# Patient Record
Sex: Male | Born: 1950 | Race: Black or African American | Hispanic: No | Marital: Married | Smoking: Never smoker
Health system: Southern US, Community
[De-identification: ages and names within clinical notes are randomized; demographics above are authoritative.]

## PROBLEM LIST (undated history)

## (undated) ENCOUNTER — Encounter

## (undated) ENCOUNTER — Ambulatory Visit

## (undated) ENCOUNTER — Telehealth

## (undated) ENCOUNTER — Ambulatory Visit: Payer: Medicare (Managed Care)

## (undated) ENCOUNTER — Ambulatory Visit
Payer: Medicare (Managed Care) | Attending: Rehabilitative and Restorative Service Providers" | Primary: Rehabilitative and Restorative Service Providers"

## (undated) ENCOUNTER — Inpatient Hospital Stay

## (undated) ENCOUNTER — Ambulatory Visit: Payer: MEDICARE | Attending: Family | Primary: Family

## (undated) ENCOUNTER — Encounter: Attending: Ambulatory Care | Primary: Ambulatory Care

## (undated) ENCOUNTER — Ambulatory Visit: Payer: MEDICARE

## (undated) ENCOUNTER — Encounter: Attending: Nephrology | Primary: Nephrology

## (undated) ENCOUNTER — Encounter: Payer: MEDICARE | Attending: Surgery | Primary: Surgery

## (undated) ENCOUNTER — Ambulatory Visit: Payer: MEDICARE | Attending: Surgery | Primary: Surgery

## (undated) ENCOUNTER — Telehealth: Attending: Family Medicine | Primary: Family Medicine

## (undated) ENCOUNTER — Ambulatory Visit: Payer: MEDICARE | Attending: Nephrology | Primary: Nephrology

## (undated) ENCOUNTER — Ambulatory Visit: Payer: Medicare (Managed Care) | Attending: Nephrology | Primary: Nephrology

## (undated) ENCOUNTER — Ambulatory Visit: Attending: Family | Primary: Family

## (undated) ENCOUNTER — Other Ambulatory Visit

## (undated) ENCOUNTER — Encounter: Payer: MEDICARE | Attending: Nephrology | Primary: Nephrology

## (undated) ENCOUNTER — Telehealth: Attending: Nephrology | Primary: Nephrology

## (undated) ENCOUNTER — Ambulatory Visit: Attending: Surgery | Primary: Surgery

## (undated) DIAGNOSIS — E119 Type 2 diabetes mellitus without complications: Secondary | ICD-10-CM

## (undated) DIAGNOSIS — N289 Disorder of kidney and ureter, unspecified: Secondary | ICD-10-CM

## (undated) HISTORY — PX: AV FISTULA PLACEMENT: SHX1204

## (undated) HISTORY — PX: KIDNEY SURGERY: SHX687

---
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
## 2004-07-12 ENCOUNTER — Ambulatory Visit: Payer: Self-pay | Admitting: Internal Medicine

## 2005-12-08 ENCOUNTER — Ambulatory Visit: Payer: Self-pay | Admitting: Internal Medicine

## 2014-09-01 ENCOUNTER — Emergency Department: Payer: Self-pay | Admitting: Emergency Medicine

## 2014-09-01 LAB — BASIC METABOLIC PANEL
ANION GAP: 16 (ref 7–16)
BUN: 98 mg/dL — ABNORMAL HIGH (ref 7–18)
CALCIUM: 7.8 mg/dL — AB (ref 8.5–10.1)
CO2: 23 mmol/L (ref 21–32)
Chloride: 105 mmol/L (ref 98–107)
Creatinine: 17.64 mg/dL — ABNORMAL HIGH (ref 0.60–1.30)
GFR CALC AF AMER: 4 — AB
GFR CALC NON AF AMER: 3 — AB
GLUCOSE: 106 mg/dL — AB (ref 65–99)
OSMOLALITY: 318 (ref 275–301)
Potassium: 5.2 mmol/L — ABNORMAL HIGH (ref 3.5–5.1)
Sodium: 144 mmol/L (ref 136–145)

## 2014-09-01 LAB — CBC
HCT: 29.1 % — AB (ref 40.0–52.0)
HGB: 9.4 g/dL — ABNORMAL LOW (ref 13.0–18.0)
MCH: 31 pg (ref 26.0–34.0)
MCHC: 32.3 g/dL (ref 32.0–36.0)
MCV: 96 fL (ref 80–100)
Platelet: 183 10*3/uL (ref 150–440)
RBC: 3.03 10*6/uL — ABNORMAL LOW (ref 4.40–5.90)
RDW: 13.6 % (ref 11.5–14.5)
WBC: 11.2 10*3/uL — AB (ref 3.8–10.6)

## 2014-09-01 LAB — TROPONIN I: Troponin-I: 0.03 ng/mL

## 2015-08-08 HISTORY — PX: NEPHRECTOMY: SHX65

## 2016-01-11 NOTE — Unmapped (Signed)
Pt called to reschedule his RUS that he missed.  Pt wants to come in 01/20/16.  Pt rescheduled at the [facility] due to availability.  Letter sent to patient and dialysis center.  Lattie Haw [phone]

## 2016-12-05 HISTORY — PX: NEPHRECTOMY: SHX65

## 2016-12-29 ENCOUNTER — Emergency Department: Payer: Medicare Other

## 2016-12-29 ENCOUNTER — Encounter: Payer: Self-pay | Admitting: Emergency Medicine

## 2016-12-29 ENCOUNTER — Emergency Department
Admission: EM | Admit: 2016-12-29 | Discharge: 2016-12-29 | Disposition: A | Discharge status: Home / Self Care | Payer: Medicare Other | Attending: Emergency Medicine | Admitting: Emergency Medicine

## 2016-12-29 DIAGNOSIS — Z7902 Long term (current) use of antithrombotics/antiplatelets: Secondary | ICD-10-CM | POA: Insufficient documentation

## 2016-12-29 DIAGNOSIS — R1084 Generalized abdominal pain: Secondary | ICD-10-CM | POA: Diagnosis not present

## 2016-12-29 DIAGNOSIS — Z905 Acquired absence of kidney: Secondary | ICD-10-CM | POA: Diagnosis not present

## 2016-12-29 DIAGNOSIS — E119 Type 2 diabetes mellitus without complications: Secondary | ICD-10-CM | POA: Diagnosis not present

## 2016-12-29 DIAGNOSIS — R079 Chest pain, unspecified: Secondary | ICD-10-CM | POA: Diagnosis not present

## 2016-12-29 DIAGNOSIS — R0789 Other chest pain: Secondary | ICD-10-CM

## 2016-12-29 HISTORY — DX: Disorder of kidney and ureter, unspecified: N28.9

## 2016-12-29 HISTORY — DX: Type 2 diabetes mellitus without complications: E11.9

## 2016-12-29 LAB — BASIC METABOLIC PANEL
Anion gap: 11 (ref 5–15)
BUN: 35 mg/dL — AB (ref 6–20)
CO2: 25 mmol/L (ref 22–32)
CREATININE: 8.2 mg/dL — AB (ref 0.61–1.24)
Calcium: 8.3 mg/dL — ABNORMAL LOW (ref 8.9–10.3)
Chloride: 98 mmol/L — ABNORMAL LOW (ref 101–111)
GFR calc Af Amer: 7 mL/min — ABNORMAL LOW (ref >60)
GFR, EST NON AFRICAN AMERICAN: 6 mL/min — AB (ref >60)
GLUCOSE: 157 mg/dL — AB (ref 65–99)
Potassium: 5.4 mmol/L — ABNORMAL HIGH (ref 3.5–5.1)
SODIUM: 134 mmol/L — AB (ref 135–145)

## 2016-12-29 LAB — CBC
HCT: 40.4 % (ref 40.0–52.0)
Hemoglobin: 13.4 g/dL (ref 13.0–18.0)
MCH: 33.6 pg (ref 26.0–34.0)
MCHC: 33.2 g/dL (ref 32.0–36.0)
MCV: 101.2 fL — AB (ref 80.0–100.0)
PLATELETS: 225 10*3/uL (ref 150–440)
RBC: 3.99 MIL/uL — ABNORMAL LOW (ref 4.40–5.90)
RDW: 14.5 % (ref 11.5–14.5)
WBC: 17.4 10*3/uL — ABNORMAL HIGH (ref 3.8–10.6)

## 2016-12-29 LAB — TROPONIN I
TROPONIN I: 0.03 ng/mL — AB (ref <0.03)
Troponin I: 0.03 ng/mL (ref <0.03)

## 2016-12-29 MED ORDER — ACETAMINOPHEN 500 MG PO TABS
ORAL_TABLET | ORAL | Status: AC
  Administered 2016-12-29: 1000 mg
  Filled 2016-12-29: qty 2

## 2016-12-29 MED ORDER — SODIUM CHLORIDE 0.9 % IV BOLUS (SEPSIS)
250.0000 mL | Freq: Once | INTRAVENOUS | Status: AC
  Administered 2016-12-29: 250 mL via INTRAVENOUS

## 2016-12-29 NOTE — ED Triage Notes (Signed)
Pt arrives via [facility] from dialysis. Pt was laid flat at one point during the procedure and felt some chest pressure. Pt denies any pain at this time. Pt also reports that he had his right [facility] and his left one was removed prior. Pt is alert and oriented x4 at this time with no cardiac symptoms.

## 2016-12-29 NOTE — ED Notes (Signed)
Green tube recollected

## 2016-12-29 NOTE — ED Notes (Signed)
Pt discharged home after verbalizing understanding of discharge instructions; nad noted. 

## 2016-12-29 NOTE — ED Provider Notes (Signed)
[facility] Emergency Department Provider Note  ____________________________________________   First MD Initiated Contact with Patient 12/29/16 0710     (approximate)  I have reviewed the triage vital signs and the nursing notes.   HISTORY  Chief Complaint Chest Pain   HPI Travis Saunders is a 66 y.o. male who comes for evaluation as he had a feeling of discomfort over his left chest that lasts about 20-30 minutes during his dialysis today.  Reports it is somewhat hard to describe feeling. He also reports he thought maybe it was because he had "a lot of fluid removed" last few days. He had surgery on Wednesday to have his right kidney removed due to concerns of cancer. He's been on dialysis for over 3 years, and this week he reports he's had for dialysis sessions due to his surgery. He does not make any urine.  He denies any fevers or chills. No shortness of breath. Denies any cough nausea vomiting. He does have discomfort over his right abdomen, but reports he expected to have pain in this area and he describes as being "sore". He has pain medicine at home, but reports he has not had to take any and does not wish for any medicine for pain or discomfort at this time.  Denies any ongoing symptoms of pain. No ongoing chest pain. He can't really describe it well but describes as a mild discomfort that went away. Denies any heavy chest pressure. No sharp chest pain. No shortness of breath   Past Medical History:  Diagnosis Date  . Diabetes mellitus without complication (HCC)   . Renal disorder     There are no active problems to display for this patient.   Past Surgical History:  Procedure Laterality Date  . KIDNEY SURGERY Bilateral     Prior to Admission medications   Medication Sig Start Date End Date Taking? Authorizing Provider  allopurinol (ZYLOPRIM) 100 MG tablet Take 100 mg by mouth daily. 10/31/16  Yes [provider]  midodrine  (PROAMATINE) 10 MG tablet Take 5 mg by mouth daily. 11/24/16  Yes [provider]  omeprazole (PRILOSEC) 40 MG capsule Take 40 mg by mouth daily. 12/26/16  Yes [provider]  oxyCODONE (OXY IR/ROXICODONE) 5 MG immediate release tablet Take 5 mg by mouth every 4 (four) hours as needed for pain. 12/28/16  Yes [provider]  pravastatin (PRAVACHOL) 40 MG tablet Take 40 mg by mouth daily. 10/31/16  Yes [provider]    Allergies Patient has no known allergies.  No family history on file.  Social History Social History  Substance Use Topics  . Smoking status: Not on file  . Smokeless tobacco: Not on file  . Alcohol use Not on file    Review of Systems Constitutional: No fever/chills Eyes: No visual changes. ENT: No sore throat. Cardiovascular: See history of present illness Respiratory: Denies shortness of breath. Gastrointestinal: See history of present illness  No nausea, no vomiting.  No diarrhea.  No constipation. Genitourinary: Does not make any urine Musculoskeletal: Negative for back pain. Skin: Negative for rash. Neurological: Negative for headaches, focal weakness or numbness.  10-point ROS otherwise negative.  ____________________________________________   PHYSICAL EXAM:  VITAL SIGNS: ED Triage Vitals  Enc Vitals Group     BP 12/29/16 0632 (!) 70/40     Pulse Rate 12/29/16 0632 (!) 106     Resp 12/29/16 0632 17     Temp 12/29/16 0632 98.5 F (36.9  C)     Temp Source 12/29/16 0632 Oral     SpO2 12/29/16 0632 95 %     Weight 12/29/16 0628 170 lb (77.1 kg)     Height 12/29/16 0628 5\' 6"  (1.676 m)     Head Circumference --      Peak Flow --      Pain Score 12/29/16 0628 0     Pain Loc --      Pain Edu? --      Excl. in GC? --     Constitutional: Alert and oriented. Well appearing and in no acute distress.He and his family members are all very pleasant. Eyes: Conjunctivae are normal. PERRL. EOMI. Head:  Atraumatic. Nose: No congestion/rhinnorhea. Mouth/Throat: Mucous membranes are moist.  Oropharynx non-erythematous. Neck: No stridor.   Cardiovascular: Normal rate, regular rhythm. Grossly normal heart sounds.  Good peripheral circulation. Respiratory: Normal respiratory effort.  No retractions. Lungs CTAB. Gastrointestinal: Soft and nontender except for tenderness along the right mid flank where he has a small about 1-1/2 inch incision that appears clean dry and intact. He has minimal discomfort to palpation of the left side, but reports moderate discomfort to palpation in the right flank. No focal right lower quadrant tenderness. No distention. His old midline surgical incision is well-healed. The new incision over the right mid abdomen appears clean dry intact without any erythema. No purulence. Musculoskeletal: No lower extremity tenderness nor edema.   Neurologic:  Normal speech and language. No gross focal neurologic deficits are appreciated. Skin:  Skin is warm, dry and intact. No rash noted. Psychiatric: Mood and affect are normal. Speech and behavior are normal.  ____________________________________________   LABS (all labs ordered are listed, but only abnormal results are displayed)  Labs Reviewed  CBC - Abnormal; Notable for the following:       Result Value   WBC 17.4 (*)    RBC 3.99 (*)    MCV 101.2 (*)    All other components within normal limits  BASIC METABOLIC PANEL - Abnormal; Notable for the following:    Sodium 134 (*)    Potassium 5.4 (*)    Chloride 98 (*)    Glucose, Bld 157 (*)    BUN 35 (*)    Creatinine, Ser 8.20 (*)    Calcium 8.3 (*)    GFR calc non Af Amer 6 (*)    GFR calc Af Amer 7 (*)    All other components within normal limits  TROPONIN I - Abnormal; Notable for the following:    Troponin I 0.03 (*)    All other components within normal limits  TROPONIN I - Abnormal; Notable for the following:    Troponin I 0.03 (*)    All other components  within normal limits   ____________________________________________  EKG    EKG reviewed and interpreted me at 7 AM Heart rate 110 QRS 95 QTC 460 Sinus tachycardia, some artifact with a slight nonspecific T-wave abnormality noted throughout, Relatively low voltage  No old EKG for comparison. There is no obvious evidence of ischemia noted on this 12-lead, but it is not noted as normal either has a unusual baseline/mild T-wave abnormalities throughout ____________________________________________  RADIOLOGY  Ct Abdomen Pelvis Wo Contrast  Result Date: 12/29/2016 CLINICAL DATA:  Right-sided abdominal pain. EXAM: CT ABDOMEN AND PELVIS WITHOUT CONTRAST TECHNIQUE: Multidetector CT imaging of the abdomen and pelvis was performed following the standard protocol without IV contrast. COMPARISON:  None. FINDINGS: Lower chest: Bilateral posterior basilar  subsegmental atelectasis is noted. Hepatobiliary: No focal liver abnormality is seen. No gallstones, gallbladder wall thickening, or biliary dilatation. Pancreas: Unremarkable. No pancreatic ductal dilatation or surrounding inflammatory changes. Spleen: Spleen appears to be small with surgical staples next to it. Adrenals/Urinary Tract: Adrenal glands appear normal. Status post bilateral nephrectomy. This reportedly occurred 2 days previously on the right. Urinary bladder is decompressed. There is noted pneumoperitoneum in the epigastric region and around the liver as well as in the posterior portion of the right renal fossa. Soft tissue gas is also noted in the subcutaneous tissues of the right lateral abdominal wall, right flank and bilateral inguinal regions. Stomach/Bowel: Stomach is within normal limits. Appendix appears normal. No evidence of bowel wall thickening, distention, or inflammatory changes. Vascular/Lymphatic: 22 x 13 mm lymph node is noted in porta hepatis region. Aortic atherosclerosis is noted without aneurysm formation. Reproductive:  Prostate is unremarkable. Other: No abnormal fluid collection is noted. No definite hernia is noted. Musculoskeletal: 2.3 cm sclerotic density is noted in the L3 vertebral body. Metastatic disease cannot be excluded. IMPRESSION: 2.3 cm sclerotic density seen in L3 vertebral body. MRI of the lumbar spine or bone scan is recommended evaluate for possible metastatic disease. 13 mm lymph node is noted in porta hepatis region which may be inflammatory in etiology. Aortic atherosclerosis. Status post bilateral nephrectomy, with the right-sided procedure being performed only 2 days ago. There is noted pneumoperitoneum in the epigastric region as well as around the liver anteriorly, as well as gas in right renal fossa and subcutaneous tissues of the right lateral abdominal wall right posterior flank and bilateral inguinal regions which most likely is postoperative in etiology. Electronically Signed   By: Lupita Raider, M.D.   On: 12/29/2016 09:06   Dg Chest 2 View  Result Date: 12/29/2016 CLINICAL DATA:  Chest pain. Status post left nephrectomy 2 days ago. EXAM: CHEST  2 VIEW COMPARISON:  Radiograph of September 01, 2014. FINDINGS: Stable cardiomegaly. No pneumothorax is noted. Stable bibasilar scarring or subsegmental atelectasis. No significant pleural effusion is noted. There is interval development of pneumoperitoneum underneath the right hemidiaphragm. This may be due to recent surgery. Bony thorax is unremarkable. IMPRESSION: Stable bibasilar scarring or subsegmental atelectasis. Interval development of pneumoperitoneum underneath right hemidiaphragm which may be due to recent surgery, but rupture of hollow viscus cannot be excluded. Critical Value/emergent results were called by telephone at the time of interpretation on 12/29/2016 at 7:34 am to Dr. Sharyn Creamer , who verbally acknowledged these results. Electronically Signed   By: Lupita Raider, M.D.   On: 12/29/2016 07:34     ____________________________________________   PROCEDURES  Procedure(s) performed: None  Procedures  Critical Care performed: No  ____________________________________________   INITIAL IMPRESSION / ASSESSMENT AND PLAN / ED COURSE  Pertinent labs & imaging results that were available during my care of the patient were reviewed by me and considered in my medical decision making (see chart for details).    Clinical Course as of Dec 29 1200  Fri Dec 29, 2016  [id number] Called and reviewed case with [facility] (Dr. Nunzio Cory).   [MQ]  [id number] Dr. Nunzio Cory noted that patient's blood pressure in the 70s to 80s systolic is not uncommon, and he runs a very low baseline blood pressure. This is normal for this patient.  [MQ]    Clinical Course User Index [MQ] Sharyn Creamer, MD   Discussed case with Dr. Nunzio Cory and reviewed the patient's CT scan as well as laboratory  analysis and workup today. After discussing presentation, Dr. Nunzio Cory advises that it sounds as though things are stable postoperatively. He would not recommend any further workup at this time, and will call the patient himself tomorrow to check on him. His advises it would be okay to discharge and they will follow up closely. We also discussed the L3 lesion noted, and Dr. Nunzio Cory asked to have images sent to [facility] which I have asked our radiology staff today. The patient is agreeable with plan for close follow-up and return precautions. I will continue to check a second troponin is his first was minimally elevated, though this is not uncommonly seen in our dialysis patients. He has no further ongoing chest discomfort  ----------------------------------------- 9:33 AM on 12/29/2016 -----------------------------------------   OBSERVATION CARE: This patient is being placed under observation care for the following reasons: Chest pain with repeat testing to rule out ischemia  Patient being observed for chest discomfort which is now gone away. First  troponin was minimally elevated, but given dialysis status we will observe him and check a second troponin as it is not a common have a slightly elevated troponin the setting of hemodialysis.  ----------------------------------------- 1159 AM on 12/29/2016 -----------------------------------------   END OF OBSERVATION STATUS: After an appropriate period of observation, this patient is being discharged due to the following reason(s):  Clinically stable, stable troponin. No ongoing chest discomfort or concerns while in the emergency room.     ____________________________________________   FINAL CLINICAL IMPRESSION(S) / ED DIAGNOSES  Final diagnoses:  Chest discomfort      NEW MEDICATIONS STARTED DURING THIS VISIT:  New Prescriptions   No medications on file     Note:  This document was prepared using Dragon voice recognition software and may include unintentional dictation errors.     Sharyn Creamer, MD 12/29/16 [phone]

## 2016-12-29 NOTE — ED Notes (Signed)
Pt has extensive Hx of hypotension. Pt states that his BP is normal at this time. Pt does not appear pale or have any hypotension symptoms at this time. MD Roxan Hockeyobinson made aware.

## 2016-12-29 NOTE — ED Notes (Signed)
Patient transported to CT 

## 2016-12-29 NOTE — Discharge Instructions (Signed)
Return to the Emergency Department (ED) if you experience any further chest pain/pressure/tightness, difficulty breathing, or sudden sweating, or other symptoms that concern you. ° °

## 2017-02-08 ENCOUNTER — Ambulatory Visit
Admission: RE | Admit: 2017-02-08 | Discharge: 2017-02-08 | Disposition: A | Discharge status: Home / Self Care | Payer: MEDICARE | Attending: Urology | Admitting: Urology

## 2017-02-08 DIAGNOSIS — Z905 Acquired absence of kidney: Principal | ICD-10-CM

## 2017-02-21 MED ORDER — MIDODRINE 10 MG TABLET
ORAL_TABLET | 11 refills | 0 days | Status: CP
Start: 2017-02-21 — End: ?

## 2017-02-23 NOTE — Discharge Instructions (Signed)

## 2017-02-27 ENCOUNTER — Ambulatory Visit: Payer: Medicare Other | Admitting: Anesthesiology

## 2017-02-27 ENCOUNTER — Ambulatory Visit
Admission: RE | Admit: 2017-02-27 | Discharge: 2017-02-27 | Disposition: A | Discharge status: Home / Self Care | Payer: Medicare Other | Source: Ambulatory Visit | Attending: Ophthalmology | Admitting: Ophthalmology

## 2017-02-27 ENCOUNTER — Encounter
Admission: RE | Disposition: A | Discharge status: Home / Self Care | Payer: Self-pay | Source: Ambulatory Visit | Attending: Ophthalmology

## 2017-02-27 DIAGNOSIS — Z85528 Personal history of other malignant neoplasm of kidney: Secondary | ICD-10-CM | POA: Diagnosis not present

## 2017-02-27 DIAGNOSIS — E1136 Type 2 diabetes mellitus with diabetic cataract: Secondary | ICD-10-CM | POA: Insufficient documentation

## 2017-02-27 DIAGNOSIS — H2181 Floppy iris syndrome: Secondary | ICD-10-CM | POA: Insufficient documentation

## 2017-02-27 DIAGNOSIS — H2511 Age-related nuclear cataract, right eye: Secondary | ICD-10-CM | POA: Insufficient documentation

## 2017-02-27 DIAGNOSIS — Z79899 Other long term (current) drug therapy: Secondary | ICD-10-CM | POA: Diagnosis not present

## 2017-02-27 DIAGNOSIS — Z905 Acquired absence of kidney: Secondary | ICD-10-CM | POA: Diagnosis not present

## 2017-02-27 DIAGNOSIS — E1122 Type 2 diabetes mellitus with diabetic chronic kidney disease: Secondary | ICD-10-CM | POA: Diagnosis not present

## 2017-02-27 DIAGNOSIS — Z992 Dependence on renal dialysis: Secondary | ICD-10-CM | POA: Diagnosis not present

## 2017-02-27 DIAGNOSIS — N186 End stage renal disease: Secondary | ICD-10-CM | POA: Diagnosis not present

## 2017-02-27 HISTORY — PX: CATARACT EXTRACTION W/PHACO: SHX586

## 2017-02-27 SURGERY — PHACOEMULSIFICATION, CATARACT, WITH IOL INSERTION
Anesthesia: Monitor Anesthesia Care | Laterality: Right | Wound class: Clean

## 2017-02-27 MED ORDER — MOXIFLOXACIN HCL 0.5 % OP SOLN
OPHTHALMIC | Status: DC | PRN
  Administered 2017-02-27: 0.2 mL via OPHTHALMIC

## 2017-02-27 MED ORDER — FENTANYL CITRATE (PF) 100 MCG/2ML IJ SOLN
INTRAMUSCULAR | Status: DC | PRN
  Administered 2017-02-27 (×2): 50 ug via INTRAVENOUS

## 2017-02-27 MED ORDER — SODIUM HYALURONATE 10 MG/ML IO SOLN
INTRAOCULAR | Status: DC | PRN
  Administered 2017-02-27: 0.55 mL via INTRAOCULAR

## 2017-02-27 MED ORDER — EPINEPHRINE PF 1 MG/ML IJ SOLN
INTRAOCULAR | Status: DC | PRN
  Administered 2017-02-27: 80 mL via OPHTHALMIC

## 2017-02-27 MED ORDER — ARMC OPHTHALMIC DILATING DROPS
1.0000 "application " | OPHTHALMIC | Status: DC | PRN
  Administered 2017-02-27 (×3): 1 via OPHTHALMIC

## 2017-02-27 MED ORDER — LACTATED RINGERS IV SOLN: INTRAVENOUS | Status: DC

## 2017-02-27 MED ORDER — EPHEDRINE SULFATE 50 MG/ML IJ SOLN
INTRAMUSCULAR | Status: DC | PRN
  Administered 2017-02-27: 10 mg via INTRAVENOUS
  Administered 2017-02-27: 5 mg via INTRAVENOUS
  Administered 2017-02-27: 10 mg via INTRAVENOUS
  Administered 2017-02-27: 5 mg via INTRAVENOUS
  Administered 2017-02-27: 10 mg via INTRAVENOUS

## 2017-02-27 MED ORDER — SODIUM HYALURONATE 23 MG/ML IO SOLN
INTRAOCULAR | Status: DC | PRN
  Administered 2017-02-27: 0.6 mL via INTRAOCULAR

## 2017-02-27 MED ORDER — OXYCODONE HCL 5 MG/5ML PO SOLN: 5.0000 mg | Freq: Once | ORAL | Status: DC | PRN

## 2017-02-27 MED ORDER — MIDAZOLAM HCL 2 MG/2ML IJ SOLN
INTRAMUSCULAR | Status: DC | PRN
  Administered 2017-02-27 (×2): 1 mg via INTRAVENOUS

## 2017-02-27 MED ORDER — LIDOCAINE HCL (PF) 2 % IJ SOLN
INTRAOCULAR | Status: DC | PRN
  Administered 2017-02-27: 1 mL via INTRAOCULAR

## 2017-02-27 MED ORDER — OXYCODONE HCL 5 MG PO TABS: 5.0000 mg | ORAL_TABLET | Freq: Once | ORAL | Status: DC | PRN

## 2017-02-27 SURGICAL SUPPLY — 17 items
CANNULA ANT/CHMB 27GA (MISCELLANEOUS) ×3 IMPLANT
DISSECTOR HYDRO NUCLEUS 50X22 (MISCELLANEOUS) ×3 IMPLANT
GLOVE BIO SURGEON STRL SZ8 (GLOVE) ×3 IMPLANT
GLOVE SURG LX 7.5 STRW (GLOVE) ×2
GLOVE SURG LX STRL 7.5 STRW (GLOVE) ×1 IMPLANT
GOWN STRL REUS W/ TWL LRG LVL3 (GOWN DISPOSABLE) ×2 IMPLANT
GOWN STRL REUS W/TWL LRG LVL3 (GOWN DISPOSABLE) ×4
LENS IOL TECNIS ITEC 21.0 (Intraocular Lens) ×3 IMPLANT
MARKER SKIN DUAL TIP RULER LAB (MISCELLANEOUS) ×3 IMPLANT
PACK CATARACT (MISCELLANEOUS) ×3 IMPLANT
PACK DR. KING ARMS (PACKS) ×3 IMPLANT
PACK EYE AFTER SURG (MISCELLANEOUS) ×3 IMPLANT
RING MALYGIN 7.0 (MISCELLANEOUS) ×3 IMPLANT
SYR 3ML LL SCALE MARK (SYRINGE) ×3 IMPLANT
SYR TB 1ML LUER SLIP (SYRINGE) ×3 IMPLANT
WATER STERILE IRR 500ML POUR (IV SOLUTION) ×3 IMPLANT
WIPE NON LINTING 3.25X3.25 (MISCELLANEOUS) ×3 IMPLANT

## 2017-02-27 NOTE — Op Note (Signed)
OPERATIVE NOTE  Delia ChimesCastadarl P Lindamood [id number] 02/27/2017   PREOPERATIVE DIAGNOSIS:  Nuclear sclerotic cataract right eye.  H25.11   POSTOPERATIVE DIAGNOSIS:    1.   Nuclear sclerotic cataract right eye.   2.   Intraoperative floppy iris syndrome.   PROCEDURE:   1.  Complex Phacoemusification with posterior chamber intraocular lens placement of the right eye, requiring use of a malyugin ring for pupillary dilation.  CPT [id number].    LENS:   Implant Name Type Inv. Item Serial No. Manufacturer Lot No. LRB No. Used  LENS IOL DIOP 21.0 - [id number] Intraocular Lens LENS IOL DIOP 21.0 [id number] AMO   Right 1       PCB00 +21.0   ULTRASOUND TIME: 0 minutes 12.9 seconds.  CDE 1.25   SURGEON:  Willey BladeBradley King, MD, MPH  ANESTHESIOLOGIST: Anesthesiologist: Jolayne Pantherunkle, Richard, MD CRNA: Maryan RuedWilson, Jennifer M, CRNA   ANESTHESIA:  Topical with tetracaine drops augmented with 1% preservative-free intracameral lidocaine.  ESTIMATED BLOOD LOSS: less than 1 mL.   COMPLICATIONS:  None.   DESCRIPTION OF PROCEDURE:  The patient was identified in the holding room and transported to the operating room and placed in the supine position under the operating microscope.  The right eye was identified as the operative eye and it was prepped and draped in the usual sterile ophthalmic fashion.   A 1.0 millimeter clear-corneal paracentesis was made at the 10:30 position. 0.5 ml of preservative-free 1% lidocaine with epinephrine was injected into the anterior chamber.  The anterior chamber was filled with Healon 5 viscoelastic.    A 7.0 mm malyugin ring was placed with a kuglen hook.  A 2.4 millimeter keratome was used to make a near-clear corneal incision at the 8:00 position.  A curvilinear capsulorrhexis was made with a cystotome and capsulorrhexis forceps.  Balanced salt solution was used to hydrodissect and hydrodelineate the nucleus.   Phacoemulsification was then used in stop and chop fashion to remove the lens  nucleus and epinucleus.  The remaining cortex was then removed using the irrigation and aspiration handpiece. Healon was then placed into the capsular bag to distend it for lens placement.  A lens was then injected into the capsular bag.  The malyugin ring was disinserted with the kuglen hook and removed with the ring inserter.  The remaining viscoelastic was aspirated.   Wounds were hydrated with balanced salt solution.  The anterior chamber was inflated to a physiologic pressure with balanced salt solution.   Intracameral vigamox 0.1 mL undiluted was injected into the eye and a drop placed onto the ocular surface.  No wound leaks were noted.  The patient was taken to the recovery room in stable condition without complications of anesthesia or surgery  Willey BladeBradley King 02/27/2017, 8:13 AM

## 2017-02-27 NOTE — Anesthesia Preprocedure Evaluation (Addendum)
Anesthesia Evaluation  Patient identified by MRN, date of birth, ID band Patient awake    Reviewed: Allergy & Precautions, H&P , NPO status , Patient's Chart, lab work & pertinent test results  Airway Mallampati: II  TM Distance: >3 FB Neck ROM: full    Dental no notable dental hx.    Pulmonary neg pulmonary ROS,    Pulmonary exam normal        Cardiovascular negative cardio ROS Normal cardiovascular exam     Neuro/Psych negative neurological ROS     GI/Hepatic negative GI ROS, Neg liver ROS,   Endo/Other  diabetes, Well Controlled, Type 2  Renal/GU ESRF and Dialysis  negative genitourinary   Musculoskeletal   Abdominal   Peds  Hematology negative hematology ROS (+)   Anesthesia Other Findings   Reproductive/Obstetrics negative OB ROS                            Anesthesia Physical Anesthesia Plan  ASA: II  Anesthesia Plan: MAC   Post-op Pain Management:    Induction:   PONV Risk Score and Plan:   Airway Management Planned:   Additional Equipment:   Intra-op Plan:   Post-operative Plan:   Informed Consent: I have reviewed the patients History and Physical, chart, labs and discussed the procedure including the risks, benefits and alternatives for the proposed anesthesia with the patient or authorized representative who has indicated his/her understanding and acceptance.     Plan Discussed with:   Anesthesia Plan Comments:         Anesthesia Quick Evaluation

## 2017-02-27 NOTE — Anesthesia Postprocedure Evaluation (Signed)
Anesthesia Post Note  Patient: Travis Saunders  Procedure(s) Performed: Procedure(s) (LRB): CATARACT EXTRACTION PHACO AND INTRAOCULAR LENS PLACEMENT (IOC) Right Diabetic (Right)  Patient location during evaluation: PACU Anesthesia Type: MAC Level of consciousness: awake and alert Pain management: pain level controlled Vital Signs Assessment: post-procedure vital signs reviewed and stable Respiratory status: spontaneous breathing Cardiovascular status: blood pressure returned to baseline Postop Assessment: no headache Anesthetic complications: no    Jaci Standard, III,  Assata Juncaj D

## 2017-02-27 NOTE — H&P (Signed)
The History and Physical notes are on paper, have been signed, and are to be scanned.   I have examined the patient and there are no changes to the H&P.   Willey BladeBradley King 02/27/2017 7:15 AM

## 2017-02-27 NOTE — Anesthesia Procedure Notes (Signed)
Procedure Name: MAC Date/Time: 02/27/2017 7:32 AM Performed by: Janna Arch Pre-anesthesia Checklist: Patient identified, Emergency Drugs available, Suction available and Patient being monitored Patient Re-evaluated:Patient Re-evaluated prior to induction Oxygen Delivery Method: Nasal cannula

## 2017-02-27 NOTE — Transfer of Care (Signed)
Immediate Anesthesia Transfer of Care Note  Patient: Travis Saunders  Procedure(s) Performed: Procedure(s) with comments: CATARACT EXTRACTION PHACO AND INTRAOCULAR LENS PLACEMENT (IOC) Right Diabetic (Right) - diabetic-diet controlled  Patient Location: PACU  Anesthesia Type: MAC  Level of Consciousness: awake, alert  and patient cooperative  Airway and Oxygen Therapy: Patient Spontanous Breathing and Patient connected to supplemental oxygen  Post-op Assessment: Post-op Vital signs reviewed, Patient's Cardiovascular Status Stable, Respiratory Function Stable, Patent Airway and No signs of Nausea or vomiting  Post-op Vital Signs: Reviewed and stable  Complications: No apparent anesthesia complications

## 2017-02-28 ENCOUNTER — Encounter: Payer: Self-pay | Admitting: Ophthalmology

## 2017-03-06 ENCOUNTER — Ambulatory Visit: Admission: RE | Admit: 2017-03-06 | Discharge: 2017-03-06 | Payer: MEDICARE

## 2017-03-27 ENCOUNTER — Ambulatory Visit
Admission: RE | Admit: 2017-03-27 | Discharge: 2017-03-27 | Disposition: A | Discharge status: Home / Self Care | Payer: MEDICARE

## 2017-03-27 DIAGNOSIS — N186 End stage renal disease: Secondary | ICD-10-CM

## 2017-03-27 DIAGNOSIS — Z01810 Encounter for preprocedural cardiovascular examination: Secondary | ICD-10-CM

## 2017-03-27 DIAGNOSIS — Z01818 Encounter for other preprocedural examination: Principal | ICD-10-CM

## 2017-03-27 DIAGNOSIS — C649 Malignant neoplasm of unspecified kidney, except renal pelvis: Secondary | ICD-10-CM

## 2017-03-27 DIAGNOSIS — C642 Malignant neoplasm of left kidney, except renal pelvis: Secondary | ICD-10-CM

## 2017-03-27 DIAGNOSIS — E1121 Type 2 diabetes mellitus with diabetic nephropathy: Secondary | ICD-10-CM

## 2017-04-05 ENCOUNTER — Ambulatory Visit
Admission: RE | Admit: 2017-04-05 | Discharge: 2017-04-05 | Disposition: A | Discharge status: Home / Self Care | Payer: MEDICARE

## 2017-04-05 ENCOUNTER — Ambulatory Visit
Admission: RE | Admit: 2017-04-05 | Discharge: 2017-04-18 | Disposition: A | Discharge status: Home / Self Care | Payer: MEDICARE

## 2017-04-05 ENCOUNTER — Ambulatory Visit
Admission: RE | Admit: 2017-04-05 | Discharge: 2017-04-05 | Disposition: A | Discharge status: Home / Self Care | Admitting: Surgery

## 2017-04-05 DIAGNOSIS — Z01818 Encounter for other preprocedural examination: Principal | ICD-10-CM

## 2017-04-05 DIAGNOSIS — Z125 Encounter for screening for malignant neoplasm of prostate: Secondary | ICD-10-CM

## 2017-04-05 DIAGNOSIS — Z114 Encounter for screening for human immunodeficiency virus [HIV]: Secondary | ICD-10-CM

## 2017-04-05 DIAGNOSIS — Z01810 Encounter for preprocedural cardiovascular examination: Principal | ICD-10-CM

## 2017-04-05 DIAGNOSIS — N186 End stage renal disease: Secondary | ICD-10-CM

## 2017-04-05 DIAGNOSIS — N2889 Other specified disorders of kidney and ureter: Principal | ICD-10-CM

## 2017-04-05 DIAGNOSIS — Z7289 Other problems related to lifestyle: Secondary | ICD-10-CM

## 2017-04-05 DIAGNOSIS — Z85528 Personal history of other malignant neoplasm of kidney: Secondary | ICD-10-CM

## 2017-04-06 ENCOUNTER — Ambulatory Visit: Admission: RE | Admit: 2017-04-06 | Discharge: 2017-04-06 | Payer: MEDICARE

## 2017-04-26 ENCOUNTER — Ambulatory Visit: Admission: RE | Admit: 2017-04-26 | Discharge: 2017-04-26 | Disposition: A | Discharge status: Home / Self Care

## 2017-04-26 DIAGNOSIS — Z01818 Encounter for other preprocedural examination: Principal | ICD-10-CM

## 2017-04-26 DIAGNOSIS — Z85528 Personal history of other malignant neoplasm of kidney: Secondary | ICD-10-CM

## 2017-05-06 ENCOUNTER — Ambulatory Visit: Admission: RE | Admit: 2017-05-06 | Discharge: 2017-05-06 | Payer: MEDICARE

## 2017-06-06 ENCOUNTER — Ambulatory Visit: Admission: RE | Admit: 2017-06-06 | Discharge: 2017-06-06 | Payer: MEDICARE

## 2017-06-13 MED ORDER — FERRIC CITRATE 210 MG IRON TABLET
ORAL_TABLET | Freq: Three times a day (TID) | ORAL | 11 refills | 0.00000 days | Status: CP
Start: 2017-06-13 — End: ?

## 2017-06-25 ENCOUNTER — Ambulatory Visit: Admission: RE | Admit: 2017-06-25 | Discharge: 2017-06-25 | Disposition: A | Discharge status: Home / Self Care

## 2017-07-03 DIAGNOSIS — N186 End stage renal disease: Secondary | ICD-10-CM

## 2017-07-03 DIAGNOSIS — Z7682 Awaiting organ transplant status: Principal | ICD-10-CM

## 2017-07-03 DIAGNOSIS — Z01818 Encounter for other preprocedural examination: Secondary | ICD-10-CM

## 2017-07-06 ENCOUNTER — Ambulatory Visit: Admission: RE | Admit: 2017-07-06 | Discharge: 2017-07-06 | Payer: MEDICARE

## 2017-07-24 ENCOUNTER — Ambulatory Visit
Admission: RE | Admit: 2017-07-24 | Discharge: 2017-07-24 | Disposition: A | Discharge status: Home / Self Care | Payer: MEDICARE | Attending: Infectious Disease | Admitting: Infectious Disease

## 2017-07-24 DIAGNOSIS — Z01818 Encounter for other preprocedural examination: Principal | ICD-10-CM

## 2017-07-24 DIAGNOSIS — Z9081 Acquired absence of spleen: Secondary | ICD-10-CM

## 2017-07-24 DIAGNOSIS — R768 Other specified abnormal immunological findings in serum: Secondary | ICD-10-CM

## 2017-07-24 MED ORDER — MENINGOCOCCAL B VAC,4-CMP 50 MCG-50 MCG-50 MCG-25 MCG/0.5ML IM SYRINGE
Freq: Once | INTRAMUSCULAR | 0 refills | 0.00000 days | Status: CP
Start: 2017-07-24 — End: 2017-07-24

## 2017-08-06 ENCOUNTER — Ambulatory Visit: Admission: RE | Admit: 2017-08-06 | Discharge: 2017-08-06 | Payer: MEDICARE

## 2017-08-14 ENCOUNTER — Institutional Professional Consult (permissible substitution): Admit: 2017-08-14 | Discharge: 2017-08-15 | Payer: MEDICARE

## 2017-08-14 DIAGNOSIS — K279 Peptic ulcer, site unspecified, unspecified as acute or chronic, without hemorrhage or perforation: Principal | ICD-10-CM

## 2017-08-24 ENCOUNTER — Encounter: Admit: 2017-08-24 | Discharge: 2017-08-24 | Payer: MEDICARE

## 2017-08-31 DIAGNOSIS — N186 End stage renal disease: Secondary | ICD-10-CM

## 2017-08-31 DIAGNOSIS — Z01818 Encounter for other preprocedural examination: Secondary | ICD-10-CM

## 2017-08-31 DIAGNOSIS — Z7682 Awaiting organ transplant status: Principal | ICD-10-CM

## 2017-09-06 ENCOUNTER — Ambulatory Visit: Admit: 2017-09-06 | Discharge: 2017-09-07 | Payer: MEDICARE

## 2017-09-26 ENCOUNTER — Encounter: Admit: 2017-09-26 | Discharge: 2017-09-26 | Payer: MEDICARE

## 2017-10-02 DIAGNOSIS — N186 End stage renal disease: Secondary | ICD-10-CM

## 2017-10-02 DIAGNOSIS — Z01818 Encounter for other preprocedural examination: Secondary | ICD-10-CM

## 2017-10-02 DIAGNOSIS — Z7682 Awaiting organ transplant status: Principal | ICD-10-CM

## 2017-10-04 ENCOUNTER — Encounter: Admit: 2017-10-04 | Discharge: 2017-10-05 | Payer: MEDICARE

## 2017-10-24 ENCOUNTER — Encounter: Admit: 2017-10-24 | Discharge: 2017-10-24 | Payer: MEDICARE | Attending: Surgery | Primary: Surgery

## 2017-10-30 DIAGNOSIS — N186 End stage renal disease: Secondary | ICD-10-CM

## 2017-10-30 DIAGNOSIS — Z7682 Awaiting organ transplant status: Principal | ICD-10-CM

## 2017-10-30 DIAGNOSIS — Z01818 Encounter for other preprocedural examination: Secondary | ICD-10-CM

## 2017-11-04 ENCOUNTER — Encounter: Admit: 2017-11-04 | Discharge: 2017-11-05 | Payer: MEDICARE

## 2017-11-21 ENCOUNTER — Encounter: Admit: 2017-11-21 | Discharge: 2017-11-21 | Payer: MEDICARE

## 2017-11-28 DIAGNOSIS — Z01818 Encounter for other preprocedural examination: Secondary | ICD-10-CM

## 2017-11-28 DIAGNOSIS — N186 End stage renal disease: Secondary | ICD-10-CM

## 2017-11-28 DIAGNOSIS — Z7682 Awaiting organ transplant status: Principal | ICD-10-CM

## 2017-12-04 ENCOUNTER — Encounter: Admit: 2017-12-04 | Discharge: 2017-12-05 | Payer: MEDICARE

## 2017-12-26 DIAGNOSIS — Z01818 Encounter for other preprocedural examination: Secondary | ICD-10-CM

## 2017-12-26 DIAGNOSIS — Z7682 Awaiting organ transplant status: Principal | ICD-10-CM

## 2017-12-26 DIAGNOSIS — N186 End stage renal disease: Secondary | ICD-10-CM

## 2018-01-04 ENCOUNTER — Encounter: Admit: 2018-01-04 | Discharge: 2018-01-05 | Payer: MEDICARE

## 2018-01-23 ENCOUNTER — Encounter: Admit: 2018-01-23 | Discharge: 2018-01-23 | Payer: MEDICARE

## 2018-01-25 DIAGNOSIS — N186 End stage renal disease: Secondary | ICD-10-CM

## 2018-01-25 DIAGNOSIS — Z01818 Encounter for other preprocedural examination: Secondary | ICD-10-CM

## 2018-01-25 DIAGNOSIS — Z7682 Awaiting organ transplant status: Principal | ICD-10-CM

## 2018-01-30 ENCOUNTER — Encounter: Admit: 2018-01-30 | Discharge: 2018-01-30 | Payer: MEDICARE

## 2018-01-30 ENCOUNTER — Encounter: Admit: 2018-01-30 | Discharge: 2018-01-30 | Payer: MEDICARE | Attending: Nephrology | Primary: Nephrology

## 2018-01-30 DIAGNOSIS — A048 Other specified bacterial intestinal infections: Principal | ICD-10-CM

## 2018-01-30 DIAGNOSIS — Z01818 Encounter for other preprocedural examination: Principal | ICD-10-CM

## 2018-02-03 ENCOUNTER — Encounter: Admit: 2018-02-03 | Discharge: 2018-02-04 | Payer: MEDICARE

## 2018-02-04 ENCOUNTER — Other Ambulatory Visit: Payer: Self-pay

## 2018-02-04 DIAGNOSIS — Z1211 Encounter for screening for malignant neoplasm of colon: Secondary | ICD-10-CM

## 2018-02-11 ENCOUNTER — Telehealth: Payer: Self-pay | Admitting: Gastroenterology

## 2018-02-11 NOTE — Telephone Encounter (Signed)
Pt is calling to cancel procedure he is having it done in [place]

## 2018-02-12 NOTE — Telephone Encounter (Signed)
Canceled procedure for 02/21/18 per pt's request due to having this procedure done at [facility].

## 2018-02-21 ENCOUNTER — Ambulatory Visit: Admit: 2018-02-21 | Payer: Medicare Other | Admitting: Gastroenterology

## 2018-02-21 SURGERY — COLONOSCOPY WITH PROPOFOL
Anesthesia: General

## 2018-02-27 DIAGNOSIS — N186 End stage renal disease: Secondary | ICD-10-CM

## 2018-02-27 DIAGNOSIS — Z7682 Awaiting organ transplant status: Principal | ICD-10-CM

## 2018-02-27 DIAGNOSIS — Z01818 Encounter for other preprocedural examination: Secondary | ICD-10-CM

## 2018-03-05 ENCOUNTER — Encounter: Admit: 2018-03-05 | Discharge: 2018-03-05 | Payer: MEDICARE | Attending: Anesthesiology | Primary: Anesthesiology

## 2018-03-05 ENCOUNTER — Encounter: Admit: 2018-03-05 | Discharge: 2018-03-05 | Payer: MEDICARE

## 2018-03-06 ENCOUNTER — Encounter: Admit: 2018-03-06 | Discharge: 2018-03-07 | Payer: MEDICARE

## 2018-03-06 ENCOUNTER — Encounter: Admit: 2018-03-06 | Discharge: 2018-03-06 | Payer: MEDICARE

## 2018-03-13 DIAGNOSIS — Z7682 Awaiting organ transplant status: Principal | ICD-10-CM

## 2018-03-13 DIAGNOSIS — Z01818 Encounter for other preprocedural examination: Secondary | ICD-10-CM

## 2018-03-13 DIAGNOSIS — N186 End stage renal disease: Secondary | ICD-10-CM

## 2018-03-27 ENCOUNTER — Encounter: Admit: 2018-03-27 | Discharge: 2018-03-27 | Payer: MEDICARE

## 2018-04-02 DIAGNOSIS — Z7682 Awaiting organ transplant status: Principal | ICD-10-CM

## 2018-04-02 DIAGNOSIS — Z01818 Encounter for other preprocedural examination: Secondary | ICD-10-CM

## 2018-04-02 DIAGNOSIS — N186 End stage renal disease: Secondary | ICD-10-CM

## 2018-04-06 ENCOUNTER — Encounter: Admit: 2018-04-06 | Discharge: 2018-04-07 | Payer: MEDICARE

## 2018-04-11 ENCOUNTER — Encounter: Admit: 2018-04-11 | Discharge: 2018-04-11 | Payer: MEDICARE | Attending: Nephrology | Primary: Nephrology

## 2018-04-11 DIAGNOSIS — Z01818 Encounter for other preprocedural examination: Principal | ICD-10-CM

## 2018-04-24 ENCOUNTER — Encounter: Admit: 2018-04-24 | Discharge: 2018-04-24 | Payer: MEDICARE | Attending: Surgery | Primary: Surgery

## 2018-04-30 ENCOUNTER — Encounter: Admit: 2018-04-30 | Discharge: 2018-04-30 | Payer: MEDICARE

## 2018-04-30 ENCOUNTER — Encounter: Admit: 2018-04-30 | Discharge: 2018-04-30 | Payer: MEDICARE | Attending: Anesthesiology | Primary: Anesthesiology

## 2018-04-30 DIAGNOSIS — Z1211 Encounter for screening for malignant neoplasm of colon: Principal | ICD-10-CM

## 2018-05-03 DIAGNOSIS — N186 End stage renal disease: Secondary | ICD-10-CM

## 2018-05-03 DIAGNOSIS — Z01818 Encounter for other preprocedural examination: Secondary | ICD-10-CM

## 2018-05-03 DIAGNOSIS — Z7682 Awaiting organ transplant status: Principal | ICD-10-CM

## 2018-05-06 ENCOUNTER — Encounter: Admit: 2018-05-06 | Discharge: 2018-05-07 | Payer: MEDICARE

## 2018-05-29 ENCOUNTER — Encounter: Admit: 2018-05-29 | Discharge: 2018-05-29 | Payer: MEDICARE

## 2018-06-04 DIAGNOSIS — Z7682 Awaiting organ transplant status: Principal | ICD-10-CM

## 2018-06-04 DIAGNOSIS — Z01818 Encounter for other preprocedural examination: Secondary | ICD-10-CM

## 2018-06-04 DIAGNOSIS — N186 End stage renal disease: Secondary | ICD-10-CM

## 2018-06-26 ENCOUNTER — Encounter: Admit: 2018-06-26 | Discharge: 2018-06-26 | Payer: MEDICARE

## 2018-06-28 DIAGNOSIS — Z7682 Awaiting organ transplant status: Principal | ICD-10-CM

## 2018-06-28 DIAGNOSIS — N186 End stage renal disease: Secondary | ICD-10-CM

## 2018-06-28 DIAGNOSIS — Z01818 Encounter for other preprocedural examination: Secondary | ICD-10-CM

## 2018-07-24 ENCOUNTER — Encounter: Admit: 2018-07-24 | Discharge: 2018-07-24 | Payer: MEDICARE

## 2018-07-29 DIAGNOSIS — N186 End stage renal disease: Secondary | ICD-10-CM

## 2018-07-29 DIAGNOSIS — Z7682 Awaiting organ transplant status: Principal | ICD-10-CM

## 2018-07-29 DIAGNOSIS — Z01818 Encounter for other preprocedural examination: Secondary | ICD-10-CM

## 2018-08-06 ENCOUNTER — Encounter: Admit: 2018-08-06 | Discharge: 2018-08-07 | Payer: MEDICARE

## 2018-08-20 ENCOUNTER — Encounter: Admit: 2018-08-20 | Discharge: 2018-08-20 | Payer: MEDICARE

## 2018-08-20 ENCOUNTER — Ambulatory Visit: Admit: 2018-08-20 | Discharge: 2018-08-20 | Payer: MEDICARE

## 2018-08-20 ENCOUNTER — Institutional Professional Consult (permissible substitution): Admit: 2018-08-20 | Discharge: 2018-08-21 | Payer: MEDICARE

## 2018-08-20 DIAGNOSIS — Z01810 Encounter for preprocedural cardiovascular examination: Secondary | ICD-10-CM

## 2018-08-20 DIAGNOSIS — Z01818 Encounter for other preprocedural examination: Principal | ICD-10-CM

## 2018-08-20 DIAGNOSIS — Z992 Dependence on renal dialysis: Secondary | ICD-10-CM

## 2018-08-20 DIAGNOSIS — E1122 Type 2 diabetes mellitus with diabetic chronic kidney disease: Secondary | ICD-10-CM

## 2018-08-20 DIAGNOSIS — I120 Hypertensive chronic kidney disease with stage 5 chronic kidney disease or end stage renal disease: Secondary | ICD-10-CM

## 2018-08-20 DIAGNOSIS — Z125 Encounter for screening for malignant neoplasm of prostate: Secondary | ICD-10-CM

## 2018-08-20 DIAGNOSIS — N186 End stage renal disease: Secondary | ICD-10-CM

## 2018-08-20 DIAGNOSIS — Z7289 Other problems related to lifestyle: Secondary | ICD-10-CM

## 2018-08-20 DIAGNOSIS — Z7682 Awaiting organ transplant status: Secondary | ICD-10-CM

## 2018-08-20 DIAGNOSIS — R944 Abnormal results of kidney function studies: Secondary | ICD-10-CM

## 2018-08-21 ENCOUNTER — Encounter: Admit: 2018-08-21 | Discharge: 2018-08-21 | Payer: MEDICARE

## 2018-08-28 DIAGNOSIS — Z01818 Encounter for other preprocedural examination: Secondary | ICD-10-CM

## 2018-08-28 DIAGNOSIS — Z7682 Awaiting organ transplant status: Principal | ICD-10-CM

## 2018-08-28 DIAGNOSIS — N186 End stage renal disease: Secondary | ICD-10-CM

## 2018-08-29 ENCOUNTER — Encounter: Admit: 2018-08-29 | Discharge: 2018-08-30 | Payer: MEDICARE

## 2018-08-29 DIAGNOSIS — R944 Abnormal results of kidney function studies: Principal | ICD-10-CM

## 2018-08-29 DIAGNOSIS — Z01818 Encounter for other preprocedural examination: Secondary | ICD-10-CM

## 2018-08-29 DIAGNOSIS — E1122 Type 2 diabetes mellitus with diabetic chronic kidney disease: Secondary | ICD-10-CM

## 2018-08-29 DIAGNOSIS — N186 End stage renal disease: Secondary | ICD-10-CM

## 2018-08-29 DIAGNOSIS — Z992 Dependence on renal dialysis: Secondary | ICD-10-CM

## 2018-08-29 DIAGNOSIS — Z01810 Encounter for preprocedural cardiovascular examination: Secondary | ICD-10-CM

## 2018-08-29 DIAGNOSIS — Z7682 Awaiting organ transplant status: Secondary | ICD-10-CM

## 2018-09-06 ENCOUNTER — Encounter: Admit: 2018-09-06 | Discharge: 2018-09-07 | Payer: MEDICARE

## 2018-09-24 ENCOUNTER — Ambulatory Visit: Admit: 2018-09-24 | Discharge: 2018-10-07 | Payer: MEDICARE

## 2018-09-24 ENCOUNTER — Encounter: Admit: 2018-09-24 | Discharge: 2018-10-07 | Payer: MEDICARE

## 2018-09-24 DIAGNOSIS — Z7682 Awaiting organ transplant status: Secondary | ICD-10-CM

## 2018-09-24 DIAGNOSIS — E1122 Type 2 diabetes mellitus with diabetic chronic kidney disease: Secondary | ICD-10-CM

## 2018-09-24 DIAGNOSIS — N186 End stage renal disease: Secondary | ICD-10-CM

## 2018-09-24 DIAGNOSIS — Z992 Dependence on renal dialysis: Secondary | ICD-10-CM

## 2018-09-24 DIAGNOSIS — Z01818 Encounter for other preprocedural examination: Principal | ICD-10-CM

## 2018-09-24 DIAGNOSIS — Z01810 Encounter for preprocedural cardiovascular examination: Secondary | ICD-10-CM

## 2018-09-25 ENCOUNTER — Encounter: Admit: 2018-09-25 | Discharge: 2018-09-25 | Payer: MEDICARE

## 2018-09-30 DIAGNOSIS — N186 End stage renal disease: Secondary | ICD-10-CM

## 2018-09-30 DIAGNOSIS — Z7682 Awaiting organ transplant status: Principal | ICD-10-CM

## 2018-09-30 DIAGNOSIS — Z01818 Encounter for other preprocedural examination: Secondary | ICD-10-CM

## 2018-10-05 ENCOUNTER — Encounter: Admit: 2018-10-05 | Discharge: 2018-10-06 | Payer: MEDICARE

## 2018-10-23 ENCOUNTER — Encounter: Admit: 2018-10-23 | Discharge: 2018-10-23 | Payer: MEDICARE

## 2018-11-05 ENCOUNTER — Encounter: Admit: 2018-11-05 | Discharge: 2018-11-06 | Payer: MEDICARE

## 2018-11-05 DIAGNOSIS — Z01818 Encounter for other preprocedural examination: Principal | ICD-10-CM

## 2018-11-05 DIAGNOSIS — N186 End stage renal disease: Principal | ICD-10-CM

## 2018-11-05 DIAGNOSIS — Z7682 Awaiting organ transplant status: Principal | ICD-10-CM

## 2018-11-08 MED ORDER — AMLODIPINE 5 MG TABLET
ORAL_TABLET | Freq: Every day | ORAL | 3 refills | 0 days | Status: CP
Start: 2018-11-08 — End: 2019-11-08

## 2018-11-20 ENCOUNTER — Encounter: Admit: 2018-11-20 | Discharge: 2018-11-20 | Payer: MEDICARE

## 2018-11-27 DIAGNOSIS — Z01818 Encounter for other preprocedural examination: Secondary | ICD-10-CM

## 2018-11-27 DIAGNOSIS — N186 End stage renal disease: Secondary | ICD-10-CM

## 2018-11-27 DIAGNOSIS — Z7682 Awaiting organ transplant status: Principal | ICD-10-CM

## 2018-12-05 ENCOUNTER — Ambulatory Visit: Admit: 2018-12-05 | Discharge: 2018-12-06 | Payer: MEDICARE

## 2018-12-27 ENCOUNTER — Encounter: Admit: 2018-12-27 | Discharge: 2018-12-27 | Payer: MEDICARE

## 2018-12-30 ENCOUNTER — Encounter
Admit: 2018-12-30 | Discharge: 2018-12-30 | Payer: MEDICARE | Attending: Student in an Organized Health Care Education/Training Program | Primary: Student in an Organized Health Care Education/Training Program

## 2018-12-30 DIAGNOSIS — N186 End stage renal disease: Principal | ICD-10-CM

## 2018-12-30 DIAGNOSIS — Z01818 Encounter for other preprocedural examination: Secondary | ICD-10-CM

## 2019-01-02 DIAGNOSIS — Z7682 Awaiting organ transplant status: Principal | ICD-10-CM

## 2019-01-02 DIAGNOSIS — Z01818 Encounter for other preprocedural examination: Secondary | ICD-10-CM

## 2019-01-02 DIAGNOSIS — N186 End stage renal disease: Secondary | ICD-10-CM

## 2019-01-05 ENCOUNTER — Encounter: Admit: 2019-01-05 | Discharge: 2019-01-06 | Payer: MEDICARE

## 2019-01-22 ENCOUNTER — Encounter: Admit: 2019-01-22 | Discharge: 2019-01-22 | Payer: MEDICARE

## 2019-01-29 DIAGNOSIS — Z7682 Awaiting organ transplant status: Principal | ICD-10-CM

## 2019-01-29 DIAGNOSIS — N186 End stage renal disease: Secondary | ICD-10-CM

## 2019-01-29 DIAGNOSIS — Z01818 Encounter for other preprocedural examination: Secondary | ICD-10-CM

## 2019-02-04 ENCOUNTER — Encounter: Admit: 2019-02-04 | Discharge: 2019-02-05 | Payer: MEDICARE

## 2019-03-04 DIAGNOSIS — N186 End stage renal disease: Secondary | ICD-10-CM

## 2019-03-04 DIAGNOSIS — Z01818 Encounter for other preprocedural examination: Secondary | ICD-10-CM

## 2019-03-04 DIAGNOSIS — Z7682 Awaiting organ transplant status: Principal | ICD-10-CM

## 2019-03-07 ENCOUNTER — Encounter: Admit: 2019-03-07 | Discharge: 2019-03-08 | Payer: MEDICARE

## 2019-03-14 ENCOUNTER — Encounter: Admit: 2019-03-14 | Discharge: 2019-03-14 | Payer: MEDICARE

## 2019-03-17 DIAGNOSIS — N186 End stage renal disease: Secondary | ICD-10-CM

## 2019-03-17 DIAGNOSIS — Z7682 Awaiting organ transplant status: Principal | ICD-10-CM

## 2019-03-17 DIAGNOSIS — Z01818 Encounter for other preprocedural examination: Secondary | ICD-10-CM

## 2019-04-07 ENCOUNTER — Ambulatory Visit: Admit: 2019-04-07 | Discharge: 2019-04-08 | Payer: MEDICARE

## 2019-05-01 DIAGNOSIS — Z7682 Awaiting organ transplant status: Secondary | ICD-10-CM

## 2019-05-02 ENCOUNTER — Encounter
Admit: 2019-05-02 | Discharge: 2019-05-02 | Payer: MEDICARE | Attending: Student in an Organized Health Care Education/Training Program | Primary: Student in an Organized Health Care Education/Training Program

## 2019-05-02 DIAGNOSIS — Z7682 Awaiting organ transplant status: Secondary | ICD-10-CM

## 2019-05-07 ENCOUNTER — Encounter: Admit: 2019-05-07 | Discharge: 2019-05-07 | Payer: MEDICARE

## 2019-05-07 ENCOUNTER — Ambulatory Visit: Admit: 2019-05-07 | Discharge: 2019-05-08 | Payer: MEDICARE

## 2019-05-07 DIAGNOSIS — Z7682 Awaiting organ transplant status: Secondary | ICD-10-CM

## 2019-05-07 DIAGNOSIS — Z01818 Encounter for other preprocedural examination: Secondary | ICD-10-CM

## 2019-05-07 DIAGNOSIS — N186 End stage renal disease: Secondary | ICD-10-CM

## 2019-05-15 IMAGING — CT CT ABD-PELV W/O CM
2 of 7 series · 14 of 46 positions shown, 18 images · non-contrast
Comparison: None.

CLINICAL DATA: Right-sided abdominal pain.

EXAM:
CT ABDOMEN AND PELVIS WITHOUT CONTRAST
TECHNIQUE: Multidetector CT imaging of the abdomen and pelvis was performed
following the standard protocol without IV contrast.

[Series 2: axial st · axial · 0.71mm/px · z∈[-732,-332]mm · 11 of 94 slices shown, 15 images]
[im 9/94  soft-tissue]
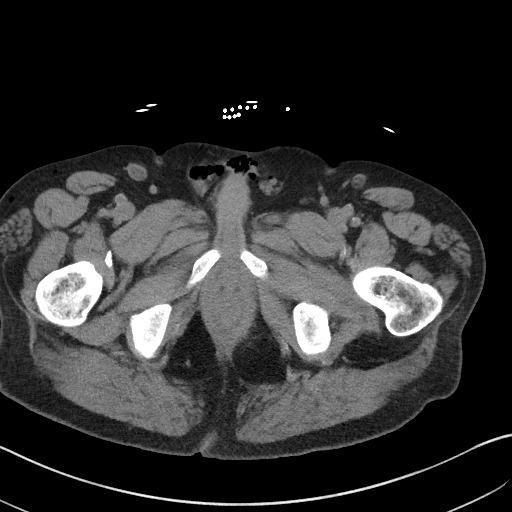
[im 9/94  bone]
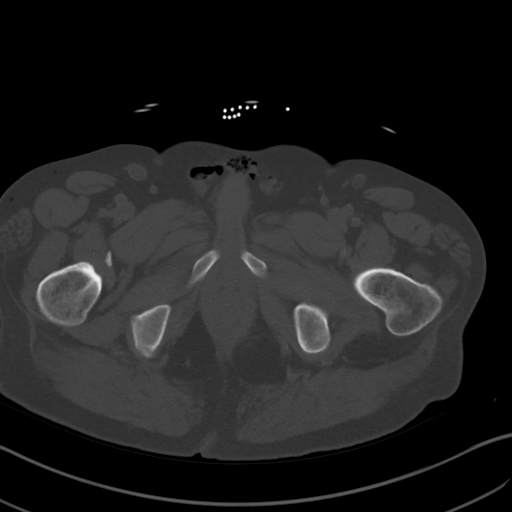
[im 17/94  soft-tissue]
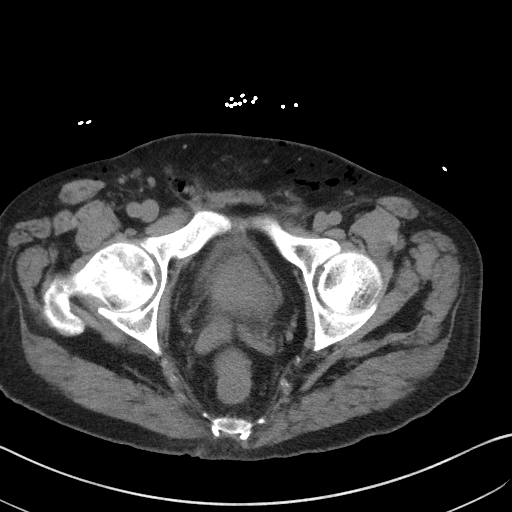
[im 26/94  soft-tissue]
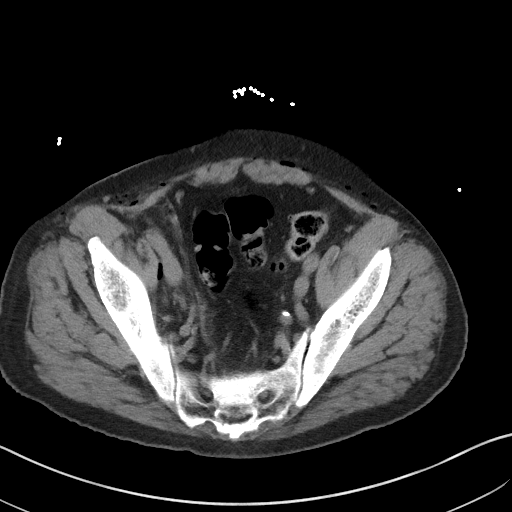
[im 39/94  soft-tissue]
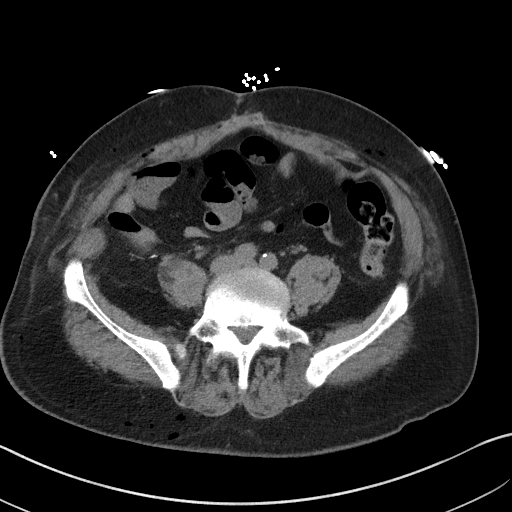
[im 47/94  soft-tissue]
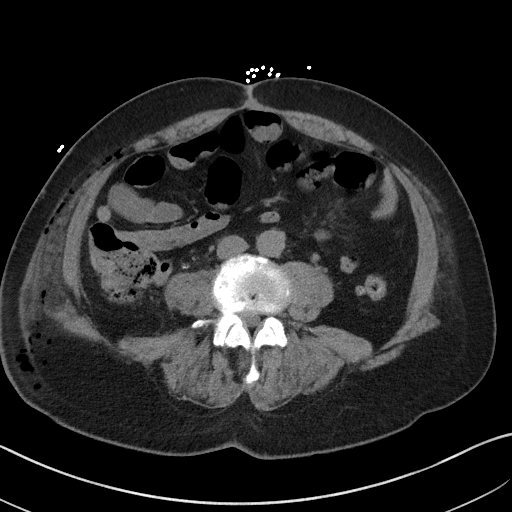
[im 55/94  soft-tissue]
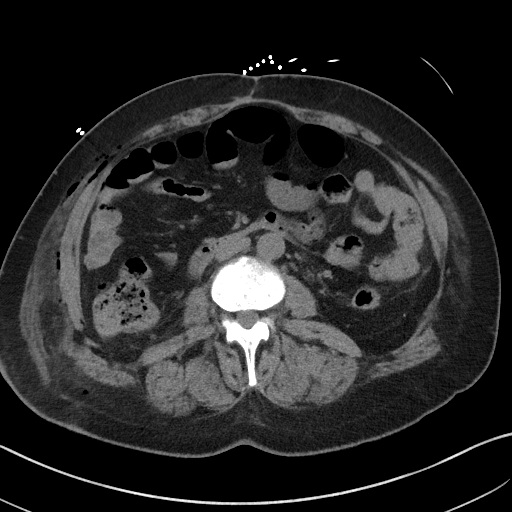
[im 68/94  soft-tissue]
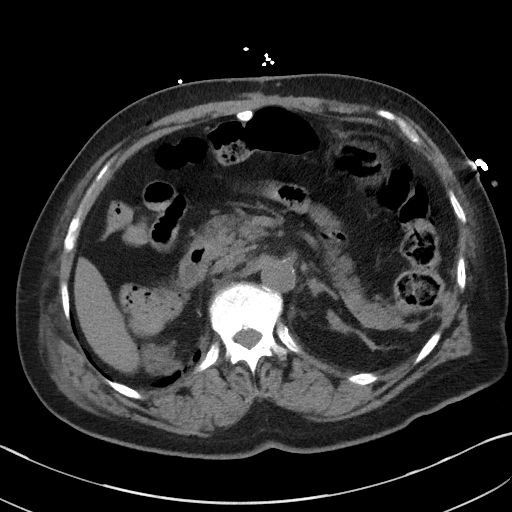
[im 77/94  soft-tissue]
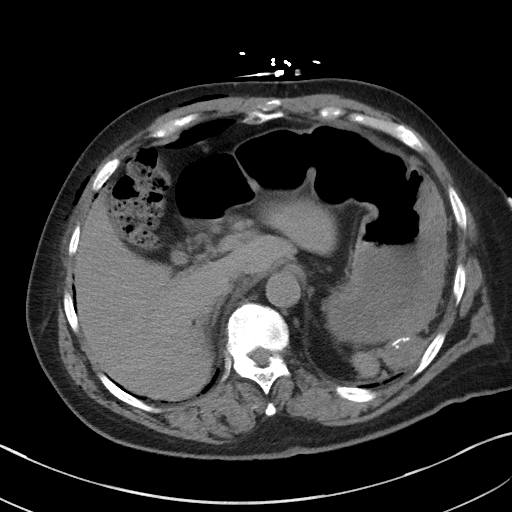
[im 77/94  lung]
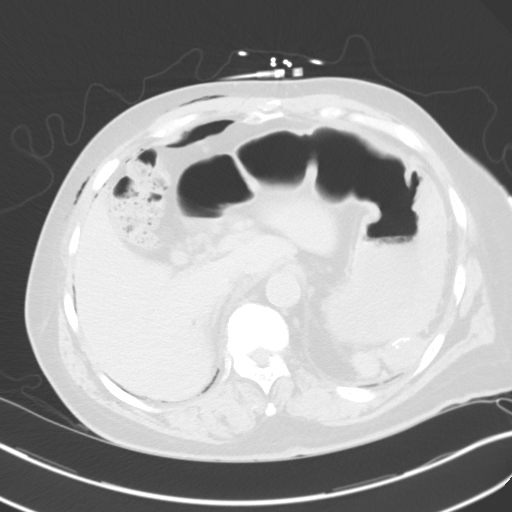
[im 81/94  lung]
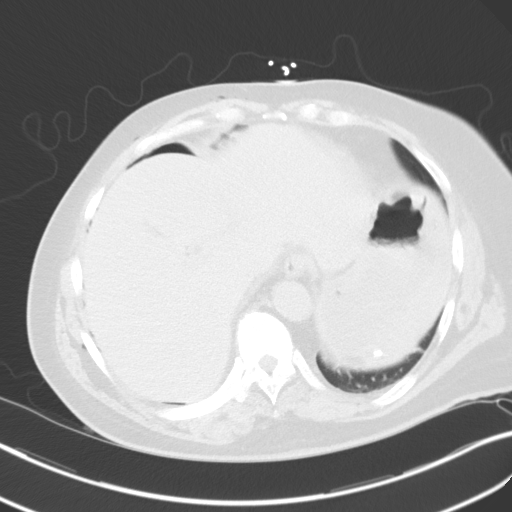
[im 85/94  soft-tissue]
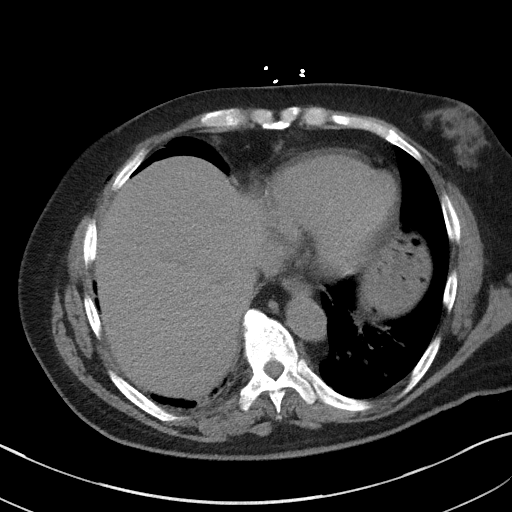
[im 85/94  lung]
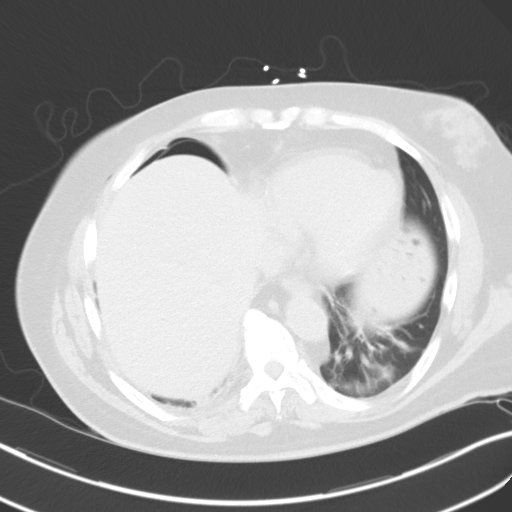
[im 85/94  bone]
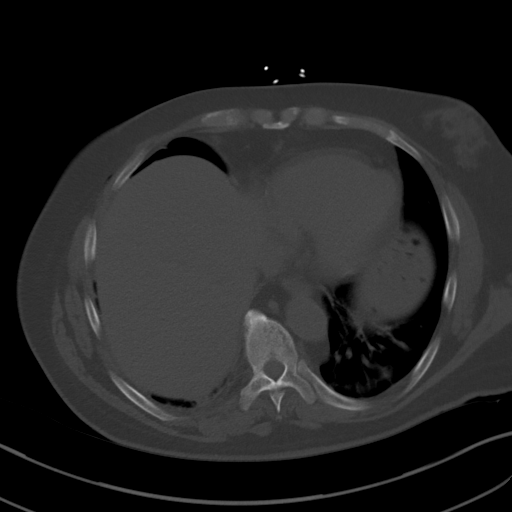
[im 89/94  lung]
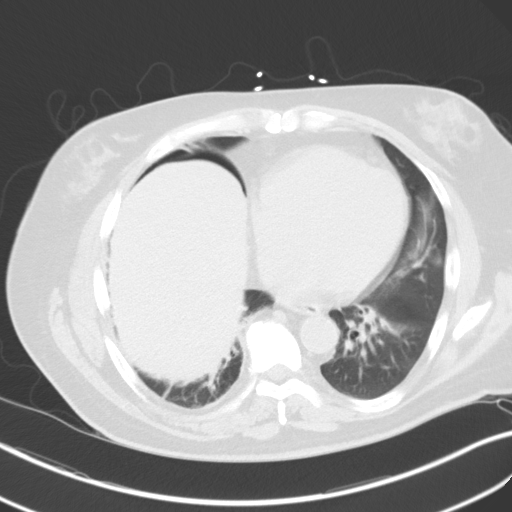

[Series 8: coronal st · coronal · 0.75mm/px · 3 of 93 slices shown]
[im 24/93  soft-tissue]
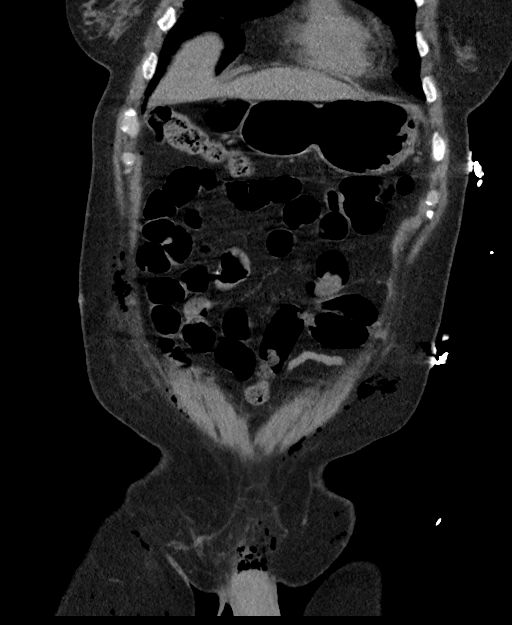
[im 47/93  soft-tissue]
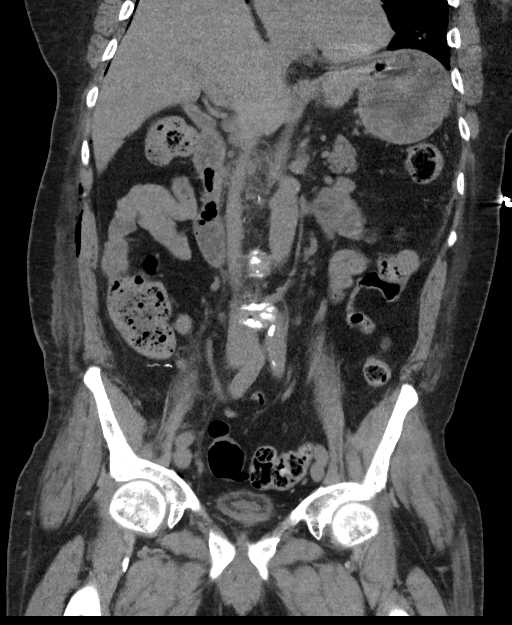
[im 70/93  soft-tissue]
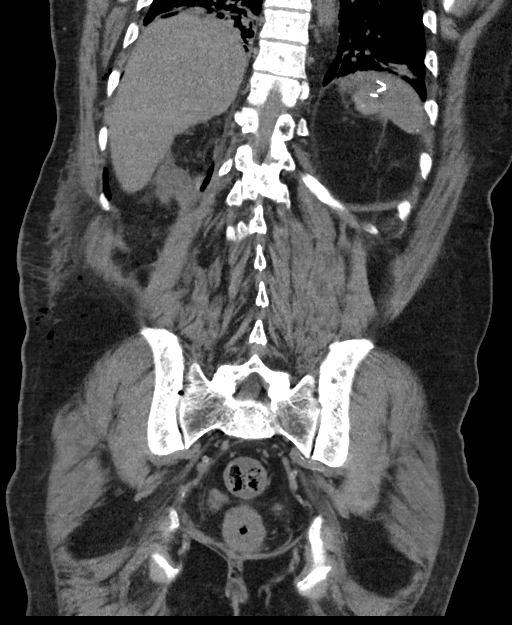

[14 of 46 positions shown; findings below may reference images not displayed]

FINDINGS: Lower chest: Bilateral posterior basilar subsegmental atelectasis is
noted.

Hepatobiliary: No focal liver abnormality is seen. No gallstones,
gallbladder wall thickening, or biliary dilatation.

Pancreas: Unremarkable. No pancreatic ductal dilatation or
surrounding inflammatory changes.

Spleen: Spleen appears to be small with surgical staples next to it.

Adrenals/Urinary Tract: Adrenal glands appear normal. Status post
bilateral nephrectomy. This reportedly occurred 2 days previously on
the right. Urinary bladder is decompressed. There is noted
pneumoperitoneum in the epigastric region and around the liver as
well as in the posterior portion of the right renal fossa. Soft
tissue gas is also noted in the subcutaneous tissues of the right
lateral abdominal wall, right flank and bilateral inguinal regions.

Stomach/Bowel: Stomach is within normal limits. Appendix appears
normal. No evidence of bowel wall thickening, distention, or
inflammatory changes.

Vascular/Lymphatic: 22 x 13 mm lymph node is noted in porta hepatis
region. Aortic atherosclerosis is noted without aneurysm formation.

Reproductive: Prostate is unremarkable.

Other: No abnormal fluid collection is noted. No definite hernia is
noted.

Musculoskeletal: 2.3 cm sclerotic density is noted in the L3
vertebral body. Metastatic disease cannot be excluded.
IMPRESSION: 2.3 cm sclerotic density seen in L3 vertebral body. MRI of the
lumbar spine or bone scan is recommended evaluate for possible
metastatic disease.

13 mm lymph node is noted in porta hepatis region which may be
inflammatory in etiology.

Aortic atherosclerosis.

Status post bilateral nephrectomy, with the right-sided procedure
being performed only 2 days ago. There is noted pneumoperitoneum in
the epigastric region as well as around the liver anteriorly, as
well as gas in right renal fossa and subcutaneous tissues of the
right lateral abdominal wall right posterior flank and bilateral
inguinal regions which most likely is postoperative in etiology.

## 2019-05-28 ENCOUNTER — Encounter: Admit: 2019-05-28 | Discharge: 2019-05-28 | Payer: MEDICARE

## 2019-05-28 DIAGNOSIS — Z7682 Awaiting organ transplant status: Principal | ICD-10-CM

## 2019-05-28 DIAGNOSIS — Z01818 Encounter for other preprocedural examination: Principal | ICD-10-CM

## 2019-05-28 DIAGNOSIS — N186 End stage renal disease: Principal | ICD-10-CM

## 2019-06-07 ENCOUNTER — Encounter: Admit: 2019-06-07 | Discharge: 2019-06-08 | Payer: MEDICARE

## 2019-06-21 ENCOUNTER — Ambulatory Visit
Admit: 2019-06-21 | Discharge: 2019-06-26 | Disposition: A | Discharge status: Home / Self Care | Payer: MEDICARE | Admitting: Surgery

## 2019-06-21 ENCOUNTER — Encounter
Admit: 2019-06-21 | Discharge: 2019-06-26 | Disposition: A | Discharge status: Home / Self Care | Payer: MEDICARE | Attending: Student in an Organized Health Care Education/Training Program | Admitting: Surgery

## 2019-06-21 ENCOUNTER — Encounter: Admit: 2019-06-21 | Discharge: 2019-06-21 | Payer: MEDICARE | Attending: Surgery | Primary: Surgery

## 2019-06-21 NOTE — Unmapped (Signed)
Transplant Surgery Admission    Chief Complaint: Possible renal transplantation    Assessment/Plan: Frank Leach is a 68 y.o. male with history of HTN, DM, RCC s/p bilateral nephrectomy who is on iHD MWF via L forearm AVF who presents for possible renal transplantation.with Dr. Norma Fredrickson.   - Admit to [facility] for [facility]  - NPO at 9pm, insert PIV, KVO  - Will send routine pre-operative transplant labs and obtain EKG and CXR  - Planned OR time: 5 am 11/15  - Consulted nephrology: no HD required tonight based on volume status  - Planned induction therapy: Campath/methylpred. Orders placed.  - Informed consent obtained.    History of Present Illness:    Frank Leach is a 68 y.o. male with history of ESRD secondary to HTN who presents today for evaluation for possible renal transplant. They were initially diagnosed with CKD in 2013 and has been on Hemodialysis since 01/2012. He is now s/p bilateral nephrectomy for RCC left 04/2016 and right robotic (RLQ transverse incisions) 12/2016. 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 The patient last received iHD on 06/20/2019 (MWF at [facility]) through a left AV- forearm fistula. He is anuric.  He has past medical history of DM II, prior H pylori with GERD, HLD and gout. Patient endorses no recent illnesses or hospitalizations in the last few months.    Stress test done in 09/24/18 and echo in 08/29/2018.    On review of systems, patient denies fevers, chills, lightheadedness, dizziness, vision/hearing changes, sore through, cough, SOB, CP, n/v/diarrhea/constipation, blood in his stool, or melena.     Past Medical History:   Past Medical History:   Diagnosis Date   ??? Anemia    ??? Cancer (CMS-HCC)     clear cell adenocarcinoma of left kidney   ??? Diabetes mellitus (CMS-HCC)    ??? Diabetic nephropathy (CMS-HCC) ??? ESRD (end stage renal disease) on dialysis (CMS-HCC)    ??? ESRD on dialysis (CMS-HCC)    ??? Gout    ??? Hyperlipidemia    ??? Hypertension        Past Surgical History:  Past Surgical History:   Procedure Laterality Date   ??? AV FISTULA PLACEMENT     ??? PR COLONOSCOPY FLX DX W/COLLJ SPEC WHEN PFRMD N/A 04/30/2018    Procedure: COLONOSCOPY, FLEXIBLE, PROXIMAL TO SPLENIC FLEXURE; DIAGNOSTIC, W/WO COLLECTION SPECIMEN BY BRUSH OR WASH;  Surgeon: Luanne Bras, MD;  Location: [facility];  Service: Gastroenterology   ??? PR COLONOSCOPY W/BIOPSY SINGLE/MULTIPLE N/A 04/30/2018    Procedure: COLONOSCOPY, FLEXIBLE, PROXIMAL TO SPLENIC FLEXURE; WITH BIOPSY, SINGLE OR MULTIPLE;  Surgeon: Luanne Bras, MD;  Location: [facility] PROC[facility];  Service: Gastroenterology   ??? PR COLSC FLX W/RMVL OF TUMOR POLYP LESION SNARE TQ N/A 04/30/2018    Procedure: COLONOSCOPY FLEX; W/REMOV TUMOR/LES BY SNARE;  Surgeon: Luanne Bras, MD;  Location: [facility] PROCEDURES [facility];  Service: Gastroenterology   ??? PR EXPLORATORY OF ABDOMEN Midline 04/14/2016    Procedure: EXPLORATORY LAPAROTOMY, EXPLORATORY CELIOTOMY WITH OR WITHOUT BIOPSY(S);  Surgeon: Leona Carry, MD;  Location: MAIN OR [facility];  Service: Transplant   ??? PR EXPLORATORY OF ABDOMEN N/A 04/16/2016    Procedure: EXPLORATORY LAPAROTOMY, EXPLORATORY CELIOTOMY WITH OR WITHOUT BIOPSY(S);  Surgeon: Leona Carry, MD;  Location: MAIN OR [facility];  Service: Transplant   ??? PR LAP, RADICAL NEPHRECTOMY Right 12/27/2016  Procedure: Robotic Xi Laparoscopy; Radical Nephrectomy (Incl Remove Gerota`S Fascia, Fatty Tissue, Reg Lymph Node, Adrenalectomy);  Surgeon: Tomie China, MD;  Location: MAIN OR [facility];  Service: Urology   ??? PR NEGATIVE PRESSURE WOUND THERAPY DME >50 SQ CM N/A 04/12/2016 Procedure: NEG PRESS WOUND TX (VAC ASSIST) INCL TOPICALS, PER SESSION, TSA GREATER THAN/= 50 CM SQUARED;  Surgeon: Leona Carry, MD;  Location: MAIN OR [facility];  Service: Transplant   ??? PR NEGATIVE PRESSURE WOUND THERAPY DME >50 SQ CM Bilateral 04/14/2016    Procedure: NEG PRESS WOUND TX (VAC ASSIST) INCL TOPICALS, PER SESSION, TSA GREATER THAN/= 50 CM SQUARED;  Surgeon: Leona Carry, MD;  Location: MAIN OR [facility];  Service: Transplant   ??? PR NEGATIVE PRESSURE WOUND THERAPY DME >50 SQ CM N/A 04/16/2016    Procedure: NEG PRESS WOUND TX (VAC ASSIST) INCL TOPICALS, PER SESSION, TSA GREATER THAN/= 50 CM SQUARED;  Surgeon: Leona Carry, MD;  Location: MAIN OR [facility];  Service: Transplant   ??? PR NEPHRECTOMY, W/PART. URETECTOMY Bilateral 04/11/2016    Procedure: LAPAROSCOPY, SURGICAL, NEPHRECTOMY WITH TOTAL URETERECTOMY;  Surgeon: Leona Carry, MD;  Location: MAIN OR [facility];  Service: Transplant   ??? PR REMV KIDNEY,W/RIB RESECTION Bilateral 04/11/2016    Procedure: NEPHRECTOMY, INCLUDING PARTIAL URETERECTOMY, ANY OPEN APPROACH INCLUDING RIB RESECTION;  Surgeon: Leona Carry, MD;  Location: MAIN OR [facility];  Service: Transplant   ??? SPLENECTOMY         Medications:   No current facility-administered medications on file prior to encounter.      Current Outpatient Medications on File Prior to Encounter   Medication Sig Dispense Refill   ??? allopurinol (ZYLOPRIM) 100 MG tablet Take 100 mg by mouth every evening.      ??? amLODIPine (NORVASC) 5 MG tablet Take 1 tablet (5 mg total) by mouth daily. 90 tablet 3   ??? aspirin 81 MG chewable tablet Chew 1 tablet (81 mg total) daily.     ??? B complex-vitamin C-folic acid (RENA-VITE) 0.8 mg Tab Take 1 tablet by mouth daily.     ??? calcitriol (ROCALTROL) 0.5 MCG capsule Take 1 mcg by mouth Every Monday, Wednesday, and Friday. Administered at dialysis MWF     ??? cinacalcet (SENSIPAR) 60 MG tablet Take 60 mg by mouth nightly. ??? ERGOCALCIFEROL, VITAMIN D2, (VITAMIN D2 ORAL) Take 50,000 Units by mouth every thirty (30) days.      ??? ferric citrate (AURYXIA) 210 mg iron Tab Take 2 tablets (420 mg total) by mouth Three (3) times a day. 180 tablet 11   ??? HEPARIN SODIUM,PORCINE (HEPARIN, PORCINE,) 1,000 unit/mL 1000 unit/mL injection Infuse 7,500 Units into a venous catheter. Bolus immediately before dialysis     ??? midodrine (PROAMATINE) 10 MG tablet Take 2 on dialysis days and 1 on non-dialysis days 90 tablet 11   ??? omeprazole (PRILOSEC) 40 MG capsule Take 40 mg by mouth nightly.      ??? pravastatin (PRAVACHOL) 40 MG tablet Take 40 mg by mouth nightly.      ??? sevelamer (RENVELA) 800 mg tablet Take 4 capsules with food 3 times a day and 1 tablet twice a day with snacks 360 tablet 12     Allergies:   No Known Allergies    Family History:   Family History   Problem Relation Age of Onset   ??? Kidney disease Mother    ??? Cancer Father    ??? Kidney disease Brother      Social  History:   Social History     Socioeconomic History   ??? Marital status: Married     Spouse name: Celene Skeen   ??? Number of children: 1   ??? Years of education: Not on file   ??? Highest education level: High school graduate   Occupational History   ??? Not on file   Social Needs   ??? Financial resource strain: Not on file   ??? Food insecurity     Worry: Not on file     Inability: Not on file   ??? Transportation needs     Medical: Not on file     Non-medical: Not on file   Tobacco Use   ??? Smoking status: Never Smoker   ??? Smokeless tobacco: Never Used   Substance and Sexual Activity   ??? Alcohol use: No   ??? Drug use: No   ??? Sexual activity: Not on file   Lifestyle   ??? Physical activity     Days per week: Not on file     Minutes per session: Not on file   ??? Stress: Not on file   Relationships   ??? Social Wellsite geologist on phone: Not on file     Gets together: Not on file     Attends religious service: Not on file     Active member of club or organization: Not on file Attends meetings of clubs or organizations: Not on file     Relationship status: Not on file   Other Topics Concern   ??? Not on file   Social History Narrative   ??? Not on file       OBJECTIVE  Vital Signs:  BP 132/61  - Pulse 69  - Temp 36.1 ??C (Oral)  - Resp 18  - SpO2 98%     Physical Exam:  General: No acute distress, pleasant, cooperative  HEENT: trachea midline  Cardiovascular:  Regular rate and rhythm no murmur noted.  Pulmonary: Normal work of breathing on room air, clear to ausculation   Abdominal: soft, non tender, non distended.    Extremities: left AV forearm fistula, thrill noted   Groin pulses: palpable femoral and DP pulses bilaterally,  Palpable R radial pulse  Neurological: No focal deficits, AAOx3  Psych:  organized thought, normal affect, interactive    Pertinent Laboratory studies:  New labs pending    Pertinent Imaging:  ECHO performed on 08/20/2018 w/ EF >55%, Stress test on 09/24/2018 probably normal without evidence for significant ischemia or scar    Pending CXR and EKG

## 2019-06-22 DIAGNOSIS — Z7682 Awaiting organ transplant status: Principal | ICD-10-CM

## 2019-06-22 NOTE — Unmapped (Signed)
TRANSPLANT SURGERY PROGRESS NOTE    Admit Date: 06/21/2019, Hospital Day: 2  Hospital Service: Surg Transplant [facility])      Assessment     Frank Leach is a 68 y.o. male with history of HTN, DM, RCC s/p bilateral nephrectomy who is on iHD MWF via L forearm AVF who presents for renal transplant.      Plan     - Proceed to OR today for renal transplant.     Please page SRF [phone] with questions or concerns.      Subjective     No acute events overnight.    Objective     Vitals:   Temp:  [36.1 ??C-36.9 ??C] 36.5 ??C  Heart Rate:  [64-80] 64  Resp:  [16-18] 16  BP: (116-132)/(61-78) 127/78  MAP (mmHg):  [84-88] 84  SpO2:  [95 %-98 %] 95 %    Intake/Output last 24 hours:  No intake/output data recorded.    Physical Exam:    General:  Alert and oriented, in no apparent distress.   Pulmonary: Normal work of breathing. No accessory muscle use. Symmetrical chest rise and fall.   CV: No lower extremity edema bilaterally. Bilateral palpable femoral and DP pulses.   Abdomen: Soft, non-tender, non-distended. No rebound or guarding.   Extremities: Warm, well perfused, normal skin turgor.    Data Review: Labs and imaging personally reviewed.     Evelena Leyden, MD

## 2019-06-23 DIAGNOSIS — Z940 Kidney transplant status: Principal | ICD-10-CM

## 2019-06-23 DIAGNOSIS — Z79899 Other long term (current) drug therapy: Principal | ICD-10-CM

## 2019-06-23 MED ORDER — ENVARSUS XR 1 MG TABLET,EXTENDED RELEASE
ORAL_TABLET | Freq: Every day | ORAL | 11 refills | 30.00000 days | Status: CP
Start: 2019-06-23 — End: 2020-06-22

## 2019-06-23 MED ORDER — VALGANCICLOVIR 450 MG TABLET
ORAL_TABLET | Freq: Every day | ORAL | 2 refills | 30 days | Status: CP
Start: 2019-06-23 — End: 2019-09-21
  Filled 2019-06-26: qty 30, 30d supply, fill #0

## 2019-06-23 MED ORDER — ENVARSUS XR 4 MG TABLET,EXTENDED RELEASE
ORAL_TABLET | Freq: Every day | ORAL | 11 refills | 30.00000 days | Status: CP
Start: 2019-06-23 — End: 2020-06-22

## 2019-06-23 MED ORDER — MYCOPHENOLATE SODIUM 180 MG TABLET,DELAYED RELEASE
ORAL_TABLET | Freq: Two times a day (BID) | ORAL | 11 refills | 30 days | Status: CP
Start: 2019-06-23 — End: 2020-06-22
  Filled 2019-06-26: qty 180, 30d supply, fill #0

## 2019-06-24 NOTE — Unmapped (Signed)
TRANSPLANT SURGERY PROGRESS NOTE    Admit Date: 06/21/2019, Hospital Day: 3  Hospital Service: Surg Transplant [facility])      Assessment     Frank Leach is a 68 y.o. male with history of HTN, DM, RCC s/p bilateral nephrectomy who is on iHD MWF via L forearm AVF who presents for renal transplant.      Plan     - Pain control with tylenol, tramadol, dilaudid, gabapentin  - Amlodipine 5  - CLD  - DC 1:1 replacement. NS @ 50cc/hr  - Replete Mg  - Bowel reg  - Lasix 80 IV x1  - Noon potassium; plan for dialysis if K continues to be >5  - Endocrine consult, ISS lispro  - Induced with campath, solumedrol taper  - Start tac, cellcept  - holding bactrim due to hyperkalemia, valcyte    Please page SRF [phone] with questions or concerns.      Subjective     No acute events overnight. Afebrile, VSS. Feels well this morning. Pain tolerable. Denies flatus or BM.     Objective     Vitals:   Temp:  [35.8 ??C-36.9 ??C] 35.8 ??C  Heart Rate:  [71-83] 74  SpO2 Pulse:  [71-83] 78  Resp:  [15-23] 20  BP: (108-165)/(50-77) 133/63  MAP (mmHg):  [69-84] 69  A BP-2: (111-166)/(42-58) 136/57  MAP:  [64 mmHg-93 mmHg] 84 mmHg  SpO2:  [88 %-100 %] 94 %    Intake/Output last 24 hours:  I/O last 3 completed shifts:  In: 7041.3 [P.O.:370; I.V.:5617.5; IV Piggyback:1053.8]  Out: 1615 [Urine:975; Drains:340; Blood:300]    Physical Exam:    General:  Alert and oriented, in no apparent distress.   Pulmonary: Normal work of breathing. No accessory muscle use. Symmetrical chest rise and fall.   CV: No lower extremity edema bilaterally. Bilateral palpable femoral and DP pulses.   Abdomen: Soft, non-tender, non-distended. No rebound or guarding.   Extremities: Warm, well perfused, normal skin turgor.    Data Review: Labs and imaging personally reviewed.

## 2019-06-25 NOTE — Unmapped (Signed)
TRANSPLANT SURGERY PROGRESS NOTE    Admit Date: 06/21/2019, Hospital Day: 4  Hospital Service: Surg Transplant [facility])      Assessment     Frank Leach is a 67 y.o. male with history of HTN, DM, RCC s/p bilateral nephrectomy who is on iHD MWF via L forearm AVF who presents for renal transplant.      Plan     - Pain control with tylenol, tramadol, dilaudid, gabapentin  - Amlodipine 5  - Regular diet  -  NS @ 50cc/hr  - Replete Mg  - Bowel reg  - Lasix 80 IV x1 11/17  - Potassium; plan for dialysis if K continues to be >5  - Endocrine consult, ISS lispro  - Induced with campath, solumedrol taper  - Start tac, cellcept  - holding bactrim due to hyperkalemia, valcyte  -Low UOP 11/17, renal dopplers pending    Please page SRF [phone] with questions or concerns.      Subjective     No acute events overnight. Afebrile, VSS. Feels well this morning. Pain tolerable. Endorses flatus or BM.     Objective     Vitals:   Temp:  [36.4 ??C-36.9 ??C] 36.7 ??C  Heart Rate:  [66-86] 86  SpO2 Pulse:  [70-79] 70  Resp:  [18-25] 18  BP: (123-154)/(55-72) 150/64  MAP (mmHg):  [78-101] 95  SpO2:  [96 %-98 %] 96 %    Intake/Output last 24 hours:  I/O last 3 completed shifts:  In: 4248.5 [P.O.:2350; I.V.:1787.5; IV Piggyback:111]  Out: 1305 [Urine:990; Drains:315]    Physical Exam:    General:  Alert and oriented, in no apparent distress.   Pulmonary: Normal work of breathing. No accessory muscle use. Symmetrical chest rise and fall.   CV: No lower extremity edema bilaterally. Bilateral palpable femoral and DP pulses.   Abdomen: Soft, non-tender, non-distended. No rebound or guarding.   Extremities: Warm, well perfused, normal skin turgor.    Data Review: Labs and imaging personally reviewed.

## 2019-06-25 NOTE — Unmapped (Addendum)
Frank Leach is a 68 year old male with past history of HTN, DM, RCC s/p bilateral nephrectomy who is on iHD MWF via L forearm AVF. The patient was taken to the OR on 2023-07-11 for deceased donor kidney transplantation. He tolerated the procedure well, was extubated in the OR, and was taken to the PACU where He received routine postoperative care. He was then transferred to the Surgical Stepdown unit for close observation and cardiorespiratory monitoring. The allograft was evaluated with ultrasonography and found to have good perfusion, high-normal resistive indices, and patent vasculature.   His potassium remained elevated following the operation, he was shifted, and given lasix IV, but K remained elevated and he was dyalized on POD1.   He was initially placed on 1:1 UOP/IVF replacement and then transitioned to 0.9% NS. He was clinically stable postoperatively, maintaining adequate urine output, and was transferred to the floor on POD#1 . He did well thereafter and  diet was slowly advanced and at the time of discharge He was tolerating a regular diet. The patient was able to void spontaneously following discontinuation of Foley catheter POD#2, have his pain controlled with PO pain medication, and return to the preoperative ambulatory status. Anti-rejection medication levels were monitored and dosages adjusted to maintain a therapeutic regimen. The patient was seen and assessed by Physical Therapy and deemed suitable for discharge. He will be discharged home on POD#4 in stable condition without home healthcare.

## 2019-06-26 LAB — BASIC METABOLIC PANEL
ANION GAP: 13 mmol/L (ref 7–15)
BLOOD UREA NITROGEN: 82 mg/dL — ABNORMAL HIGH (ref 7–21)
CALCIUM: 8.7 mg/dL (ref 8.5–10.2)
CHLORIDE: 101 mmol/L (ref 98–107)
CO2: 23 mmol/L (ref 22.0–30.0)
CREATININE: 7.49 mg/dL — ABNORMAL HIGH (ref 0.70–1.30)
EGFR CKD-EPI AA MALE: 8 mL/min/{1.73_m2} — ABNORMAL LOW (ref >=60)
EGFR CKD-EPI NON-AA MALE: 7 mL/min/{1.73_m2} — ABNORMAL LOW (ref >=60)
GLUCOSE RANDOM: 120 mg/dL (ref 70–179)
POTASSIUM: 4.4 mmol/L (ref 3.5–5.0)
SODIUM: 137 mmol/L (ref 135–145)

## 2019-06-26 LAB — PROTEIN / CREATININE RATIO, URINE: PROTEIN URINE: 71.4 mg/dL

## 2019-06-26 LAB — PROTEIN UA: Protein:MCnc:Pt:Urine:Qn:Test strip: 100 — AB

## 2019-06-26 LAB — URINALYSIS
BILIRUBIN UA: NEGATIVE
GLUCOSE UA: NEGATIVE
KETONES UA: NEGATIVE
NITRITE UA: NEGATIVE
PH UA: 5 (ref 5.0–9.0)
PROTEIN UA: 100 — AB
RBC UA: >182 /HPF — ABNORMAL HIGH (ref <=3)
SPECIFIC GRAVITY UA: 1.013 (ref 1.003–1.030)
SQUAMOUS EPITHELIAL: 1 /HPF (ref 0–5)
UROBILINOGEN UA: 0.2
WBC UA: 77 /HPF — ABNORMAL HIGH (ref <=2)

## 2019-06-26 LAB — HCV LIVER FIBROSIS PANEL
A2MACROG (SENDOUT): 193 mg/dL
ACTITEST SCORE: 0.11
ALT (SENDOUT): 26 U/L
APO-A1 (SENDOUT): 112 mg/dL — ABNORMAL LOW
BILI, TOTAL (SENDOUT): 0.4 mg/dL
FIBROTEST SCORE: 0.26
GGT (SENDOUT): 16 U/L
HAPTOGLOBIN-LABCORP: 174 mg/dL

## 2019-06-26 LAB — MAGNESIUM: Magnesium:MCnc:Pt:Ser/Plas:Qn:: 1.8

## 2019-06-26 LAB — PROTEIN URINE: Protein:MCnc:Pt:Urine:Qn:: 71.4

## 2019-06-26 LAB — PHOSPHORUS: Phosphate:MCnc:Pt:Ser/Plas:Qn:: 7.2 — ABNORMAL HIGH

## 2019-06-26 LAB — CBC
HEMATOCRIT: 28 % — ABNORMAL LOW (ref 41.0–53.0)
MEAN CORPUSCULAR HEMOGLOBIN CONC: 32.5 g/dL (ref 31.0–37.0)
MEAN CORPUSCULAR HEMOGLOBIN: 33.3 pg (ref 26.0–34.0)
MEAN CORPUSCULAR VOLUME: 102.4 fL — ABNORMAL HIGH (ref 80.0–100.0)
MEAN PLATELET VOLUME: 9.7 fL (ref 7.0–10.0)
PLATELET COUNT: 212 10*9/L (ref 150–440)
RED BLOOD CELL COUNT: 2.73 10*12/L — ABNORMAL LOW (ref 4.50–5.90)
RED CELL DISTRIBUTION WIDTH: 15.2 % — ABNORMAL HIGH (ref 12.0–15.0)

## 2019-06-26 LAB — TACROLIMUS, TROUGH: Lab: 2.1 — ABNORMAL LOW

## 2019-06-26 LAB — PLATELET COUNT: Platelets:NCnc:Pt:Bld:Qn:Automated count: 212

## 2019-06-26 LAB — ACTITEST GRADE

## 2019-06-26 LAB — BUN / CREAT RATIO: Urea nitrogen/Creatinine:MRto:Pt:Ser/Plas:Qn:: 11

## 2019-06-26 MED ORDER — AMLODIPINE 10 MG TABLET
ORAL_TABLET | Freq: Every day | ORAL | 11 refills | 30 days | Status: CP
Start: 2019-06-26 — End: 2020-06-25
  Filled 2019-06-26: qty 30, 30d supply, fill #0

## 2019-06-26 MED ORDER — ASPIRIN 81 MG TABLET,DELAYED RELEASE
ORAL_TABLET | Freq: Every day | ORAL | 11 refills | 30.00000 days | Status: CP
Start: 2019-06-26 — End: 2020-06-25

## 2019-06-26 MED ORDER — DOCUSATE SODIUM 100 MG CAPSULE
Freq: Two times a day (BID) | ORAL | 0 refills | 30 days | Status: CP | PRN
Start: 2019-06-26 — End: ?
  Filled 2019-06-26: qty 60, 30d supply, fill #0

## 2019-06-26 MED ORDER — MG-PLUS-PROTEIN 133 MG TABLET
ORAL_TABLET | Freq: Two times a day (BID) | ORAL | 11 refills | 0 days | Status: CP
Start: 2019-06-26 — End: ?
  Filled 2019-06-26: qty 100, 50d supply, fill #0

## 2019-06-26 MED ORDER — TRAMADOL 50 MG TABLET
Freq: Two times a day (BID) | ORAL | 0 refills | 8.00000 days | Status: CP | PRN
Start: 2019-06-26 — End: ?
  Filled 2019-06-26: qty 28, 7d supply, fill #0

## 2019-06-26 MED ORDER — ACETAMINOPHEN 500 MG TABLET
ORAL_TABLET | Freq: Four times a day (QID) | ORAL | 0 refills | 13 days | Status: CP | PRN
Start: 2019-06-26 — End: ?
  Filled 2019-06-26: qty 30, 30d supply, fill #0
  Filled 2019-06-26: qty 100, 13d supply, fill #0

## 2019-06-26 MED ORDER — GABAPENTIN 100 MG CAPSULE
ORAL_CAPSULE | Freq: Every evening | ORAL | 0 refills | 12.00000 days | Status: CP
Start: 2019-06-26 — End: 2019-07-08
  Filled 2019-06-26: qty 12, 12d supply, fill #0

## 2019-06-26 MED ORDER — POLYETHYLENE GLYCOL 3350 17 GRAM/DOSE ORAL POWDER
Freq: Every day | ORAL | 0 refills | 30 days | Status: CP | PRN
Start: 2019-06-26 — End: 2019-07-26
  Filled 2019-06-26: qty 510, 30d supply, fill #0

## 2019-06-26 MED FILL — MG-PLUS-PROTEIN 133 MG TABLET: 50 days supply | Qty: 100 | Fill #0 | Status: AC

## 2019-06-26 MED FILL — ASPIRIN 81 MG TABLET,DELAYED RELEASE: 30 days supply | Qty: 30 | Fill #0 | Status: AC

## 2019-06-26 MED FILL — TRAMADOL 50 MG TABLET: 7 days supply | Qty: 28 | Fill #0 | Status: AC

## 2019-06-26 MED FILL — GABAPENTIN 100 MG CAPSULE: 12 days supply | Qty: 12 | Fill #0 | Status: AC

## 2019-06-26 MED FILL — POLYETHYLENE GLYCOL 3350 17 GRAM/DOSE ORAL POWDER: 30 days supply | Qty: 510 | Fill #0 | Status: AC

## 2019-06-26 MED FILL — VALGANCICLOVIR 450 MG TABLET: 30 days supply | Qty: 30 | Fill #0 | Status: AC

## 2019-06-26 MED FILL — ENVARSUS XR 1 MG TABLET,EXTENDED RELEASE: 30 days supply | Qty: 90 | Fill #0 | Status: AC

## 2019-06-26 MED FILL — AMLODIPINE 10 MG TABLET: 30 days supply | Qty: 30 | Fill #0 | Status: AC

## 2019-06-26 MED FILL — ACETAMINOPHEN 500 MG TABLET: 13 days supply | Qty: 100 | Fill #0 | Status: AC

## 2019-06-26 MED FILL — SULFAMETHOXAZOLE 400 MG-TRIMETHOPRIM 80 MG TABLET: 28 days supply | Qty: 12 | Fill #0 | Status: AC

## 2019-06-26 MED FILL — ENVARSUS XR 4 MG TABLET,EXTENDED RELEASE: 30 days supply | Qty: 90 | Fill #0 | Status: AC

## 2019-06-26 MED FILL — MYCOPHENOLATE SODIUM 180 MG TABLET,DELAYED RELEASE: 30 days supply | Qty: 180 | Fill #0 | Status: AC

## 2019-06-26 MED FILL — DOK 100 MG CAPSULE: 30 days supply | Qty: 60 | Fill #0 | Status: AC

## 2019-06-26 MED FILL — ENVARSUS XR 1 MG TABLET,EXTENDED RELEASE: ORAL | 30 days supply | Qty: 90 | Fill #0

## 2019-06-26 MED FILL — ENVARSUS XR 4 MG TABLET,EXTENDED RELEASE: ORAL | 30 days supply | Qty: 90 | Fill #0

## 2019-06-26 NOTE — Unmapped (Signed)
SRF CASE REVIEW    An interdisciplinary care conference was held today and included the following team members: Estanislado Emms, MD Transplant Surgeon , Chirag Lanney Gins, MD Transplant Surgeon, Jacqulyn Ducking, MD Transplant Surgeon, Cranston Neighbor, RN, CCTN TNC, Salem Senate, RN, MSN, TNC, Nunzio Cobbs, MD Transplant Nephrology Fellow, Lisbeth Ply, MD Transplant Nephrology, Carole Binning, LCSW/ CM Transplant Social Worker, Geannie Risen, LCSW/ CM Transplant Social Worker, Valentino Nose, RD Transplant Dietitian, Trilby Leaver, MD Transplant Surgery Fellow, Odessa Fleming, PharmD, Newton Pigg, RN, DNP, Transplant Surgery Nurse Coordinator, Doyce Loose, MD Transplant Surgeon, Katherine Roan, MD Transplant Surgeon, Alden Server, MD General Surgery Resident.     Per team, patient did well overnight and team is anticipating patient will be discharged today.       Discharge Plan: Confirmed with team no dialysis needs at discharge. No additional discharge needs identified for patient at this time.       Evelena Asa, LCSW, CCTSW  Transplant Case Manager  [employer] for Transplant Care

## 2019-06-26 NOTE — Unmapped (Signed)
Diabetes Care Team Follow Up Consult Note       Requesting Attending Physician : Lilyan [facility]*  Service Requesting Consult : Surg Transplant [facility])  Primary Care Provider: Carron Brazen, MD    Assessment and Plan:  IMPRESSION:  Frank Leach is a 68 y.o. male admitted for renal transplant. We have been consulted at the request of [facility]* to evaluate [place] for hyperglycemia.     RECOMMENDATIONS:  1. Type 2 diabetes: Pt had mild prandial hyperglycemia at dinner time yesterday, BG otherwise well controlled. Received last dose of Methylprednisolone yesterday. Expect BG to continue to trend down today. Will switch to sensitive correction and we will sign off.    - Change to Lispro sensitive correction 1:50>150 qACHS    Other problems complicating glycemic control:  ESRD on dialysis, Renal transplant and steroid taper  Principal Problem:    Kidney replaced by transplant     We will sign off as BG doing well on above regimen.   DCT will be reconsulted if BG> 180 or <70.  Thank you for this consult.     Please page with questions or concerns: Daivd Council, [place]: [phone] from 6AM - 3PM on weekdays or endocrine fellow on call: [phone] from 3PM - 6AM on weekdays or on weekends and holidays. If APP cannot be reached, please page the endocrine fellow on call.    Subjective:  Initial encounter HPI:  Frank Leach is a 68 y.o. male with pertinent past medical history of HTN, DM, RCC s/p bilateral nephrectomy who is on iHD MWF via L forearm AVF who presents for possible renal transplantation. Now s/p renal transplant on 11/15. On IV steroid taper per protocol, with perioperative hyperglycemia. Patient currently recovering well post-op. No complaints.     Interval History: Pt reports feeling well this morning. No acute complaints. Steroid taper completed yesterday (11/18). BG stable.     Diabetes History:  Patient has a history of Type 2 diabetes diagnosed 10+ years ago. Diabetes is managed by: PCP.  Current home diabetes regimen: diet/lifestyle  Current home blood glucose monitoring: none.  Typical home blood glucose range:  unknown.  Hypoglycemia awareness: yes.  Complications related to diabetes: none known    Current Diabetes Inpatient Regimen:  - Lispro 2:50>150 qACHS    Current Nutrition:  Active Orders   Diet    Nutrition Therapy Regular/House       ROS: As per HPI, otherwise 10 system ROS was negative.    ??? acetaminophen  1,000 mg Oral Once   ??? acetaminophen  1,000 mg Oral Q8H SCH   ??? amLODIPine  10 mg Oral Daily   ??? docusate sodium  100 mg Oral BID   ??? gabapentin  100 mg Oral Nightly   ??? insulin lispro  0-5 Units Subcutaneous ACHS   ??? mycophenolate  750 mg Oral BID   ??? nystatin  10 mL Oral Q8H SCH   ??? pantoprazole  40 mg Oral Daily   ??? polyethylene glycol  17 g Oral BID   ??? pravastatin  40 mg Oral Nightly   ??? sulfamethoxazole-trimethoprim  1 tablet Oral Q MWF   ??? tacrolimus  6 mg Oral daily   ??? valGANciclovir  450 mg Oral Tue,Sat       Past Medical History:   Diagnosis Date   ??? Anemia    ??? Cancer (CMS-HCC)     clear cell adenocarcinoma of left kidney   ??? Diabetes mellitus (CMS-HCC)    ???  Diabetic nephropathy (CMS-HCC)    ??? ESRD (end stage renal disease) on dialysis (CMS-HCC)    ??? ESRD on dialysis (CMS-HCC)    ??? Gout    ??? Hyperlipidemia    ??? Hypertension        Family History   Problem Relation Age of Onset   ??? Kidney disease Mother    ??? Cancer Father    ??? Kidney disease Brother        Social History     Tobacco Use   ??? Smoking status: Never Smoker   ??? Smokeless tobacco: Never Used   Substance Use Topics   ??? Alcohol use: No   ??? Drug use: No       OBJECTIVE: Patient was seen face to face. Full exam not completed due to COVID-19/limiting PPE and exposure.  BP 146/72  - Pulse 75  - Temp 36.6 ??C (97.9 ??F) (Oral)  - Resp 16  - Ht 170.2 cm (5' 7)  - Wt 80.8 kg (178 lb 3.2 oz)  - SpO2 100%  - BMI 27.91 kg/m??   Wt Readings from Last 12 Encounters: 06/21/19 80.8 kg (178 lb 3.2 oz)   08/20/18 81.7 kg (180 lb 1.9 oz)   04/30/18 79.4 kg (175 lb)   01/30/18 80.7 kg (177 lb 14.4 oz)   08/14/17 81.6 kg (180 lb)   07/24/17 82.6 kg (182 lb)   04/05/17 81.6 kg (180 lb)   02/08/17 78.9 kg (174 lb)   12/27/16 79.4 kg (175 lb)   12/12/16 78.9 kg (174 lb)   09/21/16 78.4 kg (172 lb 12.8 oz)   07/06/16 73.8 kg (162 lb 11.2 oz)       General: NAD, sitting up in chair  Lungs: normal WOB  Skin: no visible lesions or rashes, warm and dry  Psych: pleasant, appropriate mood and affect  Neuro: A&Ox3.    Data Review    BG/insulin reviewed per EMR.   Glucose, POC   Date Value   06/25/2019 184 mg/dL (H)   96/11/5407 811 mg/dL (H)   91/47/8295 621 mg/dL   30/86/5784 87 mg/dL   69/62/9528 413 mg/dL (H)   24/40/1027 253 mg/dL   66/44/0347 425 mg/dL   95/63/8756 433 mg/dL   29/51/8841 660 MG/DL (H)   63/08/6008 55 MG/DL (L)   93/23/5573 52 MG/DL (L)   22/09/5425 96 MG/DL   01/28/7627 315 MG/DL   17/61/6073 710 MG/DL   62/69/4854 627 MG/DL (H)   03/50/0938 91 MG/DL        Summary of labs:  Lab Results   Component Value Date    A1C 6.2 (H) 06/21/2019    A1C 5.7 (H) 08/20/2018    A1C 6.8 (H) 12/14/2015     Lab Results   Component Value Date    GFR 5.11 (L) 05/01/2012    CREATININE 7.49 (H) 06/26/2019     Lab Results   Component Value Date    WBC 11.9 (H) 06/26/2019    HGB 9.1 (L) 06/26/2019    HCT 28.0 (L) 06/26/2019    PLT 212 06/26/2019       Lab Results   Component Value Date    NA 137 06/26/2019    K 4.4 06/26/2019    CL 101 06/26/2019    CO2 23.0 06/26/2019    BUN 82 (H) 06/26/2019    CREATININE 7.49 (H) 06/26/2019    GLU 120 06/26/2019    CALCIUM 8.7 06/26/2019    MG 1.8 06/26/2019  PHOS 7.2 (H) 06/26/2019       Lab Results   Component Value Date    BILITOT 0.7 06/21/2019    BILIDIR 0.30 08/20/2018    PROT 8.0 06/21/2019    ALBUMIN 4.5 06/21/2019    ALT 29 06/21/2019    AST 30 06/21/2019    ALKPHOS 127 (H) 06/21/2019    GGT 20 06/21/2019       Lab Results   Component Value Date LABPROT 11.0 05/14/2013    INR 1.03 06/21/2019    APTT 28.2 06/21/2019

## 2019-06-26 NOTE — Unmapped (Signed)
Met with patient and spouse earlier this afternoon and reviewed upcoming appts, labs. Pt and spouse verbalize understanding. Pt reported [facility] RN already taught JP drain care.   Caryl Ada Inpatient Transplant Nurse Coordinator 06/26/2019 3:39 PM    Education Follow up Assessment (Patient may utilize resources: family, booklet, med list.)   Re-educate on all incorrect responses and reassess those at conclusion of session.       1) Who do you call on nights, weekends, and holidays if you are having a transplant issue? On call coordinator     2) Name 2 reasons to call the on-call coordinator. Reviewed with pt page 2    3) When do you take your morning medicines on lab draw days? after     4) Can you name one your anti-rejection medicines? Not assessed    5) Can you name one your anti-infectious medicines? Not assessed    6) What are you responsible for monitoring when you are at home? Blood pressure; temp; weight; intake and outpt     7) How do you prevent rejection? do lab work or equivalent    8) Have you spoken with a dietician about safe food handling?  yes     9) Do you have any questions about safe food handling?  no    10) Have you spoken with a pharmacist?  yes    11) Do you feel comfortable going home?  yes    Caryl Ada Inpatient Abdominal Transplant Nurse Coordinator 06/26/2019 3:39 PM

## 2019-06-26 NOTE — Unmapped (Signed)
Discharge Summary    Admit date: 06/21/2019    Discharge date and time: 06/26/2019 5:00 PM     Discharge to:  Home    Discharge Service: Surg Transplant [facility])    Discharge Attending Physician: Lilyan Punt [facility]    Discharge  Diagnoses: ESRD s/p Renal Transplantation    Secondary Diagnosis: Principal Problem:    Kidney replaced by transplant POA: Not Applicable  Resolved Problems:    * No resolved hospital problems. *      OR Procedures:    RENAL ALLOTRANSPLANTATION, IMPLANTATION OF GRAFT; WITHOUT RECIPIENT NEPHRECTOMY  BACKBENCH STD PREP LIVING DONOR RENAL ALLGRFT (OPEN/LAPROSC) PRIOR TO TRANSPLANT, INC DISSECT/REM AS NECESS  Date  June 30, 2019  -------------------     Ancillary Procedures: no procedures    Discharge Day Services:     Subjective   No acute events overnight. Pain Controlled. No fever or chills.    Objective   Patient Vitals for the past 8 hrs:   BP Temp Temp src Pulse Resp SpO2 Weight   06/26/19 1056 133/61 36.3 ??C Oral 73 16 100 % ???   06/26/19 0809 ??? ??? ??? ??? ??? ??? 87.8 kg (193 lb 9.6 oz)   06/26/19 0749 148/83 36.4 ??C Oral 76 20 100 % ???     I/O this shift:  In: 1200 [P.O.:1200]  Out: 565 [Urine:425; Drains:140]    General Appearance:   No acute distress  Lungs:                Clear to auscultation bilaterally  Heart:                           Regular rate and rhythm  Abdomen:                Soft, non-tender, non-distended. Incisions C/D/I  Extremities:              Warm and well perfused Hospital Course:  Frank Leach is a 68 year old male with past history of HTN, DM, RCC s/p bilateral nephrectomy who is on iHD MWF via L forearm AVF. The patient was taken to the OR on 06/30/23 for deceased donor kidney transplantation. He tolerated the procedure well, was extubated in the OR, and was taken to the PACU where He received routine postoperative care. He was then transferred to the Surgical Stepdown unit for close observation and cardiorespiratory monitoring. The allograft was evaluated with ultrasonography and found to have good perfusion, high-normal resistive indices, and patent vasculature.   His potassium remained elevated following the operation, he was shifted, and given lasix IV, but K remained elevated and he was dyalized on POD1.   He was initially placed on 1:1 UOP/IVF replacement and then transitioned to 0.9% NS. He was clinically stable postoperatively, maintaining adequate urine output, and was transferred to the floor on POD#1 . He did well thereafter and  diet was slowly advanced and at the time of discharge He was tolerating a regular diet. The patient was able to void spontaneously following discontinuation of Foley catheter POD#2, have his pain controlled with PO pain medication, and return to the preoperative ambulatory status. Anti-rejection medication levels were monitored and dosages adjusted to maintain a therapeutic regimen. The patient was seen and assessed by Physical Therapy and deemed suitable for discharge. He will be discharged home on POD#4 in stable condition without home healthcare.        Condition at Discharge: Improved  Discharge  Medications:      Medication List      START taking these medications    ??? acetaminophen 500 MG tablet; Commonly known as: TYLENOL; Take 1-2   tablets (500-1,000 mg total) by mouth every six (6) hours as needed for   pain or fever (> 38C).  ??? aspirin 81 MG tablet; Commonly known as: ECOTRIN; Take 1 tablet (81 mg total) by mouth daily.; Replaces: aspirin 81 MG chewable tablet  ??? DOK 100 MG capsule; Generic drug: docusate sodium; Take 1 capsule (100   mg total) by mouth two (2) times a day as needed for constipation.  ??? * ENVARSUS XR 1 mg Tb24 extended release tablet; Generic drug:   tacrolimus; Take 3 mg by mouth daily. Patient: Please take as   prescribed-follow your medication card.  Z94.0 Total dose: 15mg  (Take 3   tabs of 4mg  tabs in addition to 3 tabs of the 1mg  tabs)  ??? * ENVARSUS XR 4 mg Tb24 extended release tablet; Generic drug:   tacrolimus; Take 3 tablets (12 mg) by mouth daily. (total dose 15mg - take   with 1mg  tablets)  ??? gabapentin 100 MG capsule; Commonly known as: NEURONTIN; Take 1 capsule   (100 mg total) by mouth nightly for 12 days.  ??? magnesium (amino acid chelate) 133 mg Tab; Generic drug: magnesium   oxide-Mg AA chelate; Take 1 tablet by mouth Two (2) times a day. HOLD   until directed to start by your coordinator.  ??? mycophenolate 180 MG EC tablet; Commonly known as: MYFORTIC; Take 3   tablets (540 mg total) by mouth Two (2) times a day.  ??? polyethylene glycol 17 gram/dose powder; Commonly known as: GLYCOLAX;   Mix 17 g in 8 ounces of water or juice and drink by mouth daily as needed.  ??? sulfamethoxazole-trimethoprim 400-80 mg per tablet; Commonly known as:   BACTRIM; Take 1 tablet (80 mg of trimethoprim total) by mouth Every   Monday, Wednesday, and Friday.; Start taking on: June 27, 2019  ??? traMADoL 50 mg tablet; Commonly known as: ULTRAM; Take 1-2 tablets   (50-100 mg total) by mouth two (2) times a day as needed for other (pain).  ??? valGANciclovir 450 mg tablet; Commonly known as: VALCYTE; Take 1 tablet   (450 mg total) by mouth daily.  * This list has 2 medication(s) that are the same as other medications   prescribed for you. Read the directions carefully, and ask your doctor or   other care provider to review them with you.     CHANGE how you take these medications ??? amLODIPine 10 MG tablet; Commonly known as: NORVASC; Take 1 tablet (10   mg total) by mouth daily.; What changed: medication strength, how much to   take     CONTINUE taking these medications    ??? allopurinoL 100 MG tablet; Commonly known as: ZYLOPRIM  ??? cinacalcet 60 MG tablet; Commonly known as: SENSIPAR  ??? omeprazole 40 MG capsule; Commonly known as: PriLOSEC  ??? pravastatin 40 MG tablet; Commonly known as: PRAVACHOL     STOP taking these medications    ??? aspirin 81 MG chewable tablet; Replaced by: aspirin 81 MG tablet  ??? calcitrioL 0.5 MCG capsule; Commonly known as: ROCALTROL  ??? ferric citrate 210 mg iron Tab tablet; Commonly known as: AURYXIA  ??? heparin (porcine) 1,000 unit/mL 1000 unit/mL injection  ??? RENA-VITE 0.8 mg Tab; Generic drug: B complex-vitamin C-folic acid  ??? sevelamer 800 mg tablet; Commonly  known as: RENVELA       Pending Test Results:     Discharge Instructions:  Activity:     Diet:    Other Instructions:  Other Instructions     Discharge instructions      Activity: Do not lift > 10-15 lbs for 1st 6 weeks, then gradually increase to 25 lbs over the following 6 weeks. Resume heavy lifting only after being cleared to do so at follow-up appointment in Transplant Surgery clinic.    Diet: Regular    Other Instructions: Monitor and record JP drain output daily. Empty JP drain 2-4 times per day as needed.    Your Post-Transplant Coordinator is Cecil Cranker. Contact your transplant coordinator or the Transplant Surgery Office [phone]) during business hours or page the transplant coordinator on call [phone]) after business hours for:    - fever >100.5 degrees F by mouth, any fever with shaking chills, or other signs or symptoms of infection   - uncontrolled nausea, vomiting, or diarrhea; inability to have a bowel movement for > 3 days.   - any problem that prevents taking medications as scheduled. - pain uncontrolled with prescribed medication or new pain or tenderness at the surgical site   - sudden weight gain or increase in blood pressure (greater than 140/85)   - shortness of breath, chest pain / discomfort   - new or increasing jaundice   - urinary symptoms including pain / difficulty / burning or tea-colored urine   - any other new or concerning symptoms   - questions regarding your medications or continuing care      Patient may shower, but should not immerse wounds in bath or pool for 2-3 weeks. Wash the surgical site with mild soap and water, but do not scrub vigorously.    You may dress wounds with dry gauze and tape to avoid soilage.    Do not drive or operate heavy machinery prior to MD clearance, or at any time while taking narcotics.    Inspect surgical sites at least twice daily, contact Transplant Coordinator for spreading redness, purulent discharge, or increasing bleeding or drainage, or for separation of wounds.     Maintain a written record of daily vital signs, per Handbook instructions.     Maintain a written record of medications taken and review against the discharge medications sheet (orange paper). Periodically review your Transplant Handbook for important information regarding postoperative care and required precautions.      Labs and Other Follow-ups after Discharge:   Kidney  Labs 3x week: CBC, BMP, Mg, Phos and Tacrolimus level  Labs every 3 months: Hepatic function panel    Transplant Coordinator:  Cecil Cranker- phone: [phone] fax: [phone]             Labs and Other Follow-ups after Discharge:  Follow Up instructions and Outpatient Referrals     Discharge instructions            Future Appointments:  Appointments which have been scheduled for you    Jul 02, 2019 10:00 AM  (Arrive by 9:30 AM)  LAB ONLY with SURTRA LAB ONLY  [facility] TRANSPLANT SURGERY [place] [facility] REGION) [address]  [place] [address]  [phone] Jul 02, 2019 10:30 AM  (Arrive by 10:00 AM)  RETURN 15 with Loney Hering, MD  [facility] TRANSPLANT SURGERY  [facility] REGION) [address]  [place] [address]  [phone]      Jul 29, 2019 10:15 AM  (Arrive by 9:45 AM)  LAB ONLY with SURTRA LAB ONLY  [facility] TRANSPLANT SURGERY [place] [facility] REGION) [address]  [place] [address]  [phone]      Jul 29, 2019 10:30 AM  (Arrive by 10:00 AM)  RETURN PHARMD with Wallace Cullens Mincemoyer, CPP  [facility] KIDNEY TRANSPLANT [place] REGION) [address]  [place] [address]  [phone]      Jul 29, 2019 11:00 AM  (Arrive by 10:30 AM)  RETURN  GENERAL with Cordelia Poche, AGNP  [facility] KIDNEY TRANSPLANT [place] [facility] REGION) [address]  [place] [address]  [phone]      Jul 29, 2019  1:15 PM  (Arrive by 12:45 PM)  CYSTO LOCAL with Marilynne Drivers, MD  [facility] UROLOGY PROCEDURES [place] [facility] REGION) [address]  [place] [address]  [phone]

## 2019-06-26 NOTE — Unmapped (Signed)
Patient will fill refills at [facility]. Enrolling patient in specialty calls

## 2019-06-26 NOTE — Unmapped (Signed)
Pharmacist Discharge Note for Kidney Transplant Recipient  Date of admission to [facility]: 06/21/2019  Reason for writing this note: patient requires medication-related outpatient intervention and/or monitoring    Reason for Admission: s/p deceased kidney transplant on 07-06-19, 2/2 HTN  KDPI 42%, cPRA 0%; Induction: Campath    Discharge Date: 06/26/19    Past Medical History:   Diagnosis Date   ??? Anemia    ??? Cancer (CMS-HCC)     clear cell adenocarcinoma of left kidney   ??? Diabetes mellitus (CMS-HCC)    ??? Diabetic nephropathy (CMS-HCC)    ??? ESRD (end stage renal disease) on dialysis (CMS-HCC)    ??? ESRD on dialysis (CMS-HCC)    ??? Gout    ??? Hyperlipidemia    ??? Hypertension        Immunosuppression regimen:  Envarsus XR 8 mg daily (dose increased day of discharge); goal 8-10  Tacrolimus level 11/19: 2.1 ng/mL  Myfortic 540 mg BID    Antimicrobials during admission:   CMV: D+/R+ -> Valcyte x 3 months  PJP: Bactrim SS MWF x 6 months  Nystatin while inpatient    Medication changes to be instituted upon discharge:  New medications-  - Immunosuppression: as above  - Antimicrobials as above  - Bowel regimen: Docusate 100 mg BID PRN and Miralax 17 gm daily PRN  - Pain regimen: APAP 1 gm TID then PRN, tramadol PRN, gabapentin x 14 days  - Ppx: Omeprazole 40 mg daily  - CV/heme: aspirin 81 mg daily    Continued home medications:  -Pravastatin 40 mg daily  -Allopurinol 100 mg daily    Changes in home medications:  -Amlodipine 10 mg daily    Home medications stopped:   -Calcitriol  -Auryxia  -RenaVite  -Renvela    Potential adverse effects during admission  Since last visit, does patient have YES NO Tac Dose Modification NOTES      Increase Decrease No Change    1 Neurotoxicity (tremor, paresthesias, tingling, seizures, or headache) []  [x]  []  []  [x]     2 Nephrotoxicity related to tacrolimus []  [x]  []  []  [x]     3 Diarrhea, constipation []  [x]  []  []  [x]     4 Peripheral edema []  [x]  []  []  [x]     5 Hypertension []  [x]  []  []  [x]  6 Hyperglycemia []  [x]  []  []  [x]     7 Other adverse events []  [x]  []  []  [x]         Suggested monitoring for outpatient follow-up:   [ ]  Tacrolimus levels and adjustments as needed  [ ]  Blood pressure monitoring    Crista Curb,  PharmD

## 2019-06-26 NOTE — Unmapped (Signed)
Transferred from ISCU this shift. VSS. Afebrile. Stable on RA. Pt denies pain at this time. Foley. JP in place. Left transverse incision is SOTA and C/D/I. IVF running at 50 mL/hr. Tolerating regular diet w/ no complaints of nausea. Standby assist. Will continue to monitor. Wife at bedside. Protective precautions maintained.       Problem: Adult Inpatient Plan of Care  Goal: Plan of Care Review  Outcome: Progressing  Goal: Patient-Specific Goal (Individualization)  Outcome: Progressing  Goal: Absence of Hospital-Acquired Illness or Injury  Outcome: Progressing  Goal: Optimal Comfort and Wellbeing  Outcome: Progressing  Goal: Readiness for Transition of Care  Outcome: Progressing  Goal: Rounds/Family Conference  Outcome: Progressing     Problem: Self-Care Deficit  Goal: Improved Ability to Complete Activities of Daily Living  Outcome: Progressing     Problem: Venous Thromboembolism  Goal: VTE (Venous Thromboembolism) Symptom Resolution  Outcome: Progressing     Problem: Pain Acute  Goal: Optimal Pain Control  Outcome: Progressing     Problem: Fall Injury Risk  Goal: Absence of Fall and Fall-Related Injury  Outcome: Progressing     Problem: Skin Injury Risk Increased  Goal: Skin Health and Integrity  Outcome: Progressing     Problem: Adjustment to Transplant (Kidney Transplant)  Goal: Optimal Coping with Organ Transplant  Outcome: Progressing     Problem: Donor Organ Function (Kidney Transplant)  Goal: Effective Renal Function  Outcome: Progressing     Problem: Infection (Kidney Transplant)  Goal: Absence of Infection Signs/Symptoms  Outcome: Progressing     Problem: Device-Related Complication Risk (Hemodialysis)  Goal: Safe, Effective Therapy Delivery  Outcome: Progressing     Problem: Hemodynamic Instability (Hemodialysis)  Goal: Vital Signs Remain in Desired Range  Outcome: Progressing     Problem: Infection (Hemodialysis)  Goal: Absence of Infection Signs/Symptoms  Outcome: Progressing

## 2019-06-26 NOTE — Unmapped (Signed)
TRANSPLANT SURGERY PROGRESS NOTE    Admit Date: 06/21/2019, Hospital Day: 5  Hospital Service: Surg Transplant [facility])      Assessment     Frank Leach is a 68 y.o. male with history of HTN, DM, RCC s/p bilateral nephrectomy who is on iHD MWF via L forearm AVF who presents for renal transplant.      Plan     - Pain control with tylenol, tramadol, dilaudid, gabapentin  - Amlodipine 5  - Regular diet  -  NS @ 50cc/hr  - Replete Mg  - Bowel reg  - Lasix 80 IV x1 11/18  - Potassium; plan for dialysis if K continues to be >5  - Endocrine consult, ISS lispro  - Induced with campath, solumedrol taper  - Start tac, cellcept  - holding bactrim due to hyperkalemia, valcyte  -Low UOP 11/17, renal dopplers stable from previous, improved UOP 11/18 with good response to IV lasix    Please page SRF [phone] with questions or concerns.      Subjective     No acute events overnight. Afebrile, VSS. Feels well this morning. Pain tolerable. Endorses flatus and BM. Interval low urine output overnight     Objective     Vitals:   Temp:  [36.4 ??C-37 ??C] 36.4 ??C  Heart Rate:  [70-79] 79  Resp:  [18-25] 23  BP: (143-164)/(58-105) 148/105  MAP (mmHg):  [87-117] 117  SpO2:  [96 %-98 %] 98 %    Intake/Output last 24 hours:  I/O last 3 completed shifts:  In: 3898.3 [P.O.:2180; I.V.:1718.3]  Out: 2100 [Urine:1635; Drains:465]    Physical Exam:    General:  Alert and oriented, in no apparent distress.   Pulmonary: Normal work of breathing. No accessory muscle use. Symmetrical chest rise and fall.   CV: No lower extremity edema bilaterally. Bilateral palpable femoral and DP pulses.   Abdomen: Soft, non-tender, non-distended. No rebound or guarding.   Extremities: Warm, well perfused, normal skin turgor.    Data Review: Labs and imaging personally reviewed.

## 2019-06-27 ENCOUNTER — Encounter: Admit: 2019-06-27 | Discharge: 2019-06-28 | Payer: MEDICARE

## 2019-06-27 LAB — RENAL FUNCTION PANEL
ALBUMIN: 3.5 g/dL (ref 3.5–5.0)
BUN / CREAT RATIO: 13
CALCIUM: 9.2 mg/dL (ref 8.5–10.2)
CHLORIDE: 106 mmol/L (ref 98–107)
CO2: 24 mmol/L (ref 22.0–30.0)
CREATININE: 5.81 mg/dL — ABNORMAL HIGH (ref 0.70–1.30)
EGFR CKD-EPI AA MALE: 11 mL/min/{1.73_m2} — ABNORMAL LOW (ref >=60)
EGFR CKD-EPI NON-AA MALE: 9 mL/min/{1.73_m2} — ABNORMAL LOW (ref >=60)
GLUCOSE RANDOM: 97 mg/dL (ref 70–179)
PHOSPHORUS: 5.7 mg/dL — ABNORMAL HIGH (ref 2.9–4.7)
POTASSIUM: 4.2 mmol/L (ref 3.5–5.0)
SODIUM: 139 mmol/L (ref 135–145)

## 2019-06-27 LAB — CBC W/ AUTO DIFF
BASOPHILS ABSOLUTE COUNT: 0 10*9/L (ref 0.0–0.1)
BASOPHILS RELATIVE PERCENT: 0.3 %
EOSINOPHILS RELATIVE PERCENT: 0.3 %
HEMATOCRIT: 27.5 % — ABNORMAL LOW (ref 41.0–53.0)
HEMOGLOBIN: 9.4 g/dL — ABNORMAL LOW (ref 13.5–17.5)
LARGE UNSTAINED CELLS: 1 % (ref 0–4)
LYMPHOCYTES ABSOLUTE COUNT: 0.1 10*9/L — ABNORMAL LOW (ref 1.5–5.0)
LYMPHOCYTES RELATIVE PERCENT: 0.8 %
MEAN CORPUSCULAR HEMOGLOBIN CONC: 34.1 g/dL (ref 31.0–37.0)
MEAN CORPUSCULAR HEMOGLOBIN: 33.4 pg (ref 26.0–34.0)
MEAN CORPUSCULAR VOLUME: 98.1 fL (ref 80.0–100.0)
MEAN PLATELET VOLUME: 8.1 fL (ref 7.0–10.0)
MONOCYTES ABSOLUTE COUNT: 0.3 10*9/L (ref 0.2–0.8)
MONOCYTES RELATIVE PERCENT: 3.9 %
NEUTROPHILS RELATIVE PERCENT: 94.1 %
PLATELET COUNT: 248 10*9/L (ref 150–440)
RED BLOOD CELL COUNT: 2.81 10*12/L — ABNORMAL LOW (ref 4.50–5.90)
RED CELL DISTRIBUTION WIDTH: 15.4 % — ABNORMAL HIGH (ref 12.0–15.0)
WBC ADJUSTED: 7.1 10*9/L (ref 4.5–11.0)

## 2019-06-27 LAB — MAGNESIUM: Magnesium:MCnc:Pt:Ser/Plas:Qn:: 1.8

## 2019-06-27 LAB — URINALYSIS
BACTERIA: NONE SEEN /HPF
BILIRUBIN UA: NEGATIVE
GLUCOSE UA: NEGATIVE
KETONES UA: NEGATIVE
NITRITE UA: NEGATIVE
PH UA: 6 (ref 5.0–9.0)
PROTEIN UA: 30 — AB
RBC UA: >100 /HPF — ABNORMAL HIGH (ref <3)
SPECIFIC GRAVITY UA: 1.015 (ref 1.005–1.040)
SQUAMOUS EPITHELIAL: 2 /HPF (ref 0–5)
UROBILINOGEN UA: 0.2
WBC UA: 25 /HPF — ABNORMAL HIGH (ref <2)

## 2019-06-27 LAB — PROTEIN URINE: Protein:MCnc:Pt:Urine:Qn:: 51.2

## 2019-06-27 LAB — WBC UA: Leukocytes:Naric:Pt:Urine sed:Qn:Microscopy.light.HPF: 25 — ABNORMAL HIGH

## 2019-06-27 LAB — MACROCYTES

## 2019-06-27 LAB — PROTEIN / CREATININE RATIO, URINE: CREATININE, URINE: 106.5 mg/dL

## 2019-06-27 LAB — CO2: Carbon dioxide:SCnc:Pt:Ser/Plas:Qn:: 24

## 2019-06-27 MED ORDER — SULFAMETHOXAZOLE 400 MG-TRIMETHOPRIM 80 MG TABLET
ORAL_TABLET | ORAL | 5 refills | 28.00000 days | Status: CP
Start: 2019-06-27 — End: 2019-12-24
  Filled 2019-06-26 – 2019-07-24 (×2): qty 12, 28d supply, fill #0

## 2019-06-28 LAB — TACROLIMUS BLOOD: Lab: 2.6

## 2019-06-30 ENCOUNTER — Encounter: Admit: 2019-06-30 | Discharge: 2019-07-01 | Payer: MEDICARE

## 2019-06-30 LAB — CALCIUM: Calcium:MCnc:Pt:Ser/Plas:Qn:: 9.6

## 2019-06-30 LAB — CBC W/ AUTO DIFF
BASOPHILS ABSOLUTE COUNT: 0 10*9/L (ref 0.0–0.1)
BASOPHILS RELATIVE PERCENT: 0.4 %
EOSINOPHILS ABSOLUTE COUNT: 0.2 10*9/L (ref 0.0–0.4)
EOSINOPHILS RELATIVE PERCENT: 2.6 %
HEMOGLOBIN: 8.8 g/dL — ABNORMAL LOW (ref 13.5–17.5)
LARGE UNSTAINED CELLS: 1 % (ref 0–4)
LYMPHOCYTES ABSOLUTE COUNT: 0.1 10*9/L — ABNORMAL LOW (ref 1.5–5.0)
MEAN CORPUSCULAR HEMOGLOBIN CONC: 33.5 g/dL (ref 31.0–37.0)
MEAN CORPUSCULAR VOLUME: 99.6 fL (ref 80.0–100.0)
MEAN PLATELET VOLUME: 8.1 fL (ref 7.0–10.0)
MONOCYTES ABSOLUTE COUNT: 0.1 10*9/L — ABNORMAL LOW (ref 0.2–0.8)
MONOCYTES RELATIVE PERCENT: 1.8 %
NEUTROPHILS ABSOLUTE COUNT: 6.3 10*9/L (ref 2.0–7.5)
NEUTROPHILS RELATIVE PERCENT: 92.5 %
PLATELET COUNT: 362 10*9/L (ref 150–440)
RED BLOOD CELL COUNT: 2.65 10*12/L — ABNORMAL LOW (ref 4.50–5.90)
RED CELL DISTRIBUTION WIDTH: 15.8 % — ABNORMAL HIGH (ref 12.0–15.0)
WBC ADJUSTED: 6.8 10*9/L (ref 4.5–11.0)

## 2019-06-30 LAB — RBC UA: Erythrocytes:Naric:Pt:Urine sed:Qn:Microscopy.light.HPF: >100 — ABNORMAL HIGH

## 2019-06-30 LAB — TACROLIMUS BLOOD: Lab: 2.4

## 2019-06-30 LAB — URINALYSIS
BILIRUBIN UA: NEGATIVE
GLUCOSE UA: NEGATIVE
KETONES UA: NEGATIVE
NITRITE UA: NEGATIVE
PH UA: 6 (ref 5.0–9.0)
PROTEIN UA: 30 — AB
RBC UA: >100 /HPF — ABNORMAL HIGH (ref <3)
SPECIFIC GRAVITY UA: 1.02 (ref 1.005–1.040)
SQUAMOUS EPITHELIAL: 8 /HPF — ABNORMAL HIGH (ref 0–5)
UROBILINOGEN UA: 0.2
WBC UA: >100 /HPF — ABNORMAL HIGH (ref <2)

## 2019-06-30 LAB — PROTEIN / CREATININE RATIO, URINE: PROTEIN URINE: 46.5 mg/dL

## 2019-06-30 LAB — MAGNESIUM: Magnesium:MCnc:Pt:Ser/Plas:Qn:: 1.7

## 2019-06-30 LAB — RENAL FUNCTION PANEL
ANION GAP: 9 mmol/L (ref 7–15)
BLOOD UREA NITROGEN: 52 mg/dL — ABNORMAL HIGH (ref 7–21)
BUN / CREAT RATIO: 19
CALCIUM: 9.6 mg/dL (ref 8.5–10.2)
CO2: 24 mmol/L (ref 22.0–30.0)
EGFR CKD-EPI AA MALE: 27 mL/min/{1.73_m2} — ABNORMAL LOW (ref >=60)
EGFR CKD-EPI NON-AA MALE: 24 mL/min/{1.73_m2} — ABNORMAL LOW (ref >=60)
GLUCOSE RANDOM: 107 mg/dL (ref 70–179)
PHOSPHORUS: 3.9 mg/dL (ref 2.9–4.7)
POTASSIUM: 4.6 mmol/L (ref 3.5–5.0)
SODIUM: 143 mmol/L (ref 135–145)

## 2019-06-30 LAB — PROTEIN URINE: Protein:MCnc:Pt:Urine:Qn:: 46.5

## 2019-06-30 LAB — MONOCYTES ABSOLUTE COUNT: Monocytes:NCnc:Pt:Bld:Qn:Automated count: 0.1 — ABNORMAL LOW

## 2019-07-01 DIAGNOSIS — Z940 Kidney transplant status: Principal | ICD-10-CM

## 2019-07-01 MED ORDER — ENVARSUS XR 4 MG TABLET,EXTENDED RELEASE
ORAL_TABLET | 11 refills | 0 days | Status: CP
Start: 2019-07-01 — End: ?

## 2019-07-01 MED ORDER — ENVARSUS XR 1 MG TABLET,EXTENDED RELEASE
ORAL_TABLET | 11 refills | 0 days | Status: CP
Start: 2019-07-01 — End: ?

## 2019-07-01 NOTE — Unmapped (Addendum)
The following medications were onboarded today (07/01/2019):  1. Envarsus 1mg  and 4mg   2. Mycophenolate 180mg   3. Valganciclovir 450mg     Requesting new rxs with updated directions based on what patient's wife communicated to me new doses are for:  1. Envarsus 4mg  ( she states he is taking only 8mg  daily now, no 1mg  tablets)  2. Valganciclovir 450mg  (she states he is only taking on Tues and Sat now)    [facility]   Patient Onboarding/Medication Counseling    Frank Leach is a 68 y.o. male with kidney transplant who I am counseling today on continuation of therapy.  I am speaking to patient's wife.    Verified patient's date of birth / HIPAA.    Specialty medication(s) to be sent: none to be sent at this time      Non-specialty medications/supplies to be sent: none to be sent at this time      Medications not needed at this time: wife states he needs no meds at this time - wants a call back in 1.5 weeks         Envarsus (tacrolimus XR)    Medication & Administration     Dosage: rxs we have say pt is to take 15mg  daily (3x4mg  tablets and 3x1mg  tablets) - wife states he is taking 8mg  daily only (2x4mg  tablets) -reaching out to clarify with coordinator and request new rxs     Administration:   ? Take in the morning on empty stomach ??? 1 hour before or 2 hours after breakfast  ? Take with drink of water  ? Do not crush, break, or chew tablets    Adherence/Missed dose instructions:  ? Take a missed dose as soon as you think about it.  ? If it has been more than 15 hours since missed dose, skip the missed dose and go back to your regular time  ? Report all missed doses to transplant coordinator    Goals of Therapy     ? To prevent organ rejection    Side Effects & Monitoring Parameters     ? Common side effects  ? Fatigue  ? Headache  ? Stuffy nose or sore throat  ? Nausea, vomiting, stomach pain, diarrhea, constipation  ? Heartburn  ? Back or joint pain  ? Increased risk of infection ? The following side effects should be reported to the provider:  ? Allergic reaction  ? Kidney issues (change in quantity or urine passed, blood in urine, or weight gain)  ? High blood pressure (dizziness, change in eyesight, headache)  ? Electrolyte issues (change in mood, confusion, muscle pain, or weakness)  ? Abnormal breathing  ? Shakiness  ? Unexplained bleeding or bruising (gums bleeding, blood in urine, nosebleeds, any abnormal bleeding)  ? Signs of infection    ? Monitoring Parameters  ? Renal function  ? Liver function  ? Glucose levels  ? Blood pressure  ? Tacrolimus trough levels      Contraindications, Warnings, & Precautions     ? Black Box Warning: Infections - immunosuppressant agents increase the risk of infection that may lead to hospitalization or death  ? Black Box Warning: Malignancy - immunosuppressant agents may be associated with the development of malignancies that may lead to hospitalization or death.  Limit or avoid sun and ultraviolet light exposure, use appropriate sun protection  ? Myocardial hypertrophy -avoid use in patients with congenital long QT syndrome  ? Diabetes mellitus - the risk for  new-onset diabetes and insulin-dependent post-transplant diabetes mellitus is increased with tacrolimus use after transplantation  ? GI perforation  ? Hyperkalemia  ? Hypertension  ? Nephrotoxicity  ? Neurotoxicity    Drug/Food Interactions     ? Medication list reviewed in Epic. no interactions noted that clinic is not already monitoring.   ? Due to amount and severity of drug interactions, report ALL medications starts, discontinuations, and changes to transplant coordinator prior to making the change  ? Avoid alcohol  ? Avoid grapefruit or grapefruit juice  ? Avoid live vaccines    Storage, Handling Precautions, & Disposal     ? Store at room temperature  ? Keep away from children and pets    Myfortic (mycophenolic acid)    Medication & Administration     Dosage: ? Take 3 tablets (540mg  total) by mouth twice daily - wife verified this is dose he is taking    Administration:   ? Take with or without food, although taking with food helps minimize GI side effects.  ? Swallow the pills whole, do not chew or crush    Adherence/Missed dose instructions:  ? Take a missed dose as soon as you think about it.  ? If it is less than 2 hours until your next dose, skip the missed dose and go back to your normal time.  ? Do not take 2 doses at the same time or extra doses.    Goals of Therapy     ? To prevent organ rejection    Side Effects & Monitoring Parameters     ? Common side effects  ? Back or joint pain  ? Constipation  ? Headache/dizziness  ? Not hungry  ? Stomach pain, diarrhea, constipation, gas, upset stomach, vomiting, nausea  ? Feeling tired or weak  ? Shakiness  ? Trouble sleeping  ? Increased risk of infection    ? The following side effects should be reported to the provider:  ? Allergic reaction  ? High blood sugar (confusion, feeling sleepy, more thirst, more hungry, passing urine more often, flushing, fast breathing, or breath that smells like fruit)  ? Electrolyte issues (mood changes, confusion, muscle pain or weakness, a heartbeat that does not feel normal, seizures, not hungry, or very bad upset stomach or throwing up)  ? High or low blood pressure (bad headache or dizziness, passing out, or change in eyesight)  ? Kidney issues (unable to pass urine, change in how much urine is passed, blood in the urine, or a big weight gain)  ? Skin (oozing, heat, swelling, redness, or pain), UTI and other infections   ? Chest pain or pressure  ? Abnormal heartbeat  ? Unexplained bleeding or bruising  ? Abnormal burning, numbness, or tingling  ? Muscle cramps,  ? Yellowing of skin or eyes    ? Monitoring parameters  ? Pregnancy test initially prior to treatment and 8-10 days later then as needed)  ? CBC weekly for first month then twice monthly for next 2 months, then monthly) ? Monitor Renal and liver functions  ? Signs of organ rejection    Contraindications, Warnings, & Precautions     ? *This is a REMS drug and an FDA-approved patient medication guide will be printed with each dispensation  ? Black Box Warning: Infections   ? Black Box Warning: Lymphoproliferative disorders - risk of development of lymphoma and skin malignancy is increased  ? Black Box Warning: Use during pregnancy is associated with increased risks  of first trimester pregnancy loss and congenital malformations.   ? Black Box Warning: Females of reproductive potential should use contraception during treatment and for 6 weeks after therapy is discontinued  ? CNS depression  ? New or reactivated viral infections  ? Neutropenia  ? Male patients and/or their male partners should use effective contraception during treatment of the male patient and for at least 3 months after last dose.  ? Breastfeeding is not recommended during therapy and for 6 weeks after last dose    Drug/Food Interactions     ? Medication list reviewed in Epic. The patient was instructed to inform the care team before taking any new medications or supplements. no interactions noted that clinic is not already monitoring.   ? Do not take Echinacea while on this medication  ? Check with your doctor before getting any vaccinations (live or inactivated)    Storage, Handling Precautions, & Disposal     ? Store at room temperature  ? Keep away from children and pets  ? This drug is considered hazardous and should be handled as little as possible.  Wash hands before and after touching pills. If someone else helps with medication administration, they should wear gloves.      Valcyte (valganciclovir)    Medication & Administration     Dosage:   ? rx we have says to take 1 tablet (450mg ) by mouth daily - wife states this was changed to take 1 tablet every Tuesday and Saturday - reaching out to clarify with clinic and request new rxs today    Administration: ? Take with food  ? Swallow the pills whole, do not break, crush, or chew    Adherence/Missed dose instructions:  ? Take a missed dose as soon as you think about it with food  ? If it is close to your next dose, skip the missed dose and go back to your normal time.  ? Do not take 2 doses at the same time or extra doses.  ? Report any missed doses to coordinator    Goals of Therapy     ? To prevent or treat CMV infection in setting of solid organ transplant    Side Effects & Monitoring Parameters     ? Common side effects  ? Headache  ? Diarrhea or constipation  ? Appetite or sleep disturbances  ? Back, muscle, joint, or belly pain  ? Weight loss  ? Dizziness  ? Muscle spasm  ? Upset stomach or vomiting    ? The following side effects should be reported to the provider:  ? Allergic reaction  (rash, hives, swelling, blistered or peeling skin, shortness of breath)  ? Infection (fever, chills, sore throat, ear/sinus pain, cough, sputum change, urinary pain, mouth sores, non-healing wounds)  ? Bleeding (cough ground vomit, blood in urine, black/red/tarry stools, unexplained bruising or bleeding)  ? Electrolyte problems (mood changes, confusion, weakness, abnormal heartbeat, seizures)  ? Kidney problems (urine changes, weight gain)  ? Yellowing skin or eyes  ? Swelling in arms, legs, stomach  ? Severe dizziness or passing out  ? Eye issues (eyesight changes, pain, or irritation)  ? Night sweats    ? Monitoring parameters  ? Have eye exam as directed by doctor  ? CMV counts  ? CBC  ? Renal function  ? Pregnancy test prior to initiation    Contraindications, Warnings, & Precautions     ? BBW: severe leukopenia, neutropenia, anemia, thrombocytopenia, pancytopenia, and bone marrow  failure, including aplastic anemia have been reported  ? BBW: may cause temporary or permanent inhibition of spermatogenesis and suppression of fertilty; has the potential to cause birth defects and cancers in humans ? Male patients should have pregnancy test prior to initiation and use birth control for at least 30 days after discontinuation  ? Male patients should use a barrier contraceptive while on therapy and for 90 days after discontinuation  ? Acute renal failure  ? Not indicated for use in liver transplant recipients  ? Breastfeeding is not recommended    Drug/Food Interactions     ? Medication list reviewed in Epic. The patient was instructed to inform the care team before taking any new medications or supplements. no interactions noted that clinic is not already monitoring.   ? Check with your doctor before getting any vaccinations (live or inactivated)    Storage, Handling Precautions, & Disposal     ? Store at room temperature  ? Keep away from children and pets      Current Medications (including OTC/herbals), Comorbidities and Allergies     Current Outpatient Medications   Medication Sig Dispense Refill   ??? acetaminophen (TYLENOL) 500 MG tablet Take 1-2 tablets (500-1,000 mg total) by mouth every six (6) hours as needed for pain or fever (> 38C). 100 tablet 0   ??? allopurinol (ZYLOPRIM) 100 MG tablet Take 100 mg by mouth every evening.      ??? amLODIPine (NORVASC) 10 MG tablet Take 1 tablet (10 mg total) by mouth daily. 30 tablet 11   ??? aspirin (ECOTRIN) 81 MG tablet Take 1 tablet (81 mg total) by mouth daily. 30 tablet 11   ??? cinacalcet (SENSIPAR) 60 MG tablet Take 60 mg by mouth nightly.      ??? docusate sodium (COLACE) 100 MG capsule Take 1 capsule (100 mg total) by mouth two (2) times a day as needed for constipation. 60 each 0   ??? ENVARSUS XR 1 mg Tb24 extended release tablet Take 3 mg by mouth daily. Patient: Please take as prescribed-follow your medication card.   Z94.0  Total dose: 15mg  (Take 3 tabs of 4mg  tabs in addition to 3 tabs of the 1mg  tabs) 90 tablet 11   ??? ENVARSUS XR 4 mg Tb24 extended release tablet Take 3 tablets (12 mg) by mouth daily. (total dose 15mg - take with 1mg  tablets) 90 tablet 11 ??? gabapentin (NEURONTIN) 100 MG capsule Take 1 capsule (100 mg total) by mouth nightly for 12 days. 12 capsule 0   ??? magnesium oxide-Mg AA chelate (MAGNESIUM, AMINO ACID CHELATE,) 133 mg Tab Take 1 tablet by mouth Two (2) times a day. HOLD until directed to start by your coordinator. 100 tablet 11   ??? mycophenolate (MYFORTIC) 180 MG EC tablet Take 3 tablets (540 mg total) by mouth Two (2) times a day. 180 tablet 11   ??? omeprazole (PRILOSEC) 40 MG capsule Take 40 mg by mouth nightly.      ??? polyethylene glycol (GLYCOLAX) 17 gram/dose powder Mix 17 g in 8 ounces of water or juice and drink by mouth daily as needed. 510 g 0   ??? pravastatin (PRAVACHOL) 40 MG tablet Take 40 mg by mouth nightly.      ??? sulfamethoxazole-trimethoprim (BACTRIM) 400-80 mg per tablet Take 1 tablet (80 mg of trimethoprim total) by mouth Every Monday, Wednesday, and Friday. 12 tablet 5   ??? traMADoL (ULTRAM) 50 mg tablet Take 1-2 tablets (50-100 mg total) by mouth two (2)  times a day as needed for other (pain). 30 each 0   ??? valGANciclovir (VALCYTE) 450 mg tablet Take 1 tablet (450 mg total) by mouth daily. 30 tablet 2     No current facility-administered medications for this visit.        No Known Allergies    Patient Active Problem List   Diagnosis   ??? ESRD (end stage renal disease) (CMS-HCC)   ??? Hyperlipidemia   ??? H/O splenectomy   ??? Diabetes mellitus (CMS-HCC)   ??? HTN (hypertension)   ??? Bilateral renal masses   ??? History of unilateral nephrectomy   ??? Delirium, acute   ??? 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 Kidney replaced by transplant       Reviewed and up to date in Epic.    Appropriateness of Therapy     Is medication and dose appropriate based on diagnosis? Yes    Baseline Quality of Life Assessment      How many days over the past month did your transplant keep you from your normal activities? 0    Financial Information     Medication Assistance provided: None Required Anticipated copay of $0 on mycophenolate and envarsus 1 and 4 on part b, $20.64 on valganciclovir (based on previous fills at [facility]) reviewed with patient. Verified delivery address.    Delivery Information     Scheduled delivery date: na- wife wants call back in 1.5 -2weeks    Expected start date: pt is currently already taking    Medication will be delivered via na to the na address in [place].  This shipment will for envarsus and mycophen or other meds in their shipments, will not for non part b meds shipped without  part b meds require a signature.      Explained the services we provide at [facility] and that each month we would call to set up refills.  Stressed importance of returning phone calls so that we could ensure they receive their medications in time each month.  Informed patient that we should be setting up refills 7-10 days prior to when they will run out of medication.  A pharmacist will reach out to perform a clinical assessment periodically.  Informed patient that a welcome packet and a drug information handout will be sent.      Patient verbalized understanding of the above information as well as how to contact the pharmacy at [phone] option 4 with any questions/concerns.  The pharmacy is open Monday through Friday 8:30am-4:30pm.  A pharmacist is available 24/7 via pager to answer any clinical questions they may have.    Patient Specific Needs     ? Does the patient have any physical, cognitive, or cultural barriers? No    ? Patient prefers to have medications discussed with  patient or wife     ? Is the patient able to read and understand education materials at a high school level or above? Yes    ? Patient's primary language is  English     ? Is the patient high risk? Yes, patient taking a REMS drug     ? Does the patient require a Care Management Plan? No ? Does the patient require physician intervention or other additional services (i.e. nutrition, smoking cessation, social work)? No      Thad Ranger  [facility] Specialty Pharmacist

## 2019-07-01 NOTE — Unmapped (Signed)
Prograf level is 2.4.  Pt. Currently taking 8 mg of Rnvarsus daily. Per L/ Mincemoyer, increase Envarsus to 10 mg once a day.

## 2019-07-02 ENCOUNTER — Encounter: Admit: 2019-07-02 | Discharge: 2019-07-02 | Payer: MEDICARE

## 2019-07-02 ENCOUNTER — Ambulatory Visit
Admit: 2019-07-02 | Discharge: 2019-07-02 | Payer: MEDICARE | Attending: Physician Assistant | Primary: Physician Assistant

## 2019-07-02 DIAGNOSIS — Z940 Kidney transplant status: Principal | ICD-10-CM

## 2019-07-02 DIAGNOSIS — Z0000 Encounter for general adult medical examination without abnormal findings: Principal | ICD-10-CM

## 2019-07-02 DIAGNOSIS — Z79899 Other long term (current) drug therapy: Principal | ICD-10-CM

## 2019-07-02 LAB — URINALYSIS
BILIRUBIN UA: NEGATIVE
GLUCOSE UA: NEGATIVE
KETONES UA: NEGATIVE
NITRITE UA: NEGATIVE
PH UA: 5 (ref 5.0–9.0)
PROTEIN UA: 30 — AB
RBC UA: 136 /HPF — ABNORMAL HIGH (ref <=3)
SPECIFIC GRAVITY UA: 1.011 (ref 1.003–1.030)
SQUAMOUS EPITHELIAL: <1 /HPF (ref 0–5)
UROBILINOGEN UA: 0.2
WBC UA: 4 /HPF — ABNORMAL HIGH (ref <=2)

## 2019-07-02 LAB — CBC W/ AUTO DIFF
BASOPHILS ABSOLUTE COUNT: 0.1 10*9/L (ref 0.0–0.1)
BASOPHILS RELATIVE PERCENT: 0.9 %
EOSINOPHILS ABSOLUTE COUNT: 0 10*9/L (ref 0.0–0.4)
EOSINOPHILS RELATIVE PERCENT: 0.2 %
HEMOGLOBIN: 9 g/dL — ABNORMAL LOW (ref 13.5–17.5)
LARGE UNSTAINED CELLS: 1 % (ref 0–4)
LYMPHOCYTES ABSOLUTE COUNT: 0.2 10*9/L — ABNORMAL LOW (ref 1.5–5.0)
LYMPHOCYTES RELATIVE PERCENT: 3.1 %
MEAN CORPUSCULAR HEMOGLOBIN CONC: 31.1 g/dL (ref 31.0–37.0)
MEAN CORPUSCULAR HEMOGLOBIN: 32.9 pg (ref 26.0–34.0)
MEAN CORPUSCULAR VOLUME: 105.7 fL — ABNORMAL HIGH (ref 80.0–100.0)
MEAN PLATELET VOLUME: 8.4 fL (ref 7.0–10.0)
MONOCYTES ABSOLUTE COUNT: 0.3 10*9/L (ref 0.2–0.8)
MONOCYTES RELATIVE PERCENT: 4.5 %
NEUTROPHILS ABSOLUTE COUNT: 5.2 10*9/L (ref 2.0–7.5)
NEUTROPHILS RELATIVE PERCENT: 90.4 %
PLATELET COUNT: 474 10*9/L — ABNORMAL HIGH (ref 150–440)
RED BLOOD CELL COUNT: 2.73 10*12/L — ABNORMAL LOW (ref 4.50–5.90)
RED CELL DISTRIBUTION WIDTH: 16.2 % — ABNORMAL HIGH (ref 12.0–15.0)
WBC ADJUSTED: 5.8 10*9/L (ref 4.5–11.0)

## 2019-07-02 LAB — RENAL FUNCTION PANEL
ANION GAP: 12 mmol/L (ref 7–15)
BUN / CREAT RATIO: 18
CALCIUM: 9.8 mg/dL (ref 8.5–10.2)
CHLORIDE: 113 mmol/L — ABNORMAL HIGH (ref 98–107)
CO2: 23 mmol/L (ref 22.0–30.0)
CREATININE: 2.13 mg/dL — ABNORMAL HIGH (ref 0.70–1.30)
EGFR CKD-EPI AA MALE: 36 mL/min/{1.73_m2} — ABNORMAL LOW (ref >=60)
EGFR CKD-EPI NON-AA MALE: 31 mL/min/{1.73_m2} — ABNORMAL LOW (ref >=60)
GLUCOSE RANDOM: 96 mg/dL (ref 70–179)
PHOSPHORUS: 3 mg/dL (ref 2.9–4.7)
POTASSIUM: 5.1 mmol/L — ABNORMAL HIGH (ref 3.5–5.0)
SODIUM: 148 mmol/L — ABNORMAL HIGH (ref 135–145)

## 2019-07-02 LAB — TOXIC GRANULATION

## 2019-07-02 LAB — PROTEIN / CREATININE RATIO, URINE
CREATININE, URINE: 100.1 mg/dL
PROTEIN URINE: 43.3 mg/dL

## 2019-07-02 LAB — TACROLIMUS BLOOD: Lab: 2.4

## 2019-07-02 LAB — SLIDE REVIEW

## 2019-07-02 LAB — MAGNESIUM: Magnesium:MCnc:Pt:Ser/Plas:Qn:: 1.5 — ABNORMAL LOW

## 2019-07-02 LAB — SQUAMOUS EPITHELIAL: Lab: <1

## 2019-07-02 LAB — EGFR CKD-EPI AA MALE: Lab: 36 — ABNORMAL LOW

## 2019-07-02 LAB — ANISOCYTOSIS

## 2019-07-02 LAB — PROTEIN URINE: Protein:MCnc:Pt:Urine:Qn:: 43.3

## 2019-07-02 MED ORDER — MG-PLUS-PROTEIN 133 MG TABLET
ORAL_TABLET | Freq: Every day | ORAL | 11 refills | 0.00000 days | Status: CP
Start: 2019-07-02 — End: ?

## 2019-07-02 MED ORDER — VALGANCICLOVIR 450 MG TABLET
ORAL_TABLET | ORAL | 2 refills | 60 days | Status: CP
Start: 2019-07-02 — End: 2019-09-30
  Filled 2019-07-21: qty 15, 30d supply, fill #0

## 2019-07-02 MED ORDER — ENVARSUS XR 4 MG TABLET,EXTENDED RELEASE
ORAL_TABLET | Freq: Every day | ORAL | 11 refills | 45.00000 days | Status: CP
Start: 2019-07-02 — End: ?

## 2019-07-02 MED ORDER — CINACALCET 60 MG TABLET
0 days
Start: 2019-07-02 — End: ?

## 2019-07-02 MED ORDER — ENVARSUS XR 1 MG TABLET,EXTENDED RELEASE
ORAL_TABLET | Freq: Every day | ORAL | 11 refills | 30.00000 days | Status: CP
Start: 2019-07-02 — End: ?

## 2019-07-02 NOTE — Unmapped (Signed)
TRANSPLANT SURGERY PROGRESS NOTE    Assessment and Plan  Mr Frank Leach is a 68 yo m POD#11 s/p deceased donor kidney transplant. He has had an uncomplicated post operative course and is doing well at home. His labwork is improving and indicates that his kidney is functioning well.     - JP drain removed in clinic today  - Follow up with transplant surgery clinic in 2 weeks.  - Follow up tac trough today- goal 8-10    Subjective  Frank Leach is a 68 y.o. male who presents to clinic for initial follow up. He underwent deceased donor kidney transplant 06/22/19. He had an uncomplicated postoperative course and was discharged home POD#4 with his JP drain in place. He states that he has been doing well postoperatively. He is tolerating diet. He is taking in a lot of PO fluid. He is having regular bowel function and ambulating on his own without issue. His pain is well controlled, and he is not requiring any narcotic pain medication at this time. He reports that he has been compliant with immunosuppressive medication.       Objective    Vitals:    07/02/19 0907   BP: 162/98   Pulse: 73   Temp: 37.4 ??C   TempSrc: Tympanic   Weight: 83.9 kg (185 lb)   Height: 170.2 cm (5' 7)      Body mass index is 28.98 kg/m??.     Physical Exam:    General Appearance:   No acute distress  Lungs:                Clear to auscultation bilaterally  Heart:                           Regular rate and rhythm  Abdomen:                Soft, non-tender, non-distended. LLQ incision CDI with staples in place. JP drain with scant serosanguinous output.   Extremities:              Warm and well perfused        Data Review:  All lab results last 24 hours:    Recent Results (from the past 24 hour(s))   Magnesium Level    Collection Time: 07/02/19  9:01 AM   Result Value Ref Range    Magnesium 1.5 (L) 1.6 - 2.2 mg/dL   Renal Function Panel    Collection Time: 07/02/19  9:01 AM   Result Value Ref Range    Sodium 148 (H) 135 - 145 mmol/L Potassium 5.1 (H) 3.5 - 5.0 mmol/L    Chloride 113 (H) 98 - 107 mmol/L    CO2 23.0 22.0 - 30.0 mmol/L    Anion Gap 12 7 - 15 mmol/L    BUN 38 (H) 7 - 21 mg/dL    Creatinine 1.61 (H) 0.70 - 1.30 mg/dL    BUN/Creatinine Ratio 18     EGFR CKD-EPI Non-African American, Male 31 (L) >=60 mL/min/1.47m2    EGFR CKD-EPI African American, Male 36 (L) >=60 mL/min/1.25m2    Glucose 96 70 - 179 mg/dL    Calcium 9.8 8.5 - 09.6 mg/dL    Phosphorus 3.0 2.9 - 4.7 mg/dL    Albumin 4.0 3.5 - 5.0 g/dL         Imaging:

## 2019-07-02 NOTE — Unmapped (Signed)
Urine was collected and sent to the lab.

## 2019-07-02 NOTE — Unmapped (Signed)
Change in Envarsus dosage increase. Co-pay $0.00 for both 1 and 4 mg strengths. Last filled at [facility] 06/26/2019 on Part B.

## 2019-07-02 NOTE — Unmapped (Signed)
[facility] TRANSPLANT CLINIC PHARMACY NOTE  07/02/2019   Frank Leach  [id number]    Medication changes today:   1. Increase Valcyte to 450 mg every other day  2. Start mg plus protein 133 mg once daily  3. Increase Envarsus to 11 mg daily    Education/Adherence tools provided today:  1.provided updated medication list  2. provided additional education on immunosuppression and transplant related medications including reviewing indications of medications, dosing and side effects  3.  provided additional education on magnesium    Follow up items:  1. goal of understanding indications and dosing of immunosuppression medications  2. SCr for Valcyte  3. Calcium and if need to resume Sensipar  4. BG and if need to start BG lowering therapy  5. Consider changing pravastatin to high intensity statin    Next visit with pharmacy in 1-2 weeks  ____________________________________________________________________    Frank Leach is a 68 y.o. 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     Other PMH significant for diabetes (diet controlled pre transplant), gout, HTN    Seen by pharmacy today for: medication management and pill box fill and adherence education; last seen by pharmacy first visit     CC:  Patient has no complaints today     Vitals:    07/02/19 0947   BP: 162/98   Pulse: 73   Temp: 37.4 ??C (99.3 ??F)       No Known Allergies    All medications reviewed and updated.     Medication list includes revisions made during today???s encounter    Outpatient Encounter Medications as of 07/02/2019   Medication Sig Dispense Refill   ??? acetaminophen (TYLENOL) 500 MG tablet Take 1-2 tablets (500-1,000 mg total) by mouth every six (6) hours as needed for pain or fever (> 38C). 100 tablet 0   ??? allopurinol (ZYLOPRIM) 100 MG tablet Take 100 mg by mouth every evening.      ??? amLODIPine (NORVASC) 10 MG tablet Take 1 tablet (10 mg total) by mouth daily. 30 tablet 11 ??? aspirin (ECOTRIN) 81 MG tablet Take 1 tablet (81 mg total) by mouth daily. 30 tablet 11   ??? cinacalcet (SENSIPAR) 60 MG tablet Take 60 mg by mouth nightly.      ??? docusate sodium (COLACE) 100 MG capsule Take 1 capsule (100 mg total) by mouth two (2) times a day as needed for constipation. 60 each 0   ??? ENVARSUS XR 1 mg Tb24 extended release tablet Take two 4 mg tablets and two 1 mg tablets for a total dose of 10 mg. 90 tablet 11   ??? ENVARSUS XR 4 mg Tb24 extended release tablet Take two 4 mg tablets and two 1 mg tablets for a total dail dose of 10 mg. 90 tablet 11   ??? gabapentin (NEURONTIN) 100 MG capsule Take 1 capsule (100 mg total) by mouth nightly for 12 days. 12 capsule 0   ??? magnesium oxide-Mg AA chelate (MAGNESIUM, AMINO ACID CHELATE,) 133 mg Tab Take 1 tablet by mouth Two (2) times a day. HOLD until directed to start by your coordinator. 100 tablet 11   ??? mycophenolate (MYFORTIC) 180 MG EC tablet Take 3 tablets (540 mg total) by mouth Two (2) times a day. 180 tablet 11   ??? omeprazole (PRILOSEC) 40 MG capsule Take 40 mg by mouth nightly.      ??? polyethylene glycol (GLYCOLAX) 17 gram/dose powder Mix 17  g in 8 ounces of water or juice and drink by mouth daily as needed. 510 g 0   ??? pravastatin (PRAVACHOL) 40 MG tablet Take 40 mg by mouth nightly.      ??? sulfamethoxazole-trimethoprim (BACTRIM) 400-80 mg per tablet Take 1 tablet (80 mg of trimethoprim total) by mouth Every Monday, Wednesday, and Friday. 12 tablet 5   ??? traMADoL (ULTRAM) 50 mg tablet Take 1-2 tablets (50-100 mg total) by mouth two (2) times a day as needed for other (pain). 30 each 0   ??? valGANciclovir (VALCYTE) 450 mg tablet Take 1 tablet (450 mg total) by mouth daily. 30 tablet 2     No facility-administered encounter medications on file as of 07/02/2019.        Induction agent : alemtuzumab    CURRENT IMMUNOSUPPRESSION: Envarsus 10 mg PO qd (increased from 8 mg today)  prograf/Envarsus/cyclosporine goal: 8-10   myfortic540  mg PO bid steroid free     Patient is tolerating immunosuppression well    IMMUNOSUPPRESSION DRUG LEVELS:  Lab Results   Component Value Date    Tacrolimus, Trough 2.1 (L) 06/26/2019    Tacrolimus, Trough 3.0 (L) 06/25/2019    Tacrolimus, Trough 6.5 06/24/2019    Tacrolimus, Timed 2.4 06/30/2019    Tacrolimus, Timed 2.6 06/27/2019     No results found for: CYCLO  No results found for: EVEROLIMUS  No results found for: SIROLIMUS    Envarsus level is accurate 24 hour trough    Graft function: improving  DSA: ntd  Biopsies to date: 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 Continue to monitor.    OI Prophylaxis:   CMV Status: D+/ R+, moderate risk . CMV prophylaxis: valganciclovir 450 mg two days per week x 3 months per protocol.  No results found for: CMVCP  PCP Prophylaxis: bactrim SS 1 tab MWF x 6 months.  Thrush: completed in hospital    Plan: Increase Valcyte to every other day. Continue to monitor.    CV Prophylaxis: asa 81 mg   The 10-year ASCVD risk score Denman George DC Jr., et al., 2013) is: 47%  Statin therapy: Indicated; currently on pravastatin 20 mg  Plan: Consider changing to high intensity statin at a later date. Continue to monitor     BP: Goal < 140/90. Clinic vitals reported above  Home BP ranges: 130-150/70-80s  Current meds include: amlodipine 10 mg daily  Plan: within goal and expect BP to improve with SCr. Continue to monitor    Anemia of CKD:  H/H:   Lab Results   Component Value Date    HGB 8.8 (L) 06/30/2019     Lab Results   Component Value Date    HCT 26.4 (L) 06/30/2019     Iron panel:  Lab Results   Component Value Date    IRON 92 06/25/2019    TIBC 136.1 (L) 06/25/2019    FERRITIN 1,820.0 (H) 04/20/2016     Lab Results   Component Value Date    Iron Saturation (%) 68 (H) 06/25/2019    Iron Saturation (%) 28 12/25/2011       Prior ESA use: none post txp    Plan: stable. Continue to monitor.     DM:   Lab Results Component Value Date    A1C 6.2 (H) 06/21/2019   . Goal A1c < 7  History of Dm? Yes: T2DM pre transplant  that was diet controlled  Established with endocrinologist/PCP for BG managment? No  Currently on: no meds  Home BS log: not checking  Diet: excellent appetite  Exercise:not yet  Fluid intake: 4-5 bottles daily  Plan:  Monitor FBG for potential need for DM agent    Electrolytes: mag 1.5, calcium 9.8  Meds currently on: none  Plan: Start mg plus protein 133 mg daily.  If calcium increases he still has Sensipar 30 mg daily at home and tolerated well pre transplant. Continue to monitor     GI/BM: pt reports constipation  Meds currently on: docusate PRN (using occasionally), Miralax PRN (not using), omeprazole 20 mg daily  Plan: Consider stopping omeprazole vs change to H2RA at future visit; he'd been on PPI post GIB 2/2 NSAID use many years ago. Continue to monitor    Pain: pt reports mild incisional site pain  Meds currently on: APAP PRN (not using), tramadol PRN (using 3 times since transplant), gabapentin per protocol  Plan: Encouraged APAP use prior to tramadol use Continue to monitor    Bone health:   Vitamin D Level: pending. Goal > 30.   Last DEXA results:  none available  Current meds include: none  Plan: Vitamin D level  pending. Continue to monitor.     Women's/Men's Health:  Frank Leach is a 68 y.o. male. Patient reports no men's/women's health issues  Plan: Continue to monitor    Gout - last gout flare >2 years ago  Meds currently on: allopurinol 100 mg daily  Plan: continue to monitor    Pharmacy preference:  [facility]    Adherence: Patient has poor understanding of medications; was not able to independently identify names/doses of immunosuppressants and OI meds.  Patient  does not fill their own pill box on a regular basis at home.  His wife manages meds  Patient brought medication card:yes  Pill box:did not bring Plan: Encouraged patient to start to learn meds and eventually help with pill box fill; provided extensive adherence counseling/intervention    Spent approximately 40 minutes on educating this patient and greater than 50% was spent in direct face to face counseling regarding post transplant medication education. Questions and concerns were address to patient's satisfaction.    Patient was reviewed with Dr. Dulce Sellar who was agreement with the stated plan:     During this visit, the following was completed:   BG log data assessment  BP log data assessment  Labs ordered and evaluated  complex treatment plan >1 DS   Patient education was completed for 11-24 minutes     All questions/concerns were addressed to the patient's satisfaction.  __________________________________________  Cleone Slim, PHARMD, CPP  [facility] [employer] TRANSPLANT CLINICAL PHARMACIST PRACTITIONER  PAGER [phone]

## 2019-07-03 NOTE — Unmapped (Signed)
Envarsus dose change  Last filled 6 days ago at [facility]  Copay = $0 (Med B)    Valganciclovir dose change  Last filled 6 days ago at [facility]  RFTS til 07/16/19

## 2019-07-04 LAB — VITAMIN D, TOTAL (25OH): Lab: 12.3 — ABNORMAL LOW

## 2019-07-06 LAB — HLA CL I&II, LOW RES
BW #1: 6
BW #2: 4
LOW RES DRW #1: 53
LOW RES DRW #2: 51
LOW RES HLA A #1: 33
LOW RES HLA A #2: 34
LOW RES HLA B #1: 35
LOW RES HLA B #2: 44
LOW RES HLA DQ#1: 2
LOW RES HLA DQ#2: 6
LOW RES HLA DR#1: 15
LOW RES HLA DR#2: 7

## 2019-07-06 LAB — DECEASED DONOR CL I&II, LOW RES
DONOR LOW RES DRW #1: 53
DONOR LOW RES HLA B #1: 38
DONOR LOW RES HLA B #2: 44
DONOR LOW RES HLA BW #1: 4
DONOR LOW RES HLA C #2: 12
DONOR LOW RES HLA DQ #1: 2
DONOR LOW RES HLA DQ #2: 8
DONOR LOW RES HLA DR #1: 4
DONOR LOW RES HLA DR #2: 7

## 2019-07-06 LAB — LOW RES HLA DR#1: Lab: 15

## 2019-07-06 LAB — DONOR LOW RES HLA A #1: Lab: 2

## 2019-07-07 ENCOUNTER — Ambulatory Visit: Admit: 2019-07-07 | Discharge: 2019-07-08 | Payer: MEDICARE

## 2019-07-07 LAB — CBC W/ AUTO DIFF
BASOPHILS ABSOLUTE COUNT: 0.1 10*9/L (ref 0.0–0.1)
BASOPHILS RELATIVE PERCENT: 1.3 %
EOSINOPHILS ABSOLUTE COUNT: 0.1 10*9/L (ref 0.0–0.4)
EOSINOPHILS RELATIVE PERCENT: 2.5 %
HEMATOCRIT: 26.4 % — ABNORMAL LOW (ref 41.0–53.0)
HEMOGLOBIN: 8.7 g/dL — ABNORMAL LOW (ref 13.5–17.5)
LARGE UNSTAINED CELLS: 1 % (ref 0–4)
LYMPHOCYTES ABSOLUTE COUNT: 0.1 10*9/L — ABNORMAL LOW (ref 1.5–5.0)
LYMPHOCYTES RELATIVE PERCENT: 2.3 %
MEAN CORPUSCULAR HEMOGLOBIN CONC: 33 g/dL (ref 31.0–37.0)
MEAN CORPUSCULAR HEMOGLOBIN: 33.1 pg (ref 26.0–34.0)
MEAN CORPUSCULAR VOLUME: 100.6 fL — ABNORMAL HIGH (ref 80.0–100.0)
MEAN PLATELET VOLUME: 7.5 fL (ref 7.0–10.0)
MONOCYTES ABSOLUTE COUNT: 0.2 10*9/L (ref 0.2–0.8)
MONOCYTES RELATIVE PERCENT: 3.8 %
NEUTROPHILS ABSOLUTE COUNT: 4.5 10*9/L (ref 2.0–7.5)
NEUTROPHILS RELATIVE PERCENT: 88.8 %
RED BLOOD CELL COUNT: 2.63 10*12/L — ABNORMAL LOW (ref 4.50–5.90)
RED CELL DISTRIBUTION WIDTH: 15.9 % — ABNORMAL HIGH (ref 12.0–15.0)
WBC ADJUSTED: 5.1 10*9/L (ref 4.5–11.0)

## 2019-07-07 LAB — URINALYSIS
BILIRUBIN UA: NEGATIVE
KETONES UA: NEGATIVE
LEUKOCYTE ESTERASE UA: NEGATIVE
PH UA: 5.5 (ref 5.0–9.0)
RBC UA: 61 /HPF — ABNORMAL HIGH (ref <3)
SPECIFIC GRAVITY UA: 1.02 (ref 1.005–1.040)
SQUAMOUS EPITHELIAL: 4 /HPF (ref 0–5)
UROBILINOGEN UA: 0.2
WBC UA: 5 /HPF — ABNORMAL HIGH (ref <2)

## 2019-07-07 LAB — FSAB CLASS 1 ANTIBODY SPECIFICITY: CPRA%: 0

## 2019-07-07 LAB — RENAL FUNCTION PANEL
ALBUMIN: 3.8 g/dL (ref 3.5–5.0)
ANION GAP: 9 mmol/L (ref 7–15)
BLOOD UREA NITROGEN: 22 mg/dL — ABNORMAL HIGH (ref 7–21)
BUN / CREAT RATIO: 12
CALCIUM: 9.8 mg/dL (ref 8.5–10.2)
CHLORIDE: 115 mmol/L — ABNORMAL HIGH (ref 98–107)
CO2: 21 mmol/L — ABNORMAL LOW (ref 22.0–30.0)
EGFR CKD-EPI NON-AA MALE: 39 mL/min/{1.73_m2} — ABNORMAL LOW (ref >=60)
GLUCOSE RANDOM: 114 mg/dL — ABNORMAL HIGH (ref 70–99)
PHOSPHORUS: 3.2 mg/dL (ref 2.9–4.7)
POTASSIUM: 5.1 mmol/L — ABNORMAL HIGH (ref 3.5–5.0)
SODIUM: 145 mmol/L (ref 135–145)

## 2019-07-07 LAB — MAGNESIUM: Magnesium:MCnc:Pt:Ser/Plas:Qn:: 1.2 — ABNORMAL LOW

## 2019-07-07 LAB — HLA CLASS 1 ANTIBODY RESULT: Lab: NEGATIVE

## 2019-07-07 LAB — CREATININE, URINE: Lab: 81.6

## 2019-07-07 LAB — PROTEIN / CREATININE RATIO, URINE: CREATININE, URINE: 81.6 mg/dL

## 2019-07-07 LAB — CHLORIDE: Chloride:SCnc:Pt:Ser/Plas:Qn:: 115 — ABNORMAL HIGH

## 2019-07-07 LAB — MONOCYTES ABSOLUTE COUNT: Monocytes:NCnc:Pt:Bld:Qn:Automated count: 0.2

## 2019-07-07 LAB — TACROLIMUS BLOOD: Lab: 5.8

## 2019-07-07 LAB — RBC UA: Erythrocytes:Naric:Pt:Urine sed:Qn:Microscopy.light.HPF: 61 — ABNORMAL HIGH

## 2019-07-07 LAB — TACROLIMUS LEVEL: TACROLIMUS BLOOD: 5.8 ng/mL

## 2019-07-09 ENCOUNTER — Encounter: Admit: 2019-07-09 | Discharge: 2019-07-10 | Payer: MEDICARE

## 2019-07-09 LAB — URINALYSIS
BACTERIA: NONE SEEN /HPF
BILIRUBIN UA: NEGATIVE
GLUCOSE UA: NEGATIVE
KETONES UA: NEGATIVE
LEUKOCYTE ESTERASE UA: NEGATIVE
NITRITE UA: NEGATIVE
PH UA: 6 (ref 5.0–9.0)
PROTEIN UA: NEGATIVE
SQUAMOUS EPITHELIAL: 7 /HPF — ABNORMAL HIGH (ref 0–5)
UROBILINOGEN UA: 0.2
WBC UA: 5 /HPF — ABNORMAL HIGH (ref <2)

## 2019-07-09 LAB — CBC W/ AUTO DIFF
BASOPHILS ABSOLUTE COUNT: 0.1 10*9/L (ref 0.0–0.1)
BASOPHILS RELATIVE PERCENT: 2.6 %
EOSINOPHILS ABSOLUTE COUNT: 0 10*9/L (ref 0.0–0.7)
EOSINOPHILS RELATIVE PERCENT: 0.3 %
HEMATOCRIT: 25.8 % — ABNORMAL LOW (ref 38.0–50.0)
HEMOGLOBIN: 8.7 g/dL — ABNORMAL LOW (ref 13.5–17.5)
LYMPHOCYTES ABSOLUTE COUNT: 0.1 10*9/L — ABNORMAL LOW (ref 0.7–4.0)
LYMPHOCYTES RELATIVE PERCENT: 1.7 %
MEAN CORPUSCULAR HEMOGLOBIN: 33.1 pg (ref 26.0–34.0)
MEAN CORPUSCULAR VOLUME: 98.4 fL — ABNORMAL HIGH (ref 81.0–95.0)
MEAN PLATELET VOLUME: 7.4 fL (ref 7.0–10.0)
MONOCYTES ABSOLUTE COUNT: 0.5 10*9/L (ref 0.1–1.0)
MONOCYTES RELATIVE PERCENT: 10.7 %
NEUTROPHILS ABSOLUTE COUNT: 4 10*9/L (ref 1.7–7.7)
NEUTROPHILS RELATIVE PERCENT: 84.7 %
PLATELET COUNT: 341 10*9/L (ref 150–450)
RED BLOOD CELL COUNT: 2.62 10*12/L — ABNORMAL LOW (ref 4.32–5.72)
WBC ADJUSTED: 4.7 10*9/L (ref 3.5–10.5)

## 2019-07-09 LAB — RENAL FUNCTION PANEL
ALBUMIN: 4.1 g/dL (ref 3.5–5.0)
ANION GAP: 10 mmol/L (ref 7–15)
BLOOD UREA NITROGEN: 20 mg/dL (ref 7–21)
CHLORIDE: 115 mmol/L — ABNORMAL HIGH (ref 98–107)
CO2: 22 mmol/L (ref 22.0–30.0)
CREATININE: 1.8 mg/dL — ABNORMAL HIGH (ref 0.70–1.30)
EGFR CKD-EPI AA MALE: 44 mL/min/{1.73_m2} — ABNORMAL LOW (ref >=60)
EGFR CKD-EPI NON-AA MALE: 38 mL/min/{1.73_m2} — ABNORMAL LOW (ref >=60)
GLUCOSE RANDOM: 111 mg/dL (ref 70–179)
PHOSPHORUS: 3.2 mg/dL (ref 2.9–4.7)
POTASSIUM: 5.4 mmol/L — ABNORMAL HIGH (ref 3.5–5.0)
SODIUM: 147 mmol/L — ABNORMAL HIGH (ref 135–145)

## 2019-07-09 LAB — PROTEIN / CREATININE RATIO, URINE
CREATININE, URINE: 93.9 mg/dL
PROTEIN/CREAT RATIO, URINE: 0.207

## 2019-07-09 LAB — MAGNESIUM: Magnesium:MCnc:Pt:Ser/Plas:Qn:: 1.1 — ABNORMAL LOW

## 2019-07-09 LAB — EGFR CKD-EPI NON-AA MALE: Lab: 38 — ABNORMAL LOW

## 2019-07-09 LAB — EOSINOPHILS ABSOLUTE COUNT: Eosinophils:NCnc:Pt:Bld:Qn:Automated count: 0

## 2019-07-09 LAB — TACROLIMUS BLOOD: Lab: 5.3

## 2019-07-09 LAB — SQUAMOUS EPITHELIAL: Lab: 7 — ABNORMAL HIGH

## 2019-07-09 LAB — CREATININE, URINE: Lab: 93.9

## 2019-07-11 ENCOUNTER — Encounter: Admit: 2019-07-11 | Discharge: 2019-07-12 | Payer: MEDICARE

## 2019-07-11 LAB — RENAL FUNCTION PANEL
ALBUMIN: 4.1 g/dL (ref 3.5–5.0)
ANION GAP: 9 mmol/L (ref 7–15)
BLOOD UREA NITROGEN: 21 mg/dL (ref 7–21)
BUN / CREAT RATIO: 12
CHLORIDE: 116 mmol/L — ABNORMAL HIGH (ref 98–107)
CO2: 21 mmol/L — ABNORMAL LOW (ref 22.0–30.0)
CREATININE: 1.74 mg/dL — ABNORMAL HIGH (ref 0.70–1.30)
EGFR CKD-EPI AA MALE: 46 mL/min/{1.73_m2} — ABNORMAL LOW (ref >=60)
EGFR CKD-EPI NON-AA MALE: 40 mL/min/{1.73_m2} — ABNORMAL LOW (ref >=60)
GLUCOSE RANDOM: 111 mg/dL (ref 70–179)
PHOSPHORUS: 3.3 mg/dL (ref 2.9–4.7)
POTASSIUM: 5.4 mmol/L — ABNORMAL HIGH (ref 3.5–5.0)
SODIUM: 146 mmol/L — ABNORMAL HIGH (ref 135–145)

## 2019-07-11 LAB — MANUAL DIFFERENTIAL
BASOPHILS - ABS (DIFF): 0.1 10*9/L (ref 0.0–0.1)
BASOPHILS - REL (DIFF): 3 %
EOSINOPHILS - ABS (DIFF): 0 10*9/L (ref 0.0–0.4)
EOSINOPHILS - REL (DIFF): 0 %
LYMPHOCYTES - ABS (DIFF): 0.1 10*9/L — ABNORMAL LOW (ref 1.5–5.0)
MONOCYTES - ABS (DIFF): 0.3 10*9/L (ref 0.2–0.8)
MONOCYTES - REL (DIFF): 8 %
NEUTROPHILS - ABS (DIFF): 3.3 10*9/L (ref 2.0–7.5)
NEUTROPHILS - REL (DIFF): 86 %

## 2019-07-11 LAB — CBC W/ AUTO DIFF
HEMATOCRIT: 26.5 % — ABNORMAL LOW (ref 38.0–50.0)
HEMOGLOBIN: 8.9 g/dL — ABNORMAL LOW (ref 13.5–17.5)
MEAN CORPUSCULAR HEMOGLOBIN CONC: 33.7 g/dL (ref 30.0–36.0)
MEAN CORPUSCULAR VOLUME: 98.6 fL — ABNORMAL HIGH (ref 81.0–95.0)
MEAN PLATELET VOLUME: 7.6 fL (ref 7.0–10.0)
RED BLOOD CELL COUNT: 2.68 10*12/L — ABNORMAL LOW (ref 4.32–5.72)
RED CELL DISTRIBUTION WIDTH: 14.9 % (ref 12.0–15.0)
WBC ADJUSTED: 3.8 10*9/L (ref 3.5–10.5)

## 2019-07-11 LAB — URINALYSIS
GLUCOSE UA: NEGATIVE
KETONES UA: NEGATIVE
LEUKOCYTE ESTERASE UA: NEGATIVE
NITRITE UA: NEGATIVE
PH UA: 6 (ref 5.0–9.0)
PROTEIN UA: NEGATIVE
RBC UA: >50 /HPF — ABNORMAL HIGH (ref <3)
SPECIFIC GRAVITY UA: 1.015 (ref 1.005–1.040)
SQUAMOUS EPITHELIAL: 1 /HPF (ref 0–5)
UROBILINOGEN UA: 0.2
WBC UA: 2 /HPF — ABNORMAL HIGH (ref <2)

## 2019-07-11 LAB — PROTEIN / CREATININE RATIO, URINE: PROTEIN URINE: 16.5 mg/dL

## 2019-07-11 LAB — MAGNESIUM: Magnesium:MCnc:Pt:Ser/Plas:Qn:: 1 — CL

## 2019-07-11 LAB — EGFR CKD-EPI NON-AA MALE: Lab: 40 — ABNORMAL LOW

## 2019-07-11 LAB — MEAN CORPUSCULAR HEMOGLOBIN CONC: Erythrocyte mean corpuscular hemoglobin concentration:MCnc:Pt:RBC:Qn:Automated count: 33.7

## 2019-07-11 LAB — PROTEIN URINE: Protein:MCnc:Pt:Urine:Qn:: 16.5

## 2019-07-11 LAB — PROTEIN UA: Protein:MCnc:Pt:Urine:Qn:Test strip: NEGATIVE

## 2019-07-11 LAB — TACROLIMUS BLOOD: Lab: 6.9

## 2019-07-11 LAB — MONOCYTES - REL (DIFF): Monocytes/100 leukocytes:NFr:Pt:Bld:Qn:Manual count: 8

## 2019-07-14 ENCOUNTER — Encounter: Admit: 2019-07-14 | Discharge: 2019-07-15 | Payer: MEDICARE

## 2019-07-14 DIAGNOSIS — Z79899 Other long term (current) drug therapy: Principal | ICD-10-CM

## 2019-07-14 LAB — URINALYSIS
BACTERIA: NONE SEEN /HPF
BILIRUBIN UA: NEGATIVE
LEUKOCYTE ESTERASE UA: NEGATIVE
NITRITE UA: NEGATIVE
PH UA: 5.5 (ref 5.0–9.0)
PROTEIN UA: NEGATIVE
SPECIFIC GRAVITY UA: 1.02 (ref 1.005–1.040)
SQUAMOUS EPITHELIAL: 1 /HPF (ref 0–5)
UROBILINOGEN UA: 0.2
WBC UA: 1 /HPF (ref <2)

## 2019-07-14 LAB — CBC W/ AUTO DIFF
BASOPHILS RELATIVE PERCENT: 0.7 %
EOSINOPHILS ABSOLUTE COUNT: 0 10*9/L (ref 0.0–0.7)
HEMATOCRIT: 27.2 % — ABNORMAL LOW (ref 38.0–50.0)
HEMOGLOBIN: 9.1 g/dL — ABNORMAL LOW (ref 13.5–17.5)
LYMPHOCYTES ABSOLUTE COUNT: 0.1 10*9/L — ABNORMAL LOW (ref 0.7–4.0)
LYMPHOCYTES RELATIVE PERCENT: 1.7 %
MEAN CORPUSCULAR HEMOGLOBIN CONC: 33.3 g/dL (ref 30.0–36.0)
MEAN CORPUSCULAR HEMOGLOBIN: 32.6 pg (ref 26.0–34.0)
MEAN CORPUSCULAR VOLUME: 97.7 fL — ABNORMAL HIGH (ref 81.0–95.0)
MEAN PLATELET VOLUME: 7.7 fL (ref 7.0–10.0)
MONOCYTES RELATIVE PERCENT: 10.2 %
NEUTROPHILS ABSOLUTE COUNT: 2.9 10*9/L (ref 1.7–7.7)
NEUTROPHILS RELATIVE PERCENT: 87 %
PLATELET COUNT: 317 10*9/L (ref 150–450)
RED BLOOD CELL COUNT: 2.78 10*12/L — ABNORMAL LOW (ref 4.32–5.72)
RED CELL DISTRIBUTION WIDTH: 15 % (ref 12.0–15.0)
WBC ADJUSTED: 3.3 10*9/L — ABNORMAL LOW (ref 3.5–10.5)

## 2019-07-14 LAB — RENAL FUNCTION PANEL
ANION GAP: 9 mmol/L (ref 7–15)
BLOOD UREA NITROGEN: 22 mg/dL — ABNORMAL HIGH (ref 7–21)
BUN / CREAT RATIO: 12
CALCIUM: 10.3 mg/dL — ABNORMAL HIGH (ref 8.5–10.2)
CHLORIDE: 115 mmol/L — ABNORMAL HIGH (ref 98–107)
CREATININE: 1.78 mg/dL — ABNORMAL HIGH (ref 0.70–1.30)
EGFR CKD-EPI AA MALE: 45 mL/min/{1.73_m2} — ABNORMAL LOW (ref >=60)
EGFR CKD-EPI NON-AA MALE: 39 mL/min/{1.73_m2} — ABNORMAL LOW (ref >=60)
GLUCOSE RANDOM: 124 mg/dL (ref 70–179)
PHOSPHORUS: 3.4 mg/dL (ref 2.9–4.7)
POTASSIUM: 5 mmol/L (ref 3.5–5.0)
SODIUM: 146 mmol/L — ABNORMAL HIGH (ref 135–145)

## 2019-07-14 LAB — EOSINOPHILS RELATIVE PERCENT: Eosinophils/100 leukocytes:NFr:Pt:Bld:Qn:Automated count: 0.4

## 2019-07-14 LAB — MAGNESIUM: Magnesium:MCnc:Pt:Ser/Plas:Qn:: 1 — CL

## 2019-07-14 LAB — BUN / CREAT RATIO: Urea nitrogen/Creatinine:MRto:Pt:Ser/Plas:Qn:: 12

## 2019-07-14 LAB — COLOR

## 2019-07-14 LAB — PROTEIN URINE: Protein:MCnc:Pt:Urine:Qn:: 18.9

## 2019-07-14 LAB — TACROLIMUS BLOOD: Lab: 7.3

## 2019-07-14 MED ORDER — MG-PLUS-PROTEIN 133 MG TABLET
ORAL_TABLET | Freq: Every day | ORAL | 11 refills | 0.00000 days | Status: CP
Start: 2019-07-14 — End: ?

## 2019-07-14 NOTE — Unmapped (Signed)
Magnesium level 1.0. Per Dr. Nestor Lewandowsky, increase Magnesium supplement to three times per day.

## 2019-07-15 DIAGNOSIS — Z940 Kidney transplant status: Principal | ICD-10-CM

## 2019-07-15 MED ORDER — MG-PLUS-PROTEIN 133 MG TABLET
ORAL_TABLET | 11 refills | 0 days | Status: CP
Start: 2019-07-15 — End: ?

## 2019-07-15 MED ORDER — ENVARSUS XR 4 MG TABLET,EXTENDED RELEASE
ORAL_TABLET | 11 refills | 0 days | Status: CP
Start: 2019-07-15 — End: ?

## 2019-07-15 NOTE — Unmapped (Signed)
Labs reviewed with L. Micemoyer. Will increse Envarsus to 12 mg every day.

## 2019-07-16 ENCOUNTER — Encounter: Admit: 2019-07-16 | Discharge: 2019-07-16 | Payer: MEDICARE

## 2019-07-16 ENCOUNTER — Encounter
Admit: 2019-07-16 | Discharge: 2019-07-16 | Payer: MEDICARE | Attending: Student in an Organized Health Care Education/Training Program | Primary: Student in an Organized Health Care Education/Training Program

## 2019-07-16 DIAGNOSIS — Z940 Kidney transplant status: Principal | ICD-10-CM

## 2019-07-16 LAB — SMEAR REVIEW

## 2019-07-16 LAB — CBC W/ AUTO DIFF
BASOPHILS ABSOLUTE COUNT: 0.1 10*9/L (ref 0.0–0.1)
BASOPHILS RELATIVE PERCENT: 1.9 %
EOSINOPHILS ABSOLUTE COUNT: 0 10*9/L (ref 0.0–0.4)
EOSINOPHILS RELATIVE PERCENT: 1 %
HEMATOCRIT: 32.3 % — ABNORMAL LOW (ref 41.0–53.0)
HEMOGLOBIN: 9.9 g/dL — ABNORMAL LOW (ref 13.5–17.5)
LARGE UNSTAINED CELLS: 2 % (ref 0–4)
LYMPHOCYTES RELATIVE PERCENT: 4.6 %
MEAN CORPUSCULAR HEMOGLOBIN CONC: 30.7 g/dL — ABNORMAL LOW (ref 31.0–37.0)
MEAN CORPUSCULAR VOLUME: 104 fL — ABNORMAL HIGH (ref 80.0–100.0)
MEAN PLATELET VOLUME: 9.8 fL (ref 7.0–10.0)
MONOCYTES ABSOLUTE COUNT: 0.3 10*9/L (ref 0.2–0.8)
MONOCYTES RELATIVE PERCENT: 9.1 %
NEUTROPHILS ABSOLUTE COUNT: 2.7 10*9/L (ref 2.0–7.5)
NEUTROPHILS RELATIVE PERCENT: 81.3 %
PLATELET COUNT: 351 10*9/L (ref 150–440)
RED BLOOD CELL COUNT: 3.11 10*12/L — ABNORMAL LOW (ref 4.50–5.90)
RED CELL DISTRIBUTION WIDTH: 15.4 % — ABNORMAL HIGH (ref 12.0–15.0)
WBC ADJUSTED: 3.3 10*9/L — ABNORMAL LOW (ref 4.5–11.0)

## 2019-07-16 LAB — RENAL FUNCTION PANEL
ALBUMIN: 4.5 g/dL (ref 3.5–5.0)
ANION GAP: 12 mmol/L (ref 7–15)
BUN / CREAT RATIO: 12
CALCIUM: 10.4 mg/dL — ABNORMAL HIGH (ref 8.5–10.2)
CHLORIDE: 110 mmol/L — ABNORMAL HIGH (ref 98–107)
CO2: 22 mmol/L (ref 22.0–30.0)
CREATININE: 1.74 mg/dL — ABNORMAL HIGH (ref 0.70–1.30)
EGFR CKD-EPI AA MALE: 46 mL/min/{1.73_m2} — ABNORMAL LOW (ref >=60)
EGFR CKD-EPI NON-AA MALE: 40 mL/min/{1.73_m2} — ABNORMAL LOW (ref >=60)
GLUCOSE RANDOM: 168 mg/dL — ABNORMAL HIGH (ref 70–99)
POTASSIUM: 5.3 mmol/L — ABNORMAL HIGH (ref 3.5–5.0)
SODIUM: 144 mmol/L (ref 135–145)

## 2019-07-16 LAB — CALCIUM: Calcium:MCnc:Pt:Ser/Plas:Qn:: 10.4 — ABNORMAL HIGH

## 2019-07-16 LAB — TACROLIMUS BLOOD: Lab: 7.4

## 2019-07-16 LAB — EOSINOPHILS RELATIVE PERCENT: Eosinophils/100 leukocytes:NFr:Pt:Bld:Qn:Automated count: 1

## 2019-07-16 LAB — MAGNESIUM: Magnesium:MCnc:Pt:Ser/Plas:Qn:: 1.1 — ABNORMAL LOW

## 2019-07-16 MED ORDER — OMEPRAZOLE 40 MG CAPSULE,DELAYED RELEASE
ORAL_CAPSULE | Freq: Every evening | ORAL | 11 refills | 30 days | Status: CP
Start: 2019-07-16 — End: ?

## 2019-07-16 MED ORDER — PRAVASTATIN 40 MG TABLET
ORAL_TABLET | Freq: Every evening | ORAL | 11 refills | 30 days | Status: CP
Start: 2019-07-16 — End: ?
  Filled 2019-08-04: qty 30, 30d supply, fill #0

## 2019-07-16 MED ORDER — ALLOPURINOL 100 MG TABLET
ORAL_TABLET | Freq: Every evening | ORAL | 11 refills | 30.00000 days | Status: CP
Start: 2019-07-16 — End: ?

## 2019-07-16 NOTE — Unmapped (Signed)
RFTS update: $10.04 copay    New Envarsus dose change  Last filled 06/26/19  Copay = $0

## 2019-07-16 NOTE — Unmapped (Signed)
[facility] Clinical Assessment & Refill Coordination Note    Frank Leach, [place]: 07-25-1951  Phone: [phone] (home)     All above HIPAA information was verified with patient.     Was a Nurse, learning disability used for this call? No    Specialty Medication(s):   Transplant: Envarsus 1mg , Envarsus 4mg ,  mycophenolic acid 180mg  and valgancyclovir 450mg      Current Outpatient Medications   Medication Sig Dispense Refill   ??? acetaminophen (TYLENOL) 500 MG tablet Take 1-2 tablets (500-1,000 mg total) by mouth every six (6) hours as needed for pain or fever (> 38C). 100 tablet 0   ??? allopurinol (ZYLOPRIM) 100 MG tablet Take 100 mg by mouth every evening.      ??? amLODIPine (NORVASC) 10 MG tablet Take 1 tablet (10 mg total) by mouth daily. 30 tablet 11   ??? aspirin (ECOTRIN) 81 MG tablet Take 1 tablet (81 mg total) by mouth daily. 30 tablet 11   ??? cinacalcet (SENSIPAR) 60 MG tablet Hold     ??? docusate sodium (COLACE) 100 MG capsule Take 1 capsule (100 mg total) by mouth two (2) times a day as needed for constipation. 60 each 0   ??? ENVARSUS XR 1 mg Tb24 extended release tablet Take 3 (1mg ) tablets with 2 (4mg ) tablets by mouth daily. Total dose = 11mg . 90 tablet 11   ??? ENVARSUS XR 4 mg Tb24 extended release tablet Take three 4 mg tablets for a total daily dose of 12 mg by mouth daily 90 tablet 11   ??? gabapentin (NEURONTIN) 100 MG capsule Take 1 capsule (100 mg total) by mouth nightly for 12 days. 12 capsule 0   ??? magnesium oxide-Mg AA chelate (MAGNESIUM, AMINO ACID CHELATE,) 133 mg Tab Take one tablet by mouth three times a day. 100 tablet 11   ??? mycophenolate (MYFORTIC) 180 MG EC tablet Take 3 tablets (540 mg total) by mouth Two (2) times a day. 180 tablet 11   ??? omeprazole (PRILOSEC) 40 MG capsule Take 40 mg by mouth nightly.      ??? polyethylene glycol (GLYCOLAX) 17 gram/dose powder Mix 17 g in 8 ounces of water or juice and drink by mouth daily as needed. 510 g 0 ??? pravastatin (PRAVACHOL) 40 MG tablet Take 40 mg by mouth nightly.      ??? sulfamethoxazole-trimethoprim (BACTRIM) 400-80 mg per tablet Take 1 tablet (80 mg of trimethoprim total) by mouth Every Monday, Wednesday, and Friday. 12 tablet 5   ??? traMADoL (ULTRAM) 50 mg tablet Take 1-2 tablets (50-100 mg total) by mouth two (2) times a day as needed for other (pain). 30 each 0   ??? valGANciclovir (VALCYTE) 450 mg tablet Take 1 tablet (450 mg total) by mouth every other day. 30 tablet 2     No current facility-administered medications for this visit.         Changes to medications: Briston reports no changes at this time.    No Known Allergies    Changes to allergies: No    SPECIALTY MEDICATION ADHERENCE     Envarsus Xr 1 mg: 10 days of medicine on hand   Envarsus Xr  4 mg: 10 days of medicine on hand   Mycophenolate 180 mg: 10 days of medicine on hand   Valganciclovir 450 mg: 7 days of medicine on hand       Medication Adherence    Patient reported X missed doses in the last month: 0  Specialty Medication: Valganciclovir 450mg   Patient is on additional specialty medications: Yes  Additional Specialty Medications: Envarsus Xr  Patient Reported Additional Medication X Missed Doses in the Last Month: 0  Patient is on more than two specialty medications: Yes  Specialty Medication: Mycophenolate   Patient Reported Additional Medication X Missed Doses in the Last Month: 0          Specialty medication(s) dose(s) confirmed: Regimen is correct and unchanged.     Are there any concerns with adherence? No    Adherence counseling provided? Not needed    CLINICAL MANAGEMENT AND INTERVENTION      Clinical Benefit Assessment:    Do you feel the medicine is effective or helping your condition? Yes    Clinical Benefit counseling provided? Not needed    Adverse Effects Assessment:    Are you experiencing any side effects? No    Are you experiencing difficulty administering your medicine? No    Quality of Life Assessment: How many days over the past month did your kidney transplant  keep you from your normal activities? For example, brushing your teeth or getting up in the morning. 0    Have you discussed this with your provider? Not needed    Therapy Appropriateness:    Is therapy appropriate? Yes, therapy is appropriate and should be continued    DISEASE/MEDICATION-SPECIFIC INFORMATION      N/A    PATIENT SPECIFIC NEEDS     ? Does the patient have any physical, cognitive, or cultural barriers? No    ? Is the patient high risk? Yes, patient is taking a REMS drug. Medication is dispensed in compliance with REMS program.     ? Does the patient require a Care Management Plan? No     ? Does the patient require physician intervention or other additional services (i.e. nutrition, smoking cessation, social work)? No      SHIPPING     Specialty Medication(s) to be Shipped:   Transplant: valgancyclovir 450mg   Patient declined fills on envarsus 1mg  and 4mg .  Declined mycophenolate today too.  Other medication(s) to be shipped: Amlodipine, Allopurinol     Changes to insurance: No    Delivery Scheduled: Yes, Expected medication delivery date: 07/22/2019.     Medication will be delivered via UPS to the confirmed prescription address in [facility].    The patient will receive a drug information handout for each medication shipped and additional FDA Medication Guides as required.  Verified that patient has previously received a Conservation officer, historic buildings.    All of the patient's questions and concerns have been addressed.    Tera Helper   [facility] Specialty Pharmacist

## 2019-07-16 NOTE — Unmapped (Signed)
TRANSPLANT SURGERY PROGRESS NOTE    Assessment and Plan  Good recovery from DDKT and good UO.  K on the high end - 5.3.  No need for further surgical appointments.   Follow-up with nephrology.  Repeat labs to follow the K.    Subjective  Frank Leach is a 68 y.o. male s/p DDKT on 11/15 with uneventfull post-op.  He's feeling well, good UO, eating and good BM, mobile.  No significant abdominal pain or other concerns.    Objective    Vitals:    07/16/19 0954   BP: 141/80   Pulse: 78   Temp: 36.8 ??C (98.2 ??F)   TempSrc: Tympanic   Weight: 79.6 kg (175 lb 8 oz)   Height: 167.6 cm (5' 6)      Body mass index is 28.33 kg/m??.     Physical Exam:  Looks good, no distress.  Abdomen: soft, non-tender, clean and dry incision.    Data Review:  All lab results last 24 hours:    Recent Results (from the past 24 hour(s))   Magnesium Level    Collection Time: 07/16/19  9:09 AM   Result Value Ref Range    Magnesium 1.1 (L) 1.6 - 2.2 mg/dL   Renal Function Panel    Collection Time: 07/16/19  9:09 AM   Result Value Ref Range    Sodium 144 135 - 145 mmol/L    Potassium 5.3 (H) 3.5 - 5.0 mmol/L    Chloride 110 (H) 98 - 107 mmol/L    CO2 22.0 22.0 - 30.0 mmol/L    Anion Gap 12 7 - 15 mmol/L    BUN 21 7 - 21 mg/dL    Creatinine 4.54 (H) 0.70 - 1.30 mg/dL    BUN/Creatinine Ratio 12     EGFR CKD-EPI Non-African American, Male 40 (L) >=60 mL/min/1.48m2    EGFR CKD-EPI African American, Male 46 (L) >=60 mL/min/1.23m2    Glucose 168 (H) 70 - 99 mg/dL    Calcium 09.8 (H) 8.5 - 10.2 mg/dL    Phosphorus 3.6 2.9 - 4.7 mg/dL    Albumin 4.5 3.5 - 5.0 g/dL   CBC w/ Differential    Collection Time: 07/16/19  9:09 AM   Result Value Ref Range    WBC 3.3 (L) 4.5 - 11.0 10*9/L    RBC 3.11 (L) 4.50 - 5.90 10*12/L    HGB 9.9 (L) 13.5 - 17.5 g/dL    HCT 11.9 (L) 14.7 - 53.0 %    MCV 104.0 (H) 80.0 - 100.0 fL    MCH 31.9 26.0 - 34.0 pg    MCHC 30.7 (L) 31.0 - 37.0 g/dL    RDW 82.9 (H) 56.2 - 15.0 %    MPV 9.8 7.0 - 10.0 fL Platelet 351 150 - 440 10*9/L    Variable HGB Concentration Slight (A) Not Present    Neutrophils % 81.3 %    Lymphocytes % 4.6 %    Monocytes % 9.1 %    Eosinophils % 1.0 %    Basophils % 1.9 %    Absolute Neutrophils 2.7 2.0 - 7.5 10*9/L    Absolute Lymphocytes 0.2 (L) 1.5 - 5.0 10*9/L    Absolute Monocytes 0.3 0.2 - 0.8 10*9/L    Absolute Eosinophils 0.0 0.0 - 0.4 10*9/L    Absolute Basophils 0.1 0.0 - 0.1 10*9/L    Large Unstained Cells 2 0 - 4 %    Macrocytosis Marked (A) Not Present  Hypochromasia Marked (A) Not Present

## 2019-07-18 ENCOUNTER — Encounter: Admit: 2019-07-18 | Discharge: 2019-07-19 | Payer: MEDICARE

## 2019-07-18 LAB — CBC W/ AUTO DIFF
BASOPHILS ABSOLUTE COUNT: 0.1 10*9/L (ref 0.0–0.1)
BASOPHILS RELATIVE PERCENT: 3 %
EOSINOPHILS ABSOLUTE COUNT: 0 10*9/L (ref 0.0–0.7)
EOSINOPHILS RELATIVE PERCENT: 1.2 %
HEMATOCRIT: 29.4 % — ABNORMAL LOW (ref 38.0–50.0)
HEMOGLOBIN: 9.7 g/dL — ABNORMAL LOW (ref 13.5–17.5)
LYMPHOCYTES ABSOLUTE COUNT: 0.1 10*9/L — ABNORMAL LOW (ref 0.7–4.0)
LYMPHOCYTES RELATIVE PERCENT: 2.1 %
MEAN CORPUSCULAR HEMOGLOBIN CONC: 33 g/dL (ref 30.0–36.0)
MEAN CORPUSCULAR HEMOGLOBIN: 32.3 pg (ref 26.0–34.0)
MEAN CORPUSCULAR VOLUME: 97.9 fL — ABNORMAL HIGH (ref 81.0–95.0)
MEAN PLATELET VOLUME: 7.7 fL (ref 7.0–10.0)
MONOCYTES RELATIVE PERCENT: 13.2 %
NEUTROPHILS ABSOLUTE COUNT: 2.8 10*9/L (ref 1.7–7.7)
PLATELET COUNT: 348 10*9/L (ref 150–450)
RED BLOOD CELL COUNT: 3 10*12/L — ABNORMAL LOW (ref 4.32–5.72)
RED CELL DISTRIBUTION WIDTH: 14.7 % (ref 12.0–15.0)
WBC ADJUSTED: 3.5 10*9/L (ref 3.5–10.5)

## 2019-07-18 LAB — RENAL FUNCTION PANEL
ANION GAP: 9 mmol/L (ref 7–15)
BLOOD UREA NITROGEN: 17 mg/dL (ref 7–21)
BUN / CREAT RATIO: 10
CALCIUM: 10.4 mg/dL — ABNORMAL HIGH (ref 8.5–10.2)
CHLORIDE: 115 mmol/L — ABNORMAL HIGH (ref 98–107)
CO2: 24 mmol/L (ref 22.0–30.0)
CREATININE: 1.73 mg/dL — ABNORMAL HIGH (ref 0.70–1.30)
EGFR CKD-EPI AA MALE: 47 mL/min/{1.73_m2} — ABNORMAL LOW (ref >=60)
EGFR CKD-EPI NON-AA MALE: 40 mL/min/{1.73_m2} — ABNORMAL LOW (ref >=60)
GLUCOSE RANDOM: 152 mg/dL — ABNORMAL HIGH (ref 70–99)
PHOSPHORUS: 3.1 mg/dL (ref 2.9–4.7)
POTASSIUM: 5.4 mmol/L — ABNORMAL HIGH (ref 3.5–5.0)
SODIUM: 148 mmol/L — ABNORMAL HIGH (ref 135–145)

## 2019-07-18 LAB — PLATELET COUNT: Platelets:NCnc:Pt:Bld:Qn:Automated count: 348

## 2019-07-18 LAB — ANION GAP: Anion gap 3:SCnc:Pt:Ser/Plas:Qn:: 9

## 2019-07-18 LAB — TACROLIMUS BLOOD: Lab: 10.2

## 2019-07-18 LAB — MAGNESIUM: Magnesium:MCnc:Pt:Ser/Plas:Qn:: 1.1 — ABNORMAL LOW

## 2019-07-21 ENCOUNTER — Encounter: Admit: 2019-07-21 | Discharge: 2019-07-22 | Payer: MEDICARE

## 2019-07-21 LAB — CBC W/ AUTO DIFF
BASOPHILS ABSOLUTE COUNT: 0.1 10*9/L (ref 0.0–0.1)
EOSINOPHILS ABSOLUTE COUNT: 0 10*9/L (ref 0.0–0.7)
EOSINOPHILS RELATIVE PERCENT: 1.2 %
HEMATOCRIT: 29.7 % — ABNORMAL LOW (ref 38.0–50.0)
HEMOGLOBIN: 9.8 g/dL — ABNORMAL LOW (ref 13.5–17.5)
LYMPHOCYTES ABSOLUTE COUNT: 0.1 10*9/L — ABNORMAL LOW (ref 0.7–4.0)
LYMPHOCYTES RELATIVE PERCENT: 2.6 %
MEAN CORPUSCULAR HEMOGLOBIN CONC: 33.1 g/dL (ref 30.0–36.0)
MEAN CORPUSCULAR HEMOGLOBIN: 32.4 pg (ref 26.0–34.0)
MEAN PLATELET VOLUME: 7.4 fL (ref 7.0–10.0)
MONOCYTES ABSOLUTE COUNT: 0.5 10*9/L (ref 0.1–1.0)
MONOCYTES RELATIVE PERCENT: 12.7 %
NEUTROPHILS ABSOLUTE COUNT: 3 10*9/L (ref 1.7–7.7)
NEUTROPHILS RELATIVE PERCENT: 81 %
PLATELET COUNT: 378 10*9/L (ref 150–450)
RED BLOOD CELL COUNT: 3.04 10*12/L — ABNORMAL LOW (ref 4.32–5.72)
RED CELL DISTRIBUTION WIDTH: 14.4 % (ref 12.0–15.0)

## 2019-07-21 LAB — MAGNESIUM
MAGNESIUM: 1 mg/dL — CL (ref 1.6–2.2)
Magnesium:MCnc:Pt:Ser/Plas:Qn:: 1 — CL

## 2019-07-21 LAB — RENAL FUNCTION PANEL
ALBUMIN: 4.3 g/dL (ref 3.5–5.0)
ANION GAP: 11 mmol/L (ref 7–15)
BLOOD UREA NITROGEN: 20 mg/dL (ref 7–21)
CALCIUM: 10.5 mg/dL — ABNORMAL HIGH (ref 8.5–10.2)
CHLORIDE: 115 mmol/L — ABNORMAL HIGH (ref 98–107)
CO2: 22 mmol/L (ref 22.0–30.0)
CREATININE: 1.64 mg/dL — ABNORMAL HIGH (ref 0.70–1.30)
EGFR CKD-EPI NON-AA MALE: 43 mL/min/{1.73_m2} — ABNORMAL LOW (ref >=60)
GLUCOSE RANDOM: 169 mg/dL (ref 70–179)
PHOSPHORUS: 3.3 mg/dL (ref 2.9–4.7)
POTASSIUM: 5.3 mmol/L — ABNORMAL HIGH (ref 3.5–5.0)
SODIUM: 148 mmol/L — ABNORMAL HIGH (ref 135–145)

## 2019-07-21 LAB — CREATININE: Creatinine:MCnc:Pt:Ser/Plas:Qn:: 1.64 — ABNORMAL HIGH

## 2019-07-21 LAB — HEMOGLOBIN: Hemoglobin:MCnc:Pt:Bld:Qn:: 9.8 — ABNORMAL LOW

## 2019-07-21 LAB — TACROLIMUS BLOOD: Lab: 8.1

## 2019-07-21 MED ORDER — MG-PLUS-PROTEIN 133 MG TABLET
ORAL_TABLET | 11 refills | 0 days | Status: CP
Start: 2019-07-21 — End: ?
  Filled 2019-07-24: qty 180, 30d supply, fill #0

## 2019-07-21 MED FILL — VALGANCICLOVIR 450 MG TABLET: 30 days supply | Qty: 15 | Fill #0 | Status: AC

## 2019-07-21 MED FILL — AMLODIPINE 10 MG TABLET: 30 days supply | Qty: 30 | Fill #0 | Status: AC

## 2019-07-21 MED FILL — AMLODIPINE 10 MG TABLET: ORAL | 30 days supply | Qty: 30 | Fill #0

## 2019-07-21 NOTE — Unmapped (Signed)
Received page from [facility] lab pt's Mag level is 1.0. Messaged to post kidney Sandford Craze and pharmacist, Roslynn Amble Inpatient Transplant Nurse Coordinator 07/21/2019 9:13 AM

## 2019-07-22 NOTE — Unmapped (Signed)
[facility] Refill Coordination Note    Specialty Medication(s) to be Shipped:   Transplant: Envarsus 4mg  and mycophenolate mofetil 180mg     Other medication(s) to be shipped: magnesium 133mg , pravastatin 40mg , and bactrim     Frank Leach, DOB: 09-Jun-1951  Phone: [phone] (home)       All above HIPAA information was verified with patient. and wife    Was a Nurse, learning disability used for this call? No    Completed refill call assessment today to schedule patient's medication shipment from the [facility] [phone]).       Specialty medication(s) and dose(s) confirmed: Patient reports changes to the regimen as follows: Stopping Envarsus 1mg     Changes to medications: Les reports no changes at this time.  Changes to insurance: No  Questions for the pharmacist: No    Confirmed patient received Welcome Packet with first shipment. The patient will receive a drug information handout for each medication shipped and additional FDA Medication Guides as required.       DISEASE/MEDICATION-SPECIFIC INFORMATION        N/A    SPECIALTY MEDICATION ADHERENCE     Medication Adherence    Patient reported X missed doses in the last month: 0  Specialty Medication: Envarsus 4mg   Patient is on additional specialty medications: Yes  Additional Specialty Medications: Mycophenolate 180mg   Patient Reported Additional Medication X Missed Doses in the Last Month: 0  Patient is on more than two specialty medications: No          Envarsus 4 mg: 4 days of medicine on hand   Mycophenolate 180 mg: 4 days of medicine on hand     SHIPPING     Shipping address confirmed in Epic.     Delivery Scheduled: Yes, Expected medication delivery date: 07/25/2019.     Medication will be delivered via UPS to the prescription address in Epic [facility].    Lorelei Pont [facility] Specialty Technician

## 2019-07-23 ENCOUNTER — Ambulatory Visit: Admit: 2019-07-23 | Discharge: 2019-07-24 | Payer: MEDICARE

## 2019-07-23 LAB — RENAL FUNCTION PANEL
ALBUMIN: 4.4 g/dL (ref 3.5–5.0)
ANION GAP: 9 mmol/L (ref 7–15)
BLOOD UREA NITROGEN: 18 mg/dL (ref 7–21)
BUN / CREAT RATIO: 11
CALCIUM: 10.7 mg/dL — ABNORMAL HIGH (ref 8.5–10.2)
CHLORIDE: 113 mmol/L — ABNORMAL HIGH (ref 98–107)
CO2: 25 mmol/L (ref 22.0–30.0)
CREATININE: 1.67 mg/dL — ABNORMAL HIGH (ref 0.70–1.30)
EGFR CKD-EPI AA MALE: 49 mL/min/{1.73_m2} — ABNORMAL LOW (ref >=60)
EGFR CKD-EPI NON-AA MALE: 42 mL/min/{1.73_m2} — ABNORMAL LOW (ref >=60)
GLUCOSE RANDOM: 155 mg/dL (ref 70–179)
PHOSPHORUS: 3.3 mg/dL (ref 2.9–4.7)
SODIUM: 147 mmol/L — ABNORMAL HIGH (ref 135–145)

## 2019-07-23 LAB — MEAN CORPUSCULAR HEMOGLOBIN: Erythrocyte mean corpuscular hemoglobin:EntMass:Pt:RBC:Qn:Automated count: 32.7

## 2019-07-23 LAB — TACROLIMUS BLOOD: Lab: 6.3

## 2019-07-23 LAB — CBC W/ AUTO DIFF
BASOPHILS ABSOLUTE COUNT: 0 10*9/L (ref 0.0–0.1)
BASOPHILS RELATIVE PERCENT: 1 %
EOSINOPHILS ABSOLUTE COUNT: 0 10*9/L (ref 0.0–0.7)
EOSINOPHILS RELATIVE PERCENT: 1.2 %
HEMATOCRIT: 30.4 % — ABNORMAL LOW (ref 38.0–50.0)
LYMPHOCYTES ABSOLUTE COUNT: 0.1 10*9/L — ABNORMAL LOW (ref 0.7–4.0)
MEAN CORPUSCULAR HEMOGLOBIN: 32.7 pg (ref 26.0–34.0)
MEAN CORPUSCULAR VOLUME: 97.2 fL — ABNORMAL HIGH (ref 81.0–95.0)
MEAN PLATELET VOLUME: 8.2 fL (ref 7.0–10.0)
MONOCYTES ABSOLUTE COUNT: 0.5 10*9/L (ref 0.1–1.0)
MONOCYTES RELATIVE PERCENT: 12.6 %
NEUTROPHILS ABSOLUTE COUNT: 3.3 10*9/L (ref 1.7–7.7)
NEUTROPHILS RELATIVE PERCENT: 82.7 %
PLATELET COUNT: 381 10*9/L (ref 150–450)
RED BLOOD CELL COUNT: 3.12 10*12/L — ABNORMAL LOW (ref 4.32–5.72)
RED CELL DISTRIBUTION WIDTH: 14.4 % (ref 12.0–15.0)
WBC ADJUSTED: 4 10*9/L (ref 3.5–10.5)

## 2019-07-23 LAB — POTASSIUM: Potassium:SCnc:Pt:Ser/Plas:Qn:: 5.2 — ABNORMAL HIGH

## 2019-07-23 LAB — MAGNESIUM: Magnesium:MCnc:Pt:Ser/Plas:Qn:: 1.1 — ABNORMAL LOW

## 2019-07-23 NOTE — Unmapped (Signed)
After reviewing recent lab values and Magnesium with both Wallace Cullens Mincemoyer, CPP  and  Leeroy Bock, MD no further adjustments to Magnesium at this time. Patient just increased dose on Monday. Will continue to follow M/W/F labs.

## 2019-07-24 MED FILL — ENVARSUS XR 4 MG TABLET,EXTENDED RELEASE: 30 days supply | Qty: 90 | Fill #0

## 2019-07-24 MED FILL — ENVARSUS XR 4 MG TABLET,EXTENDED RELEASE: 30 days supply | Qty: 90 | Fill #0 | Status: AC

## 2019-07-24 MED FILL — MYCOPHENOLATE SODIUM 180 MG TABLET,DELAYED RELEASE: ORAL | 30 days supply | Qty: 180 | Fill #0

## 2019-07-24 MED FILL — MYCOPHENOLATE SODIUM 180 MG TABLET,DELAYED RELEASE: 30 days supply | Qty: 180 | Fill #0 | Status: AC

## 2019-07-24 MED FILL — SULFAMETHOXAZOLE 400 MG-TRIMETHOPRIM 80 MG TABLET: 28 days supply | Qty: 12 | Fill #0 | Status: AC

## 2019-07-24 MED FILL — MG-PLUS-PROTEIN 133 MG TABLET: 30 days supply | Qty: 180 | Fill #0 | Status: AC

## 2019-07-24 NOTE — Unmapped (Signed)
After reviewing recent tacrolimus levels with Wallace Cullens Mincemoyer, CPP from 12-16.   No changes at this time and continue monitor labs.   ??    Drue Flirt, RN Transplant Nurse Coordinator 07/24/2019 11:50 AM

## 2019-07-25 ENCOUNTER — Encounter: Admit: 2019-07-25 | Discharge: 2019-07-26 | Payer: MEDICARE

## 2019-07-25 LAB — RENAL FUNCTION PANEL
ALBUMIN: 4.4 g/dL (ref 3.5–5.0)
ANION GAP: 11 mmol/L (ref 7–15)
BLOOD UREA NITROGEN: 24 mg/dL — ABNORMAL HIGH (ref 7–21)
BUN / CREAT RATIO: 13
CALCIUM: 11 mg/dL — ABNORMAL HIGH (ref 8.5–10.2)
CHLORIDE: 112 mmol/L — ABNORMAL HIGH (ref 98–107)
CO2: 24 mmol/L (ref 22.0–30.0)
CREATININE: 1.92 mg/dL — ABNORMAL HIGH (ref 0.70–1.30)
EGFR CKD-EPI AA MALE: 41 mL/min/{1.73_m2} — ABNORMAL LOW (ref >=60)
EGFR CKD-EPI NON-AA MALE: 35 mL/min/{1.73_m2} — ABNORMAL LOW (ref >=60)
GLUCOSE RANDOM: 156 mg/dL (ref 70–179)
POTASSIUM: 5.3 mmol/L — ABNORMAL HIGH (ref 3.5–5.0)
SODIUM: 147 mmol/L — ABNORMAL HIGH (ref 135–145)

## 2019-07-25 LAB — CBC W/ AUTO DIFF
BASOPHILS ABSOLUTE COUNT: 0 10*9/L (ref 0.0–0.1)
BASOPHILS RELATIVE PERCENT: 0.8 %
EOSINOPHILS ABSOLUTE COUNT: 0 10*9/L (ref 0.0–0.7)
EOSINOPHILS RELATIVE PERCENT: 1 %
HEMOGLOBIN: 10.5 g/dL — ABNORMAL LOW (ref 13.5–17.5)
LYMPHOCYTES ABSOLUTE COUNT: 0.1 10*9/L — ABNORMAL LOW (ref 0.7–4.0)
LYMPHOCYTES RELATIVE PERCENT: 2 %
MEAN CORPUSCULAR HEMOGLOBIN CONC: 33.4 g/dL (ref 30.0–36.0)
MEAN CORPUSCULAR HEMOGLOBIN: 32.3 pg (ref 26.0–34.0)
MEAN CORPUSCULAR VOLUME: 96.6 fL — ABNORMAL HIGH (ref 81.0–95.0)
MONOCYTES RELATIVE PERCENT: 10.7 %
NEUTROPHILS ABSOLUTE COUNT: 3.9 10*9/L (ref 1.7–7.7)
NEUTROPHILS RELATIVE PERCENT: 85.5 %
PLATELET COUNT: 368 10*9/L (ref 150–450)
RED BLOOD CELL COUNT: 3.25 10*12/L — ABNORMAL LOW (ref 4.32–5.72)
RED CELL DISTRIBUTION WIDTH: 14.4 % (ref 12.0–15.0)
WBC ADJUSTED: 4.5 10*9/L (ref 3.5–10.5)

## 2019-07-25 LAB — WBC ADJUSTED: Leukocytes:NCnc:Pt:Bld:Qn:: 4.5

## 2019-07-25 LAB — EGFR CKD-EPI AA MALE: Lab: 41 — ABNORMAL LOW

## 2019-07-25 LAB — TACROLIMUS BLOOD: Lab: 7

## 2019-07-25 LAB — MAGNESIUM: Magnesium:MCnc:Pt:Ser/Plas:Qn:: 1.2 — ABNORMAL LOW

## 2019-07-25 NOTE — Unmapped (Signed)
Called patient he reports drinking approx 64 oz. This covering coordinator encouraged him to drink more than, to protect his kidney due to recent bump in Cr. Reviewed recent labs and12-18 labs with Pharm D Mincemoyer, tacrolimus pending.   No changes thus far.     Patient denies any s/s d/n/v.     confirmed his upcoming appt 12/22 at [facility] and confirmed he can get labs on Tuesday at [facility] and not go on Monday.     Drue Flirt, RN Transplant Nurse Coordinator 07/25/2019 11:16 AM

## 2019-07-28 NOTE — Unmapped (Signed)
Transplant Nephrology Clinic Visit      History of Present Illness    68 y.o. male here for follow up after kidney transplantation.  Patient has ESRD secondary to HTN.  Patient has been on dialysis since 01/25/2012.  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     Patient was admitted for kidney transplant on 06/21/2019.  Patient experienced hyperkalemia that despite medical management would not decrease.  2 hours of HD performed.  Patient discharged 06/26/2019.       Transplant History:    Organ Received: Left DDKT, DBD, PHS, KDPI: 42%; 21h cold ischemia  Native Kidney Disease: HTN; cPRA: 0%  Date of Transplant: 06/22/2019  Post-Transplant Course: HD once for high potassium  Prior Transplants: none  Induction: [place]  Date of Ureteral Stent Removal: 07/29/2019  CMV/EBV Status: CMV D+/R+  EBV D+/R+  Rejection Episodes: none  Donor Specific Antibodies: none  Results of Renal Imaging (pre and post):   Pre-Txp 08/29/2018 CT RMP  Sequela of bilateral nephrectomy. Interval decrease in linear soft tissue within the right nephrectomy bed, potentially postsurgical or fat necrosis. Unchanged linear tissue within the left nephrectomy bed. No suspicious enhancing lesions are visualized within the surgical beds    Post-Txp 06/25/2019 (txp kidney only)  The renal transplant was located in the left lower quadrant. Normal size and echogenicity.  No solid masses or calculi. Trace perinephric fluid adjacent to the lower pole of the kidney. Mild pelviectasis  - Perfusion: Using power Doppler, normal perfusion was seen throughout the renal parenchyma.  - Resistive indices in the renal transplant are stable compared with prior examination.  - Main renal artery/iliac artery: Patent. Again noted are 3 renal arteries. Resistive indices within the renal arteries are stable to minimally increased, now at or just above normal limits.  - Main renal vein/iliac vein: Patent      Current Immunosuppression Regimen: Envarsus 12 daily  Myfortic 540 mg BID      Subjective/Interval:   Since discharge patient has been seen by Transplant Surgery and had his JP drain removed on 07/02/2019.  He presents today looking well.  His home BP have been running 140-160's systolic.  He denies headaches, dizziness, lightheadedness, chest pain, shortness of breath.  He is having mild hand tremors.  His blood sugars at home have been 119-200's.  He states he is drinking about six 16.9 oz bottles of water. He denies fever, chills, abdominal pain, n/v/d, edema, gout flares, dysuria, hematuria.     He is scheduled to get his ureteral stent out today.     Last dose of Prograf: 9:00am    Past Medical History  1. HTN  2. DM II  3. RCC s/p Bilateral nephrectomy (L 04/2016, R 12/2016)  4. GERD  5. HLD  6. 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    Medications  Current Outpatient Medications   Medication Sig Dispense Refill   ??? acetaminophen (TYLENOL) 500 MG tablet Take 1-2 tablets (500-1,000 mg total) by mouth every six (6) hours as needed for pain or fever (> 38C). 100 tablet 0   ??? allopurinoL (ZYLOPRIM) 100 MG tablet Take 1 tablet (100 mg total) by mouth every evening. 30 tablet 11   ??? amLODIPine (NORVASC) 10 MG tablet Take 1 tablet (10 mg total) by mouth daily. 30 tablet 11   ???  aspirin (ECOTRIN) 81 MG tablet Take 1 tablet (81 mg total) by mouth daily. 30 tablet 11   ??? cinacalcet (SENSIPAR) 60 MG tablet Hold     ??? docusate sodium (COLACE) 100 MG capsule Take 1 capsule (100 mg total) by mouth two (2) times a day as needed for constipation. 60 each 0   ??? ENVARSUS XR 1 mg Tb24 extended release tablet Take 3 (1mg ) tablets with 2 (4mg ) tablets by mouth daily. Total dose = 11mg . 90 tablet 11   ??? ENVARSUS XR 4 mg Tb24 extended release tablet Take three 4 mg tablets for a total daily dose of 12 mg by mouth daily 90 tablet 11 ??? magnesium oxide-Mg AA chelate (MAGNESIUM, AMINO ACID CHELATE,) 133 mg Tab Take 2 tablets (266 mg) by mouth Three (3) times a day. 180 tablet 11   ??? mycophenolate (MYFORTIC) 180 MG EC tablet Take 3 tablets (540 mg total) by mouth Two (2) times a day. 180 tablet 11   ??? omeprazole (PRILOSEC) 40 MG capsule Take 1 capsule (40 mg total) by mouth nightly. 30 capsule 11   ??? pravastatin (PRAVACHOL) 40 MG tablet Take 1 tablet (40 mg total) by mouth nightly. 30 tablet 11   ??? sulfamethoxazole-trimethoprim (BACTRIM) 400-80 mg per tablet Take 1 tablet (80 mg of trimethoprim total) by mouth Every Monday, Wednesday, and Friday. 12 tablet 5   ??? traMADoL (ULTRAM) 50 mg tablet Take 1-2 tablets (50-100 mg total) by mouth two (2) times a day as needed for other (pain). 30 each 0   ??? valGANciclovir (VALCYTE) 450 mg tablet Take 1 tablet (450 mg total) by mouth every other day. 30 tablet 2     No current facility-administered medications for this visit.            Physical Exam  BP 151/75  - Pulse 75  - Temp 35.6 ??C (96.1 ??F) (Tympanic)  - Ht 170.2 cm (5' 7)  - Wt 81.1 kg (178 lb 11.2 oz)  - BMI 27.99 kg/m??   General: no acute distress  HEENT: wearing mask, PERRL  Neck: neck supple, no cervical lymphadenopathy appreciated  CV: normal rate, normal rhythm, no murmur, no gallops, no rubs appreciated  Lungs: clear to auscultation bilaterally  Abdomen: soft, non tender, incision with staples, c/d/i; no erythema or edema noted  Extremities:  no edema,   Musculoskeletal: no visible deformity, normal range of motion.  Pulses: intact distally throughout  Neurologic: awake, alert, and oriented x3    Staples removed by Transplant Nurse Coordinator.     Laboratory Data and Imaging reviewed in EPIC      Assessment:  68 y.o. 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         Recommendations/Plan: Allograft Function: Renal function stable at 1.70 with baseline of approximately 1.6-1.8 since transplant.  UPC 0.157.  DSAs pending.    Immunosuppression Management [High Risk Medical Decision Making For Drug Therapy Requiring Intensive Monitoring For Toxicity]: Tacrolimus trough level 8.5 today. Envarsus 12mg .  Targeting tacrolimus trough levels of approximately 8-10 ng/mL.   Continue mycophenolate 540 mg BID.     Blood Pressure Management: BP 151/75 at this visit. Start on Coreg 6.25 mg BID.      Lipid Management: Last lipid panel on 08/20/2018.  Patient is currently on Pravastatin.      Electrolytes: Electrolyte and metabolic parameters in acceptable range.  Will continue to  monitor.  Magnesium 1.3 today.  Continue taking supplements     Infectious Prophylaxis and Monitoring: CMV VL not drawn.  Urine decoys pending.   BK VL not drawn.  The patient continues on Valcyte (end 09/22/19) and Bactrim (end 12/20/19) prophylaxis.  Valcyte increased to daily.    Gout: No flares recently.  Continue on Allopurinol.     RCC s/p bilateral nephrectomy: CT RMP yearly.  Last CT RMP 08/29/2018 showed no suspicious enhancing lesions.  Next one due 08/2019.     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  Q-Gold negative 08/20/2018    Increased Blood Sugar: Start on Meformin 500mg  BID.    Health Maintenance:   Colonoscopy: 04/30/2018 - 1 adenomatous polyp; repeat 5-10 years    Immunizations:   Flu Shot: 04/30/2019  Prevnar 13: 10/30/2013  Pneumovax: 04/24/2016, due 2022  No immunizations till 1 year post transplant.    Counseling:  I counseled the patient on:  The need to avoid sun exposure and the use of sunblock while outdoors given the relatively higher risk of skin malignancy in an immunosuppressed state.  The need for adherence to immunosuppression medication.  Patient verbalized understanding.     Follow-Up:  Return to clinic in 4 weeks   Labs: twice a week Patient will continue to follow-up with his primary care provider for non-transplant related issues and medication refills. We have ordered transplant specific labs per the center's guidelines to monitor and assess for toxicities from immunosuppressant drug therapy        Jacquline Terrill L. Marina Goodell, MSN, APRN, AGNP-C - Kidney Transplant Nurse Practitioner  [facility] for Transplant Care

## 2019-07-29 ENCOUNTER — Encounter: Admit: 2019-07-29 | Discharge: 2019-07-29 | Payer: MEDICARE

## 2019-07-29 ENCOUNTER — Ambulatory Visit: Admit: 2019-07-29 | Discharge: 2019-07-29 | Payer: MEDICARE

## 2019-07-29 DIAGNOSIS — Z940 Kidney transplant status: Principal | ICD-10-CM

## 2019-07-29 DIAGNOSIS — Z79899 Other long term (current) drug therapy: Principal | ICD-10-CM

## 2019-07-29 DIAGNOSIS — R7309 Other abnormal glucose: Principal | ICD-10-CM

## 2019-07-29 DIAGNOSIS — I10 Essential (primary) hypertension: Principal | ICD-10-CM

## 2019-07-29 LAB — URINALYSIS
BILIRUBIN UA: NEGATIVE
GLUCOSE UA: NEGATIVE
KETONES UA: NEGATIVE
LEUKOCYTE ESTERASE UA: NEGATIVE
NITRITE UA: NEGATIVE
PH UA: 5 (ref 5.0–9.0)
PROTEIN UA: NEGATIVE
RBC UA: 6 /HPF — ABNORMAL HIGH (ref <=3)
SPECIFIC GRAVITY UA: 1.013 (ref 1.003–1.030)
SQUAMOUS EPITHELIAL: <1 /HPF (ref 0–5)
UROBILINOGEN UA: 0.2
WBC UA: 2 /HPF (ref <=2)

## 2019-07-29 LAB — CBC W/ AUTO DIFF
BASOPHILS ABSOLUTE COUNT: 0.1 10*9/L (ref 0.0–0.1)
BASOPHILS RELATIVE PERCENT: 1.4 %
EOSINOPHILS ABSOLUTE COUNT: 0.1 10*9/L (ref 0.0–0.4)
EOSINOPHILS RELATIVE PERCENT: 1.9 %
LARGE UNSTAINED CELLS: 1 % (ref 0–4)
LYMPHOCYTES ABSOLUTE COUNT: 0.2 10*9/L — ABNORMAL LOW (ref 1.5–5.0)
LYMPHOCYTES RELATIVE PERCENT: 4.7 %
MEAN CORPUSCULAR HEMOGLOBIN CONC: 29.5 g/dL — ABNORMAL LOW (ref 31.0–37.0)
MEAN CORPUSCULAR HEMOGLOBIN: 30.5 pg (ref 26.0–34.0)
MEAN CORPUSCULAR VOLUME: 103.6 fL — ABNORMAL HIGH (ref 80.0–100.0)
MEAN PLATELET VOLUME: 8.9 fL (ref 7.0–10.0)
MONOCYTES ABSOLUTE COUNT: 0.4 10*9/L (ref 0.2–0.8)
MONOCYTES RELATIVE PERCENT: 7.5 %
NEUTROPHILS ABSOLUTE COUNT: 3.9 10*9/L (ref 2.0–7.5)
NEUTROPHILS RELATIVE PERCENT: 83.3 %
PLATELET COUNT: 346 10*9/L (ref 150–440)
RED CELL DISTRIBUTION WIDTH: 15.2 % — ABNORMAL HIGH (ref 12.0–15.0)
WBC ADJUSTED: 4.7 10*9/L (ref 4.5–11.0)

## 2019-07-29 LAB — COMPREHENSIVE METABOLIC PANEL
ALBUMIN: 4.7 g/dL (ref 3.5–5.0)
ALKALINE PHOSPHATASE: 150 U/L — ABNORMAL HIGH (ref 38–126)
ALT (SGPT): 22 U/L (ref <50)
AST (SGOT): 24 U/L (ref 19–55)
BILIRUBIN TOTAL: 0.4 mg/dL (ref 0.0–1.2)
BLOOD UREA NITROGEN: 21 mg/dL (ref 7–21)
BUN / CREAT RATIO: 12
CALCIUM: 10.8 mg/dL — ABNORMAL HIGH (ref 8.5–10.2)
CHLORIDE: 106 mmol/L (ref 98–107)
CO2: 20 mmol/L — ABNORMAL LOW (ref 22.0–30.0)
CREATININE: 1.7 mg/dL — ABNORMAL HIGH (ref 0.70–1.30)
EGFR CKD-EPI AA MALE: 48 mL/min/{1.73_m2} — ABNORMAL LOW (ref >=60)
EGFR CKD-EPI NON-AA MALE: 41 mL/min/{1.73_m2} — ABNORMAL LOW (ref >=60)
GLUCOSE RANDOM: 166 mg/dL — ABNORMAL HIGH (ref 70–99)
POTASSIUM: 5.4 mmol/L — ABNORMAL HIGH (ref 3.5–5.0)
PROTEIN TOTAL: 7.9 g/dL (ref 6.5–8.3)
SODIUM: 140 mmol/L (ref 135–145)

## 2019-07-29 LAB — PHOSPHORUS: Phosphate:MCnc:Pt:Ser/Plas:Qn:: 3.3

## 2019-07-29 LAB — PROTEIN/CREAT RATIO, URINE: Protein/Creatinine:MRto:Pt:Urine:Qn:: 0.157

## 2019-07-29 LAB — GLUCOSE RANDOM: Glucose:MCnc:Pt:Ser/Plas:Qn:: 166 — ABNORMAL HIGH

## 2019-07-29 LAB — RBC UA: Erythrocytes:Naric:Pt:Urine sed:Qn:Microscopy.light.HPF: 6 — ABNORMAL HIGH

## 2019-07-29 LAB — HOWELL-JOLLY BODIES

## 2019-07-29 LAB — MAGNESIUM: Magnesium:MCnc:Pt:Ser/Plas:Qn:: 1.3 — ABNORMAL LOW

## 2019-07-29 LAB — TACROLIMUS BLOOD: Lab: 8.5

## 2019-07-29 LAB — MEAN CORPUSCULAR VOLUME: Erythrocyte mean corpuscular volume:EntVol:Pt:RBC:Qn:Automated count: 103.6 — ABNORMAL HIGH

## 2019-07-29 LAB — SLIDE REVIEW

## 2019-07-29 MED ORDER — CARVEDILOL 6.25 MG TABLET
ORAL_TABLET | Freq: Two times a day (BID) | ORAL | 11 refills | 30.00000 days | Status: CP
Start: 2019-07-29 — End: 2020-07-28
  Filled 2019-07-29: qty 60, 30d supply, fill #0

## 2019-07-29 MED ORDER — METFORMIN ER 500 MG TABLET,EXTENDED RELEASE 24 HR
ORAL_TABLET | 3 refills | 0 days | Status: CP
Start: 2019-07-29 — End: ?
  Filled 2019-07-29: qty 60, 30d supply, fill #0

## 2019-07-29 MED ORDER — VALGANCICLOVIR 450 MG TABLET
ORAL_TABLET | Freq: Every day | ORAL | 1 refills | 30.00000 days | Status: CP
Start: 2019-07-29 — End: 2019-10-27
  Filled 2019-08-19: qty 30, 30d supply, fill #0

## 2019-07-29 MED ADMIN — lidocaine 2% gel (XYLOCAINE) jelly urojet 20 mL: 20 mL | URETHRAL | @ 19:00:00 | Stop: 2019-07-29

## 2019-07-29 MED ADMIN — sodium chloride irrigation (NS) 0.9 % irrigation solution 500 mL: 500 mL | @ 19:00:00 | Stop: 2019-07-29

## 2019-07-29 MED FILL — METFORMIN ER 500 MG TABLET,EXTENDED RELEASE 24 HR: 30 days supply | Qty: 60 | Fill #0 | Status: AC

## 2019-07-29 MED FILL — CARVEDILOL 6.25 MG TABLET: 30 days supply | Qty: 60 | Fill #0 | Status: AC

## 2019-07-29 NOTE — Unmapped (Signed)
Frank Leach [id number]

## 2019-07-29 NOTE — Unmapped (Signed)
[facility] CLINIC PHARMACY NOTE  07/29/2019   Frank Leach  [id number]    Medication changes today:   1. Start metformin XR 500 mg daily x7 days then, if no diarrhea, increase to 2 tablets daily  2. Start carvedilol 6.25 mg BID  3. Increase Valcyte to 450 mg daily      Education/Adherence tools provided today:  1.provided updated medication list  2. provided additional education on immunosuppression and transplant related medications including reviewing indications of medications, dosing and side effects  3.  Provided additional education on metformin and carvedilol    Follow up items:  1. goal of understanding indications and dosing of immunosuppression medications  2. BG and if need to increase metformin  3. Consider changing pravastatin to high intensity statin    Next visit with pharmacy in 1-2 weeks  ____________________________________________________________________    Frank Leach is a 68 y.o. male s/p deceased kidney transplant on 2019-07-18 (Kidney) 2/2 HTN and T2DM.     Other PMH significant for diabetes (diet controlled pre transplant), gout, HTN    Seen by pharmacy today for: medication management and pill box fill and adherence education; last seen by pharmacy 4 weeks ago     CC:  Patient has no complaints today     Vitals:    07/29/19 1001   BP: 151/75   Pulse: 75   Temp: 35.6 ??C (96.1 ??F)       No Known Allergies    All medications reviewed and updated.     Medication list includes revisions made during today???s encounter    Outpatient Encounter Medications as of 07/29/2019   Medication Sig Dispense Refill   ??? acetaminophen (TYLENOL) 500 MG tablet Take 1-2 tablets (500-1,000 mg total) by mouth every six (6) hours as needed for pain or fever (> 38C). 100 tablet 0   ??? allopurinoL (ZYLOPRIM) 100 MG tablet Take 1 tablet (100 mg total) by mouth every evening. 30 tablet 11   ??? amLODIPine (NORVASC) 10 MG tablet Take 1 tablet (10 mg total) by mouth daily. 30 tablet 11 ??? aspirin (ECOTRIN) 81 MG tablet Take 1 tablet (81 mg total) by mouth daily. 30 tablet 11   ??? cinacalcet (SENSIPAR) 60 MG tablet Hold     ??? docusate sodium (COLACE) 100 MG capsule Take 1 capsule (100 mg total) by mouth two (2) times a day as needed for constipation. 60 each 0   ??? ENVARSUS XR 1 mg Tb24 extended release tablet Take 3 (1mg ) tablets with 2 (4mg ) tablets by mouth daily. Total dose = 11mg . 90 tablet 11   ??? ENVARSUS XR 4 mg Tb24 extended release tablet Take three 4 mg tablets for a total daily dose of 12 mg by mouth daily 90 tablet 11   ??? magnesium oxide-Mg AA chelate (MAGNESIUM, AMINO ACID CHELATE,) 133 mg Tab Take 2 tablets (266 mg) by mouth Three (3) times a day. 180 tablet 11   ??? mycophenolate (MYFORTIC) 180 MG EC tablet Take 3 tablets (540 mg total) by mouth Two (2) times a day. 180 tablet 11   ??? omeprazole (PRILOSEC) 40 MG capsule Take 1 capsule (40 mg total) by mouth nightly. 30 capsule 11   ??? [EXPIRED] polyethylene glycol (GLYCOLAX) 17 gram/dose powder Mix 17 g in 8 ounces of water or juice and drink by mouth daily as needed. 510 g 0   ??? pravastatin (PRAVACHOL) 40 MG tablet Take 1 tablet (40 mg total) by mouth nightly. 30 tablet  11   ??? sulfamethoxazole-trimethoprim (BACTRIM) 400-80 mg per tablet Take 1 tablet (80 mg of trimethoprim total) by mouth Every Monday, Wednesday, and Friday. 12 tablet 5   ??? traMADoL (ULTRAM) 50 mg tablet Take 1-2 tablets (50-100 mg total) by mouth two (2) times a day as needed for other (pain). 30 each 0   ??? valGANciclovir (VALCYTE) 450 mg tablet Take 1 tablet (450 mg total) by mouth every other day. 30 tablet 2     No facility-administered encounter medications on file as of 07/29/2019.        Induction agent : alemtuzumab    CURRENT IMMUNOSUPPRESSION: Envarsus 12 mg PO qd   prograf/Envarsus/cyclosporine goal: 8-10   myfortic540  mg PO bid    steroid free     Patient is tolerating immunosuppression well    IMMUNOSUPPRESSION DRUG LEVELS:  Lab Results Component Value Date    Tacrolimus, Trough 2.1 (L) 06/26/2019    Tacrolimus, Trough 3.0 (L) 06/25/2019    Tacrolimus, Trough 6.5 06/24/2019    Tacrolimus, Timed 7.0 07/25/2019    Tacrolimus, Timed 6.3 07/23/2019    Tacrolimus, Timed 8.1 07/21/2019     No results found for: CYCLO  No results found for: EVEROLIMUS  No results found for: SIROLIMUS    Envarsus level is accurate 24 hour trough    Graft function: stable  DSA: ntd  Biopsies to date: mild to moderate arteriosclerosis  UPC: 0.157 (12/22)  WBC/ANC:  wnl    Plan: Will maintain current immunosuppression. Continue to monitor.    OI Prophylaxis:   CMV Status: D+/ R+, moderate risk . CMV prophylaxis: valganciclovir 450 mg every other day x 3 months per protocol.  Estimated Creatinine Clearance: 43.6 mL/min (A) (based on SCr of 1.7 mg/dL (H)).  No results found for: CMVCP  PCP Prophylaxis: bactrim SS 1 tab MWF x 6 months.  Thrush: completed in hospital    Plan: Increase Valcyte to 450 mg daily. Continue to monitor.    CV Prophylaxis: asa 81 mg   The 10-year ASCVD risk score Denman George DC Jr., et al., 2013) is: 42.6%  Statin therapy: Indicated; currently on pravastatin 20 mg  Plan: Consider changing to high intensity statin at a later date. Continue to monitor     BP: Goal < 140/90. Clinic vitals reported above  Home BP ranges: 140-150/80  Current meds include: amlodipine 10 mg daily  Plan: Start carvedilol 6.25 mg BID. Continue to monitor    Anemia of CKD:  H/H:   Lab Results   Component Value Date    HGB 11.0 (L) 07/29/2019     Lab Results   Component Value Date    HCT 37.4 (L) 07/29/2019     Iron panel:  Lab Results   Component Value Date    IRON 92 06/25/2019    TIBC 136.1 (L) 06/25/2019    FERRITIN 1,820.0 (H) 04/20/2016     Lab Results   Component Value Date    Iron Saturation (%) 68 (H) 06/25/2019    Iron Saturation (%) 28 12/25/2011       Prior ESA use: none post txp    Plan: stable. Continue to monitor.     DM:   Lab Results   Component Value Date A1C 6.2 (H) 06/21/2019   . Goal A1c < 7  History of Dm? Yes: T2DM pre transplant that was diet controlled  Established with endocrinologist/PCP for BG managment? No  Currently on: no meds  Home BS log: not checking  Diet: excellent appetite  Exercise:not yet  Fluid intake: 4-5 bottles daily  Plan:  Start metformin XR 500 mg daily x7 then increase to 1000 mg daily    Electrolytes: K 5.8  Meds currently on: mg plus protein 266 mg TID  Plan:  If calcium increases he still has Sensipar 30 mg daily at home and tolerated well pre transplant.  Counseled on low K+ diet. Continue to monitor     GI/BM: pt reports constipation  Meds currently on: docusate PRN (using occasionally), Miralax PRN (not using), omeprazole 20 mg daily  Plan: Consider stopping omeprazole vs change to H2RA at future visit; he'd been on PPI post GIB 2/2 NSAID use many years ago. Continue to monitor    Pain: pt reports mild incisional site pain  Meds currently on: APAP PRN (not using), tramadol PRN (using 3 times since transplant), gabapentin per protocol  Plan: Encouraged APAP use prior to tramadol use Continue to monitor    Bone health:   Vitamin D Level: 12.3 on 06/30/19. Goal > 30.   Last DEXA results:  none available  Current meds include: none  Plan: Vitamin D level  out of goal.  Start ergocalciferol at next visit. Continue to monitor.     Women's/Men's Health:  Frank Leach is a 68 y.o. male. Patient reports no men's/women's health issues  Plan: Continue to monitor    Gout - last gout flare >2 years ago  Meds currently on: allopurinol 100 mg daily  Plan: continue to monitor    Pharm[facility] preference:  [facility]    Adherence: Patient has poor understanding of medications; was not able to independently identify names/doses of immunosuppressants and OI meds.  Patient  does not fill their own pill box on a regular basis at home.  His wife manages meds  Patient brought medication card:yes  Pill box:was correct Plan: Encouraged patient to start to learn meds and eventually help with pill box fill; provided extensive adherence counseling/intervention    Spent approximately 40 minutes on educating this patient and greater than 50% was spent in direct face to face counseling regarding post transplant medication education. Questions and concerns were address to patient's satisfaction.    Patient was reviewed with Darolyn Rua, AGNP who was agreement with the stated plan:     During this visit, the following was completed:   BG log data assessment  BP log data assessment  Labs ordered and evaluated  complex treatment plan >1 DS   Patient education was completed for 11-24 minutes     All questions/concerns were addressed to the patient's satisfaction.  __________________________________________  Cleone Slim, PHARMD, CPP  [employer] TRANSPLANT CLINICAL PHARMACIST PRACTITIONER  PAGER [phone]

## 2019-07-29 NOTE — Unmapped (Signed)
Procedures    Procedure:  Flexible Cystoscopy (52000); Ureteral Stent Removal (08657)  Jena Gauss [id number]    Indication:  Patient is a 68 y.o. male with a history of kidney transplant. He presents for transplant stent removal.    Findings:  Successful removal of intact transplant ureteral stent     Timeout was performed and the correct patient, procedure, and participants were identified.      Description:  The patient was positioned supine on the table in the supine position and the external genitalia were prepped and draped in standard fashion.  The 17 French Olympus flexible cystoscope was inserted per urethra with copious lubrication and saline irrigation running.      Meatus was normal. Upon entry into the bladder, the urine was clear. Flexible cystoscopy was performed. Implanted ureteral orifice was identified in the right upper dome. Stent was without encrustation. A flexible grasper was used to remove the stent. It was subsequently inspected and noted to be fully intact. The patient tolerated the procedure well.     Complications:  None    Plan:   1. The patient was not given one dose of antibiotic prophylaxis afterwards as he is on bactrim every day.  2. Patient will follow up with transplant surgery as planned.

## 2019-07-29 NOTE — Unmapped (Signed)
Change in Valganciclovir dosage increase from every other day to daily. Refill too soon until 08/01/2019. Will attempt test claim on 08/04/2019 date.

## 2019-07-29 NOTE — Unmapped (Signed)
Urine collected and sent to the lab

## 2019-07-29 NOTE — Unmapped (Signed)
[facility]:  Taking Care of Yourself After Cystoscopy Procedures    *Drink plenty of water for a day or two following your procedure.  Try to have about 8 ounces (one cup) at a time, and do this 6 times or more per day.  (If you have fluid restrictions, please ask the nurse or doctor for advice).    *AVOID alcoholic, carbonated and caffeinated drinks for a day or two, as they may cause uncomfortable symptoms.    *For the first 8 hours after the procedure, your urine may be pink or red in color.  Small clots or a few drops of blood can be a normal side effect of the instruments.  Large amounts of bleeding or difficulty urinating are not normal.  Call your doctor if this happens.    *You may experience some mild discomfort of a burning sensation with urination after having this procedure.  If it does not improve, or if other symptoms appear (fever, chills, or difficulty emptying), call your doctor.    *You may return to normal daily activities such as work, school, driving, exercising and housework.    *If your doctor gave you a prescription, take it as ordered.    *If you need a return appointment, the secretary will make it for you when you check out. To contact the Urology Clinic during business hours, call [phone].    *[facility] Operator can be reached at [phone] if you need to get in contact with your doctor.  After the hours, the operator can page the doctor on call for urgent concerns:    Urology Patients should ask for the Urology resident “on call”.    You can get more immediate assistance at the Emergency Room or Urgent Care if necessary.

## 2019-07-31 ENCOUNTER — Encounter: Admit: 2019-07-31 | Discharge: 2019-08-01 | Payer: MEDICARE

## 2019-07-31 LAB — RENAL FUNCTION PANEL
ALBUMIN: 4.2 g/dL (ref 3.5–5.0)
ANION GAP: 10 mmol/L (ref 7–15)
BLOOD UREA NITROGEN: 23 mg/dL — ABNORMAL HIGH (ref 7–21)
BUN / CREAT RATIO: 13
CALCIUM: 10.1 mg/dL (ref 8.5–10.2)
CHLORIDE: 108 mmol/L — ABNORMAL HIGH (ref 98–107)
CREATININE: 1.82 mg/dL — ABNORMAL HIGH (ref 0.70–1.30)
EGFR CKD-EPI AA MALE: 44 mL/min/{1.73_m2} — ABNORMAL LOW (ref >=60)
EGFR CKD-EPI NON-AA MALE: 38 mL/min/{1.73_m2} — ABNORMAL LOW (ref >=60)
GLUCOSE RANDOM: 173 mg/dL (ref 70–179)
PHOSPHORUS: 3.5 mg/dL (ref 2.9–4.7)
POTASSIUM: 5.8 mmol/L — ABNORMAL HIGH (ref 3.5–5.0)
SODIUM: 140 mmol/L (ref 135–145)

## 2019-07-31 LAB — CBC W/ AUTO DIFF
BASOPHILS ABSOLUTE COUNT: 0.1 10*9/L (ref 0.0–0.1)
BASOPHILS RELATIVE PERCENT: 1.5 %
EOSINOPHILS ABSOLUTE COUNT: 0.1 10*9/L (ref 0.0–0.7)
EOSINOPHILS RELATIVE PERCENT: 1.8 %
HEMATOCRIT: 30.6 % — ABNORMAL LOW (ref 38.0–50.0)
HEMOGLOBIN: 10.3 g/dL — ABNORMAL LOW (ref 13.5–17.5)
LYMPHOCYTES ABSOLUTE COUNT: 0.1 10*9/L — ABNORMAL LOW (ref 0.7–4.0)
MEAN CORPUSCULAR HEMOGLOBIN: 32.6 pg (ref 26.0–34.0)
MEAN CORPUSCULAR VOLUME: 96.9 fL — ABNORMAL HIGH (ref 81.0–95.0)
MEAN PLATELET VOLUME: 8.6 fL (ref 7.0–10.0)
MONOCYTES ABSOLUTE COUNT: 0.6 10*9/L (ref 0.1–1.0)
MONOCYTES RELATIVE PERCENT: 14.3 %
NEUTROPHILS ABSOLUTE COUNT: 3.3 10*9/L (ref 1.7–7.7)
NEUTROPHILS RELATIVE PERCENT: 80.5 %
PLATELET COUNT: 278 10*9/L (ref 150–450)
RED CELL DISTRIBUTION WIDTH: 14 % (ref 12.0–15.0)
WBC ADJUSTED: 4.1 10*9/L (ref 3.5–10.5)

## 2019-07-31 LAB — MAGNESIUM
MAGNESIUM: 1.2 mg/dL — ABNORMAL LOW (ref 1.6–2.2)
Magnesium:MCnc:Pt:Ser/Plas:Qn:: 1.2 — ABNORMAL LOW

## 2019-07-31 LAB — TACROLIMUS BLOOD: Lab: 8.2

## 2019-07-31 LAB — POTASSIUM: Potassium:SCnc:Pt:Ser/Plas:Qn:: 5.8 — ABNORMAL HIGH

## 2019-07-31 LAB — EOSINOPHILS RELATIVE PERCENT: Eosinophils/100 leukocytes:NFr:Pt:Bld:Qn:Automated count: 1.8

## 2019-07-31 MED ORDER — ENVARSUS XR 1 MG TABLET,EXTENDED RELEASE
ORAL_TABLET | 11 refills | 0 days
Start: 2019-07-31 — End: ?

## 2019-08-04 ENCOUNTER — Encounter: Admit: 2019-08-04 | Discharge: 2019-08-05 | Payer: MEDICARE

## 2019-08-04 LAB — CBC W/ AUTO DIFF
BASOPHILS RELATIVE PERCENT: 2.3 %
EOSINOPHILS ABSOLUTE COUNT: 0.1 10*9/L (ref 0.0–0.7)
EOSINOPHILS RELATIVE PERCENT: 2.3 %
HEMATOCRIT: 31.3 % — ABNORMAL LOW (ref 38.0–50.0)
HEMOGLOBIN: 10.4 g/dL — ABNORMAL LOW (ref 13.5–17.5)
LYMPHOCYTES ABSOLUTE COUNT: 0.1 10*9/L — ABNORMAL LOW (ref 0.7–4.0)
MEAN CORPUSCULAR HEMOGLOBIN: 31.8 pg (ref 26.0–34.0)
MEAN CORPUSCULAR VOLUME: 95.4 fL — ABNORMAL HIGH (ref 81.0–95.0)
MEAN PLATELET VOLUME: 8.2 fL (ref 7.0–10.0)
MONOCYTES ABSOLUTE COUNT: 0.6 10*9/L (ref 0.1–1.0)
MONOCYTES RELATIVE PERCENT: 13.6 %
NEUTROPHILS ABSOLUTE COUNT: 3.6 10*9/L (ref 1.7–7.7)
NEUTROPHILS RELATIVE PERCENT: 79.8 %
PLATELET COUNT: 312 10*9/L (ref 150–450)
RED CELL DISTRIBUTION WIDTH: 14.1 % (ref 12.0–15.0)
WBC ADJUSTED: 4.5 10*9/L (ref 3.5–10.5)

## 2019-08-04 LAB — MAGNESIUM: Magnesium:MCnc:Pt:Ser/Plas:Qn:: 1.2 — ABNORMAL LOW

## 2019-08-04 LAB — RENAL FUNCTION PANEL
ALBUMIN: 4.2 g/dL (ref 3.5–5.0)
ANION GAP: 10 mmol/L (ref 7–15)
BLOOD UREA NITROGEN: 24 mg/dL — ABNORMAL HIGH (ref 7–21)
BUN / CREAT RATIO: 14
CALCIUM: 10.1 mg/dL (ref 8.5–10.2)
CHLORIDE: 108 mmol/L — ABNORMAL HIGH (ref 98–107)
CO2: 23 mmol/L (ref 22.0–30.0)
CREATININE: 1.66 mg/dL — ABNORMAL HIGH (ref 0.70–1.30)
EGFR CKD-EPI AA MALE: 49 mL/min/{1.73_m2} — ABNORMAL LOW (ref >=60)
EGFR CKD-EPI NON-AA MALE: 42 mL/min/{1.73_m2} — ABNORMAL LOW (ref >=60)
GLUCOSE RANDOM: 163 mg/dL (ref 70–179)
PHOSPHORUS: 3.2 mg/dL (ref 2.9–4.7)
POTASSIUM: 5.1 mmol/L — ABNORMAL HIGH (ref 3.5–5.0)
SODIUM: 141 mmol/L (ref 135–145)

## 2019-08-04 LAB — MONOCYTES ABSOLUTE COUNT: Monocytes:NCnc:Pt:Bld:Qn:Automated count: 0.6

## 2019-08-04 LAB — TACROLIMUS BLOOD: Lab: 6.1

## 2019-08-04 LAB — PHOSPHORUS: Phosphate:MCnc:Pt:Ser/Plas:Qn:: 3.2

## 2019-08-04 MED FILL — PRAVASTATIN 40 MG TABLET: 30 days supply | Qty: 30 | Fill #0 | Status: AC

## 2019-08-04 NOTE — Unmapped (Signed)
Change in Valganciclovir dosage increase. Co-pay $19.96.

## 2019-08-07 ENCOUNTER — Encounter: Admit: 2019-08-07 | Discharge: 2019-08-08 | Payer: MEDICARE

## 2019-08-07 LAB — RENAL FUNCTION PANEL
ALBUMIN: 4.4 g/dL (ref 3.5–5.0)
ANION GAP: 9 mmol/L (ref 7–15)
BLOOD UREA NITROGEN: 25 mg/dL — ABNORMAL HIGH (ref 7–21)
BUN / CREAT RATIO: 14
CALCIUM: 10.4 mg/dL — ABNORMAL HIGH (ref 8.5–10.2)
CHLORIDE: 107 mmol/L (ref 98–107)
CO2: 22 mmol/L (ref 22.0–30.0)
EGFR CKD-EPI AA MALE: 43 mL/min/{1.73_m2} — ABNORMAL LOW (ref >=60)
EGFR CKD-EPI NON-AA MALE: 37 mL/min/{1.73_m2} — ABNORMAL LOW (ref >=60)
GLUCOSE RANDOM: 154 mg/dL (ref 70–179)
PHOSPHORUS: 3.3 mg/dL (ref 2.9–4.7)
POTASSIUM: 5.7 mmol/L — ABNORMAL HIGH (ref 3.5–5.0)
SODIUM: 138 mmol/L (ref 135–145)

## 2019-08-07 LAB — CBC W/ AUTO DIFF
BASOPHILS ABSOLUTE COUNT: 0 10*9/L (ref 0.0–0.1)
EOSINOPHILS ABSOLUTE COUNT: 0.1 10*9/L (ref 0.0–0.7)
EOSINOPHILS RELATIVE PERCENT: 3 %
HEMATOCRIT: 32.3 % — ABNORMAL LOW (ref 38.0–50.0)
HEMOGLOBIN: 10.7 g/dL — ABNORMAL LOW (ref 13.5–17.5)
LYMPHOCYTES ABSOLUTE COUNT: 0.1 10*9/L — ABNORMAL LOW (ref 0.7–4.0)
MEAN CORPUSCULAR HEMOGLOBIN CONC: 33.2 g/dL (ref 30.0–36.0)
MEAN CORPUSCULAR HEMOGLOBIN: 31.8 pg (ref 26.0–34.0)
MEAN CORPUSCULAR VOLUME: 95.9 fL — ABNORMAL HIGH (ref 81.0–95.0)
MEAN PLATELET VOLUME: 8.4 fL (ref 7.0–10.0)
MONOCYTES ABSOLUTE COUNT: 0.5 10*9/L (ref 0.1–1.0)
MONOCYTES RELATIVE PERCENT: 10.6 %
NEUTROPHILS ABSOLUTE COUNT: 3.6 10*9/L (ref 1.7–7.7)
NEUTROPHILS RELATIVE PERCENT: 83.7 %
PLATELET COUNT: 330 10*9/L (ref 150–450)
RED BLOOD CELL COUNT: 3.37 10*12/L — ABNORMAL LOW (ref 4.32–5.72)
RED CELL DISTRIBUTION WIDTH: 13.9 % (ref 12.0–15.0)
WBC ADJUSTED: 4.3 10*9/L (ref 3.5–10.5)

## 2019-08-07 LAB — RED BLOOD CELL COUNT: Lab: 3.37 — ABNORMAL LOW

## 2019-08-07 LAB — CALCIUM: Calcium:MCnc:Pt:Ser/Plas:Qn:: 10.4 — ABNORMAL HIGH

## 2019-08-07 LAB — TACROLIMUS BLOOD: Lab: 8.2

## 2019-08-07 LAB — MAGNESIUM: Magnesium:MCnc:Pt:Ser/Plas:Qn:: 1.3 — ABNORMAL LOW

## 2019-08-09 LAB — HLA DS POST TRANSPLANT
ANTI-DONOR DRW #1 MFI: 182 MFI
ANTI-DONOR HLA-A #1 MFI: 224 MFI
ANTI-DONOR HLA-A #2 MFI: 0 MFI
ANTI-DONOR HLA-B #1 MFI: 54 MFI
ANTI-DONOR HLA-B #2 MFI: 20 MFI
ANTI-DONOR HLA-C #1 MFI: 6 MFI
ANTI-DONOR HLA-C #2 MFI: 227 MFI
ANTI-DONOR HLA-DQB #1 MFI: 209 MFI
ANTI-DONOR HLA-DR #1 MFI: 161 MFI
ANTI-DONOR HLA-DR #2 MFI: 208 MFI

## 2019-08-09 LAB — HLA CL1 ANTIBODY COMM: Lab: 0

## 2019-08-09 LAB — FSAB CLASS 1 ANTIBODY SPECIFICITY: HLA CLASS 1 ANTIBODY RESULT: NEGATIVE

## 2019-08-09 LAB — DONOR HLA-DR ANTIGEN #2

## 2019-08-09 LAB — HLA CL2 AB RESULT: Lab: POSITIVE

## 2019-08-11 ENCOUNTER — Encounter: Admit: 2019-08-11 | Discharge: 2019-08-12 | Payer: MEDICARE

## 2019-08-11 LAB — CBC W/ AUTO DIFF
BASOPHILS ABSOLUTE COUNT: 0.1 10*9/L (ref 0.0–0.1)
EOSINOPHILS RELATIVE PERCENT: 4.1 %
HEMATOCRIT: 32.2 % — ABNORMAL LOW (ref 38.0–50.0)
HEMOGLOBIN: 10.6 g/dL — ABNORMAL LOW (ref 13.5–17.5)
LYMPHOCYTES ABSOLUTE COUNT: 0.1 10*9/L — ABNORMAL LOW (ref 0.7–4.0)
LYMPHOCYTES RELATIVE PERCENT: 2.5 %
MEAN CORPUSCULAR HEMOGLOBIN: 31.6 pg (ref 26.0–34.0)
MEAN CORPUSCULAR VOLUME: 95.7 fL — ABNORMAL HIGH (ref 81.0–95.0)
MEAN PLATELET VOLUME: 7.7 fL (ref 7.0–10.0)
MONOCYTES ABSOLUTE COUNT: 0.3 10*9/L (ref 0.1–1.0)
MONOCYTES RELATIVE PERCENT: 8.9 %
NEUTROPHILS ABSOLUTE COUNT: 3.2 10*9/L (ref 1.7–7.7)
NEUTROPHILS RELATIVE PERCENT: 82.5 %
PLATELET COUNT: 327 10*9/L (ref 150–450)
RED BLOOD CELL COUNT: 3.36 10*12/L — ABNORMAL LOW (ref 4.32–5.72)
RED CELL DISTRIBUTION WIDTH: 13.9 % (ref 12.0–15.0)
WBC ADJUSTED: 3.9 10*9/L (ref 3.5–10.5)

## 2019-08-11 LAB — GLUCOSE RANDOM: Glucose:MCnc:Pt:Ser/Plas:Qn:: 127

## 2019-08-11 LAB — RENAL FUNCTION PANEL
ALBUMIN: 4.3 g/dL (ref 3.5–5.0)
ANION GAP: 9 mmol/L (ref 7–15)
BLOOD UREA NITROGEN: 24 mg/dL — ABNORMAL HIGH (ref 7–21)
BUN / CREAT RATIO: 14
CHLORIDE: 109 mmol/L — ABNORMAL HIGH (ref 98–107)
CO2: 23 mmol/L (ref 22.0–30.0)
CREATININE: 1.74 mg/dL — ABNORMAL HIGH (ref 0.70–1.30)
EGFR CKD-EPI AA MALE: 46 mL/min/{1.73_m2} — ABNORMAL LOW (ref >=60)
EGFR CKD-EPI NON-AA MALE: 40 mL/min/{1.73_m2} — ABNORMAL LOW (ref >=60)
GLUCOSE RANDOM: 127 mg/dL (ref 70–179)
POTASSIUM: 5.6 mmol/L — ABNORMAL HIGH (ref 3.5–5.0)
SODIUM: 141 mmol/L (ref 135–145)

## 2019-08-11 LAB — WBC ADJUSTED: Leukocytes:NCnc:Pt:Bld:Qn:: 3.9

## 2019-08-11 LAB — MAGNESIUM: Magnesium:MCnc:Pt:Ser/Plas:Qn:: 1.3 — ABNORMAL LOW

## 2019-08-11 LAB — TACROLIMUS BLOOD: Lab: 9.1

## 2019-08-14 ENCOUNTER — Encounter: Admit: 2019-08-14 | Discharge: 2019-08-15 | Payer: MEDICARE

## 2019-08-14 LAB — CBC W/ AUTO DIFF
BASOPHILS ABSOLUTE COUNT: 0 10*9/L (ref 0.0–0.1)
BASOPHILS RELATIVE PERCENT: 1.3 %
EOSINOPHILS ABSOLUTE COUNT: 0.2 10*9/L (ref 0.0–0.7)
EOSINOPHILS RELATIVE PERCENT: 4.5 %
HEMATOCRIT: 32.9 % — ABNORMAL LOW (ref 38.0–50.0)
HEMOGLOBIN: 10.9 g/dL — ABNORMAL LOW (ref 13.5–17.5)
LYMPHOCYTES RELATIVE PERCENT: 2.2 %
MEAN CORPUSCULAR HEMOGLOBIN CONC: 33 g/dL (ref 30.0–36.0)
MEAN CORPUSCULAR HEMOGLOBIN: 31.3 pg (ref 26.0–34.0)
MEAN CORPUSCULAR VOLUME: 94.9 fL (ref 81.0–95.0)
MEAN PLATELET VOLUME: 7.9 fL (ref 7.0–10.0)
MONOCYTES RELATIVE PERCENT: 7.6 %
NEUTROPHILS ABSOLUTE COUNT: 3 10*9/L (ref 1.7–7.7)
NEUTROPHILS RELATIVE PERCENT: 84.4 %
RED BLOOD CELL COUNT: 3.47 10*12/L — ABNORMAL LOW (ref 4.32–5.72)
RED CELL DISTRIBUTION WIDTH: 14.1 % (ref 12.0–15.0)
WBC ADJUSTED: 3.6 10*9/L (ref 3.5–10.5)

## 2019-08-14 LAB — LYMPHOCYTES ABSOLUTE COUNT: Lymphocytes:NCnc:Pt:Bld:Qn:Automated count: 0.1 — ABNORMAL LOW

## 2019-08-14 LAB — RENAL FUNCTION PANEL
ALBUMIN: 4.3 g/dL (ref 3.5–5.0)
ANION GAP: 10 mmol/L (ref 7–15)
BLOOD UREA NITROGEN: 23 mg/dL — ABNORMAL HIGH (ref 7–21)
BUN / CREAT RATIO: 13
CALCIUM: 10.2 mg/dL (ref 8.5–10.2)
CHLORIDE: 108 mmol/L — ABNORMAL HIGH (ref 98–107)
CO2: 22 mmol/L (ref 22.0–30.0)
EGFR CKD-EPI AA MALE: 47 mL/min/{1.73_m2} — ABNORMAL LOW (ref >=60)
EGFR CKD-EPI NON-AA MALE: 41 mL/min/{1.73_m2} — ABNORMAL LOW (ref >=60)
GLUCOSE RANDOM: 126 mg/dL (ref 70–179)
PHOSPHORUS: 3.4 mg/dL (ref 2.9–4.7)
POTASSIUM: 5.6 mmol/L — ABNORMAL HIGH (ref 3.5–5.0)
SODIUM: 140 mmol/L (ref 135–145)

## 2019-08-14 LAB — EGFR CKD-EPI AA MALE: Lab: 47 — ABNORMAL LOW

## 2019-08-14 LAB — MAGNESIUM: Magnesium:MCnc:Pt:Ser/Plas:Qn:: 1.2 — ABNORMAL LOW

## 2019-08-14 LAB — SMEAR REVIEW

## 2019-08-14 LAB — TACROLIMUS BLOOD: Lab: 9.5

## 2019-08-15 NOTE — Unmapped (Signed)
[facility] Refill Coordination Note    Specialty Medication(s) to be Shipped:   Transplant: mycophenolate mofetil 180mg  and valgancyclovir 450mg     Other medication(s) to be shipped: Carvedilol 6.25mg , Amlodipine 10mg  and Sulfa-trim [address], DOB: 05-24-1951  Phone: [phone] (home)       All above HIPAA information was verified with patient.     Was a Nurse, learning disability used for this call? No    Completed refill call assessment today to schedule patient's medication shipment from the [facility] [phone]).       Specialty medication(s) and dose(s) confirmed: Patient reports changes to the regimen as follows: Valganciclovir 450mg  now 1QD   Changes to medications: Lamont reports no changes at this time.  Changes to insurance: No  Questions for the pharmacist: No    Confirmed patient received Welcome Packet with first shipment. The patient will receive a drug information handout for each medication shipped and additional FDA Medication Guides as required.       DISEASE/MEDICATION-SPECIFIC INFORMATION        N/A    SPECIALTY MEDICATION ADHERENCE     Medication Adherence    Patient reported X missed doses in the last month: 0  Specialty Medication: Valganciclovir 450mg   Patient is on additional specialty medications: Yes  Additional Specialty Medications: Mycophenolate 180mg   Patient Reported Additional Medication X Missed Doses in the Last Month: 0  Patient is on more than two specialty medications: No  Informant: patient                Valganciclovir 450 mg: 14 days of medicine on hand   Mycophenolate 180 mg: 14 days of medicine on hand         SHIPPING     Shipping address confirmed in Epic.     Delivery Scheduled: Yes, Expected medication delivery date: 08/20/19.     Medication will be delivered via Next Day Courier to the prescription address in Epic [place].    Wyatt Mage M Elisabeth Cara   [facility] Specialty Technician

## 2019-08-18 ENCOUNTER — Ambulatory Visit: Admit: 2019-08-18 | Discharge: 2019-08-19 | Payer: MEDICARE

## 2019-08-18 LAB — RENAL FUNCTION PANEL
ALBUMIN: 4.3 g/dL (ref 3.5–5.0)
ANION GAP: 8 mmol/L (ref 7–15)
BLOOD UREA NITROGEN: 22 mg/dL — ABNORMAL HIGH (ref 7–21)
BUN / CREAT RATIO: 12
CALCIUM: 10.3 mg/dL — ABNORMAL HIGH (ref 8.5–10.2)
CO2: 25 mmol/L (ref 22.0–30.0)
EGFR CKD-EPI AA MALE: 45 mL/min/{1.73_m2} — ABNORMAL LOW (ref >=60)
EGFR CKD-EPI NON-AA MALE: 39 mL/min/{1.73_m2} — ABNORMAL LOW (ref >=60)
GLUCOSE RANDOM: 140 mg/dL (ref 70–179)
PHOSPHORUS: 3.1 mg/dL (ref 2.9–4.7)
POTASSIUM: 5.5 mmol/L — ABNORMAL HIGH (ref 3.5–5.0)
SODIUM: 143 mmol/L (ref 135–145)

## 2019-08-18 LAB — CBC W/ AUTO DIFF
BASOPHILS ABSOLUTE COUNT: 0 10*9/L (ref 0.0–0.1)
BASOPHILS RELATIVE PERCENT: 0.8 %
EOSINOPHILS RELATIVE PERCENT: 4.3 %
HEMOGLOBIN: 11 g/dL — ABNORMAL LOW (ref 13.5–17.5)
LYMPHOCYTES ABSOLUTE COUNT: 0.2 10*9/L — ABNORMAL LOW (ref 0.7–4.0)
LYMPHOCYTES RELATIVE PERCENT: 5.2 %
MEAN CORPUSCULAR HEMOGLOBIN CONC: 33.1 g/dL (ref 30.0–36.0)
MEAN CORPUSCULAR HEMOGLOBIN: 31.6 pg (ref 26.0–34.0)
MEAN CORPUSCULAR VOLUME: 95.6 fL — ABNORMAL HIGH (ref 81.0–95.0)
MEAN PLATELET VOLUME: 7.9 fL (ref 7.0–10.0)
MONOCYTES ABSOLUTE COUNT: 0.2 10*9/L (ref 0.1–1.0)
MONOCYTES RELATIVE PERCENT: 6.2 %
NEUTROPHILS ABSOLUTE COUNT: 3 10*9/L (ref 1.7–7.7)
NEUTROPHILS RELATIVE PERCENT: 83.5 %
PLATELET COUNT: 313 10*9/L (ref 150–450)
RED CELL DISTRIBUTION WIDTH: 13.9 % (ref 12.0–15.0)

## 2019-08-18 LAB — TACROLIMUS BLOOD: Lab: 9.7

## 2019-08-18 LAB — BUN / CREAT RATIO: Urea nitrogen/Creatinine:MRto:Pt:Ser/Plas:Qn:: 12

## 2019-08-18 LAB — MAGNESIUM: Magnesium:MCnc:Pt:Ser/Plas:Qn:: 1.3 — ABNORMAL LOW

## 2019-08-18 LAB — MEAN PLATELET VOLUME: Platelet mean volume:EntVol:Pt:Bld:Qn:Automated count: 7.9

## 2019-08-18 MED ORDER — AMLODIPINE 10 MG TABLET
ORAL_TABLET | Freq: Every day | ORAL | 11 refills | 30.00000 days | Status: CP
Start: 2019-08-18 — End: 2020-08-17
  Filled 2019-08-19: qty 30, 30d supply, fill #0

## 2019-08-19 MED FILL — AMLODIPINE 10 MG TABLET: 30 days supply | Qty: 30 | Fill #0 | Status: AC

## 2019-08-19 MED FILL — MYCOPHENOLATE SODIUM 180 MG TABLET,DELAYED RELEASE: 30 days supply | Qty: 180 | Fill #1 | Status: AC

## 2019-08-19 MED FILL — MYCOPHENOLATE SODIUM 180 MG TABLET,DELAYED RELEASE: ORAL | 30 days supply | Qty: 180 | Fill #1

## 2019-08-19 MED FILL — SULFAMETHOXAZOLE 400 MG-TRIMETHOPRIM 80 MG TABLET: 28 days supply | Qty: 12 | Fill #1 | Status: AC

## 2019-08-19 MED FILL — VALGANCICLOVIR 450 MG TABLET: 30 days supply | Qty: 30 | Fill #0 | Status: AC

## 2019-08-19 MED FILL — SULFAMETHOXAZOLE 400 MG-TRIMETHOPRIM 80 MG TABLET: ORAL | 28 days supply | Qty: 12 | Fill #1

## 2019-08-21 ENCOUNTER — Encounter: Admit: 2019-08-21 | Discharge: 2019-08-22 | Payer: MEDICARE

## 2019-08-21 LAB — CBC W/ AUTO DIFF
BASOPHILS ABSOLUTE COUNT: 0.1 10*9/L (ref 0.0–0.1)
BASOPHILS RELATIVE PERCENT: 1.9 %
EOSINOPHILS ABSOLUTE COUNT: 0.1 10*9/L (ref 0.0–0.7)
EOSINOPHILS RELATIVE PERCENT: 3.9 %
HEMOGLOBIN: 10.9 g/dL — ABNORMAL LOW (ref 13.5–17.5)
LYMPHOCYTES ABSOLUTE COUNT: 0.1 10*9/L — ABNORMAL LOW (ref 0.7–4.0)
MEAN CORPUSCULAR HEMOGLOBIN: 31 pg (ref 26.0–34.0)
MEAN CORPUSCULAR VOLUME: 94.7 fL (ref 81.0–95.0)
MEAN PLATELET VOLUME: 7.7 fL (ref 7.0–10.0)
MONOCYTES ABSOLUTE COUNT: 0.1 10*9/L (ref 0.1–1.0)
MONOCYTES RELATIVE PERCENT: 3.7 %
NEUTROPHILS ABSOLUTE COUNT: 2.9 10*9/L (ref 1.7–7.7)
NEUTROPHILS RELATIVE PERCENT: 88.1 %
PLATELET COUNT: 303 10*9/L (ref 150–450)
RED BLOOD CELL COUNT: 3.52 10*12/L — ABNORMAL LOW (ref 4.32–5.72)
RED CELL DISTRIBUTION WIDTH: 13.9 % (ref 12.0–15.0)
WBC ADJUSTED: 3.3 10*9/L — ABNORMAL LOW (ref 3.5–10.5)

## 2019-08-21 LAB — RENAL FUNCTION PANEL
ALBUMIN: 4.4 g/dL (ref 3.5–5.0)
ANION GAP: 11 mmol/L (ref 7–15)
BLOOD UREA NITROGEN: 24 mg/dL — ABNORMAL HIGH (ref 7–21)
CALCIUM: 10.3 mg/dL — ABNORMAL HIGH (ref 8.5–10.2)
CHLORIDE: 108 mmol/L — ABNORMAL HIGH (ref 98–107)
CO2: 22 mmol/L (ref 22.0–30.0)
CREATININE: 1.9 mg/dL — ABNORMAL HIGH (ref 0.70–1.30)
EGFR CKD-EPI AA MALE: 42 mL/min/{1.73_m2} — ABNORMAL LOW (ref >=60)
EGFR CKD-EPI NON-AA MALE: 36 mL/min/{1.73_m2} — ABNORMAL LOW (ref >=60)
GLUCOSE RANDOM: 144 mg/dL (ref 70–179)
PHOSPHORUS: 3.2 mg/dL (ref 2.9–4.7)
POTASSIUM: 5.5 mmol/L — ABNORMAL HIGH (ref 3.5–5.0)
SODIUM: 141 mmol/L (ref 135–145)

## 2019-08-21 LAB — MAGNESIUM: Magnesium:MCnc:Pt:Ser/Plas:Qn:: 1.1 — ABNORMAL LOW

## 2019-08-21 LAB — WBC ADJUSTED: Leukocytes:NCnc:Pt:Bld:Qn:: 3.3 — ABNORMAL LOW

## 2019-08-21 LAB — TACROLIMUS BLOOD: Lab: 8.4

## 2019-08-21 LAB — ALBUMIN: Albumin:MCnc:Pt:Ser/Plas:Qn:: 4.4

## 2019-08-21 MED FILL — CARVEDILOL 6.25 MG TABLET: 30 days supply | Qty: 60 | Fill #0 | Status: AC

## 2019-08-21 MED FILL — CARVEDILOL 6.25 MG TABLET: ORAL | 30 days supply | Qty: 60 | Fill #0

## 2019-08-21 NOTE — Unmapped (Signed)
abTransplant Nephrology Clinic Visit      History of Present Illness    69 y.o. male here for follow up after kidney transplantation.  Patient has ESRD secondary to HTN.  Patient has been on dialysis since 01/25/2012.  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     Patient was admitted for kidney transplant on 06/21/2019.  Patient experienced hyperkalemia that despite medical management would not decrease.  2 hours of HD performed.  Patient discharged 06/26/2019.       Transplant History:    Organ Received: Left DDKT, DBD, PHS, KDPI: 42%; 21h cold ischemia  Native Kidney Disease: HTN; cPRA: 0%  Date of Transplant: 06/22/2019  Post-Transplant Course: HD once for high potassium  Prior Transplants: none  Induction: [place]  Date of Ureteral Stent Removal: 07/29/2019  CMV/EBV Status: CMV D+/R+  EBV D+/R+  Rejection Episodes: none  Donor Specific Antibodies: none  Results of Renal Imaging (pre and post):   Pre-Txp 08/29/2018 CT RMP  Sequela of bilateral nephrectomy. Interval decrease in linear soft tissue within the right nephrectomy bed, potentially postsurgical or fat necrosis. Unchanged linear tissue within the left nephrectomy bed. No suspicious enhancing lesions are visualized within the surgical beds    Post-Txp 06/25/2019 (txp kidney only)  The renal transplant was located in the left lower quadrant. Normal size and echogenicity.  No solid masses or calculi. Trace perinephric fluid adjacent to the lower pole of the kidney. Mild pelviectasis  - Perfusion: Using power Doppler, normal perfusion was seen throughout the renal parenchyma.  - Resistive indices in the renal transplant are stable compared with prior examination.  - Main renal artery/iliac artery: Patent. Again noted are 3 renal arteries. Resistive indices within the renal arteries are stable to minimally increased, now at or just above normal limits.  - Main renal vein/iliac vein: Patent      Current Immunosuppression Regimen: Envarsus 12 daily  Myfortic 540 mg BID      Subjective/Interval:     He presents today feeling well His home BP have been running 130-160/70-80's after starting Coreg at last visit.  He denies headaches, dizziness, lightheadedness, chest pain, shortness of breath.  His Blood Sugars have been running 120-160's in AM.   He is continuing to have mild hand tremors.  He denies any recent gout flares.  He denies fever, chills, abdominal pain, n/v/d, edema, dysuria, hematuria.     Last dose of Prograf: 9:00am    Past Medical History  1. HTN  2. DM II  3. RCC s/p Bilateral nephrectomy (L 04/2016, R 12/2016)  4. GERD  5. HLD  6. 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    Medications  Current Outpatient Medications   Medication Sig Dispense Refill   ??? allopurinoL (ZYLOPRIM) 100 MG tablet Take 1 tablet (100 mg total) by mouth every evening. 30 tablet 11   ??? amLODIPine (NORVASC) 10 MG tablet Take 1 tablet (10 mg total) by mouth daily. 30 tablet 11   ??? aspirin (ECOTRIN) 81 MG tablet Take 1 tablet (81 mg total) by mouth daily. 30 tablet 11   ??? carvediloL (COREG) 6.25 MG tablet Take 1 tablet (6.25 mg total) by mouth Two (2) times a day. 60 tablet 11   ??? ENVARSUS XR 4 mg Tb24 extended release tablet Take three 4 mg tablets for a total  daily dose of 12 mg by mouth daily 90 tablet 11   ??? magnesium oxide-Mg AA chelate (MAGNESIUM, AMINO ACID CHELATE,) 133 mg Tab Take 2 tablets (266 mg) by mouth Three (3) times a day. 180 tablet 11   ??? metFORMIN (GLUCOPHAGE-XR) 500 MG 24 hr tablet Take 1 tablet by mouth once daily for 7 days then increase to 2 tablets daily 60 tablet 3   ??? mycophenolate (MYFORTIC) 180 MG EC tablet Take 3 tablets (540 mg total) by mouth Two (2) times a day. 180 tablet 11   ??? omeprazole (PRILOSEC) 40 MG capsule Take 1 capsule (40 mg total) by mouth nightly. 30 capsule 11   ??? pravastatin (PRAVACHOL) 40 MG tablet Take 1 tablet (40 mg total) by mouth nightly. 30 tablet 11   ??? semaglutide (OZEMPIC) 0.25 mg or 0.5 mg(2 mg/1.5 mL) PnIj Inject 0.25 mg under the skin every seven (7) days. 1.5 mL 5   ??? sulfamethoxazole-trimethoprim (BACTRIM) 400-80 mg per tablet Take 1 tablet (80 mg of trimethoprim total) by mouth Every Monday, Wednesday, and Friday. 12 tablet 5   ??? valGANciclovir (VALCYTE) 450 mg tablet Take 1 tablet (450 mg total) by mouth daily. 30 tablet 1   ??? acetaminophen (TYLENOL) 500 MG tablet Take 1-2 tablets (500-1,000 mg total) by mouth every six (6) hours as needed for pain or fever (> 38C). (Patient not taking: Reported on 08/26/2019) 100 tablet 0   ??? cinacalcet (SENSIPAR) 60 MG tablet Hold (Patient not taking: Reported on 08/26/2019)     ??? ENVARSUS XR 1 mg Tb24 extended release tablet Hold (Patient not taking: Reported on 08/26/2019) 90 tablet 11     No current facility-administered medications for this visit.            Physical Exam  BP 157/77 (BP Site: R Arm, BP Position: Sitting, BP Cuff Size: Medium)  - Pulse 71  - Temp 36.4 ??C (97.6 ??F) (Tympanic)  - Ht 167.6 cm (5' 6)  - Wt 80.1 kg (176 lb 9.6 oz)  - SpO2 99%  - BMI 28.50 kg/m??   General: no acute distress  HEENT: wearing mask, PERRL  Neck: neck supple, no cervical lymphadenopathy appreciated  CV: normal rate, normal rhythm, no murmur, no gallops, no rubs appreciated  Lungs: clear to auscultation bilaterally  Abdomen: soft, non tender, incision c/d/i; no erythema or edema noted  Extremities:  no edema,   Musculoskeletal: no visible deformity, normal range of motion.  Pulses: intact distally throughout  Neurologic: awake, alert, and oriented x3    Laboratory Data and Imaging reviewed in EPIC      Assessment:  69 y.o. 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         Recommendations/Plan:     Allograft Function: Renal function stable at 1.80 with baseline of approximately 1.6-1.8 since transplant.  UPC 0.081.  No DSAs as of 07/29/19.     Immunosuppression Management [High Risk Medical Decision Making For Drug Therapy Requiring Intensive Monitoring For Toxicity]: Tacrolimus trough level 9 today. Envarsus 12mg .  Targeting tacrolimus trough levels of approximately 8-10 ng/mL.   Continue mycophenolate 540 mg BID.     Blood Pressure Management: BP 157/77 at this visit. Increase Coreg to 12.50 mg BID.      Lipid Management: Last lipid panel on 08/20/2018.  Patient switched to Atorvastatin today. The 10-year ASCVD risk score Denman George DC Montez Hageman., et al.,  2013) is: 45%    Electrolytes: Electrolyte and metabolic parameters in acceptable range.  Will continue to monitor.  Magnesium 1.2 today.  Increase magnesium supplements to TID.  Vitamin D level 12.3.  Ergocalciferol started today.      Infectious Prophylaxis and Monitoring: CMV VL pending today.  Decoy cells not detected 07/29/19.   BK VL pending today.  The patient continues on Valcyte (end 09/22/19) and Bactrim (end 12/20/19) prophylaxis.     Gout: No flares recently.  Continue on Allopurinol.     RCC s/p bilateral nephrectomy: Last CT RMP 08/29/2018 showed no suspicious enhancing lesions.  Abdominal ultrasound due 08/2019.to be scheduled.      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  Q-Gold negative 08/20/2018    Increased Blood Sugar: Increase Metformin to 500mg  daily.  Pending insurance clearance, will start patient on Semaglutide.     Health Maintenance:   Colonoscopy: 04/30/2018 - 1 adenomatous polyp; repeat 5-10 years    Immunizations:   Flu Shot: 04/30/2019  Prevnar 13: 10/30/2013  Pneumovax: 04/24/2016, due 2022  No immunizations till 1 year post transplant.  Covid Vaccine discussed with patient.  He is currently working on getting an appointment scheduled    Counseling:  I counseled the patient on:  The need to avoid sun exposure and the use of sunblock while outdoors given the relatively higher risk of skin malignancy in an immunosuppressed state. The need for adherence to immunosuppression medication.  Patient verbalized understanding.     Follow-Up:  Return to clinic in 4 weeks at [facility] with Nivin - Graduate today  Labs: twice a week  Patient will continue to follow-up with his primary care provider for non-transplant related issues and medication refills. We have ordered transplant specific labs per the center's guidelines to monitor and assess for toxicities from immunosuppressant drug therapy        Latesa Fratto L. Marina Goodell, MSN, APRN, AGNP-C - Kidney Transplant Nurse Practitioner  [facility]

## 2019-08-25 DIAGNOSIS — Z940 Kidney transplant status: Principal | ICD-10-CM

## 2019-08-25 DIAGNOSIS — Z79899 Other long term (current) drug therapy: Principal | ICD-10-CM

## 2019-08-26 ENCOUNTER — Encounter: Admit: 2019-08-26 | Discharge: 2019-08-26 | Payer: MEDICARE

## 2019-08-26 ENCOUNTER — Ambulatory Visit: Admit: 2019-08-26 | Discharge: 2019-08-26 | Payer: MEDICARE

## 2019-08-26 DIAGNOSIS — D899 Disorder involving the immune mechanism, unspecified: Principal | ICD-10-CM

## 2019-08-26 DIAGNOSIS — Z794 Long term (current) use of insulin: Secondary | ICD-10-CM

## 2019-08-26 DIAGNOSIS — Z940 Kidney transplant status: Principal | ICD-10-CM

## 2019-08-26 DIAGNOSIS — Z905 Acquired absence of kidney: Principal | ICD-10-CM

## 2019-08-26 DIAGNOSIS — C642 Malignant neoplasm of left kidney, except renal pelvis: Principal | ICD-10-CM

## 2019-08-26 DIAGNOSIS — I10 Essential (primary) hypertension: Principal | ICD-10-CM

## 2019-08-26 DIAGNOSIS — E0801 Diabetes mellitus due to underlying condition with hyperosmolarity with coma: Principal | ICD-10-CM

## 2019-08-26 DIAGNOSIS — C641 Malignant neoplasm of right kidney, except renal pelvis: Principal | ICD-10-CM

## 2019-08-26 DIAGNOSIS — E559 Vitamin D deficiency, unspecified: Principal | ICD-10-CM

## 2019-08-26 DIAGNOSIS — Z79899 Other long term (current) drug therapy: Principal | ICD-10-CM

## 2019-08-26 LAB — URINALYSIS
BACTERIA: NONE SEEN /HPF
BILIRUBIN UA: NEGATIVE
BLOOD UA: NEGATIVE
GLUCOSE UA: NEGATIVE
KETONES UA: NEGATIVE
LEUKOCYTE ESTERASE UA: NEGATIVE
PROTEIN UA: NEGATIVE
RBC UA: <1 /HPF (ref <=3)
SQUAMOUS EPITHELIAL: <1 /HPF (ref 0–5)
UROBILINOGEN UA: 0.2
WBC UA: <1 /HPF (ref <=2)

## 2019-08-26 LAB — CBC W/ AUTO DIFF
BASOPHILS ABSOLUTE COUNT: 0.1 10*9/L (ref 0.0–0.1)
BASOPHILS RELATIVE PERCENT: 2.5 %
EOSINOPHILS ABSOLUTE COUNT: 0.2 10*9/L (ref 0.0–0.4)
HEMATOCRIT: 38.2 % — ABNORMAL LOW (ref 41.0–53.0)
HEMOGLOBIN: 11.5 g/dL — ABNORMAL LOW (ref 13.5–17.5)
LARGE UNSTAINED CELLS: 1 % (ref 0–4)
LYMPHOCYTES ABSOLUTE COUNT: 0.1 10*9/L — ABNORMAL LOW (ref 1.5–5.0)
LYMPHOCYTES RELATIVE PERCENT: 4.1 %
MEAN CORPUSCULAR HEMOGLOBIN: 30.5 pg (ref 26.0–34.0)
MEAN CORPUSCULAR VOLUME: 101.3 fL — ABNORMAL HIGH (ref 80.0–100.0)
MEAN PLATELET VOLUME: 8.4 fL (ref 7.0–10.0)
MONOCYTES ABSOLUTE COUNT: 0.1 10*9/L — ABNORMAL LOW (ref 0.2–0.8)
MONOCYTES RELATIVE PERCENT: 4.1 %
NEUTROPHILS ABSOLUTE COUNT: 2.8 10*9/L (ref 2.0–7.5)
NEUTROPHILS RELATIVE PERCENT: 83.9 %
PLATELET COUNT: 316 10*9/L (ref 150–440)
RED BLOOD CELL COUNT: 3.77 10*12/L — ABNORMAL LOW (ref 4.50–5.90)
RED CELL DISTRIBUTION WIDTH: 14.7 % (ref 12.0–15.0)
WBC ADJUSTED: 3.3 10*9/L — ABNORMAL LOW (ref 4.5–11.0)

## 2019-08-26 LAB — COMPREHENSIVE METABOLIC PANEL
ALBUMIN: 4.3 g/dL (ref 3.5–5.0)
ALKALINE PHOSPHATASE: 125 U/L (ref 38–126)
ALT (SGPT): 15 U/L (ref <50)
ANION GAP: 9 mmol/L (ref 7–15)
AST (SGOT): 23 U/L (ref 19–55)
BILIRUBIN TOTAL: 0.5 mg/dL (ref 0.0–1.2)
BLOOD UREA NITROGEN: 23 mg/dL — ABNORMAL HIGH (ref 7–21)
BUN / CREAT RATIO: 13
CALCIUM: 10.6 mg/dL — ABNORMAL HIGH (ref 8.5–10.2)
CHLORIDE: 109 mmol/L — ABNORMAL HIGH (ref 98–107)
EGFR CKD-EPI AA MALE: 44 mL/min/{1.73_m2} — ABNORMAL LOW (ref >=60)
EGFR CKD-EPI NON-AA MALE: 38 mL/min/{1.73_m2} — ABNORMAL LOW (ref >=60)
GLUCOSE RANDOM: 156 mg/dL — ABNORMAL HIGH (ref 70–99)
POTASSIUM: 5.7 mmol/L — ABNORMAL HIGH (ref 3.5–5.0)
PROTEIN TOTAL: 7.5 g/dL (ref 6.5–8.3)
SODIUM: 141 mmol/L (ref 135–145)

## 2019-08-26 LAB — SLIDE REVIEW

## 2019-08-26 LAB — TACROLIMUS BLOOD: Lab: 9

## 2019-08-26 LAB — SMEAR REVIEW

## 2019-08-26 LAB — SPECIFIC GRAVITY UA: Specific gravity:Rden:Pt:Urine:Qn:: 1.014

## 2019-08-26 LAB — CREATININE, URINE: Lab: 116.3

## 2019-08-26 LAB — PROTEIN / CREATININE RATIO, URINE: PROTEIN/CREAT RATIO, URINE: 0.081

## 2019-08-26 LAB — PHOSPHORUS: Phosphate:MCnc:Pt:Ser/Plas:Qn:: 3.1

## 2019-08-26 LAB — LARGE UNSTAINED CELLS: Lab: 1

## 2019-08-26 LAB — BUN / CREAT RATIO: Urea nitrogen/Creatinine:MRto:Pt:Ser/Plas:Qn:: 13

## 2019-08-26 LAB — MAGNESIUM: Magnesium:MCnc:Pt:Ser/Plas:Qn:: 1.2 — ABNORMAL LOW

## 2019-08-26 MED ORDER — OZEMPIC 0.25 MG OR 0.5 MG (2 MG/1.5 ML) SUBCUTANEOUS PEN INJECTOR
SUBCUTANEOUS | 5 refills | 56 days | Status: CP
Start: 2019-08-26 — End: 2019-08-26
  Filled 2019-08-27: qty 30, 30d supply, fill #0

## 2019-08-26 MED ORDER — ERGOCALCIFEROL (VITAMIN D2) 1,250 MCG (50,000 UNIT) CAPSULE
ORAL_CAPSULE | ORAL | 1 refills | 28 days | Status: CP
Start: 2019-08-26 — End: 2020-08-25
  Filled 2019-08-27: qty 4, 28d supply, fill #0

## 2019-08-26 MED ORDER — SILDENAFIL 50 MG TABLET
ORAL_TABLET | Freq: Every evening | ORAL | 1 refills | 20 days | Status: CP | PRN
Start: 2019-08-26 — End: 2020-08-25

## 2019-08-26 MED ORDER — OMEPRAZOLE 40 MG CAPSULE,DELAYED RELEASE
ORAL_CAPSULE | ORAL | 3 refills | 90 days | Status: CP
Start: 2019-08-26 — End: ?
  Filled 2019-08-27: qty 45, 90d supply, fill #0

## 2019-08-26 MED ORDER — SEMAGLUTIDE 3 MG TABLET
ORAL_TABLET | Freq: Every day | ORAL | 3 refills | 0.00000 days | Status: CP
Start: 2019-08-26 — End: ?

## 2019-08-26 MED ORDER — MG-PLUS-PROTEIN 133 MG TABLET
ORAL_TABLET | Freq: Three times a day (TID) | ORAL | 3 refills | 90.00000 days | Status: CP
Start: 2019-08-26 — End: ?
  Filled 2019-08-27: qty 810, 90d supply, fill #0

## 2019-08-26 MED ORDER — METFORMIN ER 500 MG TABLET,EXTENDED RELEASE 24 HR
ORAL_TABLET | Freq: Every day | ORAL | 3 refills | 90.00000 days | Status: CP
Start: 2019-08-26 — End: ?
  Filled 2019-08-27: qty 180, 90d supply, fill #0

## 2019-08-26 MED ORDER — CARVEDILOL 6.25 MG TABLET
ORAL_TABLET | Freq: Two times a day (BID) | ORAL | 3 refills | 90.00000 days | Status: CP
Start: 2019-08-26 — End: 2020-08-25
  Filled 2019-09-01: qty 360, 90d supply, fill #0

## 2019-08-26 MED ORDER — ATORVASTATIN 40 MG TABLET
ORAL_TABLET | Freq: Every day | ORAL | 3 refills | 90.00000 days | Status: CP
Start: 2019-08-26 — End: 2020-08-25
  Filled 2019-08-27: qty 90, 90d supply, fill #0

## 2019-08-26 NOTE — Unmapped (Signed)
Urine was collected and sent to the lab.

## 2019-08-27 LAB — BK BLOOD LOG(10): Lab: 0

## 2019-08-27 LAB — BK VIRUS QUANTITATIVE PCR, BLOOD

## 2019-08-27 LAB — CMV DNA, QUANTITATIVE, PCR

## 2019-08-27 LAB — CMV COMMENT: Lab: 0

## 2019-08-27 MED FILL — MG-PLUS-PROTEIN 133 MG TABLET: 90 days supply | Qty: 810 | Fill #0 | Status: AC

## 2019-08-27 MED FILL — ATORVASTATIN 40 MG TABLET: 90 days supply | Qty: 90 | Fill #0 | Status: AC

## 2019-08-27 MED FILL — OMEPRAZOLE 40 MG CAPSULE,DELAYED RELEASE: 90 days supply | Qty: 45 | Fill #0 | Status: AC

## 2019-08-27 MED FILL — ERGOCALCIFEROL (VITAMIN D2) 1,250 MCG (50,000 UNIT) CAPSULE: 28 days supply | Qty: 4 | Fill #0 | Status: AC

## 2019-08-27 MED FILL — RYBELSUS 3 MG TABLET: 30 days supply | Qty: 30 | Fill #0 | Status: AC

## 2019-08-27 MED FILL — METFORMIN ER 500 MG TABLET,EXTENDED RELEASE 24 HR: 90 days supply | Qty: 180 | Fill #0 | Status: AC

## 2019-08-27 NOTE — Unmapped (Signed)
[facility] TRANSPLANT CLINIC PHARMACY NOTE  08/26/2019   Frank Leach  [id number]    Medication changes today:   1. Start Rybelsus 3 mg daily PO   2. INCREASE carvedilol to 12.5 mg BID  3. STOP pravastatin  4. START atorvastatin 40 mg daily   5. CHANGE omeprazole to every other day  6. INCREASE Mg to 3 tabs TID  7. START ergocalciferol 50,000 units weekly x 8-12 weeks     Education/Adherence tools provided today:  1.provided updated medication list  2. provided additional education on immunosuppression and transplant related medications including reviewing indications of medications, dosing and side effects  3.  Provided additional education on metformin and carvedilol    Follow up items:  1. goal of understanding indications and dosing of immunosuppression medications  2. Adjust metformin as renal function allows  3. Mg, K  4. Assess PPI use    Next visit with pharmacy in 1-3 months  ____________________________________________________________________    Frank Leach is a 69 y.o. male s/p deceased kidney transplant on 07/07/2019 (Kidney) 2/2 HTN and T2DM.     Other PMH significant for diabetes (diet controlled pre transplant), gout, HTN    Seen by pharmacy today for: medication management and pill box fill and adherence education; last seen by pharmacy 4 weeks ago     CC:  Patient has no complaints today     Vitals:    08/26/19 0917   BP: 157/77   Pulse: 71   Temp: 36.4 ??C (97.5 ??F)       No Known Allergies    All medications reviewed and updated.     Medication list includes revisions made during today???s encounter    Outpatient Encounter Medications as of 08/26/2019   Medication Sig Dispense Refill   ??? acetaminophen (TYLENOL) 500 MG tablet Take 1-2 tablets (500-1,000 mg total) by mouth every six (6) hours as needed for pain or fever (> 38C). (Patient not taking: Reported on 08/26/2019) 100 tablet 0 ??? allopurinoL (ZYLOPRIM) 100 MG tablet Take 1 tablet (100 mg total) by mouth every evening. 30 tablet 11   ??? amLODIPine (NORVASC) 10 MG tablet Take 1 tablet (10 mg total) by mouth daily. 30 tablet 11   ??? aspirin (ECOTRIN) 81 MG tablet Take 1 tablet (81 mg total) by mouth daily. 30 tablet 11   ??? atorvastatin (LIPITOR) 40 MG tablet Take 1 tablet (40 mg total) by mouth daily. 90 tablet 3   ??? carvediloL (COREG) 6.25 MG tablet Take 2 tablets (12.5 mg total) by mouth Two (2) times a day. 360 tablet 3   ??? cinacalcet (SENSIPAR) 60 MG tablet Hold (Patient not taking: Reported on 08/26/2019)     ??? ENVARSUS XR 1 mg Tb24 extended release tablet Hold (Patient not taking: Reported on 08/26/2019) 90 tablet 11   ??? ENVARSUS XR 4 mg Tb24 extended release tablet Take three 4 mg tablets for a total daily dose of 12 mg by mouth daily 90 tablet 11   ??? ergocalciferol (DRISDOL) 1,250 mcg (50,000 unit) capsule Take 1 capsule (50,000 Units total) by mouth once a week. 4 capsule 1   ??? magnesium oxide-Mg AA chelate (MAGNESIUM, AMINO ACID CHELATE,) 133 mg Tab Take 3 tablets by mouth Three (3) times a day. 810 tablet 3   ??? metFORMIN (GLUCOPHAGE-XR) 500 MG 24 hr tablet Take 2 tablets (1,000 mg total) by mouth once daily. 180 tablet 3   ??? mycophenolate (MYFORTIC) 180 MG EC tablet Take 3  tablets (540 mg total) by mouth Two (2) times a day. 180 tablet 11   ??? omeprazole (PRILOSEC) 40 MG capsule Take 1 capsule (40 mg total) by mouth every other day. 45 capsule 3   ??? semaglutide 3 mg Tab Take 1 tablet (3 mg) by mouth daily. 90 tablet 3   ??? sulfamethoxazole-trimethoprim (BACTRIM) 400-80 mg per tablet Take 1 tablet (80 mg of trimethoprim total) by mouth Every Monday, Wednesday, and Friday. 12 tablet 5   ??? valGANciclovir (VALCYTE) 450 mg tablet Take 1 tablet (450 mg total) by mouth daily. 30 tablet 1   ??? [DISCONTINUED] carvediloL (COREG) 6.25 MG tablet Take 1 tablet (6.25 mg total) by mouth Two (2) times a day. 60 tablet 11 ??? [DISCONTINUED] magnesium oxide-Mg AA chelate (MAGNESIUM, AMINO ACID CHELATE,) 133 mg Tab Take 2 tablets (266 mg) by mouth Three (3) times a day. 180 tablet 11   ??? [DISCONTINUED] metFORMIN (GLUCOPHAGE-XR) 500 MG 24 hr tablet Take 1 tablet by mouth once daily for 7 days then increase to 2 tablets daily 60 tablet 3   ??? [DISCONTINUED] omeprazole (PRILOSEC) 40 MG capsule Take 1 capsule (40 mg total) by mouth nightly. 30 capsule 11   ??? [DISCONTINUED] pravastatin (PRAVACHOL) 40 MG tablet Take 1 tablet (40 mg total) by mouth nightly. 30 tablet 11   ??? [DISCONTINUED] semaglutide (OZEMPIC) 0.25 mg or 0.5 mg(2 mg/1.5 mL) PnIj Inject 0.25 mg under the skin every seven (7) days. 1.5 mL 5     No facility-administered encounter medications on file as of 08/26/2019.        Induction agent : alemtuzumab    CURRENT IMMUNOSUPPRESSION: Envarsus 12 mg PO qd   prograf/Envarsus/cyclosporine goal: 8-10   myfortic540  mg PO bid    steroid free     Patient is tolerating immunosuppression well    IMMUNOSUPPRESSION DRUG LEVELS:  Lab Results   Component Value Date    Tacrolimus, Trough 2.1 (L) 06/26/2019    Tacrolimus, Trough 3.0 (L) 06/25/2019    Tacrolimus, Trough 6.5 06/24/2019    Tacrolimus, Timed 9.0 08/26/2019    Tacrolimus, Timed 8.4 08/21/2019    Tacrolimus, Timed 9.7 08/18/2019     No results found for: CYCLO  No results found for: EVEROLIMUS  No results found for: SIROLIMUS    Envarsus level is accurate 24 hour trough    Graft function: stable  DSA: ntd  Biopsies to date: mild to moderate arteriosclerosis  UPC: 0.157 (12/22)  WBC/ANC:  wnl    Plan: Will maintain current immunosuppression. Continue to monitor.    OI Prophylaxis:   CMV Status: D+/ R+, moderate risk . CMV prophylaxis: valganciclovir 450 mg daily x 3 months per protocol.  Estimated Creatinine Clearance: 40.1 mL/min (A) (based on SCr of 1.8 mg/dL (H)).  No results found for: CMVCP  PCP Prophylaxis: bactrim SS 1 tab MWF x 6 months.  Thrush: completed in hospital Plan: Continue per protocol. Adjust Valcyte to every other day if renal functinon worsens. Continue to monitor.    CV Prophylaxis: asa 81 mg   The 10-year ASCVD risk score Denman George DC Jr., et al., 2013) is: 45%  Statin therapy: Indicated; currently on pravastatin 20 mg  Plan: start atorvastatin 40 mg daily and stop pravastatin. Continue to monitor     BP: Goal < 140/90. Clinic vitals reported above  Home BP ranges: 140-160/70-80  Current meds include: amlodipine 10 mg daily; carvedilol 6.25 mg BID  Plan: Increase carvedilol to 12.5 mg BID. Continue to  monitor     Anemia of CKD:  H/H:   Lab Results   Component Value Date    HGB 11.5 (L) 08/26/2019     Lab Results   Component Value Date    HCT 38.2 (L) 08/26/2019     Iron panel:  Lab Results   Component Value Date    IRON 92 06/25/2019    TIBC 136.1 (L) 06/25/2019    FERRITIN 1,820.0 (H) 04/20/2016     Lab Results   Component Value Date    Iron Saturation (%) 68 (H) 06/25/2019    Iron Saturation (%) 28 12/25/2011       Prior ESA use: none post txp  Plan: stable. Continue to monitor.     DM:   Lab Results   Component Value Date    A1C 6.2 (H) 06/21/2019   . Goal A1c < 7  History of Dm? Yes: T2DM pre transplant that was diet controlled  Established with endocrinologist/PCP for BG managment? No  Currently on: metformin 1000 mg XR daily  Home BS log: 125-160 in past 2 weeks  Diet: excellent appetite  Exercise:not yet  Fluid intake: 4-5 bottles daily  Plan:  Start Rybelsus 3 mg PO daily; increase to 7 mg after 30 days. Plan pending insurance approval. Increase or adjust metformin as renal function changes.     Electrolytes: K 5.7, Mg 1.1, Ca 10.6  Meds currently on: mg plus protein 266 mg TID  Plan: Increase Mg plus protein to 3 tabs (399 mg) TID.  If calcium increases he still has Sensipar 30 mg daily at home and tolerated well pre transplant.  Counseled on low K+, high Mg diet. Monitor K. Continue to monitor      GI/BM: pt reports no complaints Meds currently on: docusate PRN (using occasionally), Miralax PRN (not using), omeprazole 20 mg daily  Plan: Change PPI to QOD. Stop at next visit if tolerated. Continue to monitor    Pain: pt reports mild incisional site pain  Meds currently on: APAP PRN (not using)  Plan: Continue to monitor    Bone health:   Vitamin D Level: 12.3 on 06/30/19. Goal > 30.   Last DEXA results:  none available  Current meds include: none  Plan: Vitamin D level  out of goal.  Start ergocalciferol 50,000 weekly for 8 weeks. Continue to monitor.     Women's/Men's Health:  Frank Leach is a 69 y.o. male. Patient reports no men's/women's health issues  Plan: Continue to monitor    Gout - last gout flare >2 years ago  Meds currently on: allopurinol 100 mg daily  Plan: continue to monitor    Erectile dysfunction  Meds currently on: none  Plan: start sildenafil 50 mg PRN    Pharmacy preference:  [facility]    Adherence: Patient has poor understanding of medications; was not able to independently identify names/doses of immunosuppressants and OI meds.  Patient  does not fill their own pill box on a regular basis at home.  His wife manages meds  Patient brought medication card:yes  Pill box:was correct  Plan: Encouraged patient to start to learn meds and eventually help with pill box fill; provided moderate adherence counseling/intervention    Spent approximately 30 minutes on educating this patient and greater than 50% was spent in direct face to face counseling regarding post transplant medication education. Questions and concerns were address to patient's satisfaction.    Patient was reviewed with Darolyn Rua, AGNP who  was agreement with the stated plan:     During this visit, the following was completed:   BG log data assessment  BP log data assessment  Labs ordered and evaluated  complex treatment plan >1 DS   Patient education was completed for 11-24 minutes     All questions/concerns were addressed to the patient's satisfaction. __________________________________________  Phillis Haggis, PharmD    Cleone Slim, PHARMD, CPP  [facility] [employer] TRANSPLANT CLINICAL PHARMACIST PRACTITIONER  PAGER [phone]

## 2019-08-27 NOTE — Unmapped (Signed)
Spoke to [name] and scheduled non specialty meds for delivery. He ok'd $141 price for Rybelsus.  Delivery set for 08/28/19.

## 2019-08-28 ENCOUNTER — Encounter: Admit: 2019-08-28 | Discharge: 2019-08-29 | Payer: MEDICARE

## 2019-08-28 LAB — MAGNESIUM: Magnesium:MCnc:Pt:Ser/Plas:Qn:: 1.4 — ABNORMAL LOW

## 2019-08-28 LAB — RENAL FUNCTION PANEL
ALBUMIN: 4.4 g/dL (ref 3.5–5.0)
ANION GAP: 10 mmol/L (ref 7–15)
BLOOD UREA NITROGEN: 29 mg/dL — ABNORMAL HIGH (ref 7–21)
BUN / CREAT RATIO: 15
CALCIUM: 10.5 mg/dL — ABNORMAL HIGH (ref 8.5–10.2)
CO2: 23 mmol/L (ref 22.0–30.0)
CREATININE: 1.9 mg/dL — ABNORMAL HIGH (ref 0.70–1.30)
EGFR CKD-EPI AA MALE: 42 mL/min/{1.73_m2} — ABNORMAL LOW (ref >=60)
EGFR CKD-EPI NON-AA MALE: 36 mL/min/{1.73_m2} — ABNORMAL LOW (ref >=60)
GLUCOSE RANDOM: 128 mg/dL (ref 70–179)
PHOSPHORUS: 3 mg/dL (ref 2.9–4.7)
POTASSIUM: 5.7 mmol/L — ABNORMAL HIGH (ref 3.5–5.0)
SODIUM: 143 mmol/L (ref 135–145)

## 2019-08-28 LAB — CBC W/ AUTO DIFF
BASOPHILS ABSOLUTE COUNT: 0 10*9/L (ref 0.0–0.1)
BASOPHILS RELATIVE PERCENT: 0.5 %
EOSINOPHILS ABSOLUTE COUNT: 0.1 10*9/L (ref 0.0–0.7)
EOSINOPHILS RELATIVE PERCENT: 4.1 %
HEMATOCRIT: 34.2 % — ABNORMAL LOW (ref 38.0–50.0)
LYMPHOCYTES ABSOLUTE COUNT: 0.2 10*9/L — ABNORMAL LOW (ref 0.7–4.0)
LYMPHOCYTES RELATIVE PERCENT: 6.7 %
MEAN CORPUSCULAR HEMOGLOBIN CONC: 32.6 g/dL (ref 30.0–36.0)
MEAN CORPUSCULAR HEMOGLOBIN: 31 pg (ref 26.0–34.0)
MEAN CORPUSCULAR VOLUME: 95.1 fL — ABNORMAL HIGH (ref 81.0–95.0)
MEAN PLATELET VOLUME: 7.5 fL (ref 7.0–10.0)
MONOCYTES RELATIVE PERCENT: 5.7 %
NEUTROPHILS ABSOLUTE COUNT: 2.4 10*9/L (ref 1.7–7.7)
NEUTROPHILS RELATIVE PERCENT: 83 %
PLATELET COUNT: 283 10*9/L (ref 150–450)
RED BLOOD CELL COUNT: 3.59 10*12/L — ABNORMAL LOW (ref 4.32–5.72)
RED CELL DISTRIBUTION WIDTH: 13.7 % (ref 12.0–15.0)
WBC ADJUSTED: 2.9 10*9/L — ABNORMAL LOW (ref 3.5–10.5)

## 2019-08-28 LAB — TACROLIMUS BLOOD: Lab: 12

## 2019-08-28 LAB — ANION GAP: Anion gap 3:SCnc:Pt:Ser/Plas:Qn:: 10

## 2019-08-28 LAB — MEAN CORPUSCULAR VOLUME: Erythrocyte mean corpuscular volume:EntVol:Pt:RBC:Qn:Automated count: 95.1 — ABNORMAL HIGH

## 2019-08-30 MED FILL — ENVARSUS XR 4 MG TABLET,EXTENDED RELEASE: 3 days supply | Qty: 9 | Fill #0

## 2019-08-30 MED FILL — ENVARSUS XR 4 MG TABLET,EXTENDED RELEASE: 3 days supply | Qty: 9 | Fill #0 | Status: AC

## 2019-09-01 ENCOUNTER — Encounter: Admit: 2019-09-01 | Discharge: 2019-09-02 | Payer: MEDICARE

## 2019-09-01 LAB — RENAL FUNCTION PANEL
ALBUMIN: 4.2 g/dL (ref 3.5–5.0)
ANION GAP: 9 mmol/L (ref 7–15)
BLOOD UREA NITROGEN: 27 mg/dL — ABNORMAL HIGH (ref 7–21)
BUN / CREAT RATIO: 14
CALCIUM: 10.4 mg/dL — ABNORMAL HIGH (ref 8.5–10.2)
CO2: 22 mmol/L (ref 22.0–30.0)
CREATININE: 1.91 mg/dL — ABNORMAL HIGH (ref 0.70–1.30)
EGFR CKD-EPI AA MALE: 41 mL/min/{1.73_m2} — ABNORMAL LOW (ref >=60)
EGFR CKD-EPI NON-AA MALE: 36 mL/min/{1.73_m2} — ABNORMAL LOW (ref >=60)
GLUCOSE RANDOM: 154 mg/dL (ref 70–179)
PHOSPHORUS: 3.2 mg/dL (ref 2.9–4.7)
POTASSIUM: 5.8 mmol/L — ABNORMAL HIGH (ref 3.5–5.0)
SODIUM: 143 mmol/L (ref 135–145)

## 2019-09-01 LAB — SMEAR REVIEW

## 2019-09-01 LAB — CBC W/ AUTO DIFF
BASOPHILS ABSOLUTE COUNT: 0.1 10*9/L (ref 0.0–0.1)
BASOPHILS RELATIVE PERCENT: 2.2 %
EOSINOPHILS ABSOLUTE COUNT: 0.1 10*9/L (ref 0.0–0.7)
EOSINOPHILS RELATIVE PERCENT: 5.2 %
HEMATOCRIT: 33.5 % — ABNORMAL LOW (ref 38.0–50.0)
HEMOGLOBIN: 10.9 g/dL — ABNORMAL LOW (ref 13.5–17.5)
LYMPHOCYTES ABSOLUTE COUNT: 0.1 10*9/L — ABNORMAL LOW (ref 0.7–4.0)
LYMPHOCYTES RELATIVE PERCENT: 2.8 %
MEAN CORPUSCULAR HEMOGLOBIN: 31 pg (ref 26.0–34.0)
MEAN CORPUSCULAR VOLUME: 95.3 fL — ABNORMAL HIGH (ref 81.0–95.0)
MONOCYTES ABSOLUTE COUNT: 0.2 10*9/L (ref 0.1–1.0)
NEUTROPHILS ABSOLUTE COUNT: 2.1 10*9/L (ref 1.7–7.7)
NEUTROPHILS RELATIVE PERCENT: 83.2 %
PLATELET COUNT: 289 10*9/L (ref 150–450)
RED BLOOD CELL COUNT: 3.52 10*12/L — ABNORMAL LOW (ref 4.32–5.72)
RED CELL DISTRIBUTION WIDTH: 14.1 % (ref 12.0–15.0)
WBC ADJUSTED: 2.5 10*9/L — ABNORMAL LOW (ref 3.5–10.5)

## 2019-09-01 LAB — POTASSIUM: Potassium:SCnc:Pt:Ser/Plas:Qn:: 5.8 — ABNORMAL HIGH

## 2019-09-01 LAB — MAGNESIUM: Magnesium:MCnc:Pt:Ser/Plas:Qn:: 1.6

## 2019-09-01 LAB — MONOCYTES RELATIVE PERCENT: Monocytes/100 leukocytes:NFr:Pt:Bld:Qn:Automated count: 6.6

## 2019-09-01 LAB — TACROLIMUS BLOOD: Lab: 9.2

## 2019-09-01 MED FILL — CARVEDILOL 6.25 MG TABLET: 90 days supply | Qty: 360 | Fill #0 | Status: AC

## 2019-09-01 NOTE — Unmapped (Signed)
Received 3 days worth of Envarsus from [facility] on 08/30/19.    [facility] Refill Coordination Note    Specialty Medication(s) to be Shipped:   Transplant: Envarsus 4mg     Other medication(s) to be shipped: n/a     [name], DOB: 1951-06-15  Phone: [phone] (home)       All above HIPAA information was verified with patient.     Completed refill call assessment today to schedule patient's medication shipment from the [facility] [phone]).       Specialty medication(s) and dose(s) confirmed: Regimen is correct and unchanged.   Changes to medications: Deamonte reports no changes reported at this time.  Changes to insurance: No  Questions for the pharmacist: No    Confirmed patient received Welcome Packet with first shipment. The patient will receive a drug information handout for each medication shipped and additional FDA Medication Guides as required.       DISEASE/MEDICATION-SPECIFIC INFORMATION        N/A    SPECIALTY MEDICATION ADHERENCE                Envarsus XR 4 mg: 2 days of medicine on hand         SHIPPING     Shipping address confirmed in Epic.     Delivery Scheduled: Yes, Expected medication delivery date: 09/03/19.     Medication will be delivered via Next Day Courier to the prescription address in Epic WAM.    Indigo Chaddock Vangie Bicker   [facility] Specialty Pharmacist

## 2019-09-02 MED FILL — ENVARSUS XR 4 MG TABLET,EXTENDED RELEASE: 30 days supply | Qty: 90 | Fill #0 | Status: AC

## 2019-09-02 MED FILL — ENVARSUS XR 4 MG TABLET,EXTENDED RELEASE: 30 days supply | Qty: 90 | Fill #0

## 2019-09-04 ENCOUNTER — Encounter: Admit: 2019-09-04 | Discharge: 2019-09-05 | Payer: MEDICARE

## 2019-09-04 LAB — CBC W/ AUTO DIFF
BASOPHILS ABSOLUTE COUNT: 0.1 10*9/L (ref 0.0–0.1)
BASOPHILS RELATIVE PERCENT: 3.5 %
EOSINOPHILS RELATIVE PERCENT: 4.6 %
HEMATOCRIT: 33.6 % — ABNORMAL LOW (ref 38.0–50.0)
LYMPHOCYTES ABSOLUTE COUNT: 0.1 10*9/L — ABNORMAL LOW (ref 0.7–4.0)
LYMPHOCYTES RELATIVE PERCENT: 3 %
MEAN CORPUSCULAR HEMOGLOBIN CONC: 32.4 g/dL (ref 30.0–36.0)
MEAN CORPUSCULAR HEMOGLOBIN: 30.6 pg (ref 26.0–34.0)
MEAN CORPUSCULAR VOLUME: 94.5 fL (ref 81.0–95.0)
MEAN PLATELET VOLUME: 7.9 fL (ref 7.0–10.0)
MONOCYTES ABSOLUTE COUNT: 0.1 10*9/L (ref 0.1–1.0)
MONOCYTES RELATIVE PERCENT: 6.1 %
NEUTROPHILS ABSOLUTE COUNT: 2 10*9/L (ref 1.7–7.7)
NEUTROPHILS RELATIVE PERCENT: 82.8 %
PLATELET COUNT: 303 10*9/L (ref 150–450)
RED BLOOD CELL COUNT: 3.56 10*12/L — ABNORMAL LOW (ref 4.32–5.72)
RED CELL DISTRIBUTION WIDTH: 14.1 % (ref 12.0–15.0)
WBC ADJUSTED: 2.4 10*9/L — ABNORMAL LOW (ref 3.5–10.5)

## 2019-09-04 LAB — BUN / CREAT RATIO: Urea nitrogen/Creatinine:MRto:Pt:Ser/Plas:Qn:: 12

## 2019-09-04 LAB — RENAL FUNCTION PANEL
ALBUMIN: 4.1 g/dL (ref 3.5–5.0)
ANION GAP: 8 mmol/L (ref 7–15)
BLOOD UREA NITROGEN: 22 mg/dL — ABNORMAL HIGH (ref 7–21)
BUN / CREAT RATIO: 12
CALCIUM: 10.5 mg/dL — ABNORMAL HIGH (ref 8.5–10.2)
CREATININE: 1.85 mg/dL — ABNORMAL HIGH (ref 0.70–1.30)
EGFR CKD-EPI AA MALE: 43 mL/min/{1.73_m2} — ABNORMAL LOW (ref >=60)
EGFR CKD-EPI NON-AA MALE: 37 mL/min/{1.73_m2} — ABNORMAL LOW (ref >=60)
GLUCOSE RANDOM: 150 mg/dL (ref 70–179)
PHOSPHORUS: 2.8 mg/dL — ABNORMAL LOW (ref 2.9–4.7)
POTASSIUM: 5.7 mmol/L — ABNORMAL HIGH (ref 3.5–5.0)
SODIUM: 142 mmol/L (ref 135–145)

## 2019-09-04 LAB — MAGNESIUM: Magnesium:MCnc:Pt:Ser/Plas:Qn:: 1.5 — ABNORMAL LOW

## 2019-09-04 LAB — TACROLIMUS LEVEL: TACROLIMUS BLOOD: 11.7 ng/mL

## 2019-09-04 LAB — NEUTROPHILS RELATIVE PERCENT: Neutrophils/100 leukocytes:NFr:Pt:Bld:Qn:Automated count: 82.8

## 2019-09-04 LAB — TACROLIMUS BLOOD: Lab: 11.7

## 2019-09-08 ENCOUNTER — Encounter: Admit: 2019-09-08 | Discharge: 2019-09-09 | Payer: MEDICARE

## 2019-09-08 DIAGNOSIS — N529 Male erectile dysfunction, unspecified: Principal | ICD-10-CM

## 2019-09-08 DIAGNOSIS — Z940 Kidney transplant status: Principal | ICD-10-CM

## 2019-09-08 LAB — RENAL FUNCTION PANEL
ANION GAP: 9 mmol/L (ref 7–15)
BLOOD UREA NITROGEN: 25 mg/dL — ABNORMAL HIGH (ref 7–21)
BUN / CREAT RATIO: 16
CALCIUM: 10.4 mg/dL — ABNORMAL HIGH (ref 8.5–10.2)
CHLORIDE: 112 mmol/L — ABNORMAL HIGH (ref 98–107)
CO2: 22 mmol/L (ref 22.0–30.0)
CREATININE: 1.61 mg/dL — ABNORMAL HIGH (ref 0.70–1.30)
EGFR CKD-EPI AA MALE: 51 mL/min/{1.73_m2} — ABNORMAL LOW (ref >=60)
GLUCOSE RANDOM: 140 mg/dL (ref 70–179)
PHOSPHORUS: 3.2 mg/dL (ref 2.9–4.7)
POTASSIUM: 5.2 mmol/L — ABNORMAL HIGH (ref 3.5–5.0)

## 2019-09-08 LAB — TACROLIMUS BLOOD: Lab: 5.7

## 2019-09-08 LAB — CBC W/ AUTO DIFF
BASOPHILS ABSOLUTE COUNT: 0.1 10*9/L (ref 0.0–0.1)
BASOPHILS RELATIVE PERCENT: 2.7 %
EOSINOPHILS ABSOLUTE COUNT: 0.2 10*9/L (ref 0.0–0.7)
EOSINOPHILS RELATIVE PERCENT: 6.7 %
HEMATOCRIT: 32.8 % — ABNORMAL LOW (ref 38.0–50.0)
HEMOGLOBIN: 10.9 g/dL — ABNORMAL LOW (ref 13.5–17.5)
LYMPHOCYTES ABSOLUTE COUNT: 0.1 10*9/L — ABNORMAL LOW (ref 0.7–4.0)
LYMPHOCYTES RELATIVE PERCENT: 2.9 %
MEAN CORPUSCULAR HEMOGLOBIN: 31.2 pg (ref 26.0–34.0)
MEAN PLATELET VOLUME: 7.9 fL (ref 7.0–10.0)
MONOCYTES ABSOLUTE COUNT: 0.2 10*9/L (ref 0.1–1.0)
MONOCYTES RELATIVE PERCENT: 7.7 %
NEUTROPHILS ABSOLUTE COUNT: 1.8 10*9/L (ref 1.7–7.7)
NEUTROPHILS RELATIVE PERCENT: 80 %
RED BLOOD CELL COUNT: 3.49 10*12/L — ABNORMAL LOW (ref 4.32–5.72)
RED CELL DISTRIBUTION WIDTH: 13.6 % (ref 12.0–15.0)
WBC ADJUSTED: 2.2 10*9/L — ABNORMAL LOW (ref 3.5–10.5)

## 2019-09-08 LAB — NEUTROPHILS RELATIVE PERCENT: Neutrophils/100 leukocytes:NFr:Pt:Bld:Qn:Automated count: 80

## 2019-09-08 LAB — MAGNESIUM: Magnesium:MCnc:Pt:Ser/Plas:Qn:: 1.5 — ABNORMAL LOW

## 2019-09-08 LAB — CHLORIDE: Chloride:SCnc:Pt:Ser/Plas:Qn:: 112 — ABNORMAL HIGH

## 2019-09-11 ENCOUNTER — Encounter: Admit: 2019-09-11 | Discharge: 2019-09-12 | Payer: MEDICARE

## 2019-09-11 LAB — SMEAR REVIEW

## 2019-09-11 LAB — RENAL FUNCTION PANEL
ALBUMIN: 4.1 g/dL (ref 3.5–5.0)
ANION GAP: 5 mmol/L — ABNORMAL LOW (ref 7–15)
BLOOD UREA NITROGEN: 26 mg/dL — ABNORMAL HIGH (ref 7–21)
CHLORIDE: 114 mmol/L — ABNORMAL HIGH (ref 98–107)
CO2: 24 mmol/L (ref 22.0–30.0)
CREATININE: 1.77 mg/dL — ABNORMAL HIGH (ref 0.70–1.30)
EGFR CKD-EPI AA MALE: 45 mL/min/{1.73_m2} — ABNORMAL LOW (ref >=60)
EGFR CKD-EPI NON-AA MALE: 39 mL/min/{1.73_m2} — ABNORMAL LOW (ref >=60)
GLUCOSE RANDOM: 135 mg/dL (ref 70–179)
PHOSPHORUS: 3.2 mg/dL (ref 2.9–4.7)
POTASSIUM: 6.1 mmol/L (ref 3.5–5.0)
SODIUM: 143 mmol/L (ref 135–145)

## 2019-09-11 LAB — MAGNESIUM: Magnesium:MCnc:Pt:Ser/Plas:Qn:: 1.5 — ABNORMAL LOW

## 2019-09-11 LAB — CBC W/ AUTO DIFF
BASOPHILS RELATIVE PERCENT: 1.9 %
EOSINOPHILS RELATIVE PERCENT: 7.6 %
HEMATOCRIT: 33.5 % — ABNORMAL LOW (ref 38.0–50.0)
HEMOGLOBIN: 11.1 g/dL — ABNORMAL LOW (ref 13.5–17.5)
LYMPHOCYTES ABSOLUTE COUNT: 0.1 10*9/L — ABNORMAL LOW (ref 0.7–4.0)
LYMPHOCYTES RELATIVE PERCENT: 2.9 %
MEAN CORPUSCULAR HEMOGLOBIN CONC: 33 g/dL (ref 30.0–36.0)
MEAN CORPUSCULAR HEMOGLOBIN: 31 pg (ref 26.0–34.0)
MEAN CORPUSCULAR VOLUME: 94.1 fL (ref 81.0–95.0)
MEAN PLATELET VOLUME: 7.9 fL (ref 7.0–10.0)
MONOCYTES ABSOLUTE COUNT: 0.2 10*9/L (ref 0.1–1.0)
MONOCYTES RELATIVE PERCENT: 7.9 %
NEUTROPHILS ABSOLUTE COUNT: 1.6 10*9/L — ABNORMAL LOW (ref 1.7–7.7)
NEUTROPHILS RELATIVE PERCENT: 79.7 %
PLATELET COUNT: 253 10*9/L (ref 150–450)
RED BLOOD CELL COUNT: 3.56 10*12/L — ABNORMAL LOW (ref 4.32–5.72)
RED CELL DISTRIBUTION WIDTH: 13.5 % (ref 12.0–15.0)

## 2019-09-11 LAB — TACROLIMUS BLOOD: Lab: 7.4

## 2019-09-11 LAB — NEUTROPHILS ABSOLUTE COUNT: Neutrophils:NCnc:Pt:Bld:Qn:Automated count: 1.6 — ABNORMAL LOW

## 2019-09-11 LAB — ANION GAP: Anion gap 3:SCnc:Pt:Ser/Plas:Qn:: 5 — ABNORMAL LOW

## 2019-09-11 MED ORDER — LOKELMA 10 GRAM ORAL POWDER PACKET
PACK | 0 refills | 0 days | Status: CP
Start: 2019-09-11 — End: ?

## 2019-09-11 MED ORDER — SODIUM POLYSTYRENE SULFONATE 15 GRAM-SORBITOL 20 GRAM/60 ML ORAL SUSP
0 refills | 0 days | Status: CP
Start: 2019-09-11 — End: ?
  Filled 2019-09-11: qty 230, 2d supply, fill #0

## 2019-09-11 MED FILL — SPS (WITH SORBITOL) 15 GRAM-20 GRAM/60 ML ORAL SUSPENSION: 2 days supply | Qty: 230 | Fill #0 | Status: AC

## 2019-09-11 NOTE — Unmapped (Signed)
K+ 6.1.  Will give two doses of 30 GM Kayexalate.

## 2019-09-11 NOTE — Unmapped (Signed)
Received page from [facility] lab re: K+ 6.1 Informed post coord, Anne Fu to please f/u  Caryl Ada Inpatient Transplant Nurse Coordinator 09/11/2019 9:22 AM

## 2019-09-12 ENCOUNTER — Ambulatory Visit: Payer: Medicare Other | Attending: Family Medicine

## 2019-09-12 DIAGNOSIS — Z23 Encounter for immunization: Secondary | ICD-10-CM | POA: Insufficient documentation

## 2019-09-12 NOTE — Progress Notes (Signed)
   Covid-19 Vaccination Clinic  Name:  Travis Saunders    MRN: [id number] DOB: 05-23-1951  09/12/2019  [name] was observed post Covid-19 immunization for 15 minutes without incidence. He was provided with Vaccine Information Sheet and instruction to access the V-Safe system.   [name] was instructed to call 911 with any severe reactions post vaccine: Marland Kitchen Difficulty breathing  . Swelling of your face and throat  . A fast heartbeat  . A bad rash all over your body  . Dizziness and weakness    Immunizations Administered    Name Date Dose VIS Date Route   Moderna COVID-19 Vaccine 09/12/2019  3:19 PM 0.5 mL 07/08/2019 Intramuscular   Manufacturer: Moderna   Lot: [id number]   NDC: 53692-230-09

## 2019-09-15 ENCOUNTER — Ambulatory Visit: Admit: 2019-09-15 | Discharge: 2019-09-16 | Payer: MEDICARE

## 2019-09-15 LAB — CBC W/ AUTO DIFF
BASOPHILS ABSOLUTE COUNT: 0.1 10*9/L (ref 0.0–0.1)
BASOPHILS RELATIVE PERCENT: 2.5 %
EOSINOPHILS ABSOLUTE COUNT: 0.2 10*9/L (ref 0.0–0.7)
EOSINOPHILS RELATIVE PERCENT: 5.4 %
HEMATOCRIT: 33.5 % — ABNORMAL LOW (ref 38.0–50.0)
HEMOGLOBIN: 11 g/dL — ABNORMAL LOW (ref 13.5–17.5)
LYMPHOCYTES ABSOLUTE COUNT: 0.1 10*9/L — ABNORMAL LOW (ref 0.7–4.0)
LYMPHOCYTES RELATIVE PERCENT: 1.9 %
MEAN CORPUSCULAR HEMOGLOBIN CONC: 33 g/dL (ref 30.0–36.0)
MEAN CORPUSCULAR HEMOGLOBIN: 30.7 pg (ref 26.0–34.0)
MEAN CORPUSCULAR VOLUME: 93.1 fL (ref 81.0–95.0)
MEAN PLATELET VOLUME: 8.2 fL (ref 7.0–10.0)
MONOCYTES RELATIVE PERCENT: 6.9 %
NEUTROPHILS ABSOLUTE COUNT: 2.6 10*9/L (ref 1.7–7.7)
NEUTROPHILS RELATIVE PERCENT: 83.3 %
PLATELET COUNT: 254 10*9/L (ref 150–450)
RED BLOOD CELL COUNT: 3.6 10*12/L — ABNORMAL LOW (ref 4.32–5.72)
RED CELL DISTRIBUTION WIDTH: 13.8 % (ref 12.0–15.0)

## 2019-09-15 LAB — RENAL FUNCTION PANEL
ALBUMIN: 3.9 g/dL (ref 3.5–5.0)
ANION GAP: 9 mmol/L (ref 7–15)
BLOOD UREA NITROGEN: 26 mg/dL — ABNORMAL HIGH (ref 7–21)
CALCIUM: 10.3 mg/dL — ABNORMAL HIGH (ref 8.5–10.2)
CO2: 25 mmol/L (ref 22.0–30.0)
CREATININE: 1.72 mg/dL — ABNORMAL HIGH (ref 0.70–1.30)
EGFR CKD-EPI AA MALE: 47 mL/min/{1.73_m2} — ABNORMAL LOW (ref >=60)
EGFR CKD-EPI NON-AA MALE: 41 mL/min/{1.73_m2} — ABNORMAL LOW (ref >=60)
GLUCOSE RANDOM: 132 mg/dL (ref 70–179)
PHOSPHORUS: 3.1 mg/dL (ref 2.9–4.7)
POTASSIUM: 5.6 mmol/L — ABNORMAL HIGH (ref 3.5–5.0)
SODIUM: 144 mmol/L (ref 135–145)

## 2019-09-15 LAB — MEAN CORPUSCULAR HEMOGLOBIN CONC: Erythrocyte mean corpuscular hemoglobin concentration:MCnc:Pt:RBC:Qn:Automated count: 33

## 2019-09-15 LAB — MAGNESIUM: Magnesium:MCnc:Pt:Ser/Plas:Qn:: 1.5 — ABNORMAL LOW

## 2019-09-15 LAB — TACROLIMUS BLOOD: Lab: 7.8

## 2019-09-15 LAB — POTASSIUM: Potassium:SCnc:Pt:Ser/Plas:Qn:: 5.6 — ABNORMAL HIGH

## 2019-09-15 NOTE — Unmapped (Signed)
[facility] Refill Coordination Note    Specialty Medication(s) to be Shipped:   Transplant:  mycophenolic acid 180mg  and valgancyclovir 450mg     Other medication(s) to be shipped:   Bactrim  Amlodipine     Frank Leach, DOB: 08/14/1950  Phone: [phone] (home)       All above HIPAA information was verified with patient.     Was a Nurse, learning disability used for this call? No    Completed refill call assessment today to schedule patient's medication shipment from the [facility] [phone]).       Specialty medication(s) and dose(s) confirmed: Regimen is correct and unchanged.   Changes to medications: Frank Leach reports no changes at this time.  Changes to insurance: No  Questions for the pharmacist: No    Confirmed patient received Welcome Packet with first shipment. The patient will receive a drug information handout for each medication shipped and additional FDA Medication Guides as required.       DISEASE/MEDICATION-SPECIFIC INFORMATION        N/A    SPECIALTY MEDICATION ADHERENCE     Medication Adherence    Patient reported X missed doses in the last month: 0      mycophenolic acid 180mg : 7 days worth of medication on hand.    valgancyclovir 450mg : 7 days worth of medication on hand.          SHIPPING     Shipping address confirmed in Epic.     Delivery Scheduled: Yes, Expected medication delivery date: 09/17/19.     Medication will be delivered via UPS to the prescription address in Epic WAM.    Frank Leach   [facility] Specialty Technician

## 2019-09-16 DIAGNOSIS — D899 Disorder involving the immune mechanism, unspecified: Principal | ICD-10-CM

## 2019-09-16 DIAGNOSIS — Z940 Kidney transplant status: Principal | ICD-10-CM

## 2019-09-16 MED FILL — SULFAMETHOXAZOLE 400 MG-TRIMETHOPRIM 80 MG TABLET: ORAL | 28 days supply | Qty: 12 | Fill #2

## 2019-09-16 MED FILL — AMLODIPINE 10 MG TABLET: ORAL | 30 days supply | Qty: 30 | Fill #1

## 2019-09-16 MED FILL — AMLODIPINE 10 MG TABLET: 30 days supply | Qty: 30 | Fill #1 | Status: AC

## 2019-09-16 MED FILL — SULFAMETHOXAZOLE 400 MG-TRIMETHOPRIM 80 MG TABLET: 28 days supply | Qty: 12 | Fill #2 | Status: AC

## 2019-09-16 MED FILL — VALGANCICLOVIR 450 MG TABLET: ORAL | 30 days supply | Qty: 30 | Fill #1

## 2019-09-16 MED FILL — MYCOPHENOLATE SODIUM 180 MG TABLET,DELAYED RELEASE: ORAL | 30 days supply | Qty: 180 | Fill #2

## 2019-09-16 MED FILL — MYCOPHENOLATE SODIUM 180 MG TABLET,DELAYED RELEASE: 30 days supply | Qty: 180 | Fill #2 | Status: AC

## 2019-09-16 MED FILL — VALGANCICLOVIR 450 MG TABLET: 30 days supply | Qty: 30 | Fill #1 | Status: AC

## 2019-09-17 NOTE — Unmapped (Signed)
After reviewing recent labs from 2/8 with Ester Rink, PharmD   Cordelia Poche, AGNP and Wallace Cullens Mincemoyer, CPP    Patient to discontinue Bactrim, d/t chronically elevated Potassium levels   ??  G6PD ordered with next set of labs per pharmacist request.    This covering coordinator called patient to Provide medication change to patient and patient wife, they will remove further Bactrim pills from pillbox. They verbalize understanding.    Note routed to primary TNC    Pt. To get labs at [facility] lab on Thursday- order placed.

## 2019-09-18 ENCOUNTER — Encounter: Admit: 2019-09-18 | Discharge: 2019-09-19 | Payer: MEDICARE

## 2019-09-18 LAB — CBC W/ AUTO DIFF
BASOPHILS ABSOLUTE COUNT: 0 10*9/L (ref 0.0–0.1)
BASOPHILS RELATIVE PERCENT: 0 %
EOSINOPHILS ABSOLUTE COUNT: 0.1 10*9/L (ref 0.0–0.7)
EOSINOPHILS RELATIVE PERCENT: 3.7 %
HEMATOCRIT: 34.1 % — ABNORMAL LOW (ref 38.0–50.0)
HEMOGLOBIN: 11.1 g/dL — ABNORMAL LOW (ref 13.5–17.5)
LYMPHOCYTES ABSOLUTE COUNT: 0.2 10*9/L — ABNORMAL LOW (ref 0.7–4.0)
LYMPHOCYTES RELATIVE PERCENT: 5.8 %
MEAN CORPUSCULAR HEMOGLOBIN CONC: 32.5 g/dL (ref 30.0–36.0)
MEAN CORPUSCULAR HEMOGLOBIN: 30.1 pg (ref 26.0–34.0)
MEAN PLATELET VOLUME: 8.2 fL (ref 7.0–10.0)
MONOCYTES ABSOLUTE COUNT: 0.2 10*9/L (ref 0.1–1.0)
MONOCYTES RELATIVE PERCENT: 5.4 %
NEUTROPHILS ABSOLUTE COUNT: 3.1 10*9/L (ref 1.7–7.7)
PLATELET COUNT: 260 10*9/L (ref 150–450)
RED BLOOD CELL COUNT: 3.68 10*12/L — ABNORMAL LOW (ref 4.32–5.72)
RED CELL DISTRIBUTION WIDTH: 13.6 % (ref 12.0–15.0)
WBC ADJUSTED: 3.7 10*9/L (ref 3.5–10.5)

## 2019-09-18 LAB — BUN / CREAT RATIO: Urea nitrogen/Creatinine:MRto:Pt:Ser/Plas:Qn:: 13

## 2019-09-18 LAB — MAGNESIUM: Magnesium:MCnc:Pt:Ser/Plas:Qn:: 1.5 — ABNORMAL LOW

## 2019-09-18 LAB — RENAL FUNCTION PANEL
ALBUMIN: 3.9 g/dL (ref 3.5–5.0)
ANION GAP: 8 mmol/L (ref 7–15)
BLOOD UREA NITROGEN: 24 mg/dL — ABNORMAL HIGH (ref 7–21)
BUN / CREAT RATIO: 13
CALCIUM: 10.3 mg/dL — ABNORMAL HIGH (ref 8.5–10.2)
CHLORIDE: 111 mmol/L — ABNORMAL HIGH (ref 98–107)
CREATININE: 1.91 mg/dL — ABNORMAL HIGH (ref 0.70–1.30)
EGFR CKD-EPI AA MALE: 41 mL/min/{1.73_m2} — ABNORMAL LOW (ref >=60)
EGFR CKD-EPI NON-AA MALE: 36 mL/min/{1.73_m2} — ABNORMAL LOW (ref >=60)
GLUCOSE RANDOM: 115 mg/dL (ref 70–179)
PHOSPHORUS: 2.8 mg/dL — ABNORMAL LOW (ref 2.9–4.7)
POTASSIUM: 5.5 mmol/L — ABNORMAL HIGH (ref 3.5–5.0)

## 2019-09-18 LAB — TACROLIMUS BLOOD: Lab: 10.3

## 2019-09-18 LAB — SMEAR REVIEW

## 2019-09-18 LAB — GLUCOSE-6-PHOSPHATE DEHYDROGENASE QUAL: Lab: 11.5 — ABNORMAL HIGH

## 2019-09-18 LAB — MEAN CORPUSCULAR HEMOGLOBIN CONC: Erythrocyte mean corpuscular hemoglobin concentration:MCnc:Pt:RBC:Qn:Automated count: 32.5

## 2019-09-22 ENCOUNTER — Encounter: Admit: 2019-09-22 | Discharge: 2019-09-23 | Payer: MEDICARE

## 2019-09-22 LAB — RENAL FUNCTION PANEL
ANION GAP: 10 mmol/L (ref 7–15)
BLOOD UREA NITROGEN: 25 mg/dL — ABNORMAL HIGH (ref 7–21)
BUN / CREAT RATIO: 14
CALCIUM: 10.5 mg/dL — ABNORMAL HIGH (ref 8.5–10.2)
CREATININE: 1.8 mg/dL — ABNORMAL HIGH (ref 0.70–1.30)
EGFR CKD-EPI AA MALE: 44 mL/min/{1.73_m2} — ABNORMAL LOW (ref >=60)
EGFR CKD-EPI NON-AA MALE: 38 mL/min/{1.73_m2} — ABNORMAL LOW (ref >=60)
GLUCOSE RANDOM: 113 mg/dL (ref 70–179)
PHOSPHORUS: 2.8 mg/dL — ABNORMAL LOW (ref 2.9–4.7)
POTASSIUM: 5.4 mmol/L — ABNORMAL HIGH (ref 3.5–5.0)
SODIUM: 144 mmol/L (ref 135–145)

## 2019-09-22 LAB — MAGNESIUM: Magnesium:MCnc:Pt:Ser/Plas:Qn:: 1.4 — ABNORMAL LOW

## 2019-09-22 LAB — CBC W/ AUTO DIFF
BASOPHILS ABSOLUTE COUNT: 0 10*9/L (ref 0.0–0.1)
BASOPHILS RELATIVE PERCENT: 0 %
EOSINOPHILS ABSOLUTE COUNT: 0.2 10*9/L (ref 0.0–0.7)
EOSINOPHILS RELATIVE PERCENT: 3.3 %
HEMOGLOBIN: 11.8 g/dL — ABNORMAL LOW (ref 13.5–17.5)
LYMPHOCYTES ABSOLUTE COUNT: 0.1 10*9/L — ABNORMAL LOW (ref 0.7–4.0)
LYMPHOCYTES RELATIVE PERCENT: 1.5 %
MEAN CORPUSCULAR HEMOGLOBIN CONC: 32.5 g/dL (ref 30.0–36.0)
MEAN CORPUSCULAR HEMOGLOBIN: 30.2 pg (ref 26.0–34.0)
MEAN CORPUSCULAR VOLUME: 92.8 fL (ref 81.0–95.0)
MEAN PLATELET VOLUME: 8 fL (ref 7.0–10.0)
MONOCYTES ABSOLUTE COUNT: 0.2 10*9/L (ref 0.1–1.0)
MONOCYTES RELATIVE PERCENT: 4.5 %
NEUTROPHILS ABSOLUTE COUNT: 4.3 10*9/L (ref 1.7–7.7)
NEUTROPHILS RELATIVE PERCENT: 90.7 %
PLATELET COUNT: 314 10*9/L (ref 150–450)
RED BLOOD CELL COUNT: 3.91 10*12/L — ABNORMAL LOW (ref 4.32–5.72)
WBC ADJUSTED: 4.8 10*9/L (ref 3.5–10.5)

## 2019-09-22 LAB — BASOPHILS ABSOLUTE COUNT: Basophils:NCnc:Pt:Bld:Qn:Automated count: 0

## 2019-09-22 LAB — CO2: Carbon dioxide:SCnc:Pt:Ser/Plas:Qn:: 25

## 2019-09-22 LAB — SMEAR REVIEW

## 2019-09-22 LAB — TACROLIMUS BLOOD: Lab: 10.6

## 2019-09-22 MED FILL — ERGOCALCIFEROL (VITAMIN D2) 1,250 MCG (50,000 UNIT) CAPSULE: 28 days supply | Qty: 4 | Fill #1 | Status: AC

## 2019-09-22 MED FILL — RYBELSUS 3 MG TABLET: 30 days supply | Qty: 30 | Fill #1 | Status: AC

## 2019-09-22 MED FILL — ERGOCALCIFEROL (VITAMIN D2) 1,250 MCG (50,000 UNIT) CAPSULE: ORAL | 28 days supply | Qty: 4 | Fill #1

## 2019-09-22 MED FILL — RYBELSUS 3 MG TABLET: ORAL | 30 days supply | Qty: 30 | Fill #1

## 2019-09-29 ENCOUNTER — Ambulatory Visit: Admit: 2019-09-29 | Discharge: 2019-09-30 | Payer: MEDICARE

## 2019-09-29 LAB — NEUTROPHILS ABSOLUTE COUNT: Neutrophils:NCnc:Pt:Bld:Qn:Automated count: 5.1

## 2019-09-29 LAB — TACROLIMUS BLOOD: Lab: 11.3

## 2019-09-29 LAB — RENAL FUNCTION PANEL
ALBUMIN: 4.2 g/dL (ref 3.5–5.0)
ANION GAP: 12 mmol/L (ref 7–15)
CALCIUM: 10.7 mg/dL — ABNORMAL HIGH (ref 8.5–10.2)
CHLORIDE: 110 mmol/L — ABNORMAL HIGH (ref 98–107)
CO2: 26 mmol/L (ref 22.0–30.0)
CREATININE: 1.78 mg/dL — ABNORMAL HIGH (ref 0.70–1.30)
EGFR CKD-EPI AA MALE: 45 mL/min/{1.73_m2} — ABNORMAL LOW (ref >=60)
EGFR CKD-EPI NON-AA MALE: 39 mL/min/{1.73_m2} — ABNORMAL LOW (ref >=60)
GLUCOSE RANDOM: 112 mg/dL (ref 70–179)
PHOSPHORUS: 2.8 mg/dL — ABNORMAL LOW (ref 2.9–4.7)
POTASSIUM: 5.4 mmol/L — ABNORMAL HIGH (ref 3.5–5.0)
SODIUM: 148 mmol/L — ABNORMAL HIGH (ref 135–145)

## 2019-09-29 LAB — MAGNESIUM
MAGNESIUM: 1.4 mg/dL — ABNORMAL LOW (ref 1.6–2.2)
Magnesium:MCnc:Pt:Ser/Plas:Qn:: 1.4 — ABNORMAL LOW

## 2019-09-29 LAB — CBC W/ AUTO DIFF
BASOPHILS ABSOLUTE COUNT: 0.1 10*9/L (ref 0.0–0.1)
BASOPHILS RELATIVE PERCENT: 1.2 %
EOSINOPHILS ABSOLUTE COUNT: 0.2 10*9/L (ref 0.0–0.7)
EOSINOPHILS RELATIVE PERCENT: 2.8 %
HEMATOCRIT: 36.2 % — ABNORMAL LOW (ref 38.0–50.0)
LYMPHOCYTES ABSOLUTE COUNT: 0 10*9/L — ABNORMAL LOW (ref 0.7–4.0)
LYMPHOCYTES RELATIVE PERCENT: 0.7 %
MEAN CORPUSCULAR HEMOGLOBIN CONC: 32.3 g/dL (ref 30.0–36.0)
MEAN CORPUSCULAR HEMOGLOBIN: 29.9 pg (ref 26.0–34.0)
MEAN CORPUSCULAR VOLUME: 92.5 fL (ref 81.0–95.0)
MEAN PLATELET VOLUME: 8.2 fL (ref 7.0–10.0)
MONOCYTES RELATIVE PERCENT: 5.1 %
NEUTROPHILS ABSOLUTE COUNT: 5.1 10*9/L (ref 1.7–7.7)
NEUTROPHILS RELATIVE PERCENT: 90.2 %
PLATELET COUNT: 279 10*9/L (ref 150–450)
RED BLOOD CELL COUNT: 3.92 10*12/L — ABNORMAL LOW (ref 4.32–5.72)
RED CELL DISTRIBUTION WIDTH: 14.2 % (ref 12.0–15.0)
WBC ADJUSTED: 5.7 10*9/L (ref 3.5–10.5)

## 2019-09-29 LAB — BUN / CREAT RATIO: Urea nitrogen/Creatinine:MRto:Pt:Ser/Plas:Qn:: 12

## 2019-09-29 NOTE — Unmapped (Signed)
[facility] Refill Coordination Note    Specialty Medication(s) to be Shipped:   Transplant: Envarsus 4mg     Other medication(s) to be shipped: n/a     Frank Leach, DOB: [birth year]  Phone: [phone] (home)       All above HIPAA information was verified with patient.     Was a Nurse, learning disability used for this call? No    Completed refill call assessment today to schedule patient's medication shipment from the [facility] [phone]).       Specialty medication(s) and dose(s) confirmed: Regimen is correct and unchanged.   Changes to medications: Frank Leach reports no changes at this time.  Changes to insurance: No  Questions for the pharmacist: No    Confirmed patient received Welcome Packet with first shipment. The patient will receive a drug information handout for each medication shipped and additional FDA Medication Guides as required.       DISEASE/MEDICATION-SPECIFIC INFORMATION        N/A    SPECIALTY MEDICATION ADHERENCE     Medication Adherence    Patient reported X missed doses in the last month: 0  Specialty Medication: Envarsus 4mg   Patient is on additional specialty medications: No  Patient is on more than two specialty medications: No  Any gaps in refill history greater than 2 weeks in the last 3 months: no  Demonstrates understanding of importance of adherence: yes  Informant: patient                Envarsus 4mg : Patient has 5 days of medication on hand      SHIPPING     Shipping address confirmed in Epic.     Delivery Scheduled: Yes, Expected medication delivery date: 2/25.     Medication will be delivered via Same Day Courier to the prescription address in Epic WAM.    [name]   [facility] Specialty Technician

## 2019-10-02 ENCOUNTER — Ambulatory Visit: Admit: 2019-10-02 | Discharge: 2019-10-03 | Payer: MEDICARE

## 2019-10-02 DIAGNOSIS — Z940 Kidney transplant status: Principal | ICD-10-CM

## 2019-10-02 LAB — RENAL FUNCTION PANEL
ANION GAP: 11 mmol/L (ref 7–15)
BLOOD UREA NITROGEN: 23 mg/dL — ABNORMAL HIGH (ref 7–21)
BUN / CREAT RATIO: 12
CALCIUM: 10.5 mg/dL — ABNORMAL HIGH (ref 8.5–10.2)
CHLORIDE: 110 mmol/L — ABNORMAL HIGH (ref 98–107)
CO2: 25 mmol/L (ref 22.0–30.0)
CREATININE: 1.88 mg/dL — ABNORMAL HIGH (ref 0.70–1.30)
EGFR CKD-EPI AA MALE: 42 mL/min/{1.73_m2} — ABNORMAL LOW (ref >=60)
EGFR CKD-EPI NON-AA MALE: 36 mL/min/{1.73_m2} — ABNORMAL LOW (ref >=60)
GLUCOSE RANDOM: 112 mg/dL (ref 70–179)
PHOSPHORUS: 2.8 mg/dL — ABNORMAL LOW (ref 2.9–4.7)
SODIUM: 146 mmol/L — ABNORMAL HIGH (ref 135–145)

## 2019-10-02 LAB — CBC W/ AUTO DIFF
BASOPHILS ABSOLUTE COUNT: 0.1 10*9/L (ref 0.0–0.1)
BASOPHILS RELATIVE PERCENT: 1 %
EOSINOPHILS ABSOLUTE COUNT: 0.2 10*9/L (ref 0.0–0.7)
EOSINOPHILS RELATIVE PERCENT: 2.7 %
HEMATOCRIT: 35.5 % — ABNORMAL LOW (ref 38.0–50.0)
HEMOGLOBIN: 11.3 g/dL — ABNORMAL LOW (ref 13.5–17.5)
LYMPHOCYTES ABSOLUTE COUNT: 0.1 10*9/L — ABNORMAL LOW (ref 0.7–4.0)
LYMPHOCYTES RELATIVE PERCENT: 0.8 %
MEAN CORPUSCULAR VOLUME: 92.1 fL (ref 81.0–95.0)
MEAN PLATELET VOLUME: 8.4 fL (ref 7.0–10.0)
MONOCYTES RELATIVE PERCENT: 7.7 %
NEUTROPHILS ABSOLUTE COUNT: 6.2 10*9/L (ref 1.7–7.7)
NEUTROPHILS RELATIVE PERCENT: 87.8 %
PLATELET COUNT: 235 10*9/L (ref 150–450)
RED BLOOD CELL COUNT: 3.86 10*12/L — ABNORMAL LOW (ref 4.32–5.72)
RED CELL DISTRIBUTION WIDTH: 14 % (ref 12.0–15.0)
WBC ADJUSTED: 7.1 10*9/L (ref 3.5–10.5)

## 2019-10-02 LAB — PHOSPHORUS: Phosphate:MCnc:Pt:Ser/Plas:Qn:: 2.8 — ABNORMAL LOW

## 2019-10-02 LAB — SMEAR REVIEW

## 2019-10-02 LAB — BASOPHILS RELATIVE PERCENT: Basophils/100 leukocytes:NFr:Pt:Bld:Qn:Automated count: 1

## 2019-10-02 LAB — MAGNESIUM: Magnesium:MCnc:Pt:Ser/Plas:Qn:: 1.4 — ABNORMAL LOW

## 2019-10-02 LAB — TACROLIMUS BLOOD: Lab: 9.9

## 2019-10-02 MED ORDER — ENVARSUS XR 1 MG TABLET,EXTENDED RELEASE
ORAL_TABLET | 11 refills | 0 days
Start: 2019-10-02 — End: ?

## 2019-10-02 MED ORDER — ENVARSUS XR 4 MG TABLET,EXTENDED RELEASE
ORAL_TABLET | 11 refills | 0 days | Status: CP
Start: 2019-10-02 — End: ?
  Filled 2019-10-02: qty 90, 30d supply, fill #1

## 2019-10-02 MED FILL — ENVARSUS XR 4 MG TABLET,EXTENDED RELEASE: 30 days supply | Qty: 90 | Fill #1 | Status: AC

## 2019-10-03 DIAGNOSIS — Z940 Kidney transplant status: Principal | ICD-10-CM

## 2019-10-03 MED ORDER — ENVARSUS XR 1 MG TABLET,EXTENDED RELEASE
ORAL_TABLET | Freq: Every day | ORAL | 11 refills | 30 days | Status: CP
Start: 2019-10-03 — End: ?

## 2019-10-03 NOTE — Unmapped (Signed)
Per Dr. Lovena Neighbours, decrease Envarsus to 10 mg

## 2019-10-03 NOTE — Unmapped (Signed)
Change in Envarsus dosage decrease from 12 mg to 10 mg daily. Co-pay $0.00.

## 2019-10-03 NOTE — Unmapped (Signed)
[facility] Clinical Assessment & Refill Coordination Note    Frank Leach, Frank Leach: [birth year]  Phone: [phone] (home)     All above HIPAA information was verified with patient.     Was a Nurse, learning disability used for this call? No    Specialty Medication(s):   Transplant: Envarsus 1mg , Envarsus 4mg ,  mycophenolic acid 180mg  and valgancyclovir 450mg      Current Outpatient Medications   Medication Sig Dispense Refill   ??? acetaminophen (TYLENOL) 500 MG tablet Take 1-2 tablets (500-1,000 mg total) by mouth every six (6) hours as needed for pain or fever (> 38C). (Patient not taking: Reported on 08/26/2019) 100 tablet 0   ??? allopurinoL (ZYLOPRIM) 100 MG tablet Take 1 tablet (100 mg total) by mouth every evening. 30 tablet 11   ??? amLODIPine (NORVASC) 10 MG tablet Take 1 tablet (10 mg total) by mouth daily. 30 tablet 11   ??? aspirin (ECOTRIN) 81 MG tablet Take 1 tablet (81 mg total) by mouth daily. 30 tablet 11   ??? atorvastatin (LIPITOR) 40 MG tablet Take 1 tablet (40 mg total) by mouth daily. 90 tablet 3   ??? carvediloL (COREG) 6.25 MG tablet Take 2 tablets (12.5 mg total) by mouth Two (2) times a day. 360 tablet 3   ??? cinacalcet (SENSIPAR) 60 MG tablet Hold (Patient not taking: Reported on 08/26/2019)     ??? ENVARSUS XR 1 mg Tb24 extended release tablet Take two 4 mg tablets and two 1 mg tablets for total daily dose of 10 mg. 60 tablet 11   ??? ENVARSUS XR 4 mg Tb24 extended release tablet Take 2 tablets (8 mg total) by mouth daily with 2 (1 mg) tablets for a total dose of 10 mg daily. 60 tablet 11   ??? ergocalciferol (DRISDOL) 1,250 mcg (50,000 unit) capsule Take 1 capsule (50,000 Units total) by mouth once a week. 4 capsule 1   ??? magnesium oxide-Mg AA chelate (MAGNESIUM, AMINO ACID CHELATE,) 133 mg Tab Take 3 tablets by mouth Three (3) times a day. 810 tablet 3   ??? metFORMIN (GLUCOPHAGE-XR) 500 MG 24 hr tablet Take 2 tablets (1,000 mg total) by mouth once daily. 180 tablet 3   ??? mycophenolate (MYFORTIC) 180 MG EC tablet Take 3 tablets (540 mg total) by mouth Two (2) times a day. 180 tablet 11   ??? omeprazole (PRILOSEC) 40 MG capsule Take 1 capsule (40 mg total) by mouth every other day. 45 capsule 3   ??? semaglutide 3 mg Tab Take 1 tablet (3 mg) by mouth daily. 90 tablet 3   ??? sildenafiL (VIAGRA) 50 MG tablet Take 1 tablet (50 mg total) by mouth nightly as needed for erectile dysfunction. 20 tablet 1   ??? sodium polystyrene, SPS, with sorbitol (SPS) 15-20 gram/60 mL Susp Take 60 ml (30 grams) by mouth today and 60 ml (30 grams) by mouth tomorrow 230 mL 0   ??? sodium zirconium cyclosilicate (LOKELMA) 10 gram PwPk packet Take three times a day for 2 days 6 packet 0   ??? valGANciclovir (VALCYTE) 450 mg tablet Take 1 tablet (450 mg total) by mouth daily. 30 tablet 1     No current facility-administered medications for this visit.         Changes to medications: see envarsus below    No Known Allergies    Changes to allergies: No    SPECIALTY MEDICATION ADHERENCE     envarsus 1mg   : 7 days of medicine  on hand   envarsus 4mg   : 30 days of medicine on hand   Mycophenolate 180mg   : 21 days of medicine on hand   valganciclovir 450mg   : 21 days of medicine on hand     Medication Adherence    Patient reported X missed doses in the last month: 0  Specialty Medication: envarsus 1mg   Patient is on additional specialty medications: Yes  Additional Specialty Medications: envarsus 4mg   Patient Reported Additional Medication X Missed Doses in the Last Month: 0  Patient is on more than two specialty medications: Yes  Specialty Medication: mycophenolate 180mg   Patient Reported Additional Medication X Missed Doses in the Last Month: 0  Specialty Medication: valganciclovir 450mg   Patient Reported Additional Medication X Missed Doses in the Last Month: 0          Specialty medication(s) dose(s) confirmed: envarsus is now 10mg  daily - pt has 1mg  tablets from a previous fill, requesting new rx today     Are there any concerns with adherence? No    Adherence counseling provided? Not needed    CLINICAL MANAGEMENT AND INTERVENTION      Clinical Benefit Assessment:    Do you feel the medicine is effective or helping your condition? Yes    Clinical Benefit counseling provided? Not needed    Adverse Effects Assessment:    Are you experiencing any side effects? No    Are you experiencing difficulty administering your medicine? No    Quality of Life Assessment:    How many days over the past month did your transplant  keep you from your normal activities? For example, brushing your teeth or getting up in the morning. 0    Have you discussed this with your provider? Not needed    Therapy Appropriateness:    Is therapy appropriate? Yes, therapy is appropriate and should be continued    DISEASE/MEDICATION-SPECIFIC INFORMATION      N/A    PATIENT SPECIFIC NEEDS     ? Does the patient have any physical, cognitive, or cultural barriers? No    ? Is the patient high risk? Yes, patient is taking a REMS drug. Medication is dispensed in compliance with REMS program.     ? Does the patient require a Care Management Plan? No     ? Does the patient require physician intervention or other additional services (i.e. nutrition, smoking cessation, social work)? No      SHIPPING     Specialty Medication(s) to be Shipped:   Transplant: Envarsus 1mg     Other medication(s) to be shipped: na     Changes to insurance: No    Delivery Scheduled: Yes, Expected medication delivery date: 10/07/2019.     Medication will be delivered via Next Day Courier to the confirmed prescription address in [facility].    The patient will receive a drug information handout for each medication shipped and additional FDA Medication Guides as required.  Verified that patient has previously received a Conservation officer, historic buildings.    All of the patient's questions and concerns have been addressed.    Thad Ranger   [facility] Specialty Pharmacist

## 2019-10-06 ENCOUNTER — Encounter: Admit: 2019-10-06 | Discharge: 2019-10-07 | Payer: MEDICARE

## 2019-10-06 LAB — RENAL FUNCTION PANEL
ANION GAP: 10 mmol/L (ref 7–15)
BLOOD UREA NITROGEN: 22 mg/dL — ABNORMAL HIGH (ref 7–21)
BUN / CREAT RATIO: 13
CALCIUM: 10 mg/dL (ref 8.5–10.2)
CHLORIDE: 108 mmol/L — ABNORMAL HIGH (ref 98–107)
CO2: 24 mmol/L (ref 22.0–30.0)
CREATININE: 1.75 mg/dL — ABNORMAL HIGH (ref 0.70–1.30)
EGFR CKD-EPI AA MALE: 46 mL/min/{1.73_m2} — ABNORMAL LOW (ref >=60)
EGFR CKD-EPI NON-AA MALE: 39 mL/min/{1.73_m2} — ABNORMAL LOW (ref >=60)
PHOSPHORUS: 2.6 mg/dL — ABNORMAL LOW (ref 2.9–4.7)
POTASSIUM: 5 mmol/L (ref 3.5–5.0)
SODIUM: 142 mmol/L (ref 135–145)

## 2019-10-06 LAB — CBC W/ AUTO DIFF
BASOPHILS ABSOLUTE COUNT: 0.1 10*9/L (ref 0.0–0.1)
BASOPHILS RELATIVE PERCENT: 1.2 %
EOSINOPHILS ABSOLUTE COUNT: 0.2 10*9/L (ref 0.0–0.7)
EOSINOPHILS RELATIVE PERCENT: 1.6 %
HEMATOCRIT: 33.4 % — ABNORMAL LOW (ref 38.0–50.0)
HEMOGLOBIN: 10.8 g/dL — ABNORMAL LOW (ref 13.5–17.5)
LYMPHOCYTES ABSOLUTE COUNT: 0.1 10*9/L — ABNORMAL LOW (ref 0.7–4.0)
LYMPHOCYTES RELATIVE PERCENT: 0.5 %
MEAN CORPUSCULAR HEMOGLOBIN CONC: 32.4 g/dL (ref 30.0–36.0)
MEAN CORPUSCULAR HEMOGLOBIN: 29.9 pg (ref 26.0–34.0)
MEAN CORPUSCULAR VOLUME: 92.4 fL (ref 81.0–95.0)
MEAN PLATELET VOLUME: 8 fL (ref 7.0–10.0)
MONOCYTES ABSOLUTE COUNT: 0.9 10*9/L (ref 0.1–1.0)
NEUTROPHILS ABSOLUTE COUNT: 10.8 10*9/L — ABNORMAL HIGH (ref 1.7–7.7)
NEUTROPHILS RELATIVE PERCENT: 89 %
PLATELET COUNT: 210 10*9/L (ref 150–450)
RED BLOOD CELL COUNT: 3.62 10*12/L — ABNORMAL LOW (ref 4.32–5.72)
WBC ADJUSTED: 12.1 10*9/L — ABNORMAL HIGH (ref 3.5–10.5)

## 2019-10-06 LAB — BASOPHILS RELATIVE PERCENT: Basophils/100 leukocytes:NFr:Pt:Bld:Qn:Automated count: 1.2

## 2019-10-06 LAB — TACROLIMUS BLOOD: Lab: 5.3

## 2019-10-06 LAB — MAGNESIUM: Magnesium:MCnc:Pt:Ser/Plas:Qn:: 1.2 — ABNORMAL LOW

## 2019-10-06 LAB — CO2: Carbon dioxide:SCnc:Pt:Ser/Plas:Qn:: 24

## 2019-10-06 MED FILL — ENVARSUS XR 1 MG TABLET,EXTENDED RELEASE: 30 days supply | Qty: 60 | Fill #0 | Status: AC

## 2019-10-06 MED FILL — ENVARSUS XR 1 MG TABLET,EXTENDED RELEASE: ORAL | 30 days supply | Qty: 60 | Fill #0

## 2019-10-09 ENCOUNTER — Encounter: Admit: 2019-10-09 | Discharge: 2019-10-10 | Payer: MEDICARE | Attending: Nephrology | Primary: Nephrology

## 2019-10-09 ENCOUNTER — Encounter: Admit: 2019-10-09 | Discharge: 2019-10-10 | Payer: MEDICARE

## 2019-10-09 LAB — URINALYSIS
BILIRUBIN UA: NEGATIVE
BLOOD UA: NEGATIVE
GLUCOSE UA: NEGATIVE
KETONES UA: NEGATIVE
LEUKOCYTE ESTERASE UA: NEGATIVE
NITRITE UA: NEGATIVE
PH UA: 5 (ref 5.0–9.0)
PROTEIN UA: NEGATIVE
RBC UA: 1 /HPF (ref <=3)
SPECIFIC GRAVITY UA: 1.019 (ref 1.003–1.030)
SQUAMOUS EPITHELIAL: 1 /HPF (ref 0–5)
UROBILINOGEN UA: 0.2
WBC UA: 4 /HPF — ABNORMAL HIGH (ref <=2)

## 2019-10-09 LAB — PROTEIN URINE: Protein:MCnc:Pt:Urine:Qn:: 9.7

## 2019-10-09 LAB — PH UA: pH:LsCnc:Pt:Urine:Qn:Test strip: 5

## 2019-10-09 LAB — RENAL FUNCTION PANEL
ALBUMIN: 3.8 g/dL (ref 3.5–5.0)
ANION GAP: 12 mmol/L (ref 7–15)
BLOOD UREA NITROGEN: 26 mg/dL — ABNORMAL HIGH (ref 7–21)
CALCIUM: 10 mg/dL (ref 8.5–10.2)
CHLORIDE: 107 mmol/L (ref 98–107)
CO2: 25 mmol/L (ref 22.0–30.0)
CREATININE: 1.8 mg/dL — ABNORMAL HIGH (ref 0.70–1.30)
EGFR CKD-EPI AA MALE: 44 mL/min/{1.73_m2} — ABNORMAL LOW (ref >=60)
EGFR CKD-EPI NON-AA MALE: 38 mL/min/{1.73_m2} — ABNORMAL LOW (ref >=60)
GLUCOSE RANDOM: 160 mg/dL (ref 70–179)
PHOSPHORUS: 2.8 mg/dL — ABNORMAL LOW (ref 2.9–4.7)
POTASSIUM: 5.1 mmol/L — ABNORMAL HIGH (ref 3.5–5.0)
SODIUM: 144 mmol/L (ref 135–145)

## 2019-10-09 LAB — PHOSPHORUS: Phosphate:MCnc:Pt:Ser/Plas:Qn:: 2.8 — ABNORMAL LOW

## 2019-10-09 LAB — MAGNESIUM: Magnesium:MCnc:Pt:Ser/Plas:Qn:: 1.3 — ABNORMAL LOW

## 2019-10-09 NOTE — Unmapped (Signed)
abTransplant Nephrology Clinic Visit    Subjective/Interval:      Patient not demonstrating any symptoms of drug toxicity.  As of now patient with no acute issues, no new complaints, no fever chills or sweats. no chest pain palpitations orthopnea or shortness of breath. no lightheaded. no lower extremity edema. no abdominal pain no n/v/d. no myalgias or arthralgias. no dysuria hematuria or difficulty voiding.Other ros mostly negative, no hospital admission, no ED visits, no new physicians and no new medicines.    Last dose of Prograf: 9:00am      Assessment:  69 y.o. 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       Recommendations/Plan:     Allograft Function: DDKT KDPI: 42%06/22/2019  Renal function holding relatively stable with electrolytes and acid base balanced.     Baseline Cr: ~ 1.8  Last Cr:  1.7 Date: 10/06/19   Decoy/ BK     neg Date:  08/26/19   DSA     neg Date:   07/28/20  CMV:     neg Date:   08/26/19  UPC:     0.08 Date:    08/26/19     ?? Patient asked to continue with strict k restriction due to hyperkalemia   ?? Will reduce tac goal hopefully helping with lower K   ?? Patient asked to continue drinking more water, labs reflect some hypernatremia   ?? Will start lokelma regular if persistent high above 5.4 region     Immunosuppression Management [High Risk Medical Decision Making For Drug Therapy Requiring Intensive Monitoring For Toxicity]:     Tacro target: 7-9  Tacro dose: 10mg    Tacro last lvl /date: 5.3 ( 10-06-19 )  Last date adjusted: 10/02/19 12>>10   MMF/MPA dose : 540 bid   Wbc/anc count: 14.1 / 10.8  Side effects: none    ?? Been struggling with getting tac lvl regular   ?? Will reduce target to 7-9 for now       Blood Pressure Management:BP well controlled on  Coreg to 12.50 mg BID.      Lipid Management: Last lipid panel on 08/20/2018.  The 10-year ASCVD risk score Denman George DC Jr., et al., 2013) is: 36.3%  Currently taking atorvastatin     Electrolytes: Electrolyte and metabolic parameters in acceptable range.   Magnesium continues to be low normal despite high supplement   continue magnesium supplements to TID.    Ergocalciferol to be continued     Infectious Prophylaxis and Monitoring: CMV VL pending today.  Decoy cells not detected 07/29/19.   BK VL pending today.  The patient finished Valcyte Sep 22 2019  and Bactrim will end 12/20/19 prophylaxis.     Gout: No flares recently.  Continue on Allopurinol.     RCC s/p bilateral nephrectomy:   Last CT RMP 08/29/2018 showed no suspicious enhancing lesions.   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     Health Maintenance:   Colonoscopy: 04/30/2018 - 1 adenomatous polyp; repeat 5-10 years    Immunizations:   Flu Shot: 04/30/2019  Prevnar 13: 10/30/2013  Pneumovax: 04/24/2016,  due 2022  No immunizations till 1 year post transplant.  Covid Vaccine discussed with patient.  He is currently working on getting an appointment scheduled        History of Present Illness    69 y.o. male here for follow up after kidney transplantation.  Patient has ESRD secondary to HTN.  Patient has been on dialysis since 01/25/2012.  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 Patient was admitted for kidney transplant on 06/21/2019.  Patient experienced hyperkalemia that despite medical management would not decrease.  2 hours of HD performed.  Patient discharged 06/26/2019.       Transplant History:    Organ Received: Left DDKT, DBD, PHS, KDPI: 42%; 21h cold ischemia  Native Kidney Disease: HTN; cPRA: 0%  Date of Transplant: 06/22/2019  Post-Transplant Course: HD once for high potassium  Prior Transplants: none Induction: [place]  Date of Ureteral Stent Removal: 07/29/2019  CMV/EBV Status: CMV D+/R+  EBV D+/R+  Rejection Episodes: none  Donor Specific Antibodies: none  Results of Renal Imaging (pre and post):   Pre-Txp 08/29/2018 CT RMP  Sequela of bilateral nephrectomy. Interval decrease in linear soft tissue within the right nephrectomy bed, potentially postsurgical or fat necrosis. Unchanged linear tissue within the left nephrectomy bed. No suspicious enhancing lesions are visualized within the surgical beds    Post-Txp 06/25/2019 (txp kidney only)  The renal transplant was located in the left lower quadrant. Normal size and echogenicity.  No solid masses or calculi. Trace perinephric fluid adjacent to the lower pole of the kidney. Mild pelviectasis  - Perfusion: Using power Doppler, normal perfusion was seen throughout the renal parenchyma.  - Resistive indices in the renal transplant are stable compared with prior examination.  - Main renal artery/iliac artery: Patent. Again noted are 3 renal arteries. Resistive indices within the renal arteries are stable to minimally increased, now at or just above normal limits.  - Main renal vein/iliac vein: Patent    Past Medical History  1. HTN  2. DM II  3. RCC s/p Bilateral nephrectomy (L 04/2016, R 12/2016)  4. GERD  5. HLD  6. 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    Medications  Current Outpatient Medications   Medication Sig Dispense Refill   ??? acetaminophen (TYLENOL) 500 MG tablet Take 1-2 tablets (500-1,000 mg total) by mouth every six (6) hours as needed for pain or fever (> 38C). (Patient not taking: Reported on 08/26/2019) 100 tablet 0   ??? allopurinoL (ZYLOPRIM) 100 MG tablet Take 1 tablet (100 mg total) by mouth every evening. 30 tablet 11   ??? amLODIPine (NORVASC) 10 MG tablet Take 1 tablet (10 mg total) by mouth daily. 30 tablet 11   ??? aspirin (ECOTRIN) 81 MG tablet Take 1 tablet (81 mg total) by mouth daily. 30 tablet 11   ??? atorvastatin (LIPITOR) 40 MG tablet Take 1 tablet (40 mg total) by mouth daily. 90 tablet 3   ??? carvediloL (COREG) 6.25 MG tablet Take 2 tablets (12.5 mg total) by mouth Two (2) times a day. 360 tablet 3   ??? cinacalcet (SENSIPAR) 60 MG tablet Hold (Patient not taking: Reported on 08/26/2019)     ??? ENVARSUS XR 1 mg Tb24 extended release tablet Take 2 tablets (2 mg total) by mouth daily with 2 (4 mg) tablets for a total  dose of 10 mg daily. 60 tablet 11   ??? ENVARSUS XR 4 mg Tb24 extended release tablet Take 2 tablets (8 mg total) by mouth daily with 2 (1 mg) tablets for a total dose of 10 mg daily. 60 tablet 11   ??? ergocalciferol (DRISDOL) 1,250 mcg (50,000 unit) capsule Take 1 capsule (50,000 Units total) by mouth once a week. 4 capsule 1   ??? magnesium oxide-Mg AA chelate (MAGNESIUM, AMINO ACID CHELATE,) 133 mg Tab Take 3 tablets by mouth Three (3) times a day. 810 tablet 3   ??? metFORMIN (GLUCOPHAGE-XR) 500 MG 24 hr tablet Take 2 tablets (1,000 mg total) by mouth once daily. 180 tablet 3   ??? mycophenolate (MYFORTIC) 180 MG EC tablet Take 3 tablets (540 mg total) by mouth Two (2) times a day. 180 tablet 11   ??? omeprazole (PRILOSEC) 40 MG capsule Take 1 capsule (40 mg total) by mouth every other day. 45 capsule 3   ??? semaglutide 3 mg Tab Take 1 tablet (3 mg) by mouth daily. 90 tablet 3   ??? sildenafiL (VIAGRA) 50 MG tablet Take 1 tablet (50 mg total) by mouth nightly as needed for erectile dysfunction. 20 tablet 1   ??? sodium polystyrene, SPS, with sorbitol (SPS) 15-20 gram/60 mL Susp Take 60 ml (30 grams) by mouth today and 60 ml (30 grams) by mouth tomorrow 230 mL 0   ??? sodium zirconium cyclosilicate (LOKELMA) 10 gram PwPk packet Take three times a day for 2 days 6 packet 0   ??? valGANciclovir (VALCYTE) 450 mg tablet Take 1 tablet (450 mg total) by mouth daily. 30 tablet 1     No current facility-administered medications for this visit.            Physical Exam  BP 133/60 (BP Site: R Arm, BP Position: Sitting, BP Cuff Size: Medium)  - Pulse 66  - Temp 35.9 ??C (96.7 ??F) (Temporal)  - Resp 14  - Wt 76.8 kg (169 lb 6.4 oz)  - BMI 27.34 kg/m??   General: no acute distress  HEENT: wearing mask, PERRL  Neck: neck supple, no cervical lymphadenopathy appreciated  CV: normal rate, normal rhythm, no murmur, no gallops, no rubs appreciated  Lungs: clear to auscultation bilaterally  Abdomen: soft, non tender, incision c/d/i; no erythema or edema noted  Extremities:  no edema,   Musculoskeletal: no visible deformity, normal range of motion.  Pulses: intact distally throughout  Neurologic: awake, alert, and oriented x3    Laboratory Data and Imaging reviewed in EPIC    Counseling:  I counseled the patient on:  The need to avoid sun exposure and the use of sunblock while outdoors given the relatively higher risk of skin malignancy in an immunosuppressed state.  The need for adherence to immunosuppression medication.  Patient verbalized understanding.     Follow-Up:  Return to clinic in 4 weeks   Labs: twice a week  Patient will continue to follow-up with his primary care provider for non-transplant related issues and medication refills. We have ordered transplant specific labs per the center's guidelines to monitor and assess for toxicities from immunosuppressant drug therapy

## 2019-10-10 LAB — BURR CELLS

## 2019-10-10 LAB — CBC W/ AUTO DIFF
BASOPHILS ABSOLUTE COUNT: 0.2 10*9/L — ABNORMAL HIGH (ref 0.0–0.1)
BASOPHILS RELATIVE PERCENT: 1.3 %
EOSINOPHILS ABSOLUTE COUNT: 0.8 10*9/L — ABNORMAL HIGH (ref 0.0–0.4)
EOSINOPHILS RELATIVE PERCENT: 5.4 %
HEMATOCRIT: 36.7 % — ABNORMAL LOW (ref 41.0–53.0)
HEMOGLOBIN: 11.2 g/dL — ABNORMAL LOW (ref 13.5–17.5)
LARGE UNSTAINED CELLS: 1 % (ref 0–4)
LYMPHOCYTES RELATIVE PERCENT: 0.8 %
MEAN CORPUSCULAR HEMOGLOBIN CONC: 30.6 g/dL — ABNORMAL LOW (ref 31.0–37.0)
MEAN CORPUSCULAR HEMOGLOBIN: 29.8 pg (ref 26.0–34.0)
MEAN CORPUSCULAR VOLUME: 97.1 fL (ref 80.0–100.0)
MEAN PLATELET VOLUME: 8.1 fL (ref 7.0–10.0)
MONOCYTES ABSOLUTE COUNT: 0.5 10*9/L (ref 0.2–0.8)
MONOCYTES RELATIVE PERCENT: 3.3 %
NEUTROPHILS RELATIVE PERCENT: 88.2 %
PLATELET COUNT: 309 10*9/L (ref 150–440)
RED BLOOD CELL COUNT: 3.78 10*12/L — ABNORMAL LOW (ref 4.50–5.90)
RED CELL DISTRIBUTION WIDTH: 14.8 % (ref 12.0–15.0)
WBC ADJUSTED: 14.1 10*9/L — ABNORMAL HIGH (ref 4.5–11.0)

## 2019-10-10 LAB — TACROLIMUS BLOOD: Lab: 20.1

## 2019-10-10 LAB — WBC ADJUSTED: Leukocytes:NCnc:Pt:Bld:Qn:: 14.1 — ABNORMAL HIGH

## 2019-10-10 LAB — PATHOLOGIST SMEAR INTERPRETATION

## 2019-10-10 NOTE — Unmapped (Signed)
left message, called to schedule 6 week follow up from 3/4 appt with Dr. Lovena Neighbours. Can be scheduled from the Recall tab.

## 2019-10-13 ENCOUNTER — Encounter: Admit: 2019-10-13 | Discharge: 2019-10-14 | Payer: MEDICARE

## 2019-10-13 LAB — RENAL FUNCTION PANEL
ALBUMIN: 3.9 g/dL (ref 3.5–5.0)
ANION GAP: 9 mmol/L (ref 7–15)
BLOOD UREA NITROGEN: 23 mg/dL — ABNORMAL HIGH (ref 7–21)
BUN / CREAT RATIO: 14
CHLORIDE: 108 mmol/L — ABNORMAL HIGH (ref 98–107)
CO2: 26 mmol/L (ref 22.0–30.0)
CREATININE: 1.68 mg/dL — ABNORMAL HIGH (ref 0.70–1.30)
EGFR CKD-EPI AA MALE: 48 mL/min/{1.73_m2} — ABNORMAL LOW (ref >=60)
EGFR CKD-EPI NON-AA MALE: 41 mL/min/{1.73_m2} — ABNORMAL LOW (ref >=60)
GLUCOSE RANDOM: 136 mg/dL (ref 70–179)
PHOSPHORUS: 3.3 mg/dL (ref 2.9–4.7)
POTASSIUM: 5.3 mmol/L — ABNORMAL HIGH (ref 3.5–5.0)
SODIUM: 143 mmol/L (ref 135–145)

## 2019-10-13 LAB — CBC W/ AUTO DIFF
BASOPHILS RELATIVE PERCENT: 0.9 %
EOSINOPHILS ABSOLUTE COUNT: 0.3 10*9/L (ref 0.0–0.7)
EOSINOPHILS RELATIVE PERCENT: 2.3 %
HEMATOCRIT: 34.3 % — ABNORMAL LOW (ref 38.0–50.0)
LYMPHOCYTES ABSOLUTE COUNT: 0.1 10*9/L — ABNORMAL LOW (ref 0.7–4.0)
LYMPHOCYTES RELATIVE PERCENT: 0.6 %
MEAN CORPUSCULAR HEMOGLOBIN CONC: 31.8 g/dL (ref 30.0–36.0)
MEAN CORPUSCULAR HEMOGLOBIN: 28.8 pg (ref 26.0–34.0)
MEAN CORPUSCULAR VOLUME: 90.6 fL (ref 81.0–95.0)
MEAN PLATELET VOLUME: 7.9 fL (ref 7.0–10.0)
MONOCYTES ABSOLUTE COUNT: 0.8 10*9/L (ref 0.1–1.0)
MONOCYTES RELATIVE PERCENT: 5.1 %
NEUTROPHILS ABSOLUTE COUNT: 13.7 10*9/L — ABNORMAL HIGH (ref 1.7–7.7)
NEUTROPHILS RELATIVE PERCENT: 91.1 %
PLATELET COUNT: 321 10*9/L (ref 150–450)
RED BLOOD CELL COUNT: 3.79 10*12/L — ABNORMAL LOW (ref 4.32–5.72)
RED CELL DISTRIBUTION WIDTH: 14.2 % (ref 12.0–15.0)
WBC ADJUSTED: 15 10*9/L — ABNORMAL HIGH (ref 3.5–10.5)

## 2019-10-13 LAB — MAGNESIUM: Magnesium:MCnc:Pt:Ser/Plas:Qn:: 1.4 — ABNORMAL LOW

## 2019-10-13 LAB — TACROLIMUS BLOOD: Lab: 7.7

## 2019-10-13 LAB — MEAN CORPUSCULAR HEMOGLOBIN CONC: Erythrocyte mean corpuscular hemoglobin concentration:MCnc:Pt:RBC:Qn:Automated count: 31.8

## 2019-10-13 LAB — ALBUMIN: Albumin:MCnc:Pt:Ser/Plas:Qn:: 3.9

## 2019-10-14 ENCOUNTER — Ambulatory Visit: Payer: Medicare Other | Attending: Internal Medicine

## 2019-10-14 DIAGNOSIS — Z23 Encounter for immunization: Secondary | ICD-10-CM | POA: Insufficient documentation

## 2019-10-14 NOTE — Progress Notes (Signed)
   Covid-19 Vaccination Clinic  Name:  Travis Saunders    MRN: [id number] DOB: 1950-08-30  10/14/2019  Travis Saunders was observed post Covid-19 immunization for 15 minutes without incident. He was provided with Vaccine Information Sheet and instruction to access the V-Safe system.   Travis Saunders was instructed to call 911 with any severe reactions post vaccine: Marland Kitchen Difficulty breathing  . Swelling of face and throat  . A fast heartbeat  . A bad rash all over body  . Dizziness and weakness   Immunizations Administered    Name Date Dose VIS Date Route   Moderna COVID-19 Vaccine 10/14/2019  2:50 PM 0.5 mL 07/08/2019 Intramuscular   Manufacturer: Moderna   Lot: [id number]   NDC: 47998-721-58

## 2019-10-15 MED ORDER — ERGOCALCIFEROL (VITAMIN D2) 1,250 MCG (50,000 UNIT) CAPSULE
ORAL_CAPSULE | ORAL | 1 refills | 28.00000 days | Status: CP
Start: 2019-10-15 — End: 2020-10-14
  Filled 2019-10-17: qty 4, 28d supply, fill #0

## 2019-10-15 NOTE — Unmapped (Signed)
[facility] Refill Coordination Note    Specialty Medication(s) to be Shipped:   Transplant: mycophenolate mofetil 180mg     Other medication(s) to be shipped: allopurinol 100mg , amlodipine 10mg  and vitamin D (sent rf request)     Frank Leach, DOB: 07/29/1951  Phone: [phone] (home)       All above HIPAA information was verified with patient.     Was a Nurse, learning disability used for this call? No    Completed refill call assessment today to schedule patient's medication shipment from the [facility] [phone]).       Specialty medication(s) and dose(s) confirmed: Regimen is correct and unchanged.   Changes to medications: Frank Leach reports no changes at this time.  Changes to insurance: No  Questions for the pharmacist: No    Confirmed patient received Welcome Packet with first shipment. The patient will receive a drug information handout for each medication shipped and additional FDA Medication Guides as required.       DISEASE/MEDICATION-SPECIFIC INFORMATION        N/A    SPECIALTY MEDICATION ADHERENCE     Medication Adherence    Patient reported X missed doses in the last month: 0  Specialty Medication: Mycophenolate 180mg   Patient is on additional specialty medications: No          Mycophenolate 180 mg: 7 days of medicine on hand     SHIPPING     Shipping address confirmed in Epic.     Delivery Scheduled: Yes, Expected medication delivery date: 10/20/2019.     Medication will be delivered via UPS to the prescription address in Epic WAM.    Frank Leach [facility] Specialty Technician

## 2019-10-15 NOTE — Unmapped (Signed)
Patient has requested a medication refill via EPIC

## 2019-10-16 ENCOUNTER — Encounter: Admit: 2019-10-16 | Discharge: 2019-10-17 | Payer: MEDICARE

## 2019-10-16 LAB — CBC W/ AUTO DIFF
BASOPHILS ABSOLUTE COUNT: 0 10*9/L (ref 0.0–0.1)
BASOPHILS RELATIVE PERCENT: 0 %
EOSINOPHILS ABSOLUTE COUNT: 0.5 10*9/L (ref 0.0–0.7)
EOSINOPHILS RELATIVE PERCENT: 3.1 %
HEMATOCRIT: 33.8 % — ABNORMAL LOW (ref 38.0–50.0)
HEMOGLOBIN: 11 g/dL — ABNORMAL LOW (ref 13.5–17.5)
LYMPHOCYTES ABSOLUTE COUNT: 0 10*9/L — ABNORMAL LOW (ref 0.7–4.0)
LYMPHOCYTES RELATIVE PERCENT: 0.1 %
MEAN CORPUSCULAR HEMOGLOBIN CONC: 32.5 g/dL (ref 30.0–36.0)
MEAN CORPUSCULAR HEMOGLOBIN: 29.4 pg (ref 26.0–34.0)
MEAN CORPUSCULAR VOLUME: 90.4 fL (ref 81.0–95.0)
MEAN PLATELET VOLUME: 7.6 fL (ref 7.0–10.0)
MONOCYTES ABSOLUTE COUNT: 0.8 10*9/L (ref 0.1–1.0)
MONOCYTES RELATIVE PERCENT: 5.3 %
NEUTROPHILS ABSOLUTE COUNT: 13.8 10*9/L — ABNORMAL HIGH (ref 1.7–7.7)
PLATELET COUNT: 346 10*9/L (ref 150–450)
RED BLOOD CELL COUNT: 3.74 10*12/L — ABNORMAL LOW (ref 4.32–5.72)
WBC ADJUSTED: 15.1 10*9/L — ABNORMAL HIGH (ref 3.5–10.5)

## 2019-10-16 LAB — RENAL FUNCTION PANEL
ALBUMIN: 3.9 g/dL (ref 3.5–5.0)
ANION GAP: 10 mmol/L (ref 7–15)
BLOOD UREA NITROGEN: 25 mg/dL — ABNORMAL HIGH (ref 7–21)
BUN / CREAT RATIO: 15
CALCIUM: 10.3 mg/dL — ABNORMAL HIGH (ref 8.5–10.2)
CHLORIDE: 109 mmol/L — ABNORMAL HIGH (ref 98–107)
CREATININE: 1.64 mg/dL — ABNORMAL HIGH (ref 0.70–1.30)
EGFR CKD-EPI AA MALE: 49 mL/min/{1.73_m2} — ABNORMAL LOW (ref >=60)
EGFR CKD-EPI NON-AA MALE: 43 mL/min/{1.73_m2} — ABNORMAL LOW (ref >=60)
GLUCOSE RANDOM: 144 mg/dL (ref 70–179)
PHOSPHORUS: 3.2 mg/dL (ref 2.9–4.7)
POTASSIUM: 5.2 mmol/L — ABNORMAL HIGH (ref 3.5–5.0)

## 2019-10-16 LAB — EGFR CKD-EPI NON-AA MALE
Glomerular filtration rate/1.73 sq M.predicted.non black:ArVRat:Pt:Ser/Plas/Bld:Qn:Creatinine-based formula (CKD-EPI): 43 — ABNORMAL LOW

## 2019-10-16 LAB — PLATELET COUNT: Platelets:NCnc:Pt:Bld:Qn:Automated count: 346

## 2019-10-16 LAB — TACROLIMUS BLOOD: Lab: 9.5

## 2019-10-16 LAB — MAGNESIUM: Magnesium:MCnc:Pt:Ser/Plas:Qn:: 1.5 — ABNORMAL LOW

## 2019-10-17 MED FILL — ALLOPURINOL 100 MG TABLET: 30 days supply | Qty: 30 | Fill #0 | Status: AC

## 2019-10-17 MED FILL — ERGOCALCIFEROL (VITAMIN D2) 1,250 MCG (50,000 UNIT) CAPSULE: 28 days supply | Qty: 4 | Fill #0 | Status: AC

## 2019-10-17 MED FILL — ALLOPURINOL 100 MG TABLET: ORAL | 30 days supply | Qty: 30 | Fill #0

## 2019-10-17 MED FILL — AMLODIPINE 10 MG TABLET: ORAL | 30 days supply | Qty: 30 | Fill #2

## 2019-10-17 MED FILL — MYCOPHENOLATE SODIUM 180 MG TABLET,DELAYED RELEASE: 30 days supply | Qty: 180 | Fill #3 | Status: AC

## 2019-10-17 MED FILL — MYCOPHENOLATE SODIUM 180 MG TABLET,DELAYED RELEASE: ORAL | 30 days supply | Qty: 180 | Fill #3

## 2019-10-17 MED FILL — AMLODIPINE 10 MG TABLET: 30 days supply | Qty: 30 | Fill #2 | Status: AC

## 2019-10-20 ENCOUNTER — Encounter: Admit: 2019-10-20 | Discharge: 2019-10-21 | Payer: MEDICARE

## 2019-10-20 LAB — RENAL FUNCTION PANEL
ALBUMIN: 4 g/dL (ref 3.5–5.0)
ANION GAP: 12 mmol/L (ref 7–15)
BLOOD UREA NITROGEN: 26 mg/dL — ABNORMAL HIGH (ref 7–21)
BUN / CREAT RATIO: 16
CALCIUM: 10.3 mg/dL — ABNORMAL HIGH (ref 8.5–10.2)
CHLORIDE: 110 mmol/L — ABNORMAL HIGH (ref 98–107)
CO2: 24 mmol/L (ref 22.0–30.0)
CREATININE: 1.62 mg/dL — ABNORMAL HIGH (ref 0.70–1.30)
EGFR CKD-EPI NON-AA MALE: 43 mL/min/{1.73_m2} — ABNORMAL LOW (ref >=60)
GLUCOSE RANDOM: 121 mg/dL (ref 70–179)
PHOSPHORUS: 3.3 mg/dL (ref 2.9–4.7)
POTASSIUM: 5.2 mmol/L — ABNORMAL HIGH (ref 3.5–5.0)
SODIUM: 146 mmol/L — ABNORMAL HIGH (ref 135–145)

## 2019-10-20 LAB — CBC W/ AUTO DIFF
BASOPHILS ABSOLUTE COUNT: 0.2 10*9/L — ABNORMAL HIGH (ref 0.0–0.1)
BASOPHILS RELATIVE PERCENT: 1.5 %
EOSINOPHILS ABSOLUTE COUNT: 0.5 10*9/L (ref 0.0–0.7)
HEMATOCRIT: 34.4 % — ABNORMAL LOW (ref 38.0–50.0)
HEMOGLOBIN: 11.2 g/dL — ABNORMAL LOW (ref 13.5–17.5)
LYMPHOCYTES ABSOLUTE COUNT: 0.1 10*9/L — ABNORMAL LOW (ref 0.7–4.0)
LYMPHOCYTES RELATIVE PERCENT: 0.7 %
MEAN CORPUSCULAR HEMOGLOBIN CONC: 32.7 g/dL (ref 30.0–36.0)
MEAN CORPUSCULAR HEMOGLOBIN: 29.5 pg (ref 26.0–34.0)
MEAN CORPUSCULAR VOLUME: 90.5 fL (ref 81.0–95.0)
MEAN PLATELET VOLUME: 8.1 fL (ref 7.0–10.0)
MONOCYTES ABSOLUTE COUNT: 0.6 10*9/L (ref 0.1–1.0)
MONOCYTES RELATIVE PERCENT: 4.3 %
NEUTROPHILS ABSOLUTE COUNT: 11.8 10*9/L — ABNORMAL HIGH (ref 1.7–7.7)
NEUTROPHILS RELATIVE PERCENT: 89.8 %
NUCLEATED RED BLOOD CELLS: 0 /100{WBCs} (ref <=4)
PLATELET COUNT: 339 10*9/L (ref 150–450)
RED CELL DISTRIBUTION WIDTH: 14.1 % (ref 12.0–15.0)
WBC ADJUSTED: 13.2 10*9/L — ABNORMAL HIGH (ref 3.5–10.5)

## 2019-10-20 LAB — MAGNESIUM
MAGNESIUM: 1.6 mg/dL (ref 1.6–2.2)
Magnesium:MCnc:Pt:Ser/Plas:Qn:: 1.6

## 2019-10-20 LAB — SMEAR REVIEW

## 2019-10-20 LAB — CHLORIDE: Chloride:SCnc:Pt:Ser/Plas:Qn:: 110 — ABNORMAL HIGH

## 2019-10-20 LAB — MONOCYTES RELATIVE PERCENT: Monocytes/100 leukocytes:NFr:Pt:Bld:Qn:Automated count: 4.3

## 2019-10-20 LAB — TACROLIMUS BLOOD: Lab: 8.1

## 2019-10-23 ENCOUNTER — Encounter: Admit: 2019-10-23 | Discharge: 2019-10-24 | Payer: MEDICARE

## 2019-10-23 LAB — RENAL FUNCTION PANEL
ALBUMIN: 3.8 g/dL (ref 3.5–5.0)
ANION GAP: 12 mmol/L (ref 7–15)
BLOOD UREA NITROGEN: 24 mg/dL — ABNORMAL HIGH (ref 7–21)
BUN / CREAT RATIO: 15
CALCIUM: 10.3 mg/dL — ABNORMAL HIGH (ref 8.5–10.2)
CO2: 23 mmol/L (ref 22.0–30.0)
CREATININE: 1.58 mg/dL — ABNORMAL HIGH (ref 0.70–1.30)
EGFR CKD-EPI AA MALE: 52 mL/min/{1.73_m2} — ABNORMAL LOW (ref >=60)
EGFR CKD-EPI NON-AA MALE: 45 mL/min/{1.73_m2} — ABNORMAL LOW (ref >=60)
GLUCOSE RANDOM: 131 mg/dL (ref 70–179)
PHOSPHORUS: 3.4 mg/dL (ref 2.9–4.7)
SODIUM: 145 mmol/L (ref 135–145)

## 2019-10-23 LAB — CO2: Carbon dioxide:SCnc:Pt:Ser/Plas:Qn:: 23

## 2019-10-23 LAB — CBC W/ AUTO DIFF
BASOPHILS ABSOLUTE COUNT: 0.1 10*9/L (ref 0.0–0.1)
BASOPHILS RELATIVE PERCENT: 0.7 %
EOSINOPHILS ABSOLUTE COUNT: 0.4 10*9/L (ref 0.0–0.7)
EOSINOPHILS RELATIVE PERCENT: 3.8 %
HEMATOCRIT: 33.8 % — ABNORMAL LOW (ref 38.0–50.0)
HEMOGLOBIN: 10.9 g/dL — ABNORMAL LOW (ref 13.5–17.5)
LYMPHOCYTES ABSOLUTE COUNT: 0 10*9/L — ABNORMAL LOW (ref 0.7–4.0)
LYMPHOCYTES RELATIVE PERCENT: 0.4 %
MEAN CORPUSCULAR HEMOGLOBIN CONC: 32.1 g/dL (ref 30.0–36.0)
MEAN CORPUSCULAR HEMOGLOBIN: 28.9 pg (ref 26.0–34.0)
MEAN CORPUSCULAR VOLUME: 90 fL (ref 81.0–95.0)
MEAN PLATELET VOLUME: 7.3 fL (ref 7.0–10.0)
MONOCYTES RELATIVE PERCENT: 3.8 %
NEUTROPHILS ABSOLUTE COUNT: 10.8 10*9/L — ABNORMAL HIGH (ref 1.7–7.7)
NEUTROPHILS RELATIVE PERCENT: 91.3 %
NUCLEATED RED BLOOD CELLS: 0 /100{WBCs} (ref <=4)
PLATELET COUNT: 284 10*9/L (ref 150–450)
RED BLOOD CELL COUNT: 3.76 10*12/L — ABNORMAL LOW (ref 4.32–5.72)
RED CELL DISTRIBUTION WIDTH: 14.2 % (ref 12.0–15.0)

## 2019-10-23 LAB — MONOCYTES RELATIVE PERCENT: Monocytes/100 leukocytes:NFr:Pt:Bld:Qn:Automated count: 3.8

## 2019-10-23 LAB — TACROLIMUS BLOOD: Lab: 9.5

## 2019-10-23 LAB — MAGNESIUM: Magnesium:MCnc:Pt:Ser/Plas:Qn:: 1.5 — ABNORMAL LOW

## 2019-10-23 NOTE — Unmapped (Signed)
[facility] Refill Coordination Note    Specialty Medication(s) to be Shipped:   Transplant: Envarsus 1mg  and Envarsus 4mg    **DENIED RYBELSUS; one month on hand**    Other medication(s) to be shipped: N/A     Frank Leach, DOB: [birth year]  Phone: [phone] (home)       All above HIPAA information was verified with patient.     Was a Nurse, learning disability used for this call? No    Completed refill call assessment today to schedule patient's medication shipment from the [facility] [phone]).       Specialty medication(s) and dose(s) confirmed: Patient reports changes to the regimen as follows: Envarsus- 10mg  daily **new rx's are on profile**   Changes to medications: Damek reports no changes at this time.  Changes to insurance: No  Questions for the pharmacist: No    Confirmed patient received Welcome Packet with first shipment. The patient will receive a drug information handout for each medication shipped and additional FDA Medication Guides as required.       DISEASE/MEDICATION-SPECIFIC INFORMATION        N/A    SPECIALTY MEDICATION ADHERENCE     Medication Adherence    Patient reported X missed doses in the last month: 0  Specialty Medication: Envarsus 1mg   Patient is on additional specialty medications: Yes  Additional Specialty Medications: Envarsus 4mg   Patient Reported Additional Medication X Missed Doses in the Last Month: 0  Patient is on more than two specialty medications: No          Envarsus 1 mg: 10 days of medicine on hand   Envarsus 4 mg: 10 days of medicine on hand     SHIPPING     Shipping address confirmed in Epic.     Delivery Scheduled: Yes, Expected medication delivery date: 10/31/2019.     Medication will be delivered via Next Day Courier to the prescription address in Epic WAM.    Frank Leach   [facility] Specialty Technician

## 2019-10-27 ENCOUNTER — Encounter: Admit: 2019-10-27 | Discharge: 2019-10-27 | Payer: MEDICARE

## 2019-10-27 DIAGNOSIS — N529 Male erectile dysfunction, unspecified: Principal | ICD-10-CM

## 2019-10-27 DIAGNOSIS — C649 Malignant neoplasm of unspecified kidney, except renal pelvis: Principal | ICD-10-CM

## 2019-10-27 LAB — CHLORIDE: Chloride:SCnc:Pt:Ser/Plas:Qn:: 109 — ABNORMAL HIGH

## 2019-10-27 LAB — RENAL FUNCTION PANEL
ALBUMIN: 4.1 g/dL (ref 3.5–5.0)
ANION GAP: 14 mmol/L (ref 7–15)
BLOOD UREA NITROGEN: 24 mg/dL — ABNORMAL HIGH (ref 7–21)
BUN / CREAT RATIO: 14
CHLORIDE: 109 mmol/L — ABNORMAL HIGH (ref 98–107)
CO2: 22 mmol/L (ref 22.0–30.0)
CREATININE: 1.66 mg/dL — ABNORMAL HIGH (ref 0.70–1.30)
EGFR CKD-EPI AA MALE: 49 mL/min/{1.73_m2} — ABNORMAL LOW (ref >=60)
EGFR CKD-EPI NON-AA MALE: 42 mL/min/{1.73_m2} — ABNORMAL LOW (ref >=60)
GLUCOSE RANDOM: 115 mg/dL (ref 70–179)
POTASSIUM: 5.3 mmol/L — ABNORMAL HIGH (ref 3.5–5.0)
SODIUM: 145 mmol/L (ref 135–145)

## 2019-10-27 LAB — CBC W/ AUTO DIFF
BASOPHILS ABSOLUTE COUNT: 0.1 10*9/L (ref 0.0–0.1)
BASOPHILS RELATIVE PERCENT: 1 %
EOSINOPHILS ABSOLUTE COUNT: 0.6 10*9/L (ref 0.0–0.7)
EOSINOPHILS RELATIVE PERCENT: 5.1 %
HEMATOCRIT: 35.5 % — ABNORMAL LOW (ref 38.0–50.0)
HEMOGLOBIN: 11.5 g/dL — ABNORMAL LOW (ref 13.5–17.5)
LYMPHOCYTES ABSOLUTE COUNT: 0 10*9/L — ABNORMAL LOW (ref 0.7–4.0)
MEAN CORPUSCULAR HEMOGLOBIN CONC: 32.4 g/dL (ref 30.0–36.0)
MEAN CORPUSCULAR HEMOGLOBIN: 29.2 pg (ref 26.0–34.0)
MEAN CORPUSCULAR VOLUME: 90.2 fL (ref 81.0–95.0)
MONOCYTES ABSOLUTE COUNT: 0.4 10*9/L (ref 0.1–1.0)
MONOCYTES RELATIVE PERCENT: 3.9 %
NEUTROPHILS ABSOLUTE COUNT: 9.8 10*9/L — ABNORMAL HIGH (ref 1.7–7.7)
NEUTROPHILS RELATIVE PERCENT: 89.7 %
NUCLEATED RED BLOOD CELLS: 0 /100{WBCs} (ref <=4)
PLATELET COUNT: 258 10*9/L (ref 150–450)
RED BLOOD CELL COUNT: 3.93 10*12/L — ABNORMAL LOW (ref 4.32–5.72)
RED CELL DISTRIBUTION WIDTH: 14.2 % (ref 12.0–15.0)
WBC ADJUSTED: 10.9 10*9/L — ABNORMAL HIGH (ref 3.5–10.5)

## 2019-10-27 LAB — MEAN CORPUSCULAR HEMOGLOBIN: Erythrocyte mean corpuscular hemoglobin:EntMass:Pt:RBC:Qn:Automated count: 29.2

## 2019-10-27 LAB — TACROLIMUS BLOOD: Lab: 8.7

## 2019-10-27 LAB — MAGNESIUM: Magnesium:MCnc:Pt:Ser/Plas:Qn:: 1.4 — ABNORMAL LOW

## 2019-10-27 MED ORDER — PAPAVERINE 16.7 MG/ML PHENTOLAMINE 0.56 MG/ML ALPROSTADIL 5.6 MCG/ML INJECTION
11 refills | 0 days | Status: CP
Start: 2019-10-27 — End: ?

## 2019-10-27 NOTE — Unmapped (Signed)
Assessment:  69yM s/p s/p bilateral nephrectomy (L 04/2016, R 12/2016) for RCC, transplant 06/22/2019 with ED refractory of PDE 5 inhibitors.     Plan:  - trimix injection teaching with nursing  - patient notes he's going to have his wife learn how to inject  - understands to present to the ED for erections lasting more than 2 hours   - continue to follow up with transplant given history of RCC     Reason for Visit:   No chief complaint on file.      HPI:   69 y.o. male  now s/p bilateral nephrectomy (L 04/2016, R 12/2016) for RCC, transplant 06/22/2019. He is being followed by transplant for his history of RCC. Currently being followed closely given recent  1 cm left retrocrural node.     Ed: Notes since transplant he's tried viagra 100 mg for his erections and wasn't able to penetrate. Tried on an empty stomach with stimulation.  Notes that he doesn't think cialis would be effective given viagra wasn't effective. Married, monogamous. Last able to penetrate was > 5 years ago. He doesn't masturbate. No nocturnal erections.     No penile curvature. He has good energy/ libido.     Doing well, denies fevers, chills, nausea, vomiting. Denies CV disease other than hypertension.     Final Diagnosis   A: Kidney mass, right, robotic laparoscopic radical nephrectomy  Renal cell carcinoma, papillary type, see synoptic report below   KIDNEY: Nephrectomy   Kidney Res - All Specimens   SPECIMEN   Procedure  Radical nephrectomy    Specimen Laterality  Right    TUMOR   Tumor Site  Lower pole    Histologic Type  Papillary renal cell carcinoma      Type 2    Histologic Grade (WHO / ISUP Grade)  G2: Nucleoli conspicuous and eosinophilic at 400x magnification, visible but not prominent at 100x magnification    Tumor Size  Greatest dimension in Centimeters (cm): 3.1 Centimeters (cm)   Additional Dimension in Centimeters (cm)  2.7 Centimeters (cm)     2.6 Centimeters (cm)   Tumor Focality  Unifocal    Tumor Extension  Tumor limited to kidney    Sarcomatoid Features  Not identified    Rhabdoid Features  Not identified    Tumor Necrosis  Not identified    Lymphovascular Invasion  Not identified    MARGINS   Margins  Cannot be assessed: Carcinoma extends to focus of capsular disruption, indeterminant for true surgical margin    LYMPH NODES   Regional Lymph Nodes  No lymph nodes submitted or found    PATHOLOGIC STAGE CLASSIFICATION (pTNM, AJCC 8th Edition)   Primary Tumor (pT)  pT1a    Regional Lymph Nodes (pN)  pNX    ADDITIONAL FINDINGS   Pathologic Findings in Nonneoplastic Kidney  Glomerular disease: Diffuse global glomerulosclerosis      Tubulointerstitial disease: Interstitial fibrosis, diffuse tubular atrophy    Additional Pathological Findings  Cyst(s): Benign cortical cysts        Past history reviewed and unchanged    ROS:   A comprehensive 10-system review was negative, except as noted in HPI.    BP 135/70  - Pulse 63  - Temp 36.6 ??C (97.8 ??F) (Temporal)  - Resp 16  - SpO2 97%     Physical Exam:    General: well developed, well nourished, no acute distress  HEENT: PERLA, EOM intact, normocephalic, atraumatic  Neck: supple and  no masses  Chest: symmetrical  Lungs: non-labored breathing  Heart: normal rhythm, no JVD  Abdomen: no tenderness, no masses or hernias, no palpable organomegaly  Extremities: no deformities, no edema, no cyanosis  GU: Circumcised penis with bilaterally descended testicles. No testicular masses or tenderness.  Digital rectal exam refused          CT Chest Wo Contrast  Order: [id number]  Status:  Final result ????Visible to patient:  No (not released) Next appt:  11/27/2019 at 12:30 PM in Transplant Nunzio Cobbs, MD)  Details    Reading Physician Reading Date Result Priority   Marolyn Hammock Scio, MD  [phone] 06/21/2019    [facility] 06/21/2019    Oam A Bhate  [phone] 06/21/2019       Narrative & Impression     EXAM: CT CHEST WO CONTRAST  DATE: 06/21/2019 6:16 PM  ACCESSION: [id number]  DICTATED: 06/21/2019 6:44 PM  INTERPRETATION LOCATION: [facility]  ??  CLINICAL INDICATION: 69 years old Male with Retrocural lymph node    ??  COMPARISON: 08/29/2018 at 04/26/2017 CT abdomen examinations. 06/02/2013 myocardial perfusion exam.  ??  TECHNIQUE: A helical CT scan was obtained without IV contrast from the thoracic inlet through the hemidiaphragms. Images were reconstructed in the axial plane.  Coronal and sagittal reformatted images of the chest were also provided for further evaluation of the lung parenchyma.  ??  FINDINGS:   ??  AIRWAYS, LUNGS, PLEURA: Clear central tracheobronchial tree. Right middle lobe and bibasilar subsegmental atelectasis. No pleural effusion.  ??  MEDIASTINUM: Cardiomegaly. Coronary calcifications. No pericardial effusion. Normal caliber thoracic aorta. No large mediastinal or hilar nodes.  ??  IMAGED ABDOMEN: Left retrocrural 1 x 0.8 cm node (image 78, series 2) unchanged compared to 06/02/2013 cardiac perfusion exam. 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 Right hemidiaphragm elevation.  ??  SOFT TISSUES: Bilateral gynecomastia. No large axillary nodes.  ??  BONES: Renal osteodystrophy.  ??  ??  IMPRESSION:  ??  Unchanged 1 cm left retrocrural node compared to 2014.             Chemistry        Component Value Date/Time    NA 145 10/27/2019 0800    NA 140 04/11/2016 1353    K 5.3 (H) 10/27/2019 0800    K 5.4 (H) 04/11/2016 1353    CL 109 (H) 10/27/2019 0800    CL 95 (L) 05/01/2012 1545    CO2 22.0 10/27/2019 0800    CO2 27 05/01/2012 1545    BUN 24 (H) 10/27/2019 0800    BUN 36 (H) 05/14/2013 0933    CREATININE 1.66 (H) 10/27/2019 0800    CREATININE 9.4 (H) 03/09/2016 0853    GLU 115 10/27/2019 0800        Component Value Date/Time    CALCIUM 10.4 (H) 10/27/2019 0800    CALCIUM 9.8 05/01/2012 1545    ALKPHOS 125 08/26/2019 0849    ALKPHOS 140 (H) 05/14/2013 0933    AST 23 08/26/2019 0849    AST 26 05/14/2013 0933    ALT 15 08/26/2019 0849    ALT 28 05/14/2013 0933    BILITOT 0.5 08/26/2019 0849    BILITOT 0.6 05/14/2013 0933        CT Renal Mass  Status: Final result   PACS Images    ??Show images for CT Renal Mass  CT Renal Mass  Order: [id number]  Status:  Final result ????Visible to patient:  No (not released) Next appt:  11/27/2019 at 12:30 PM in Transplant Nunzio Cobbs, MD) Dx:  Pre-operative cardiovascular examinat...  Details    Reading Physician Reading Date Result Priority   Annice Pih, MD  [phone] 08/29/2018    [facility] 08/29/2018    Toma Deiters, MD  [phone] 08/29/2018       Narrative & Impression     EXAM: CT RENAL MASS (RENAL MASS PROTOCOL)  DATE: 08/29/2018 11:58 AM  ACCESSION: [id number]  DICTATED: 08/29/2018 1:34 PM  INTERPRETATION LOCATION: [facility]  ??  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  COMPARISON: CT abdomen pelvis 04/26/2017.  ??  TECHNIQUE: A spiral CT scan of the abdomen was obtained without IV contrast then with IV contrast in the arterial phase and nephrographic phases. Images were reconstructed in the axial plane. Coronal and sagittal reformatted images were also provided for further evaluation.  ??  FINDINGS:   ??  LINES AND TUBES: None.  ??  LOWER THORAX: Bibasilar linear patchy opacities, likely atelectasis.  ??  HEPATOBILIARY: No suspicious hepatic lesions. The gallbladder is present and otherwise unremarkable. No biliary dilatation.    SPLEEN: Sequela of splenectomy with splenosis.  PANCREAS: Unremarkable.  ??  ADRENALS: Unremarkable.  KIDNEYS/URETERS: Sequela of bilateral nephrectomy. Interval decrease in linear soft tissue within the right nephrectomy bed, potentially postsurgical or fat necrosis. Unchanged linear tissue within the left nephrectomy bed. No suspicious enhancing lesions are visualized within the surgical beds.  ??  GI TRACT: No dilated or thick walled loops of bowel.   ??  PERITONEUM/RETROPERITONEUM AND MESENTERY: No free air or fluid.  LYMPH NODES: Unchanged borderline enlarged 0.8 cm left retrocrural lymph node.  VESSELS: Unremarkable.  ??  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 Focal sclerotic lesion in L3 vertebral body, not markedly changed since 2010. No new osseous lesions. Bilateral gynecomastia.  ??  ??  IMPRESSION:  ??  -Sequela of bilateral nephrectomies with decreasing probable postsurgical changes within the nephrectomy beds. No definite evidence for recurrent or metastatic disease in the abdomen.  ??  - Unchanged 0.8 cm left retrocrural lymph node. Attention is suggested on follow-up.           Results for Marylee Floras (MRN [id number]) as of 10/27/2019 14:18   Ref. Range 04/05/2017 11:27   Prostate Specific Antigen Latest Ref Range: 0.00 - 4.00 ng/mL 2.34

## 2019-10-27 NOTE — Unmapped (Signed)
Penile Injection Therapy for Erectile Dysfunction - Patient Information    Intracavernosal injection therapy (ICI) allows patients who have failed oral medications such as Viagra, Levitra, Cialis and Stendra to achieve an erection. The two drugs most commonly used to attain erections are Caverject (Prostaglandin E1), and Trimix (Prostaglandin E1, Papaverine, and Phentolamine). Caverject is the only FDA approved medication, though most patients choose to use Trimix because it is less expensive. The medication is administered by the patient via a small needle in the penis. Typically, an erection sufficient for intercourse will be achieved in about 10 minutes and will last for less than an hour. Risks include, but are not limited to, scarring, infection, light-headedness, bleeding/bruising and priapism (prolonged erection). If your erection lasts more than 4 hours, you will need to seek immediate medical attention.    Before administering the medication, you will need to learn how to administer the medication safely and effectively, determine the right dose, and understand the side effects. Our Men's Health nurse will demonstrate and teach the proper technique. We will then work with you to find the right dose. It is important that you sign up for MyChart so that you can send [place] information about the dose you administered and your response. We will then write back to you with instructions for your next administration. If you need help signing up for MyChart, please let us know and we will be happy to assist.     A prescription for Trimix will be sent electronically to:  [facility]  [address]  [place], [place] [address]  [phone]  You may receive a phone call from the pharmacy reviewing cost and delivery options. If you do not, please call them.    Trimix formulation: [facility] - Trimix 75/2.5/25 (papaverine 16.7 mg/mL, phentolamine 0.56 mg/ml, alprostadil 5.6 mcg/ml)    First administration  ??? Administer the medication according to self-injection procedure below.  ??? At your teaching visit, we will recommend an initial dose. We typically start at 0.76mL (or 15 units on a 1mL Insulin syringe), but this may vary depending on your response to the medication in the office.  ??? The initial dose is intended to be slightly lower than the dose that you will need to obtain a rigid erection satisfactory for intercourse. You will need to slowly increase the dose of the Trimix until you obtain a satisfactory erection.    Subsequent administrations  ??? If you achieve an erection sufficient for intercourse and the erection lasts less than 1 hour, continue using previous dose.  ??? If your erection was not rigid enough for intercourse or did not last long enough, increase your dose by 5 units.  ??? If your erection lasts longer than 1 hour, decrease dose by 5 units  ??? If your erection lasts longer than 3 hours, go to emergency room.  ??? Do not inject more than 3 times per week  ??? Wait at least 24 hours between injections  ??? Alternate sides of the penis  ??? If you are using 50 units and not getting a sufficient erection, message [place] and we will prescribe a stronger formulation.    If you would like assistance with dosing, please send your provider a MyChart message with the following information:  ??? Dose administered (mL or units)  ??? Erection rigidity (on a scale of 0-10, with 0 being no erection, 6/10 being barely sufficient for intercourse, and 10/10 being best erection ever)  ??? Time from injection  to erection sufficient for intercourse (if applicable)  ??? Time from injection to complete loss of erection    Injection Procedure    Step 1: Set up  ??? Proper hygiene is important. Wash your hands and keep the penis clean.   ??? Assemble the following:   o - Bottle of Trimix   o - Alcohol wipe  o - Syringe   ??? Keep the Trimix cold by returning the bottle to the refrigerator, or by placing the bottle in a cup of ice    Step 2: Prepare the Syringe  ??? Wipe the rubber top of the vial with an alcohol pad.   ??? After removing the cap of the needle, pull the plunger back to the dosage prescribed by your provider, filling this volume with air. Use a new needle and syringe each time.   ??? Insert the needle through the rubber top and inject the air into the vial.   ??? Turn the vial with needle and syringe inserted upside down. Pull back on the syringe plunger in a slow and steady motion until the prescribed dosage is achieved.   ??? Tap the side of the syringe to allow any air bubbles to float towards the needle. Avoid having these air bubbles in the syringe when self-injecting by first injecting out the collected bubbles that may form.   ??? Remove the needle from the bottle and replace the protective cap on the needle.     Step 3: Select and Prepare the Site for Injection  ??? The proper location for injection is at the 9:00 and 3:00 positions, between the base and mid-portion of the penis.(see diagram). Avoid the bottom midline (urethra) and top midline (blood vessels). Avoid any superficial veins or arteries on the surface.   ??? Grasp and pull the head of the penis toward the side of your leg with the index finger and thumb. If right handed, use the left hand. While maintaining light tension, select a site for injection.   ??? Clean the site with an alcohol pad.  ??? Alternate injection sites (left/right and at base/end of shaft)    Step 4: Inject Trimix and Apply Compression  ??? With a steady and continuous motion, penetrate the skin with the needle at a 90 degree angle. Placing the skin at the site of injection on tension reduces pain. The needle should be advanced to the hub. Slight resistance is encountered as the needle passes into the proper position within the erectile tissue (corporal body).   ??? Inject the Trimix over approximately 4 seconds. Withdraw the needle from the penis and apply compression to the injection site for approximately 1 minute. Several minutes of compression may be required to avoid bleeding, especially if you take blood thinners.  ??? Replace the cap on the needle and dispose of properly.   ??? Do not combine Trimix with other medications for erectile dysfunction  (e.g., Viagra, Cialis)    Step 5: Return medicine to refrigerator (once you know what dose works for you, you can pre-load syringes and keep them in the refrigerator).

## 2019-10-27 NOTE — Unmapped (Signed)
Addended by: Nilda Simmer A on: 10/27/2019 03:23 PM     Modules accepted: Orders

## 2019-10-27 NOTE — Unmapped (Signed)
The Pt completed his ICI instruction.  He was shown a Video, given paperwork describing the process and I verbal reviewed the information with him.  I demonstrated the process to him and had him demonstrate the process back to me using normal saline injected into his penis in the proper location. He left the clinic independently without issue.  A message was sent to the provider that this pt is cleared to receive the prescription.     Livia Snellen, RN

## 2019-10-30 ENCOUNTER — Encounter: Admit: 2019-10-30 | Discharge: 2019-10-31 | Payer: MEDICARE

## 2019-10-30 LAB — RENAL FUNCTION PANEL
ALBUMIN: 4 g/dL (ref 3.5–5.0)
ANION GAP: 10 mmol/L (ref 7–15)
BLOOD UREA NITROGEN: 27 mg/dL — ABNORMAL HIGH (ref 7–21)
CHLORIDE: 109 mmol/L — ABNORMAL HIGH (ref 98–107)
CO2: 24 mmol/L (ref 22.0–30.0)
EGFR CKD-EPI AA MALE: 48 mL/min/{1.73_m2} — ABNORMAL LOW (ref >=60)
EGFR CKD-EPI NON-AA MALE: 41 mL/min/{1.73_m2} — ABNORMAL LOW (ref >=60)
GLUCOSE RANDOM: 129 mg/dL (ref 70–179)
PHOSPHORUS: 3.1 mg/dL (ref 2.9–4.7)
POTASSIUM: 5.2 mmol/L — ABNORMAL HIGH (ref 3.5–5.0)
SODIUM: 143 mmol/L (ref 135–145)

## 2019-10-30 LAB — CBC W/ AUTO DIFF
BASOPHILS ABSOLUTE COUNT: 0.2 10*9/L — ABNORMAL HIGH (ref 0.0–0.1)
BASOPHILS RELATIVE PERCENT: 1.5 %
EOSINOPHILS ABSOLUTE COUNT: 0.6 10*9/L (ref 0.0–0.7)
EOSINOPHILS RELATIVE PERCENT: 5.7 %
HEMOGLOBIN: 11.4 g/dL — ABNORMAL LOW (ref 13.5–17.5)
LYMPHOCYTES ABSOLUTE COUNT: 0 10*9/L — ABNORMAL LOW (ref 0.7–4.0)
LYMPHOCYTES RELATIVE PERCENT: 0.3 %
MEAN CORPUSCULAR HEMOGLOBIN: 28.7 pg (ref 26.0–34.0)
MEAN CORPUSCULAR VOLUME: 89.3 fL (ref 81.0–95.0)
MEAN PLATELET VOLUME: 8.2 fL (ref 7.0–10.0)
MONOCYTES ABSOLUTE COUNT: 0.5 10*9/L (ref 0.1–1.0)
MONOCYTES RELATIVE PERCENT: 4.9 %
NEUTROPHILS ABSOLUTE COUNT: 9.1 10*9/L — ABNORMAL HIGH (ref 1.7–7.7)
NEUTROPHILS RELATIVE PERCENT: 87.6 %
PLATELET COUNT: 237 10*9/L (ref 150–450)
RED CELL DISTRIBUTION WIDTH: 14.7 % (ref 12.0–15.0)
WBC ADJUSTED: 10.4 10*9/L (ref 3.5–10.5)

## 2019-10-30 LAB — GIANT PLATELETS

## 2019-10-30 LAB — CALCIUM: Calcium:MCnc:Pt:Ser/Plas:Qn:: 10.5 — ABNORMAL HIGH

## 2019-10-30 LAB — MAGNESIUM: Magnesium:MCnc:Pt:Ser/Plas:Qn:: 1.5 — ABNORMAL LOW

## 2019-10-30 LAB — TACROLIMUS BLOOD: Lab: 9.2

## 2019-10-30 LAB — MONOCYTES RELATIVE PERCENT: Monocytes/100 leukocytes:NFr:Pt:Bld:Qn:Automated count: 4.9

## 2019-10-30 MED FILL — ENVARSUS XR 4 MG TABLET,EXTENDED RELEASE: 30 days supply | Qty: 60 | Fill #0 | Status: AC

## 2019-10-30 MED FILL — ENVARSUS XR 1 MG TABLET,EXTENDED RELEASE: ORAL | 30 days supply | Qty: 60 | Fill #1

## 2019-10-30 MED FILL — ENVARSUS XR 1 MG TABLET,EXTENDED RELEASE: 30 days supply | Qty: 60 | Fill #1 | Status: AC

## 2019-10-30 MED FILL — ENVARSUS XR 4 MG TABLET,EXTENDED RELEASE: ORAL | 30 days supply | Qty: 60 | Fill #0

## 2019-10-30 NOTE — Unmapped (Signed)
Opened in error

## 2019-11-03 ENCOUNTER — Encounter: Admit: 2019-11-03 | Discharge: 2019-11-04 | Payer: MEDICARE

## 2019-11-03 LAB — CBC W/ AUTO DIFF
BASOPHILS ABSOLUTE COUNT: 0.2 10*9/L — ABNORMAL HIGH (ref 0.0–0.1)
EOSINOPHILS ABSOLUTE COUNT: 0.6 10*9/L (ref 0.0–0.7)
EOSINOPHILS RELATIVE PERCENT: 5.5 %
HEMATOCRIT: 36 % — ABNORMAL LOW (ref 38.0–50.0)
HEMOGLOBIN: 11.6 g/dL — ABNORMAL LOW (ref 13.5–17.5)
LYMPHOCYTES ABSOLUTE COUNT: 0.1 10*9/L — ABNORMAL LOW (ref 0.7–4.0)
LYMPHOCYTES RELATIVE PERCENT: 1.3 %
MEAN CORPUSCULAR HEMOGLOBIN CONC: 32.4 g/dL (ref 30.0–36.0)
MEAN CORPUSCULAR HEMOGLOBIN: 29.1 pg (ref 26.0–34.0)
MEAN CORPUSCULAR VOLUME: 89.8 fL (ref 81.0–95.0)
MONOCYTES ABSOLUTE COUNT: 0.5 10*9/L (ref 0.1–1.0)
MONOCYTES RELATIVE PERCENT: 4.7 %
NEUTROPHILS ABSOLUTE COUNT: 8.8 10*9/L — ABNORMAL HIGH (ref 1.7–7.7)
NEUTROPHILS RELATIVE PERCENT: 86.3 %
NUCLEATED RED BLOOD CELLS: 0 /100{WBCs} (ref <=4)
PLATELET COUNT: 246 10*9/L (ref 150–450)
RED BLOOD CELL COUNT: 4 10*12/L — ABNORMAL LOW (ref 4.32–5.72)
RED CELL DISTRIBUTION WIDTH: 14.9 % (ref 12.0–15.0)
WBC ADJUSTED: 10.1 10*9/L (ref 3.5–10.5)

## 2019-11-03 LAB — RENAL FUNCTION PANEL
ALBUMIN: 4.1 g/dL (ref 3.5–5.0)
ANION GAP: 13 mmol/L (ref 7–15)
BLOOD UREA NITROGEN: 27 mg/dL — ABNORMAL HIGH (ref 7–21)
BUN / CREAT RATIO: 17
CALCIUM: 10.6 mg/dL — ABNORMAL HIGH (ref 8.5–10.2)
CHLORIDE: 109 mmol/L — ABNORMAL HIGH (ref 98–107)
CO2: 24 mmol/L (ref 22.0–30.0)
CREATININE: 1.6 mg/dL — ABNORMAL HIGH (ref 0.70–1.30)
EGFR CKD-EPI AA MALE: 51 mL/min/{1.73_m2} — ABNORMAL LOW (ref >=60)
PHOSPHORUS: 3.2 mg/dL (ref 2.9–4.7)
POTASSIUM: 5.2 mmol/L — ABNORMAL HIGH (ref 3.5–5.0)
SODIUM: 146 mmol/L — ABNORMAL HIGH (ref 135–145)

## 2019-11-03 LAB — CO2: Carbon dioxide:SCnc:Pt:Ser/Plas:Qn:: 24

## 2019-11-03 LAB — MAGNESIUM: Magnesium:MCnc:Pt:Ser/Plas:Qn:: 1.5 — ABNORMAL LOW

## 2019-11-03 LAB — TACROLIMUS BLOOD: Lab: 7.8

## 2019-11-03 LAB — LYMPHOCYTES RELATIVE PERCENT: Lymphocytes/100 leukocytes:NFr:Pt:Bld:Qn:Automated count: 1.3

## 2019-11-06 ENCOUNTER — Encounter: Admit: 2019-11-06 | Discharge: 2019-11-07 | Payer: MEDICARE

## 2019-11-06 LAB — CBC W/ AUTO DIFF
BASOPHILS ABSOLUTE COUNT: 0.1 10*9/L (ref 0.0–0.1)
BASOPHILS RELATIVE PERCENT: 1.1 %
EOSINOPHILS ABSOLUTE COUNT: 0.4 10*9/L (ref 0.0–0.7)
EOSINOPHILS RELATIVE PERCENT: 4.3 %
HEMATOCRIT: 34.5 % — ABNORMAL LOW (ref 38.0–50.0)
HEMOGLOBIN: 11 g/dL — ABNORMAL LOW (ref 13.5–17.5)
LYMPHOCYTES ABSOLUTE COUNT: 0.1 10*9/L — ABNORMAL LOW (ref 0.7–4.0)
LYMPHOCYTES RELATIVE PERCENT: 0.7 %
MEAN CORPUSCULAR HEMOGLOBIN: 28.3 pg (ref 26.0–34.0)
MEAN CORPUSCULAR VOLUME: 88.7 fL (ref 81.0–95.0)
MEAN PLATELET VOLUME: 8.1 fL (ref 7.0–10.0)
MONOCYTES ABSOLUTE COUNT: 0.5 10*9/L (ref 0.1–1.0)
MONOCYTES RELATIVE PERCENT: 4.8 %
NEUTROPHILS ABSOLUTE COUNT: 9.1 10*9/L — ABNORMAL HIGH (ref 1.7–7.7)
NEUTROPHILS RELATIVE PERCENT: 89.1 %
NUCLEATED RED BLOOD CELLS: 0 /100{WBCs} (ref <=4)
PLATELET COUNT: 218 10*9/L (ref 150–450)
RED BLOOD CELL COUNT: 3.89 10*12/L — ABNORMAL LOW (ref 4.32–5.72)
RED CELL DISTRIBUTION WIDTH: 14.6 % (ref 12.0–15.0)

## 2019-11-06 LAB — RENAL FUNCTION PANEL
ALBUMIN: 4 g/dL (ref 3.5–5.0)
BLOOD UREA NITROGEN: 27 mg/dL — ABNORMAL HIGH (ref 7–21)
BUN / CREAT RATIO: 16
CALCIUM: 10.3 mg/dL — ABNORMAL HIGH (ref 8.5–10.2)
CHLORIDE: 110 mmol/L — ABNORMAL HIGH (ref 98–107)
CO2: 24 mmol/L (ref 22.0–30.0)
CREATININE: 1.68 mg/dL — ABNORMAL HIGH (ref 0.70–1.30)
EGFR CKD-EPI AA MALE: 48 mL/min/{1.73_m2} — ABNORMAL LOW (ref >=60)
EGFR CKD-EPI NON-AA MALE: 41 mL/min/{1.73_m2} — ABNORMAL LOW (ref >=60)
GLUCOSE RANDOM: 115 mg/dL (ref 70–179)
PHOSPHORUS: 3.3 mg/dL (ref 2.9–4.7)
POTASSIUM: 5.4 mmol/L — ABNORMAL HIGH (ref 3.5–5.0)
SODIUM: 145 mmol/L (ref 135–145)

## 2019-11-06 LAB — BUN / CREAT RATIO: Urea nitrogen/Creatinine:MRto:Pt:Ser/Plas:Qn:: 16

## 2019-11-06 LAB — TACROLIMUS BLOOD: Lab: 8.2

## 2019-11-06 LAB — MAGNESIUM: Magnesium:MCnc:Pt:Ser/Plas:Qn:: 1.6

## 2019-11-06 LAB — SMEAR REVIEW

## 2019-11-06 LAB — MEAN CORPUSCULAR HEMOGLOBIN CONC: Erythrocyte mean corpuscular hemoglobin concentration:MCnc:Pt:RBC:Qn:Automated count: 31.9

## 2019-11-10 ENCOUNTER — Encounter: Admit: 2019-11-10 | Discharge: 2019-11-11 | Payer: MEDICARE

## 2019-11-10 LAB — CBC W/ AUTO DIFF
BASOPHILS ABSOLUTE COUNT: 0.1 10*9/L (ref 0.0–0.1)
BASOPHILS RELATIVE PERCENT: 0.6 %
EOSINOPHILS ABSOLUTE COUNT: 0.5 10*9/L (ref 0.0–0.7)
EOSINOPHILS RELATIVE PERCENT: 4.7 %
HEMATOCRIT: 35.8 % — ABNORMAL LOW (ref 38.0–50.0)
HEMOGLOBIN: 11.2 g/dL — ABNORMAL LOW (ref 13.5–17.5)
LYMPHOCYTES ABSOLUTE COUNT: 0.1 10*9/L — ABNORMAL LOW (ref 0.7–4.0)
LYMPHOCYTES RELATIVE PERCENT: 0.7 %
MEAN CORPUSCULAR HEMOGLOBIN CONC: 31.4 g/dL (ref 30.0–36.0)
MEAN CORPUSCULAR HEMOGLOBIN: 28 pg (ref 26.0–34.0)
MEAN CORPUSCULAR VOLUME: 89.2 fL (ref 81.0–95.0)
MEAN PLATELET VOLUME: 7.9 fL (ref 7.0–10.0)
MONOCYTES ABSOLUTE COUNT: 0.5 10*9/L (ref 0.1–1.0)
MONOCYTES RELATIVE PERCENT: 4.6 %
NEUTROPHILS ABSOLUTE COUNT: 10 10*9/L — ABNORMAL HIGH (ref 1.7–7.7)
NUCLEATED RED BLOOD CELLS: 0 /100{WBCs} (ref <=4)
PLATELET COUNT: 235 10*9/L (ref 150–450)
RED BLOOD CELL COUNT: 4.02 10*12/L — ABNORMAL LOW (ref 4.32–5.72)
RED CELL DISTRIBUTION WIDTH: 15.1 % — ABNORMAL HIGH (ref 12.0–15.0)
WBC ADJUSTED: 11.1 10*9/L — ABNORMAL HIGH (ref 3.5–10.5)

## 2019-11-10 LAB — RENAL FUNCTION PANEL
ALBUMIN: 4.1 g/dL (ref 3.5–5.0)
ANION GAP: 11 mmol/L (ref 7–15)
BLOOD UREA NITROGEN: 26 mg/dL — ABNORMAL HIGH (ref 7–21)
BUN / CREAT RATIO: 15
CALCIUM: 10.4 mg/dL — ABNORMAL HIGH (ref 8.5–10.2)
CHLORIDE: 110 mmol/L — ABNORMAL HIGH (ref 98–107)
CREATININE: 1.76 mg/dL — ABNORMAL HIGH (ref 0.70–1.30)
EGFR CKD-EPI AA MALE: 45 mL/min/{1.73_m2} — ABNORMAL LOW (ref >=60)
EGFR CKD-EPI NON-AA MALE: 39 mL/min/{1.73_m2} — ABNORMAL LOW (ref >=60)
GLUCOSE RANDOM: 116 mg/dL (ref 70–179)
PHOSPHORUS: 3.5 mg/dL (ref 2.9–4.7)
POTASSIUM: 5.3 mmol/L — ABNORMAL HIGH (ref 3.5–5.0)
SODIUM: 144 mmol/L (ref 135–145)

## 2019-11-10 LAB — MAGNESIUM: Magnesium:MCnc:Pt:Ser/Plas:Qn:: 1.7

## 2019-11-10 LAB — TACROLIMUS BLOOD: Lab: 9

## 2019-11-10 LAB — MONOCYTES RELATIVE PERCENT: Monocytes/100 leukocytes:NFr:Pt:Bld:Qn:Automated count: 4.6

## 2019-11-10 LAB — CO2: Carbon dioxide:SCnc:Pt:Ser/Plas:Qn:: 23

## 2019-11-12 NOTE — Unmapped (Signed)
[facility] Refill Coordination Note    Specialty Medication(s) to be Shipped:   Transplant: mycophenolate mofetil 180mg     Other medication(s) to be shipped: allopurinol 100mg , amlodipine 10mg , vitamin D and rybelsus 3mg      Erskine Marylee Floras, DOB: [birth year]  Phone: [phone] (home)       All above HIPAA information was verified with patient.     Was a Nurse, learning disability used for this call? No    Completed refill call assessment today to schedule patient's medication shipment from the [facility] [phone]).       Specialty medication(s) and dose(s) confirmed: Regimen is correct and unchanged.   Changes to medications: Shai reports no changes at this time.  Changes to insurance: No  Questions for the pharmacist: No    Confirmed patient received Welcome Packet with first shipment. The patient will receive a drug information handout for each medication shipped and additional FDA Medication Guides as required.       DISEASE/MEDICATION-SPECIFIC INFORMATION        N/A    SPECIALTY MEDICATION ADHERENCE     Medication Adherence    Patient reported X missed doses in the last month: 0  Specialty Medication: Mycophenolate 180mg   Patient is on additional specialty medications: No          Mycophenolate 180 mg: 5 days of medicine on hand     SHIPPING     Shipping address confirmed in Epic.     Delivery Scheduled: Yes, Expected medication delivery date: 11/14/2019.     Medication will be delivered via Next Day Courier to the prescription address in Epic [facility].    [name]   [facility] Specialty Technician

## 2019-11-13 ENCOUNTER — Encounter: Admit: 2019-11-13 | Discharge: 2019-11-14 | Payer: MEDICARE

## 2019-11-13 LAB — CBC W/ AUTO DIFF
BASOPHILS ABSOLUTE COUNT: 0.1 10*9/L (ref 0.0–0.1)
BASOPHILS RELATIVE PERCENT: 0.8 %
EOSINOPHILS ABSOLUTE COUNT: 0.4 10*9/L (ref 0.0–0.7)
EOSINOPHILS RELATIVE PERCENT: 4.1 %
HEMATOCRIT: 34.4 % — ABNORMAL LOW (ref 38.0–50.0)
HEMOGLOBIN: 11 g/dL — ABNORMAL LOW (ref 13.5–17.5)
LYMPHOCYTES ABSOLUTE COUNT: 0.1 10*9/L — ABNORMAL LOW (ref 0.7–4.0)
LYMPHOCYTES RELATIVE PERCENT: 0.6 %
MEAN CORPUSCULAR HEMOGLOBIN CONC: 31.9 g/dL (ref 30.0–36.0)
MEAN CORPUSCULAR HEMOGLOBIN: 28.3 pg (ref 26.0–34.0)
MEAN CORPUSCULAR VOLUME: 88.8 fL (ref 81.0–95.0)
MEAN PLATELET VOLUME: 8.2 fL (ref 7.0–10.0)
MONOCYTES ABSOLUTE COUNT: 0.5 10*9/L (ref 0.1–1.0)
MONOCYTES RELATIVE PERCENT: 5 %
NEUTROPHILS ABSOLUTE COUNT: 8.7 10*9/L — ABNORMAL HIGH (ref 1.7–7.7)
NEUTROPHILS RELATIVE PERCENT: 89.5 %
NUCLEATED RED BLOOD CELLS: 0 /100{WBCs} (ref <=4)
PLATELET COUNT: 237 10*9/L (ref 150–450)
WBC ADJUSTED: 9.7 10*9/L (ref 3.5–10.5)

## 2019-11-13 LAB — RENAL FUNCTION PANEL
ALBUMIN: 3.8 g/dL (ref 3.5–5.0)
ANION GAP: 10 mmol/L (ref 7–15)
BLOOD UREA NITROGEN: 28 mg/dL — ABNORMAL HIGH (ref 7–21)
CALCIUM: 10.1 mg/dL (ref 8.5–10.2)
CHLORIDE: 110 mmol/L — ABNORMAL HIGH (ref 98–107)
CO2: 23 mmol/L (ref 22.0–30.0)
CREATININE: 1.66 mg/dL — ABNORMAL HIGH (ref 0.70–1.30)
EGFR CKD-EPI NON-AA MALE: 42 mL/min/{1.73_m2} — ABNORMAL LOW (ref >=60)
GLUCOSE RANDOM: 109 mg/dL (ref 70–179)
PHOSPHORUS: 3.2 mg/dL (ref 2.9–4.7)
POTASSIUM: 5.4 mmol/L — ABNORMAL HIGH (ref 3.5–5.0)
SODIUM: 143 mmol/L (ref 135–145)

## 2019-11-13 LAB — RED BLOOD CELL COUNT: Lab: 3.87 — ABNORMAL LOW

## 2019-11-13 LAB — TACROLIMUS BLOOD: Lab: 8.3

## 2019-11-13 LAB — CHLORIDE: Chloride:SCnc:Pt:Ser/Plas:Qn:: 110 — ABNORMAL HIGH

## 2019-11-13 LAB — MAGNESIUM: Magnesium:MCnc:Pt:Ser/Plas:Qn:: 1.5 — ABNORMAL LOW

## 2019-11-13 MED FILL — MYCOPHENOLATE SODIUM 180 MG TABLET,DELAYED RELEASE: 30 days supply | Qty: 180 | Fill #4 | Status: AC

## 2019-11-13 MED FILL — RYBELSUS 3 MG TABLET: 30 days supply | Qty: 30 | Fill #2 | Status: AC

## 2019-11-13 MED FILL — ERGOCALCIFEROL (VITAMIN D2) 1,250 MCG (50,000 UNIT) CAPSULE: 28 days supply | Qty: 4 | Fill #1 | Status: AC

## 2019-11-13 MED FILL — ALLOPURINOL 100 MG TABLET: ORAL | 30 days supply | Qty: 30 | Fill #1

## 2019-11-13 MED FILL — AMLODIPINE 10 MG TABLET: 30 days supply | Qty: 30 | Fill #3 | Status: AC

## 2019-11-13 MED FILL — ERGOCALCIFEROL (VITAMIN D2) 1,250 MCG (50,000 UNIT) CAPSULE: ORAL | 28 days supply | Qty: 4 | Fill #1

## 2019-11-13 MED FILL — AMLODIPINE 10 MG TABLET: ORAL | 30 days supply | Qty: 30 | Fill #3

## 2019-11-13 MED FILL — MYCOPHENOLATE SODIUM 180 MG TABLET,DELAYED RELEASE: ORAL | 30 days supply | Qty: 180 | Fill #4

## 2019-11-13 MED FILL — RYBELSUS 3 MG TABLET: ORAL | 30 days supply | Qty: 30 | Fill #2

## 2019-11-13 MED FILL — ALLOPURINOL 100 MG TABLET: 30 days supply | Qty: 30 | Fill #1 | Status: AC

## 2019-11-17 ENCOUNTER — Encounter: Admit: 2019-11-17 | Discharge: 2019-11-18 | Payer: MEDICARE

## 2019-11-17 LAB — RENAL FUNCTION PANEL
ALBUMIN: 4 g/dL (ref 3.5–5.0)
ANION GAP: 12 mmol/L (ref 7–15)
BLOOD UREA NITROGEN: 29 mg/dL — ABNORMAL HIGH (ref 7–21)
BUN / CREAT RATIO: 17
CALCIUM: 10.3 mg/dL — ABNORMAL HIGH (ref 8.5–10.2)
CHLORIDE: 110 mmol/L — ABNORMAL HIGH (ref 98–107)
CO2: 23 mmol/L (ref 22.0–30.0)
EGFR CKD-EPI AA MALE: 46 mL/min/{1.73_m2} — ABNORMAL LOW (ref >=60)
EGFR CKD-EPI NON-AA MALE: 40 mL/min/{1.73_m2} — ABNORMAL LOW (ref >=60)
GLUCOSE RANDOM: 130 mg/dL (ref 70–179)
PHOSPHORUS: 3.7 mg/dL (ref 2.9–4.7)
POTASSIUM: 5.3 mmol/L — ABNORMAL HIGH (ref 3.5–5.0)
SODIUM: 145 mmol/L (ref 135–145)

## 2019-11-17 LAB — CBC W/ AUTO DIFF
BASOPHILS ABSOLUTE COUNT: 0.1 10*9/L (ref 0.0–0.1)
BASOPHILS RELATIVE PERCENT: 1.1 %
EOSINOPHILS ABSOLUTE COUNT: 0.4 10*9/L (ref 0.0–0.7)
EOSINOPHILS RELATIVE PERCENT: 4.4 %
HEMATOCRIT: 34.6 % — ABNORMAL LOW (ref 38.0–50.0)
HEMOGLOBIN: 11.2 g/dL — ABNORMAL LOW (ref 13.5–17.5)
LYMPHOCYTES ABSOLUTE COUNT: 0 10*9/L — ABNORMAL LOW (ref 0.7–4.0)
LYMPHOCYTES RELATIVE PERCENT: 0.5 %
MEAN CORPUSCULAR HEMOGLOBIN CONC: 32.3 g/dL (ref 30.0–36.0)
MEAN CORPUSCULAR VOLUME: 88.4 fL (ref 81.0–95.0)
MEAN PLATELET VOLUME: 8 fL (ref 7.0–10.0)
MONOCYTES ABSOLUTE COUNT: 0.5 10*9/L (ref 0.1–1.0)
MONOCYTES RELATIVE PERCENT: 5.2 %
NEUTROPHILS RELATIVE PERCENT: 88.8 %
NUCLEATED RED BLOOD CELLS: 0 /100{WBCs} (ref <=4)
PLATELET COUNT: 244 10*9/L (ref 150–450)
RED BLOOD CELL COUNT: 3.91 10*12/L — ABNORMAL LOW (ref 4.32–5.72)
RED CELL DISTRIBUTION WIDTH: 15.2 % — ABNORMAL HIGH (ref 12.0–15.0)
WBC ADJUSTED: 9.8 10*9/L (ref 3.5–10.5)

## 2019-11-17 LAB — SMEAR REVIEW

## 2019-11-17 LAB — MEAN CORPUSCULAR HEMOGLOBIN CONC: Erythrocyte mean corpuscular hemoglobin concentration:MCnc:Pt:RBC:Qn:Automated count: 32.3

## 2019-11-17 LAB — CALCIUM: Calcium:MCnc:Pt:Ser/Plas:Qn:: 10.3 — ABNORMAL HIGH

## 2019-11-17 LAB — MAGNESIUM: Magnesium:MCnc:Pt:Ser/Plas:Qn:: 1.5 — ABNORMAL LOW

## 2019-11-17 LAB — TACROLIMUS BLOOD: Lab: 6.5

## 2019-11-18 NOTE — Unmapped (Signed)
[facility] Clinical Assessment & Refill Coordination Note    Frank Leach, [place]: May 23, 1951  Phone: [phone] (home)     All above HIPAA information was verified with patient.     Was a Nurse, learning disability used for this call? No    Specialty Medication(s):   Transplant: Envarsus 1mg , Envarsus 4mg  and  mycophenolic acid 180mg      Current Outpatient Medications   Medication Sig Dispense Refill   ??? acetaminophen (TYLENOL) 500 MG tablet Take 1-2 tablets (500-1,000 mg total) by mouth every six (6) hours as needed for pain or fever (> 38C). (Patient not taking: Reported on 10/27/2019) 100 tablet 0   ??? allopurinoL (ZYLOPRIM) 100 MG tablet Take 1 tablet (100 mg total) by mouth every evening. 30 tablet 11   ??? amLODIPine (NORVASC) 10 MG tablet Take 1 tablet (10 mg total) by mouth daily. 30 tablet 11   ??? aspirin (ECOTRIN) 81 MG tablet Take 1 tablet (81 mg total) by mouth daily. 30 tablet 11   ??? atorvastatin (LIPITOR) 40 MG tablet Take 1 tablet (40 mg total) by mouth daily. 90 tablet 3   ??? carvediloL (COREG) 6.25 MG tablet Take 2 tablets (12.5 mg total) by mouth Two (2) times a day. 360 tablet 3   ??? cinacalcet (SENSIPAR) 60 MG tablet Hold (Patient not taking: Reported on 08/26/2019)     ??? ENVARSUS XR 1 mg Tb24 extended release tablet Take 2 tablets (2 mg total) by mouth daily with 2 (4 mg) tablets for a total dose of 10 mg daily. 60 tablet 11   ??? ENVARSUS XR 4 mg Tb24 extended release tablet Take 2 tablets (8 mg total) by mouth daily with 2 (1 mg) tablets for a total dose of 10 mg daily. 60 tablet 11   ??? ergocalciferol-1,250 mcg, 50,000 unit, (DRISDOL) 1,250 mcg (50,000 unit) capsule Take 1 capsule (50,000 Units total) by mouth once a week. 4 capsule 1   ??? magnesium oxide-Mg AA chelate (MAGNESIUM, AMINO ACID CHELATE,) 133 mg Tab Take 3 tablets by mouth Three (3) times a day. 810 tablet 3   ??? metFORMIN (GLUCOPHAGE-XR) 500 MG 24 hr tablet Take 2 tablets (1,000 mg total) by mouth once daily. 180 tablet 3   ??? mycophenolate (MYFORTIC) 180 MG EC tablet Take 3 tablets (540 mg total) by mouth Two (2) times a day. 180 tablet 11   ??? omeprazole (PRILOSEC) 40 MG capsule Take 1 capsule (40 mg total) by mouth every other day. 45 capsule 3   ??? papaverine 16.7 mg/mL phentolamine 0.56 mg/mL alprostadil 5.6 mcg/mL injection Inject as directed by physician, starting with 0.1 mL. May increase by 0.1 ml with next dose if not effective. 5 mL 11   ??? semaglutide 3 mg Tab Take 1 tablet (3 mg) by mouth daily. 90 tablet 3   ??? sildenafiL (VIAGRA) 50 MG tablet Take 1 tablet (50 mg total) by mouth nightly as needed for erectile dysfunction. 20 tablet 1   ??? sodium polystyrene, SPS, with sorbitol (SPS) 15-20 gram/60 mL Susp Take 60 ml (30 grams) by mouth today and 60 ml (30 grams) by mouth tomorrow 230 mL 0   ??? sodium zirconium cyclosilicate (LOKELMA) 10 gram PwPk packet Take three times a day for 2 days 6 packet 0     Current Facility-Administered Medications   Medication Dose Route Frequency Provider Last Rate Last Admin   ??? sodium chloride bacteriostatic 0.9 % injection 0.1 mL  0.1 mL Injection Once  Nilda Simmer Lorbacher, AGNP            Changes to medications: Edis reports no changes at this time.    No Known Allergies    Changes to allergies: No    SPECIALTY MEDICATION ADHERENCE     envarsus 1mg   : 10 days of medicine on hand   envarsus 4mg   : 10 days of medicine on hand   mycophenolate 180mg   : 25 days of medicine on hand     Medication Adherence    Patient reported X missed doses in the last month: 0  Specialty Medication: envarsus 1mg   Patient is on additional specialty medications: Yes  Additional Specialty Medications: envarsus 4mg   Patient Reported Additional Medication X Missed Doses in the Last Month: 0  Patient is on more than two specialty medications: Yes  Specialty Medication: mycophenolate 180mg   Patient Reported Additional Medication X Missed Doses in the Last Month: 0  Any gaps in refill history greater than 2 weeks in the last 3 months: no          Specialty medication(s) dose(s) confirmed: Regimen is correct and unchanged.     Are there any concerns with adherence? No    Adherence counseling provided? Not needed    CLINICAL MANAGEMENT AND INTERVENTION      Clinical Benefit Assessment:    Do you feel the medicine is effective or helping your condition? Yes    Clinical Benefit counseling provided? Not needed    Adverse Effects Assessment:    Are you experiencing any side effects? No    Are you experiencing difficulty administering your medicine? No    Quality of Life Assessment:    How many days over the past month did your transplant  keep you from your normal activities? For example, brushing your teeth or getting up in the morning. 0    Have you discussed this with your provider? Not needed    Therapy Appropriateness:    Is therapy appropriate? Yes, therapy is appropriate and should be continued    DISEASE/MEDICATION-SPECIFIC INFORMATION      N/A    PATIENT SPECIFIC NEEDS     - Does the patient have any physical, cognitive, or cultural barriers? No    - Is the patient high risk? Yes, patient is taking a REMS drug. Medication is dispensed in compliance with REMS program.     - Does the patient require a Care Management Plan? No     - Does the patient require physician intervention or other additional services (i.e. nutrition, smoking cessation, social work)? No      SHIPPING     Specialty Medication(s) to be Shipped:   Transplant: Envarsus 1mg  and Envarsus 4mg     Other medication(s) to be shipped: coreg, lipitor, mgplus, metformin, omeprazole     Changes to insurance: No    Delivery Scheduled: Yes, Expected medication delivery date: 11/25/2019.     Medication will be delivered via Next Day Courier to the confirmed prescription address in [facility].    The patient will receive a drug information handout for each medication shipped and additional FDA Medication Guides as required.  Verified that patient has previously received a Conservation officer, historic buildings.    All of the patient's questions and concerns have been addressed.    Thad Ranger   [facility] Specialty Pharmacist

## 2019-11-20 ENCOUNTER — Encounter: Admit: 2019-11-20 | Discharge: 2019-11-21 | Payer: MEDICARE

## 2019-11-20 LAB — CBC W/ AUTO DIFF
BASOPHILS ABSOLUTE COUNT: 0.1 10*9/L (ref 0.0–0.1)
BASOPHILS RELATIVE PERCENT: 1.1 %
EOSINOPHILS RELATIVE PERCENT: 4.1 %
HEMATOCRIT: 33.9 % — ABNORMAL LOW (ref 38.0–50.0)
HEMOGLOBIN: 10.9 g/dL — ABNORMAL LOW (ref 13.5–17.5)
LYMPHOCYTES ABSOLUTE COUNT: 0.1 10*9/L — ABNORMAL LOW (ref 0.7–4.0)
LYMPHOCYTES RELATIVE PERCENT: 0.9 %
MEAN CORPUSCULAR HEMOGLOBIN CONC: 32 g/dL (ref 30.0–36.0)
MEAN CORPUSCULAR HEMOGLOBIN: 28.3 pg (ref 26.0–34.0)
MEAN CORPUSCULAR VOLUME: 88.6 fL (ref 81.0–95.0)
MONOCYTES ABSOLUTE COUNT: 0.5 10*9/L (ref 0.1–1.0)
MONOCYTES RELATIVE PERCENT: 5.6 %
NEUTROPHILS ABSOLUTE COUNT: 8.4 10*9/L — ABNORMAL HIGH (ref 1.7–7.7)
NEUTROPHILS RELATIVE PERCENT: 88.3 %
NUCLEATED RED BLOOD CELLS: 0 /100{WBCs} (ref <=4)
PLATELET COUNT: 244 10*9/L (ref 150–450)
RED BLOOD CELL COUNT: 3.83 10*12/L — ABNORMAL LOW (ref 4.32–5.72)
RED CELL DISTRIBUTION WIDTH: 15.3 % — ABNORMAL HIGH (ref 12.0–15.0)
WBC ADJUSTED: 9.5 10*9/L (ref 3.5–10.5)

## 2019-11-20 LAB — RENAL FUNCTION PANEL
ALBUMIN: 3.9 g/dL (ref 3.5–5.0)
ANION GAP: 10 mmol/L (ref 7–15)
BLOOD UREA NITROGEN: 22 mg/dL — ABNORMAL HIGH (ref 7–21)
BUN / CREAT RATIO: 13
CALCIUM: 10.1 mg/dL (ref 8.5–10.2)
CHLORIDE: 109 mmol/L — ABNORMAL HIGH (ref 98–107)
CO2: 25 mmol/L (ref 22.0–30.0)
EGFR CKD-EPI AA MALE: 47 mL/min/{1.73_m2} — ABNORMAL LOW (ref >=60)
EGFR CKD-EPI NON-AA MALE: 41 mL/min/{1.73_m2} — ABNORMAL LOW (ref >=60)
GLUCOSE RANDOM: 126 mg/dL (ref 70–179)
PHOSPHORUS: 3.1 mg/dL (ref 2.9–4.7)
POTASSIUM: 5.2 mmol/L — ABNORMAL HIGH (ref 3.5–5.0)
SODIUM: 144 mmol/L (ref 135–145)

## 2019-11-20 LAB — TACROLIMUS BLOOD: Lab: 7.3

## 2019-11-20 LAB — MAGNESIUM: Magnesium:MCnc:Pt:Ser/Plas:Qn:: 1.4 — ABNORMAL LOW

## 2019-11-20 LAB — HEMOGLOBIN: Hemoglobin:MCnc:Pt:Bld:Qn:: 10.9 — ABNORMAL LOW

## 2019-11-20 LAB — ALBUMIN: Albumin:MCnc:Pt:Ser/Plas:Qn:: 3.9

## 2019-11-24 ENCOUNTER — Encounter: Admit: 2019-11-24 | Discharge: 2019-11-25 | Payer: MEDICARE

## 2019-11-24 LAB — ANION GAP: Anion gap 3:SCnc:Pt:Ser/Plas:Qn:: 11

## 2019-11-24 LAB — CBC W/ AUTO DIFF
BASOPHILS ABSOLUTE COUNT: 0 10*9/L (ref 0.0–0.1)
BASOPHILS RELATIVE PERCENT: 0.4 %
EOSINOPHILS ABSOLUTE COUNT: 0.4 10*9/L (ref 0.0–0.7)
LYMPHOCYTES ABSOLUTE COUNT: 0.2 10*9/L — ABNORMAL LOW (ref 0.7–4.0)
LYMPHOCYTES RELATIVE PERCENT: 2 %
MEAN CORPUSCULAR HEMOGLOBIN CONC: 32.8 g/dL (ref 30.0–36.0)
MEAN CORPUSCULAR HEMOGLOBIN: 28.9 pg (ref 26.0–34.0)
MEAN CORPUSCULAR VOLUME: 87.9 fL (ref 81.0–95.0)
MEAN PLATELET VOLUME: 8.6 fL (ref 7.0–10.0)
MONOCYTES ABSOLUTE COUNT: 0.5 10*9/L (ref 0.1–1.0)
MONOCYTES RELATIVE PERCENT: 5.7 %
NEUTROPHILS ABSOLUTE COUNT: 7.6 10*9/L (ref 1.7–7.7)
NEUTROPHILS RELATIVE PERCENT: 87.5 %
NUCLEATED RED BLOOD CELLS: 0 /100{WBCs} (ref <=4)
PLATELET COUNT: 259 10*9/L (ref 150–450)
RED BLOOD CELL COUNT: 3.84 10*12/L — ABNORMAL LOW (ref 4.32–5.72)
RED CELL DISTRIBUTION WIDTH: 15.3 % — ABNORMAL HIGH (ref 12.0–15.0)
WBC ADJUSTED: 8.7 10*9/L (ref 3.5–10.5)

## 2019-11-24 LAB — RENAL FUNCTION PANEL
ALBUMIN: 4.1 g/dL (ref 3.5–5.0)
ANION GAP: 11 mmol/L (ref 7–15)
BLOOD UREA NITROGEN: 34 mg/dL — ABNORMAL HIGH (ref 7–21)
BUN / CREAT RATIO: 19
CHLORIDE: 109 mmol/L — ABNORMAL HIGH (ref 98–107)
CO2: 22 mmol/L (ref 22.0–30.0)
CREATININE: 1.75 mg/dL — ABNORMAL HIGH (ref 0.70–1.30)
EGFR CKD-EPI AA MALE: 46 mL/min/{1.73_m2} — ABNORMAL LOW (ref >=60)
EGFR CKD-EPI NON-AA MALE: 39 mL/min/{1.73_m2} — ABNORMAL LOW (ref >=60)
GLUCOSE RANDOM: 98 mg/dL (ref 70–179)
PHOSPHORUS: 3.3 mg/dL (ref 2.9–4.7)
POTASSIUM: 5.3 mmol/L — ABNORMAL HIGH (ref 3.5–5.0)
SODIUM: 142 mmol/L (ref 135–145)

## 2019-11-24 LAB — TACROLIMUS BLOOD: Lab: 7.3

## 2019-11-24 LAB — MEAN PLATELET VOLUME: Platelet mean volume:EntVol:Pt:Bld:Qn:Automated count: 8.6

## 2019-11-24 LAB — MAGNESIUM: Magnesium:MCnc:Pt:Ser/Plas:Qn:: 1.4 — ABNORMAL LOW

## 2019-11-24 MED FILL — METFORMIN ER 500 MG TABLET,EXTENDED RELEASE 24 HR: ORAL | 90 days supply | Qty: 180 | Fill #1

## 2019-11-24 MED FILL — ENVARSUS XR 4 MG TABLET,EXTENDED RELEASE: 30 days supply | Qty: 60 | Fill #1 | Status: AC

## 2019-11-24 MED FILL — MG-PLUS-PROTEIN 133 MG TABLET: 90 days supply | Qty: 810 | Fill #1 | Status: AC

## 2019-11-24 MED FILL — ENVARSUS XR 1 MG TABLET,EXTENDED RELEASE: ORAL | 30 days supply | Qty: 60 | Fill #2

## 2019-11-24 MED FILL — ENVARSUS XR 4 MG TABLET,EXTENDED RELEASE: ORAL | 30 days supply | Qty: 60 | Fill #1

## 2019-11-24 MED FILL — METFORMIN ER 500 MG TABLET,EXTENDED RELEASE 24 HR: 90 days supply | Qty: 180 | Fill #1 | Status: AC

## 2019-11-24 MED FILL — OMEPRAZOLE 40 MG CAPSULE,DELAYED RELEASE: 90 days supply | Qty: 45 | Fill #1 | Status: AC

## 2019-11-24 MED FILL — MG-PLUS-PROTEIN 133 MG TABLET: ORAL | 90 days supply | Qty: 810 | Fill #1

## 2019-11-24 MED FILL — OMEPRAZOLE 40 MG CAPSULE,DELAYED RELEASE: ORAL | 90 days supply | Qty: 45 | Fill #1

## 2019-11-24 MED FILL — CARVEDILOL 6.25 MG TABLET: 90 days supply | Qty: 360 | Fill #1 | Status: AC

## 2019-11-24 MED FILL — ENVARSUS XR 1 MG TABLET,EXTENDED RELEASE: 30 days supply | Qty: 60 | Fill #2 | Status: AC

## 2019-11-24 MED FILL — CARVEDILOL 6.25 MG TABLET: ORAL | 90 days supply | Qty: 360 | Fill #1

## 2019-11-24 MED FILL — ATORVASTATIN 40 MG TABLET: 90 days supply | Qty: 90 | Fill #1 | Status: AC

## 2019-11-24 MED FILL — ATORVASTATIN 40 MG TABLET: ORAL | 90 days supply | Qty: 90 | Fill #1

## 2019-11-26 DIAGNOSIS — Z79899 Other long term (current) drug therapy: Principal | ICD-10-CM

## 2019-11-27 ENCOUNTER — Ambulatory Visit: Admit: 2019-11-27 | Discharge: 2019-11-27 | Payer: MEDICARE

## 2019-11-27 ENCOUNTER — Encounter: Admit: 2019-11-27 | Discharge: 2019-11-27 | Payer: MEDICARE | Attending: Nephrology | Primary: Nephrology

## 2019-11-27 DIAGNOSIS — I10 Essential (primary) hypertension: Principal | ICD-10-CM

## 2019-11-27 DIAGNOSIS — Z940 Kidney transplant status: Principal | ICD-10-CM

## 2019-11-27 DIAGNOSIS — Z79899 Other long term (current) drug therapy: Principal | ICD-10-CM

## 2019-11-27 DIAGNOSIS — D849 Immunodeficiency, unspecified: Principal | ICD-10-CM

## 2019-11-27 LAB — PROTEIN / CREATININE RATIO, URINE: PROTEIN/CREAT RATIO, URINE: 0.035

## 2019-11-27 LAB — CALCIUM: Calcium:MCnc:Pt:Ser/Plas:Qn:: 10.1

## 2019-11-27 LAB — CREATININE, URINE: Lab: 174.8

## 2019-11-27 LAB — URINALYSIS
BACTERIA: NONE SEEN /HPF
BILIRUBIN UA: NEGATIVE
BLOOD UA: NEGATIVE
GLUCOSE UA: NEGATIVE
KETONES UA: NEGATIVE
LEUKOCYTE ESTERASE UA: NEGATIVE
NITRITE UA: NEGATIVE
PH UA: 5 (ref 5.0–9.0)
PROTEIN UA: NEGATIVE
RBC UA: <1 /HPF (ref <=3)
SPECIFIC GRAVITY UA: 1.015 (ref 1.003–1.030)
SQUAMOUS EPITHELIAL: <1 /HPF (ref 0–5)
UROBILINOGEN UA: 0.2
WBC UA: <1 /HPF (ref <=2)

## 2019-11-27 LAB — PROTEIN UA: Protein:MCnc:Pt:Urine:Qn:Test strip: NEGATIVE

## 2019-11-27 NOTE — Unmapped (Signed)
Transplant Nephrology Clinic Visit    Subjective/Interval:     Doing excellent, no acute issues. On going mild hyperkalemia, otherwise all labs holding well.  Patient not demonstrating any symptoms of drug toxicity.  As of now patient with no acute issues, no new complaints, no fever chills or sweats. no chest pain palpitations orthopnea or shortness of breath. no lightheaded. no lower extremity edema. no abdominal pain no n/v/d. no myalgias or arthralgias. no dysuria hematuria or difficulty voiding.Other ros mostly negative.     Assessment:  69 y.o. 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       Recommendations/Plan:     Allograft Function: DDKT KDPI: 42%06/22/2019  Renal function holding relatively stable with electrolytes and acid base balanced.     Baseline Cr: ~ 1.8  Last Cr:  1.7 Date: 11/24/19    [place]/ BK     neg Date:  08/26/19   DSA     neg Date:   07/28/20  CMV:     neg Date:   08/26/19  UPC:     0.04 Date:    March 2021    ?? Cr holding steady at baseline   ?? K continues to run high normal / but no need for treatment with lokelma   ?? Will obtain BK/ CMV lvls   ?? Repeat DSA this visit   ?? Check urine electrolytes due to persistent K       Immunosuppression Management [High Risk Medical Decision Making For Drug Therapy Requiring Intensive Monitoring For Toxicity]:     Tacro target: 7-9    Tacro dose: 10mg    Tacro last lvl /date: 7.3 (11/24/19)   MMF/MPA dose : 540 bid   Wbc/anc count: 8.7 / 7.6  Side effects: none    ?? Continue with tac goal og 7-9 and will reduce to 6-8 by 9 months   ?? Continue with current myfortic     Blood Pressure Management:BP well controlled on  Coreg to 12.50 mg BID.      Lipid Management: Last lipid panel on 08/20/2018.  The 10-year ASCVD risk score Denman George DC Jr., et al., 2013) is: 36.3% /  Currently taking atorvastatin . Continue the same     Electrolytes: Electrolyte and metabolic parameters in acceptable range.  Magnesium continues to be low normal despite high supplement. continue magnesium supplements to TID.  Ergocalciferol to be continued.   ?? Obtain PTH for completion of work up   ?? Vitamin D lvl     Infectious Prophylaxis and Monitoring:   The patient finished Valcyte Sep 22 2019  and Bactrim will end 12/20/19 prophylaxis.   Repeat BK/ CMV     Gout: No flares recently.  Continue on Allopurinol.     RCC s/p bilateral nephrectomy:   Last CT RMP 08/29/2018 showed no suspicious enhancing lesions.   ?? 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     Health Maintenance:   Colonoscopy: 04/30/2018 - 1 adenomatous polyp; repeat 5-10 years  CT scan repeat to be scheduled before next appointment due to rcc history and some residual LN  Immunizations:   Flu Shot: 04/30/2019  Prevnar 13: 10/30/2013  Pneumovax: 04/24/2016, due 2022  No immunizations till 1 year post transplant.  Finished covid vaccines           History of Present Illness    69 y.o. male here for follow up after kidney transplantation.  Patient has ESRD secondary to HTN.  Patient has been on dialysis since 01/25/2012.  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 Patient was admitted for kidney transplant on 06/21/2019.  Patient experienced hyperkalemia that despite medical management would not decrease.  2 hours of HD performed.  Patient discharged 06/26/2019.       Transplant History:    Organ Received: Left DDKT, DBD, PHS, KDPI: 42%; 21h cold ischemia  Native Kidney Disease: HTN; cPRA: 0%  Date of Transplant: 06/22/2019  Post-Transplant Course: HD once for high potassium  Prior Transplants: none  Induction: [place]  Date of Ureteral Stent Removal: 07/29/2019  CMV/EBV Status: CMV D+/R+  EBV D+/R+  Rejection Episodes: none  Donor Specific Antibodies: none  Results of Renal Imaging (pre and post):   Pre-Txp 08/29/2018 CT RMP  Sequela of bilateral nephrectomy. Interval decrease in linear soft tissue within the right nephrectomy bed, potentially postsurgical or fat necrosis. Unchanged linear tissue within the left nephrectomy bed. No suspicious enhancing lesions are visualized within the surgical beds    Post-Txp 06/25/2019 (txp kidney only)  The renal transplant was located in the left lower quadrant. Normal size and echogenicity.  No solid masses or calculi. Trace perinephric fluid adjacent to the lower pole of the kidney. Mild pelviectasis  - Perfusion: Using power Doppler, normal perfusion was seen throughout the renal parenchyma.  - Resistive indices in the renal transplant are stable compared with prior examination.  - Main renal artery/iliac artery: Patent. Again noted are 3 renal arteries. Resistive indices within the renal arteries are stable to minimally increased, now at or just above normal limits.  - Main renal vein/iliac vein: Patent    Past Medical History  1. HTN  2. DM II  3. RCC s/p Bilateral nephrectomy (L 04/2016, R 12/2016)  4. GERD  5. HLD  6. 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    Medications  Current Outpatient Medications   Medication Sig Dispense Refill   ??? acetaminophen (TYLENOL) 500 MG tablet Take 1-2 tablets (500-1,000 mg total) by mouth every six (6) hours as needed for pain or fever (> 38C). (Patient not taking: Reported on 10/27/2019) 100 tablet 0   ??? allopurinoL (ZYLOPRIM) 100 MG tablet Take 1 tablet (100 mg total) by mouth every evening. 30 tablet 11   ??? amLODIPine (NORVASC) 10 MG tablet Take 1 tablet (10 mg total) by mouth daily. 30 tablet 11   ??? aspirin (ECOTRIN) 81 MG tablet Take 1 tablet (81 mg total) by mouth daily. 30 tablet 11   ??? atorvastatin (LIPITOR) 40 MG tablet Take 1 tablet (40 mg total) by mouth daily. 90 tablet 3   ??? carvediloL (COREG) 6.25 MG tablet Take 2 tablets (12.5 mg total) by mouth Two (2) times a day. 360 tablet 3   ??? cinacalcet (SENSIPAR) 60 MG tablet Hold (Patient not taking: Reported on 08/26/2019)     ??? ENVARSUS XR 1 mg Tb24 extended release tablet Take 2 tablets (2 mg total) by mouth daily with 2 (4  mg) tablets for a total dose of 10 mg daily. 60 tablet 11   ??? ENVARSUS XR 4 mg Tb24 extended release tablet Take 2 tablets (8 mg total) by mouth daily with 2 (1 mg) tablets for a total dose of 10 mg daily. 60 tablet 11   ??? ergocalciferol-1,250 mcg, 50,000 unit, (DRISDOL) 1,250 mcg (50,000 unit) capsule Take 1 capsule (50,000 Units total) by mouth once a week. 4 capsule 1   ??? magnesium oxide-Mg AA chelate (MAGNESIUM, AMINO ACID CHELATE,) 133 mg Tab Take 3 tablets by mouth Three (3) times a day. 810 tablet 3   ??? metFORMIN (GLUCOPHAGE-XR) 500 MG 24 hr tablet Take 2 tablets (1,000 mg total) by mouth once daily. 180 tablet 3   ??? mycophenolate (MYFORTIC) 180 MG EC tablet Take 3 tablets (540 mg total) by mouth Two (2) times a day. 180 tablet 11   ??? omeprazole (PRILOSEC) 40 MG capsule Take 1 capsule (40 mg total) by mouth every other day. 45 capsule 3   ??? papaverine 16.7 mg/mL phentolamine 0.56 mg/mL alprostadil 5.6 mcg/mL injection Inject as directed by physician, starting with 0.1 mL. May increase by 0.1 ml with next dose if not effective. 5 mL 11   ??? semaglutide 3 mg Tab Take 1 tablet (3 mg) by mouth daily. 90 tablet 3   ??? sildenafiL (VIAGRA) 50 MG tablet Take 1 tablet (50 mg total) by mouth nightly as needed for erectile dysfunction. 20 tablet 1   ??? sodium polystyrene, SPS, with sorbitol (SPS) 15-20 gram/60 mL Susp Take 60 ml (30 grams) by mouth today and 60 ml (30 grams) by mouth tomorrow 230 mL 0   ??? sodium zirconium cyclosilicate (LOKELMA) 10 gram PwPk packet Take three times a day for 2 days 6 packet 0     Current Facility-Administered Medications   Medication Dose Route Frequency Provider Last Rate Last Admin   ??? sodium chloride bacteriostatic 0.9 % injection 0.1 mL  0.1 mL Injection Once Sylvan Cheese, [place]               Physical Exam  BP 144/68 (BP Site: R Arm, BP Position: Sitting, BP Cuff Size: Medium)  - Pulse 63  - Temp 36.5 ??C (97.7 ??F) (Temporal)  - Ht 167.6 cm (5' 6)  - Wt 77.2 kg (170 lb 3.2 oz)  - BMI 27.47 kg/m??   General: no acute distress  HEENT: wearing mask, PERRL  Neck: neck supple, no cervical lymphadenopathy appreciated  CV: normal rate, normal rhythm, no murmur, no gallops, no rubs appreciated  Lungs: clear to auscultation bilaterally  Abdomen: soft, non tender, incision c/d/i; no erythema or edema noted  Extremities:  no edema, L forearm fistula good thrill   Musculoskeletal: no visible deformity, normal range of motion.  Pulses: intact distally throughout  Neurologic: awake, alert, and oriented x3    Laboratory Data and Imaging reviewed in EPIC    Counseling:  I counseled the patient on:  The need to avoid sun exposure and the use of sunblock while outdoors given the relatively higher risk of skin malignancy in an immunosuppressed state.  The need for adherence to immunosuppression medication.  Patient verbalized understanding.     Follow-Up:  Return to clinic in 7 weeks   Labs: twice a week  Patient will continue to follow-up with his primary care provider for non-transplant related issues and medication refills. We have ordered transplant specific labs per the center's guidelines to monitor and assess for toxicities from  immunosuppressant drug therapy

## 2019-11-27 NOTE — Unmapped (Signed)
Urine collected and sent to lab.

## 2019-11-28 LAB — VITAMIN D, TOTAL (25OH): Lab: 52

## 2019-11-28 LAB — CMV DNA, QUANTITATIVE, PCR: CMV QUANT LOG10: 2.85 {Log_IU}/mL — ABNORMAL HIGH (ref <0.00)

## 2019-11-28 LAB — CMV QUANT LOG10: Lab: 2.85 — ABNORMAL HIGH

## 2019-11-28 LAB — VITAMIN D 25 HYDROXY: VITAMIN D, TOTAL (25OH): 52 ng/mL (ref 20.0–80.0)

## 2019-11-29 LAB — HEMOGLOBIN A1C
ESTIMATED AVERAGE GLUCOSE: 154 mg/dL
Hemoglobin A1c/Hemoglobin.total:MFr:Pt:Bld:Qn:: 7 — ABNORMAL HIGH

## 2019-11-29 LAB — HIV RNA, QUANTITATIVE, PCR

## 2019-11-29 LAB — HIV RNA LOG(10): Lab: 0

## 2019-12-01 ENCOUNTER — Ambulatory Visit: Admit: 2019-12-01 | Discharge: 2019-12-02 | Payer: MEDICARE

## 2019-12-01 LAB — MAGNESIUM: Magnesium:MCnc:Pt:Ser/Plas:Qn:: 1.6

## 2019-12-01 LAB — CBC W/ AUTO DIFF
BASOPHILS RELATIVE PERCENT: 1.3 %
EOSINOPHILS ABSOLUTE COUNT: 0.4 10*9/L (ref 0.0–0.7)
EOSINOPHILS RELATIVE PERCENT: 4.7 %
HEMATOCRIT: 33.9 % — ABNORMAL LOW (ref 38.0–50.0)
HEMOGLOBIN: 11.1 g/dL — ABNORMAL LOW (ref 13.5–17.5)
LYMPHOCYTES ABSOLUTE COUNT: 0.1 10*9/L — ABNORMAL LOW (ref 0.7–4.0)
LYMPHOCYTES RELATIVE PERCENT: 0.8 %
MEAN CORPUSCULAR HEMOGLOBIN CONC: 32.6 g/dL (ref 30.0–36.0)
MEAN CORPUSCULAR HEMOGLOBIN: 28.5 pg (ref 26.0–34.0)
MEAN CORPUSCULAR VOLUME: 87.3 fL (ref 81.0–95.0)
MEAN PLATELET VOLUME: 7.9 fL (ref 7.0–10.0)
MONOCYTES ABSOLUTE COUNT: 0.4 10*9/L (ref 0.1–1.0)
MONOCYTES RELATIVE PERCENT: 5.6 %
NEUTROPHILS ABSOLUTE COUNT: 6.8 10*9/L (ref 1.7–7.7)
NEUTROPHILS RELATIVE PERCENT: 87.6 %
NUCLEATED RED BLOOD CELLS: 0 /100{WBCs} (ref <=4)
PLATELET COUNT: 244 10*9/L (ref 150–450)
RED BLOOD CELL COUNT: 3.89 10*12/L — ABNORMAL LOW (ref 4.32–5.72)
WBC ADJUSTED: 7.8 10*9/L (ref 3.5–10.5)

## 2019-12-01 LAB — RENAL FUNCTION PANEL
ALBUMIN: 4.2 g/dL (ref 3.5–5.0)
ANION GAP: 8 mmol/L (ref 7–15)
BLOOD UREA NITROGEN: 26 mg/dL — ABNORMAL HIGH (ref 7–21)
BUN / CREAT RATIO: 15
CHLORIDE: 112 mmol/L — ABNORMAL HIGH (ref 98–107)
CO2: 23 mmol/L (ref 22.0–30.0)
CREATININE: 1.73 mg/dL — ABNORMAL HIGH (ref 0.70–1.30)
EGFR CKD-EPI AA MALE: 46 mL/min/{1.73_m2} — ABNORMAL LOW (ref >=60)
EGFR CKD-EPI NON-AA MALE: 40 mL/min/{1.73_m2} — ABNORMAL LOW (ref >=60)
GLUCOSE RANDOM: 108 mg/dL (ref 70–179)
PHOSPHORUS: 3.4 mg/dL (ref 2.9–4.7)
POTASSIUM: 5.5 mmol/L — ABNORMAL HIGH (ref 3.5–5.0)
SODIUM: 143 mmol/L (ref 135–145)

## 2019-12-01 LAB — TACROLIMUS BLOOD: Lab: 8.6

## 2019-12-01 LAB — HBV DNA QUANT (MAYO): Hepatitis B virus DNA:ACnc:Pt:Ser/Plas:Qn:Probe.amp.tar: NOT DETECTED

## 2019-12-01 LAB — ANION GAP: Anion gap 3:SCnc:Pt:Ser/Plas:Qn:: 8

## 2019-12-01 LAB — DOHLE BODIES

## 2019-12-01 LAB — SLIDE REVIEW

## 2019-12-01 LAB — HEPATITIS C RNA, QUANTITATIVE, PCR

## 2019-12-01 LAB — BASOPHILS RELATIVE PERCENT: Basophils/100 leukocytes:NFr:Pt:Bld:Qn:Automated count: 1.3

## 2019-12-01 LAB — HCV RNA(IU): Hepatitis C virus RNA:ACnc:Pt:Ser/Plas:Qn:Probe.amp.tar: 0

## 2019-12-01 NOTE — Unmapped (Signed)
Spoke to patient.  He questioned what was the highest dose of trimix he could go to.  He is currently using .25 mL.  Instructed him to go up 0.1 mL each time until erection was desirable but not to exceed .5 mL.  Could you clarify this?  Thanks,  Lanora Manis

## 2019-12-01 NOTE — Unmapped (Signed)
Please call patient he has some questions about his medication and he see Lorbacher. Thanks

## 2019-12-02 LAB — VITAMIN D 1,25-DIHYDROXY: 1,25-Dihydroxyvitamin D:MCnc:Pt:Ser/Plas:Qn:: 34

## 2019-12-02 NOTE — Unmapped (Signed)
Mychart message sent.

## 2019-12-03 LAB — BK VIRUS QUANTITATIVE PCR, BLOOD

## 2019-12-03 LAB — BK BLOOD RESULT: Lab: NOT DETECTED

## 2019-12-03 MED ORDER — ERGOCALCIFEROL (VITAMIN D2) 1,250 MCG (50,000 UNIT) CAPSULE
ORAL_CAPSULE | ORAL | 1 refills | 28 days | Status: CP
Start: 2019-12-03 — End: 2020-12-02
  Filled 2019-12-11: qty 4, 28d supply, fill #0

## 2019-12-10 ENCOUNTER — Encounter: Admit: 2019-12-10 | Discharge: 2019-12-11 | Payer: MEDICARE

## 2019-12-10 LAB — RENAL FUNCTION PANEL
ALBUMIN: 3.9 g/dL (ref 3.4–5.0)
ANION GAP: 6 mmol/L (ref 3–11)
BLOOD UREA NITROGEN: 28 mg/dL — ABNORMAL HIGH (ref 9–23)
BUN / CREAT RATIO: 15
CALCIUM: 10.4 mg/dL (ref 8.7–10.4)
CHLORIDE: 114 mmol/L — ABNORMAL HIGH (ref 98–107)
EGFR CKD-EPI AA MALE: 43 mL/min/{1.73_m2}
EGFR CKD-EPI NON-AA MALE: 37 mL/min/{1.73_m2}
GLUCOSE RANDOM: 108 mg/dL (ref 70–179)
PHOSPHORUS: 3.5 mg/dL (ref 2.4–5.1)
POTASSIUM: 5.1 mmol/L (ref 3.5–5.1)
SODIUM: 143 mmol/L (ref 135–145)

## 2019-12-10 LAB — MAGNESIUM: Magnesium:MCnc:Pt:Ser/Plas:Qn:: 1.8

## 2019-12-10 LAB — CBC W/ AUTO DIFF
BASOPHILS ABSOLUTE COUNT: 0.1 10*9/L (ref 0.0–0.1)
BASOPHILS RELATIVE PERCENT: 0.7 %
EOSINOPHILS ABSOLUTE COUNT: 0.5 10*9/L (ref 0.0–0.7)
EOSINOPHILS RELATIVE PERCENT: 5.4 %
HEMATOCRIT: 34 % — ABNORMAL LOW (ref 38.0–50.0)
HEMOGLOBIN: 11.1 g/dL — ABNORMAL LOW (ref 13.5–17.5)
LYMPHOCYTES ABSOLUTE COUNT: 0.1 10*9/L — ABNORMAL LOW (ref 0.7–4.0)
LYMPHOCYTES RELATIVE PERCENT: 0.9 %
MEAN CORPUSCULAR HEMOGLOBIN CONC: 32.6 g/dL (ref 30.0–36.0)
MEAN CORPUSCULAR HEMOGLOBIN: 28.8 pg (ref 26.0–34.0)
MEAN CORPUSCULAR VOLUME: 88.3 fL (ref 81.0–95.0)
MEAN PLATELET VOLUME: 7.5 fL (ref 7.0–10.0)
MONOCYTES ABSOLUTE COUNT: 0.4 10*9/L (ref 0.1–1.0)
MONOCYTES RELATIVE PERCENT: 5.4 %
NUCLEATED RED BLOOD CELLS: 0 /100{WBCs} (ref <=4)
PLATELET COUNT: 255 10*9/L (ref 150–450)
RED CELL DISTRIBUTION WIDTH: 16.1 % — ABNORMAL HIGH (ref 12.0–15.0)
WBC ADJUSTED: 8.3 10*9/L (ref 3.5–10.5)

## 2019-12-10 LAB — CO2: Carbon dioxide:SCnc:Pt:Ser/Plas:Qn:: 23.3

## 2019-12-10 LAB — SMEAR REVIEW: Lab: 0

## 2019-12-10 LAB — TACROLIMUS BLOOD: Lab: 8.3

## 2019-12-10 LAB — EOSINOPHILS RELATIVE PERCENT: Eosinophils/100 leukocytes:NFr:Pt:Bld:Qn:Automated count: 5.4

## 2019-12-11 LAB — HLA DS POST TRANSPLANT
ANTI-DONOR DRW #1 MFI: 291 MFI
ANTI-DONOR HLA-A #1 MFI: 263 MFI
ANTI-DONOR HLA-A #2 MFI: 18 MFI
ANTI-DONOR HLA-B #1 MFI: 67 MFI
ANTI-DONOR HLA-B #2 MFI: 49 MFI
ANTI-DONOR HLA-C #1 MFI: 39 MFI
ANTI-DONOR HLA-C #2 MFI: 228 MFI
ANTI-DONOR HLA-DP #2 MFI: 60 MFI
ANTI-DONOR HLA-DQB #1 MFI: 380 MFI
ANTI-DONOR HLA-DQB #2 MFI: 551 MFI
ANTI-DONOR HLA-DR #1 MFI: 176 MFI

## 2019-12-11 LAB — HLA CL1 ANTIBODY COMM: Lab: 0

## 2019-12-11 LAB — HLA CL2 AB COMMENT: Lab: 0

## 2019-12-11 LAB — ANTI-DONOR HLA-DQB #1 MFI: Lab: 380

## 2019-12-11 LAB — FSAB CLASS 2 ANTIBODY SPECIFICITY

## 2019-12-11 MED FILL — AMLODIPINE 10 MG TABLET: 30 days supply | Qty: 30 | Fill #4 | Status: AC

## 2019-12-11 MED FILL — ERGOCALCIFEROL (VITAMIN D2) 1,250 MCG (50,000 UNIT) CAPSULE: 28 days supply | Qty: 4 | Fill #0 | Status: AC

## 2019-12-11 MED FILL — ALLOPURINOL 100 MG TABLET: 30 days supply | Qty: 30 | Fill #2 | Status: AC

## 2019-12-11 MED FILL — ALLOPURINOL 100 MG TABLET: ORAL | 30 days supply | Qty: 30 | Fill #2

## 2019-12-11 MED FILL — AMLODIPINE 10 MG TABLET: ORAL | 30 days supply | Qty: 30 | Fill #4

## 2019-12-15 ENCOUNTER — Ambulatory Visit: Admit: 2019-12-15 | Discharge: 2019-12-16 | Payer: MEDICARE

## 2019-12-15 LAB — RENAL FUNCTION PANEL
ALBUMIN: 4 g/dL (ref 3.4–5.0)
ANION GAP: 3 mmol/L (ref 3–11)
BUN / CREAT RATIO: 14
CALCIUM: 10.5 mg/dL — ABNORMAL HIGH (ref 8.7–10.4)
CHLORIDE: 113 mmol/L — ABNORMAL HIGH (ref 98–107)
CO2: 24 mmol/L (ref 20.0–31.0)
CREATININE: 1.77 mg/dL — ABNORMAL HIGH (ref 0.60–1.10)
EGFR CKD-EPI AA MALE: 45 mL/min/{1.73_m2}
EGFR CKD-EPI NON-AA MALE: 39 mL/min/{1.73_m2}
PHOSPHORUS: 3.4 mg/dL (ref 2.4–5.1)
POTASSIUM: 5.4 mmol/L — ABNORMAL HIGH (ref 3.5–5.1)
SODIUM: 140 mmol/L (ref 135–145)

## 2019-12-15 LAB — MEAN CORPUSCULAR HEMOGLOBIN: Erythrocyte mean corpuscular hemoglobin:EntMass:Pt:RBC:Qn:Automated count: 29

## 2019-12-15 LAB — CBC W/ AUTO DIFF
BASOPHILS ABSOLUTE COUNT: 0.1 10*9/L (ref 0.0–0.1)
BASOPHILS RELATIVE PERCENT: 0.8 %
EOSINOPHILS ABSOLUTE COUNT: 0.4 10*9/L (ref 0.0–0.7)
EOSINOPHILS RELATIVE PERCENT: 5.2 %
HEMATOCRIT: 33.8 % — ABNORMAL LOW (ref 38.0–50.0)
HEMOGLOBIN: 11.1 g/dL — ABNORMAL LOW (ref 13.5–17.5)
LYMPHOCYTES ABSOLUTE COUNT: 0.1 10*9/L — ABNORMAL LOW (ref 0.7–4.0)
LYMPHOCYTES RELATIVE PERCENT: 0.7 %
MEAN CORPUSCULAR HEMOGLOBIN: 29 pg (ref 26.0–34.0)
MEAN CORPUSCULAR VOLUME: 87.9 fL (ref 81.0–95.0)
MONOCYTES RELATIVE PERCENT: 6.6 %
NEUTROPHILS ABSOLUTE COUNT: 6.7 10*9/L (ref 1.7–7.7)
NEUTROPHILS RELATIVE PERCENT: 86.7 %
NUCLEATED RED BLOOD CELLS: 0 /100{WBCs} (ref <=4)
PLATELET COUNT: 262 10*9/L (ref 150–450)
RED BLOOD CELL COUNT: 3.84 10*12/L — ABNORMAL LOW (ref 4.32–5.72)
RED CELL DISTRIBUTION WIDTH: 15.9 % — ABNORMAL HIGH (ref 12.0–15.0)
WBC ADJUSTED: 7.7 10*9/L (ref 3.5–10.5)

## 2019-12-15 LAB — EGFR CKD-EPI NON-AA MALE
Glomerular filtration rate/1.73 sq M.predicted.non black:ArVRat:Pt:Ser/Plas/Bld:Qn:Creatinine-based formula (CKD-EPI): 39

## 2019-12-15 LAB — TACROLIMUS BLOOD: Lab: 8.4

## 2019-12-15 LAB — MAGNESIUM: Magnesium:MCnc:Pt:Ser/Plas:Qn:: 1.6

## 2019-12-15 LAB — SMEAR REVIEW

## 2019-12-17 NOTE — Unmapped (Signed)
[facility] Refill Coordination Note    Specialty Medication(s) to be Shipped:   Transplant: Envarsus 1mg , Envarsus 4mg ,  mycophenolic acid 180mg  and Rybelsus    Other medication(s) to be shipped: none    **Envarsus 1mg  and 4mg : Delivered 12/23/19  Myfortic and Rybelsus: Deliver 12/19/19     Frank Leach, DOB: March 17, 1951  Phone: [phone] (home)       All above HIPAA information was verified with patient.     Was a Nurse, learning disability used for this call? No    Completed refill call assessment today to schedule patient's medication shipment from the [facility] [phone]).       Specialty medication(s) and dose(s) confirmed: Regimen is correct and unchanged.   Changes to medications: Zebulen reports no changes at this time.  Changes to insurance: No  Questions for the pharmacist: No    Confirmed patient received Welcome Packet with first shipment. The patient will receive a drug information handout for each medication shipped and additional FDA Medication Guides as required.       DISEASE/MEDICATION-SPECIFIC INFORMATION        N/A    SPECIALTY MEDICATION ADHERENCE      Envarsus 1mg : 10 days worth of medication on hand.  Envarsus 4mg : 10 days worth of medication on hand.  mycophenolic acid 180mg : 5 days worth of medication on hand.  Rybelsus: 4 days worth of medication on hand.            SHIPPING     Shipping address confirmed in Epic.     Delivery Scheduled: Yes, Expected medication delivery date: 12/19/19.     Medication will be delivered via UPS to the prescription address in Epic WAM.    [place] A Covey Baller   [facility] Specialty Technician

## 2019-12-18 MED FILL — RYBELSUS 3 MG TABLET: ORAL | 30 days supply | Qty: 30 | Fill #3

## 2019-12-18 MED FILL — MYCOPHENOLATE SODIUM 180 MG TABLET,DELAYED RELEASE: ORAL | 30 days supply | Qty: 180 | Fill #5

## 2019-12-18 MED FILL — MYCOPHENOLATE SODIUM 180 MG TABLET,DELAYED RELEASE: 30 days supply | Qty: 180 | Fill #5 | Status: AC

## 2019-12-18 MED FILL — RYBELSUS 3 MG TABLET: 30 days supply | Qty: 30 | Fill #3 | Status: AC

## 2019-12-19 NOTE — Unmapped (Signed)
Spoke to patient, he stated that he has tried .5 mL of trimix and received about half of an erection.  What would you advise moving forward?  Thanks,  Lanora Manis

## 2019-12-22 ENCOUNTER — Encounter: Admit: 2019-12-22 | Discharge: 2019-12-23 | Payer: MEDICARE

## 2019-12-22 LAB — CBC W/ AUTO DIFF
BASOPHILS ABSOLUTE COUNT: 0.1 10*9/L (ref 0.0–0.1)
BASOPHILS RELATIVE PERCENT: 1.4 %
EOSINOPHILS ABSOLUTE COUNT: 0.4 10*9/L (ref 0.0–0.7)
HEMATOCRIT: 33.7 % — ABNORMAL LOW (ref 38.0–50.0)
HEMOGLOBIN: 10.9 g/dL — ABNORMAL LOW (ref 13.5–17.5)
LYMPHOCYTES ABSOLUTE COUNT: 0.1 10*9/L — ABNORMAL LOW (ref 0.7–4.0)
LYMPHOCYTES RELATIVE PERCENT: 0.7 %
MEAN CORPUSCULAR HEMOGLOBIN CONC: 32.3 g/dL (ref 30.0–36.0)
MEAN CORPUSCULAR HEMOGLOBIN: 28.4 pg (ref 26.0–34.0)
MEAN CORPUSCULAR VOLUME: 87.9 fL (ref 81.0–95.0)
MONOCYTES ABSOLUTE COUNT: 0.4 10*9/L (ref 0.1–1.0)
MONOCYTES RELATIVE PERCENT: 5.3 %
NEUTROPHILS ABSOLUTE COUNT: 6.1 10*9/L (ref 1.7–7.7)
NEUTROPHILS RELATIVE PERCENT: 87.2 %
NUCLEATED RED BLOOD CELLS: 0 /100{WBCs} (ref <=4)
RED BLOOD CELL COUNT: 3.83 10*12/L — ABNORMAL LOW (ref 4.32–5.72)
RED CELL DISTRIBUTION WIDTH: 16.8 % — ABNORMAL HIGH (ref 12.0–15.0)
WBC ADJUSTED: 6.9 10*9/L (ref 3.5–10.5)

## 2019-12-22 LAB — RENAL FUNCTION PANEL
ALBUMIN: 4.1 g/dL (ref 3.4–5.0)
ANION GAP: 5 mmol/L (ref 3–11)
BUN / CREAT RATIO: 11
CALCIUM: 10.3 mg/dL (ref 8.7–10.4)
CHLORIDE: 110 mmol/L — ABNORMAL HIGH (ref 98–107)
CO2: 24.2 mmol/L (ref 20.0–31.0)
CREATININE: 1.66 mg/dL — ABNORMAL HIGH (ref 0.60–1.10)
EGFR CKD-EPI AA MALE: 49 mL/min/{1.73_m2}
EGFR CKD-EPI NON-AA MALE: 42 mL/min/{1.73_m2}
GLUCOSE RANDOM: 104 mg/dL (ref 70–179)
PHOSPHORUS: 3.1 mg/dL (ref 2.4–5.1)
POTASSIUM: 5.2 mmol/L — ABNORMAL HIGH (ref 3.5–5.1)

## 2019-12-22 LAB — EGFR CKD-EPI AA MALE: Glomerular filtration rate/1.73 sq M.predicted.black:ArVRat:Pt:Ser/Plas/Bld:Qn:Creatinine-based formula (CKD-EPI): 49

## 2019-12-22 LAB — TACROLIMUS BLOOD: Lab: 9.6

## 2019-12-22 LAB — HEMOGLOBIN: Hemoglobin:MCnc:Pt:Bld:Qn:: 10.9 — ABNORMAL LOW

## 2019-12-22 LAB — MAGNESIUM: Magnesium:MCnc:Pt:Ser/Plas:Qn:: 1.7

## 2019-12-22 MED FILL — ENVARSUS XR 4 MG TABLET,EXTENDED RELEASE: 30 days supply | Qty: 60 | Fill #2 | Status: AC

## 2019-12-22 MED FILL — ENVARSUS XR 4 MG TABLET,EXTENDED RELEASE: ORAL | 30 days supply | Qty: 60 | Fill #2

## 2019-12-22 MED FILL — ENVARSUS XR 1 MG TABLET,EXTENDED RELEASE: 30 days supply | Qty: 60 | Fill #3 | Status: AC

## 2019-12-22 MED FILL — ENVARSUS XR 1 MG TABLET,EXTENDED RELEASE: ORAL | 30 days supply | Qty: 60 | Fill #3

## 2019-12-24 NOTE — Unmapped (Signed)
Called patient, he stated that he no longer had any questions.

## 2019-12-24 NOTE — Unmapped (Signed)
Patient notified and verbalized understanding. 

## 2019-12-29 ENCOUNTER — Ambulatory Visit: Admit: 2019-12-29 | Discharge: 2019-12-30 | Payer: MEDICARE

## 2019-12-29 LAB — CBC W/ AUTO DIFF
BASOPHILS ABSOLUTE COUNT: 0.1 10*9/L (ref 0.0–0.1)
BASOPHILS RELATIVE PERCENT: 1.4 %
EOSINOPHILS ABSOLUTE COUNT: 0.3 10*9/L (ref 0.0–0.7)
EOSINOPHILS RELATIVE PERCENT: 5.5 %
HEMATOCRIT: 33 % — ABNORMAL LOW (ref 38.0–50.0)
HEMOGLOBIN: 10.7 g/dL — ABNORMAL LOW (ref 13.5–17.5)
LYMPHOCYTES ABSOLUTE COUNT: 0 10*9/L — ABNORMAL LOW (ref 0.7–4.0)
LYMPHOCYTES RELATIVE PERCENT: 0.5 %
MEAN CORPUSCULAR HEMOGLOBIN CONC: 32.5 g/dL (ref 30.0–36.0)
MEAN CORPUSCULAR VOLUME: 88 fL (ref 81.0–95.0)
MEAN PLATELET VOLUME: 7.9 fL (ref 7.0–10.0)
NEUTROPHILS ABSOLUTE COUNT: 5.4 10*9/L (ref 1.7–7.7)
NEUTROPHILS RELATIVE PERCENT: 87.5 %
NUCLEATED RED BLOOD CELLS: 0 /100{WBCs} (ref <=4)
PLATELET COUNT: 249 10*9/L (ref 150–450)
RED BLOOD CELL COUNT: 3.75 10*12/L — ABNORMAL LOW (ref 4.32–5.72)
RED CELL DISTRIBUTION WIDTH: 17 % — ABNORMAL HIGH (ref 12.0–15.0)
WBC ADJUSTED: 6.1 10*9/L (ref 3.5–10.5)

## 2019-12-29 LAB — RENAL FUNCTION PANEL
ALBUMIN: 4 g/dL (ref 3.4–5.0)
ANION GAP: 5 mmol/L (ref 3–11)
BLOOD UREA NITROGEN: 23 mg/dL (ref 9–23)
BUN / CREAT RATIO: 12
CALCIUM: 10.2 mg/dL (ref 8.7–10.4)
CHLORIDE: 112 mmol/L — ABNORMAL HIGH (ref 98–107)
CREATININE: 1.87 mg/dL — ABNORMAL HIGH (ref 0.60–1.10)
EGFR CKD-EPI AA MALE: 42 mL/min/{1.73_m2}
EGFR CKD-EPI NON-AA MALE: 36 mL/min/{1.73_m2}
GLUCOSE RANDOM: 96 mg/dL (ref 70–179)
PHOSPHORUS: 3.1 mg/dL (ref 2.4–5.1)
POTASSIUM: 5.2 mmol/L — ABNORMAL HIGH (ref 3.5–5.1)
SODIUM: 141 mmol/L (ref 135–145)

## 2019-12-29 LAB — MAGNESIUM: Magnesium:MCnc:Pt:Ser/Plas:Qn:: 1.8

## 2019-12-29 LAB — NUCLEATED RED BLOOD CELLS: Lab: 0

## 2019-12-29 LAB — SLIDE REVIEW

## 2019-12-29 LAB — CALCIUM: Calcium:MCnc:Pt:Ser/Plas:Qn:: 10.2

## 2019-12-29 LAB — TACROLIMUS BLOOD: Lab: 8.8

## 2019-12-29 LAB — SMEAR REVIEW

## 2020-01-06 ENCOUNTER — Encounter: Admit: 2020-01-06 | Discharge: 2020-01-07 | Payer: MEDICARE

## 2020-01-06 LAB — RENAL FUNCTION PANEL
ALBUMIN: 3.9 g/dL (ref 3.4–5.0)
ANION GAP: 3 mmol/L (ref 3–11)
BLOOD UREA NITROGEN: 24 mg/dL — ABNORMAL HIGH (ref 9–23)
CALCIUM: 9.8 mg/dL (ref 8.7–10.4)
CHLORIDE: 112 mmol/L — ABNORMAL HIGH (ref 98–107)
CO2: 24.8 mmol/L (ref 20.0–31.0)
CREATININE: 2.01 mg/dL — ABNORMAL HIGH (ref 0.60–1.10)
EGFR CKD-EPI AA MALE: 39 mL/min/{1.73_m2}
GLUCOSE RANDOM: 95 mg/dL (ref 70–179)
PHOSPHORUS: 3.3 mg/dL (ref 2.4–5.1)
POTASSIUM: 5.4 mmol/L — ABNORMAL HIGH (ref 3.5–5.1)
SODIUM: 140 mmol/L (ref 135–145)

## 2020-01-06 LAB — CBC W/ AUTO DIFF
BASOPHILS ABSOLUTE COUNT: 0.1 10*9/L (ref 0.0–0.1)
EOSINOPHILS ABSOLUTE COUNT: 0.4 10*9/L (ref 0.0–0.7)
HEMOGLOBIN: 10.6 g/dL — ABNORMAL LOW (ref 13.5–17.5)
LYMPHOCYTES ABSOLUTE COUNT: 0.1 10*9/L — ABNORMAL LOW (ref 0.7–4.0)
LYMPHOCYTES RELATIVE PERCENT: 1.8 %
MEAN CORPUSCULAR HEMOGLOBIN CONC: 32.5 g/dL (ref 30.0–36.0)
MEAN CORPUSCULAR HEMOGLOBIN: 28.8 pg (ref 26.0–34.0)
MEAN CORPUSCULAR VOLUME: 88.7 fL (ref 81.0–95.0)
MEAN PLATELET VOLUME: 8.1 fL (ref 7.0–10.0)
MONOCYTES ABSOLUTE COUNT: 0.3 10*9/L (ref 0.1–1.0)
MONOCYTES RELATIVE PERCENT: 4.8 %
NEUTROPHILS ABSOLUTE COUNT: 5.2 10*9/L (ref 1.7–7.7)
NEUTROPHILS RELATIVE PERCENT: 86.1 %
PLATELET COUNT: 252 10*9/L (ref 150–450)
RED BLOOD CELL COUNT: 3.68 10*12/L — ABNORMAL LOW (ref 4.32–5.72)
RED CELL DISTRIBUTION WIDTH: 17.1 % — ABNORMAL HIGH (ref 12.0–15.0)
WBC ADJUSTED: 6 10*9/L (ref 3.5–10.5)

## 2020-01-06 LAB — TACROLIMUS BLOOD: Lab: 13

## 2020-01-06 LAB — SODIUM: Sodium:SCnc:Pt:Ser/Plas:Qn:: 140

## 2020-01-06 LAB — MAGNESIUM: Magnesium:MCnc:Pt:Ser/Plas:Qn:: 1.8

## 2020-01-06 LAB — NEUTROPHILS RELATIVE PERCENT: Neutrophils/100 leukocytes:NFr:Pt:Bld:Qn:Automated count: 86.1

## 2020-01-07 DIAGNOSIS — Z940 Kidney transplant status: Principal | ICD-10-CM

## 2020-01-07 MED ORDER — ENVARSUS XR 1 MG TABLET,EXTENDED RELEASE
ORAL_TABLET | 11 refills | 0 days | Status: CP
Start: 2020-01-07 — End: ?

## 2020-01-07 MED ORDER — ENVARSUS XR 4 MG TABLET,EXTENDED RELEASE
ORAL_TABLET | 11 refills | 0.00000 days | Status: CP
Start: 2020-01-07 — End: ?

## 2020-01-07 NOTE — Unmapped (Signed)
Per L. Mincemoyer, decrease Envarsus to 9 mg.

## 2020-01-08 NOTE — Unmapped (Signed)
Dose change (decrease) - Envarsus XR tablets  Copay - $0 for both 4mg  and 1mg  tablets  Last filled - both strengths filled on 12/22/19 for 30 days

## 2020-01-12 ENCOUNTER — Ambulatory Visit: Admit: 2020-01-12 | Discharge: 2020-01-13 | Payer: MEDICARE

## 2020-01-12 LAB — MEAN CORPUSCULAR VOLUME: Erythrocyte mean corpuscular volume:EntVol:Pt:RBC:Qn:Automated count: 88.9

## 2020-01-12 LAB — CBC W/ AUTO DIFF
BASOPHILS ABSOLUTE COUNT: 0.1 10*9/L (ref 0.0–0.1)
BASOPHILS RELATIVE PERCENT: 1.3 %
EOSINOPHILS ABSOLUTE COUNT: 0.3 10*9/L (ref 0.0–0.7)
HEMATOCRIT: 32 % — ABNORMAL LOW (ref 38.0–50.0)
HEMOGLOBIN: 10.3 g/dL — ABNORMAL LOW (ref 13.5–17.5)
LYMPHOCYTES ABSOLUTE COUNT: 0 10*9/L — ABNORMAL LOW (ref 0.7–4.0)
LYMPHOCYTES RELATIVE PERCENT: 0.7 %
MEAN CORPUSCULAR HEMOGLOBIN CONC: 32.1 g/dL (ref 30.0–36.0)
MEAN CORPUSCULAR HEMOGLOBIN: 28.5 pg (ref 26.0–34.0)
MEAN CORPUSCULAR VOLUME: 88.9 fL (ref 81.0–95.0)
MEAN PLATELET VOLUME: 7.7 fL (ref 7.0–10.0)
MONOCYTES ABSOLUTE COUNT: 0.3 10*9/L (ref 0.1–1.0)
MONOCYTES RELATIVE PERCENT: 4.2 %
NEUTROPHILS ABSOLUTE COUNT: 5.4 10*9/L (ref 1.7–7.7)
NEUTROPHILS RELATIVE PERCENT: 88.6 %
NUCLEATED RED BLOOD CELLS: 0 /100{WBCs} (ref <=4)
RED BLOOD CELL COUNT: 3.6 10*12/L — ABNORMAL LOW (ref 4.32–5.72)
RED CELL DISTRIBUTION WIDTH: 17.5 % — ABNORMAL HIGH (ref 12.0–15.0)
WBC ADJUSTED: 6.1 10*9/L (ref 3.5–10.5)

## 2020-01-12 LAB — MAGNESIUM: Magnesium:MCnc:Pt:Ser/Plas:Qn:: 1.8

## 2020-01-12 LAB — RENAL FUNCTION PANEL
ALBUMIN: 3.9 g/dL (ref 3.4–5.0)
ANION GAP: 5 mmol/L (ref 3–11)
BLOOD UREA NITROGEN: 29 mg/dL — ABNORMAL HIGH (ref 9–23)
BUN / CREAT RATIO: 14
CHLORIDE: 112 mmol/L — ABNORMAL HIGH (ref 98–107)
CO2: 22.3 mmol/L (ref 20.0–31.0)
CREATININE: 2.01 mg/dL — ABNORMAL HIGH (ref 0.60–1.10)
EGFR CKD-EPI AA MALE: 39 mL/min/{1.73_m2}
EGFR CKD-EPI NON-AA MALE: 33 mL/min/{1.73_m2}
GLUCOSE RANDOM: 95 mg/dL (ref 70–179)
PHOSPHORUS: 3.4 mg/dL (ref 2.4–5.1)
POTASSIUM: 5.2 mmol/L — ABNORMAL HIGH (ref 3.5–5.1)
SODIUM: 139 mmol/L (ref 135–145)

## 2020-01-12 LAB — ANION GAP: Anion gap 3:SCnc:Pt:Ser/Plas:Qn:: 5

## 2020-01-12 LAB — SLIDE REVIEW

## 2020-01-12 LAB — ACANTHOCYTES

## 2020-01-12 LAB — TACROLIMUS BLOOD: Lab: 7.8

## 2020-01-12 NOTE — Unmapped (Signed)
[facility] Refill Coordination Note    Specialty Medication(s) to be Shipped:   Transplant: Envarsus 1mg , Envarsus 4mg ,  mycophenolic acid 180mg  and Rybelsus 3mg     Other medication(s) to be shipped:   Allopurinol  Amlodipine  Vitamin D      **Envarsus changed to 9mg  Daily     Frank Leach, DOB: [birth year]  Phone: [phone] (home)       All above HIPAA information was verified with patient.     Was a Nurse, learning disability used for this call? No    Completed refill call assessment today to schedule patient's medication shipment from the [facility] [phone]).       Specialty medication(s) and dose(s) confirmed: Envarsus changed to 9mg  Daily   Changes to medications: Lenin reports no changes at this time.  Changes to insurance: No  Questions for the pharmacist: No    Confirmed patient received Welcome Packet with first shipment. The patient will receive a drug information handout for each medication shipped and additional FDA Medication Guides as required.       DISEASE/MEDICATION-SPECIFIC INFORMATION        N/A    SPECIALTY MEDICATION ADHERENCE     Medication Adherence    Patient reported X missed doses in the last month: 0      Envarsus 1mg : 10 days worth of medication on hand.  Envarsus 4mg : 710 days worth of medication on hand.  mycophenolic acid 180mg : 5 days worth of medication on hand.  Rybelsus 3mg : 5 days worth of medication on hand.              SHIPPING     Shipping address confirmed in Epic.     Delivery Scheduled: Yes, Expected medication delivery date: 01/14/20.     Medication will be delivered via Same Day Courier to the prescription address in Epic WAM.    [place] A Malaky Tetrault   [facility] Specialty Technician

## 2020-01-13 NOTE — Unmapped (Signed)
Please call patient he would like to speak with [facility] nurse. Thanks

## 2020-01-14 DIAGNOSIS — Z940 Kidney transplant status: Principal | ICD-10-CM

## 2020-01-14 DIAGNOSIS — Z79899 Other long term (current) drug therapy: Principal | ICD-10-CM

## 2020-01-14 MED FILL — ERGOCALCIFEROL (VITAMIN D2) 1,250 MCG (50,000 UNIT) CAPSULE: 28 days supply | Qty: 4 | Fill #1 | Status: AC

## 2020-01-14 MED FILL — ALLOPURINOL 100 MG TABLET: 30 days supply | Qty: 30 | Fill #3 | Status: AC

## 2020-01-14 MED FILL — ENVARSUS XR 4 MG TABLET,EXTENDED RELEASE: 30 days supply | Qty: 60 | Fill #0 | Status: AC

## 2020-01-14 MED FILL — ENVARSUS XR 1 MG TABLET,EXTENDED RELEASE: 30 days supply | Qty: 60 | Fill #0 | Status: AC

## 2020-01-14 MED FILL — RYBELSUS 3 MG TABLET: 30 days supply | Qty: 30 | Fill #4 | Status: AC

## 2020-01-14 MED FILL — RYBELSUS 3 MG TABLET: ORAL | 30 days supply | Qty: 30 | Fill #4

## 2020-01-14 MED FILL — ENVARSUS XR 1 MG TABLET,EXTENDED RELEASE: 30 days supply | Qty: 60 | Fill #0

## 2020-01-14 MED FILL — AMLODIPINE 10 MG TABLET: 30 days supply | Qty: 30 | Fill #5 | Status: AC

## 2020-01-14 MED FILL — ENVARSUS XR 4 MG TABLET,EXTENDED RELEASE: 30 days supply | Qty: 60 | Fill #0

## 2020-01-14 MED FILL — ERGOCALCIFEROL (VITAMIN D2) 1,250 MCG (50,000 UNIT) CAPSULE: ORAL | 28 days supply | Qty: 4 | Fill #1

## 2020-01-14 MED FILL — MYCOPHENOLATE SODIUM 180 MG TABLET,DELAYED RELEASE: 30 days supply | Qty: 180 | Fill #6 | Status: AC

## 2020-01-14 MED FILL — MYCOPHENOLATE SODIUM 180 MG TABLET,DELAYED RELEASE: ORAL | 30 days supply | Qty: 180 | Fill #6

## 2020-01-14 MED FILL — AMLODIPINE 10 MG TABLET: ORAL | 30 days supply | Qty: 30 | Fill #5

## 2020-01-14 MED FILL — ALLOPURINOL 100 MG TABLET: ORAL | 30 days supply | Qty: 30 | Fill #3

## 2020-01-14 NOTE — Unmapped (Signed)
Verlon Au,    I spoke with [name], he stated that he has now tried 0.7 mL  He feels like his erection is at about 40 % with this amount.  Should he schedule an appointment to talk about other options or increase further?    Thanks,  Lanora Manis

## 2020-01-14 NOTE — Unmapped (Signed)
Phone busy.  Will try again tomorrow.

## 2020-01-15 ENCOUNTER — Encounter: Admit: 2020-01-15 | Discharge: 2020-01-16 | Payer: MEDICARE | Attending: Nephrology | Primary: Nephrology

## 2020-01-15 DIAGNOSIS — Z940 Kidney transplant status: Principal | ICD-10-CM

## 2020-01-15 DIAGNOSIS — I10 Essential (primary) hypertension: Principal | ICD-10-CM

## 2020-01-15 DIAGNOSIS — D849 Immunodeficiency, unspecified: Principal | ICD-10-CM

## 2020-01-15 DIAGNOSIS — Z79899 Other long term (current) drug therapy: Principal | ICD-10-CM

## 2020-01-15 LAB — URINALYSIS
BACTERIA: NONE SEEN /HPF
BILIRUBIN UA: NEGATIVE
BLOOD UA: NEGATIVE
GLUCOSE UA: NEGATIVE
LEUKOCYTE ESTERASE UA: NEGATIVE
NITRITE UA: NEGATIVE
PH UA: 5 (ref 5.0–9.0)
PROTEIN UA: NEGATIVE
RBC UA: <1 /HPF (ref <=3)
SPECIFIC GRAVITY UA: 1.016 (ref 1.003–1.030)
SQUAMOUS EPITHELIAL: <1 /HPF (ref 0–5)
UROBILINOGEN UA: 0.2
WBC UA: 1 /HPF (ref <=2)

## 2020-01-15 LAB — PROTEIN URINE: Protein:MCnc:Pt:Urine:Qn:: 9.6

## 2020-01-15 LAB — PROTEIN / CREATININE RATIO, URINE: PROTEIN URINE: 9.6 mg/dL

## 2020-01-15 LAB — BILIRUBIN UA: Bilirubin:PrThr:Pt:Urine:Ord:Test strip: NEGATIVE

## 2020-01-15 NOTE — Unmapped (Signed)
Transplant Coordinator, Clinic Visit   Pt seen today by transplant nephrology for follow up, reviewed medications and symptoms. Pt doing well today, accompanied by daughter Frank Leach.          01/15/20 1232   BP: 127/62   Pulse: 63   Temp: 36.6 ??C (97.9 ??F)   Weight: 74.6 kg (164 lb 6.4 oz)   PainSc: 0-No pain       Assessment  BP: 116-121/60s at home for past several days  BG: 130-140  Headache: denies  Hand tremors: denies  Numbness/tingling: denies  Fevers: denies  Chills/sweats: denies  Shortness of breath: denies  Chest pain or pressure: denies  Palpitations: denies  Nausea/vomiting: denies  Diarrhea/constipation: diarrhea 1 x per week  UTI symptoms: denies  Swelling: denies    Moderate appetite, eating 2 meals per day plus snacks; reports adequate hydration.     Intake: 40oz per week    Any new medications? no  Immunosuppressant last taken: 9am day before labs    Immunization status: UTD, has received both COVID-19 vaccines    Functional Score: 90     Able to carry on normal activity;  Minor signs or symptoms of disease.  Employment status is: retired    Per Dr. Lovena Neighbours, pt will get labs weekly x 2 weeks, if labs remain stable pt will move to labs q 2 weeks. Orders entered for labs on Monday to include CMV, BK and DSAs. Pt will be scheduled for follow up in 6 weeks with Dr. Nestor Lewandowsky at [place].     I spent a total of 15 minutes with Frank Leach reviewing medications and symptoms.

## 2020-01-15 NOTE — Unmapped (Signed)
Lab orders entered

## 2020-01-15 NOTE — Unmapped (Signed)
Transplant Nephrology Clinic Visit    Subjective/Interval:     Patient doing well,  Patient not demonstrating any symptoms of drug toxicity.  As of now patient with no acute issues, no new complaints, no fever chills or sweats. no chest pain palpitations orthopnea or shortness of breath. no lightheaded. no lower extremity edema. no abdominal pain no n/v/d. no myalgias or arthralgias. no dysuria hematuria or difficulty voiding.Other ros mostly negative, no hospital admission, no ED visits, no new physicians and no new medicines.     He is not having full appetite and seems to be eating less. Some weight loss is seen over the past 6 months. Last colonoscopy cxr was within limit. But patient has remote h/o rcc with b/l nephrectomies. No significant lymphadenopathy noted in the past imaging. Will continue to follow closely, if still poor appetite will use some stimulants.       Assessment:  69 y.o. 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       Recommendations/Plan:     Allograft Function: DDKT KDPI: 42%06/22/2019  Renal function holding relatively stable with electrolytes and acid base balanced.     Baseline Cr: ~ 1.8 (zero hour biopsy Mild to moderate arteriosclerosis)  Last Cr:  1.0 Date:  01/12/20 (recent spike in tacro)     [place]oy/ BK     neg Date:  April 2021  DSA     neg Date:   April 2021  CMV:     Pos+ Date:   April 2021 (cmv log10 2.85)   UPC:     0.03 Date:   April 2021    ?? Patient still remain steady with Cr despite in the higher range   ?? He has high KDPI with moderate sclerosis on zero hour biopsy   ?? Cr rarely fluctuating to 2.0 region but corresponding tac lvls were high as well   ?? Will continue to follow, if Cr trends any higher than 2.0 region will obtain biopsy   ?? If not since zero protein and zero DSA with 0 pra will continue to follow.   ?? Labs weekly for 2 more weeks. If Cr and tacro stable change to every 2 weeks      Immunosuppression Management [High Risk Medical Decision Making For Drug Therapy Requiring Intensive Monitoring For Toxicity]:     Tacro target: 6-8     Tacro dose: 9 mg   Tacro last lvl /date: 7.8 (01/12/20)  MMF/MPA dose : 540 bid   Wbc/anc count: 11.9/5.7   Side effects: none    ?? Will reduce tac goal to 6-8 since almost 8 months post Tx  ?? Reducing IS due to mild rise in CMV titer as well   ?? Repeat CMV titer this visit   ?? Obtain DSA lvl as well to ensure it remains negative   ?? Follow CMV titers and if further rise will drop myfortic to 360     Blood Pressure Management:BP well controlled on  Coreg to 12.50 mg BID.      Lipid Management: Last lipid panel on 08/20/2018.  The 10-year ASCVD risk score Denman George DC Jr., et al., 2013) is: 36.3% /  Currently taking atorvastatin . Continue the same     Electrolytes: Electrolyte and metabolic parameters in acceptable range.  Magnesium continues to be low normal despite high supplement. continue magnesium supplements to TID.  Ergocalciferol to be continued.   ??  Obtain PTH for completion of work up   ?? Vitamin D lvl     Infectious Prophylaxis and Monitoring:  CMV D+/R+  EBV D+/R+  The patient finished Valcyte Sep 22 2019  and Bactrim will end 12/20/19 prophylaxis.   Repeat BK/ CMV     Gout: No flares recently.  Continue on Allopurinol.     RCC s/p bilateral nephrectomy:   Last CT RMP 08/29/2018 showed no suspicious enhancing lesions.   ?? 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     Health Maintenance:   Colonoscopy: 04/30/2018 - 1 adenomatous polyp; repeat 5-10 years  CT scan repeat to be scheduled before next appointment due to rcc history and some residual LN     Immunizations:   Flu Shot: 04/30/2019  Prevnar 13: 10/30/2013  Pneumovax: 04/24/2016, due 2022  No immunizations till 1 year post transplant.  Finished covid vaccines           History of Present Illness    69 y.o. male here for follow up after kidney transplantation.  Patient has ESRD secondary to HTN.  Patient has been on dialysis since 01/25/2012.  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 Patient was admitted for kidney transplant on 06/21/2019.  Patient experienced hyperkalemia that despite medical management would not decrease.  2 hours of HD performed.  Patient discharged 06/26/2019.       Transplant History:    Organ Received: Left DDKT, DBD, PHS, KDPI: 42%; 21h cold ischemia  Native Kidney Disease: HTN; cPRA: 0%  Date of Transplant: 06/22/2019  Post-Transplant Course: HD once for high potassium  Prior Transplants: none  Induction: [place]  Date of Ureteral Stent Removal: 07/29/2019  CMV/EBV Status: CMV D+/R+  EBV D+/R+  Rejection Episodes: none  Donor Specific Antibodies: none  Results of Renal Imaging (pre and post):   Pre-Txp 08/29/2018 CT RMP  Sequela of bilateral nephrectomy. Interval decrease in linear soft tissue within the right nephrectomy bed, potentially postsurgical or fat necrosis. Unchanged linear tissue within the left nephrectomy bed. No suspicious enhancing lesions are visualized within the surgical beds    Post-Txp 06/25/2019 (txp kidney only)  The renal transplant was located in the left lower quadrant. Normal size and echogenicity.  No solid masses or calculi. Trace perinephric fluid adjacent to the lower pole of the kidney. Mild pelviectasis  - Perfusion: Using power Doppler, normal perfusion was seen throughout the renal parenchyma.  - Resistive indices in the renal transplant are stable compared with prior examination.  - Main renal artery/iliac artery: Patent. Again noted are 3 renal arteries. Resistive indices within the renal arteries are stable to minimally increased, now at or just above normal limits.  - Main renal vein/iliac vein: Patent    Past Medical History  1. HTN  2. DM II  3. RCC s/p Bilateral nephrectomy (L 04/2016, R 12/2016)  4. GERD  5. HLD  6. 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    Medications  Current Outpatient Medications   Medication Sig Dispense Refill   ??? acetaminophen (TYLENOL) 500  MG tablet Take 1-2 tablets (500-1,000 mg total) by mouth every six (6) hours as needed for pain or fever (> 38C). (Patient not taking: Reported on 10/27/2019) 100 tablet 0   ??? allopurinoL (ZYLOPRIM) 100 MG tablet Take 1 tablet (100 mg total) by mouth every evening. 30 tablet 11   ??? amLODIPine (NORVASC) 10 MG tablet Take 1 tablet (10 mg total) by mouth daily. 30 tablet 11   ??? aspirin (ECOTRIN) 81 MG tablet Take 1 tablet (81 mg total) by mouth daily. 30 tablet 11   ??? atorvastatin (LIPITOR) 40 MG tablet Take 1 tablet (40 mg total) by mouth daily. 90 tablet 3   ??? carvediloL (COREG) 6.25 MG tablet Take 2 tablets (12.5 mg total) by mouth Two (2) times a day. 360 tablet 3   ??? ENVARSUS XR 1 mg Tb24 extended release tablet Take 2 (4 mg) tablets and 1 (1 mg) tablet for total daily dose of 9 mg 60 tablet 11   ??? ENVARSUS XR 4 mg Tb24 extended release tablet Take 2 (4 mg) tablets and 1 (1 mg) tablet for total daily dose of 9 mg 60 tablet 11   ??? ergocalciferol-1,250 mcg, 50,000 unit, (DRISDOL) 1,250 mcg (50,000 unit) capsule Take 1 capsule (50,000 Units total) by mouth once a week. 4 capsule 1   ??? magnesium oxide-Mg AA chelate (MAGNESIUM, AMINO ACID CHELATE,) 133 mg Tab Take 3 tablets by mouth Three (3) times a day. 810 tablet 3   ??? metFORMIN (GLUCOPHAGE-XR) 500 MG 24 hr tablet Take 2 tablets (1,000 mg total) by mouth once daily. 180 tablet 3   ??? mycophenolate (MYFORTIC) 180 MG EC tablet Take 3 tablets (540 mg total) by mouth Two (2) times a day. 180 tablet 11   ??? omeprazole (PRILOSEC) 40 MG capsule Take 1 capsule (40 mg total) by mouth every other day. 45 capsule 3   ??? papaverine 16.7 mg/mL phentolamine 0.56 mg/mL alprostadil 5.6 mcg/mL injection Inject as directed by physician, starting with 0.1 mL. May increase by 0.1 ml with next dose if not effective. 5 mL 11   ??? semaglutide 3 mg Tab Take 1 tablet (3 mg) by mouth daily. 90 tablet 3   ??? sildenafiL (VIAGRA) 50 MG tablet Take 1 tablet (50 mg total) by mouth nightly as needed for erectile dysfunction. 20 tablet 1   ??? sodium polystyrene, SPS, with sorbitol (SPS) 15-20 gram/60 mL Susp Take 60 ml (30 grams) by mouth today and 60 ml (30 grams) by mouth tomorrow 230 mL 0     Current Facility-Administered Medications   Medication Dose Route Frequency Provider Last Rate Last Admin   ??? sodium chloride bacteriostatic 0.9 % injection 0.1 mL  0.1 mL Injection Once Sylvan Cheese, [place]               Physical Exam  There were no vitals taken for this visit.  General: no acute distress  HEENT: wearing mask, PERRL  Neck: neck supple, no cervical lymphadenopathy appreciated  CV: normal rate, normal rhythm, no murmur, no gallops, no rubs appreciated  Lungs: clear to auscultation bilaterally  Abdomen: soft, non tender, incision c/d/i; no erythema or edema noted  Extremities:  no edema, L forearm fistula good thrill   Musculoskeletal: no visible deformity, normal range of motion.  Pulses: intact distally throughout  Neurologic: awake, alert, and oriented x3    Laboratory Data and Imaging reviewed in EPIC    Counseling:  I counseled the patient on:  The need  to avoid sun exposure and the use of sunblock while outdoors given the relatively higher risk of skin malignancy in an immunosuppressed state.  The need for adherence to immunosuppression medication.  Patient verbalized understanding.     Follow-Up:  Return to clinic in 7 weeks   Labs: twice a week  Patient will continue to follow-up with his primary care provider for non-transplant related issues and medication refills. We have ordered transplant specific labs per the center's guidelines to monitor and assess for toxicities from immunosuppressant drug therapy

## 2020-01-15 NOTE — Unmapped (Signed)
Left message for patient to return call.

## 2020-01-15 NOTE — Unmapped (Signed)
Urine collected and sent to lab.

## 2020-01-15 NOTE — Unmapped (Signed)
Patient would like to speak with Lorbacher's nurse.       Thanks

## 2020-01-16 NOTE — Unmapped (Signed)
Patient notified he could increase trimix to 0.8 mLs and let us know the outcome.  He verbalized understanding.

## 2020-01-19 ENCOUNTER — Encounter: Admit: 2020-01-19 | Discharge: 2020-01-20 | Payer: MEDICARE

## 2020-01-19 LAB — CBC W/ AUTO DIFF
BASOPHILS ABSOLUTE COUNT: 0.1 10*9/L (ref 0.0–0.1)
BASOPHILS RELATIVE PERCENT: 1 %
HEMOGLOBIN: 10.3 g/dL — ABNORMAL LOW (ref 13.5–17.5)
LYMPHOCYTES ABSOLUTE COUNT: 0.1 10*9/L — ABNORMAL LOW (ref 0.7–4.0)
LYMPHOCYTES RELATIVE PERCENT: 1.8 %
MEAN CORPUSCULAR HEMOGLOBIN CONC: 31.9 g/dL (ref 30.0–36.0)
MEAN CORPUSCULAR HEMOGLOBIN: 28.4 pg (ref 26.0–34.0)
MEAN CORPUSCULAR VOLUME: 89 fL (ref 81.0–95.0)
MEAN PLATELET VOLUME: 8.1 fL (ref 7.0–10.0)
MONOCYTES ABSOLUTE COUNT: 0.2 10*9/L (ref 0.1–1.0)
MONOCYTES RELATIVE PERCENT: 4.1 %
NEUTROPHILS ABSOLUTE COUNT: 4.8 10*9/L (ref 1.7–7.7)
NEUTROPHILS RELATIVE PERCENT: 88.5 %
NUCLEATED RED BLOOD CELLS: 0 /100{WBCs} (ref <=4)
PLATELET COUNT: 235 10*9/L (ref 150–450)
RED BLOOD CELL COUNT: 3.64 10*12/L — ABNORMAL LOW (ref 4.32–5.72)
RED CELL DISTRIBUTION WIDTH: 18 % — ABNORMAL HIGH (ref 12.0–15.0)
WBC ADJUSTED: 5.5 10*9/L (ref 3.5–10.5)

## 2020-01-19 LAB — COMPREHENSIVE METABOLIC PANEL
ALBUMIN: 4 g/dL (ref 3.4–5.0)
ALKALINE PHOSPHATASE: 82 U/L (ref 46–116)
ALT (SGPT): 11 U/L (ref 10–49)
AST (SGOT): 18 U/L (ref <34)
BLOOD UREA NITROGEN: 30 mg/dL — ABNORMAL HIGH (ref 9–23)
BUN / CREAT RATIO: 14
CALCIUM: 9.9 mg/dL (ref 8.7–10.4)
CHLORIDE: 114 mmol/L — ABNORMAL HIGH (ref 98–107)
CO2: 21.9 mmol/L (ref 20.0–31.0)
CREATININE: 2.19 mg/dL — ABNORMAL HIGH (ref 0.60–1.10)
EGFR CKD-EPI AA MALE: 35 mL/min/{1.73_m2}
EGFR CKD-EPI NON-AA MALE: 30 mL/min/{1.73_m2}
GLUCOSE RANDOM: 97 mg/dL (ref 70–179)
POTASSIUM: 5.3 mmol/L — ABNORMAL HIGH (ref 3.5–5.1)
PROTEIN TOTAL: 6.6 g/dL (ref 5.7–8.2)
SODIUM: 140 mmol/L (ref 135–145)

## 2020-01-19 LAB — MONOCYTES ABSOLUTE COUNT: Monocytes:NCnc:Pt:Bld:Qn:Automated count: 0.2

## 2020-01-19 LAB — CMV DNA, QUANTITATIVE, PCR: CMV QUANT LOG10: 5.8 {Log_IU}/mL — ABNORMAL HIGH (ref <0.00)

## 2020-01-19 LAB — PHOSPHORUS: Phosphate:MCnc:Pt:Ser/Plas:Qn:: 3.1

## 2020-01-19 LAB — CMV QUANT: Lab: 634562 — ABNORMAL HIGH

## 2020-01-19 LAB — TACROLIMUS, TROUGH: Lab: 7.2

## 2020-01-19 LAB — CREATININE: Creatinine:MCnc:Pt:Ser/Plas:Qn:: 2.19 — ABNORMAL HIGH

## 2020-01-19 LAB — MAGNESIUM: Magnesium:MCnc:Pt:Ser/Plas:Qn:: 1.9

## 2020-01-20 DIAGNOSIS — Z940 Kidney transplant status: Principal | ICD-10-CM

## 2020-01-20 DIAGNOSIS — Z79899 Other long term (current) drug therapy: Principal | ICD-10-CM

## 2020-01-20 MED ORDER — MYCOPHENOLATE SODIUM 180 MG TABLET,DELAYED RELEASE
ORAL_TABLET | Freq: Two times a day (BID) | ORAL | 11 refills | 30 days | Status: CP
Start: 2020-01-20 — End: 2021-01-19
  Filled 2020-02-10: qty 60, 30d supply, fill #0

## 2020-01-20 MED ORDER — VALGANCICLOVIR 450 MG TABLET
ORAL_TABLET | Freq: Two times a day (BID) | ORAL | 5 refills | 30.00000 days | Status: CP
Start: 2020-01-20 — End: ?
  Filled 2020-01-20: qty 60, 30d supply, fill #0

## 2020-01-20 MED FILL — VALGANCICLOVIR 450 MG TABLET: 30 days supply | Qty: 60 | Fill #0 | Status: AC

## 2020-01-20 NOTE — Unmapped (Signed)
error 

## 2020-01-20 NOTE — Unmapped (Signed)
6/15: pt has no valganciclovir left from previous fills. He is aware a referral has been entered for assistance for future fills, but is ok with the $38.71 price on his charge account this month -ef    The following medication is onboarded in this note:  1. Valganciclovir    [facility]   Patient Onboarding/Medication Counseling    Frank Leach is a 69 y.o. male with kidney transplant who I am counseling today on re-starting of therapy.  I am speaking to the patient.    Was a Nurse, learning disability used for this call? No    Verified patient's date of birth / HIPAA.    Specialty medication(s) to be sent: Transplant: valgancyclovir 450mg       Non-specialty medications/supplies to be sent: na      Medications not needed at this time: na         Valcyte (valganciclovir)    Medication & Administration     Dosage:   ??? Take 1 tablet (450mg  total) by mouth twice daily    Administration:   ??? Take with food  ??? Swallow the pills whole, do not break, crush, or chew    Adherence/Missed dose instructions:  ??? Take a missed dose as soon as you think about it with food  ??? If it is close to your next dose, skip the missed dose and go back to your normal time.  ??? Do not take 2 doses at the same time or extra doses.  ??? Report any missed doses to coordinator    Goals of Therapy     ??? To prevent or treat CMV infection in setting of solid organ transplant    Side Effects & Monitoring Parameters     ??? Common side effects  ??? Headache  ??? Diarrhea or constipation  ??? Appetite or sleep disturbances  ??? Back, muscle, joint, or belly pain  ??? Weight loss  ??? Dizziness  ??? Muscle spasm  ??? Upset stomach or vomiting    ??? The following side effects should be reported to the provider:  ??? Allergic reaction  (rash, hives, swelling, blistered or peeling skin, shortness of breath)  ??? Infection (fever, chills, sore throat, ear/sinus pain, cough, sputum change, urinary pain, mouth sores, non-healing wounds)  ??? Bleeding (cough ground vomit, blood in urine, black/red/tarry stools, unexplained bruising or bleeding)  ??? Electrolyte problems (mood changes, confusion, weakness, abnormal heartbeat, seizures)  ??? Kidney problems (urine changes, weight gain)  ??? Yellowing skin or eyes  ??? Swelling in arms, legs, stomach  ??? Severe dizziness or passing out  ??? Eye issues (eyesight changes, pain, or irritation)  ??? Night sweats    ??? Monitoring parameters  ??? Have eye exam as directed by doctor  ??? CMV counts  ??? CBC  ??? Renal function  ??? Pregnancy test prior to initiation    Contraindications, Warnings, & Precautions     ??? BBW: severe leukopenia, neutropenia, anemia, thrombocytopenia, pancytopenia, and bone marrow failure, including aplastic anemia have been reported  ??? BBW: may cause temporary or permanent inhibition of spermatogenesis and suppression of fertilty; has the potential to cause birth defects and cancers in humans  ??? Male patients should have pregnancy test prior to initiation and use birth control for at least 30 days after discontinuation  ??? Male patients should use a barrier contraceptive while on therapy and for 90 days after discontinuation  ??? Acute renal failure  ??? Not indicated for use in liver transplant recipients  ???  Breastfeeding is not recommended    Drug/Food Interactions     ??? Medication list reviewed in Epic. The patient was instructed to inform the care team before taking any new medications or supplements. no interactions noted that clinic is not already monitoring.   ??? Check with your doctor before getting any vaccinations (live or inactivated)    Storage, Handling Precautions, & Disposal     ??? Store at room temperature  ??? Keep away from children and pets      Current Medications (including OTC/herbals), Comorbidities and Allergies     Current Outpatient Medications   Medication Sig Dispense Refill   ??? acetaminophen (TYLENOL) 500 MG tablet Take 1-2 tablets (500-1,000 mg total) by mouth every six (6) hours as needed for pain or fever (> 38C). (Patient not taking: Reported on 10/27/2019) 100 tablet 0   ??? allopurinoL (ZYLOPRIM) 100 MG tablet Take 1 tablet (100 mg total) by mouth every evening. 30 tablet 11   ??? amLODIPine (NORVASC) 10 MG tablet Take 1 tablet (10 mg total) by mouth daily. 30 tablet 11   ??? aspirin (ECOTRIN) 81 MG tablet Take 1 tablet (81 mg total) by mouth daily. 30 tablet 11   ??? atorvastatin (LIPITOR) 40 MG tablet Take 1 tablet (40 mg total) by mouth daily. 90 tablet 3   ??? carvediloL (COREG) 6.25 MG tablet Take 2 tablets (12.5 mg total) by mouth Two (2) times a day. 360 tablet 3   ??? ENVARSUS XR 1 mg Tb24 extended release tablet Take 2 (4 mg) tablets and 1 (1 mg) tablet for total daily dose of 9 mg 60 tablet 11   ??? ENVARSUS XR 4 mg Tb24 extended release tablet Take 2 (4 mg) tablets and 1 (1 mg) tablet for total daily dose of 9 mg 60 tablet 11   ??? ergocalciferol-1,250 mcg, 50,000 unit, (DRISDOL) 1,250 mcg (50,000 unit) capsule Take 1 capsule (50,000 Units total) by mouth once a week. 4 capsule 1   ??? magnesium oxide-Mg AA chelate (MAGNESIUM, AMINO ACID CHELATE,) 133 mg Tab Take 3 tablets by mouth Three (3) times a day. 810 tablet 3   ??? metFORMIN (GLUCOPHAGE-XR) 500 MG 24 hr tablet Take 2 tablets (1,000 mg total) by mouth once daily. 180 tablet 3   ??? mycophenolate (MYFORTIC) 180 MG EC tablet Take 1 tablet (180 mg total) by mouth Two (2) times a day. 60 tablet 11   ??? omeprazole (PRILOSEC) 40 MG capsule Take 1 capsule (40 mg total) by mouth every other day. 45 capsule 3   ??? papaverine 16.7 mg/mL phentolamine 0.56 mg/mL alprostadil 5.6 mcg/mL injection Inject as directed by physician, starting with 0.1 mL. May increase by 0.1 ml with next dose if not effective. 5 mL 11   ??? semaglutide 3 mg Tab Take 1 tablet (3 mg) by mouth daily. 90 tablet 3   ??? sildenafiL (VIAGRA) 50 MG tablet Take 1 tablet (50 mg total) by mouth nightly as needed for erectile dysfunction. 20 tablet 1   ??? sodium polystyrene, SPS, with sorbitol (SPS) 15-20 gram/60 mL Susp Take 60 ml (30 grams) by mouth today and 60 ml (30 grams) by mouth tomorrow 230 mL 0   ??? valGANciclovir (VALCYTE) 450 mg tablet Take 1 tablet (450 mg total) by mouth Two (2) times a day. 60 tablet 5     Current Facility-Administered Medications   Medication Dose Route Frequency Provider Last Rate Last Admin   ??? sodium chloride bacteriostatic 0.9 % injection 0.1 mL  0.1 mL Injection Once Sylvan Cheese, [place]           No Known Allergies    Patient Active Problem List   Diagnosis   ??? ESRD (end stage renal disease) (CMS-HCC)   ??? Hyperlipidemia   ??? H/O splenectomy   ??? Diabetes mellitus (CMS-HCC)   ??? HTN (hypertension)   ??? Bilateral renal masses   ??? History of unilateral nephrectomy   ??? Delirium, acute   ??? 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 Kidney replaced by transplant   ??? Immunosuppression (CMS-HCC)       Reviewed and up to date in Epic.    Appropriateness of Therapy     Is medication and dose appropriate based on diagnosis? Yes    Prescription has been clinically reviewed: Yes    Baseline Quality of Life Assessment      How many days over the past month did your transplant  keep you from your normal activities? For example, brushing your teeth or getting up in the morning. 0    Financial Information     Medication Assistance provided: assistance is in process per referrals    Anticipated copay of $38.71/30ds on insurance reviewed with patient. Verified delivery address.    Delivery Information     Scheduled delivery date: 01/20/2020    Expected start date: pt will start when med arrives on 6/15    Medication will be delivered via UPS to the prescription address in [facility].  This shipment will not require a signature.      Explained the services we provide at [facility] and that each month we would call to set up refills.  Stressed importance of returning phone calls so that we could ensure they receive their medications in time each month.  Informed patient that we should be setting up refills 7-10 days prior to when they will run out of medication.  A pharmacist will reach out to perform a clinical assessment periodically.  Informed patient that a welcome packet and a drug information handout will be sent.      Patient verbalized understanding of the above information as well as how to contact the pharmacy at [phone] option 4 with any questions/concerns.  The pharmacy is open Monday through Friday 8:30am-4:30pm.  A pharmacist is available 24/7 via pager to answer any clinical questions they may have.    Patient Specific Needs     - Does the patient have any physical, cognitive, or cultural barriers? No    - Patient prefers to have medications discussed with  Patient     - Is the patient or caregiver able to read and understand education materials at a high school level or above? Yes    - Patient's primary language is  English     - Is the patient high risk? Yes, patient is taking a REMS drug. Medication is dispensed in compliance with REMS program.     - Does the patient require a Care Management Plan? No     - Does the patient require physician intervention or other additional services (i.e. nutrition, smoking cessation, social work)? No      Frank Leach  [facility] Specialty Pharmacist

## 2020-01-20 NOTE — Unmapped (Signed)
CMV PCR is [phone], [phone].  Per Dr. Lovena Neighbours, start Valcyte 450 mg BID, hold Myfortic for 2 weeks then restart at 180 mg BID. Weekly CMV titers.

## 2020-01-20 NOTE — Unmapped (Signed)
6/15: speaking with patient today re: dose change mycophenolate and restarting valganciclovir -Massie Maroon    [facility] Clinical Assessment & Refill Coordination Note    Frank Leach, DOB: 1951-03-11  Phone: [phone] (home)     All above HIPAA information was verified with patient.     Was a Nurse, learning disability used for this call? No    Specialty Medication(s):   Transplant: Envarsus 1mg , Envarsus 4mg ,  mycophenolic acid 180mg  and valgancyclovir 450mg      Current Outpatient Medications   Medication Sig Dispense Refill   ??? acetaminophen (TYLENOL) 500 MG tablet Take 1-2 tablets (500-1,000 mg total) by mouth every six (6) hours as needed for pain or fever (> 38C). (Patient not taking: Reported on 10/27/2019) 100 tablet 0   ??? allopurinoL (ZYLOPRIM) 100 MG tablet Take 1 tablet (100 mg total) by mouth every evening. 30 tablet 11   ??? amLODIPine (NORVASC) 10 MG tablet Take 1 tablet (10 mg total) by mouth daily. 30 tablet 11   ??? aspirin (ECOTRIN) 81 MG tablet Take 1 tablet (81 mg total) by mouth daily. 30 tablet 11   ??? atorvastatin (LIPITOR) 40 MG tablet Take 1 tablet (40 mg total) by mouth daily. 90 tablet 3   ??? carvediloL (COREG) 6.25 MG tablet Take 2 tablets (12.5 mg total) by mouth Two (2) times a day. 360 tablet 3   ??? ENVARSUS XR 1 mg Tb24 extended release tablet Take 2 (4 mg) tablets and 1 (1 mg) tablet for total daily dose of 9 mg 60 tablet 11   ??? ENVARSUS XR 4 mg Tb24 extended release tablet Take 2 (4 mg) tablets and 1 (1 mg) tablet for total daily dose of 9 mg 60 tablet 11   ??? ergocalciferol-1,250 mcg, 50,000 unit, (DRISDOL) 1,250 mcg (50,000 unit) capsule Take 1 capsule (50,000 Units total) by mouth once a week. 4 capsule 1   ??? magnesium oxide-Mg AA chelate (MAGNESIUM, AMINO ACID CHELATE,) 133 mg Tab Take 3 tablets by mouth Three (3) times a day. 810 tablet 3   ??? metFORMIN (GLUCOPHAGE-XR) 500 MG 24 hr tablet Take 2 tablets (1,000 mg total) by mouth once daily. 180 tablet 3   ??? mycophenolate (MYFORTIC) 180 MG EC tablet Take 1 tablet (180 mg total) by mouth Two (2) times a day. 60 tablet 11   ??? omeprazole (PRILOSEC) 40 MG capsule Take 1 capsule (40 mg total) by mouth every other day. 45 capsule 3   ??? papaverine 16.7 mg/mL phentolamine 0.56 mg/mL alprostadil 5.6 mcg/mL injection Inject as directed by physician, starting with 0.1 mL. May increase by 0.1 ml with next dose if not effective. 5 mL 11   ??? semaglutide 3 mg Tab Take 1 tablet (3 mg) by mouth daily. 90 tablet 3   ??? sildenafiL (VIAGRA) 50 MG tablet Take 1 tablet (50 mg total) by mouth nightly as needed for erectile dysfunction. 20 tablet 1   ??? sodium polystyrene, SPS, with sorbitol (SPS) 15-20 gram/60 mL Susp Take 60 ml (30 grams) by mouth today and 60 ml (30 grams) by mouth tomorrow 230 mL 0   ??? valGANciclovir (VALCYTE) 450 mg tablet Take 1 tablet (450 mg total) by mouth Two (2) times a day. 60 tablet 5     Current Facility-Administered Medications   Medication Dose Route Frequency Provider Last Rate Last Admin   ??? sodium chloride bacteriostatic 0.9 % injection 0.1 mL  0.1 mL Injection Once Sylvan Cheese, [place]  Changes to medications: mycophen now 1bid and restarting valcyte    No Known Allergies    Changes to allergies: No    SPECIALTY MEDICATION ADHERENCE     envarsus 1mg   : 29 days of medicine on hand   envarsus 4mg   : 28 days of medicine on hand   mycophenolate 180mg   : 30 days of medicine on hand   valganciclovir 450mg   : 0 days of medicine on hand     Medication Adherence    Patient reported X missed doses in the last month: 0  Specialty Medication: valganciclovir 450mg   Patient is on additional specialty medications: Yes  Additional Specialty Medications: envarsus 1mg   Patient Reported Additional Medication X Missed Doses in the Last Month: 0  Patient is on more than two specialty medications: Yes  Specialty Medication: envarsus 4mg   Patient Reported Additional Medication X Missed Doses in the Last Month: 0  Specialty Medication: mycophenolate 180mg   Patient Reported Additional Medication X Missed Doses in the Last Month: 0          Specialty medication(s) dose(s) confirmed: Patient reports changes to the regimen as follows: mycophenolate is now 180mg  bid and restarting valcyte at 450mg  bid     Are there any concerns with adherence? No    Adherence counseling provided? Not needed    CLINICAL MANAGEMENT AND INTERVENTION      Clinical Benefit Assessment:    Do you feel the medicine is effective or helping your condition? Yes    Clinical Benefit counseling provided? Not needed    Adverse Effects Assessment:    Are you experiencing any side effects? No    Are you experiencing difficulty administering your medicine? No    Quality of Life Assessment:    How many days over the past month did your transplant  keep you from your normal activities? For example, brushing your teeth or getting up in the morning. 0    Have you discussed this with your provider? Not needed    Therapy Appropriateness:    Is therapy appropriate? Yes, therapy is appropriate and should be continued    DISEASE/MEDICATION-SPECIFIC INFORMATION      N/A    PATIENT SPECIFIC NEEDS     - Does the patient have any physical, cognitive, or cultural barriers? No    - Is the patient high risk? Yes, patient is taking a REMS drug. Medication is dispensed in compliance with REMS program.     - Does the patient require a Care Management Plan? No     - Does the patient require physician intervention or other additional services (i.e. nutrition, smoking cessation, social work)? No      SHIPPING     Specialty Medication(s) to be Shipped:   Transplant: valgancyclovir 450mg     Other medication(s) to be shipped: na  Pt wants call back in 2 weeks on all other meds     Changes to insurance: No    Delivery Scheduled: Yes, Expected medication delivery date: 01/21/2020.     Medication will be delivered via UPS to the confirmed prescription address in [facility].    The patient will receive a drug information handout for each medication shipped and additional FDA Medication Guides as required.  Verified that patient has previously received a Conservation officer, historic buildings.    All of the patient's questions and concerns have been addressed.    Thad Ranger   [facility] Specialty Pharmacist

## 2020-01-21 LAB — BK VIRUS QUANTITATIVE PCR, BLOOD

## 2020-01-21 LAB — BK BLOOD RESULT: Lab: NOT DETECTED

## 2020-01-26 ENCOUNTER — Non-Acute Institutional Stay: Admit: 2020-01-26 | Discharge: 2020-01-27 | Payer: MEDICARE

## 2020-01-26 LAB — COMPREHENSIVE METABOLIC PANEL
ALBUMIN: 3.8 g/dL (ref 3.4–5.0)
ALKALINE PHOSPHATASE: 80 U/L (ref 46–116)
ALT (SGPT): 9 U/L — ABNORMAL LOW (ref 10–49)
ANION GAP: 5 mmol/L (ref 3–11)
AST (SGOT): 16 U/L (ref <34)
BILIRUBIN TOTAL: 0.5 mg/dL (ref 0.3–1.2)
BLOOD UREA NITROGEN: 28 mg/dL — ABNORMAL HIGH (ref 9–23)
BUN / CREAT RATIO: 14
CHLORIDE: 114 mmol/L — ABNORMAL HIGH (ref 98–107)
CO2: 22.5 mmol/L (ref 20.0–31.0)
CREATININE: 2.06 mg/dL — ABNORMAL HIGH (ref 0.60–1.10)
EGFR CKD-EPI AA MALE: 37 mL/min/{1.73_m2}
EGFR CKD-EPI NON-AA MALE: 32 mL/min/{1.73_m2}
GLUCOSE RANDOM: 97 mg/dL (ref 70–179)
PROTEIN TOTAL: 6.5 g/dL (ref 5.7–8.2)
SODIUM: 141 mmol/L (ref 135–145)

## 2020-01-26 LAB — CBC W/ AUTO DIFF
BASOPHILS ABSOLUTE COUNT: 0.1 10*9/L (ref 0.0–0.1)
BASOPHILS RELATIVE PERCENT: 1.3 %
EOSINOPHILS ABSOLUTE COUNT: 0.4 10*9/L (ref 0.0–0.7)
EOSINOPHILS RELATIVE PERCENT: 6.3 %
HEMATOCRIT: 31.7 % — ABNORMAL LOW (ref 38.0–50.0)
HEMOGLOBIN: 10.1 g/dL — ABNORMAL LOW (ref 13.5–17.5)
LYMPHOCYTES ABSOLUTE COUNT: 0.1 10*9/L — ABNORMAL LOW (ref 0.7–4.0)
LYMPHOCYTES RELATIVE PERCENT: 2 %
MEAN CORPUSCULAR HEMOGLOBIN CONC: 31.8 g/dL (ref 30.0–36.0)
MEAN CORPUSCULAR HEMOGLOBIN: 28.4 pg (ref 26.0–34.0)
MEAN CORPUSCULAR VOLUME: 89.5 fL (ref 81.0–95.0)
MONOCYTES ABSOLUTE COUNT: 0.1 10*9/L (ref 0.1–1.0)
MONOCYTES RELATIVE PERCENT: 2.3 %
NEUTROPHILS ABSOLUTE COUNT: 5.4 10*9/L (ref 1.7–7.7)
NEUTROPHILS RELATIVE PERCENT: 88.1 %
NUCLEATED RED BLOOD CELLS: 0 /100{WBCs} (ref <=4)
PLATELET COUNT: 245 10*9/L (ref 150–450)
RED BLOOD CELL COUNT: 3.54 10*12/L — ABNORMAL LOW (ref 4.32–5.72)
RED CELL DISTRIBUTION WIDTH: 18.3 % — ABNORMAL HIGH (ref 12.0–15.0)

## 2020-01-26 LAB — MONOCYTES RELATIVE PERCENT: Monocytes/100 leukocytes:NFr:Pt:Bld:Qn:Automated count: 2.3

## 2020-01-26 LAB — TACROLIMUS BLOOD: Lab: 11.3

## 2020-01-26 LAB — SMEAR REVIEW

## 2020-01-26 LAB — MAGNESIUM: Magnesium:MCnc:Pt:Ser/Plas:Qn:: 2

## 2020-01-26 LAB — SLIDE REVIEW

## 2020-01-26 LAB — BLOOD UREA NITROGEN: Urea nitrogen:MCnc:Pt:Ser/Plas:Qn:: 28 — ABNORMAL HIGH

## 2020-01-28 DIAGNOSIS — Z940 Kidney transplant status: Principal | ICD-10-CM

## 2020-01-28 LAB — HLA DS POST TRANSPLANT
ANTI-DONOR DRW #1 MFI: 225 MFI
ANTI-DONOR HLA-A #1 MFI: 163 MFI
ANTI-DONOR HLA-A #2 MFI: 27 MFI
ANTI-DONOR HLA-B #1 MFI: 38 MFI
ANTI-DONOR HLA-B #2 MFI: 67 MFI
ANTI-DONOR HLA-C #1 MFI: 144 MFI
ANTI-DONOR HLA-C #2 MFI: 161 MFI
ANTI-DONOR HLA-DQB #1 MFI: 376 MFI
ANTI-DONOR HLA-DR #1 MFI: 132 MFI

## 2020-01-28 LAB — HLA CL1 ANTIBODY COMM: Lab: 0

## 2020-01-28 LAB — BK VIRUS QUANTITATIVE PCR, BLOOD: BK BLOOD RESULT: NOT DETECTED

## 2020-01-28 LAB — CMV DNA, QUANTITATIVE, PCR: CMV QUANT LOG10: 5.11 {Log_IU}/mL — ABNORMAL HIGH (ref <0.00)

## 2020-01-28 LAB — FSAB CLASS 2 ANTIBODY SPECIFICITY: HLA CL2 AB RESULT: POSITIVE

## 2020-01-28 LAB — HLA CL2 AB COMMENT: Lab: 0

## 2020-01-28 LAB — CMV COMMENT: Lab: 0

## 2020-01-28 LAB — BK BLOOD COMMENT: Lab: 0

## 2020-01-28 LAB — DONOR HLA-B ANTIGEN #1

## 2020-01-28 MED ORDER — ENVARSUS XR 1 MG TABLET,EXTENDED RELEASE
ORAL_TABLET | 11 refills | 0 days | Status: CP
Start: 2020-01-28 — End: ?

## 2020-01-28 MED ORDER — ENVARSUS XR 4 MG TABLET,EXTENDED RELEASE
ORAL_TABLET | 11 refills | 0 days | Status: CP
Start: 2020-01-28 — End: ?

## 2020-01-28 NOTE — Unmapped (Signed)
Per Dr. Lovena Neighbours, decrease Envarsus to 8 mg.

## 2020-01-29 DIAGNOSIS — Z940 Kidney transplant status: Principal | ICD-10-CM

## 2020-01-29 MED ORDER — ENVARSUS XR 1 MG TABLET,EXTENDED RELEASE
ORAL_TABLET | 11 refills | 0 days
Start: 2020-01-29 — End: 2020-01-29

## 2020-01-29 NOTE — Unmapped (Signed)
Clinical Assessment Needed For: Dose Change  Medication: Envarsus  Last Fill Date: 01/14/2020  Copay $0.00  Was previous dose already scheduled to fill: No    Notes to Pharmacist: N/A

## 2020-02-02 ENCOUNTER — Ambulatory Visit: Admit: 2020-02-02 | Discharge: 2020-02-03 | Payer: MEDICARE

## 2020-02-02 LAB — MANUAL DIFFERENTIAL
BASOPHILS - ABS (DIFF): 0.1 10*9/L (ref 0.0–0.1)
BASOPHILS - REL (DIFF): 4 %
EOSINOPHILS - ABS (DIFF): 0.5 10*9/L — ABNORMAL HIGH (ref 0.0–0.4)
EOSINOPHILS - REL (DIFF): 25 %
LYMPHOCYTES - ABS (DIFF): 0.2 10*9/L — ABNORMAL LOW (ref 1.5–5.0)
LYMPHOCYTES - REL (DIFF): 10 %
MONOCYTES - ABS (DIFF): 0 10*9/L — ABNORMAL LOW (ref 0.2–0.8)
MONOCYTES - REL (DIFF): 2 %
NEUTROPHILS - ABS (DIFF): 1.1 10*9/L — ABNORMAL LOW (ref 2.0–7.5)
NEUTROPHILS - REL (DIFF): 59 %

## 2020-02-02 LAB — CBC W/ AUTO DIFF
HEMATOCRIT: 31.6 % — ABNORMAL LOW (ref 38.0–50.0)
HEMOGLOBIN: 10.2 g/dL — ABNORMAL LOW (ref 13.5–17.5)
MEAN CORPUSCULAR HEMOGLOBIN: 29.3 pg (ref 26.0–34.0)
MEAN CORPUSCULAR VOLUME: 90.4 fL (ref 81.0–95.0)
MEAN PLATELET VOLUME: 7.4 fL (ref 7.0–10.0)
NUCLEATED RED BLOOD CELLS: 2 /100{WBCs} (ref <=4)
PLATELET COUNT: 247 10*9/L (ref 150–450)
RED BLOOD CELL COUNT: 3.5 10*12/L — ABNORMAL LOW (ref 4.32–5.72)

## 2020-02-02 LAB — RENAL FUNCTION PANEL
ALBUMIN: 3.9 g/dL (ref 3.4–5.0)
ANION GAP: 4 mmol/L (ref 3–11)
BLOOD UREA NITROGEN: 26 mg/dL — ABNORMAL HIGH (ref 9–23)
BUN / CREAT RATIO: 13
CALCIUM: 10 mg/dL (ref 8.7–10.4)
CHLORIDE: 114 mmol/L — ABNORMAL HIGH (ref 98–107)
CO2: 23.3 mmol/L (ref 20.0–31.0)
CREATININE: 2 mg/dL — ABNORMAL HIGH (ref 0.60–1.10)
EGFR CKD-EPI NON-AA MALE: 34 mL/min/{1.73_m2}
GLUCOSE RANDOM: 99 mg/dL (ref 70–179)
PHOSPHORUS: 3.4 mg/dL (ref 2.4–5.1)
POTASSIUM: 5.4 mmol/L — ABNORMAL HIGH (ref 3.5–5.1)
SODIUM: 141 mmol/L (ref 135–145)

## 2020-02-02 LAB — TACROLIMUS BLOOD: Lab: 9.7

## 2020-02-02 LAB — MAGNESIUM
MAGNESIUM: 2 mg/dL (ref 1.6–2.6)
Magnesium:MCnc:Pt:Ser/Plas:Qn:: 2

## 2020-02-02 LAB — SODIUM: Sodium:SCnc:Pt:Ser/Plas:Qn:: 141

## 2020-02-02 LAB — HEMATOCRIT: Hematocrit:VFr:Pt:Bld:Qn:: 31.6 — ABNORMAL LOW

## 2020-02-02 LAB — EOSINOPHILS - ABS (DIFF): Eosinophils:NCnc:Pt:Bld:Qn:: 0.5 — ABNORMAL HIGH

## 2020-02-03 LAB — CMV QUANT LOG10: Lab: 4.94 — ABNORMAL HIGH

## 2020-02-03 LAB — CMV DNA, QUANTITATIVE, PCR
CMV QUANT LOG10: 4.94 {Log_IU}/mL — ABNORMAL HIGH (ref <0.00)
CMV QUANT: 86752 [IU]/mL — ABNORMAL HIGH (ref <0)

## 2020-02-03 MED ORDER — ERGOCALCIFEROL (VITAMIN D2) 1,250 MCG (50,000 UNIT) CAPSULE
ORAL_CAPSULE | ORAL | 1 refills | 28.00000 days
Start: 2020-02-03 — End: 2021-02-02

## 2020-02-03 NOTE — Unmapped (Signed)
[facility] Refill Coordination Note    Specialty Medication(s) to be Shipped:   Transplant: Envarsus 4mg  and mycophenolate mofetil 180mg     Other medication(s) to be shipped: vitamin D (sent rf request), allopurinol 100mg , amlodipine 10mg  and rybelsus 3mg      Frank Leach, DOB: 02/02/1951  Phone: [phone] (home)       All above HIPAA information was verified with patient.     Was a Nurse, learning disability used for this call? No    Completed refill call assessment today to schedule patient's medication shipment from the [facility] [phone]).       Specialty medication(s) and dose(s) confirmed: Patient reports changes to the regimen as follows: Envarsus- Take 2 tablets (8 mg total) by mouth daily & Mycophenolate-Take 1 tablet (180 mg total) by mouth Two (2) times a day **new rx's are on profile**    Changes to medications: Edu reports no changes at this time.  Changes to insurance: No  Questions for the pharmacist: No    Confirmed patient received Welcome Packet with first shipment. The patient will receive a drug information handout for each medication shipped and additional FDA Medication Guides as required.       DISEASE/MEDICATION-SPECIFIC INFORMATION        N/A    SPECIALTY MEDICATION ADHERENCE     Medication Adherence    Patient reported X missed doses in the last month: 0  Specialty Medication: Envarsus 4mg   Patient is on additional specialty medications: Yes  Additional Specialty Medications: Mycophenolate 180mg   Patient Reported Additional Medication X Missed Doses in the Last Month: 0  Patient is on more than two specialty medications: No        Envarsus 4 mg: 10 days of medicine on hand   Mycophenolate 180 mg: 10 days of medicine on hand     SHIPPING     Shipping address confirmed in Epic.     Delivery Scheduled: Yes, Expected medication delivery date: 02/11/2020.     Medication will be delivered via UPS to the prescription address in Epic [facility].    Lorelei Pont [facility] Specialty Technician

## 2020-02-03 NOTE — Unmapped (Signed)
WBC 1.8.  Will continue to hold Myfortic until next blood draw.

## 2020-02-04 MED ORDER — ERGOCALCIFEROL (VITAMIN D2) 1,250 MCG (50,000 UNIT) CAPSULE
ORAL_CAPSULE | ORAL | 1 refills | 28 days | Status: CP
Start: 2020-02-04 — End: 2021-02-03
  Filled 2020-02-10: qty 4, 28d supply, fill #0

## 2020-02-10 ENCOUNTER — Encounter: Admit: 2020-02-10 | Discharge: 2020-02-11 | Payer: MEDICARE

## 2020-02-10 LAB — CBC W/ AUTO DIFF
BASOPHILS ABSOLUTE COUNT: 0.1 10*9/L (ref 0.0–0.1)
BASOPHILS RELATIVE PERCENT: 6.1 %
EOSINOPHILS ABSOLUTE COUNT: 0.1 10*9/L (ref 0.0–0.7)
EOSINOPHILS RELATIVE PERCENT: 4.8 %
HEMATOCRIT: 31.9 % — ABNORMAL LOW (ref 38.0–50.0)
HEMOGLOBIN: 10.4 g/dL — ABNORMAL LOW (ref 13.5–17.5)
LYMPHOCYTES RELATIVE PERCENT: 10.1 %
MEAN CORPUSCULAR HEMOGLOBIN CONC: 32.6 g/dL (ref 30.0–36.0)
MEAN CORPUSCULAR HEMOGLOBIN: 29.8 pg (ref 26.0–34.0)
MEAN PLATELET VOLUME: 7.7 fL (ref 7.0–10.0)
MONOCYTES ABSOLUTE COUNT: 0 10*9/L — ABNORMAL LOW (ref 0.1–1.0)
MONOCYTES RELATIVE PERCENT: 0.4 %
NEUTROPHILS ABSOLUTE COUNT: 1.1 10*9/L — ABNORMAL LOW (ref 1.7–7.7)
NEUTROPHILS RELATIVE PERCENT: 78.6 %
NUCLEATED RED BLOOD CELLS: 1 /100{WBCs} (ref <=4)
PLATELET COUNT: 278 10*9/L (ref 150–450)
RED BLOOD CELL COUNT: 3.48 10*12/L — ABNORMAL LOW (ref 4.32–5.72)
RED CELL DISTRIBUTION WIDTH: 20 % — ABNORMAL HIGH (ref 12.0–15.0)
WBC ADJUSTED: 1.4 10*9/L — CL (ref 3.5–10.5)

## 2020-02-10 LAB — CMV DNA, QUANTITATIVE, PCR: CMV QUANT LOG10: 3.92 {Log_IU}/mL — ABNORMAL HIGH (ref <0.00)

## 2020-02-10 LAB — RENAL FUNCTION PANEL
ALBUMIN: 3.7 g/dL (ref 3.4–5.0)
ANION GAP: 4 mmol/L (ref 3–11)
BLOOD UREA NITROGEN: 23 mg/dL (ref 9–23)
BUN / CREAT RATIO: 13
CALCIUM: 10.1 mg/dL (ref 8.7–10.4)
CHLORIDE: 113 mmol/L — ABNORMAL HIGH (ref 98–107)
CO2: 23.4 mmol/L (ref 20.0–31.0)
CREATININE: 1.83 mg/dL — ABNORMAL HIGH (ref 0.60–1.10)
EGFR CKD-EPI AA MALE: 43 mL/min/{1.73_m2}
GLUCOSE RANDOM: 96 mg/dL (ref 70–179)
PHOSPHORUS: 3.4 mg/dL (ref 2.4–5.1)
SODIUM: 140 mmol/L (ref 135–145)

## 2020-02-10 LAB — CMV VIRAL LD: Lab: 0

## 2020-02-10 LAB — TACROLIMUS BLOOD: Lab: 7.2

## 2020-02-10 LAB — CALCIUM: Calcium:MCnc:Pt:Ser/Plas:Qn:: 10.1

## 2020-02-10 LAB — MAGNESIUM: Magnesium:MCnc:Pt:Ser/Plas:Qn:: 1.8

## 2020-02-10 LAB — MEAN CORPUSCULAR VOLUME: Erythrocyte mean corpuscular volume:EntVol:Pt:RBC:Qn:Automated count: 91.5

## 2020-02-10 LAB — POLYCHROMASIA

## 2020-02-10 MED FILL — AMLODIPINE 10 MG TABLET: 30 days supply | Qty: 30 | Fill #6 | Status: AC

## 2020-02-10 MED FILL — ERGOCALCIFEROL (VITAMIN D2) 1,250 MCG (50,000 UNIT) CAPSULE: 28 days supply | Qty: 4 | Fill #0 | Status: AC

## 2020-02-10 MED FILL — AMLODIPINE 10 MG TABLET: ORAL | 30 days supply | Qty: 30 | Fill #6

## 2020-02-10 MED FILL — ENVARSUS XR 4 MG TABLET,EXTENDED RELEASE: ORAL | 30 days supply | Qty: 60 | Fill #0

## 2020-02-10 MED FILL — ALLOPURINOL 100 MG TABLET: 30 days supply | Qty: 30 | Fill #4 | Status: AC

## 2020-02-10 MED FILL — MYCOPHENOLATE SODIUM 180 MG TABLET,DELAYED RELEASE: 30 days supply | Qty: 60 | Fill #0 | Status: AC

## 2020-02-10 MED FILL — ENVARSUS XR 4 MG TABLET,EXTENDED RELEASE: 30 days supply | Qty: 60 | Fill #0 | Status: AC

## 2020-02-10 MED FILL — ALLOPURINOL 100 MG TABLET: ORAL | 30 days supply | Qty: 30 | Fill #4

## 2020-02-10 MED FILL — RYBELSUS 3 MG TABLET: ORAL | 30 days supply | Qty: 30 | Fill #5

## 2020-02-10 MED FILL — RYBELSUS 3 MG TABLET: 30 days supply | Qty: 30 | Fill #5 | Status: AC

## 2020-02-10 NOTE — Unmapped (Signed)
Reviewed recent WBC and ANC with Dr. Nestor Lewandowsky  Will continue to monitor at this time.     Cr 1.83, within baseline and tacrolimus 7.2 within normal range.     No changes at this time.

## 2020-02-12 NOTE — Unmapped (Signed)
Pt stated timix 0.8 still isn't working. Please advise.

## 2020-02-12 NOTE — Unmapped (Signed)
Phone call to patient and he states he has had no results with the maximum dosing. I did inform him that I sent this concern to his provider and that we will wait to hear from her and he is in agreement.

## 2020-02-13 MED FILL — CARVEDILOL 6.25 MG TABLET: ORAL | 90 days supply | Qty: 360 | Fill #2

## 2020-02-13 MED FILL — MG-PLUS-PROTEIN 133 MG TABLET: 90 days supply | Qty: 810 | Fill #2 | Status: AC

## 2020-02-13 MED FILL — ATORVASTATIN 40 MG TABLET: 90 days supply | Qty: 90 | Fill #2 | Status: AC

## 2020-02-13 MED FILL — CARVEDILOL 6.25 MG TABLET: 90 days supply | Qty: 360 | Fill #2 | Status: AC

## 2020-02-13 MED FILL — OMEPRAZOLE 40 MG CAPSULE,DELAYED RELEASE: ORAL | 90 days supply | Qty: 45 | Fill #2

## 2020-02-13 MED FILL — ATORVASTATIN 40 MG TABLET: ORAL | 90 days supply | Qty: 90 | Fill #2

## 2020-02-13 MED FILL — MG-PLUS-PROTEIN 133 MG TABLET: ORAL | 90 days supply | Qty: 810 | Fill #2

## 2020-02-13 MED FILL — OMEPRAZOLE 40 MG CAPSULE,DELAYED RELEASE: 90 days supply | Qty: 45 | Fill #2 | Status: AC

## 2020-02-16 ENCOUNTER — Ambulatory Visit: Admit: 2020-02-16 | Discharge: 2020-02-17 | Payer: MEDICARE

## 2020-02-16 LAB — CBC W/ AUTO DIFF
BASOPHILS ABSOLUTE COUNT: 0 10*9/L (ref 0.0–0.1)
BASOPHILS RELATIVE PERCENT: 0 %
EOSINOPHILS ABSOLUTE COUNT: 0 10*9/L (ref 0.0–0.7)
EOSINOPHILS RELATIVE PERCENT: 1.6 %
HEMATOCRIT: 32.5 % — ABNORMAL LOW (ref 38.0–50.0)
HEMOGLOBIN: 10.5 g/dL — ABNORMAL LOW (ref 13.5–17.5)
LYMPHOCYTES ABSOLUTE COUNT: 0.3 10*9/L — ABNORMAL LOW (ref 0.7–4.0)
LYMPHOCYTES RELATIVE PERCENT: 9.9 %
MEAN CORPUSCULAR HEMOGLOBIN: 29.9 pg (ref 26.0–34.0)
MEAN CORPUSCULAR VOLUME: 92.5 fL (ref 81.0–95.0)
MEAN PLATELET VOLUME: 7.7 fL (ref 7.0–10.0)
MONOCYTES ABSOLUTE COUNT: 0 10*9/L — ABNORMAL LOW (ref 0.1–1.0)
MONOCYTES RELATIVE PERCENT: 1.1 %
NEUTROPHILS ABSOLUTE COUNT: 2.4 10*9/L (ref 1.7–7.7)
NEUTROPHILS RELATIVE PERCENT: 87.4 %
NUCLEATED RED BLOOD CELLS: 0 /100{WBCs} (ref <=4)
RED BLOOD CELL COUNT: 3.51 10*12/L — ABNORMAL LOW (ref 4.32–5.72)
RED CELL DISTRIBUTION WIDTH: 20.4 % — ABNORMAL HIGH (ref 12.0–15.0)
WBC ADJUSTED: 2.8 10*9/L — ABNORMAL LOW (ref 3.5–10.5)

## 2020-02-16 LAB — SLIDE REVIEW

## 2020-02-16 LAB — RENAL FUNCTION PANEL
ALBUMIN: 3.9 g/dL (ref 3.4–5.0)
ANION GAP: 4 mmol/L (ref 3–11)
BLOOD UREA NITROGEN: 22 mg/dL (ref 9–23)
CALCIUM: 10.1 mg/dL (ref 8.7–10.4)
CHLORIDE: 111 mmol/L — ABNORMAL HIGH (ref 98–107)
CO2: 23.4 mmol/L (ref 20.0–31.0)
EGFR CKD-EPI AA MALE: 46 mL/min/{1.73_m2}
EGFR CKD-EPI NON-AA MALE: 40 mL/min/{1.73_m2}
GLUCOSE RANDOM: 94 mg/dL (ref 70–179)
POTASSIUM: 5.6 mmol/L — ABNORMAL HIGH (ref 3.5–5.1)
SODIUM: 138 mmol/L (ref 135–145)

## 2020-02-16 LAB — POIKILOCYTES

## 2020-02-16 LAB — TACROLIMUS BLOOD: Lab: 6.5

## 2020-02-16 LAB — MAGNESIUM: Magnesium:MCnc:Pt:Ser/Plas:Qn:: 1.8

## 2020-02-16 LAB — NEUTROPHILS RELATIVE PERCENT: Neutrophils/100 leukocytes:NFr:Pt:Bld:Qn:Automated count: 87.4

## 2020-02-16 LAB — CREATININE: Creatinine:MCnc:Pt:Ser/Plas:Qn:: 1.74 — ABNORMAL HIGH

## 2020-02-16 NOTE — Unmapped (Signed)
Spoke with patient, he stated that he would prefer to avoid surgery at this time and would like to try super trimix.  He has previosly received trimix from [employer].  What directions would you like?  Thanks,  Lanora Manis

## 2020-02-16 NOTE — Unmapped (Signed)
Spoke to [employer], Teacher, early years/pre at [employer].  Next concentration of super trimix is 150/5/50.  Verbal rx given per Gordan Payment, AGNP.

## 2020-02-17 MED FILL — VALGANCICLOVIR 450 MG TABLET: 30 days supply | Qty: 60 | Fill #1 | Status: AC

## 2020-02-17 MED FILL — VALGANCICLOVIR 450 MG TABLET: ORAL | 30 days supply | Qty: 60 | Fill #1

## 2020-02-17 NOTE — Unmapped (Signed)
[facility] Refill Coordination Note    Specialty Medication(s) to be Shipped:   Transplant: valgancyclovir 450mg     Other medication(s) to be shipped: n/a     Frank Leach, DOB: March 10, 1951  Phone: [phone] (home)       All above HIPAA information was verified with patient.     Was a Nurse, learning disability used for this call? No    Completed refill call assessment today to schedule patient's medication shipment from the [facility] [phone]).       Specialty medication(s) and dose(s) confirmed: Regimen is correct and unchanged.   Changes to medications: Frank Leach reports no changes at this time.  Changes to insurance: No  Questions for the pharmacist: No    Confirmed patient received Welcome Packet with first shipment. The patient will receive a drug information handout for each medication shipped and additional FDA Medication Guides as required.       DISEASE/MEDICATION-SPECIFIC INFORMATION        N/A    SPECIALTY MEDICATION ADHERENCE     Medication Adherence    Patient reported X missed doses in the last month: 0  Specialty Medication: Valganciclovir 450mg   Patient is on additional specialty medications: No  Informant: patient                Valganciclvir 450 mg: 2 days of medicine on hand       SHIPPING     Shipping address confirmed in Epic.     Delivery Scheduled: Yes, Expected medication delivery date: 02/18/20.     Medication will be delivered via UPS to the prescription address in Epic [facility].    Frank Leach   [facility] Specialty Technician

## 2020-02-19 LAB — CMV QUANT: Lab: 1975 — ABNORMAL HIGH

## 2020-02-23 MED FILL — METFORMIN ER 500 MG TABLET,EXTENDED RELEASE 24 HR: ORAL | 90 days supply | Qty: 180 | Fill #2

## 2020-02-23 MED FILL — METFORMIN ER 500 MG TABLET,EXTENDED RELEASE 24 HR: 90 days supply | Qty: 180 | Fill #2 | Status: AC

## 2020-02-25 ENCOUNTER — Encounter: Admit: 2020-02-25 | Discharge: 2020-02-26 | Payer: MEDICARE

## 2020-02-25 ENCOUNTER — Encounter: Admit: 2020-02-25 | Discharge: 2020-02-26 | Payer: MEDICARE | Attending: Nephrology | Primary: Nephrology

## 2020-02-25 ENCOUNTER — Ambulatory Visit: Admit: 2020-02-25 | Discharge: 2020-02-26 | Payer: MEDICARE

## 2020-02-25 DIAGNOSIS — Z940 Kidney transplant status: Principal | ICD-10-CM

## 2020-02-25 DIAGNOSIS — E875 Hyperkalemia: Principal | ICD-10-CM

## 2020-02-25 DIAGNOSIS — Z794 Long term (current) use of insulin: Principal | ICD-10-CM

## 2020-02-25 DIAGNOSIS — E0801 Diabetes mellitus due to underlying condition with hyperosmolarity with coma: Principal | ICD-10-CM

## 2020-02-25 DIAGNOSIS — Z79899 Other long term (current) drug therapy: Principal | ICD-10-CM

## 2020-02-25 DIAGNOSIS — E119 Type 2 diabetes mellitus without complications: Principal | ICD-10-CM

## 2020-02-25 DIAGNOSIS — R600 Localized edema: Principal | ICD-10-CM

## 2020-02-25 LAB — RENAL FUNCTION PANEL
ALBUMIN: 4.1 g/dL (ref 3.4–5.0)
BLOOD UREA NITROGEN: 32 mg/dL — ABNORMAL HIGH (ref 9–23)
BUN / CREAT RATIO: 18
CALCIUM: 11 mg/dL — ABNORMAL HIGH (ref 8.7–10.4)
CHLORIDE: 115 mmol/L — ABNORMAL HIGH (ref 98–107)
CO2: 22.6 mmol/L (ref 20.0–31.0)
CREATININE: 1.79 mg/dL — ABNORMAL HIGH
EGFR CKD-EPI AA MALE: 44 mL/min/{1.73_m2} — ABNORMAL LOW (ref >=60)
EGFR CKD-EPI NON-AA MALE: 38 mL/min/{1.73_m2} — ABNORMAL LOW (ref >=60)
GLUCOSE RANDOM: 101 mg/dL — ABNORMAL HIGH (ref 70–99)
PHOSPHORUS: 3.5 mg/dL (ref 2.4–5.1)
POTASSIUM: 6.3 mmol/L (ref 3.4–4.5)
SODIUM: 141 mmol/L (ref 135–145)

## 2020-02-25 LAB — CBC W/ AUTO DIFF
BASOPHILS ABSOLUTE COUNT: 0.1 10*9/L (ref 0.0–0.1)
BASOPHILS RELATIVE PERCENT: 2.3 %
EOSINOPHILS ABSOLUTE COUNT: 0.1 10*9/L (ref 0.0–0.7)
EOSINOPHILS RELATIVE PERCENT: 2.2 %
HEMOGLOBIN: 11.1 g/dL — ABNORMAL LOW (ref 13.5–17.5)
LYMPHOCYTES ABSOLUTE COUNT: 0.2 10*9/L — ABNORMAL LOW (ref 0.7–4.0)
LYMPHOCYTES RELATIVE PERCENT: 7.8 %
MEAN CORPUSCULAR HEMOGLOBIN CONC: 31.7 g/dL (ref 30.0–36.0)
MEAN CORPUSCULAR VOLUME: 92.4 fL (ref 81.0–95.0)
MEAN PLATELET VOLUME: 7.8 fL (ref 7.0–10.0)
MONOCYTES ABSOLUTE COUNT: 0 10*9/L — ABNORMAL LOW (ref 0.1–1.0)
MONOCYTES RELATIVE PERCENT: 0.8 %
NEUTROPHILS ABSOLUTE COUNT: 2.4 10*9/L (ref 1.7–7.7)
NEUTROPHILS RELATIVE PERCENT: 86.9 %
PLATELET COUNT: 239 10*9/L (ref 150–450)
RED CELL DISTRIBUTION WIDTH: 20.2 % — ABNORMAL HIGH (ref 12.0–15.0)
WBC ADJUSTED: 2.7 10*9/L — ABNORMAL LOW (ref 3.5–10.5)

## 2020-02-25 LAB — SLIDE REVIEW

## 2020-02-25 LAB — HOWELL-JOLLY BODIES

## 2020-02-25 LAB — PROTEIN / CREATININE RATIO, URINE
PROTEIN URINE: 15.4 mg/dL
PROTEIN/CREAT RATIO, URINE: 0.199

## 2020-02-25 LAB — ALBUMIN: Albumin:MCnc:Pt:Ser/Plas:Qn:: 4.1

## 2020-02-25 LAB — URINALYSIS
BACTERIA: NONE SEEN /HPF
BILIRUBIN UA: NEGATIVE
BLOOD UA: NEGATIVE
GLUCOSE UA: NEGATIVE
KETONES UA: NEGATIVE
LEUKOCYTE ESTERASE UA: NEGATIVE
NITRITE UA: NEGATIVE
PH UA: 6.5 (ref 5.0–9.0)
PROTEIN UA: NEGATIVE
RBC UA: <1 /HPF (ref <3)
SQUAMOUS EPITHELIAL: 2 /HPF (ref 0–5)
UROBILINOGEN UA: 0.2
WBC UA: 3 /HPF — ABNORMAL HIGH (ref <2)

## 2020-02-25 LAB — TACROLIMUS BLOOD: Lab: 6

## 2020-02-25 LAB — WBC UA: Leukocytes:Naric:Pt:Urine sed:Qn:Microscopy.light.HPF: 3 — ABNORMAL HIGH

## 2020-02-25 LAB — MAGNESIUM: Magnesium:MCnc:Pt:Ser/Plas:Qn:: 1.8

## 2020-02-25 LAB — MONOCYTES ABSOLUTE COUNT: Monocytes:NCnc:Pt:Bld:Qn:Automated count: 0 — ABNORMAL LOW

## 2020-02-25 LAB — CREATININE, URINE: Lab: 77.4

## 2020-02-25 MED ORDER — MYCOPHENOLATE SODIUM 180 MG TABLET,DELAYED RELEASE
ORAL_TABLET | 11 refills | 0.00000 days
Start: 2020-02-25 — End: ?

## 2020-02-25 MED ORDER — CHOLECALCIFEROL (VITAMIN D3) 125 MCG (5,000 UNIT) TABLET
Freq: Every day | ORAL | 0.00000 days
Start: 2020-02-25 — End: ?

## 2020-02-25 MED ORDER — SODIUM POLYSTYRENE SULFONATE 15 GRAM-SORBITOL 20 GRAM/60 ML ORAL SUSP
Freq: Once | ORAL | 0 refills | 1.00000 days | Status: CP
Start: 2020-02-25 — End: 2020-02-26
  Filled 2020-02-25: qty 60, 1d supply, fill #0

## 2020-02-25 MED ORDER — GLIPIZIDE 5 MG TABLET
ORAL_TABLET | Freq: Every day | ORAL | 11 refills | 30.00000 days | Status: CP
Start: 2020-02-25 — End: 2021-02-24
  Filled 2020-03-04: qty 30, 30d supply, fill #0

## 2020-02-25 MED ORDER — TRADJENTA 5 MG TABLET
ORAL_TABLET | Freq: Every day | ORAL | 11 refills | 30.00000 days | Status: CP
Start: 2020-02-25 — End: 2020-02-25

## 2020-02-25 MED ORDER — MG-PLUS-PROTEIN 133 MG TABLET
ORAL_TABLET | Freq: Two times a day (BID) | ORAL | 11 refills | 30 days | Status: CP
Start: 2020-02-25 — End: ?

## 2020-02-25 MED ORDER — TRULICITY 0.75 MG/0.5 ML SUBCUTANEOUS PEN INJECTOR: 1 mg | mL | 5 refills | 28 days | Status: AC

## 2020-02-25 MED ORDER — TRULICITY 0.75 MG/0.5 ML SUBCUTANEOUS PEN INJECTOR
SUBCUTANEOUS | 11 refills | 28.00000 days | Status: CP
Start: 2020-02-25 — End: 2020-02-25

## 2020-02-25 MED FILL — SPS (WITH SORBITOL) 15 GRAM-20 GRAM/60 ML ORAL SUSPENSION: 1 days supply | Qty: 60 | Fill #0 | Status: AC

## 2020-02-25 NOTE — Unmapped (Signed)
Transplant Nephrology Clinic Visit    Subjective/Interval:   Since patient's last visit in the transplant clinic - patient has been doing well in terms of transplant, taking transplant medications regularly, no episodes of rejection and no side effects of medications.    Today he presents feeling overall well s/p kidney transplant. He does complain of left lower extremity swelling with onset three weeks ago. He states still being able to walk on his left foot and denies pain or ever experiencing swelling to this extent. Wife reports patient's memory is okay; denies forgetfulness. Patient reported severe diarrhea while on Valcyte and he reported discontinuing Myfortic. Endorses stable weight and normal appetite. Per patient, diabetes and home BP are well controlled. Denies tremors, headaches, abdominal pain, and recent gout.     Last dose of Envarsus at 9 am.     Assessment:  69 y.o. 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       Recommendations/Plan:     Allograft Function: DDKT KDPI: 42%06/22/2019  Renal function holding relatively stable with electrolytes and acid base balanced.     Baseline Cr: ~ 1.8  Last Cr:  1.79 Date:  02/25/20   Decoy/ BK     neg Date:  01/15/20   DSA     neg Date:  01/19/20  CMV:     1,975 Date:  02/16/20  UPC:     0.053 Date:  01/15/20     Patient asked to continue with strict k restriction due to hyperkalemia   If creatinine remains high at 1.8/1.9 will do kidney biopsy in 2 weeks.       Immunosuppression Management [High Risk Medical Decision Making For Drug Therapy Requiring Intensive Monitoring For Toxicity]:     Tacro target: 7-9  Tacro dose: 10mg    Tacro last lvl /date: 6.5 ( 02-16-20 ), repeat pending.  MMF/MPA dose : 540 bid   Side effects: none      Blood Pressure Management: BP well controlled on  Coreg and Amlodipine.      Lipid Management: Last lipid panel on 08/20/2018.  The 10-year ASCVD risk score Denman George DC Jr., et al., 2013) is: 36.3%  Currently taking atorvastatin     Electrolytes: Patient asked to continue drinking more water, labs reflect hyperkalemia  - 6.3 in clinic today. Will treat with kayexalate. Magnesium in normal range. Most recent 1.8 (02/25/20). Decrease magnesium supplements to BID.  Ergocalciferol to be continued     Infectious Prophylaxis and Monitoring: CMV VL 1,975.  Decoy cells not detected 01/15/20.   BK VL not detected 01/26/20.    Gout: No flares recently.  Continue on Allopurinol.     RCC s/p bilateral nephrectomy:   Last CT RMP 08/29/2018 showed no suspicious enhancing lesions.   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    Left Lower Extremity Swelling:  Ordered US of left leg to rule out blood clot.   If negative for blood clot, will start on a diuretic.     Health Maintenance:   Colonoscopy: 04/30/2018 - 1 adenomatous polyp; repeat 5-10 years    Immunizations:  Flu Shot: 04/30/2019  Prevnar 13: 10/30/2013  Pneumovax: 04/24/2016, due 2022  No immunizations till 1 year post transplant.  Covid Vaccine: 10/14/19, 09/12/19      History of Present Illness    69 y.o. male here for follow up after kidney transplantation.  Patient has ESRD secondary to HTN.  Patient has been on dialysis since 01/25/2012.  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 Patient was admitted for kidney transplant on 06/21/2019.  Patient experienced hyperkalemia that despite medical management would not decrease.  2 hours of HD performed.  Patient discharged 06/26/2019.       Transplant History:    Organ Received: Left DDKT, DBD, PHS, KDPI: 42%; 21h cold ischemia  Native Kidney Disease: HTN; cPRA: 0%  Date of Transplant: 06/22/2019  Post-Transplant Course: HD once for high potassium  Prior Transplants: none  Induction: [place]  Date of Ureteral Stent Removal: 07/29/2019  CMV/EBV Status: CMV D+/R+  EBV D+/R+  Rejection Episodes: none  Donor Specific Antibodies: none  Results of Renal Imaging (pre and post):   Pre-Txp 08/29/2018 CT RMP  Sequela of bilateral nephrectomy. Interval decrease in linear soft tissue within the right nephrectomy bed, potentially postsurgical or fat necrosis. Unchanged linear tissue within the left nephrectomy bed. No suspicious enhancing lesions are visualized within the surgical beds    Post-Txp 06/25/2019 (txp kidney only)  The renal transplant was located in the left lower quadrant. Normal size and echogenicity.  No solid masses or calculi. Trace perinephric fluid adjacent to the lower pole of the kidney. Mild pelviectasis  - Perfusion: Using power Doppler, normal perfusion was seen throughout the renal parenchyma.  - Resistive indices in the renal transplant are stable compared with prior examination.  - Main renal artery/iliac artery: Patent. Again noted are 3 renal arteries. Resistive indices within the renal arteries are stable to minimally increased, now at or just above normal limits.  - Main renal vein/iliac vein: Patent    Past Medical History  1. HTN  2. DM II  3. RCC s/p Bilateral nephrectomy (L 04/2016, R 12/2016)  4. GERD  5. HLD  6. 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    Medications  Current Outpatient Medications   Medication Sig Dispense Refill   ??? acetaminophen (TYLENOL) 500 MG tablet Take 1-2 tablets (500-1,000 mg total) by mouth every six (6) hours as needed for pain or fever (> 38C). (Patient not taking: Reported on 10/27/2019) 100 tablet 0   ??? allopurinoL (ZYLOPRIM) 100 MG tablet Take 1 tablet (100 mg total) by mouth every evening. 30 tablet 11   ??? amLODIPine (NORVASC) 10 MG tablet Take 1 tablet (10 mg total) by mouth daily. 30 tablet 11   ??? aspirin (ECOTRIN) 81 MG tablet Take 1 tablet (81 mg total) by mouth daily. 30 tablet 11   ??? atorvastatin (LIPITOR) 40 MG tablet Take 1 tablet (40 mg total) by mouth daily. 90 tablet 3   ??? carvediloL (COREG) 6.25 MG tablet Take 2 tablets (12.5 mg total) by mouth Two (2) times a day. 360 tablet 3   ??? ENVARSUS XR 4 mg Tb24 extended release tablet Take 2 tablets (8 mg total) by mouth daily. 60 tablet 11   ??? ergocalciferol-1,250 mcg, 50,000 unit, (DRISDOL) 1,250 mcg (50,000 unit) capsule Take 1 capsule (50,000 Units total) by mouth once a week.  4 capsule 1   ??? magnesium oxide-Mg AA chelate (MAGNESIUM, AMINO ACID CHELATE,) 133 mg Tab Take 3 tablets by mouth Three (3) times a day. 810 tablet 3   ??? metFORMIN (GLUCOPHAGE-XR) 500 MG 24 hr tablet Take 2 tablets (1,000 mg total) by mouth once daily. 180 tablet 3   ??? mycophenolate (MYFORTIC) 180 MG EC tablet Take 1 tablet (180 mg total) by mouth Two (2) times a day. 60 tablet 11   ??? omeprazole (PRILOSEC) 40 MG capsule Take 1 capsule (40 mg total) by mouth every other day. 45 capsule 3   ??? papaverine 16.7 mg/mL phentolamine 0.56 mg/mL alprostadil 5.6 mcg/mL injection Inject as directed by physician, starting with 0.1 mL. May increase by 0.1 ml with next dose if not effective. 5 mL 11   ??? semaglutide 3 mg Tab Take 1 tablet (3 mg) by mouth daily. 90 tablet 3   ??? sildenafiL (VIAGRA) 50 MG tablet Take 1 tablet (50 mg total) by mouth nightly as needed for erectile dysfunction. 20 tablet 1   ??? sodium polystyrene, SPS, with sorbitol (SPS) 15-20 gram/60 mL Susp Take 60 ml (30 grams) by mouth today and 60 ml (30 grams) by mouth tomorrow 230 mL 0   ??? valGANciclovir (VALCYTE) 450 mg tablet Take 1 tablet (450 mg total) by mouth Two (2) times a day. 60 tablet 5     Current Facility-Administered Medications   Medication Dose Route Frequency Provider Last Rate Last Admin   ??? sodium chloride bacteriostatic 0.9 % injection 0.1 mL  0.1 mL Injection Once [employer], AGNP         BP 152/65 (BP Site: R Arm, BP Position: Sitting, BP Cuff Size: Small)  - Pulse 58  - Temp 36.1 ??C (96.9 ??F) (Temporal)  - Wt 73.5 kg (162 lb)  - BMI 26.15 kg/m??     Nursing note and vitals reviewed.   Constitutional: Oriented to person, place, and time. Appears well-developed and well-nourished. No distress.   HENT: wearing mask   Head: Normocephalic and atraumatic.   Eyes: Right eye exhibits no discharge. Left eye exhibits no discharge. No scleral icterus.   Neck: Normal range of motion. Neck supple.   Cardiovascular: systolic murmur 4/6  Pulmonary/Chest: Effort normal and breath sounds normal. No respiratory distress.   Abdominal: Soft. Non tender  Musculoskeletal: 2+ pitting edema to the L knee   Neurological: Alert and oriented to person, place, and time.   Skin: Skin is warm and dry. No rash noted. Not diaphoretic. No erythema. No pallor.   Psychiatric: Normal mood and affect. Behavior is normal. Judgment and thought content normal.       Laboratory Data and Imaging reviewed in EPIC    Counseling:  I counseled the patient on:  The need to avoid sun exposure and the use of sunblock while outdoors given the relatively higher risk of skin malignancy in an immunosuppressed state.  The need for adherence to immunosuppression medication.  Patient verbalized understanding.     Follow-Up:  Return to clinic in 6 weeks   Labs: twice a week  Patient will continue to follow-up with his primary care provider for non-transplant related issues and medication refills. We have ordered transplant specific labs per the center's guidelines to monitor and assess for toxicities from immunosuppressant drug therapy    Scribe's Attestation: Leeroy Bock, MD obtained and performed the history, physical exam and medical decision making elements that were entered into the chart.  Signed by Karoline Caldwell  Gerald Leitz, on February 25, 2020 at 12:17 PM.    Documentation assistance provided by the Scribe. I was present during the time the encounter was recorded. The information recorded by the Scribe was done at my direction and has been reviewed and validated by me.

## 2020-02-25 NOTE — Unmapped (Signed)
Let pt know that [facility] MAPS team will look into manufacturer assistance for Trulicity.  If he doesn't qualify for MFR assistance or is running low on Rybelsus (he has a couple weeks left) will transition to glipizide 5 mg daily. Pt verbalized understanding.

## 2020-02-25 NOTE — Unmapped (Signed)
Order placed in clinic

## 2020-02-25 NOTE — Unmapped (Signed)
[facility] PHARMACY NOTE  02/25/2020   Frank Leach  [id number]    Medication changes today:   1.  Stop Rybelsus due to cost  2. Decrease magnesium to 399 mg BID  3. Change omeprazole 20 mg to PRN  4. Take 1 dose of Kayexalate 15g x1  5. Stop ergocalciferol when finish current supply and start cholecalciferol 5,000 units daily  6. Kayexalate 15g x1    Education/Adherence tools provided today:  1.provided updated medication list  2. provided additional education on immunosuppression and transplant related medications including reviewing indications of medications, dosing and side effects  3.  Provided additional education on metformin and carvedilol    Follow up items:  1. goal of understanding indications and dosing of immunosuppression medications  2. A1C at next visit  3. CMV and if can stop Valcyte (will need to monitor with labs x2 months)  4. When to add back Myfortic  5. K+ and if needs Lokelma    Next visit with pharmacy in 1-3 months  ____________________________________________________________________    Frank Leach is a 69 y.o. 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     Other PMH significant for diabetes (diet controlled pre transplant), gout, HTN    Seen by pharmacy today for: medication management and pill box fill and adherence education; last seen by pharmacy 6 months ago     CC:  Patient complains of LLE swelling x 2 weeks     There were no vitals filed for this visit.    No Known Allergies    All medications reviewed and updated.     Medication list includes revisions made during today???s encounter    Outpatient Encounter Medications as of 02/25/2020   Medication Sig Dispense Refill   ??? acetaminophen (TYLENOL) 500 MG tablet Take 1-2 tablets (500-1,000 mg total) by mouth every six (6) hours as needed for pain or fever (> 38C). (Patient not taking: Reported on 10/27/2019) 100 tablet 0   ??? allopurinoL (ZYLOPRIM) 100 MG tablet Take 1 tablet (100 mg total) by mouth every evening. 30 tablet 11   ??? amLODIPine (NORVASC) 10 MG tablet Take 1 tablet (10 mg total) by mouth daily. 30 tablet 11   ??? aspirin (ECOTRIN) 81 MG tablet Take 1 tablet (81 mg total) by mouth daily. 30 tablet 11   ??? atorvastatin (LIPITOR) 40 MG tablet Take 1 tablet (40 mg total) by mouth daily. 90 tablet 3   ??? carvediloL (COREG) 6.25 MG tablet Take 2 tablets (12.5 mg total) by mouth Two (2) times a day. 360 tablet 3   ??? ENVARSUS XR 4 mg Tb24 extended release tablet Take 2 tablets (8 mg total) by mouth daily. 60 tablet 11   ??? ergocalciferol-1,250 mcg, 50,000 unit, (DRISDOL) 1,250 mcg (50,000 unit) capsule Take 1 capsule (50,000 Units total) by mouth once a week. 4 capsule 1   ??? magnesium oxide-Mg AA chelate (MAGNESIUM, AMINO ACID CHELATE,) 133 mg Tab Take 3 tablets by mouth Three (3) times a day. 810 tablet 3   ??? metFORMIN (GLUCOPHAGE-XR) 500 MG 24 hr tablet Take 2 tablets (1,000 mg total) by mouth once daily. 180 tablet 3   ??? mycophenolate (MYFORTIC) 180 MG EC tablet Take 1 tablet (180 mg total) by mouth Two (2) times a day. 60 tablet 11   ??? omeprazole (PRILOSEC) 40 MG capsule Take 1 capsule (40 mg total) by mouth every other day. 45 capsule 3   ???  papaverine 16.7 mg/mL phentolamine 0.56 mg/mL alprostadil 5.6 mcg/mL injection Inject as directed by physician, starting with 0.1 mL. May increase by 0.1 ml with next dose if not effective. 5 mL 11   ??? semaglutide 3 mg Tab Take 1 tablet (3 mg) by mouth daily. 90 tablet 3   ??? sildenafiL (VIAGRA) 50 MG tablet Take 1 tablet (50 mg total) by mouth nightly as needed for erectile dysfunction. 20 tablet 1   ??? sodium polystyrene, SPS, with sorbitol (SPS) 15-20 gram/60 mL Susp Take 60 ml (30 grams) by mouth today and 60 ml (30 grams) by mouth tomorrow 230 mL 0   ??? valGANciclovir (VALCYTE) 450 mg tablet Take 1 tablet (450 mg total) by mouth Two (2) times a day. 60 tablet 5     Facility-Administered Encounter Medications as of 02/25/2020 Medication Dose Route Frequency Provider Last Rate Last Admin   ??? sodium chloride bacteriostatic 0.9 % injection 0.1 mL  0.1 mL Injection Once Sylvan Cheese, AGNP         Induction agent : alemtuzumab    CURRENT IMMUNOSUPPRESSION: Envarsus 8 mg PO qd   prograf/Envarsus/cyclosporine goal: 6-8   myfortic on hold for CMV   steroid free     Patient is tolerating immunosuppression well    IMMUNOSUPPRESSION DRUG LEVELS:  Lab Results   Component Value Date    Tacrolimus, Trough 7.2 01/19/2020    Tacrolimus, Trough 2.1 (L) 06/26/2019    Tacrolimus, Trough 3.0 (L) 06/25/2019    Tacrolimus, Timed 6.5 02/16/2020    Tacrolimus, Timed 7.2 02/10/2020    Tacrolimus, Timed 9.7 02/02/2020     No results found for: CYCLO  No results found for: EVEROLIMUS  No results found for: SIROLIMUS    Envarsus level is accurate 24 hour trough    Graft function: stable  DSA: ntd  Biopsies to date: 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  Once CMV clears consider adding back Myfortic. Continue to monitor.    OI Prophylaxis:   CMV Status: D+/ R+, moderate risk . CMV treatment: Valcyte 450 mg BID since 6/15  CrCl cannot be calculated (Unknown ideal weight.).  Lab Results   Component Value Date    CMV Quant 1,975 (H) 02/16/2020    CMV Quant 8,228 (H) 02/10/2020    CMV Quant 16,109 (H) 02/02/2020    CMV Quant 604,540 (H) 01/26/2020    CMV Quant 981,191 (H) 01/19/2020    CMV Quant 706 (H) 11/27/2019     PCP Prophylaxis: bactrim SS 1 tab MWF x 6 months complete  Thrush: completed in hospital    Plan: CMV pending today.  Once completes Valcyte will need to monitor CMV x8 weeks on labs. Continue to monitor.    CV Prophylaxis: asa 81 mg   The 10-year ASCVD risk score Denman George DC Jr., et al., 2013) is: 33.8%  Statin therapy: Indicated; currently on atorvastatin 40 mg daily  Plan: Continue to monitor     BP: Goal < 140/90. Clinic vitals reported above  Home BP ranges: 130-140/70  Current meds include: amlodipine 10 mg daily; carvedilol 12.5 mg BID  Plan: IContinue to monitor     Anemia of CKD:  H/H:   Lab Results   Component Value Date    HGB 10.5 (L) 02/16/2020     Lab Results   Component Value Date    HCT 32.5 (L) 02/16/2020  Iron panel:  Lab Results   Component Value Date    IRON 92 06/25/2019    TIBC 136.1 (L) 06/25/2019    FERRITIN 1,820.0 (H) 04/20/2016     Lab Results   Component Value Date    Iron Saturation (%) 68 (H) 06/25/2019    Iron Saturation (%) 28 12/25/2011       Prior ESA use: none post txp  Plan: stable. Continue to monitor.     DM:   Lab Results   Component Value Date    A1C 7.0 (H) 11/27/2019   . Goal A1c < 7  History of Dm? Yes: T2DM pre transplant that was diet controlled  Established with endocrinologist/PCP for BG managment? No  Currently on: metformin 1000 mg XR daily, Rybelsus 3 mg daily  Home BS log: checks FBG daily 130-140  Diet: excellent appetite  Exercise:not yet  Fluid intake: 4-5 bottles daily  Plan:  Stop Rybelsus given >$200 copay. Will see if Tradjenta covered.  If not will switch to glipizide.    Electrolytes: K6.3  Meds currently on: mg plus protein 399 mg TID  Plan: Decrease mg plus protein to 399 mg BID. Kayexalate 15 g x1.  Monitor K+ and if needs maintenance therapy (may need MFR assistance for Lokelma) Continue to monitor      GI/BM: pt reports no complaints  Meds currently on: omeprazole 20 mg QOD  Plan: Change PPI to PRN. Continue to monitor    Pain: pt reports mild incisional site pain  Meds currently on: APAP PRN (not using)  Plan: Continue to monitor    Bone health:   Vitamin D Level: 52 on 11/27/19. Goal > 30.   Last DEXA results:  none available  Current meds include: ergocalciferol 50,000 units weekly  Plan: Vitamin D level  within goal.  Will stop ergocalciferol when he runs out and transition to cholecalciferol 5,000 units daily. Continue to monitor.     Women's/Men's Health:  Frank Leach is a 69 y.o. male. Patient reports no men's/women's health issues  Plan: Continue to monitor    Gout - last gout flare >2 years ago  Meds currently on: allopurinol 100 mg daily  Plan: continue to monitor    Erectile dysfunction  Meds currently on: papaverine/phentolamine/alprostadil penile injection PRN (hasn't used yet)  Plan: Continue to monitor    Pharmacy preference:  [facility]    Adherence: Patient has poor understanding of medications; was not able to independently identify names/doses of immunosuppressants and OI meds.  Patient  does not fill their own pill box on a regular basis at home.  His wife manages meds  Patient brought medication card:yes  Pill box:did not bring  Plan: Encouraged patient to start to learn meds and eventually help with pill box fill; provided moderate adherence counseling/intervention    I spent a total of 20 minutes face to face with the patient delivering clinical care and providing education/counseling.    Patient was reviewed with Dr. Nestor Lewandowsky who was agreement with the stated plan:     During this visit, the following was completed:   BG log data assessment  BP log data assessment  Labs ordered and evaluated  complex treatment plan >1 DS     All questions/concerns were addressed to the patient's satisfaction.  __________________________________________    Cleone Slim, PHARMD, CPP  [employer] CLINICAL PHARMACIST PRACTITIONER  PAGER [phone]

## 2020-02-25 NOTE — Unmapped (Signed)
Confirmed with patient wife Frank Leach that they will pick up  Kayexalate today and take it as prescribed per labs today of K 6.3    Reviewed with Dr. Nestor Lewandowsky and Frank Leach    Patient went for PVLs today and Dr. Nestor Lewandowsky reviewed results  No DVT  Noted.     Pt. To wear compression stockings for swelling, pt. Wife verbalizes they have compression stocking at home available and that patient used to wear one and stopped.

## 2020-02-25 NOTE — Unmapped (Signed)
I met with patient in clinic today, he was accompanied by his wife Frank Leach. patient appears to be doing well.  Pt. Only concern in clinic today was his left lower ext swelling  PVL ordered and scheduled for today @ [facility].   Denies fever/chills, UTI s/s, denies HA, tremors and nerve pain  Pt. Is currently getting medications OK.     Pt. Is drinking 4-5 bottles of water. No n/v/d and seems to be eating 2 meals a day. Pt. Has normal BM     Tac goal 6-8, patient took his Envarsus at 9 am on 7-20    Pt. To continue Rebelsys until it runs out, then new agent will be decided. Pt. To start taking OTC 5,000 units vitamin D and can be obtained at [facility].    Pt. Up to date with COVID vaccines.   Patient goes to [facility] lab and gets labs weekly.     Pt retired lives with wife.     RTC in 6 weeks. ~ follow up CT ABD/pelvis needed, ordered and sent for scheduling.   I have spent 5 mins with the patient educated him regarding his kidney care and scheduling same day PVL

## 2020-02-25 NOTE — Unmapped (Signed)
AOBP performed R arm, small cuff.  Average - 152/65, p 58   1st reading - 151/67, p 58   2nd reading - 150/65, p 58    3rd reading- 155/64, p 58

## 2020-02-26 LAB — CMV DNA, QUANTITATIVE, PCR: CMV QUANT LOG10: 2.34 {Log_IU}/mL — ABNORMAL HIGH (ref <0.00)

## 2020-02-26 LAB — CMV VIRAL LD: Lab: 0

## 2020-02-28 DIAGNOSIS — Z794 Long term (current) use of insulin: Principal | ICD-10-CM

## 2020-02-28 DIAGNOSIS — E0801 Diabetes mellitus due to underlying condition with hyperosmolarity with coma: Principal | ICD-10-CM

## 2020-02-28 DIAGNOSIS — Z940 Kidney transplant status: Principal | ICD-10-CM

## 2020-03-01 ENCOUNTER — Ambulatory Visit: Admit: 2020-03-01 | Discharge: 2020-03-02 | Payer: MEDICARE

## 2020-03-01 LAB — ESTIMATED AVERAGE GLUCOSE: Estimated average glucose:MCnc:Pt:Bld:Qn:Estimated from glycated hemoglobin: 108

## 2020-03-01 LAB — CBC W/ AUTO DIFF
BASOPHILS ABSOLUTE COUNT: 0 10*9/L (ref 0.0–0.1)
BASOPHILS RELATIVE PERCENT: 0 %
EOSINOPHILS RELATIVE PERCENT: 1.6 %
HEMATOCRIT: 34 % — ABNORMAL LOW (ref 38.0–50.0)
HEMOGLOBIN: 11.1 g/dL — ABNORMAL LOW (ref 13.5–17.5)
LYMPHOCYTES ABSOLUTE COUNT: 0.4 10*9/L — ABNORMAL LOW (ref 0.7–4.0)
LYMPHOCYTES RELATIVE PERCENT: 13.8 %
MEAN CORPUSCULAR HEMOGLOBIN CONC: 32.6 g/dL (ref 30.0–36.0)
MEAN CORPUSCULAR VOLUME: 93.4 fL (ref 81.0–95.0)
MEAN PLATELET VOLUME: 7.2 fL (ref 7.0–10.0)
MONOCYTES ABSOLUTE COUNT: 0 10*9/L — ABNORMAL LOW (ref 0.1–1.0)
MONOCYTES RELATIVE PERCENT: 1 %
NEUTROPHILS ABSOLUTE COUNT: 2.2 10*9/L (ref 1.7–7.7)
NUCLEATED RED BLOOD CELLS: 0 /100{WBCs} (ref <=4)
PLATELET COUNT: 190 10*9/L (ref 150–450)
RED BLOOD CELL COUNT: 3.64 10*12/L — ABNORMAL LOW (ref 4.32–5.72)
RED CELL DISTRIBUTION WIDTH: 20.7 % — ABNORMAL HIGH (ref 12.0–15.0)
WBC ADJUSTED: 2.6 10*9/L — ABNORMAL LOW (ref 3.5–10.5)

## 2020-03-01 LAB — RENAL FUNCTION PANEL
ALBUMIN: 3.8 g/dL (ref 3.4–5.0)
ANION GAP: 4 mmol/L — ABNORMAL LOW (ref 5–14)
BLOOD UREA NITROGEN: 22 mg/dL (ref 9–23)
BUN / CREAT RATIO: 14
CALCIUM: 10 mg/dL (ref 8.7–10.4)
CHLORIDE: 113 mmol/L — ABNORMAL HIGH (ref 98–107)
CO2: 22.6 mmol/L (ref 20.0–31.0)
CREATININE: 1.6 mg/dL — ABNORMAL HIGH
EGFR CKD-EPI AA MALE: 51 mL/min/{1.73_m2} — ABNORMAL LOW (ref >=60)
EGFR CKD-EPI NON-AA MALE: 44 mL/min/{1.73_m2} — ABNORMAL LOW (ref >=60)
PHOSPHORUS: 3.2 mg/dL (ref 2.4–5.1)
POTASSIUM: 5 mmol/L — ABNORMAL HIGH (ref 3.4–4.5)
SODIUM: 140 mmol/L (ref 135–145)

## 2020-03-01 LAB — CMV DNA, QUANTITATIVE, PCR
CMV QUANT LOG10: 1.99 {Log_IU}/mL — ABNORMAL HIGH (ref <0.00)
CMV QUANT: 98 [IU]/mL — ABNORMAL HIGH (ref <0)

## 2020-03-01 LAB — MAGNESIUM: Magnesium:MCnc:Pt:Ser/Plas:Qn:: 1.6

## 2020-03-01 LAB — CHLORIDE: Chloride:SCnc:Pt:Ser/Plas:Qn:: 113 — ABNORMAL HIGH

## 2020-03-01 LAB — TACROLIMUS BLOOD: Lab: 7.1

## 2020-03-01 LAB — CMV QUANT LOG10: Lab: 1.99 — ABNORMAL HIGH

## 2020-03-01 LAB — HEMOGLOBIN A1C: ESTIMATED AVERAGE GLUCOSE: 108 mg/dL

## 2020-03-01 LAB — LYMPHOCYTES RELATIVE PERCENT: Lymphocytes/100 leukocytes:NFr:Pt:Bld:Qn:Automated count: 13.8

## 2020-03-02 DIAGNOSIS — Z940 Kidney transplant status: Principal | ICD-10-CM

## 2020-03-02 MED ORDER — MYCOPHENOLATE SODIUM 180 MG TABLET,DELAYED RELEASE
ORAL_TABLET | 11 refills | 0 days
Start: 2020-03-02 — End: ?

## 2020-03-02 NOTE — Unmapped (Addendum)
After reviewing labs with Leeroy Bock, MD patient can restart Myfortic 180 mg BID.

## 2020-03-04 MED FILL — GLIPIZIDE 5 MG TABLET: 30 days supply | Qty: 30 | Fill #0 | Status: AC

## 2020-03-04 MED FILL — ALLOPURINOL 100 MG TABLET: ORAL | 30 days supply | Qty: 30 | Fill #5

## 2020-03-04 MED FILL — AMLODIPINE 10 MG TABLET: 30 days supply | Qty: 30 | Fill #7 | Status: AC

## 2020-03-04 MED FILL — ALLOPURINOL 100 MG TABLET: 30 days supply | Qty: 30 | Fill #5 | Status: AC

## 2020-03-04 MED FILL — AMLODIPINE 10 MG TABLET: ORAL | 30 days supply | Qty: 30 | Fill #7

## 2020-03-04 NOTE — Unmapped (Signed)
Dialysis Access Coordinator Note:  Frank Leach, DOB: May 19, 1951  Contact: phone, does not appear to use mychart  Challenges: -  COVID-19 status: vaccinated    Issue/Concern:  Referral from TNC to assess vascular access. Kidney txp 06/22/2019.     Current Dialysis Access:   unknown    Previous dialysis access history:   unknown    Vessel Mapping/Studies:   Left arm duplex ordered by Dr Frank Leach completed 02/25/2020    NOTES:   03/04/2020  Referred by TNC to assess access. 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  Request to schedule in clinic visit on or about 04/01/2020 sent.    PMH: Kidney txp 06/22/2019; left kidney cancer; DM; gout; HTN; hyperlipidemia  PSH: kidney txp 2020; splenectomy  Labs on 03/01/2020: sCr 1.6; GFR 44 post txp  Anticoagulant/Antiplatelet medications: aspirin 81 mg daily; Indications: -; This medication status was reviewed on: 03/04/2020 via chart review    Nephrologist: Frank Leach    Dialysis Center:  n/a  Dialysis Days:   n/a    Pt lives in: [place], [place]    Frank Leach, [place]  Dialysis Access Coordinator for Dr. Zara Leach of [facility]  [address] [place], [place] [address]  Desk: [phone]  Fax: [phone]

## 2020-03-08 ENCOUNTER — Ambulatory Visit: Admit: 2020-03-08 | Discharge: 2020-03-09 | Payer: MEDICARE

## 2020-03-08 LAB — SLIDE REVIEW

## 2020-03-08 LAB — CBC W/ AUTO DIFF
BASOPHILS ABSOLUTE COUNT: 0 10*9/L (ref 0.0–0.1)
BASOPHILS RELATIVE PERCENT: 0 %
EOSINOPHILS ABSOLUTE COUNT: 0 10*9/L (ref 0.0–0.7)
EOSINOPHILS RELATIVE PERCENT: 1.8 %
HEMATOCRIT: 33.6 % — ABNORMAL LOW (ref 38.0–50.0)
HEMOGLOBIN: 11 g/dL — ABNORMAL LOW (ref 13.5–17.5)
LYMPHOCYTES ABSOLUTE COUNT: 0.3 10*9/L — ABNORMAL LOW (ref 0.7–4.0)
MEAN CORPUSCULAR HEMOGLOBIN CONC: 32.6 g/dL (ref 30.0–36.0)
MEAN CORPUSCULAR HEMOGLOBIN: 30.6 pg (ref 26.0–34.0)
MEAN CORPUSCULAR VOLUME: 93.8 fL (ref 81.0–95.0)
MEAN PLATELET VOLUME: 7.1 fL (ref 7.0–10.0)
MONOCYTES ABSOLUTE COUNT: 0.1 10*9/L (ref 0.1–1.0)
MONOCYTES RELATIVE PERCENT: 2.6 %
NUCLEATED RED BLOOD CELLS: 1 /100{WBCs} (ref <=4)
PLATELET COUNT: 251 10*9/L (ref 150–450)
RED BLOOD CELL COUNT: 3.58 10*12/L — ABNORMAL LOW (ref 4.32–5.72)
RED CELL DISTRIBUTION WIDTH: 20.9 % — ABNORMAL HIGH (ref 12.0–15.0)
WBC ADJUSTED: 2.4 10*9/L — ABNORMAL LOW (ref 3.5–10.5)

## 2020-03-08 LAB — MAGNESIUM: Magnesium:MCnc:Pt:Ser/Plas:Qn:: 1.9

## 2020-03-08 LAB — RENAL FUNCTION PANEL
ALBUMIN: 4 g/dL (ref 3.4–5.0)
ANION GAP: 3 mmol/L — ABNORMAL LOW (ref 5–14)
BLOOD UREA NITROGEN: 21 mg/dL (ref 9–23)
CALCIUM: 10.6 mg/dL — ABNORMAL HIGH (ref 8.7–10.4)
CHLORIDE: 114 mmol/L — ABNORMAL HIGH (ref 98–107)
CO2: 22.9 mmol/L (ref 20.0–31.0)
CREATININE: 1.63 mg/dL — ABNORMAL HIGH
EGFR CKD-EPI AA MALE: 50 mL/min/{1.73_m2} — ABNORMAL LOW (ref >=60)
EGFR CKD-EPI NON-AA MALE: 43 mL/min/{1.73_m2} — ABNORMAL LOW (ref >=60)
PHOSPHORUS: 3.5 mg/dL (ref 2.4–5.1)
SODIUM: 140 mmol/L (ref 135–145)

## 2020-03-08 LAB — EGFR CKD-EPI AA MALE
Glomerular filtration rate/1.73 sq M.predicted.black:ArVRat:Pt:Ser/Plas/Bld:Qn:Creatinine-based formula (CKD-EPI): 50 — ABNORMAL LOW

## 2020-03-08 LAB — SPHEROCYTES

## 2020-03-08 LAB — LYMPHOCYTES RELATIVE PERCENT: Lymphocytes/100 leukocytes:NFr:Pt:Bld:Qn:Automated count: 12

## 2020-03-08 LAB — TACROLIMUS BLOOD: Lab: 6.7

## 2020-03-09 DIAGNOSIS — Z940 Kidney transplant status: Principal | ICD-10-CM

## 2020-03-09 LAB — CMV DNA, QUANTITATIVE, PCR: CMV QUANT: 76 [IU]/mL — ABNORMAL HIGH (ref <0)

## 2020-03-09 LAB — CMV COMMENT: Lab: 0

## 2020-03-09 MED ORDER — MYCOPHENOLATE SODIUM 180 MG TABLET,DELAYED RELEASE
ORAL_TABLET | Freq: Two times a day (BID) | ORAL | 11 refills | 30.00000 days
Start: 2020-03-09 — End: 2021-03-09

## 2020-03-09 NOTE — Unmapped (Signed)
[facility] Refill Coordination Note    Specialty Medication(s) to be Shipped:   Transplant: Envarsus 4mg , mycophenolate mofetil 180mg  and valgancyclovir 450mg    **Sent rf request for mycophenolate**    Other medication(s) to be shipped: No additional medications requested for fill at this time     Frank Leach, DOB: [birth year]  Phone: [phone] (home)       All above HIPAA information was verified with patient.     Was a Nurse, learning disability used for this call? No    Completed refill call assessment today to schedule patient's medication shipment from the [facility] [phone]).       Specialty medication(s) and dose(s) confirmed: Regimen is correct and unchanged.   Changes to medications: Frank Leach reports no changes at this time.  Changes to insurance: No  Questions for the pharmacist: No    Confirmed patient received Welcome Packet with first shipment. The patient will receive a drug information handout for each medication shipped and additional FDA Medication Guides as required.       DISEASE/MEDICATION-SPECIFIC INFORMATION        N/A    SPECIALTY MEDICATION ADHERENCE     Medication Adherence    Patient reported X missed doses in the last month: 0  Specialty Medication: Valganciclovir 450mg   Patient is on additional specialty medications: Yes  Additional Specialty Medications: Envarsus 4mg   Patient Reported Additional Medication X Missed Doses in the Last Month: 0  Patient is on more than two specialty medications: Yes  Specialty Medication: Mycophenolate 180mg   Patient Reported Additional Medication X Missed Doses in the Last Month: 0        Envarsus 4 mg: 8 days of medicine on hand   Mycophenolate 180 mg: 8 days of medicine on hand   Valganciclovir 450 mg: 8 days of medicine on hand     SHIPPING     Shipping address confirmed in Epic.     Delivery Scheduled: Yes, Expected medication delivery date: 03/16/2020.     Medication will be delivered via UPS to the prescription address in Epic WAM.    Frank Leach [facility] Specialty Technician

## 2020-03-10 ENCOUNTER — Encounter: Admit: 2020-03-10 | Discharge: 2020-03-11 | Payer: MEDICARE

## 2020-03-10 DIAGNOSIS — Z940 Kidney transplant status: Principal | ICD-10-CM

## 2020-03-10 LAB — BLOOD GAS CRITICAL CARE PANEL, VENOUS
BASE EXCESS VENOUS: -3.8 — ABNORMAL LOW (ref -2.0–2.0)
CALCIUM IONIZED VENOUS (MG/DL): 5.49 mg/dL — ABNORMAL HIGH (ref 4.40–5.40)
GLUCOSE WHOLE BLOOD: 44 mg/dL
HCO3 VENOUS: 21 mmol/L — ABNORMAL LOW (ref 22–27)
HEMOGLOBIN BLOOD GAS: 12.4 g/dL — ABNORMAL LOW (ref 13.50–17.50)
LACTATE BLOOD VENOUS: 1.2 mmol/L (ref 0.5–1.8)
O2 SATURATION VENOUS: 66 % (ref 40.0–85.0)
PCO2 VENOUS: 45 mmHg (ref 40–60)
PH VENOUS: 7.31 — ABNORMAL LOW (ref 7.32–7.43)
PO2 VENOUS: 37 mmHg (ref 30–55)
SODIUM WHOLE BLOOD: 140 mmol/L (ref 135–145)

## 2020-03-10 LAB — CBC W/ AUTO DIFF
BASOPHILS ABSOLUTE COUNT: 0.1 10*9/L (ref 0.0–0.1)
BASOPHILS RELATIVE PERCENT: 2.3 %
EOSINOPHILS ABSOLUTE COUNT: 0.1 10*9/L (ref 0.0–0.7)
EOSINOPHILS RELATIVE PERCENT: 1.6 %
HEMATOCRIT: 37.4 % — ABNORMAL LOW (ref 38.0–50.0)
LYMPHOCYTES ABSOLUTE COUNT: 0.2 10*9/L — ABNORMAL LOW (ref 0.7–4.0)
LYMPHOCYTES RELATIVE PERCENT: 6.8 %
MEAN CORPUSCULAR HEMOGLOBIN CONC: 32.4 g/dL (ref 30.0–36.0)
MEAN CORPUSCULAR HEMOGLOBIN: 30.4 pg (ref 26.0–34.0)
MEAN CORPUSCULAR VOLUME: 94 fL (ref 81.0–95.0)
MEAN PLATELET VOLUME: 7.2 fL (ref 7.0–10.0)
MONOCYTES ABSOLUTE COUNT: 0 10*9/L — ABNORMAL LOW (ref 0.1–1.0)
MONOCYTES RELATIVE PERCENT: 1.5 %
NEUTROPHILS ABSOLUTE COUNT: 2.8 10*9/L (ref 1.7–7.7)
NEUTROPHILS RELATIVE PERCENT: 87.8 %
NUCLEATED RED BLOOD CELLS: 0 /100{WBCs} (ref <=4)
PLATELET COUNT: 282 10*9/L (ref 150–450)
RED CELL DISTRIBUTION WIDTH: 21.3 % — ABNORMAL HIGH (ref 12.0–15.0)
WBC ADJUSTED: 3.2 10*9/L — ABNORMAL LOW (ref 3.5–10.5)

## 2020-03-10 LAB — ANISOCYTOSIS

## 2020-03-10 LAB — COMPREHENSIVE METABOLIC PANEL
ALBUMIN: 4.2 g/dL (ref 3.4–5.0)
ALKALINE PHOSPHATASE: 84 U/L (ref 46–116)
ALT (SGPT): 16 U/L (ref 10–49)
ANION GAP: 5 mmol/L (ref 5–14)
BILIRUBIN TOTAL: 0.3 mg/dL (ref 0.3–1.2)
BLOOD UREA NITROGEN: 19 mg/dL (ref 9–23)
BUN / CREAT RATIO: 13
CALCIUM: 10.6 mg/dL — ABNORMAL HIGH (ref 8.7–10.4)
CHLORIDE: 111 mmol/L — ABNORMAL HIGH (ref 98–107)
CO2: 22.2 mmol/L (ref 20.0–31.0)
CREATININE: 1.5 mg/dL — ABNORMAL HIGH
EGFR CKD-EPI AA MALE: 55 mL/min/{1.73_m2} — ABNORMAL LOW (ref >=60)
EGFR CKD-EPI NON-AA MALE: 47 mL/min/{1.73_m2} — ABNORMAL LOW (ref >=60)
GLUCOSE RANDOM: 44 mg/dL — ABNORMAL LOW (ref 70–179)
POTASSIUM: 4.9 mmol/L — ABNORMAL HIGH (ref 3.4–4.5)
PROTEIN TOTAL: 8.5 g/dL — ABNORMAL HIGH (ref 5.7–8.2)
SODIUM: 138 mmol/L (ref 135–145)

## 2020-03-10 LAB — HCO3 VENOUS: Bicarbonate:SCnc:Pt:BldA:Qn:: 21 — ABNORMAL LOW

## 2020-03-10 LAB — CREATININE: Creatinine:MCnc:Pt:Ser/Plas:Qn:: 1.5 — ABNORMAL HIGH

## 2020-03-10 LAB — C-PEPTIDE
C-PEPTIDE: 9 ng/mL — ABNORMAL HIGH (ref 0.48–5.05)
Lab: 9 — ABNORMAL HIGH

## 2020-03-10 MED ORDER — MYCOPHENOLATE SODIUM 180 MG TABLET,DELAYED RELEASE
ORAL_TABLET | Freq: Two times a day (BID) | ORAL | 11 refills | 30 days | Status: CP
Start: 2020-03-10 — End: ?
  Filled 2020-03-15: qty 60, 30d supply, fill #0

## 2020-03-10 NOTE — Unmapped (Signed)
Patient's wife called to speak with a coordinator as [name] is out of town. They had to call the EMS out for him last night, he is at home but they instructed him to call his nurse coordinator to advise of everything that happened. Advised I'd page on call coordinator to give them a call back.  *Paged Baxter Hire

## 2020-03-10 NOTE — Unmapped (Signed)
Per dr Nestor Lewandowsky stop glipizide     Called and updated patient to stop due to low BS.    Verbalized understanding

## 2020-03-10 NOTE — Unmapped (Signed)
Please see refill request. Thanks.

## 2020-03-10 NOTE — Unmapped (Signed)
Returned on call page. Spoke with patients wife who said patients was disoriented around 0400 this morning. Wife called EMS, upon EMS arrival, patient was assessed and BG was checked. BG reading 36. Patient was given packets of sugar water by EMS and BG recheck 212. Patient was not taken to ED and was asked to contact his primary TNC this am to let her know what happened.  BG recheck at 0915 - 119. When asked how often patient was checking his BG, patients wife said every 3 days and that he has not had much of an appetite.  I requested that for the next couple of days she check it 2-3 times, just to make sure he is not having drops.  I spoke with patient and encouraged him to eat and drink and check BG more frequently. Patient and wife verbalized understanding. Message routed to primary TNC and covering TNC's.

## 2020-03-10 NOTE — Unmapped (Signed)
Called to check in with patient after his BS dropped yesterday. Reports he feels better today. Eating good. Discussed importance of eating balanced meals and to let us know if he has anymore low BS episodes.    Denies any other needs

## 2020-03-11 LAB — BASIC METABOLIC PANEL
ANION GAP: 3 mmol/L — ABNORMAL LOW (ref 5–14)
BLOOD UREA NITROGEN: 15 mg/dL (ref 9–23)
BUN / CREAT RATIO: 10
CHLORIDE: 109 mmol/L — ABNORMAL HIGH (ref 98–107)
CO2: 21 mmol/L (ref 20.0–31.0)
CREATININE: 1.46 mg/dL — ABNORMAL HIGH
EGFR CKD-EPI AA MALE: 57 mL/min/{1.73_m2} — ABNORMAL LOW (ref >=60)
EGFR CKD-EPI NON-AA MALE: 49 mL/min/{1.73_m2} — ABNORMAL LOW (ref >=60)
GLUCOSE RANDOM: 156 mg/dL (ref 70–179)
POTASSIUM: 5.7 mmol/L — ABNORMAL HIGH (ref 3.4–4.5)
SODIUM: 133 mmol/L — ABNORMAL LOW (ref 135–145)

## 2020-03-11 LAB — CBC
HEMATOCRIT: 36.9 % — ABNORMAL LOW (ref 41.0–53.0)
HEMOGLOBIN: 11.2 g/dL — ABNORMAL LOW (ref 13.5–17.5)
MEAN CORPUSCULAR HEMOGLOBIN: 30.4 pg (ref 26.0–34.0)
MEAN CORPUSCULAR VOLUME: 100.3 fL — ABNORMAL HIGH (ref 80.0–100.0)
MEAN PLATELET VOLUME: 9 fL (ref 7.0–10.0)
PLATELET COUNT: 291 10*9/L (ref 150–440)
RED BLOOD CELL COUNT: 3.67 10*12/L — ABNORMAL LOW (ref 4.50–5.90)
RED CELL DISTRIBUTION WIDTH: 20.5 % — ABNORMAL HIGH (ref 12.0–15.0)
WBC ADJUSTED: 3 10*9/L — ABNORMAL LOW (ref 4.5–11.0)

## 2020-03-11 LAB — MEAN CORPUSCULAR HEMOGLOBIN CONC: Erythrocyte mean corpuscular hemoglobin concentration:MCnc:Pt:RBC:Qn:Automated count: 30.3 — ABNORMAL LOW

## 2020-03-11 LAB — INSULIN LEVEL: Insulin:ACnc:Pt:Ser/Plas:Qn:: 43.1 — ABNORMAL HIGH

## 2020-03-11 LAB — TACROLIMUS, TROUGH: Lab: 5.2

## 2020-03-11 LAB — CORTISOL: CORTISOL TOTAL: 7.9 ug/dL

## 2020-03-11 LAB — BLOOD UREA NITROGEN: Urea nitrogen:MCnc:Pt:Ser/Plas:Qn:: 15

## 2020-03-11 LAB — CORTISOL TOTAL: Cortisol:MCnc:Pt:Ser/Plas:Qn:: 7.9

## 2020-03-11 MED ADMIN — valGANciclovir (VALCYTE) tablet 450 mg: 450 mg | ORAL | @ 03:00:00

## 2020-03-11 MED ADMIN — carvediloL (COREG) tablet 12.5 mg: 12.5 mg | ORAL | @ 03:00:00

## 2020-03-11 MED ADMIN — dextrose 5 % and sodium chloride 0.45 % infusion: 100 mL/h | INTRAVENOUS | @ 07:00:00 | Stop: 2020-03-11

## 2020-03-11 MED ADMIN — allopurinoL (ZYLOPRIM) tablet 100 mg: 100.0 mg | ORAL | @ 03:00:00

## 2020-03-11 MED ADMIN — dextrose 50 % in water (D50W) 50 % solution 50 mL: 50 mL | INTRAVENOUS | @ 02:00:00 | Stop: 2020-03-10

## 2020-03-11 MED ADMIN — heparin (porcine) 5,000 unit/mL injection 5,000 Units: 5000 [IU] | SUBCUTANEOUS | @ 18:00:00 | Stop: 2020-03-11

## 2020-03-11 MED ADMIN — mycophenolate (MYFORTIC) EC tablet 180 mg: 180 mg | ORAL | @ 03:00:00

## 2020-03-11 MED ADMIN — pantoprazole (PROTONIX) EC tablet 20 mg: 20 mg | ORAL | @ 12:00:00 | Stop: 2020-03-11

## 2020-03-11 MED ADMIN — aspirin chewable tablet 81 mg: 81 mg | ORAL | @ 12:00:00 | Stop: 2020-03-11

## 2020-03-11 MED ADMIN — dextrose 5 % and sodium chloride 0.45 % infusion: 125 mL/h | INTRAVENOUS | @ 01:00:00 | Stop: 2020-03-10

## 2020-03-11 MED ADMIN — dextrose 5 % and sodium chloride 0.45 % infusion: 150 mL/h | INTRAVENOUS | @ 03:00:00

## 2020-03-11 MED ADMIN — atorvastatin (LIPITOR) tablet 40 mg: 40 mg | ORAL | @ 12:00:00 | Stop: 2020-03-11

## 2020-03-11 MED ADMIN — octreotide (SandoSTATIN) injection 100 mcg: 100 ug | SUBCUTANEOUS | @ 03:00:00

## 2020-03-11 MED ADMIN — tacrolimus (ENVARSUS XR) extended release tablet 8 mg: 8 mg | ORAL | @ 12:00:00 | Stop: 2020-03-11

## 2020-03-11 MED ADMIN — cholecalciferol (vitamin D3-125 mcg (5,000 unit)) tablet 125 mcg: 125 ug | ORAL | @ 12:00:00 | Stop: 2020-03-11

## 2020-03-11 MED ADMIN — dextrose 50 % in water (D50W) 50 % solution 50 mL: 50 mL | INTRAVENOUS | Stop: 2020-03-10

## 2020-03-11 MED ADMIN — amLODIPine (NORVASC) tablet 10 mg: 10 mg | ORAL | @ 12:00:00 | Stop: 2020-03-11

## 2020-03-11 MED ADMIN — carvediloL (COREG) tablet 12.5 mg: 12.5 mg | ORAL | @ 12:00:00 | Stop: 2020-03-11

## 2020-03-11 NOTE — Unmapped (Addendum)
Frank Leach is a 69 y.o. male with PMHx of ESRD 2/2 to HTN previously requiring iHD s/p transplant 06/22/19 that presented to [facility] with persistent hypoglycemia after starting glipizide on 8/2.    Hypoglycemia  Patient has a history of T2DM on metformin 1000mg  BID and previously semaglutide. His semaglutide was discontinue on 8/2 and was subsequently started on glipizide. Patient noted decreased blood sugar levels and spoke to transplant nephrology team. They had advised to discontinue glipizide due to hypoglycemia. He took another dose of the glipizide on Wednesday by mistake. He woke up at 4AM (8/5) very disoriented. His blood glucose was 36. EMS subsequently called. His blood glucose increased to 100 s/p sugar water. Rechecks were consistently less than 50 and were minimally responsive to sugar water. He was then taken to the [facility] ED. On presentation had a blood glucose of 40. He had small improvement with D50 and was put on a D5 infusion and transferred to [facility]. He received one dose of octreotide and his blood glucose increased to greater than 100. He was placed on q2hr glucose checks. His blood glucose at 3AM was 178. Repeat blood glucose prior to discharge was 135. His metformin and glipizide were discontinued in the setting of hypoglycemia. His Transplant Nephrology team was informed about your admission and treatment. They will reach out to you for a follow-up appointment.     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 Baseline creatinine of 1.5. Creatinine was 1.5 at presentation to the ED. Compliant with transplant medications. Continued on Myfortic 180 mg BID, Tacrolimus XR 8 mg daily. Repeat Tacrolimus Trough: 5.2. Continued CMV prophylaxis with Valgangciclovir 450 mg BID.     Chronic Problems:  HTN: Amlodipine 10mg  daily, Coreg 12.5mg  BID  HLD: Atorvastatin 40mg , ASA 81mg   Gout: Allopurinol 100mg  daily  LLE swelling: PVLs done in July were negative for DVT

## 2020-03-11 NOTE — Unmapped (Signed)
[facility] Emergency Department Provider Note      ED Course, Assessment and Plan     Initial Clinical Impression:    March 10, 2020 7:32 PM   Frank Leach is a 69 y.o. 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     On exam, Vital signs stable.  Overall well-appearing.  Normal cardiopulmonary exam.  Normal abdominal exam.  Normal neurologic exam.  Exam remarkable for mild tremor    BP 162/71  - Pulse 75  - Temp 37 ??C (98.6 ??F) (Oral)  - Resp 18  - Ht 170.2 cm (5' 7)  - Wt 70.3 kg (155 lb)  - SpO2 100%  - BMI 24.28 kg/m??     Patient has persistent hypoglycemia since this morning.  He has had multiple low blood sugar readings despite drinking a lot of juice and eating several times at home.  This has been going on for 12 hours.  He last took glipizide less than 2 hours ago.  Given he is on sulfonylurea is having persistent hypoglycemia will give him D50 and start him on D10 half-normal saline drip.  Consider octreotide but will defer at this time given the patient appears well and is not confused.  We will plan for admission    ED Course:  Admitted for further management          _____________________________________________________________________    The case was discussed with attending physician who is in agreement with the above assessment and plan    Dictation software was used while making this note. Please excuse any errors made with dictation software.    Additional Medical Decision Making     I have reviewed the vital signs and the nursing notes. Labs and radiology results that were available during my care of the patient were independently reviewed by me and considered in my medical decision making.     I independently visualized the EKG tracing if performed  I independently visualized the radiology images if performed  I reviewed the patient's prior medical records if available.  Additional history obtained from family if available    History     CHIEF COMPLAINT:   Chief Complaint   Patient presents with    Decreased Blood Glucose Symptomatic       HPI: Frank Leach is a 69 y.o. 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 The patient states that his blood glucose was measured to be 36 this morning. Per wife at bedside, he appeared to have some confusion at this time which has since resolved    36 this morning. EMS gave sugar. 40 this evening. Drank a couple glasses on orange juice with minimal improvement    Doctor said to discontinue use of Glipizide. Took a dose this morning   Confused this morning  Not on insulin    Denies recent travels or known COVID-19 contacts. Further denies fever, chills, shortness of breath, chest pain, cough, nausea, vomiting, diarrhea, abdominal pain, numbness/tingling, vision changes, urinary changes, bowel incontinence, skin changes, headache, syncope, swelling, diaphoresis, or changes in appetite.      PAST MEDICAL HISTORY/PAST SURGICAL HISTORY:   Past Medical History:   Diagnosis Date    Anemia  Cancer (CMS-HCC)     clear cell adenocarcinoma of left kidney    Diabetes mellitus (CMS-HCC)     Diabetic nephropathy (CMS-HCC)     ESRD (end stage renal disease) on dialysis (CMS-HCC)     ESRD on dialysis (CMS-HCC)     Gout     Hyperlipidemia     Hypertension        Past Surgical History:   Procedure Laterality Date    AV FISTULA PLACEMENT      PR COLONOSCOPY FLX DX W/COLLJ SPEC WHEN PFRMD N/A 04/30/2018    Procedure: COLONOSCOPY, FLEXIBLE, PROXIMAL TO SPLENIC FLEXURE; DIAGNOSTIC, W/WO COLLECTION SPECIMEN BY BRUSH OR WASH;  Surgeon: Luanne Bras, MD;  Location: [facility] PROCEDURES [facility];  Service: Gastroenterology    PR COLONOSCOPY W/BIOPSY SINGLE/MULTIPLE N/A 04/30/2018    Procedure: COLONOSCOPY, FLEXIBLE, PROXIMAL TO SPLENIC FLEXURE; WITH BIOPSY, SINGLE OR MULTIPLE;  Surgeon: Luanne Bras, MD;  Location: [facility] PROCEDURES [facility];  Service: Gastroenterology    PR COLSC FLX W/RMVL OF TUMOR POLYP LESION SNARE TQ N/A 04/30/2018    Procedure: COLONOSCOPY FLEX; W/REMOV TUMOR/LES BY SNARE;  Surgeon: Luanne Bras, MD;  Location: [facility] PROCEDURES [facility];  Service: Gastroenterology    PR EXPLORATORY OF ABDOMEN Midline 04/14/2016    Procedure: EXPLORATORY LAPAROTOMY, EXPLORATORY CELIOTOMY WITH OR WITHOUT BIOPSY(S);  Surgeon: Leona Carry, MD;  Location: MAIN OR [facility];  Service: Transplant    PR EXPLORATORY OF ABDOMEN N/A 04/16/2016    Procedure: EXPLORATORY LAPAROTOMY, EXPLORATORY CELIOTOMY WITH OR WITHOUT BIOPSY(S);  Surgeon: Leona Carry, MD;  Location: MAIN OR [facility];  Service: Transplant    PR LAP, RADICAL NEPHRECTOMY Right 12/27/2016    Procedure: Robotic Xi Laparoscopy; Radical Nephrectomy (Incl Remove Gerota`S Fascia, Fatty Tissue, Reg Lymph Node, Adrenalectomy);  Surgeon: Tomie China, MD;  Location: MAIN OR [facility];  Service: Urology    PR NEGATIVE PRESSURE WOUND THERAPY DME >50 SQ CM N/A 04/12/2016    Procedure: NEG PRESS WOUND TX (VAC ASSIST) INCL TOPICALS, PER SESSION, TSA GREATER THAN/= 50 CM SQUARED;  Surgeon: Leona Carry, MD;  Location: MAIN OR [place];  Service: Transplant    PR NEGATIVE PRESSURE WOUND THERAPY DME >50 SQ CM Bilateral 04/14/2016    Procedure: NEG PRESS WOUND TX (VAC ASSIST) INCL TOPICALS, PER SESSION, TSA GREATER THAN/= 50 CM SQUARED;  Surgeon: Leona Carry, MD;  Location: MAIN OR [place];  Service: Transplant    PR NEGATIVE PRESSURE WOUND THERAPY DME >50 SQ CM N/A 04/16/2016    Procedure: NEG PRESS WOUND TX (VAC ASSIST) INCL TOPICALS, PER SESSION, TSA GREATER THAN/= 50 CM SQUARED;  Surgeon: Leona Carry, MD;  Location: MAIN OR [facility];  Service: Transplant    PR NEPHRECTOMY, W/PART. URETECTOMY Bilateral 04/11/2016    Procedure: LAPAROSCOPY, SURGICAL, NEPHRECTOMY WITH TOTAL URETERECTOMY;  Surgeon: Leona Carry, MD;  Location: MAIN OR [facility];  Service: Transplant    PR REMV KIDNEY,W/RIB RESECTION Bilateral 04/11/2016    Procedure: NEPHRECTOMY, INCLUDING PARTIAL URETERECTOMY, ANY OPEN APPROACH INCLUDING RIB RESECTION;  Surgeon: Leona Carry, MD;  Location: MAIN OR [facility];  Service: Transplant    PR TRANSPLANT,PREP LIVING  RENAL GRAFT N/A 06/22/2019    Procedure: BACKBENCH STD PREP LIVING DONOR RENAL ALLGRFT (OPEN/LAPROSC) PRIOR TO TRANSPLANT, INC DISSECT/REM AS NECESS;  Surgeon: Leona Carry, MD;  Location: MAIN OR [facility];  Service: Transplant    PR TRANSPLANTATION OF KIDNEY N/A 06/22/2019    Procedure: RENAL ALLOTRANSPLANTATION, IMPLANTATION OF GRAFT; WITHOUT  RECIPIENT NEPHRECTOMY;  Surgeon: Leona Carry, MD;  Location: MAIN OR [facility];  Service: Transplant    SPLENECTOMY         MEDICATIONS:     Current Facility-Administered Medications:     sodium chloride bacteriostatic 0.9 % injection 0.1 mL, 0.1 mL, Injection, Once, [place], [place]    Current Outpatient Medications:     acetaminophen (TYLENOL) 500 MG tablet, Take 1-2 tablets (500-1,000 mg total) by mouth every six (6) hours as needed for pain or fever (> 38C). (Patient not taking: Reported on 10/27/2019), Disp: 100 tablet, Rfl: 0    allopurinoL (ZYLOPRIM) 100 MG tablet, Take 1 tablet (100 mg total) by mouth every evening., Disp: 30 tablet, Rfl: 11    amLODIPine (NORVASC) 10 MG tablet, Take 1 tablet (10 mg total) by mouth daily., Disp: 30 tablet, Rfl: 11    aspirin (ECOTRIN) 81 MG tablet, Take 1 tablet (81 mg total) by mouth daily., Disp: 30 tablet, Rfl: 11    atorvastatin (LIPITOR) 40 MG tablet, Take 1 tablet (40 mg total) by mouth daily., Disp: 90 tablet, Rfl: 3    carvediloL (COREG) 6.25 MG tablet, Take 2 tablets (12.5 mg total) by mouth Two (2) times a day., Disp: 360 tablet, Rfl: 3    cholecalciferol, vitamin D3-125 mcg, 5,000 unit,, 125 mcg (5,000 unit) tablet, Take 1 tablet (125 mcg total) by mouth daily., Disp: , Rfl:     ENVARSUS XR 4 mg Tb24 extended release tablet, Take 2 tablets (8 mg total) by mouth daily., Disp: 60 tablet, Rfl: 11    magnesium oxide-Mg AA chelate (MAGNESIUM, AMINO ACID CHELATE,) 133 mg Tab, Take 3 tablets (399 mg total) by mouth two (2) times a day., Disp: 180 tablet, Rfl: 11    metFORMIN (GLUCOPHAGE-XR) 500 MG 24 hr tablet, Take 2 tablets (1,000 mg total) by mouth once daily., Disp: 180 tablet, Rfl: 3    mycophenolate (MYFORTIC) 180 MG EC tablet, Take 1 tablet (180 mg total) by mouth Two (2) times a day., Disp: 60 tablet, Rfl: 11    omeprazole (PRILOSEC) 40 MG capsule, Take 1 capsule (40 mg total) by mouth every other day., Disp: 45 capsule, Rfl: 3    papaverine 16.7 mg/mL phentolamine 0.56 mg/mL alprostadil 5.6 mcg/mL injection, Inject as directed by physician, starting with 0.1 mL. May increase by 0.1 ml with next dose if not effective., Disp: 5 mL, Rfl: 11    valGANciclovir (VALCYTE) 450 mg tablet, Take 1 tablet (450 mg total) by mouth Two (2) times a day., Disp: 60 tablet, Rfl: 5    ALLERGIES:   Patient has no known allergies.    SOCIAL HISTORY:   Social History     Tobacco Use    Smoking status: Never Smoker    Smokeless tobacco: Never Used   Substance Use Topics    Alcohol use: No       FAMILY HISTORY:  Family History   Problem Relation Age of Onset    Kidney disease Mother     Cancer Father     Kidney disease Brother           Review of Systems    A 10 point review of systems was performed and is negative other than positive elements noted in HPI   Constitutional: Negative for fever.  Eyes: Negative for visual changes.  ENT: Negative for sore throat.  Cardiovascular: No chest pain.  Respiratory: Negative for shortness of breath.  Gastrointestinal: Negative for abdominal pain, vomiting  or diarrhea.  Genitourinary: Negative for dysuria.  Musculoskeletal: Negative for back pain.  Skin: Negative for rash.  Neurological: Negative for headaches, focal weakness or numbness.    Physical Exam     VITAL SIGNS:    BP 162/71  - Pulse 75  - Temp 37 ??C (98.6 ??F) (Oral)  - Resp 18  - Ht 170.2 cm (5' 7)  - Wt 70.3 kg (155 lb)  - SpO2 100%  - BMI 24.28 kg/m??     Constitutional: Alert and oriented. Well appearing and in no distress.  Eyes: Conjunctivae are normal.  ENT       Head: Normocephalic and atraumatic.       Nose: No congestion.       Mouth/Throat: Mucous membranes are moist.       Neck: No stridor.  Cardiovascular: Normal rate, regular rhythm.   Respiratory: Normal respiratory effort. Breath sounds are normal.  Gastrointestinal: Soft and nontender.   Genitourinary: No suprapubic tenderness  Musculoskeletal: Normal range of motion in all extremities.   Neurologic: Normal speech and language. No gross focal neurologic deficits are appreciated.  Skin: Skin is warm, dry. No rash noted.  Psychiatric: Mood and affect are normal. Speech and behavior are normal.         Radiology     No orders to display     No results found.        Pertinent labs & imaging results that were available during my care of the patient were reviewed by me and considered in my medical decision making (see chart for details).    Please note- This chart has been created using [employer]. Chart creation errors have been sought, but may not always be located and such creation errors, especially pronoun confusion, do NOT reflect on the standard of medical care.    Documentation assistance was provided by Helen Hashimoto, Scribe, on March 10, 2020 at 7:32 PM for Marveen Reeks, MD.    Documentation assistance was provided by the scribe in my presence.  The documentation recorded by the scribe has been reviewed by me and accurately reflects the services I personally performed.        Marveen Reeks, MD  Resident  03/10/20 [phone]

## 2020-03-11 NOTE — Unmapped (Addendum)
Patient medically cleared for discharged per MD. Patient stable at the time of discharge, vital signs stable, no indications of acute distress noted. PIV discontinued without complications noted. Reviewed patient discharge instructions with patient and loved one (with patient's permission) at bedside - encouraged questions / provided answers, allowed additional time to ensure all questions answered. All patient belongings with patient. Patient is accompanied by significant other who with assist with discharge and drive home. Verbal and written education/instructions provided regarding MD discharge instructions and follow up appointments.    Problem: Adult Inpatient Plan of Care  Goal: Plan of Care Review  Outcome: Discharged to Home  Goal: Patient-Specific Goal (Individualization)  Outcome: Discharged to Home  Goal: Absence of Hospital-Acquired Illness or Injury  Outcome: Discharged to Home  Goal: Optimal Comfort and Wellbeing  Outcome: Discharged to Home  Goal: Readiness for Transition of Care  Outcome: Discharged to Home  Goal: Rounds/Family Conference  Outcome: Discharged to Home     Problem: Wound  Goal: Optimal Wound Healing  Outcome: Discharged to Home     Problem: Glycemic Control Impaired  Goal: Blood Glucose Level Within Desired Range  Outcome: Discharged to Home

## 2020-03-11 NOTE — Unmapped (Addendum)
Adult Nutrition Assessment Note    Visit Type: RN Consult  Reason for Visit: Per Admission Nutrition Screen (Adult), Have you gained or lost 10 pounds in the past 3 months?, Have you had a decrease in food intake or appetite?      HPI & PMH:  Frank Leach is a 69 y.o. male with PMHx of ESRD 2/2 to HTN previously requiring iHD s/p transplant 06/22/19 that presented to [facility] with persistent hypoglycemia after starting glipizide on 8/2.    Anthropometric Data:  Height: 170.2 cm (5' 7)   Admission weight: 70.3 kg (155 lb)  Last recorded weight: 70.3 kg (155 lb)  IBW: 67.2 kg  Percent IBW: 104.63 %  BMI: Body mass index is 24.28 kg/m??.   Usual Body Weight: ~180 lbs around kidney tranplant    Weight history prior to admission: 16-lb (9%) weight loss in 7 months (not using reported weight) - not significant ; documented 08/04 wt is a reported weight    Wt Readings from Last 10 Encounters:   03/10/20 70.3 kg (155 lb)   02/25/20 73.5 kg (162 lb)   01/15/20 74.6 kg (164 lb 6.4 oz)   11/27/19 77.2 kg (170 lb 3.2 oz)   10/09/19 76.8 kg (169 lb 6.4 oz)   08/26/19 80.1 kg (176 lb 9.6 oz)   08/26/19 80.1 kg (176 lb 9.6 oz)   07/29/19 81.1 kg (178 lb 12.7 oz)   07/29/19 81.1 kg (178 lb 11.2 oz)   07/29/19 81.1 kg (178 lb 11.2 oz)        Weight changes this admission:   Last 5 Recorded Weights    03/10/20 1920   Weight: 70.3 kg (155 lb)        Nutrition Focused Physical Exam:   Pending due to arrival of breakfast (first meal this admission)            Nutrition Evaluation  Nutrition Designation: Normal weight (BMI 18.50 - 24.99 kg/m2) (03/11/20 1445)      NUTRITIONALLY RELEVANT DATA     Medications:   Nutritionally pertinent medications reviewed and evaluated for potential food and/or medication interactions and include cholecalciferol, protonix.     Labs:   Nutritionally pertinent labs reviewed and include Na: 133 mmol/L and K+: 5.7 mmol/L    Nutrition History:   Prior to admission: Patient reports so-so appetite and decreased food intake in setting of diet modification related to kidney tranplant. Endorses being a big eater before dialysis/transplant but now intentionally monitors food intake (eating less potatoes which he usually loves). States that he gets full quickly and consumes as tolerated. Patient says that he has been consuming ~70% of usual intake for 3 months resulting in a ~15-lb weight loss. Denies N/V, abdominal pain, chewing/swallowing problems. No diarrhea/constipation.     Allergies, Intolerances, Sensitivities, and/or Cultural/Religious Dietary Restrictions: none identified at this time     Current Nutrition:  Oral intake        Nutrition Orders   (From admission, onward)             Start     Ordered    03/11/20 0846  Nutrition Therapy Regular/House  Effective now     Question:  Nutrition Therapy:  Answer:  Regular/House    03/11/20 0845                 Malnutrition Assessment using AND/ASPEN Clinical Characteristics:  Pending due to need for new actual weight and availability of patient for nutrition-focused physical  exam.       GOALS and EVALUATION     ??? Patient to meet 75% or greater of nutrient needs via combination of meals, snacks, and/or oral supplements within 7 days.  - New    Evaluation of motivation, barriers, or compliance completed. No concerns identified at this time.     NUTRITION ASSESSMENT     ??? Current oral intake is not meeting nutritional needs at this time due to recent admission (<1 day) and diet order (1 hour before visit).  ??? Patient would benefit from diet change to potassium restricted diet in setting of continued hyperkalemia.   ??? Blood glucose controlled with insulin administration.     Motivation, Barriers, and Compliance  Evaluation of motivation, barriers, or compliance completed. No concerns identified at this time.     Discharge Planning:   Monitor for potential discharge needs with multi-disciplinary team.       NUTRITION INTERVENTIONS and RECOMMENDATION     1. Recommend potassium restricted diet.  2. Consider oral supplements based on po intake.   3. Monitor electrolytes (particularly potassium) and glucose.   4. Please obtain a new weight.     Follow-Up Parameters:   1-2 times per week (and more frequent as indicated)    Frank Leach, MPH, RD, LDN  Pager # [phone]

## 2020-03-11 NOTE — Unmapped (Signed)
Tacrolimus Therapeutic Monitoring Pharmacy Note    Antonieta Loveless is a 69 y.o. male continuing tacrolimus.     Indication: Kidney transplant     Date of Transplant: 06/22/2019      Prior Dosing Information: Home regimen Envarsus 8mg  daily     Goals:  Therapeutic Drug Levels  Tacrolimus trough goal: 6-8 ng/mL    Additional Clinical Monitoring/Outcomes  ?? Monitor renal function (SCr and urine output) and liver function (LFTs)  ?? Monitor for signs/symptoms of adverse events (e.g., hyperglycemia, hyperkalemia, hypomagnesemia, hypertension, headache, tremor)    Results:   Tacrolimus level: 6.7 ng/mL, drawn 03/08/20     Pharmacokinetic Considerations and Significant Drug Interactions:  ??? Concurrent hepatotoxic medications: None identified  ??? Concurrent CYP3A4 substrates/inhibitors: None identified  ??? Concurrent nephrotoxic medications: None identified    Assessment/Plan:  Recommendedation(s)  ??? Continue current regimen of Envarsus 8mg  daily    Follow-up  ??? Next level to be determined by primary team.   ??? A pharmacist will continue to monitor and recommend levels as appropriate    Please page service pharmacist with questions/clarifications.    [place] K Donnald Tabar, PharmD

## 2020-03-11 NOTE — Unmapped (Signed)
Social Work  Psychosocial Assessment    Patient Name: Frank Leach   Medical Record Number: [id number]   Date of Birth: 01-18-1951  Sex: Male     Referral  Reason for Referral: Complex Discharge Planning  No Psychosocial Interventions Necessary: No Psychosocial Interventions Necessary    Extended Emergency Contact Information  Primary Emergency Contact: [place]  Home Phone: [phone]  Mobile Phone: [phone]  Relation: Spouse  Secondary Emergency Contact: Parquet,Cheryl   [place]  Mobile Phone: [phone]  Relation: Daughter    Legal Next of Kin / Guardian / POA / Advance Directives      Advance Directive (Medical Treatment)  Does patient have an advance directive covering medical treatment?: Patient does not have advance directive covering medical treatment.    Health Care Decision Maker [HCDM] (Medical & Mental Health Treatment)  Healthcare Decision Maker: HCDM documented in the HCDM/Contact Info section.  Information offered on HCDM, Medical & Mental Health advance directives:: Patient declined information.         Discharge Planning  Discharge Planning Information:   Type of Residence   Mailing Address:  [address]  [place] [address]    Medical Information   Past Medical History:   Diagnosis Date   ??? Anemia    ??? Cancer (CMS-HCC)     clear cell adenocarcinoma of left kidney   ??? Diabetes mellitus (CMS-HCC)    ??? Diabetic nephropathy (CMS-HCC)    ??? ESRD (end stage renal disease) on dialysis (CMS-HCC)    ??? ESRD on dialysis (CMS-HCC)    ??? Gout    ??? Hyperlipidemia    ??? Hypertension        Past Surgical History:   Procedure Laterality Date   ??? AV FISTULA PLACEMENT     ??? PR COLONOSCOPY FLX DX W/COLLJ SPEC WHEN PFRMD N/A 04/30/2018    Procedure: COLONOSCOPY, FLEXIBLE, PROXIMAL TO SPLENIC FLEXURE; DIAGNOSTIC, W/WO COLLECTION SPECIMEN BY BRUSH OR WASH;  Surgeon: Luanne Bras, MD;  Location: [facility];  Service: Gastroenterology   ??? PR COLONOSCOPY W/BIOPSY SINGLE/MULTIPLE N/A 04/30/2018    Procedure: COLONOSCOPY, FLEXIBLE, PROXIMAL TO SPLENIC FLEXURE; WITH BIOPSY, SINGLE OR MULTIPLE;  Surgeon: Luanne Bras, MD;  Location: [facility];  Service: Gastroenterology   ??? PR COLSC FLX W/RMVL OF TUMOR POLYP LESION SNARE TQ N/A 04/30/2018    Procedure: COLONOSCOPY FLEX; W/REMOV TUMOR/LES BY SNARE;  Surgeon: Luanne Bras, MD;  Location: [facility];  Service: Gastroenterology   ??? PR EXPLORATORY OF ABDOMEN Midline 04/14/2016    Procedure: EXPLORATORY LAPAROTOMY, EXPLORATORY CELIOTOMY WITH OR WITHOUT BIOPSY(S);  Surgeon: Leona Carry, MD;  Location: MAIN OR [facility];  Service: Transplant   ??? PR EXPLORATORY OF ABDOMEN N/A 04/16/2016    Procedure: EXPLORATORY LAPAROTOMY, EXPLORATORY CELIOTOMY WITH OR WITHOUT BIOPSY(S);  Surgeon: Leona Carry, MD;  Location: MAIN OR [facility];  Service: Transplant   ??? PR LAP, RADICAL NEPHRECTOMY Right 12/27/2016    Procedure: Robotic Xi Laparoscopy; Radical Nephrectomy (Incl Remove Gerota`S Fascia, Fatty Tissue, Reg Lymph Node, Adrenalectomy);  Surgeon: Tomie China, MD;  Location: MAIN OR [facility];  Service: Urology   ??? PR NEGATIVE PRESSURE WOUND THERAPY DME >50 SQ CM N/A 04/12/2016    Procedure: NEG PRESS WOUND TX (VAC ASSIST) INCL TOPICALS, PER SESSION, TSA GREATER THAN/= 50 CM SQUARED;  Surgeon: Leona Carry, MD;  Location: MAIN OR [facility];  Service: Transplant   ???  PR NEGATIVE PRESSURE WOUND THERAPY DME >50 SQ CM Bilateral 04/14/2016    Procedure: NEG PRESS WOUND TX (VAC ASSIST) INCL TOPICALS, PER SESSION, TSA GREATER THAN/= 50 CM SQUARED;  Surgeon: Leona Carry, MD;  Location: MAIN OR [facility];  Service: Transplant   ??? PR NEGATIVE PRESSURE WOUND THERAPY DME >50 SQ CM N/A 04/16/2016    Procedure: NEG PRESS WOUND TX (VAC ASSIST) INCL TOPICALS, PER SESSION, TSA GREATER THAN/= 50 CM SQUARED;  Surgeon: Leona Carry, MD;  Location: MAIN OR [facility];  Service: Transplant   ??? PR NEPHRECTOMY, W/PART. URETECTOMY Bilateral 04/11/2016    Procedure: LAPAROSCOPY, SURGICAL, NEPHRECTOMY WITH TOTAL URETERECTOMY;  Surgeon: Leona Carry, MD;  Location: MAIN OR [facility];  Service: Transplant   ??? PR REMV KIDNEY,W/RIB RESECTION Bilateral 04/11/2016    Procedure: NEPHRECTOMY, INCLUDING PARTIAL URETERECTOMY, ANY OPEN APPROACH INCLUDING RIB RESECTION;  Surgeon: Leona Carry, MD;  Location: MAIN OR [facility];  Service: Transplant   ??? PR TRANSPLANT,PREP LIVING  RENAL GRAFT N/A 06/22/2019    Procedure: [facility] STD PREP LIVING DONOR RENAL ALLGRFT (OPEN/LAPROSC) PRIOR TO TRANSPLANT, INC DISSECT/REM AS NECESS;  Surgeon: Leona Carry, MD;  Location: [facility];  Service: Transplant   ??? PR TRANSPLANTATION OF KIDNEY N/A 06/22/2019    Procedure: RENAL ALLOTRANSPLANTATION, IMPLANTATION OF GRAFT; WITHOUT RECIPIENT NEPHRECTOMY;  Surgeon: Leona Carry, MD;  Location: MAIN OR [facility];  Service: Transplant   ??? SPLENECTOMY         Family History   Problem Relation Age of Onset   ??? Kidney disease Mother    ??? Cancer Father    ??? Kidney disease Brother        Network engineer Insurance: Payor: MEDICARE / Plan: MEDICARE PART A AND PART B / Product Type: *No Product type* /    Secondary Insurance: Secondary Nutritional therapist   Prescription Coverage: Nurse, learning disability (listed above)   Preferred Pharmacy: [facility]  [facility]  [facility] - [place], [place] - [address]  [facility]    Barriers to taking medication: no concerns noted    Transition Home   Transportation at time of discharge: Family/Friend's Sales executive    Anticipated changes related to Illness: none   Services in place prior to admission: N/A   Services anticipated for DC: N/A   Hemodialysis Prior to Admission: No    Readmission  Risk of Unplanned Readmission Score: UNPLANNED READMISSION SCORE: 14%  Readmitted Within the Last 30 Days?   Readmission Factors include: unable to assess    Social Determinants of Health  Social Determinants of Health     Tobacco Use: Low Risk    ??? Smoking Tobacco Use: Never Smoker   ??? Smokeless Tobacco Use: Never Used   Alcohol Use:    ??? How often do you have a drink containing alcohol?:    ??? How many drinks containing alcohol do you have on a typical day when you are drinking?:    ??? How often do you have 5 or more drinks on one occasion?:    Financial Resource Strain:    ??? Difficulty of Paying Living Expenses:    Food Insecurity: No Food Insecurity   ??? Worried About Programme researcher, broadcasting/film/video in the Last Year: Never true   ??? Ran Out of Food in the Last Year: Never true   Transportation Needs:    ??? Lack  of Transportation (Medical):    ??? Lack of Transportation (Non-Medical):    Physical Activity:    ??? Days of Exercise per Week:    ??? Minutes of Exercise per Session:    Stress:    ??? Feeling of Stress :    Social Connections:    ??? Frequency of Communication with Friends and Family:    ??? Frequency of Social Gatherings with Friends and Family:    ??? Attends Religious Services:    ??? Database administrator or Organizations:    ??? Attends Engineer, structural:    ??? Marital Status:    Catering manager Violence:    ??? Fear of Current or Ex-Partner:    ??? Emotionally Abused:    ??? Physically Abused:    ??? Sexually Abused:    Depression:    ??? PHQ-2 Score:    Housing Stability: Unknown   ??? Within the past 12 months, have you ever stayed: outside, in a car, in a tent, in an overnight shelter, or temporarily in someone else's home (i.e. couch-surfing)?: No   ??? Are you worried about losing your housing?: Not on file   ??? Within the past 12 months, have you been unable to get utilities (heat, electricity) when it was really needed?: Not on file   Substance Use:    ??? Taken prescription drugs for non-medical reasons:    ??? Taken illegal drugs:    ??? Patient indicated they have taken drugs in the past year, including Cannabis, Cocaine, Prescription stimulants, Methamphetamine, Inahalnts, Sedatives or sleeping pills, Hallucinogens, Street Opioids or Prescription opiods for non-medical reasons:    Health Literacy: Low Risk    ??? : Never       Social History  Support Systems/Concerns: Spouse                                          Medical and Psychiatric History  Psychosocial Stressors: Denies      Psychological Issues/Information: No issues              Chemical Dependency: None              Outpatient Providers: Specialist [facility])      Legal: No legal issues      Ability to [employer] Services: No issues accessing community services         CM met with patient in pt room.  Pt/visitors were wearing hospital provided masks for the duration of the interaction with CM.   CM was wearing hospital provided surgical mask.  CM was not within 6 foot of the patient/visitors during this interaction.     Met with patient and spouse at bedside to introduce to CM role and offer support. No psychosocial needs identified at this time. CM communicated with MDB team today and patient is observation status, with no discharge needs identified.

## 2020-03-11 NOTE — Unmapped (Signed)
ISAR Screening- for all patients 65+ community-dwelling patients    1. Before the illness or injury that brought you to the Emergency Department, did you need someone to help you on a regular basis? NO  2. Since the illness or injury that brought you to the Emergency Department, have you needed more help than usual to take care of yourself? NO  3. Have you been hospitalized for one or more nights during the past six months (excluding a stay in the Emergency Department)? NO  4. In general, do you see well? YES  5. In general, do you have serious problems with your memory? NO  6. Do you take more than five different prescribed medications every day? YES    A score of 3 or more will require a Case Management consultation. Primary RN notified of score of 3 or more and CM paged for assessment.

## 2020-03-11 NOTE — Unmapped (Signed)
Physician Discharge Summary [facility]  [facility]  [address]  [place] [address]  Dept: [phone]  Loc: [phone]     Identifying Information:   Frank Leach  08-May-1951  [id number]    Primary Care Physician: Carron Brazen, MD     Code Status: Full Code    Admit Date: 03/10/2020    Discharge Date: 03/11/2020     Discharge To: Home    Discharge Service: [facility] - Nephrology Floor Team (MEDB)     Discharge Attending Physician: Lenore Manner Denu-Ciocca, MD    Discharge Diagnoses:     Active Problems:    ESRD (end stage renal disease) (CMS-HCC) POA: Yes    Hyperlipidemia POA: Yes    Diabetes mellitus (CMS-HCC) POA: Yes    HTN (hypertension) POA: Yes    Kidney replaced by transplant POA: Not Applicable    Immunosuppression (CMS-HCC) POA: Yes  Resolved Problems:    * No resolved hospital problems. *      Hospital Course:   Frank Leach is a 69 y.o. male with PMHx of ESRD 2/2 to HTN previously requiring iHD s/p transplant 06/22/19 that presented to [facility] with persistent hypoglycemia after starting glipizide on 8/2.    Hypoglycemia  Patient has a history of T2DM on metformin 1000mg  BID and previously semaglutide. His semaglutide was discontinue on 8/2 and was subsequently started on glipizide. Patient noted decreased blood sugar levels and spoke to transplant nephrology team. They had advised to discontinue glipizide due to hypoglycemia. He took another dose of the glipizide on Wednesday by mistake. He woke up at 4AM (8/5) very disoriented. His blood glucose was 36. EMS subsequently called. His blood glucose increased to 100 s/p sugar water. Rechecks were consistently less than 50 and were minimally responsive to sugar water. He was then taken to the [facility] ED. On presentation had a blood glucose of 40. He had small improvement with D50 and was put on a D5 infusion and transferred to [facility]. He received one dose of octreotide and his blood glucose increased to greater than 100. He was placed on q2hr glucose checks. His blood glucose at 3AM was 178. Repeat blood glucose prior to discharge was 135. His metformin and glipizide were discontinued in the setting of hypoglycemia. His Transplant Nephrology team was informed about your admission and treatment. They will reach out to you for a follow-up appointment.     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 Baseline creatinine of 1.5. Creatinine was 1.5 at presentation to the ED. Compliant with transplant medications. Continued on Myfortic 180 mg BID, Tacrolimus XR 8 mg daily. Repeat Tacrolimus Trough: 5.2. Continued CMV prophylaxis with Valgangciclovir 450 mg BID.     Chronic Problems:  HTN: Amlodipine 10mg  daily, Coreg 12.5mg  BID  HLD: Atorvastatin 40mg , ASA 81mg   Gout: Allopurinol 100mg  daily  LLE swelling: PVLs done in July were negative for DVT       Outpatient Provider Follow Up Issues:   None    Touchbase with Outpatient Provider:  Warm Handoff: Completed on 03/10/20   by Ledora Bottcher  (Intern) via Epic Secure Chat    Procedures:  None  ______________________________________________________________________  Discharge Medications:     Your Medication List      STOP taking these medications    metFORMIN 500 MG 24 hr tablet  Commonly known as: GLUCOPHAGE-XR     SPS (WITH SORBITOL) 15-20 gram/60 mL Susp  Generic drug: sodium polystyrene (SPS) with sorbitol  CHANGE how you take these medications    atorvastatin 40 MG tablet  Commonly known as: LIPITOR  Take 1 tablet (40 mg total) by mouth daily.  What changed: when to take this        CONTINUE taking these medications    acetaminophen 500 MG tablet  Commonly known as: TYLENOL  Take 1-2 tablets (500-1,000 mg total) by mouth every six (6) hours as needed for pain or fever (> 38C).     allopurinoL 100 MG tablet  Commonly known as: ZYLOPRIM  Take 1 tablet (100 mg total) by mouth every evening.     amLODIPine 10 MG tablet  Commonly known as: NORVASC  Take 1 tablet (10 mg total) by mouth daily. aspirin 81 MG tablet  Commonly known as: ECOTRIN  Take 1 tablet (81 mg total) by mouth daily.     carvediloL 6.25 MG tablet  Commonly known as: COREG  Take 2 tablets (12.5 mg total) by mouth Two (2) times a day.     cholecalciferol (vitamin D3-125 mcg (5,000 unit)) 125 mcg (5,000 unit) tablet  Take 1 tablet (125 mcg total) by mouth daily.     ENVARSUS XR 4 mg Tb24 extended release tablet  Generic drug: tacrolimus  Take 2 tablets (8 mg total) by mouth daily.     magnesium (amino acid chelate) 133 mg Tab  Generic drug: magnesium oxide-Mg AA chelate  Take 3 tablets (399 mg total) by mouth two (2) times a day.     mycophenolate 180 MG EC tablet  Commonly known as: MYFORTIC  Take 1 tablet (180 mg total) by mouth Two (2) times a day.     omeprazole 40 MG capsule  Commonly known as: PriLOSEC  Take 1 capsule (40 mg total) by mouth every other day.     valGANciclovir 450 mg tablet  Commonly known as: VALCYTE  Take 1 tablet (450 mg total) by mouth Two (2) times a day.            Allergies:  Patient has no known allergies.  ______________________________________________________________________  Pending Test Results:      Most Recent Labs:  All lab results last 24 hours -   Recent Results (from the past 24 hour(s))   POCT Glucose    Collection Time: 03/10/20  7:23 PM   Result Value Ref Range    Glucose, POC 54 (L) 70 - 179 mg/dL   POCT Glucose    Collection Time: 03/10/20  7:49 PM   Result Value Ref Range    Glucose, POC 47 (L) 70 - 179 mg/dL   Comprehensive Metabolic Panel    Collection Time: 03/10/20  8:01 PM   Result Value Ref Range    Sodium 138 135 - 145 mmol/L    Potassium 4.9 (H) 3.4 - 4.5 mmol/L    Chloride 111 (H) 98 - 107 mmol/L    Anion Gap 5 5 - 14 mmol/L    CO2 22.2 20.0 - 31.0 mmol/L    BUN 19 9 - 23 mg/dL    Creatinine 1.61 (H) 0.60 - 1.10 mg/dL    BUN/Creatinine Ratio 13     EGFR CKD-EPI Non-African American, Male 47 (L) >=60 mL/min/1.39m2    EGFR CKD-EPI African American, Male 55 (L) >=60 mL/min/1.58m2 Glucose 44 (L) 70 - 179 mg/dL    Calcium 09.6 (H) 8.7 - 10.4 mg/dL    Albumin 4.2 3.4 - 5.0 g/dL    Total Protein 8.5 (H) 5.7 - 8.2 g/dL  Total Bilirubin 0.3 0.3 - 1.2 mg/dL    AST 24 <=09 U/L    ALT 16 10 - 49 U/L    Alkaline Phosphatase 84 46 - 116 U/L   C-peptide    Collection Time: 03/10/20  8:01 PM   Result Value Ref Range    C-Peptide 9.00 (H) 0.48 - 5.05 ng/mL   Insulin Level, Total    Collection Time: 03/10/20  8:01 PM   Result Value Ref Range    Insulin 43.1 (H) 2.6 - 37.6 mU/L   CBC w/ Differential    Collection Time: 03/10/20  8:02 PM   Result Value Ref Range    Results Verified by Slide Scan Slide Reviewed     WBC 3.2 (L) 3.5 - 10.5 10*9/L    RBC 3.98 (L) 4.32 - 5.72 10*12/L    HGB 12.1 (L) 13.5 - 17.5 g/dL    HCT 81.1 (L) 91.4 - 50.0 %    MCV 94.0 81.0 - 95.0 fL    MCH 30.4 26.0 - 34.0 pg    MCHC 32.4 30.0 - 36.0 g/dL    RDW 78.2 (H) 95.6 - 15.0 %    MPV 7.2 7.0 - 10.0 fL    Platelet 282 150 - 450 10*9/L    nRBC 0 <=4 /100 WBCs    Neutrophils % 87.8 %    Lymphocytes % 6.8 %    Monocytes % 1.5 %    Eosinophils % 1.6 %    Basophils % 2.3 %    Absolute Neutrophils 2.8 1.7 - 7.7 10*9/L    Absolute Lymphocytes 0.2 (L) 0.7 - 4.0 10*9/L    Absolute Monocytes 0.0 (L) 0.1 - 1.0 10*9/L    Absolute Eosinophils 0.1 0.0 - 0.7 10*9/L    Absolute Basophils 0.1 0.0 - 0.1 10*9/L    Anisocytosis Marked (A) Not Present   Blood Gas Critical Care Panel, Venous    Collection Time: 03/10/20  8:02 PM   Result Value Ref Range    Specimen Source Venous     FIO2 Venous Room Air     pH, Venous 7.31 (L) 7.32 - 7.43    pCO2, Ven 45 40 - 60 mm Hg    pO2, Ven 37 30 - 55 mm Hg    HCO3, Ven 21 (L) 22 - 27 mmol/L    Base Excess, Ven -3.8 (L) -2.0 - 2.0    O2 Saturation, Venous 66.0 40.0 - 85.0 %    Sodium Whole Blood 140 135 - 145 mmol/L    Potassium, Bld 5.0 (H) 3.4 - 4.6 mmol/L    Calcium, Ionized Venous 5.49 (H) 4.40 - 5.40 mg/dL    Glucose Whole Blood 44 Undefined mg/dL    Lactate, Venous 1.2 0.5 - 1.8 mmol/L    Hgb, blood gas 12.40 (L) 13.50 - 17.50 g/dL   Tacrolimus Level, Trough    Collection Time: 03/10/20  8:02 PM   Result Value Ref Range    Tacrolimus, Trough 5.2 5.0 - 15.0 ng/mL   POCT Glucose    Collection Time: 03/10/20  9:08 PM   Result Value Ref Range    Glucose, POC 55 (L) 70 - 179 mg/dL   POCT Glucose    Collection Time: 03/10/20 10:05 PM   Result Value Ref Range    Glucose, POC 46 (L) 70 - 179 mg/dL   POCT Glucose    Collection Time: 03/10/20 11:22 PM   Result Value Ref Range    Glucose, POC  75 70 - 179 mg/dL   POCT Glucose    Collection Time: 03/11/20 12:13 AM   Result Value Ref Range    Glucose, POC 84 70 - 179 mg/dL   POCT Glucose    Collection Time: 03/11/20  1:12 AM   Result Value Ref Range    Glucose, POC 105 70 - 179 mg/dL   POCT Glucose    Collection Time: 03/11/20  2:06 AM   Result Value Ref Range    Glucose, POC 126 70 - 179 mg/dL   POCT Glucose    Collection Time: 03/11/20  3:13 AM   Result Value Ref Range    Glucose, POC 178 70 - 179 mg/dL   POCT Glucose    Collection Time: 03/11/20  4:47 AM   Result Value Ref Range    Glucose, POC 161 70 - 179 mg/dL   VWUJW-11 PCR    Collection Time: 03/11/20  5:12 AM    Specimen: Nasopharyngeal Swab   Result Value Ref Range    SARS-CoV-2 PCR Negative Negative   Cortisol    Collection Time: 03/11/20  6:46 AM   Result Value Ref Range    Cortisol 7.9 See Comment ug/dL   CBC    Collection Time: 03/11/20  6:46 AM   Result Value Ref Range    WBC 3.0 (L) 4.5 - 11.0 10*9/L    RBC 3.67 (L) 4.50 - 5.90 10*12/L    HGB 11.2 (L) 13.5 - 17.5 g/dL    HCT 91.4 (L) 78.2 - 53.0 %    MCV 100.3 (H) 80.0 - 100.0 fL    MCH 30.4 26.0 - 34.0 pg    MCHC 30.3 (L) 31.0 - 37.0 g/dL    RDW 95.6 (H) 21.3 - 15.0 %    MPV 9.0 7.0 - 10.0 fL    Platelet 291 150 - 440 10*9/L   Basic Metabolic Panel    Collection Time: 03/11/20  6:46 AM   Result Value Ref Range    Sodium 133 (L) 135 - 145 mmol/L    Potassium 5.7 (H) 3.4 - 4.5 mmol/L    Chloride 109 (H) 98 - 107 mmol/L    CO2 21.0 20.0 - 31.0 mmol/L    Anion Gap 3 (L) 5 - 14 mmol/L    BUN 15 9 - 23 mg/dL    Creatinine 0.86 (H) 0.60 - 1.10 mg/dL    BUN/Creatinine Ratio 10     EGFR CKD-EPI Non-African American, Male 49 (L) >=60 mL/min/1.68m2    EGFR CKD-EPI African American, Male 47 (L) >=60 mL/min/1.94m2    Glucose 156 70 - 179 mg/dL    Calcium 9.6 8.7 - 57.8 mg/dL   POCT Glucose    Collection Time: 03/11/20  7:29 AM   Result Value Ref Range    Glucose, POC 145 70 - 179 mg/dL   POCT Glucose    Collection Time: 03/11/20 11:20 AM   Result Value Ref Range    Glucose, POC 135 70 - 179 mg/dL       Relevant Studies/Radiology:  No results found.  ______________________________________________________________________  Discharge Instructions:   Activity Instructions     Activity as tolerated      Activity as tolerated          Follow-up Information    --We contacted your renal transplant team about your hospital stay and treatment. They will be in touch with you about scheduling a follow-up appointment.     --Vascular Access Appointment with [facility]  Transplant Surgery [place] on 04/01/2020 at 1:00PM.    --Nephrology Appointment with [facility] Kidney Transplant [facility] with Dr. Nestor Lewandowsky on 04/07/2020 at 9:30AM     --Pharmacy Appointment with [facility] Kidney Transplant [facility] with Cleone Slim, CPP on 04/07/2020 at 10:00AM    --CT Imaging Appointment with Imaging CT [facility] on 04/07/2020 at 11:00AM.       Follow Up instructions and Outpatient Referrals     Call MD for:  difficulty breathing, headache or visual disturbances      Call MD for:  difficulty breathing, headache or visual disturbances      Call MD for:  persistent nausea or vomiting      Call MD for:  persistent nausea or vomiting      Call MD for:  severe uncontrolled pain      Call MD for:  severe uncontrolled pain      Call MD for:  temperature >38.5 Celsius      Call MD for:  temperature >38.5 Celsius      Discharge instructions      You were admitted to [facility] with low blood sugars.  This was due to the new medication glipizide which has been stopped.  You were given IV glucose which has stabilized her blood sugar.  Is important to not take that medication anymore.    You are now ready to discharge and will be discharging to: Home    Please take your medications as prescribed. Medication changes are listed below and a full list of medications is in this discharge packet. Please keep your follow-up appointments after the hospital for ongoing care. It has been a pleasure taking care of you, we wish you the best.     MEDICATION CHANGES:  --STOP taking glipizide-Stop taking Metformin at this time.  You may be restarted following discussion with your kidney team.  Check your blood sugars at home, and if they are consistently above 200 you can restart the Metformin.    FOLLOW-UP:  -- It is important to see your primary care provider (PCP) after being in the hospital. Your PCP is Carron Brazen, MD and the phone number of their office is [phone]. We have sent a message to get you follow-up with her in the next couple weeks.  --We have also sent a message to your transplant team about follow-up next week. If you do not hear by the beginning of next week, call your transplant coordinator.    Discharge instructions      You were admitted to [facility] with hypoglycemia (low blood sugar). We believe you had low blood sugar because of your diabetes medication called glipizide. We gave you treatment to increase your blood sugar levels until they were stable enough for you to go home.     You are now ready to discharge and will be discharging to: Home    Please take your medications as prescribed. Medication changes are listed below and a full list of medications is in this discharge packet. Please keep your follow-up appointments after the hospital for ongoing care. It has been a pleasure taking care of you, we wish you the best.     MEDICATION CHANGES:  -- Please stop taking Metformin, but you can resume your home dose of 1000mg  twice daily if your sugar checks at home are getting high.   -- Please stop taking Glipizide    FOLLOW-UP:  -- It is important to see your  primary care provider (PCP) after being in the hospital. Your PCP is Carron Brazen, MD and the phone number of their office is [phone]. Please call your PCP's office as soon as possible to schedule an appointment with them.   -- Your Transplant Kidney Team will be in touch to schedule an appointment for you to see them.    --Vascular Access Appointment with [facility] Transplant [facility] on 04/01/2020 at 1:00PM.  ??  --Nephrology Appointment with [facility] Kidney Transplant [facility] with Dr. Nestor Lewandowsky on 04/07/2020 at 9:30AM   ??  --Pharmacy Appointment with [facility] Kidney Transplant [facility] with Cleone Slim, CPP on 04/07/2020 at 10:00AM  ??  --CT Imaging Appointment with Imaging CT [facility] on 04/07/2020 at 11:00AM.          Appointments which have been scheduled for you    Apr 01, 2020  1:00 PM  (Arrive by 12:30 PM)  VASCULAR ACCESS with Leona Carry, MD  [facility] TRANSPLANT SURGERY [place] [facility] REGION) [address]  [place] [address]  [phone]      Apr 07, 2020  9:30 AM  (Arrive by 9:15 AM)  RETURN NEPHROLOGY POST with Leeroy Bock, MD  [facility] KIDNEY TRANSPLANT [place] [facility] REGION) [address]  [place] [address]  [phone]      Apr 07, 2020 10:00 AM  (Arrive by 9:45 AM)  RETURN PHARMD with Wallace Cullens Mincemoyer, CPP  [facility] KIDNEY TRANSPLANT [place] [facility] REGION) [address]  [place] [address]  [phone]      Apr 07, 2020 11:00 AM  (Arrive by 10:45 AM)  CT ABDOMEN PELVIS WO CONTRAST with [place] RM 1  IMG CT [place]  [facility] [place]) [address]  [place] [address]  [phone]   Let us know if pt:  Pregnant or nursing  Claustrophobic    (Title:CTWOCNTRST) ______________________________________________________________________  Discharge Day Services:  BP 140/67  - Pulse 57  - Temp 36.7 ??C (Oral)  - Resp 27  - Ht 170.2 cm (5' 7)  - Wt 70.3 kg (155 lb)  - SpO2 94%  - BMI 24.28 kg/m??     Pt seen on the day of discharge and determined appropriate for discharge.    Condition at Discharge: good    Length of Discharge: I spent less than 30 mins in the discharge of this patient.

## 2020-03-11 NOTE — Unmapped (Signed)
Nephrology (MEDB) History & Physical    Assessment & Plan:   Frank Leach is a 69 y.o. male with PMHx of ESRD 2/2 to HTN previously requiring iHD s/p transplant 06/22/19 that presented to [facility] with persistent hypoglycemia after starting glipizide on 8/2.    Active Problems:    * No active hospital problems. *  Resolved Problems:    * No resolved hospital problems. *      Hypoglycemia, DM: Patient with hx of T2DM, on metformin 1000mg  BID and previously semaglutide. Semaglutide discontinued and glipizide started 8/2. Does not use insulin. Does not regularly check blood sugars, only ~every 3 days. Patient had decreased appetite over the past few days and early 8/4 AM his wife noted that he was disoriented and blood sugar was 36. Improved with sugar water and juice but continued to drop throughout the day prompting presentation to ED. Minimal improvement with D50 PRN in ED, unfortunately no D10 available in [facility] so D5 infusion was started with sugars consistently ~40-50's. Octreotide subQ given x1 and D5 continued at 121mL/hr until BS improved to 80-100 at ~0000. C-peptide and serum insulin level elevated consistent with sulfonylurea endogenous insulin stimulation. Dextrose fluids now discontinued.  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 Cr in ED 1.5. Compliant with medications, recently restarted on myfortic after having issues with diarrhea.   -Myfortic 180mg  BID  -Tacrolimus XR 8mg  daily  -Valganciclovir 450mg  BID  -Tacro level    Chronic Problems:  HTN: Amlodipine 10mg  daily, coreg 12.5mg  BID  HLD: Atorvastatin 40mg , ASA 81mg   Gout: Allopurinol 100mg  daily  LLE swelling: PVL's completed in July negative for acute clot, chronic venous changes    Daily Checklist:  Diet: Regular Diet DVT PPx: Heparin 5000units q8h  Electrolytes: No Repletion Needed  Code Status: Full Code    Chief Concern:   No Principal Problem: There is no principal problem currently on the Problem List. Please update the Problem List and refresh.    Subjective:   HPI:  Frank Leach is a 69 y.o. male with PMHx of ESRD 2/2 to HTN previously requiring iHD s/p transplant 06/22/19 that presented to [facility] with persistent hypoglycemia after starting glipizide a few days prior.    Previously had been managing T2DM with metformin and ozempic. Ozempic discontinued and glipizide started on 8/2. Patient and wife state he had decreased appetite over the past few days and 8/4 around 0400 awoke and was disoriented. His wife called EMS and on their arrival BG was 36. Improved to >100 with sugar water. Wife continued to check BG throughout day and multiple times BG <50 and would improve only slightly with juice/sugar water/food. This prompted presentation to [facility] ED where BG noted to be in 40's with minimal improvement with D50 and continuous D5 infusion (unfortunately D10 not available in [facility] ED). Ultimately received 1x octreotide as off label use of treatment for sulfonylurea toxicity. BG subsequently improved to >100. Patient with normal mental status and asymptomatic throughout ED stay.    Patient denies HA, vision changes, chest pain, SOB, nausea/vomiting, changes in bowel or bladder habits.     Designated Environmental health practitioner:  [name] currently has decisional capacity for healthcare decision-making and is able to designate a surrogate healthcare decision maker. Mr.  Muckey's designated healthcare decision maker(s) is/are Celene Skeen (the patient's spouse) as denoted by stated patient preference.    Allergies:  Patient has no known allergies.    Medications:   Prior to Admission medications    Medication Dose, Route, Frequency   acetaminophen (TYLENOL) 500 MG tablet 500-1,000 mg, Oral, Every 6 hours PRN  Patient not taking: Reported on 10/27/2019   allopurinoL (ZYLOPRIM) 100 MG tablet 100 mg, Oral, Every evening   amLODIPine (NORVASC) 10 MG tablet 10 mg, Oral, Daily (standard)   aspirin (ECOTRIN) 81 MG tablet 81 mg, Oral, Daily (standard)   atorvastatin (LIPITOR) 40 MG tablet 40 mg, Oral, Daily (standard)   carvediloL (COREG) 6.25 MG tablet 12.5 mg, Oral, 2 times a day (standard)   cholecalciferol, vitamin D3-125 mcg, 5,000 unit,, 125 mcg (5,000 unit) tablet 125 mcg, Oral, Daily (standard)   ENVARSUS XR 4 mg Tb24 extended release tablet 8 mg, Oral, Daily   magnesium oxide-Mg AA chelate (MAGNESIUM, AMINO ACID CHELATE,) 133 mg Tab Take 3 tablets (399 mg total) by mouth two (2) times a day.   metFORMIN (GLUCOPHAGE-XR) 500 MG 24 hr tablet 1,000 mg, Oral, Daily (RT)   mycophenolate (MYFORTIC) 180 MG EC tablet 180 mg, Oral, 2 times a day (standard)   omeprazole (PRILOSEC) 40 MG capsule 40 mg, Oral, Every other day   papaverine 16.7 mg/mL phentolamine 0.56 mg/mL alprostadil 5.6 mcg/mL injection Inject as directed by physician, starting with 0.1 mL. May increase by 0.1 ml with next dose if not effective.   valGANciclovir (VALCYTE) 450 mg tablet 450 mg, Oral, 2 times a day (standard)       Medical History:  Past Medical History:   Diagnosis Date   ??? Anemia    ??? Cancer (CMS-HCC)     clear cell adenocarcinoma of left kidney   ??? Diabetes mellitus (CMS-HCC)    ??? Diabetic nephropathy (CMS-HCC)    ??? ESRD (end stage renal disease) on dialysis (CMS-HCC)    ??? ESRD on dialysis (CMS-HCC)    ??? Gout    ??? Hyperlipidemia    ??? Hypertension        Surgical History:  Past Surgical History:   Procedure Laterality Date   ??? AV FISTULA PLACEMENT     ??? PR COLONOSCOPY FLX DX W/COLLJ SPEC WHEN PFRMD N/A 04/30/2018    Procedure: COLONOSCOPY, FLEXIBLE, PROXIMAL TO SPLENIC FLEXURE; DIAGNOSTIC, W/WO COLLECTION SPECIMEN BY BRUSH OR WASH;  Surgeon: Luanne Bras, MD;  Location: [facility] PROCED[facility];  Service: Gastroenterology   ??? PR COLONOSCOPY W/BIOPSY SINGLE/MULTIPLE N/A 04/30/2018    Procedure: COLONOSCOPY, FLEXIBLE, PROXIMAL TO SPLENIC FLEXURE; WITH BIOPSY, SINGLE OR MULTIPLE;  Surgeon: Luanne Bras, MD;  Location: [facility] PROCEDURES [facility];  Service: Gastroenterology   ??? PR COLSC FLX W/RMVL OF TUMOR POLYP LESION SNARE TQ N/A 04/30/2018    Procedure: COLONOSCOPY FLEX; W/REMOV TUMOR/LES BY SNARE;  Surgeon: Luanne Bras, MD;  Location: [facility] PROCEDURES [facility];  Service: Gastroenterology   ??? PR EXPLORATORY OF ABDOMEN Midline 04/14/2016    Procedure: EXPLORATORY LAPAROTOMY, EXPLORATORY CELIOTOMY WITH OR WITHOUT BIOPSY(S);  Surgeon: Leona Carry, MD;  Location: MAIN OR [facility];  Service: Transplant   ??? PR EXPLORATORY OF ABDOMEN N/A 04/16/2016    Procedure: EXPLORATORY LAPAROTOMY, EXPLORATORY CELIOTOMY WITH OR WITHOUT BIOPSY(S);  Surgeon: Leona Carry, MD;  Location: MAIN OR [facility];  Service: Transplant   ??? PR LAP, RADICAL NEPHRECTOMY Right 12/27/2016    Procedure: Robotic Xi Laparoscopy;  Radical Nephrectomy (Incl Remove Gerota`S Fascia, Fatty Tissue, Reg Lymph Node, Adrenalectomy);  Surgeon: Tomie China, MD;  Location: MAIN OR [facility];  Service: Urology   ??? PR NEGATIVE PRESSURE WOUND THERAPY DME >50 SQ CM N/A 04/12/2016    Procedure: NEG PRESS WOUND TX (VAC ASSIST) INCL TOPICALS, PER SESSION, TSA GREATER THAN/= 50 CM SQUARED;  Surgeon: Leona Carry, MD;  Location: MAIN OR [facility];  Service: Transplant   ??? PR NEGATIVE PRESSURE WOUND THERAPY DME >50 SQ CM Bilateral 04/14/2016    Procedure: NEG PRESS WOUND TX (VAC ASSIST) INCL TOPICALS, PER SESSION, TSA GREATER THAN/= 50 CM SQUARED;  Surgeon: Leona Carry, MD;  Location: MAIN OR [facility];  Service: Transplant   ??? PR NEGATIVE PRESSURE WOUND THERAPY DME >50 SQ CM N/A 04/16/2016    Procedure: NEG PRESS WOUND TX (VAC ASSIST) INCL TOPICALS, PER SESSION, TSA GREATER THAN/= 50 CM SQUARED;  Surgeon: Leona Carry, MD;  Location: MAIN OR [facility];  Service: Transplant ??? PR NEPHRECTOMY, W/PART. URETECTOMY Bilateral 04/11/2016    Procedure: LAPAROSCOPY, SURGICAL, NEPHRECTOMY WITH TOTAL URETERECTOMY;  Surgeon: Leona Carry, MD;  Location: MAIN OR [facility];  Service: Transplant   ??? PR REMV KIDNEY,W/RIB RESECTION Bilateral 04/11/2016    Procedure: NEPHRECTOMY, INCLUDING PARTIAL URETERECTOMY, ANY OPEN APPROACH INCLUDING RIB RESECTION;  Surgeon: Leona Carry, MD;  Location: MAIN OR [facility];  Service: Transplant   ??? PR TRANSPLANT,PREP LIVING  RENAL GRAFT N/A 06/22/2019    Procedure: [facility] STD PREP LIVING DONOR RENAL ALLGRFT (OPEN/LAPROSC) PRIOR TO TRANSPLANT, INC DISSECT/REM AS NECESS;  Surgeon: Leona Carry, MD;  Location: MAIN OR [facility];  Service: Transplant   ??? PR TRANSPLANTATION OF KIDNEY N/A 06/22/2019    Procedure: RENAL ALLOTRANSPLANTATION, IMPLANTATION OF GRAFT; WITHOUT RECIPIENT NEPHRECTOMY;  Surgeon: Leona Carry, MD;  Location: MAIN OR [facility];  Service: Transplant   ??? SPLENECTOMY         Family History:   Family History   Problem Relation Age of Onset   ??? Kidney disease Mother    ??? Cancer Father    ??? Kidney disease Brother        Social History:  The patient lives with family    Social History     Tobacco Use   ??? Smoking status: Never Smoker   ??? Smokeless tobacco: Never Used   Vaping Use   ??? Vaping Use: Never used   Substance Use Topics   ??? Alcohol use: No   ??? Drug use: No        Review of Systems:  10 systems were reviewed and are negative unless otherwise mentioned in the HPI    Objective:   Physical Exam:  Temp:  [37 ??C] 37 ??C  Heart Rate:  [56-75] 56  SpO2 Pulse:  [53-58] 53  Resp:  [18-20] 19  BP: (136-162)/(71-75) 136/71  SpO2:  [96 %-100 %] 96 %    Gen: WDWN  in NAD, answers questions appropriately  Eyes: Sclera anicteric, EOMI, PERRLA,  HENT: atraumatic, normocephalic, MMM. OP w/o erythema or exudate   Neck: no cervical lymphadenopathy or thyromegaly, no JVD  Heart: RRR, S1, S2, no M/R/G, no chest wall tenderness  Lungs: CTAB, no crackles or wheezes, no use of accessory muscles  Abdomen: Normoactive bowel sounds, soft, NTND, no rebound/guarding, no hepatosplenomegaly  Extremities: no clubbing, cyanosis, or edema: pulses are +2 in bilateral upper and lower extremities  Neuro: CN II-XI grossly intact, normal cerebellar function, normal gait. No focal deficits.  Skin:  No rashes, lesions on clothed exam  Psych: Alert, oriented, normal mood and affect.     Labs/Studies/Imaging:  Labs, Studies, Imaging from the last 24hrs per EMR and personally reviewed

## 2020-03-11 NOTE — Unmapped (Signed)
Pt BG was 36 this morning and 40 this evening. S/o gave pt orange juice PTA. Pt has a dose of Glipizde today. Kidney transplant 9 months ago. Pt stating he felt like his vision was blurry earlier.

## 2020-03-11 NOTE — Unmapped (Signed)
Pt a/o x4.  No c/o pain this shift.  Up ad lib in room, no difficulty noted.  Blood glucose 140s-160s since arrival to this unit, frequency decreased to q2hrs. VSS.  Pts wife at bedside will cont to monitor.    Problem: Adult Inpatient Plan of Care  Goal: Plan of Care Review  Outcome: Progressing  Goal: Patient-Specific Goal (Individualization)  Outcome: Progressing  Goal: Absence of Hospital-Acquired Illness or Injury  Outcome: Progressing  Goal: Optimal Comfort and Wellbeing  Outcome: Progressing  Goal: Readiness for Transition of Care  Outcome: Progressing  Goal: Rounds/Family Conference  Outcome: Progressing     Problem: Wound  Goal: Optimal Wound Healing  Outcome: Progressing     Problem: Glycemic Control Impaired  Goal: Blood Glucose Level Within Desired Range  Outcome: Progressing

## 2020-03-15 ENCOUNTER — Ambulatory Visit: Admit: 2020-03-15 | Discharge: 2020-03-16 | Payer: MEDICARE

## 2020-03-15 LAB — RENAL FUNCTION PANEL
ALBUMIN: 4 g/dL (ref 3.4–5.0)
ANION GAP: 2 mmol/L — ABNORMAL LOW (ref 5–14)
BLOOD UREA NITROGEN: 22 mg/dL (ref 9–23)
BUN / CREAT RATIO: 15
CHLORIDE: 114 mmol/L — ABNORMAL HIGH (ref 98–107)
CO2: 22.7 mmol/L (ref 20.0–31.0)
CREATININE: 1.49 mg/dL — ABNORMAL HIGH
EGFR CKD-EPI AA MALE: 55 mL/min/{1.73_m2} — ABNORMAL LOW (ref >=60)
GLUCOSE RANDOM: 114 mg/dL (ref 70–179)
PHOSPHORUS: 3.7 mg/dL (ref 2.4–5.1)
POTASSIUM: 5.5 mmol/L — ABNORMAL HIGH (ref 3.4–4.5)
SODIUM: 139 mmol/L (ref 135–145)

## 2020-03-15 LAB — CBC W/ AUTO DIFF
BASOPHILS ABSOLUTE COUNT: 0 10*9/L (ref 0.0–0.1)
BASOPHILS RELATIVE PERCENT: 0 %
EOSINOPHILS ABSOLUTE COUNT: 0.1 10*9/L (ref 0.0–0.7)
EOSINOPHILS RELATIVE PERCENT: 1.8 %
HEMATOCRIT: 33.9 % — ABNORMAL LOW (ref 38.0–50.0)
LYMPHOCYTES ABSOLUTE COUNT: 0.3 10*9/L — ABNORMAL LOW (ref 0.7–4.0)
LYMPHOCYTES RELATIVE PERCENT: 11.7 %
MEAN CORPUSCULAR HEMOGLOBIN CONC: 32.6 g/dL (ref 30.0–36.0)
MEAN CORPUSCULAR HEMOGLOBIN: 30.6 pg (ref 26.0–34.0)
MEAN CORPUSCULAR VOLUME: 93.7 fL (ref 81.0–95.0)
MEAN PLATELET VOLUME: 7.5 fL (ref 7.0–10.0)
MONOCYTES ABSOLUTE COUNT: 0 10*9/L — ABNORMAL LOW (ref 0.1–1.0)
MONOCYTES RELATIVE PERCENT: 1.5 %
NEUTROPHILS ABSOLUTE COUNT: 2.5 10*9/L (ref 1.7–7.7)
NEUTROPHILS RELATIVE PERCENT: 85 %
NUCLEATED RED BLOOD CELLS: 0 /100{WBCs} (ref <=4)
PLATELET COUNT: 296 10*9/L (ref 150–450)
RED BLOOD CELL COUNT: 3.62 10*12/L — ABNORMAL LOW (ref 4.32–5.72)
RED CELL DISTRIBUTION WIDTH: 21.7 % — ABNORMAL HIGH (ref 12.0–15.0)
WBC ADJUSTED: 2.9 10*9/L — ABNORMAL LOW (ref 3.5–10.5)

## 2020-03-15 LAB — POTASSIUM: Potassium:SCnc:Pt:Ser/Plas:Qn:: 5.5 — ABNORMAL HIGH

## 2020-03-15 LAB — MAGNESIUM: Magnesium:MCnc:Pt:Ser/Plas:Qn:: 1.9

## 2020-03-15 LAB — SMEAR REVIEW

## 2020-03-15 LAB — MONOCYTES ABSOLUTE COUNT: Monocytes:NCnc:Pt:Bld:Qn:Automated count: 0 — ABNORMAL LOW

## 2020-03-15 LAB — TACROLIMUS BLOOD: Lab: 5

## 2020-03-15 MED FILL — VALGANCICLOVIR 450 MG TABLET: 30 days supply | Qty: 60 | Fill #2 | Status: AC

## 2020-03-15 MED FILL — ENVARSUS XR 4 MG TABLET,EXTENDED RELEASE: ORAL | 30 days supply | Qty: 60 | Fill #1

## 2020-03-15 MED FILL — ENVARSUS XR 4 MG TABLET,EXTENDED RELEASE: 30 days supply | Qty: 60 | Fill #1 | Status: AC

## 2020-03-15 MED FILL — MYCOPHENOLATE SODIUM 180 MG TABLET,DELAYED RELEASE: 30 days supply | Qty: 60 | Fill #0 | Status: AC

## 2020-03-15 MED FILL — VALGANCICLOVIR 450 MG TABLET: ORAL | 30 days supply | Qty: 60 | Fill #2

## 2020-03-16 LAB — CMV DNA, QUANTITATIVE, PCR: CMV QUANT: 107 [IU]/mL — ABNORMAL HIGH (ref <0)

## 2020-03-16 LAB — CMV COMMENT: Lab: 0

## 2020-03-22 ENCOUNTER — Encounter: Admit: 2020-03-22 | Discharge: 2020-03-23 | Payer: MEDICARE

## 2020-03-22 LAB — CBC W/ AUTO DIFF
BASOPHILS ABSOLUTE COUNT: 0 10*9/L (ref 0.0–0.1)
BASOPHILS RELATIVE PERCENT: 0 %
EOSINOPHILS ABSOLUTE COUNT: 0.1 10*9/L (ref 0.0–0.7)
EOSINOPHILS RELATIVE PERCENT: 2.3 %
HEMATOCRIT: 33.9 % — ABNORMAL LOW (ref 38.0–50.0)
HEMOGLOBIN: 11.2 g/dL — ABNORMAL LOW (ref 13.5–17.5)
LYMPHOCYTES ABSOLUTE COUNT: 0.4 10*9/L — ABNORMAL LOW (ref 0.7–4.0)
LYMPHOCYTES RELATIVE PERCENT: 14.5 %
MEAN CORPUSCULAR HEMOGLOBIN CONC: 32.9 g/dL (ref 30.0–36.0)
MEAN CORPUSCULAR HEMOGLOBIN: 31 pg (ref 26.0–34.0)
MEAN CORPUSCULAR VOLUME: 94.3 fL (ref 81.0–95.0)
MEAN PLATELET VOLUME: 7.2 fL (ref 7.0–10.0)
MONOCYTES ABSOLUTE COUNT: 0.1 10*9/L (ref 0.1–1.0)
MONOCYTES RELATIVE PERCENT: 2.1 %
NEUTROPHILS RELATIVE PERCENT: 81.1 %
NUCLEATED RED BLOOD CELLS: 0 /100{WBCs} (ref <=4)
PLATELET COUNT: 214 10*9/L (ref 150–450)
RED BLOOD CELL COUNT: 3.6 10*12/L — ABNORMAL LOW (ref 4.32–5.72)

## 2020-03-22 LAB — CMV DNA, QUANTITATIVE, PCR: CMV QUANT LOG10: 2.03 {Log_IU}/mL — ABNORMAL HIGH (ref <0.00)

## 2020-03-22 LAB — RENAL FUNCTION PANEL
ANION GAP: 2 mmol/L — ABNORMAL LOW (ref 5–14)
BLOOD UREA NITROGEN: 22 mg/dL (ref 9–23)
BUN / CREAT RATIO: 15
CALCIUM: 10.5 mg/dL — ABNORMAL HIGH (ref 8.7–10.4)
CHLORIDE: 114 mmol/L — ABNORMAL HIGH (ref 98–107)
CO2: 23.9 mmol/L (ref 20.0–31.0)
CREATININE: 1.5 mg/dL — ABNORMAL HIGH
EGFR CKD-EPI AA MALE: 55 mL/min/{1.73_m2} — ABNORMAL LOW (ref >=60)
EGFR CKD-EPI NON-AA MALE: 47 mL/min/{1.73_m2} — ABNORMAL LOW (ref >=60)
GLUCOSE RANDOM: 111 mg/dL (ref 70–179)
PHOSPHORUS: 4 mg/dL (ref 2.4–5.1)
POTASSIUM: 5.4 mmol/L — ABNORMAL HIGH (ref 3.4–4.5)
SODIUM: 140 mmol/L (ref 135–145)

## 2020-03-22 LAB — TACROLIMUS BLOOD: Lab: 4.6

## 2020-03-22 LAB — MAGNESIUM: Magnesium:MCnc:Pt:Ser/Plas:Qn:: 2

## 2020-03-22 LAB — CMV VIRAL LD: Lab: 0

## 2020-03-22 LAB — BLOOD UREA NITROGEN: Urea nitrogen:MCnc:Pt:Ser/Plas:Qn:: 22

## 2020-03-22 LAB — BASOPHILS RELATIVE PERCENT: Basophils/100 leukocytes:NFr:Pt:Bld:Qn:Automated count: 0

## 2020-03-24 NOTE — Unmapped (Signed)
Reviewed recent labs with Dr.Jawa, MD and Dr. 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

## 2020-03-26 NOTE — Unmapped (Signed)
Called patient left VM with call back # regarding required documents and not providing them to [facility]

## 2020-03-29 ENCOUNTER — Ambulatory Visit: Admit: 2020-03-29 | Discharge: 2020-03-30 | Payer: MEDICARE

## 2020-03-29 LAB — MAGNESIUM: MAGNESIUM: 2 mg/dL (ref 1.6–2.6)

## 2020-03-29 LAB — CBC W/ AUTO DIFF
BASOPHILS ABSOLUTE COUNT: 0.2 10*9/L — ABNORMAL HIGH (ref 0.0–0.1)
BASOPHILS RELATIVE PERCENT: 9 %
EOSINOPHILS ABSOLUTE COUNT: 0.1 10*9/L (ref 0.0–0.7)
EOSINOPHILS RELATIVE PERCENT: 4.3 %
HEMATOCRIT: 35.2 % — ABNORMAL LOW (ref 38.0–50.0)
HEMOGLOBIN: 11.6 g/dL — ABNORMAL LOW (ref 13.5–17.5)
LYMPHOCYTES ABSOLUTE COUNT: 0.3 10*9/L — ABNORMAL LOW (ref 0.7–4.0)
LYMPHOCYTES RELATIVE PERCENT: 14.2 %
MEAN CORPUSCULAR HEMOGLOBIN CONC: 32.9 g/dL (ref 30.0–36.0)
MEAN CORPUSCULAR HEMOGLOBIN: 31.5 pg (ref 26.0–34.0)
MEAN CORPUSCULAR VOLUME: 95.9 fL — ABNORMAL HIGH (ref 81.0–95.0)
MEAN PLATELET VOLUME: 7.3 fL (ref 7.0–10.0)
MONOCYTES ABSOLUTE COUNT: 0 10*9/L — ABNORMAL LOW (ref 0.1–1.0)
MONOCYTES RELATIVE PERCENT: 2.1 %
NEUTROPHILS ABSOLUTE COUNT: 1.3 10*9/L — ABNORMAL LOW (ref 1.7–7.7)
NEUTROPHILS RELATIVE PERCENT: 70.4 %
NUCLEATED RED BLOOD CELLS: 1 /100{WBCs} (ref <=4)
PLATELET COUNT: 195 10*9/L (ref 150–450)
RED BLOOD CELL COUNT: 3.67 10*12/L — ABNORMAL LOW (ref 4.32–5.72)
RED CELL DISTRIBUTION WIDTH: 21.8 % — ABNORMAL HIGH (ref 12.0–15.0)
WBC ADJUSTED: 1.8 10*9/L — ABNORMAL LOW (ref 3.5–10.5)

## 2020-03-29 LAB — RENAL FUNCTION PANEL
ALBUMIN: 4 g/dL (ref 3.4–5.0)
ANION GAP: 2 mmol/L — ABNORMAL LOW (ref 5–14)
BLOOD UREA NITROGEN: 19 mg/dL (ref 9–23)
BUN / CREAT RATIO: 12
CALCIUM: 10.3 mg/dL (ref 8.7–10.4)
CHLORIDE: 115 mmol/L — ABNORMAL HIGH (ref 98–107)
CO2: 23.5 mmol/L (ref 20.0–31.0)
CREATININE: 1.59 mg/dL — ABNORMAL HIGH
EGFR CKD-EPI AA MALE: 51 mL/min/{1.73_m2} — ABNORMAL LOW (ref >=60)
EGFR CKD-EPI NON-AA MALE: 44 mL/min/{1.73_m2} — ABNORMAL LOW (ref >=60)
GLUCOSE RANDOM: 107 mg/dL (ref 70–179)
PHOSPHORUS: 4.1 mg/dL (ref 2.4–5.1)
POTASSIUM: 5.5 mmol/L — ABNORMAL HIGH (ref 3.4–4.5)
SODIUM: 140 mmol/L (ref 135–145)

## 2020-03-29 LAB — SLIDE REVIEW

## 2020-03-29 LAB — TACROLIMUS LEVEL: TACROLIMUS BLOOD: 6 ng/mL

## 2020-03-29 NOTE — Unmapped (Signed)
Dialysis Access Coordinator Note:  Frank Leach, DOB: 1951-07-06  Contact: phone, does not appear to use mychart  Challenges: -  COVID-19 status: vaccinated    Issue/Concern:  Referral from [facility] to assess vascular access. Kidney txp 06/22/2019.     Current Dialysis Access:   Left lower arm AV fistula    Previous dialysis access history:   unknown    Vessel Mapping/Studies:     NOTES:   03/29/2020  Rescheduling appt for 04/01/20. Called and s/w patient and he describes left lower arm AV fistula with a knot at the end of it He feels it is stable with no recent changes. Told him 04/01/2020 is canceled. He will call his daughter who drives him.   Ordered duplex and in clinic visit same day on or after 04/29/2020 sent.  Pt did not want to take my name or contact information, I have so many numbers.    03/04/2020  Referred by [facility] to assess access.   Request to schedule in clinic visit on or about 04/01/2020 sent.    PMH: Kidney txp 06/22/2019; left kidney cancer; DM; gout; HTN; hyperlipidemia  PSH: kidney txp 2020; splenectomy  Labs on 03/01/2020: sCr 1.6; GFR 44 post txp  Anticoagulant/Antiplatelet medications: aspirin 81 mg daily; Indications: -; This medication status was reviewed on: 03/04/2020 via chart review    Nephrologist: Jawa    Dialysis Center:  n/a  Dialysis Days:   n/a    Pt lives in: [place], [place]    [name], [place]  Dialysis Access Coordinator for Dr. Zara Chess of [facility]  [address] [place], [place] [address]  Desk: [phone]  Fax: [phone]

## 2020-03-30 DIAGNOSIS — Z940 Kidney transplant status: Principal | ICD-10-CM

## 2020-03-30 DIAGNOSIS — Z79899 Other long term (current) drug therapy: Principal | ICD-10-CM

## 2020-03-30 DIAGNOSIS — D849 Immunodeficiency, unspecified: Principal | ICD-10-CM

## 2020-03-30 LAB — CMV DNA, QUANTITATIVE, PCR
CMV QUANT LOG10: 2.16 {Log_IU}/mL — ABNORMAL HIGH (ref <0.00)
CMV QUANT: 144 [IU]/mL — ABNORMAL HIGH (ref <0)

## 2020-03-30 NOTE — Unmapped (Signed)
Dialysis Access Coordinator Note:  Frank Leach, DOB: Dec 16, 1950  Contact: phone, does not appear to use mychart  Challenges: -  COVID-19 status: vaccinated    Issue/Concern:  Referral from [facility] to assess vascular access. Kidney txp 06/22/2019.     Current Dialysis Access:   Left lower arm AV fistula    Previous dialysis access history:   unknown    Vessel Mapping/Studies:     NOTES:   03/29/2020  Rescheduling appt for 04/01/20. Called and s/w patient and he describes left lower arm AV fistula with a knot at the end of it He feels it is stable with no recent changes. Told him 04/01/2020 is canceled. He will call his daughter who drives him.   Ordered duplex and in clinic visit same day on or after 04/29/2020 sent.  Pt did not want to take my name or contact information, I have so many numbers.  Update: duplex order placed 03/30/20 1508  Resent request to reschedule.    03/04/2020  Referred by [facility] to assess access.   Request to schedule in clinic visit on or about 04/01/2020 sent.    PMH: Kidney txp 06/22/2019; left kidney cancer; DM; gout; HTN; hyperlipidemia  PSH: kidney txp 2020; splenectomy  Labs on 03/01/2020: sCr 1.6; GFR 44 post txp  Anticoagulant/Antiplatelet medications: aspirin 81 mg daily; Indications: -; This medication status was reviewed on: 03/04/2020 via chart review    Nephrologist: Jawa    Dialysis Center:  n/a  Dialysis Days:   n/a    Pt lives in: [place], [place]    [name], [place]  Dialysis Access Coordinator for Dr. Zara Chess of [facility] for [facility]plant [facility]  [address], [place], [place] [address]  Desk: [phone]  Fax: [phone]

## 2020-03-31 NOTE — Unmapped (Signed)
8/25: speaking with patient today re: mfg assistance approval for valcyte. Pt states he is aware of this and has numbers to contact mfg to set up first delivery-ef    [facility] Clinical Assessment & Refill Coordination Note    Frank Leach, DOB: 1950/10/20  Phone: [phone] (home)     All above HIPAA information was verified with patient.     Was a Nurse, learning disability used for this call? No    Specialty Medication(s):   Transplant: Envarsus 4mg ,  mycophenolic acid 180mg  and valgancyclovir 450mg      Current Outpatient Medications   Medication Sig Dispense Refill   ??? acetaminophen (TYLENOL) 500 MG tablet Take 1-2 tablets (500-1,000 mg total) by mouth every six (6) hours as needed for pain or fever (> 38C). 100 tablet 0   ??? allopurinoL (ZYLOPRIM) 100 MG tablet Take 1 tablet (100 mg total) by mouth every evening. 30 tablet 11   ??? amLODIPine (NORVASC) 10 MG tablet Take 1 tablet (10 mg total) by mouth daily. 30 tablet 11   ??? aspirin (ECOTRIN) 81 MG tablet Take 1 tablet (81 mg total) by mouth daily. 30 tablet 11   ??? atorvastatin (LIPITOR) 40 MG tablet Take 1 tablet (40 mg total) by mouth daily. (Patient taking differently: Take 40 mg by mouth nightly. ) 90 tablet 3   ??? carvediloL (COREG) 6.25 MG tablet Take 2 tablets (12.5 mg total) by mouth Two (2) times a day. 360 tablet 3   ??? cholecalciferol, vitamin D3-125 mcg, 5,000 unit,, 125 mcg (5,000 unit) tablet Take 1 tablet (125 mcg total) by mouth daily.     ??? ENVARSUS XR 4 mg Tb24 extended release tablet Take 2 tablets (8 mg total) by mouth daily. 60 tablet 11   ??? magnesium oxide-Mg AA chelate (MAGNESIUM, AMINO ACID CHELATE,) 133 mg Tab Take 3 tablets (399 mg total) by mouth two (2) times a day. 180 tablet 11   ??? mycophenolate (MYFORTIC) 180 MG EC tablet Take 1 tablet (180 mg total) by mouth Two (2) times a day. 60 tablet 11   ??? omeprazole (PRILOSEC) 40 MG capsule Take 1 capsule (40 mg total) by mouth every other day. 45 capsule 3   ??? valGANciclovir (VALCYTE) 450 mg tablet Take 1 tablet (450 mg total) by mouth Two (2) times a day. 60 tablet 5     No current facility-administered medications for this visit.        Changes to medications: [name] reports no changes at this time.    No Known Allergies    Changes to allergies: No    SPECIALTY MEDICATION ADHERENCE     envarsus 4mg   : 15 days of medicine on hand   Mycophenolate 180mg   : 15 days of medicine on hand   valganciclovir 450mg   : 15 days of medicine on hand will get brand from mfg going forward  Medication Adherence    Patient reported X missed doses in the last month: 0  Specialty Medication: envarsus 4mg   Patient is on additional specialty medications: Yes  Additional Specialty Medications: Mycophenolate 180mg   Patient Reported Additional Medication X Missed Doses in the Last Month: 0  Patient is on more than two specialty medications: Yes  Specialty Medication: valganciclovir 450mg   Patient Reported Additional Medication X Missed Doses in the Last Month: 0          Specialty medication(s) dose(s) confirmed: Regimen is correct and unchanged.     Are there any concerns with  adherence? No    Adherence counseling provided? Not needed    CLINICAL MANAGEMENT AND INTERVENTION      Clinical Benefit Assessment:    Do you feel the medicine is effective or helping your condition? Yes    Clinical Benefit counseling provided? Not needed    Adverse Effects Assessment:    Are you experiencing any side effects? No    Are you experiencing difficulty administering your medicine? No    Quality of Life Assessment:    How many days over the past month did your transplant  keep you from your normal activities? For example, brushing your teeth or getting up in the morning. 0    Have you discussed this with your provider? Not needed    Therapy Appropriateness:    Is therapy appropriate? Yes, therapy is appropriate and should be continued    DISEASE/MEDICATION-SPECIFIC INFORMATION      N/A    PATIENT SPECIFIC NEEDS     - Does the patient have any physical, cognitive, or cultural barriers? No    - Is the patient high risk? Yes, patient is taking a REMS drug. Medication is dispensed in compliance with REMS program    - Does the patient require a Care Management Plan? No     - Does the patient require physician intervention or other additional services (i.e. nutrition, smoking cessation, social work)? No      SHIPPING     Specialty Medication(s) to be Shipped:   na    Other medication(s) to be shipped: No additional medications requested for fill at this time     Changes to insurance: No    Delivery Scheduled: Patient declined refill at this time due to pt wants call back next week for all meds. he is aware to contact valcyte mfg for that med.     Medication will be delivered via na to the confirmed na address in [place].    The patient will receive a drug information handout for each medication shipped and additional FDA Medication Guides as required.  Verified that patient has previously received a Conservation officer, historic buildings.    All of the patient's questions and concerns have been addressed.    Thad Ranger   [facility] Specialty Pharmacist

## 2020-04-05 ENCOUNTER — Encounter: Admit: 2020-04-05 | Discharge: 2020-04-05 | Payer: MEDICARE

## 2020-04-05 DIAGNOSIS — D702 Other drug-induced agranulocytosis: Principal | ICD-10-CM

## 2020-04-05 LAB — CBC W/ AUTO DIFF
BASOPHILS ABSOLUTE COUNT: 0.2 10*9/L — ABNORMAL HIGH (ref 0.0–0.1)
BASOPHILS RELATIVE PERCENT: 16.7 %
EOSINOPHILS ABSOLUTE COUNT: 0.1 10*9/L (ref 0.0–0.7)
EOSINOPHILS RELATIVE PERCENT: 7.4 %
HEMATOCRIT: 36 % — ABNORMAL LOW (ref 38.0–50.0)
HEMOGLOBIN: 11.8 g/dL — ABNORMAL LOW (ref 13.5–17.5)
LYMPHOCYTES ABSOLUTE COUNT: 0.2 10*9/L — ABNORMAL LOW (ref 0.7–4.0)
LYMPHOCYTES RELATIVE PERCENT: 21 %
MEAN CORPUSCULAR HEMOGLOBIN CONC: 32.8 g/dL (ref 30.0–36.0)
MEAN CORPUSCULAR HEMOGLOBIN: 31.7 pg (ref 26.0–34.0)
MEAN CORPUSCULAR VOLUME: 96.6 fL — ABNORMAL HIGH (ref 81.0–95.0)
MEAN PLATELET VOLUME: 7.3 fL (ref 7.0–10.0)
MONOCYTES ABSOLUTE COUNT: 0.1 10*9/L (ref 0.1–1.0)
MONOCYTES RELATIVE PERCENT: 7.8 %
NEUTROPHILS ABSOLUTE COUNT: 0.4 10*9/L — CL (ref 1.7–7.7)
NEUTROPHILS RELATIVE PERCENT: 47.1 %
NUCLEATED RED BLOOD CELLS: 1 /100{WBCs} (ref <=4)
PLATELET COUNT: 227 10*9/L (ref 150–450)
RED BLOOD CELL COUNT: 3.73 10*12/L — ABNORMAL LOW (ref 4.32–5.72)
RED CELL DISTRIBUTION WIDTH: 21.2 % — ABNORMAL HIGH (ref 12.0–15.0)
WBC ADJUSTED: 0.9 10*9/L — CL (ref 3.5–10.5)

## 2020-04-05 LAB — RENAL FUNCTION PANEL
ALBUMIN: 4.1 g/dL (ref 3.4–5.0)
ANION GAP: 3 mmol/L — ABNORMAL LOW (ref 5–14)
BLOOD UREA NITROGEN: 19 mg/dL (ref 9–23)
BUN / CREAT RATIO: 12
CALCIUM: 10.5 mg/dL — ABNORMAL HIGH (ref 8.7–10.4)
CHLORIDE: 115 mmol/L — ABNORMAL HIGH (ref 98–107)
CO2: 21.9 mmol/L (ref 20.0–31.0)
CREATININE: 1.57 mg/dL — ABNORMAL HIGH
EGFR CKD-EPI AA MALE: 52 mL/min/{1.73_m2} — ABNORMAL LOW (ref >=60)
EGFR CKD-EPI NON-AA MALE: 45 mL/min/{1.73_m2} — ABNORMAL LOW (ref >=60)
GLUCOSE RANDOM: 114 mg/dL (ref 70–179)
PHOSPHORUS: 3.9 mg/dL (ref 2.4–5.1)
POTASSIUM: 5.3 mmol/L — ABNORMAL HIGH (ref 3.4–4.5)
SODIUM: 140 mmol/L (ref 135–145)

## 2020-04-05 LAB — TACROLIMUS LEVEL: TACROLIMUS BLOOD: 6.6 ng/mL

## 2020-04-05 LAB — SLIDE REVIEW

## 2020-04-05 LAB — MAGNESIUM: MAGNESIUM: 1.9 mg/dL (ref 1.6–2.6)

## 2020-04-05 MED ORDER — TBO-FILGRASTIM 480 MCG/0.8 ML SUBCUTANEOUS SYRINGE
Freq: Every day | SUBCUTANEOUS | 0 refills | 1.00000 days | Status: CP
Start: 2020-04-05 — End: 2020-04-05

## 2020-04-05 MED ADMIN — tbo-filgrastim (GRANIX) injection 480 mcg: 480 ug | SUBCUTANEOUS | @ 16:00:00 | Stop: 2020-04-05

## 2020-04-05 NOTE — Unmapped (Signed)
confirmed with patient appointment for today for Granix 480     Pt. Confirmed he has transportation from his sister    Pt. Aware of 11 am appt. At [facility] main hospital.

## 2020-04-05 NOTE — Unmapped (Signed)
After reviwing labs and low WBC with Dr. Nestor Lewandowsky and Cleone Slim, from 8/30   Pt asked to get Nupogen injection 480 mcg     Pt. And pt. Wife having trouble with transportation and reacing out to niece to confirm possibility of going to [place] today. 8-30

## 2020-04-05 NOTE — Unmapped (Signed)
Per provider's orders, Granix administered. Patient tolerated injection well with no complications. Please see MAR for administration info.

## 2020-04-06 LAB — CMV DNA, QUANTITATIVE, PCR
CMV QUANT LOG10: 2.01 {Log_IU}/mL — ABNORMAL HIGH (ref <0.00)
CMV QUANT: 102 [IU]/mL — ABNORMAL HIGH (ref <0)

## 2020-04-06 LAB — CMV VIRAL LD: Lab: 0

## 2020-04-06 MED FILL — AMLODIPINE 10 MG TABLET: 30 days supply | Qty: 30 | Fill #8 | Status: AC

## 2020-04-06 MED FILL — AMLODIPINE 10 MG TABLET: ORAL | 30 days supply | Qty: 30 | Fill #8

## 2020-04-06 NOTE — Unmapped (Unsigned)
Brief Pharmacy Note    -Provided patient and his wife with updated medication list  -Decrease mg plus protein to 133 mg BID  -Move amlodipine to HS  -Will decrease Valcyte to 450 mg daily given neutropenia and that CMV VL has been <200 since July   -If neutropenia continues consider letermovir vs stopping Valcyte w/VL monitoring   -Pt's wife can administer G-CSF at home if approved by insurance (PA pending)      Time spent with patient: 5 min    Frank Leach, Frank Leach, Frank Leach  Abdominal Transplant Clinical Pharmacist Practitioner ??? aspirin (ECOTRIN) 81 MG tablet Take 1 tablet (81 mg total) by mouth daily. 30 tablet 11   ??? atorvastatin (LIPITOR) 40 MG tablet Take 1 tablet (40 mg total) by mouth daily. (Patient taking differently: Take 40 mg by mouth nightly. ) 90 tablet 3   ??? carvediloL (COREG) 6.25 MG tablet Take 2 tablets (12.5 mg total) by mouth Two (2) times a day. 360 tablet 3   ??? cholecalciferol, vitamin D3-125 mcg, 5,000 unit,, 125 mcg (5,000 unit) tablet Take 1 tablet (125 mcg total) by mouth daily.     ??? ENVARSUS XR 4 mg Tb24 extended release tablet Take 2 tablets (8 mg total) by mouth daily. 60 tablet 11   ??? magnesium oxide-Mg AA chelate (MAGNESIUM, AMINO ACID CHELATE,) 133 mg Tab Take 3 tablets (399 mg total) by mouth two (2) times a day. 180 tablet 11   ??? mycophenolate (MYFORTIC) 180 MG EC tablet Take 1 tablet (180 mg total) by mouth Two (2) times a day. 60 tablet 11   ??? omeprazole (PRILOSEC) 40 MG capsule Take 1 capsule (40 mg total) by mouth every other day. 45 capsule 3   ??? valGANciclovir (VALCYTE) 450 mg tablet Take 1 tablet (450 mg total) by mouth Two (2) times a day. 60 tablet 5     No facility-administered encounter medications on file as of 04/07/2020.     Induction agent : alemtuzumab    CURRENT IMMUNOSUPPRESSION: Envarsus 8 mg PO qd   prograf/Envarsus/cyclosporine goal: 6-8   myfortic on hold for CMV   steroid free     Patient is tolerating immunosuppression well    IMMUNOSUPPRESSION DRUG LEVELS:  Lab Results   Component Value Date    Tacrolimus, Trough 5.2 03/10/2020    Tacrolimus, Trough 7.2 01/19/2020    Tacrolimus, Trough 2.1 (L) 06/26/2019    Tacrolimus, Timed 6.6 04/05/2020    Tacrolimus, Timed 6.0 03/29/2020    Tacrolimus, Timed 4.6 03/22/2020     No results found for: CYCLO  No results found for: EVEROLIMUS  No results found for: SIROLIMUS    Envarsus level is accurate 24 hour trough    Graft function: stable  DSA: ntd  Biopsies to date: mild to moderate arteriosclerosis  UPC: 0.053 on 01/15/20  WBC/ANC: wnl    Plan: Will maintain current immunosuppression.  Once CMV clears consider adding back Myfortic. Continue to monitor.    OI Prophylaxis:   CMV Status: D+/ R+, moderate risk . CMV treatment: Valcyte 450 mg BID since 6/15  Estimated Creatinine Clearance: 42.7 mL/min (A) (based on SCr of 1.57 mg/dL (H)).  Lab Results   Component Value Date    CMV Quant 144 (H) 03/29/2020    CMV Quant 107 (H) 03/22/2020    CMV Quant 107 (H) 03/15/2020    CMV Quant 76 (H) 03/08/2020    CMV Quant 98 (H) 03/01/2020    CMV Quant 220 (H) 02/25/2020  PCP Prophylaxis: bactrim SS 1 tab MWF x 6 months complete  Thrush: completed in hospital    Plan: CMV pending today.  Once completes Valcyte will need to monitor CMV x8 weeks on labs. Continue to monitor.    CV Prophylaxis: asa 81 mg   The 10-year ASCVD risk score Denman George DC Jr., et al., 2013) is: 44.2%  Statin therapy: Indicated; currently on atorvastatin 40 mg daily  Plan: Continue to monitor     BP: Goal < 140/90. Clinic vitals reported above  Home BP ranges: 130-140/70  Current meds include: amlodipine 10 mg daily; carvedilol 12.5 mg BID  Plan: IContinue to monitor     Anemia of CKD:  H/H:   Lab Results   Component Value Date    HGB 11.8 (L) 04/05/2020     Lab Results   Component Value Date    HCT 36.0 (L) 04/05/2020     Iron panel:  Lab Results   Component Value Date    IRON 92 06/25/2019    TIBC 136.1 (L) 06/25/2019    FERRITIN 1,820.0 (H) 04/20/2016     Lab Results   Component Value Date    Iron Saturation (%) 68 (H) 06/25/2019    Iron Saturation (%) 28 12/25/2011       Prior ESA use: none post txp  Plan: stable. Continue to monitor.     DM:   Lab Results   Component Value Date    A1C 5.4 03/01/2020   . Goal A1c < 7  History of Dm? Yes: T2DM pre transplant that was diet controlled  Established with endocrinologist/PCP for BG managment? No  Currently on: metformin 1000 mg XR daily, Rybelsus 3 mg daily  Home BS log: checks FBG daily 130-140  Diet: excellent appetite  Exercise:not yet  Fluid intake: 4-5 bottles daily  Plan:  Stop Rybelsus given >$200 copay. Will see if Tradjenta covered.  If not will switch to glipizide.    Electrolytes: K6.3  Meds currently on: mg plus protein 399 mg TID  Plan: Decrease mg plus protein to 399 mg BID. Kayexalate 15 g x1.  Monitor K+ and if needs maintenance therapy (may need MFR assistance for Lokelma) Continue to monitor      GI/BM: pt reports no complaints  Meds currently on: omeprazole 20 mg QOD  Plan: Change PPI to PRN. Continue to monitor    Pain: pt reports mild incisional site pain  Meds currently on: APAP PRN (not using)  Plan: Continue to monitor    Bone health:   Vitamin D Level: 52 on 11/27/19. Goal > 30.   Last DEXA results:  none available  Current meds include: ergocalciferol 50,000 units weekly  Plan: Vitamin D level  within goal.  Will stop ergocalciferol when he runs out and transition to cholecalciferol 5,000 units daily. Continue to monitor.     Women's/Men's Health:  Frank Leach is a 69 y.o. male. Patient reports no men's/women's health issues  Plan: Continue to monitor    Gout - last gout flare >2 years ago  Meds currently on: allopurinol 100 mg daily  Plan: continue to monitor    Erectile dysfunction  Meds currently on: papaverine/phentolamine/alprostadil penile injection PRN (hasn't used yet)  Plan: Continue to monitor    Pharmacy preference:  [facility]    Adherence: Patient has poor understanding of medications; was not able to independently identify names/doses of immunosuppressants and OI meds.  Patient  does not fill their own pill box on  a regular basis at home.  His wife manages meds  Patient brought medication card:yes  Pill box:did not bring  Plan: Encouraged patient to start to learn meds and eventually help with pill box fill; provided moderate adherence counseling/intervention    I spent a total of 20 minutes face to face with the patient delivering clinical care and providing education/counseling.    Patient was reviewed with Dr. Nestor Lewandowsky who was agreement with the stated plan:     During this visit, the following was completed:   BG log data assessment  BP log data assessment  Labs ordered and evaluated  complex treatment plan >1 DS     All questions/concerns were addressed to the patient's satisfaction.  __________________________________________    Frank Leach, Frank Leach, Frank Leach  SOLID [employer] TRANSPLANT CLINICAL PHARMACIST PRACTITIONER  PAGER [phone]

## 2020-04-07 ENCOUNTER — Ambulatory Visit: Admit: 2020-04-07 | Discharge: 2020-04-08 | Payer: MEDICARE

## 2020-04-07 ENCOUNTER — Encounter: Admit: 2020-04-07 | Discharge: 2020-04-08 | Payer: MEDICARE | Attending: Nephrology | Primary: Nephrology

## 2020-04-07 ENCOUNTER — Encounter: Admit: 2020-04-07 | Discharge: 2020-04-08 | Payer: MEDICARE

## 2020-04-07 DIAGNOSIS — Z940 Kidney transplant status: Principal | ICD-10-CM

## 2020-04-07 DIAGNOSIS — I313 Pericardial effusion (noninflammatory): Principal | ICD-10-CM

## 2020-04-07 DIAGNOSIS — B259 Cytomegaloviral disease, unspecified: Principal | ICD-10-CM

## 2020-04-07 DIAGNOSIS — Z79899 Other long term (current) drug therapy: Principal | ICD-10-CM

## 2020-04-07 DIAGNOSIS — D702 Other drug-induced agranulocytosis: Principal | ICD-10-CM

## 2020-04-07 DIAGNOSIS — D849 Immunodeficiency, unspecified: Principal | ICD-10-CM

## 2020-04-07 LAB — CBC W/ AUTO DIFF
BASOPHILS ABSOLUTE COUNT: 0 10*9/L (ref 0.0–0.1)
BASOPHILS RELATIVE PERCENT: 1.3 %
EOSINOPHILS ABSOLUTE COUNT: 0.1 10*9/L (ref 0.0–0.7)
EOSINOPHILS RELATIVE PERCENT: 9.6 %
HEMATOCRIT: 37.3 % — ABNORMAL LOW (ref 38.0–50.0)
HEMOGLOBIN: 12.2 g/dL — ABNORMAL LOW (ref 13.5–17.5)
LYMPHOCYTES ABSOLUTE COUNT: 0.2 10*9/L — ABNORMAL LOW (ref 0.7–4.0)
LYMPHOCYTES RELATIVE PERCENT: 23.2 %
MEAN CORPUSCULAR HEMOGLOBIN CONC: 32.8 g/dL (ref 30.0–36.0)
MEAN CORPUSCULAR HEMOGLOBIN: 31.3 pg (ref 26.0–34.0)
MEAN CORPUSCULAR VOLUME: 95.5 fL — ABNORMAL HIGH (ref 81.0–95.0)
MEAN PLATELET VOLUME: 7.5 fL (ref 7.0–10.0)
MONOCYTES ABSOLUTE COUNT: 0.2 10*9/L (ref 0.1–1.0)
MONOCYTES RELATIVE PERCENT: 16.7 %
NEUTROPHILS ABSOLUTE COUNT: 0.5 10*9/L — ABNORMAL LOW (ref 1.7–7.7)
NEUTROPHILS RELATIVE PERCENT: 49.2 %
RED BLOOD CELL COUNT: 3.91 10*12/L — ABNORMAL LOW (ref 4.32–5.72)
RED CELL DISTRIBUTION WIDTH: 20.9 % — ABNORMAL HIGH (ref 12.0–15.0)
WBC ADJUSTED: 1.1 10*9/L — CL (ref 3.5–10.5)

## 2020-04-07 LAB — RENAL FUNCTION PANEL
ALBUMIN: 4.3 g/dL (ref 3.4–5.0)
ANION GAP: 3 mmol/L — ABNORMAL LOW (ref 5–14)
BLOOD UREA NITROGEN: 21 mg/dL (ref 9–23)
BUN / CREAT RATIO: 13
CHLORIDE: 113 mmol/L — ABNORMAL HIGH (ref 98–107)
CO2: 24.6 mmol/L (ref 20.0–31.0)
CREATININE: 1.57 mg/dL — ABNORMAL HIGH
EGFR CKD-EPI AA MALE: 52 mL/min/{1.73_m2} — ABNORMAL LOW (ref >=60)
EGFR CKD-EPI NON-AA MALE: 45 mL/min/{1.73_m2} — ABNORMAL LOW (ref >=60)
PHOSPHORUS: 3.7 mg/dL (ref 2.4–5.1)
POTASSIUM: 5.7 mmol/L — ABNORMAL HIGH (ref 3.4–4.5)
SODIUM: 141 mmol/L (ref 135–145)

## 2020-04-07 LAB — URINALYSIS
BILIRUBIN UA: NEGATIVE
BLOOD UA: NEGATIVE
KETONES UA: NEGATIVE
LEUKOCYTE ESTERASE UA: NEGATIVE
PH UA: 7 (ref 5.0–9.0)
RBC UA: 1 /HPF (ref <3)
SPECIFIC GRAVITY UA: 1.015 (ref 1.005–1.030)
SQUAMOUS EPITHELIAL: 1 /HPF (ref 0–5)
UROBILINOGEN UA: 0.2
WBC UA: 4 /HPF — ABNORMAL HIGH (ref <2)

## 2020-04-07 LAB — SLIDE REVIEW

## 2020-04-07 LAB — MAGNESIUM: Magnesium:MCnc:Pt:Ser/Plas:Qn:: 1.9

## 2020-04-07 LAB — PROTEIN/CREAT RATIO, URINE: Protein/Creatinine:MRto:Pt:Urine:Qn:: 0.383

## 2020-04-07 LAB — BILIRUBIN UA: Bilirubin:PrThr:Pt:Urine:Ord:Test strip: NEGATIVE

## 2020-04-07 LAB — PROTEIN / CREATININE RATIO, URINE
CREATININE, URINE: 87.4 mg/dL
PROTEIN/CREAT RATIO, URINE: 0.383

## 2020-04-07 LAB — MONOCYTES ABSOLUTE COUNT: Monocytes:NCnc:Pt:Bld:Qn:Automated count: 0.2

## 2020-04-07 LAB — SMEAR REVIEW

## 2020-04-07 LAB — CHLORIDE: Chloride:SCnc:Pt:Ser/Plas:Qn:: 113 — ABNORMAL HIGH

## 2020-04-07 MED ORDER — MG-PLUS-PROTEIN 133 MG TABLET
ORAL_TABLET | Freq: Two times a day (BID) | ORAL | 11 refills | 50 days | Status: CP
Start: 2020-04-07 — End: ?

## 2020-04-07 MED ORDER — ZARXIO 480 MCG/0.8 ML INJECTION SYRINGE
SUBCUTANEOUS | 0 refills | 28 days | Status: CP
Start: 2020-04-07 — End: ?

## 2020-04-07 MED ORDER — VALGANCICLOVIR 450 MG TABLET
ORAL_TABLET | Freq: Every day | ORAL | 1 refills | 30 days | Status: CP
Start: 2020-04-07 — End: ?
  Filled 2020-05-10: qty 30, 30d supply, fill #0

## 2020-04-07 MED ORDER — GRANIX 480 MCG/0.8 ML SUBCUTANEOUS SYRINGE
SUBCUTANEOUS | 0 refills | 28.00000 days | Status: CP
Start: 2020-04-07 — End: 2020-04-07

## 2020-04-07 NOTE — Unmapped (Signed)
reviewed labs with Dr. Nestor Lewandowsky and no changes or Granix at this time. Pt. Valcyte was decreased today 9-1

## 2020-04-07 NOTE — Unmapped (Signed)
Transplant Nephrology Clinic Visit    Subjective/Interval:   Since patient's last visit in the transplant clinic - patient has been doing well in terms of transplant, taking transplant medications regularly, no episodes of rejection and no side effects of medications.    He was admitted 8/4-03/11/20 after he was found by EMS to be hypoglycemic (BG down to 36) in the setting of recently switching from semaglutide to glipizide. He received D50 and D5 infusion and received a single dose of octreotide. Metformin and glipizide were held at discharge.    He received a Granix 480 mcg injection on 04/05/20 due to WBC 0.9.    He presents today feeling overall well, with home BGs well-controlled (120-140s) since recent hospitalization. He is still holding metformin and glipizide. His appetite has improved and he is gaining back weight. Denies diarrhea. Home BP have been runnnig 120-140s/60-90s.    Last dose of Envarsus at 9 am.     Assessment:  69 y.o. 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       Recommendations/Plan:     Allograft Function: DDKT KDPI: 42%06/22/2019  Renal function holding relatively stable with electrolytes and acid base balanced.     Baseline Cr: ~ 1.57  Last Cr:  1.79 Date:  02/25/20   Decoy/ BK     neg Date:  01/15/20   DSA     neg Date:  01/19/20  CMV:     1,975 Date:  02/16/20  UPC:     0.053 Date:  01/15/20     Patient asked to continue with strict k restriction due to hyperkalemia   If creatinine remains high at 1.8/1.9 will do kidney biopsy in 2 weeks. Repeat level pending today.    Immunosuppression Management [High Risk Medical Decision Making For Drug Therapy Requiring Intensive Monitoring For Toxicity]:     Tacro target: 7-9  Tacro last lvl /date: 5.2 (04/07/20). Repeat today will be a good trough (~24.5h)  MMF/MPA dose: 180 mg BID  Side effects: none    Blood Pressure Management: Home BPs have been running slightly high (120-140s/70-90s). Continue Coreg and switch amlodipine to take at nighttime.    Lipid Management: Last lipid panel on 08/20/2018.  The 10-year ASCVD risk score Denman George DC Jr., et al., 2013) is: 36.3%  Currently taking atorvastatin     Electrolytes: Patient asked to continue drinking more water, labs reflect hyperkalemia  - 5.7. Monitor for now. Magnesium in normal range. Most recent 1.9.   Ergocalciferol to be continued.    Infectious Prophylaxis and Monitoring: CMV VL 102 (04/05/20). Repeat level pending today. Decoy cells not detected 01/15/20. BK VL not detected 01/26/20.  - Once CMV VL becomes undetected, will discontinue Valcyte. Anticipate WBC will improve once off the medication.    Gout: No flares recently.  Continue on Allopurinol.     RCC s/p bilateral nephrectomy:   Last CT RMP 08/29/2018 showed no suspicious enhancing lesions.   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 Will monitor home BG and consider restarting at next visit if needed    Left Lower Extremity Swelling:  Ordered US of left leg to rule out  blood clot.   If negative for blood clot, will start on a diuretic.     Health Maintenance:   Colonoscopy: 04/30/2018 - 1 adenomatous polyp; repeat 5-10 years    Immunizations:   Flu Shot: 04/30/2019  Prevnar 13: 10/30/2013  Pneumovax: 04/24/2016, due 2022  No immunizations till 1 year post transplant.  COVID-19 (Moderna): 09/12/19, 10/14/19; will receive 3rd dose (booster shot) in clinic today      History of Present Illness    69 y.o. male here for follow up after kidney transplantation.  Patient has ESRD secondary to HTN.  Patient has been on dialysis since 01/25/2012.  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 Patient was admitted for kidney transplant on 06/21/2019.  Patient experienced hyperkalemia that despite medical management would not decrease.  2 hours of HD performed.  Patient discharged 06/26/2019.       Transplant History:    Organ Received: Left DDKT, DBD, PHS, KDPI: 42%; 21h cold ischemia  Native Kidney Disease: HTN; cPRA: 0%  Date of Transplant: 06/22/2019  Post-Transplant Course: HD once for high potassium  Prior Transplants: none  Induction: [place]  Date of Ureteral Stent Removal: 07/29/2019  CMV/EBV Status: CMV D+/R+  EBV D+/R+  Rejection Episodes: none  Donor Specific Antibodies: none  Results of Renal Imaging (pre and post):   Pre-Txp 08/29/2018 CT RMP  Sequela of bilateral nephrectomy. Interval decrease in linear soft tissue within the right nephrectomy bed, potentially postsurgical or fat necrosis. Unchanged linear tissue within the left nephrectomy bed. No suspicious enhancing lesions are visualized within the surgical beds    Post-Txp 06/25/2019 (txp kidney only)  The renal transplant was located in the left lower quadrant. Normal size and echogenicity.  No solid masses or calculi. Trace perinephric fluid adjacent to the lower pole of the kidney. Mild pelviectasis  - Perfusion: Using power Doppler, normal perfusion was seen throughout the renal parenchyma.  - Resistive indices in the renal transplant are stable compared with prior examination.  - Main renal artery/iliac artery: Patent. Again noted are 3 renal arteries. Resistive indices within the renal arteries are stable to minimally increased, now at or just above normal limits.  - Main renal vein/iliac vein: Patent    Past Medical History  1. HTN  2. DM II  3. RCC s/p Bilateral nephrectomy (L 04/2016, R 12/2016)  4. GERD  5. HLD  6. 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    Medications  Current Outpatient Medications   Medication Sig Dispense Refill   ??? acetaminophen (TYLENOL) 500 MG tablet Take 1-2 tablets (500-1,000 mg total) by mouth every six (6) hours as needed for pain or fever (> 38C). 100 tablet 0   ??? allopurinoL (ZYLOPRIM) 100 MG tablet Take 1 tablet (100 mg total) by mouth every evening. 30 tablet 11   ??? amLODIPine (NORVASC) 10 MG tablet Take 1 tablet (10 mg total) by mouth daily. 30 tablet 11   ??? aspirin (ECOTRIN) 81 MG tablet Take 1 tablet (81 mg total) by mouth daily. 30 tablet 11   ??? atorvastatin (LIPITOR) 40 MG tablet Take 1 tablet (40 mg total) by mouth daily. (Patient taking differently: Take 40 mg by mouth nightly. ) 90 tablet 3   ??? carvediloL (COREG) 6.25 MG tablet Take 2 tablets (12.5 mg total) by mouth Two (2) times a day.  360 tablet 3   ??? cholecalciferol, vitamin D3-125 mcg, 5,000 unit,, 125 mcg (5,000 unit) tablet Take 1 tablet (125 mcg total) by mouth daily.     ??? ENVARSUS XR 4 mg Tb24 extended release tablet Take 2 tablets (8 mg total) by mouth daily. 60 tablet 11   ??? magnesium oxide-Mg AA chelate (MAGNESIUM, AMINO ACID CHELATE,) 133 mg Tab Take 3 tablets (399 mg total) by mouth two (2) times a day. 180 tablet 11   ??? mycophenolate (MYFORTIC) 180 MG EC tablet Take 1 tablet (180 mg total) by mouth Two (2) times a day. 60 tablet 11   ??? omeprazole (PRILOSEC) 40 MG capsule Take 1 capsule (40 mg total) by mouth every other day. 45 capsule 3   ??? valGANciclovir (VALCYTE) 450 mg tablet Take 1 tablet (450 mg total) by mouth Two (2) times a day. 60 tablet 5     No current facility-administered medications for this visit.     BP 167/69  - Pulse 57  - Temp 36.3 ??C (97.4 ??F) (Temporal)  - Ht 170.2 cm (5' 7)  - Wt 73.5 kg (162 lb)  - BMI 25.37 kg/m??     Nursing note and vitals reviewed.   Constitutional: Oriented to person, place, and time. Appears well-developed and well-nourished. No distress.   HENT: wearing mask   Head: Normocephalic and atraumatic.   Eyes: Right eye exhibits no discharge. Left eye exhibits no discharge. No scleral icterus.   Neck: Normal range of motion. Neck supple.   Cardiovascular: chronic systolic murmur 4/6  Pulmonary/Chest: Effort normal and breath sounds normal. No respiratory distress.   Abdominal: Soft. Non tender  Musculoskeletal: No BLE edema  Neurological: Alert and oriented to person, place, and time.   Skin: Skin is warm and dry. No rash noted. Not diaphoretic. No erythema. No pallor.   Psychiatric: Normal mood and affect. Behavior is normal. Judgment and thought content normal.     Laboratory Data and Imaging reviewed in EPIC    Follow-Up:  Return to clinic in 6 weeks   Labs: twice a week  Patient will continue to follow-up with his primary care provider for non-transplant related issues and medication refills. We have ordered transplant specific labs per the center's guidelines to monitor and assess for toxicities from immunosuppressant drug therapy      Scribe's Attestation: Leeroy Bock, MD obtained and performed the history, physical exam and medical decision making elements that were entered into the chart.  Signed by Gaye Alken, Scribe, on April 07, 2020 at 9:40 AM.    Documentation assistance provided by the Scribe. I was present during the time the encounter was recorded. The information recorded by the Scribe was done at my direction and has been reviewed and validated by me.

## 2020-04-07 NOTE — Unmapped (Signed)
Called patient to notify him that follow up results from CT were stable. And that per Dr Nestor Lewandowsky he will be scheduled for a ECHO to follow upon pericardial effusion that was newly seen on CT of abd, which is a new finding.     Pt. Wife away  Order placed, routine.

## 2020-04-07 NOTE — Unmapped (Signed)
Transplant Coordinator, Clinic Visit   Pt seen today by transplant nephrology for follow up, reviewed medications and symptoms.          04/07/20 0918   BP: 167/69   Pulse: 57   Temp: 36.3 ??C (97.4 ??F)   Weight: 73.5 kg (162 lb)   Height: 170.2 cm (5' 7)   PainSc: 0-No pain     Met with patient and wife today. Pt. Doing well     Assessment  BP: @ home 149/70  BG: @ home lows 126 and average 140 , has been off glipizide since recent admission   Headache: none  Hand tremors: none  Numbness/tingling: tingling in Left foot once and a while   Fevers: none  Chills/sweats: none  Shortness of breath: none  Chest pain or pressure: none  Palpitations: none  Nausea/vomiting: none  Diarrhea/constipation: none  UTI symptoms: none  Swelling: improving   Sleep: none  Pain: none  I  Good appetite; reports adequate hydration.     Intake: pt. Drinking 48 oz of fluids to include water and gatorade, encouraged patient to drink 60-70 oz   Output: normal    Any new medications? none  Immunosuppressant last taken: took his Envarsus yesterday 900 am     New med action pro given today with updated Medications.     Immunization status: wants COVID booster today    Functional Score: 90     Able to carry on normal activity;  Minor signs or symptoms of disease.  Employment status is: retired    I spent a total of 5 minutes with [employer] reviewing medications and symptoms.      Patient going to COVID booster today. Getting labs drawn and going for repeat CT  Discussed with patient possibliy needing Granix and will get doses at home if able per Vernona Rieger Mincemoyer      Pt. Continues to get weekly labs, he gets his medications OK

## 2020-04-08 DIAGNOSIS — Z940 Kidney transplant status: Principal | ICD-10-CM

## 2020-04-08 LAB — TACROLIMUS, TROUGH: Lab: 5.2

## 2020-04-08 LAB — TACROLIMUS LEVEL, TROUGH: TACROLIMUS, TROUGH: 5.2 ng/mL (ref 5.0–15.0)

## 2020-04-08 MED ORDER — TACROLIMUS XR 1 MG TABLET,EXTENDED RELEASE 24 HR
ORAL_TABLET | Freq: Every day | ORAL | 11 refills | 30 days | Status: CP
Start: 2020-04-08 — End: ?
  Filled 2020-05-10: qty 60, 30d supply, fill #0

## 2020-04-08 MED ORDER — ENVARSUS XR 4 MG TABLET,EXTENDED RELEASE
ORAL_TABLET | Freq: Every day | ORAL | 11 refills | 30 days | Status: CP
Start: 2020-04-08 — End: ?

## 2020-04-08 NOTE — Unmapped (Signed)
After reviewing recent labs Frank Leach,  Can increase to 9 mg of Envarsus to start 9-3     Gave instructions to wife she verbalizes change.   Updated Rx

## 2020-04-10 LAB — CMV DNA, QUANTITATIVE, PCR: CMV QUANT LOG10: 1.96 {Log_IU}/mL — ABNORMAL HIGH (ref <0.00)

## 2020-04-10 LAB — CMV QUANT LOG10: Lab: 1.96 — ABNORMAL HIGH

## 2020-04-12 NOTE — Unmapped (Signed)
[facility] Specialty Medication Onboarding    Specialty Medication: Zarxio  Prior Authorization: Approved   Financial Assistance: No - patient doesn't qualify for additional assistance   Final Copay/Day Supply: $474.22 / 28 days    Insurance Restrictions: Yes - max 1 month supply     Notes to Pharmacist: New medication.     The triage team has completed the benefits investigation and has determined that the patient is able to fill this medication at [facility]. Please contact the patient to complete the onboarding or follow up with the prescribing physician as needed.

## 2020-04-13 ENCOUNTER — Encounter: Admit: 2020-04-13 | Discharge: 2020-04-14 | Payer: MEDICARE

## 2020-04-13 LAB — RENAL FUNCTION PANEL
ALBUMIN: 4 g/dL (ref 3.4–5.0)
ANION GAP: 2 mmol/L — ABNORMAL LOW (ref 5–14)
BLOOD UREA NITROGEN: 25 mg/dL — ABNORMAL HIGH (ref 9–23)
BUN / CREAT RATIO: 15
CALCIUM: 10.5 mg/dL — ABNORMAL HIGH (ref 8.7–10.4)
CHLORIDE: 116 mmol/L — ABNORMAL HIGH (ref 98–107)
CO2: 23.4 mmol/L (ref 20.0–31.0)
CREATININE: 1.66 mg/dL — ABNORMAL HIGH
EGFR CKD-EPI AA MALE: 49 mL/min/{1.73_m2} — ABNORMAL LOW (ref >=60)
EGFR CKD-EPI NON-AA MALE: 42 mL/min/{1.73_m2} — ABNORMAL LOW (ref >=60)
PHOSPHORUS: 4.2 mg/dL (ref 2.4–5.1)
POTASSIUM: 5.5 mmol/L — ABNORMAL HIGH (ref 3.4–4.5)
SODIUM: 141 mmol/L (ref 135–145)

## 2020-04-13 LAB — CBC W/ AUTO DIFF
BASOPHILS ABSOLUTE COUNT: 0.3 10*9/L — ABNORMAL HIGH (ref 0.0–0.1)
BASOPHILS RELATIVE PERCENT: 11.4 %
EOSINOPHILS ABSOLUTE COUNT: 0.1 10*9/L (ref 0.0–0.7)
EOSINOPHILS RELATIVE PERCENT: 3.6 %
HEMATOCRIT: 36.2 % — ABNORMAL LOW (ref 38.0–50.0)
HEMOGLOBIN: 11.9 g/dL — ABNORMAL LOW (ref 13.5–17.5)
LYMPHOCYTES ABSOLUTE COUNT: 0.3 10*9/L — ABNORMAL LOW (ref 0.7–4.0)
LYMPHOCYTES RELATIVE PERCENT: 11.8 %
MEAN CORPUSCULAR HEMOGLOBIN CONC: 32.9 g/dL (ref 30.0–36.0)
MEAN CORPUSCULAR HEMOGLOBIN: 31.4 pg (ref 26.0–34.0)
MEAN CORPUSCULAR VOLUME: 95.3 fL — ABNORMAL HIGH (ref 81.0–95.0)
MEAN PLATELET VOLUME: 7.2 fL (ref 7.0–10.0)
MONOCYTES ABSOLUTE COUNT: 0.5 10*9/L (ref 0.1–1.0)
MONOCYTES RELATIVE PERCENT: 22.4 %
NEUTROPHILS ABSOLUTE COUNT: 1.2 10*9/L — ABNORMAL LOW (ref 1.7–7.7)
NEUTROPHILS RELATIVE PERCENT: 50.8 %
NUCLEATED RED BLOOD CELLS: 0 /100{WBCs} (ref <=4)
PLATELET COUNT: 225 10*9/L (ref 150–450)
RED CELL DISTRIBUTION WIDTH: 20.7 % — ABNORMAL HIGH (ref 12.0–15.0)

## 2020-04-13 LAB — SCHISTOCYTES

## 2020-04-13 LAB — MAGNESIUM: Magnesium:MCnc:Pt:Ser/Plas:Qn:: 1.8

## 2020-04-13 LAB — TACROLIMUS BLOOD: Lab: 8.2

## 2020-04-13 LAB — SLIDE REVIEW

## 2020-04-13 LAB — HEMATOCRIT: Hematocrit:VFr:Pt:Bld:Qn:: 36.2 — ABNORMAL LOW

## 2020-04-13 LAB — ANION GAP: Anion gap 3:SCnc:Pt:Ser/Plas:Qn:: 2 — ABNORMAL LOW

## 2020-04-13 NOTE — Unmapped (Addendum)
9/7 update: per [facility], they will speak with patient re: zarxio and most likely he will get it in clinic. Do not call him at this time per clinic, they will let us know if this changes in the future -ef      Zarxio PA approved but still high copay and no assistance available per MAPs.  Reached out to clinic for direction, will wait to call patient until clinic advises.

## 2020-04-13 NOTE — Unmapped (Signed)
Clinical Assessment Needed For: Dose Change  Medication: Envarsus  Last Fill Date: 03/15/20  Copay $0  Was previous dose already scheduled to fill: No

## 2020-04-14 LAB — CMV DNA, QUANTITATIVE, PCR: CMV QUANT: 68 [IU]/mL — ABNORMAL HIGH (ref <0)

## 2020-04-14 LAB — CMV VIRAL LD: Lab: 0

## 2020-04-19 ENCOUNTER — Encounter: Admit: 2020-04-19 | Discharge: 2020-04-20 | Payer: MEDICARE

## 2020-04-19 DIAGNOSIS — Z940 Kidney transplant status: Principal | ICD-10-CM

## 2020-04-19 DIAGNOSIS — Z79899 Other long term (current) drug therapy: Principal | ICD-10-CM

## 2020-04-19 DIAGNOSIS — B338 Other specified viral diseases: Principal | ICD-10-CM

## 2020-04-19 DIAGNOSIS — N058 Unspecified nephritic syndrome with other morphologic changes: Principal | ICD-10-CM

## 2020-04-19 LAB — CBC W/ AUTO DIFF
BASOPHILS ABSOLUTE COUNT: 0.2 10*9/L — ABNORMAL HIGH (ref 0.0–0.1)
BASOPHILS RELATIVE PERCENT: 4.4 %
EOSINOPHILS RELATIVE PERCENT: 1.8 %
HEMATOCRIT: 36.4 % — ABNORMAL LOW (ref 38.0–50.0)
LYMPHOCYTES ABSOLUTE COUNT: 0.3 10*9/L — ABNORMAL LOW (ref 0.7–4.0)
LYMPHOCYTES RELATIVE PERCENT: 6.7 %
MEAN CORPUSCULAR HEMOGLOBIN CONC: 32.7 g/dL (ref 30.0–36.0)
MEAN CORPUSCULAR HEMOGLOBIN: 31.4 pg (ref 26.0–34.0)
MEAN CORPUSCULAR VOLUME: 96.3 fL — ABNORMAL HIGH (ref 81.0–95.0)
MEAN PLATELET VOLUME: 7.3 fL (ref 7.0–10.0)
MONOCYTES ABSOLUTE COUNT: 0.2 10*9/L (ref 0.1–1.0)
MONOCYTES RELATIVE PERCENT: 4.5 %
NEUTROPHILS ABSOLUTE COUNT: 4.2 10*9/L (ref 1.7–7.7)
NEUTROPHILS RELATIVE PERCENT: 82.6 %
PLATELET COUNT: 232 10*9/L (ref 150–450)
RED BLOOD CELL COUNT: 3.78 10*12/L — ABNORMAL LOW (ref 4.32–5.72)
RED CELL DISTRIBUTION WIDTH: 20.6 % — ABNORMAL HIGH (ref 12.0–15.0)
WBC ADJUSTED: 5.1 10*9/L (ref 3.5–10.5)

## 2020-04-19 LAB — RENAL FUNCTION PANEL
ALBUMIN: 4 g/dL (ref 3.4–5.0)
ANION GAP: 2 mmol/L — ABNORMAL LOW (ref 5–14)
BLOOD UREA NITROGEN: 24 mg/dL — ABNORMAL HIGH (ref 9–23)
BUN / CREAT RATIO: 15
CALCIUM: 10.5 mg/dL — ABNORMAL HIGH (ref 8.7–10.4)
CHLORIDE: 116 mmol/L — ABNORMAL HIGH (ref 98–107)
CO2: 21 mmol/L (ref 20.0–31.0)
CREATININE: 1.63 mg/dL — ABNORMAL HIGH
EGFR CKD-EPI AA MALE: 50 mL/min/{1.73_m2} — ABNORMAL LOW (ref >=60)
EGFR CKD-EPI NON-AA MALE: 43 mL/min/{1.73_m2} — ABNORMAL LOW (ref >=60)
GLUCOSE RANDOM: 103 mg/dL — ABNORMAL HIGH (ref 70–99)
POTASSIUM: 5.4 mmol/L — ABNORMAL HIGH (ref 3.4–4.5)
SODIUM: 139 mmol/L (ref 135–145)

## 2020-04-19 LAB — MAGNESIUM: Magnesium:MCnc:Pt:Ser/Plas:Qn:: 1.7

## 2020-04-19 LAB — HEMATOCRIT: Hematocrit:VFr:Pt:Bld:Qn:: 36.4 — ABNORMAL LOW

## 2020-04-19 LAB — CO2: Carbon dioxide:SCnc:Pt:Ser/Plas:Qn:: 21

## 2020-04-19 LAB — TACROLIMUS BLOOD: Lab: 10.6

## 2020-04-19 NOTE — Unmapped (Signed)
[facility] Clinical Assessment & Refill Coordination Note    Frank Leach, Frank Leach: Dec 31, 1950  Phone: [phone] (home)     All above HIPAA information was verified with patient.     Was a Nurse, learning disability used for this call? No    Specialty Medication(s):   Transplant: Envarsus 1mg , Envarsus 4mg ,  mycophenolic acid 180mg  and valgancyclovir 450mg      Current Outpatient Medications   Medication Sig Dispense Refill   ??? acetaminophen (TYLENOL) 500 MG tablet Take 1-2 tablets (500-1,000 mg total) by mouth every six (6) hours as needed for pain or fever (> 38C). 100 tablet 0   ??? allopurinoL (ZYLOPRIM) 100 MG tablet Take 1 tablet (100 mg total) by mouth every evening. 30 tablet 11   ??? amLODIPine (NORVASC) 10 MG tablet Take 1 tablet (10 mg total) by mouth daily. 30 tablet 11   ??? aspirin (ECOTRIN) 81 MG tablet Take 1 tablet (81 mg total) by mouth daily. 30 tablet 11   ??? atorvastatin (LIPITOR) 40 MG tablet Take 1 tablet (40 mg total) by mouth daily. (Patient taking differently: Take 40 mg by mouth nightly. ) 90 tablet 3   ??? carvediloL (COREG) 6.25 MG tablet Take 2 tablets (12.5 mg total) by mouth Two (2) times a day. 360 tablet 3   ??? cholecalciferol, vitamin D3-125 mcg, 5,000 unit,, 125 mcg (5,000 unit) tablet Take 1 tablet (125 mcg total) by mouth daily.     ??? ENVARSUS XR 4 mg Tb24 extended release tablet Take 2 tablets (8 mg total) by mouth daily. Take 2 (4 mg ) tablets in addition to 1 (1mg  ) tablet for a total daily dose of 9 mg 60 tablet 11   ??? filgrastim-sndz (ZARXIO) 480 mcg/0.8 mL Syrg Inject the content of one syringe (480 mcg total) under the skin once a week. Only take at direction of transplant team 3.2 mL 0   ??? magnesium oxide-Mg AA chelate (MAGNESIUM, AMINO ACID CHELATE,) 133 mg Tab Take 1 tablet by mouth two (2) times a day. 100 tablet 11   ??? mycophenolate (MYFORTIC) 180 MG EC tablet Take 1 tablet (180 mg total) by mouth Two (2) times a day. 60 tablet 11   ??? omeprazole (PRILOSEC) 40 MG capsule Take 1 capsule (40 mg total) by mouth every other day. 45 capsule 3   ??? tacrolimus (ENVARSUS XR) 1 mg Tb24 extended release tablet Take 1 tablet (1 mg total) by mouth daily. Take 1 (1 mg ) tablet in addition to 2 (4 mg) tablets for a total daily dose of 9 mg 30 tablet 11   ??? valGANciclovir (VALCYTE) 450 mg tablet Take 1 tablet (450 mg total) by mouth daily. 30 tablet 1     No current facility-administered medications for this visit.        Changes to medications: Frank Leach reports no changes at this time.    No Known Allergies    Changes to allergies: No    SPECIALTY MEDICATION ADHERENCE     envarsus 1mg   : 20 days of medicine on hand   envarsus 4mg   : 18 days of medicine on hand   Mycophenolate 180mg   : 18 days of medicine on hand   valganciclovir 450mg   : 20 days of medicine on hand       Medication Adherence    Patient reported X missed doses in the last month: 0  Specialty Medication: envarsus 4mg   Patient is on additional specialty medications: Yes  Additional Specialty Medications: Mycophenolate 180mg   Patient Reported Additional Medication X Missed Doses in the Last Month: 0  Patient is on more than two specialty medications: Yes  Specialty Medication: valganciclovir 450mg   Patient Reported Additional Medication X Missed Doses in the Last Month: 0  Specialty Medication: envarsus 1mg   Patient Reported Additional Medication X Missed Doses in the Last Month: 0          Specialty medication(s) dose(s) confirmed: Regimen is correct and unchanged.     Are there any concerns with adherence? No    Adherence counseling provided? Not needed    CLINICAL MANAGEMENT AND INTERVENTION      Clinical Benefit Assessment:    Do you feel the medicine is effective or helping your condition? Yes    Clinical Benefit counseling provided? Not needed    Adverse Effects Assessment:    Are you experiencing any side effects? No    Are you experiencing difficulty administering your medicine? No    Quality of Life Assessment:    How many days over the past month did your transplant  keep you from your normal activities? For example, brushing your teeth or getting up in the morning. 0    Have you discussed this with your provider? Not needed    Therapy Appropriateness:    Is therapy appropriate? Yes, therapy is appropriate and should be continued    DISEASE/MEDICATION-SPECIFIC INFORMATION      N/A    PATIENT SPECIFIC NEEDS     - Does the patient have any physical, cognitive, or cultural barriers? No    - Is the patient high risk? Yes, patient is taking a REMS drug. Medication is dispensed in compliance with REMS program    - Does the patient require a Care Management Plan? No     - Does the patient require physician intervention or other additional services (i.e. nutrition, smoking cessation, social work)? No      SHIPPING     Specialty Medication(s) to be Shipped:   na    Other medication(s) to be shipped: No additional medications requested for fill at this time   Pt wants call back in 1-2 weeks     Changes to insurance: No    Delivery Scheduled: Patient declined refill at this time due to supply on hand.     Medication will be delivered via na to the confirmed na address in [facility].    The patient will receive a drug information handout for each medication shipped and additional FDA Medication Guides as required.  Verified that patient has previously received a Conservation officer, historic buildings.    All of the patient's questions and concerns have been addressed.    Frank Leach   [facility] Specialty Pharmacist

## 2020-04-19 NOTE — Unmapped (Signed)
Called patient to review recent labs and that WBC is improving     Also, noted that if he needs Granix injections will have to be done in clinic.     Asked wife to make a note to the lab on 9-20 for BK to be drawn in addition to standing labs.     Pt. Wife verbalizes understanding.

## 2020-04-21 NOTE — Unmapped (Signed)
After reviewing recent tac trough with Wallace Cullens Mincemoyer, and Dr Nestor Lewandowsky, no change in dose of Envarsus at this time and will get repeat

## 2020-04-26 ENCOUNTER — Ambulatory Visit: Admit: 2020-04-26 | Discharge: 2020-04-27 | Payer: MEDICARE

## 2020-04-26 DIAGNOSIS — D849 Immunodeficiency, unspecified: Principal | ICD-10-CM

## 2020-04-26 DIAGNOSIS — B259 Cytomegaloviral disease, unspecified: Principal | ICD-10-CM

## 2020-04-26 LAB — TACROLIMUS BLOOD: Lab: 7

## 2020-04-26 LAB — CBC W/ AUTO DIFF
BASOPHILS ABSOLUTE COUNT: 0.1 10*9/L (ref 0.0–0.1)
EOSINOPHILS ABSOLUTE COUNT: 0.1 10*9/L (ref 0.0–0.7)
EOSINOPHILS RELATIVE PERCENT: 2.7 %
HEMATOCRIT: 37.3 % — ABNORMAL LOW (ref 38.0–50.0)
HEMOGLOBIN: 12.3 g/dL — ABNORMAL LOW (ref 13.5–17.5)
LYMPHOCYTES ABSOLUTE COUNT: 0.3 10*9/L — ABNORMAL LOW (ref 0.7–4.0)
LYMPHOCYTES RELATIVE PERCENT: 6.2 %
MEAN CORPUSCULAR HEMOGLOBIN CONC: 32.9 g/dL (ref 30.0–36.0)
MEAN CORPUSCULAR HEMOGLOBIN: 31.9 pg (ref 26.0–34.0)
MEAN CORPUSCULAR VOLUME: 97 fL — ABNORMAL HIGH (ref 81.0–95.0)
MEAN PLATELET VOLUME: 7.3 fL (ref 7.0–10.0)
MONOCYTES ABSOLUTE COUNT: 0.2 10*9/L (ref 0.1–1.0)
NEUTROPHILS ABSOLUTE COUNT: 4.5 10*9/L (ref 1.7–7.7)
NEUTROPHILS RELATIVE PERCENT: 84.7 %
NUCLEATED RED BLOOD CELLS: 0 /100{WBCs} (ref <=4)
PLATELET COUNT: 239 10*9/L (ref 150–450)
RED BLOOD CELL COUNT: 3.85 10*12/L — ABNORMAL LOW (ref 4.32–5.72)
RED CELL DISTRIBUTION WIDTH: 19.7 % — ABNORMAL HIGH (ref 12.0–15.0)
WBC ADJUSTED: 5.3 10*9/L (ref 3.5–10.5)

## 2020-04-26 LAB — MAGNESIUM: Magnesium:MCnc:Pt:Ser/Plas:Qn:: 1.6

## 2020-04-26 LAB — RENAL FUNCTION PANEL
ALBUMIN: 4.1 g/dL (ref 3.4–5.0)
ANION GAP: 6 mmol/L (ref 5–14)
BLOOD UREA NITROGEN: 29 mg/dL — ABNORMAL HIGH (ref 9–23)
BUN / CREAT RATIO: 17
CALCIUM: 10.7 mg/dL — ABNORMAL HIGH (ref 8.7–10.4)
CHLORIDE: 116 mmol/L — ABNORMAL HIGH (ref 98–107)
CO2: 21.5 mmol/L (ref 20.0–31.0)
CREATININE: 1.69 mg/dL — ABNORMAL HIGH
EGFR CKD-EPI AA MALE: 48 mL/min/{1.73_m2} — ABNORMAL LOW (ref >=60)
EGFR CKD-EPI NON-AA MALE: 41 mL/min/{1.73_m2} — ABNORMAL LOW (ref >=60)
GLUCOSE RANDOM: 111 mg/dL (ref 70–179)
POTASSIUM: 5.2 mmol/L — ABNORMAL HIGH (ref 3.4–4.5)
SODIUM: 143 mmol/L (ref 135–145)

## 2020-04-26 LAB — SODIUM: Sodium:SCnc:Pt:Ser/Plas:Qn:: 143

## 2020-04-26 LAB — LYMPHOCYTES RELATIVE PERCENT: Lymphocytes/100 leukocytes:NFr:Pt:Bld:Qn:Automated count: 6.2

## 2020-04-26 LAB — SLIDE REVIEW

## 2020-04-26 LAB — SMEAR REVIEW

## 2020-04-27 LAB — BK VIRUS QUANTITATIVE PCR, BLOOD

## 2020-04-27 LAB — BK BLOOD LOG(10): Lab: 0

## 2020-05-03 ENCOUNTER — Encounter: Admit: 2020-05-03 | Discharge: 2020-05-04 | Payer: MEDICARE

## 2020-05-03 LAB — RENAL FUNCTION PANEL
ALBUMIN: 4.1 g/dL (ref 3.4–5.0)
ANION GAP: 4 mmol/L — ABNORMAL LOW (ref 5–14)
BLOOD UREA NITROGEN: 29 mg/dL — ABNORMAL HIGH (ref 9–23)
BUN / CREAT RATIO: 17
CALCIUM: 10.3 mg/dL (ref 8.7–10.4)
CHLORIDE: 118 mmol/L — ABNORMAL HIGH (ref 98–107)
CO2: 21.3 mmol/L (ref 20.0–31.0)
CREATININE: 1.69 mg/dL — ABNORMAL HIGH
EGFR CKD-EPI AA MALE: 48 mL/min/{1.73_m2} — ABNORMAL LOW (ref >=60)
GLUCOSE RANDOM: 119 mg/dL (ref 70–179)
PHOSPHORUS: 3.8 mg/dL (ref 2.4–5.1)
POTASSIUM: 4.9 mmol/L — ABNORMAL HIGH (ref 3.4–4.5)
SODIUM: 143 mmol/L (ref 135–145)

## 2020-05-03 LAB — CBC W/ AUTO DIFF
BASOPHILS ABSOLUTE COUNT: 0 10*9/L (ref 0.0–0.1)
BASOPHILS RELATIVE PERCENT: 0 %
EOSINOPHILS ABSOLUTE COUNT: 0.2 10*9/L (ref 0.0–0.7)
EOSINOPHILS RELATIVE PERCENT: 5.2 %
HEMATOCRIT: 36.8 % — ABNORMAL LOW (ref 38.0–50.0)
LYMPHOCYTES ABSOLUTE COUNT: 0.6 10*9/L — ABNORMAL LOW (ref 0.7–4.0)
MEAN CORPUSCULAR HEMOGLOBIN: 31.9 pg (ref 26.0–34.0)
MEAN CORPUSCULAR VOLUME: 96 fL — ABNORMAL HIGH (ref 81.0–95.0)
MEAN PLATELET VOLUME: 7.3 fL (ref 7.0–10.0)
MONOCYTES ABSOLUTE COUNT: 0.2 10*9/L (ref 0.1–1.0)
MONOCYTES RELATIVE PERCENT: 5 %
NEUTROPHILS ABSOLUTE COUNT: 3.1 10*9/L (ref 1.7–7.7)
NEUTROPHILS RELATIVE PERCENT: 75 %
NUCLEATED RED BLOOD CELLS: 0 /100{WBCs} (ref <=4)
PLATELET COUNT: 229 10*9/L (ref 150–450)
RED BLOOD CELL COUNT: 3.84 10*12/L — ABNORMAL LOW (ref 4.32–5.72)
RED CELL DISTRIBUTION WIDTH: 19.4 % — ABNORMAL HIGH (ref 12.0–15.0)
WBC ADJUSTED: 4.1 10*9/L (ref 3.5–10.5)

## 2020-05-03 LAB — CHLORIDE: Chloride:SCnc:Pt:Ser/Plas:Qn:: 118 — ABNORMAL HIGH

## 2020-05-03 LAB — SCHISTOCYTES

## 2020-05-03 LAB — MAGNESIUM: Magnesium:MCnc:Pt:Ser/Plas:Qn:: 1.6

## 2020-05-03 LAB — SLIDE REVIEW

## 2020-05-03 LAB — TACROLIMUS BLOOD: Lab: 11.9

## 2020-05-03 LAB — RED BLOOD CELL COUNT: Lab: 3.84 — ABNORMAL LOW

## 2020-05-05 DIAGNOSIS — Z940 Kidney transplant status: Principal | ICD-10-CM

## 2020-05-05 LAB — CMV DNA, QUANTITATIVE, PCR
CMV QUANT LOG10: 2.44 {Log_IU}/mL — ABNORMAL HIGH (ref <0.00)
CMV QUANT: 277 [IU]/mL — ABNORMAL HIGH (ref <0)

## 2020-05-05 MED ORDER — TACROLIMUS XR 1 MG TABLET,EXTENDED RELEASE 24 HR
ORAL_TABLET | 11 refills | 0 days
Start: 2020-05-05 — End: ?

## 2020-05-05 MED ORDER — ENVARSUS XR 4 MG TABLET,EXTENDED RELEASE
ORAL_TABLET | Freq: Every day | ORAL | 11 refills | 30.00000 days | Status: CP
Start: 2020-05-05 — End: ?

## 2020-05-05 NOTE — Unmapped (Signed)
Per Dr Nestor Lewandowsky decrease envarsus to 8mg  daily    Called and updated patient via detailed VM. Asked him to call back to confirm dose change

## 2020-05-05 NOTE — Unmapped (Signed)
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    Upon physical exam there is an area of hypopigmentation in cannulation zone with upper aspect slightly distended. Discussed process of ligation surgery, risks and benefits. He elects to defer scheduling at this time.     He can reach back out to vascular access team after 1 year anniversary to see if he would like to proceed with surgery.     I personally spent 20 minutes face-to-face and non-face-to-face in the care of this patient, which includes all pre, intra, and post visit time on the date of service. Greater than 50% of the time was spent on counseling and the substance of the discussion.    Subjective  Bengie Marylee Floras is a 69 y.o. 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 He had an uncomplicated postoperative course. Denies any steal symptoms or perfusional abnormalities. Kidney functioning very well.      Allergies  Patient has no known allergies.    Medications    Current Outpatient Medications   Medication Sig Dispense Refill   ??? acetaminophen (TYLENOL) 500 MG tablet Take 1-2 tablets (500-1,000 mg total) by mouth every six (6) hours as needed for pain or fever (> 38C). 100 tablet 0   ??? allopurinoL (ZYLOPRIM) 100 MG tablet Take 1 tablet (100 mg total) by mouth every evening. 30 tablet 11   ??? amLODIPine (NORVASC) 10 MG tablet Take 1 tablet (10 mg total) by mouth daily. 30 tablet 11   ??? aspirin (ECOTRIN) 81 MG tablet Take 1 tablet (81 mg total) by mouth daily. 30 tablet 11   ??? atorvastatin (LIPITOR) 40 MG tablet Take 1 tablet (40 mg total) by mouth daily. (Patient taking differently: Take 40 mg by mouth nightly. ) 90 tablet 3   ??? carvediloL (COREG) 6.25 MG tablet Take 2 tablets (12.5 mg total) by mouth Two (2) times a day. 360 tablet 3   ??? cholecalciferol, vitamin D3-125 mcg, 5,000 unit,, 125 mcg (5,000 unit) tablet Take 1 tablet (125 mcg total) by mouth daily.     ??? ENVARSUS XR 4 mg Tb24 extended release tablet Take 2 tablets (8 mg total) by mouth daily. 60 tablet 11   ??? filgrastim-sndz (ZARXIO) 480 mcg/0.8 mL Syrg Inject the content of one syringe (480 mcg total) under the skin once a week. Only take at direction of transplant team 3.2 mL 0   ??? magnesium oxide-Mg AA chelate (MAGNESIUM, AMINO ACID CHELATE,) 133 mg Tab Take 1 tablet by mouth two (2) times a day. 100 tablet 11   ??? mycophenolate (MYFORTIC) 180 MG EC tablet Take 1 tablet (180 mg total) by mouth Two (2) times a day. 60 tablet 11   ??? omeprazole (PRILOSEC) 40 MG capsule Take 1 capsule (40 mg total) by mouth every other day. 45 capsule 3   ??? tacrolimus (ENVARSUS XR) 1 mg Tb24 extended release tablet HOLD 30 tablet 11   ??? valGANciclovir (VALCYTE) 450 mg tablet Take 1 tablet (450 mg total) by mouth daily. 30 tablet 1     No current facility-administered medications for this visit.       Past Medical History  Past Medical History:   Diagnosis Date   ??? Anemia    ??? Cancer (CMS-HCC)  clear cell adenocarcinoma of left kidney   ??? Diabetes mellitus (CMS-HCC)    ??? Diabetic nephropathy (CMS-HCC)    ??? ESRD (end stage renal disease) on dialysis (CMS-HCC)    ??? ESRD on dialysis (CMS-HCC)    ??? Gout    ??? Hyperlipidemia    ??? Hypertension        Past Surgical History  Past Surgical History:   Procedure Laterality Date   ??? AV FISTULA PLACEMENT     ??? PR COLONOSCOPY FLX DX W/COLLJ SPEC WHEN PFRMD N/A 04/30/2018    Procedure: COLONOSCOPY, FLEXIBLE, PROXIMAL TO SPLENIC FLEXURE; DIAGNOSTIC, W/WO COLLECTION SPECIMEN BY BRUSH OR WASH;  Surgeon: Luanne Bras, MD;  Location: [facility];  Service: Gastroenterology   ??? PR COLONOSCOPY W/BIOPSY SINGLE/MULTIPLE N/A 04/30/2018    Procedure: COLONOSCOPY, FLEXIBLE, PROXIMAL TO SPLENIC FLEXURE; WITH BIOPSY, SINGLE OR MULTIPLE;  Surgeon: Luanne Bras, MD;  Location: [facility];  Service: Gastroenterology   ??? PR COLSC FLX W/RMVL OF TUMOR POLYP LESION SNARE TQ N/A 04/30/2018    Procedure: COLONOSCOPY FLEX; W/REMOV TUMOR/LES BY SNARE;  Surgeon: Luanne Bras, MD;  Location: [facility];  Service: Gastroenterology   ??? PR EXPLORATORY OF ABDOMEN Midline 04/14/2016    Procedure: EXPLORATORY LAPAROTOMY, EXPLORATORY CELIOTOMY WITH OR WITHOUT BIOPSY(S);  Surgeon: Leona Carry, MD;  Location: MAIN OR [facility];  Service: Transplant   ??? PR EXPLORATORY OF ABDOMEN N/A 04/16/2016    Procedure: EXPLORATORY LAPAROTOMY, EXPLORATORY CELIOTOMY WITH OR WITHOUT BIOPSY(S);  Surgeon: Leona Carry, MD;  Location: MAIN OR [facility];  Service: Transplant   ??? PR LAP, RADICAL NEPHRECTOMY Right 12/27/2016    Procedure: Robotic Xi Laparoscopy; Radical Nephrectomy (Incl Remove Gerota`S Fascia, Fatty Tissue, Reg Lymph Node, Adrenalectomy);  Surgeon: Tomie China, MD;  Location: MAIN OR [facility];  Service: Urology   ??? PR NEGATIVE PRESSURE WOUND THERAPY DME >50 SQ CM N/A 04/12/2016    Procedure: NEG PRESS WOUND TX (VAC ASSIST) INCL TOPICALS, PER SESSION, TSA GREATER THAN/= 50 CM SQUARED;  Surgeon: Leona Carry, MD;  Location: MAIN OR [facility];  Service: Transplant   ??? PR NEGATIVE PRESSURE WOUND THERAPY DME >50 SQ CM Bilateral 04/14/2016    Procedure: NEG PRESS WOUND TX (VAC ASSIST) INCL TOPICALS, PER SESSION, TSA GREATER THAN/= 50 CM SQUARED;  Surgeon: Leona Carry, MD;  Location: MAIN OR [facility];  Service: Transplant   ??? PR NEGATIVE PRESSURE WOUND THERAPY DME >50 SQ CM N/A 04/16/2016    Procedure: NEG PRESS WOUND TX (VAC ASSIST) INCL TOPICALS, PER SESSION, TSA GREATER THAN/= 50 CM SQUARED;  Surgeon: Leona Carry, MD;  Location: MAIN OR [facility];  Service: Transplant   ??? PR NEPHRECTOMY, W/PART. URETECTOMY Bilateral 04/11/2016 Procedure: LAPAROSCOPY, SURGICAL, NEPHRECTOMY WITH TOTAL URETERECTOMY;  Surgeon: Leona Carry, MD;  Location: MAIN OR [facility];  Service: Transplant   ??? PR REMV KIDNEY,W/RIB RESECTION Bilateral 04/11/2016    Procedure: NEPHRECTOMY, INCLUDING PARTIAL URETERECTOMY, ANY OPEN APPROACH INCLUDING RIB RESECTION;  Surgeon: Leona Carry, MD;  Location: MAIN OR [facility];  Service: Transplant   ??? PR TRANSPLANT,PREP LIVING  RENAL GRAFT N/A 06/22/2019    Procedure: [facility] STD PREP LIVING DONOR RENAL ALLGRFT (OPEN/LAPROSC) PRIOR TO TRANSPLANT, INC DISSECT/REM AS NECESS;  Surgeon: Leona Carry, MD;  Location: MAIN OR [facility];  Service: Transplant   ??? PR TRANSPLANTATION OF KIDNEY N/A 06/22/2019    Procedure: RENAL ALLOTRANSPLANTATION, IMPLANTATION OF GRAFT; WITHOUT RECIPIENT NEPHRECTOMY;  Surgeon: Lyn Hollingshead  Lorayne Marek, MD;  Location: MAIN OR [facility];  Service: Transplant   ??? SPLENECTOMY         Review of Systems  A 12 system review of systems was negative except as noted in HPI    Objective  Blood pressure 163/76, pulse 51, temperature 36 ??C (96.8 ??F), temperature source Tympanic, height 170.2 cm (5' 7), weight 72.9 kg (160 lb 12.8 oz). Body mass index is 25.18 kg/m??.    Lungs: clear to auscultation, percussion to the bases, and unlabored breathing   Heart: euvolemic, regular rate and rhythm, normal S1 and S2, no murmur  Ext: no edema, well perfused l arm fistula  Slightly distended to upper aspect of fistula. +thrill +bruit, some hypopigmentation but no acute vascular compromise.   Neuro: grossly intact    Test Results  Lab Results   Component Value Date    WBC 4.1 05/03/2020    HGB 12.2 (L) 05/03/2020    HCT 36.8 (L) 05/03/2020    PLT 229 05/03/2020     Lab Results   Component Value Date    NA 143 05/03/2020    K 4.9 (H) 05/03/2020    CL 118 (H) 05/03/2020    CO2 21.3 05/03/2020    BUN 29 (H) 05/03/2020    CREATININE 1.69 (H) 05/03/2020    CALCIUM 10.3 05/03/2020    MG 1.6 05/03/2020    PHOS 3.8 05/03/2020       Imaging/Vein Mapping:    PVL fistula duplex 05/06/2020  Findings:  +-----------------------------------------+--------+------+--------+-----------+  -Radial-Cephalic AVF                      -Diameter-Depth -PSV     -Flow Volume-  +-----------------------------------------+--------+------+--------+-----------+  -Distal Radial Artery                     -6.6 mm  -4.7 mm-142 cm/s-371 ml/min -  +-----------------------------------------+--------+------+--------+-----------+  -AVF anastomosis                          -4.7 mm  -7.5 mm-367 cm/s-           -  +-----------------------------------------+--------+------+--------+-----------+  -2 cm proximal to anastomosis             -11.3 mm -2.6 mm-201 cm/s-1578 ml/min-  -(outflow vein)                           -        -      -        -           -  +-----------------------------------------+--------+------+--------+-----------+  -4 cm proximal to anastomosis             -15.3 mm -3.1 mm-        -           -  -(outflow vein)                           -        -      -        -           -  +-----------------------------------------+--------+------+--------+-----------+  -8 cm proximal to anastomosis (outflow    -21.0 mm -2.2 mm-        -           -  -  vein)                                    -        -      -        -           -  +-----------------------------------------+--------+------+--------+-----------+    _______________________________________    Gertie Fey, DNP, APRN, FNP-C  [facility]  [address]  [place], [place]  [address]

## 2020-05-06 ENCOUNTER — Ambulatory Visit: Admit: 2020-05-06 | Discharge: 2020-05-07 | Payer: MEDICARE

## 2020-05-06 NOTE — Unmapped (Signed)
New Envarsus XR 4 mg tablet Prescription   Test Claim; $0.00 Medicare Part B    Last filled 03/15/2020 for 30 days - Next RC call set for 05/18/2020

## 2020-05-06 NOTE — Unmapped (Signed)
[facility] Refill Coordination Note    Specialty Medication(s) to be Shipped:   Transplant: Envarsus 4mg  and valgancyclovir 450mg     Other medication(s) to be shipped: allopurinol 100mg , atorvastatin 40mg , amlodipine 10mg      **Patient denied refills for Mycophenolate at this timeFedEx, DOB: 09-Jun-1951  Phone: [phone] (home)       All above HIPAA information was verified with patient.     Was a Nurse, learning disability used for this call? No    Completed refill call assessment today to schedule patient's medication shipment from the [facility] [phone]).       Specialty medication(s) and dose(s) confirmed: Regimen is correct and unchanged.   Changes to medications: Koby reports no changes at this time.  Changes to insurance: No  Questions for the pharmacist: No    Confirmed patient received Welcome Packet with first shipment. The patient will receive a drug information handout for each medication shipped and additional FDA Medication Guides as required.       DISEASE/MEDICATION-SPECIFIC INFORMATION        N/A    SPECIALTY MEDICATION ADHERENCE     Medication Adherence    Patient reported X missed doses in the last month: 0  Specialty Medication: Envarsus 4mg   Patient is on additional specialty medications: Yes  Additional Specialty Medications: Valganciclovir 450mg   Patient Reported Additional Medication X Missed Doses in the Last Month: 0  Patient is on more than two specialty medications: No  Informant: patient                Envarsus 4 mg: 4 days of medicine on hand   Valganciclovir 450 mg: 12 days of medicine on hand         SHIPPING     Shipping address confirmed in Epic.     Delivery Scheduled: Yes, Expected medication delivery date: 05/10/20.     Medication will be delivered via Same Day Courier to the prescription address in Epic W[facility].    Jasper Loser   [facility] Specialty Technician

## 2020-05-10 ENCOUNTER — Encounter: Admit: 2020-05-10 | Discharge: 2020-05-11 | Payer: MEDICARE

## 2020-05-10 LAB — CBC W/ AUTO DIFF
BASOPHILS ABSOLUTE COUNT: 0 10*9/L (ref 0.0–0.1)
EOSINOPHILS ABSOLUTE COUNT: 0.2 10*9/L (ref 0.0–0.7)
EOSINOPHILS RELATIVE PERCENT: 3.3 %
HEMATOCRIT: 36.3 % — ABNORMAL LOW (ref 38.0–50.0)
LYMPHOCYTES ABSOLUTE COUNT: 0.7 10*9/L (ref 0.7–4.0)
MEAN CORPUSCULAR HEMOGLOBIN CONC: 33.4 g/dL (ref 30.0–36.0)
MEAN CORPUSCULAR HEMOGLOBIN: 32.1 pg (ref 26.0–34.0)
MEAN CORPUSCULAR VOLUME: 96.2 fL — ABNORMAL HIGH (ref 81.0–95.0)
MEAN PLATELET VOLUME: 8.5 fL (ref 7.0–10.0)
MONOCYTES ABSOLUTE COUNT: 0.2 10*9/L (ref 0.1–1.0)
MONOCYTES RELATIVE PERCENT: 5.2 %
NEUTROPHILS ABSOLUTE COUNT: 3.6 10*9/L (ref 1.7–7.7)
NEUTROPHILS RELATIVE PERCENT: 77.4 %
NUCLEATED RED BLOOD CELLS: 0 /100{WBCs} (ref <=4)
PLATELET COUNT: 202 10*9/L (ref 150–450)
RED BLOOD CELL COUNT: 3.77 10*12/L — ABNORMAL LOW (ref 4.32–5.72)
RED CELL DISTRIBUTION WIDTH: 18.8 % — ABNORMAL HIGH (ref 12.0–15.0)
WBC ADJUSTED: 4.6 10*9/L (ref 3.5–10.5)

## 2020-05-10 LAB — PHOSPHORUS: Phosphate:MCnc:Pt:Ser/Plas:Qn:: 4.1

## 2020-05-10 LAB — RENAL FUNCTION PANEL
ALBUMIN: 4 g/dL (ref 3.4–5.0)
ANION GAP: 5 mmol/L (ref 5–14)
BLOOD UREA NITROGEN: 26 mg/dL — ABNORMAL HIGH (ref 9–23)
BUN / CREAT RATIO: 17
CALCIUM: 10.4 mg/dL (ref 8.7–10.4)
CO2: 22 mmol/L (ref 20.0–31.0)
CREATININE: 1.56 mg/dL — ABNORMAL HIGH
EGFR CKD-EPI AA MALE: 52 mL/min/{1.73_m2} — ABNORMAL LOW (ref >=60)
GLUCOSE RANDOM: 116 mg/dL (ref 70–179)
PHOSPHORUS: 4.1 mg/dL (ref 2.4–5.1)
POTASSIUM: 4.7 mmol/L — ABNORMAL HIGH (ref 3.4–4.5)
SODIUM: 142 mmol/L (ref 135–145)

## 2020-05-10 LAB — MAGNESIUM: Magnesium:MCnc:Pt:Ser/Plas:Qn:: 1.5 — ABNORMAL LOW

## 2020-05-10 LAB — TACROLIMUS BLOOD: Lab: 6.3

## 2020-05-10 LAB — MEAN CORPUSCULAR VOLUME: Erythrocyte mean corpuscular volume:EntVol:Pt:RBC:Qn:Automated count: 96.2 — ABNORMAL HIGH

## 2020-05-10 MED FILL — AMLODIPINE 10 MG TABLET: ORAL | 30 days supply | Qty: 30 | Fill #9

## 2020-05-10 MED FILL — ATORVASTATIN 40 MG TABLET: 90 days supply | Qty: 90 | Fill #3 | Status: AC

## 2020-05-10 MED FILL — ALLOPURINOL 100 MG TABLET: 30 days supply | Qty: 30 | Fill #6 | Status: AC

## 2020-05-10 MED FILL — ATORVASTATIN 40 MG TABLET: ORAL | 90 days supply | Qty: 90 | Fill #3

## 2020-05-10 MED FILL — VALGANCICLOVIR 450 MG TABLET: 30 days supply | Qty: 30 | Fill #0 | Status: AC

## 2020-05-10 MED FILL — ALLOPURINOL 100 MG TABLET: ORAL | 30 days supply | Qty: 30 | Fill #6

## 2020-05-10 MED FILL — ENVARSUS XR 4 MG TABLET,EXTENDED RELEASE: 30 days supply | Qty: 60 | Fill #0 | Status: AC

## 2020-05-10 MED FILL — AMLODIPINE 10 MG TABLET: 30 days supply | Qty: 30 | Fill #9 | Status: AC

## 2020-05-10 NOTE — Unmapped (Signed)
Pt. Called to confirm that he will go ahead with the suggested surgery with Dr. Norma Fredrickson   He is available Nov 18 going forward through Thanks-giving.     Sent note to coordinator.

## 2020-05-11 DIAGNOSIS — B259 Cytomegaloviral disease, unspecified: Principal | ICD-10-CM

## 2020-05-11 DIAGNOSIS — Z940 Kidney transplant status: Principal | ICD-10-CM

## 2020-05-11 DIAGNOSIS — E559 Vitamin D deficiency, unspecified: Principal | ICD-10-CM

## 2020-05-11 DIAGNOSIS — Z79899 Other long term (current) drug therapy: Principal | ICD-10-CM

## 2020-05-11 DIAGNOSIS — D849 Immunodeficiency, unspecified: Principal | ICD-10-CM

## 2020-05-11 LAB — CMV DNA, QUANTITATIVE, PCR: CMV QUANT: 331 [IU]/mL — ABNORMAL HIGH (ref <0)

## 2020-05-11 LAB — CMV VIRAL LD: Lab: 0

## 2020-05-12 NOTE — Unmapped (Signed)
Dialysis Access Coordinator Note:  Frank Leach, DOB: [birth year]  Contact: phone, does not appear to use mychart  Challenges: -  COVID-19 status: vaccinated    Issue/Concern:  Referral from TNC to assess vascular access. Kidney txp 06/22/2019.     Current Dialysis Access:   Left lower arm AV fistula    Previous dialysis access history:   unknown    Vessel Mapping/Studies:     NOTES:   05/12/2020  Pt was seen and evaluated in clinic on 05/06/20, s/p kidney txp 06/2019. Offered revision of L AVF pseudoaneurysmal area.   Pt has contacted TNC and let her know he would like to proceed with revision. Discussed with patient and he would like to wait till after one-year post kidney appointments and would like to wait till after the holidays. He has my contact information and I set reminder for January 2022 to reach out to patient.     03/29/2020  Rescheduling appt for 04/01/20. Called and s/w patient and he describes left lower arm AV fistula with a knot at the end of it He feels it is stable with no recent changes. Told him 04/01/2020 is canceled. He will call his daughter who drives him.   Ordered duplex and in clinic visit same day on or after 04/29/2020 sent.  Pt did not want to take my name or contact information, I have so many numbers.  Update: duplex order placed 03/30/20 1508  Resent request to reschedule.    03/04/2020  Referred by T[facility] to assess access.   Request to schedule in clinic visit on or about 04/01/2020 sent.    PMH: Kidney txp 06/22/2019; left kidney cancer; DM; gout; HTN; hyperlipidemia  PSH: kidney txp 2020; splenectomy  Labs on 03/01/2020: sCr 1.6; GFR 44 post txp  Anticoagulant/Antiplatelet medications: aspirin 81 mg daily; Indications: -; This medication status was reviewed on: 03/04/2020 via chart review    Nephrologist: [place]    Dialysis Center:  n/a  Dialysis Days:   n/a    Pt lives in: [place], [place]    Frank Leach, [place]  Dialysis Access Coordinator for Dr. Zara Chess of [facility]  [address], [place], [place] [address]  Desk: [phone]  Fax: [phone]

## 2020-05-13 DIAGNOSIS — Z79899 Other long term (current) drug therapy: Principal | ICD-10-CM

## 2020-05-13 DIAGNOSIS — Z940 Kidney transplant status: Principal | ICD-10-CM

## 2020-05-13 MED ORDER — VALGANCICLOVIR 450 MG TABLET
ORAL_TABLET | Freq: Two times a day (BID) | ORAL | 11 refills | 30.00000 days | Status: CP
Start: 2020-05-13 — End: ?

## 2020-05-13 NOTE — Unmapped (Signed)
After discussing increasing CMV levels with . Dr. Nestor Lewandowsky, patient Valcyte increased to 450mg  BID     Provided dose change to his wife, she verbalizes understanding, had a recent deliverey and pt. Will start tonight. 10/7     confirmed next weeks appt  No other concerns or questions at this time.

## 2020-05-14 NOTE — Unmapped (Signed)
Clinical Assessment Needed For: Dose Change  Medication: valganciclovir  Last Fill Date/Day Supply: 05/10/20 / 30  Refill Too Soon until 10/15  Was previous dose already scheduled to fill: No    Notes to Pharmacist: None

## 2020-05-17 ENCOUNTER — Encounter: Admit: 2020-05-17 | Discharge: 2020-05-18 | Payer: MEDICARE

## 2020-05-17 LAB — LIPID PANEL
CHOLESTEROL/HDL RATIO SCREEN: 2.5 (ref 1.0–4.5)
CHOLESTEROL: 81 mg/dL (ref <=200)
HDL CHOLESTEROL: 33 mg/dL — ABNORMAL LOW (ref 40–60)
LDL CHOLESTEROL CALCULATED: 37 mg/dL — ABNORMAL LOW (ref 40–99)
NON-HDL CHOLESTEROL: 48 mg/dL — ABNORMAL LOW (ref 70–130)

## 2020-05-17 LAB — FASTING

## 2020-05-17 LAB — PARATHYROID HORMONE INTACT: Parathyrin.intact:MCnc:Pt:Ser/Plas:Qn:: 126.7 — ABNORMAL HIGH

## 2020-05-19 ENCOUNTER — Encounter: Admit: 2020-05-19 | Discharge: 2020-05-19 | Payer: MEDICARE | Attending: Nephrology | Primary: Nephrology

## 2020-05-19 ENCOUNTER — Ambulatory Visit: Admit: 2020-05-19 | Discharge: 2020-05-19 | Payer: MEDICARE

## 2020-05-19 DIAGNOSIS — B259 Cytomegaloviral disease, unspecified: Principal | ICD-10-CM

## 2020-05-19 DIAGNOSIS — E559 Vitamin D deficiency, unspecified: Principal | ICD-10-CM

## 2020-05-19 DIAGNOSIS — Z79899 Other long term (current) drug therapy: Principal | ICD-10-CM

## 2020-05-19 DIAGNOSIS — I313 Pericardial effusion (noninflammatory): Principal | ICD-10-CM

## 2020-05-19 DIAGNOSIS — Z940 Kidney transplant status: Principal | ICD-10-CM

## 2020-05-19 DIAGNOSIS — D849 Immunodeficiency, unspecified: Principal | ICD-10-CM

## 2020-05-19 LAB — BILIRUBIN UA: Bilirubin:PrThr:Pt:Urine:Ord:Test strip: NEGATIVE

## 2020-05-19 LAB — URINALYSIS
BACTERIA: NONE SEEN /HPF
BILIRUBIN UA: NEGATIVE
BLOOD UA: NEGATIVE
GLUCOSE UA: NEGATIVE
KETONES UA: NEGATIVE
PH UA: 5.5 (ref 5.0–9.0)
PROTEIN UA: NEGATIVE
RBC UA: 2 /HPF (ref <3)
SPECIFIC GRAVITY UA: 1.02 (ref 1.005–1.030)
SQUAMOUS EPITHELIAL: <1 /HPF (ref 0–5)
UROBILINOGEN UA: 0.2
WBC UA: <1 /HPF (ref <2)

## 2020-05-19 LAB — CBC W/ AUTO DIFF
BASOPHILS ABSOLUTE COUNT: 0.1 10*9/L (ref 0.0–0.1)
EOSINOPHILS ABSOLUTE COUNT: 0.2 10*9/L (ref 0.0–0.7)
EOSINOPHILS RELATIVE PERCENT: 3.9 %
HEMATOCRIT: 40.1 % (ref 38.0–50.0)
LYMPHOCYTES ABSOLUTE COUNT: 0.4 10*9/L — ABNORMAL LOW (ref 0.7–4.0)
LYMPHOCYTES RELATIVE PERCENT: 6.9 %
MEAN CORPUSCULAR HEMOGLOBIN CONC: 31.9 g/dL (ref 30.0–36.0)
MEAN CORPUSCULAR HEMOGLOBIN: 30.9 pg (ref 26.0–34.0)
MEAN CORPUSCULAR VOLUME: 96.7 fL — ABNORMAL HIGH (ref 81.0–95.0)
MEAN PLATELET VOLUME: 8.1 fL (ref 7.0–10.0)
MONOCYTES ABSOLUTE COUNT: 0.1 10*9/L (ref 0.1–1.0)
MONOCYTES RELATIVE PERCENT: 2.4 %
NEUTROPHILS ABSOLUTE COUNT: 4.6 10*9/L (ref 1.7–7.7)
NEUTROPHILS RELATIVE PERCENT: 84.5 %
NUCLEATED RED BLOOD CELLS: 0 /100{WBCs} (ref <=4)
PLATELET COUNT: 180 10*9/L (ref 150–450)
RED BLOOD CELL COUNT: 4.15 10*12/L — ABNORMAL LOW (ref 4.32–5.72)
RED CELL DISTRIBUTION WIDTH: 17.9 % — ABNORMAL HIGH (ref 12.0–15.0)
WBC ADJUSTED: 5.5 10*9/L (ref 3.5–10.5)

## 2020-05-19 LAB — RENAL FUNCTION PANEL
ALBUMIN: 4.2 g/dL (ref 3.4–5.0)
ANION GAP: 2 mmol/L — ABNORMAL LOW (ref 5–14)
BUN / CREAT RATIO: 15
CALCIUM: 11.2 mg/dL — ABNORMAL HIGH (ref 8.7–10.4)
CHLORIDE: 114 mmol/L — ABNORMAL HIGH (ref 98–107)
CO2: 25.5 mmol/L (ref 20.0–31.0)
EGFR CKD-EPI AA MALE: 47 mL/min/{1.73_m2} — ABNORMAL LOW (ref >=60)
EGFR CKD-EPI NON-AA MALE: 41 mL/min/{1.73_m2} — ABNORMAL LOW (ref >=60)
GLUCOSE RANDOM: 115 mg/dL (ref 70–179)
PHOSPHORUS: 4 mg/dL (ref 2.4–5.1)
POTASSIUM: 5.8 mmol/L — ABNORMAL HIGH (ref 3.4–4.5)
SODIUM: 141 mmol/L (ref 135–145)

## 2020-05-19 LAB — POTASSIUM: Potassium:SCnc:Pt:Ser/Plas:Qn:: 5.8 — ABNORMAL HIGH

## 2020-05-19 LAB — SCHISTOCYTES

## 2020-05-19 LAB — PROTEIN/CREAT RATIO, URINE: Protein/Creatinine:MRto:Pt:Urine:Qn:: 0.336

## 2020-05-19 LAB — MAGNESIUM: Magnesium:MCnc:Pt:Ser/Plas:Qn:: 1.7

## 2020-05-19 LAB — MEAN CORPUSCULAR VOLUME: Erythrocyte mean corpuscular volume:EntVol:Pt:RBC:Qn:Automated count: 96.7 — ABNORMAL HIGH

## 2020-05-19 LAB — PROTEIN / CREATININE RATIO, URINE: PROTEIN/CREAT RATIO, URINE: 0.336

## 2020-05-19 LAB — SLIDE REVIEW

## 2020-05-19 NOTE — Unmapped (Unsigned)
One blood pressure taken.

## 2020-05-19 NOTE — Unmapped (Unsigned)
Transplant Nephrology Clinic Visit    Subjective/Interval:   Since patient's last visit in the transplant clinic - patient has been doing well in terms of transplant, taking transplant medications regularly, no episodes of rejection and no side effects of medications.    CT findings from 04/07/20 were overall stable, however, multichamber cardiomegaly with pericardial effusion was new from prior. Patient scheduled for ECHO to follow upon pericardial effusion.     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     He presents today feeling overall well. Tolerating his immunosuppression therapy well. Denies tremors. Denies chest pain or tightness, nausea, vomiting, diarrhea, arthralgia, myalgia, cough, fatigue, gout, lower extremity swelling, or dysuria. BP well-controlled at home; did not take BP medications this morning. Overall no changes.     Last dose of Envarsus at 9 am yesterday.     Assessment:  69 y.o. 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       Recommendations/Plan:     Allograft Function: DDKT KDPI: 42%06/22/2019  Renal function holding relatively stable with electrolytes and acid base balanced.     Baseline Cr: ~ 1.57  Last Cr:  1.70 Date:  05/19/20   Decoy/ BK     neg Date:  04/26/20   DSA     neg Date:  01/19/20  CMV:     331 Date:  05/10/20  UPC:     0.383 Date:  04/07/20.     Patient asked to continue with strict k restriction due to hyperkalemia     Immunosuppression Management [High Risk Medical Decision Making For Drug Therapy Requiring Intensive Monitoring For Toxicity]:     Tacro target: 7-9  Tacro last lvl /date: 6.4 (05/10/20).   MMF/MPA dose: 180 mg BID  Side effects: none    Blood Pressure Management: Home BPs have been well-controlled. Continue current BP regimen. Will change carvedilol from 6.25 mg BID to 12.5 mg daily at next visit once patient runs out of current refill.     Lipid Management: Last lipid panel on 05/17/2020.  The 10-year ASCVD risk score Denman George DC Jr., et al., 2013) is: 36.3%  Currently taking atorvastatin     Electrolytes: Patient asked to continue drinking more water, labs reflect hyperkalemia  - 5.8. Monitor for now. Magnesium in normal range. Most recent 1.7.   Ergocalciferol to be continued.    Infectious Prophylaxis and Monitoring: CMV VL 331 (05/10/20). Valcyte increased to 450 mg BID on 05/13/20. Pending today's CMV level. Decoy cells not detected 01/15/20. BK VL not detected 04/26/20.  - Once CMV VL becomes undetected, will discontinue Valcyte. Anticipate WBC will improve once off the medication.    Gout: No flares recently. Continue on Allopurinol.     RCC s/p bilateral nephrectomy:   CT RMP 08/29/2018 showed no suspicious enhancing lesions.   Repeat CT findings from 04/07/20 were overall stable; however, multichamber cardiomegaly with pericardial effusion was new from prior. Patient scheduled for ECHO to follow upon pericardial effusion.     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 Will monitor home BG and consider restarting at next visit if needed.     Left Lower Extremity Swelling - Resolved    Health Maintenance:   Colonoscopy:  04/30/2018 - 1 adenomatous polyp; repeat 5-10 years    Immunizations:   Flu Shot: 05/19/2020  Prevnar 13: 10/30/2013  Pneumovax: 04/24/2016, due 2022  No immunizations till 1 year post transplant.  COVID-19 (Moderna): 09/12/19, 10/14/19; booster received 04/07/20    History of Present Illness    69 y.o. male here for follow up after kidney transplantation.  Patient has ESRD secondary to HTN.  Patient has been on dialysis since 01/25/2012.  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 Patient was admitted for kidney transplant on 06/21/2019.  Patient experienced hyperkalemia that despite medical management would not decrease.  2 hours of HD performed.  Patient discharged 06/26/2019.       Transplant History:    Organ Received: Left DDKT, DBD, PHS, KDPI: 42%; 21h cold ischemia  Native Kidney Disease: HTN; cPRA: 0%  Date of Transplant: 06/22/2019  Post-Transplant Course: HD once for high potassium  Prior Transplants: none  Induction: [place]  Date of Ureteral Stent Removal: 07/29/2019  CMV/EBV Status: CMV D+/R+  EBV D+/R+  Rejection Episodes: none  Donor Specific Antibodies: none  Results of Renal Imaging (pre and post):   Pre-Txp 08/29/2018 CT RMP  Sequela of bilateral nephrectomy. Interval decrease in linear soft tissue within the right nephrectomy bed, potentially postsurgical or fat necrosis. Unchanged linear tissue within the left nephrectomy bed. No suspicious enhancing lesions are visualized within the surgical beds    Post-Txp 06/25/2019 (txp kidney only)  The renal transplant was located in the left lower quadrant. Normal size and echogenicity.  No solid masses or calculi. Trace perinephric fluid adjacent to the lower pole of the kidney. Mild pelviectasis  - Perfusion: Using power Doppler, normal perfusion was seen throughout the renal parenchyma.  - Resistive indices in the renal transplant are stable compared with prior examination.  - Main renal artery/iliac artery: Patent. Again noted are 3 renal arteries. Resistive indices within the renal arteries are stable to minimally increased, now at or just above normal limits.  - Main renal vein/iliac vein: Patent    Past Medical History  1. HTN  2. DM II  3. RCC s/p Bilateral nephrectomy (L 04/2016, R 12/2016)  4. GERD  5. HLD  6. 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    Medications  Current Outpatient Medications   Medication Sig Dispense Refill   ??? acetaminophen (TYLENOL) 500 MG tablet Take 1-2 tablets (500-1,000 mg total) by mouth every six (6) hours as needed for pain or fever (> 38C). 100 tablet 0   ??? allopurinoL (ZYLOPRIM) 100 MG tablet Take 1 tablet (100 mg total) by mouth every evening. 30 tablet 11   ??? amLODIPine (NORVASC) 10 MG tablet Take 1 tablet (10 mg total) by mouth daily. 30 tablet 11   ??? aspirin (ECOTRIN) 81 MG tablet Take 1 tablet (81 mg total) by mouth daily. 30 tablet 11   ??? atorvastatin (LIPITOR) 40 MG tablet Take 1 tablet (40 mg total) by mouth daily. (Patient taking differently: Take 40 mg by mouth nightly. ) 90 tablet 3   ??? carvediloL (COREG) 6.25 MG tablet Take 2 tablets (12.5 mg total) by mouth Two (2) times a day. 360 tablet 3   ??? cholecalciferol, vitamin D3-125 mcg, 5,000 unit,, 125 mcg (5,000 unit) tablet Take 1 tablet (125 mcg total) by mouth daily.     ???  ENVARSUS XR 4 mg Tb24 extended release tablet Take 2 tablets (8 mg total) by mouth daily. 60 tablet 11   ??? filgrastim-sndz (ZARXIO) 480 mcg/0.8 mL Syrg Inject the content of one syringe (480 mcg total) under the skin once a week. Only take at direction of transplant team 3.2 mL 0   ??? magnesium oxide-Mg AA chelate (MAGNESIUM, AMINO ACID CHELATE,) 133 mg Tab Take 1 tablet by mouth two (2) times a day. 100 tablet 11   ??? mycophenolate (MYFORTIC) 180 MG EC tablet Take 1 tablet (180 mg total) by mouth Two (2) times a day. 60 tablet 11   ??? omeprazole (PRILOSEC) 40 MG capsule Take 1 capsule (40 mg total) by mouth every other day. 45 capsule 3   ??? tacrolimus (ENVARSUS XR) 1 mg Tb24 extended release tablet HOLD 30 tablet 11   ??? valGANciclovir (VALCYTE) 450 mg tablet Take 1 tablet (450 mg total) by mouth Two (2) times a day. 60 tablet 11     No current facility-administered medications for this visit.     Physical Exam:   BP 150/62 (BP Site: R Arm, BP Position: Sitting, BP Cuff Size: Medium)  - Pulse 50  - Temp 35.7 ??C (96.2 ??F) (Temporal)  - Ht 167.6 cm (5' 6)  - Wt 72.6 kg (160 lb)  - BMI 25.82 kg/m??     Nursing note and vitals reviewed.   Constitutional: Oriented to person, place, and time. Appears well-developed and well-nourished. No distress.   HENT: Wearing face mask.  Head: Normocephalic and atraumatic.   Eyes: Right eye exhibits no discharge. Left eye exhibits no discharge. No scleral icterus.   Neck: Normal range of motion. Neck supple.   Cardiovascular: Normal rate and regular rhythm. Exam reveals no gallop and no friction rub. No murmur heard.   Pulmonary/Chest: Effort normal and breath sounds normal. No respiratory distress.   Abdominal: Soft. Non tender  Musculoskeletal: Normal range of motion. No edema and no tenderness.   Neurological: Alert and oriented to person, place, and time.   Skin: Skin is warm and dry. No rash noted. Not diaphoretic. No erythema. No pallor.   Psychiatric: Normal mood and affect. Behavior is normal. Judgment and thought content normal.       Laboratory Data and Imaging reviewed in EPIC    Follow-Up:  Return to clinic in 2 months. Pending today's labs.   Labs: twice a week  Patient will continue to follow-up with his primary care provider for non-transplant related issues and medication refills. We have ordered transplant specific labs per the center's guidelines to monitor and assess for toxicities from immunosuppressant drug therapy    Scribe's Attestation: Leeroy Bock, MD obtained and performed the history, physical exam and medical decision making elements that were entered into the chart.  Signed by Sharion Balloon, Scribe, on May 19, 2020 at 9:40 AM.    Documentation assistance provided by the Scribe. I was present during the time the encounter was recorded. The information recorded by the Scribe was done at my direction and has been reviewed and validated by me.

## 2020-05-19 NOTE — Unmapped (Signed)
Transplant Coordinator, Clinic Visit   Pt seen today by transplant nephrology for follow up, reviewed medications and symptoms.          05/19/20 0850   BP: 150/62   Pulse: 50   Temp: 35.7 ??C (96.2 ??F)   Weight: 72.6 kg (160 lb)   Height: 167.6 cm (5' 6)   PainSc: 0-No pain     Pt. In clinic with wife appears well     Assessment  BP: BPs at home 128-156/70   BG: wnl, log reviewed in clinic  Headache: none  Hand tremors: none  Numbness/tingling: none  Fevers: none  Chills/sweats: none  Shortness of breath: none  Chest pain or pressure: none  Palpitations: none  Nausea/vomiting: none  Diarrhea/constipation: none  UTI symptoms: none  Swelling: improvement, with compression stockings   Sleep: sleeps ok, gets in some naps   Pain: none    Patient denies any gout flares in the last 4-5 years  No GERD s/s     Good appetite; reports adequate hydration.     Intake: 64 oz of water and flavor powders   Output: normal     Any new medications? none  Immunosuppressant last taken: took Envarsus 9 am yesterday 10-13    Immunization status: flu shot today     Functional Score: 90     Able to carry on normal activity;  Minor signs or symptoms of disease.  Employment status is: retired    I spent a total of 5 minutes with [employer] reviewing medications and symptoms.      Patient to get Echocardiogram, order placed. Will follow up on scheduling.

## 2020-05-19 NOTE — Unmapped (Cosign Needed)
[facility] TRANS[facility] PHARMACY NOTE  05/19/2020   Frank Leach  [id number]    Medication changes today:   1.  Stop aspirin    Education/Adherence tools provided today:  1.provided updated medication list  2. provided additional education on immunosuppression and transplant related medications including reviewing indications of medications, dosing and side effects    Follow up items:  1. goal of understanding indications and dosing of immunosuppression medications   2. CMV VL and when to stop valganciclovir   3. Potassium given persistently hyperkalemic in past. Can consider adding a thiazide or substituting it for a current BP agent to try and control hyperkalemia long-term.   4. WBC/ANC given past episodes of neutropenia requiring Granix    Next visit with pharmacy in PRN  ____________________________________________________________________    Frank Leach is a 69 y.o. male s/p deceased kidney transplant on July 19, 2019 (Kidney) 2/2 HTN and T2DM.     Other PMH significant for diabetes (diet controlled pre transplant), gout, HTN    Seen by pharmacy today for: medication management and pill box fill and adherence education; last seen by pharmacy 2 months ago     CC: Patient has no complaints today.     Vitals:    05/19/20 0858   BP: 150/62   Pulse: 50   Temp: 35.7 ??C (96.2 ??F)     No Known Allergies    All medications reviewed and updated.     Medication list includes revisions made during today???s encounter    Outpatient Encounter Medications as of 05/19/2020   Medication Sig Dispense Refill   ??? acetaminophen (TYLENOL) 500 MG tablet Take 1-2 tablets (500-1,000 mg total) by mouth every six (6) hours as needed for pain or fever (> 38C). 100 tablet 0   ??? allopurinoL (ZYLOPRIM) 100 MG tablet Take 1 tablet (100 mg total) by mouth every evening. 30 tablet 11   ??? amLODIPine (NORVASC) 10 MG tablet Take 1 tablet (10 mg total) by mouth daily. 30 tablet 11   ??? atorvastatin (LIPITOR) 40 MG tablet Take 1 tablet (40 mg total) by mouth daily. (Patient taking differently: Take 40 mg by mouth nightly. ) 90 tablet 3   ??? carvediloL (COREG) 6.25 MG tablet Take 2 tablets (12.5 mg total) by mouth Two (2) times a day. 360 tablet 3   ??? cholecalciferol, vitamin D3-125 mcg, 5,000 unit,, 125 mcg (5,000 unit) tablet Take 1 tablet (125 mcg total) by mouth daily.     ??? ENVARSUS XR 4 mg Tb24 extended release tablet Take 2 tablets (8 mg total) by mouth daily. 60 tablet 11   ??? filgrastim-sndz (ZARXIO) 480 mcg/0.8 mL Syrg Inject the content of one syringe (480 mcg total) under the skin once a week. Only take at direction of transplant team (Patient not taking: Reported on 05/19/2020) 3.2 mL 0   ??? magnesium oxide-Mg AA chelate (MAGNESIUM, AMINO ACID CHELATE,) 133 mg Tab Take 1 tablet by mouth two (2) times a day. 100 tablet 11   ??? mycophenolate (MYFORTIC) 180 MG EC tablet Take 1 tablet (180 mg total) by mouth Two (2) times a day. 60 tablet 11   ??? omeprazole (PRILOSEC) 40 MG capsule Take 1 capsule (40 mg total) by mouth every other day. 45 capsule 3   ??? tacrolimus (ENVARSUS XR) 1 mg Tb24 extended release tablet HOLD (Patient not taking: Reported on 05/19/2020) 30 tablet 11   ??? valGANciclovir (VALCYTE) 450 mg tablet Take 1 tablet (450 mg total) by mouth  Two (2) times a day. 60 tablet 11   ??? [DISCONTINUED] aspirin (ECOTRIN) 81 MG tablet Take 1 tablet (81 mg total) by mouth daily. 30 tablet 11     No facility-administered encounter medications on file as of 05/19/2020.     Induction agent : alemtuzumab    CURRENT IMMUNOSUPPRESSION: Envarsus 8 mg PO qd   prograf/Envarsus/cyclosporine goal: 6-8   myfortic 180mg  PO BID   steroid free     Patient is tolerating immunosuppression well    IMMUNOSUPPRESSION DRUG LEVELS:  Lab Results   Component Value Date    Tacrolimus, Trough 5.2 04/07/2020    Tacrolimus, Trough 5.2 03/10/2020    Tacrolimus, Trough 7.2 01/19/2020    Tacrolimus, Timed 6.3 05/10/2020    Tacrolimus, Timed 11.9 05/03/2020 Tacrolimus, Timed 7.0 04/26/2020     No results found for: CYCLO  No results found for: EVEROLIMUS  No results found for: SIROLIMUS    Envarsus level is accurate 24 hour trough    Graft function: stable  DSA: ntd  Biopsies to date: mild to moderate arteriosclerosis on 0 hour biopsy  UPC: 0.336 on 05/19/20  WBC/ANC:  wnl    Plan: Will maintain current immunosuppression.    OI Prophylaxis:   CMV Status: D+/ R+, moderate risk . CMV treatment: Valcyte 450 mg BID since 6/15, decreased to 450mg  daily due to neutropenia and CMV VLs <200, increased back to 450mg  twice daily on 05/13/20 due to CMV VLs in the ~300s and ANC wnl.   Estimated Creatinine Clearance: 38.1 mL/min (A) (based on SCr of 1.7 mg/dL (H)).  Lab Results   Component Value Date    CMV Quant 331 (H) 05/10/2020    CMV Quant 277 (H) 05/03/2020    CMV Quant 68 (H) 04/13/2020    CMV Quant 91 (H) 04/07/2020    CMV Quant 102 (H) 04/05/2020    CMV Quant 144 (H) 03/29/2020     PCP Prophylaxis: bactrim SS 1 tab MWF x 6 months complete  Thrush: completed in hospital    Plan: Continue valganciclovir 450mg  BID. CMV VL results pending. Continue to monitor CMV VLs to determine when can stop valganciclovir.     CV Prophylaxis: asa 81 mg   The 10-year ASCVD risk score Denman George DC Jr., et al., 2013) is: 40.5%  Statin therapy: Indicated; currently on atorvastatin 40 mg daily  Plan: Given no history of CAD and patient is ~1 year post-transplant, will discontinue aspirin given lack of indication. Continue to monitor     BP: Goal < 140/90. Clinic vitals reported above  Home BP ranges: 130-156/68-80 (most SBPs 130-140s with a few in the 150s). Denies dizziness.   Current meds include: amlodipine 10 mg nightly; carvedilol 12.5 mg BID  Plan: Continue current therapy. Given persistent hyperkalemia, can consider adding a thiazide if further BP control is needed, or can substitute for a current agent. Continue to monitor     Anemia of CKD:  H/H:   Lab Results   Component Value Date    HGB 12.8 (L) 05/19/2020     Lab Results   Component Value Date    HCT 40.1 05/19/2020     Iron panel:  Lab Results   Component Value Date    IRON 92 06/25/2019    TIBC 136.1 (L) 06/25/2019    FERRITIN 1,820.0 (H) 04/20/2016     Lab Results   Component Value Date    Iron Saturation (%) 68 (H) 06/25/2019    Iron Saturation (%)  28 12/25/2011     Prior ESA use: none post txp  Plan: stable. Continue to monitor.     DM:   Lab Results   Component Value Date    A1C 5.4 03/01/2020   . Goal A1c < 7  History of Dm? Yes: T2DM pre transplant that was diet controlled  Established with endocrinologist/PCP for BG managment? No  Currently on: none   (Prior was on metformin and Rybelsus, but Rybelsus was too costly so it was switched to glipizide. All medications stopped August 2021 d/t hypoglycemia)  Home BS log: checks FBG daily 110-130  Diet: did not address  Exercise: did not address  Fluid intake: 64 oz  Plan: Continue off therapy given last HbA1c 5.4 and SMBG within goal of fasting between 80-130 mg/dL. Continue to check fasting SMBG and repeat HbA1c in November 2021.     Electrolytes: Has persistent hyperkalemia. K today 5.8  Last needed Kayexalate for a K of 6.3 in June 2021  Meds currently on: mg plus protein 133 mg BID  Plan: Continue to monitor K to determine if patient needs Kayexalate. Can consider utilizing a thiazide as a BP agent (see above) to help with persistent hyperkalemia. If Thompson Caul is needed, patient may need manufacturer assistance.     GI/BM: pt reports no complaints  Meds currently on: omeprazole 20 mg PRN- has not sued  Plan: Continue to monitor    Pain: pt reports no pain  Meds currently on: APAP PRN (not using)  Plan: Continue to monitor    Bone health:   Vitamin D Level: 52 on 11/27/19. Goal > 30.   Last DEXA results:  none available  Current meds include: cholecalciferol 5000 units daily  Plan: Vitamin D level  within goal. Repeat level pending. Continue to monitor.     Women's/Men's Health:  Frank Leach is a 69 y.o. male. Patient reports no men's/women's health issues  Plan: Continue to monitor    Gout - last gout flare >2 years ago  Meds currently on: allopurinol 100 mg daily  Plan: continue to monitor    Erectile dysfunction  Meds currently on: papaverine/phentolamine/alprostadil penile injection PRN   Plan: Continue to monitor    Pharmacy preference:  [facility]    Adherence: Patient has poor understanding of medications; was not able to independently identify names/doses of immunosuppressants and OI meds.  Patient does not fill their own pill box on a regular basis at home.  His wife manages meds  Patient brought medication card:yes  Pill box:did not bring  Plan: Encouraged patient to start to learn meds and eventually help with pill box fill; provided moderate adherence counseling/intervention    I spent a total of 20 minutes face to face with the patient delivering clinical care and providing education/counseling.    Patient was reviewed with Dr. Nestor Lewandowsky who was agreement with the stated plan:     During this visit, the following was completed:   BG log data assessment  BP log data assessment  Labs ordered and evaluated  complex treatment plan >1 DS     All questions/concerns were addressed to the patient's satisfaction.  __________________________________________  Einar Gip, PharmD  PGY2 Ambulatory Care Pharmacy Resident     Cleone Slim, PHARMD, CPP  [employer] TRANSPLANT CLINICAL PHARMACIST PRACTITIONER  PAGER [phone]

## 2020-05-20 DIAGNOSIS — R9389 Abnormal findings on diagnostic imaging of other specified body structures: Principal | ICD-10-CM

## 2020-05-20 DIAGNOSIS — Z940 Kidney transplant status: Principal | ICD-10-CM

## 2020-05-20 DIAGNOSIS — I313 Pericardial effusion (noninflammatory): Principal | ICD-10-CM

## 2020-05-20 LAB — VITAMIN D, TOTAL (25OH): Lab: 62.8

## 2020-05-20 LAB — CMV COMMENT: Lab: 0

## 2020-05-20 LAB — TACROLIMUS BLOOD: Lab: 9.1

## 2020-05-20 LAB — CMV DNA, QUANTITATIVE, PCR: CMV QUANT LOG10: 2.16 {Log_IU}/mL — ABNORMAL HIGH (ref <0.00)

## 2020-05-20 NOTE — Unmapped (Signed)
Reviewed recent labs and Tac levels with Dr. Ronnald Collum Chargualaf, no changes and will monitor

## 2020-05-21 LAB — BK VIRUS QUANTITATIVE PCR, BLOOD

## 2020-05-21 LAB — BK BLOOD LOG(10): Lab: 0

## 2020-05-24 ENCOUNTER — Encounter: Admit: 2020-05-24 | Discharge: 2020-05-25 | Payer: MEDICARE

## 2020-05-24 LAB — CBC W/ AUTO DIFF
BASOPHILS ABSOLUTE COUNT: 0.1 10*9/L (ref 0.0–0.1)
BASOPHILS RELATIVE PERCENT: 1.4 %
EOSINOPHILS ABSOLUTE COUNT: 0.3 10*9/L (ref 0.0–0.7)
EOSINOPHILS RELATIVE PERCENT: 7.9 %
HEMOGLOBIN: 12.6 g/dL — ABNORMAL LOW (ref 13.5–17.5)
LYMPHOCYTES ABSOLUTE COUNT: 0.3 10*9/L — ABNORMAL LOW (ref 0.7–4.0)
LYMPHOCYTES RELATIVE PERCENT: 8.9 %
MEAN CORPUSCULAR HEMOGLOBIN CONC: 33.4 g/dL (ref 30.0–36.0)
MEAN CORPUSCULAR HEMOGLOBIN: 32.3 pg (ref 26.0–34.0)
MEAN CORPUSCULAR VOLUME: 97 fL — ABNORMAL HIGH (ref 81.0–95.0)
MEAN PLATELET VOLUME: 7.1 fL (ref 7.0–10.0)
MONOCYTES ABSOLUTE COUNT: 0 10*9/L — ABNORMAL LOW (ref 0.1–1.0)
MONOCYTES RELATIVE PERCENT: 0.5 %
NEUTROPHILS RELATIVE PERCENT: 81.3 %
NUCLEATED RED BLOOD CELLS: 0 /100{WBCs} (ref <=4)
PLATELET COUNT: 163 10*9/L (ref 150–450)
RED BLOOD CELL COUNT: 3.91 10*12/L — ABNORMAL LOW (ref 4.32–5.72)
WBC ADJUSTED: 3.8 10*9/L (ref 3.5–10.5)

## 2020-05-24 LAB — RENAL FUNCTION PANEL
ALBUMIN: 4.1 g/dL (ref 3.4–5.0)
ANION GAP: 7 mmol/L (ref 5–14)
BLOOD UREA NITROGEN: 28 mg/dL — ABNORMAL HIGH (ref 9–23)
CALCIUM: 10.5 mg/dL — ABNORMAL HIGH (ref 8.7–10.4)
CHLORIDE: 115 mmol/L — ABNORMAL HIGH (ref 98–107)
CO2: 21.3 mmol/L (ref 20.0–31.0)
CREATININE: 1.61 mg/dL — ABNORMAL HIGH
GLUCOSE RANDOM: 119 mg/dL (ref 70–179)
PHOSPHORUS: 3.7 mg/dL (ref 2.4–5.1)
POTASSIUM: 4.9 mmol/L — ABNORMAL HIGH (ref 3.4–4.5)
SODIUM: 143 mmol/L (ref 135–145)

## 2020-05-24 LAB — GLUCOSE RANDOM: Glucose:MCnc:Pt:Ser/Plas:Qn:: 119

## 2020-05-24 LAB — TACROLIMUS BLOOD: Lab: 6.6

## 2020-05-24 LAB — VITAMIN D 1,25-DIHYDROXY: 1,25-Dihydroxyvitamin D:MCnc:Pt:Ser/Plas:Qn:: 25

## 2020-05-24 LAB — EOSINOPHILS ABSOLUTE COUNT: Eosinophils:NCnc:Pt:Bld:Qn:Automated count: 0.3

## 2020-05-24 LAB — MAGNESIUM: Magnesium:MCnc:Pt:Ser/Plas:Qn:: 1.6

## 2020-05-24 NOTE — Unmapped (Signed)
Therapy Update Follow Up: No issues - Copay = $38.71

## 2020-05-26 LAB — CMV DNA, QUANTITATIVE, PCR: CMV QUANT LOG10: 1.91 {Log_IU}/mL — ABNORMAL HIGH (ref <0.00)

## 2020-05-26 LAB — CMV COMMENT: Lab: 0

## 2020-05-27 LAB — HLA DS POST TRANSPLANT
ANTI-DONOR DRW #1 MFI: 142 MFI
ANTI-DONOR HLA-A #1 MFI: 300 MFI
ANTI-DONOR HLA-A #2 MFI: 135 MFI
ANTI-DONOR HLA-B #1 MFI: 79 MFI
ANTI-DONOR HLA-B #2 MFI: 142 MFI
ANTI-DONOR HLA-C #1 MFI: 309 MFI
ANTI-DONOR HLA-C #2 MFI: 365 MFI
ANTI-DONOR HLA-DQB #1 MFI: 154 MFI
ANTI-DONOR HLA-DR #1 MFI: 111 MFI
ANTI-DONOR HLA-DR #2 MFI: 175 MFI

## 2020-05-27 LAB — HLA CL1 ANTIBODY COMM: Lab: 0

## 2020-05-27 LAB — HLA CL2 AB COMMENT: Lab: 0

## 2020-05-27 LAB — FSAB CLASS 1 ANTIBODY SPECIFICITY: HLA CLASS 1 ANTIBODY RESULT: NEGATIVE

## 2020-05-27 LAB — DONOR HLA-DR ANTIGEN #1

## 2020-05-27 LAB — FSAB CLASS 2 ANTIBODY SPECIFICITY

## 2020-05-31 ENCOUNTER — Ambulatory Visit: Admit: 2020-05-31 | Discharge: 2020-06-01 | Payer: MEDICARE

## 2020-05-31 LAB — CBC W/ AUTO DIFF
BASOPHILS ABSOLUTE COUNT: 0.1 10*9/L (ref 0.0–0.1)
EOSINOPHILS ABSOLUTE COUNT: 0.2 10*9/L (ref 0.0–0.7)
EOSINOPHILS RELATIVE PERCENT: 9.7 %
HEMATOCRIT: 37.9 % — ABNORMAL LOW (ref 38.0–50.0)
HEMOGLOBIN: 12.3 g/dL — ABNORMAL LOW (ref 13.5–17.5)
LYMPHOCYTES ABSOLUTE COUNT: 0.3 10*9/L — ABNORMAL LOW (ref 0.7–4.0)
LYMPHOCYTES RELATIVE PERCENT: 12.6 %
MEAN CORPUSCULAR HEMOGLOBIN CONC: 32.4 g/dL (ref 30.0–36.0)
MEAN CORPUSCULAR VOLUME: 97 fL — ABNORMAL HIGH (ref 81.0–95.0)
MONOCYTES ABSOLUTE COUNT: 0 10*9/L — ABNORMAL LOW (ref 0.1–1.0)
MONOCYTES RELATIVE PERCENT: 1.5 %
NEUTROPHILS ABSOLUTE COUNT: 1.8 10*9/L (ref 1.7–7.7)
NEUTROPHILS RELATIVE PERCENT: 70.6 %
NUCLEATED RED BLOOD CELLS: 0 /100{WBCs} (ref <=4)
PLATELET COUNT: 189 10*9/L (ref 150–450)
RED BLOOD CELL COUNT: 3.9 10*12/L — ABNORMAL LOW (ref 4.32–5.72)
RED CELL DISTRIBUTION WIDTH: 16.8 % — ABNORMAL HIGH (ref 12.0–15.0)
WBC ADJUSTED: 2.6 10*9/L — ABNORMAL LOW (ref 3.5–10.5)

## 2020-05-31 LAB — BASOPHILS ABSOLUTE COUNT: Basophils:NCnc:Pt:Bld:Qn:Automated count: 0.1

## 2020-05-31 LAB — RENAL FUNCTION PANEL
ANION GAP: 5 mmol/L (ref 5–14)
BUN / CREAT RATIO: 19
CALCIUM: 10.5 mg/dL — ABNORMAL HIGH (ref 8.7–10.4)
CHLORIDE: 114 mmol/L — ABNORMAL HIGH (ref 98–107)
CO2: 23.2 mmol/L (ref 20.0–31.0)
CREATININE: 1.67 mg/dL — ABNORMAL HIGH
EGFR CKD-EPI AA MALE: 48 mL/min/{1.73_m2} — ABNORMAL LOW (ref >=60)
EGFR CKD-EPI NON-AA MALE: 42 mL/min/{1.73_m2} — ABNORMAL LOW (ref >=60)
GLUCOSE RANDOM: 125 mg/dL (ref 70–179)
PHOSPHORUS: 3.8 mg/dL (ref 2.4–5.1)
POTASSIUM: 5.4 mmol/L — ABNORMAL HIGH (ref 3.4–4.5)
SODIUM: 142 mmol/L (ref 135–145)

## 2020-05-31 LAB — GLUCOSE RANDOM: Glucose:MCnc:Pt:Ser/Plas:Qn:: 125

## 2020-05-31 LAB — SMEAR REVIEW

## 2020-05-31 LAB — TACROLIMUS BLOOD: Lab: 8.1

## 2020-05-31 LAB — MAGNESIUM: Magnesium:MCnc:Pt:Ser/Plas:Qn:: 1.6

## 2020-05-31 NOTE — Unmapped (Signed)
[facility] Refill Coordination Note    Specialty Medication(s) to be Shipped:   Transplant: Envarsus 4mg  and valgancyclovir 450mg     Other medication(s) to be shipped: allopurinol 100mg , amlodipine 10mg , carvedilol 6.25mg      Frank Leach, DOB: [birth year]  Phone: [phone] (home)       All above HIPAA information was verified with patient.     Was a Nurse, learning disability used for this call? No    Completed refill call assessment today to schedule patient's medication shipment from the [facility] [phone]).       Specialty medication(s) and dose(s) confirmed: Patient reports changes to the regimen as follows: patient and pharmacists are aware that valganciclovir 450mg  has increased from 1 tab daily to 2 tabs daily   Changes to medications: Frank Leach reports no changes at this time.  Changes to insurance: No  Questions for the pharmacist: No    Confirmed patient received Welcome Packet with first shipment. The patient will receive a drug information handout for each medication shipped and additional FDA Medication Guides as required.       DISEASE/MEDICATION-SPECIFIC INFORMATION        N/A    SPECIALTY MEDICATION ADHERENCE     Medication Adherence    Patient reported X missed doses in the last month: 0  Specialty Medication: Envarsus 4mg   Patient is on additional specialty medications: Yes  Additional Specialty Medications: Valganciclovir 450mg   Patient Reported Additional Medication X Missed Doses in the Last Month: 0  Patient is on more than two specialty medications: No  Demonstrates understanding of importance of adherence: yes  Informant: patient                Valganciclovir 450 mg: 5 days of medicine on hand   Envarsus 4 mg: 10 days of medicine on hand         SHIPPING     Shipping address confirmed in Epic.     Delivery Scheduled: Yes, Expected medication delivery date: 06/04/20.     Medication will be delivered via Next Day Courier to the prescription address in Epic WAM.    Frank Leach   [facility] Specialty Technician

## 2020-06-03 DIAGNOSIS — Z940 Kidney transplant status: Principal | ICD-10-CM

## 2020-06-03 DIAGNOSIS — Z79899 Other long term (current) drug therapy: Principal | ICD-10-CM

## 2020-06-03 MED ORDER — VALGANCICLOVIR 450 MG TABLET
ORAL_TABLET | Freq: Two times a day (BID) | ORAL | 3 refills | 90 days | Status: CP
Start: 2020-06-03 — End: ?
  Filled 2020-06-03: qty 60, 30d supply, fill #0

## 2020-06-03 MED FILL — AMLODIPINE 10 MG TABLET: ORAL | 30 days supply | Qty: 30 | Fill #10

## 2020-06-03 MED FILL — AMLODIPINE 10 MG TABLET: 30 days supply | Qty: 30 | Fill #10 | Status: AC

## 2020-06-03 MED FILL — CARVEDILOL 6.25 MG TABLET: ORAL | 90 days supply | Qty: 360 | Fill #3

## 2020-06-03 MED FILL — VALGANCICLOVIR 450 MG TABLET: 30 days supply | Qty: 60 | Fill #0 | Status: AC

## 2020-06-03 MED FILL — ENVARSUS XR 4 MG TABLET,EXTENDED RELEASE: ORAL | 30 days supply | Qty: 60 | Fill #1

## 2020-06-03 MED FILL — ALLOPURINOL 100 MG TABLET: 30 days supply | Qty: 30 | Fill #7 | Status: AC

## 2020-06-03 MED FILL — CARVEDILOL 6.25 MG TABLET: 90 days supply | Qty: 360 | Fill #3 | Status: AC

## 2020-06-03 MED FILL — ENVARSUS XR 4 MG TABLET,EXTENDED RELEASE: 30 days supply | Qty: 60 | Fill #1 | Status: AC

## 2020-06-03 MED FILL — ALLOPURINOL 100 MG TABLET: ORAL | 30 days supply | Qty: 30 | Fill #7

## 2020-06-04 ENCOUNTER — Encounter: Admit: 2020-06-04 | Discharge: 2020-06-05 | Payer: MEDICARE

## 2020-06-07 ENCOUNTER — Ambulatory Visit: Admit: 2020-06-07 | Discharge: 2020-06-08 | Payer: MEDICARE

## 2020-06-07 DIAGNOSIS — Z79899 Other long term (current) drug therapy: Principal | ICD-10-CM

## 2020-06-07 DIAGNOSIS — D849 Immunodeficiency, unspecified: Principal | ICD-10-CM

## 2020-06-07 DIAGNOSIS — B259 Cytomegaloviral disease, unspecified: Principal | ICD-10-CM

## 2020-06-07 DIAGNOSIS — Z940 Kidney transplant status: Principal | ICD-10-CM

## 2020-06-07 LAB — RENAL FUNCTION PANEL
ALBUMIN: 4.1 g/dL (ref 3.4–5.0)
ANION GAP: 6 mmol/L (ref 5–14)
BLOOD UREA NITROGEN: 26 mg/dL — ABNORMAL HIGH (ref 9–23)
BUN / CREAT RATIO: 17
CALCIUM: 10.6 mg/dL — ABNORMAL HIGH (ref 8.7–10.4)
CHLORIDE: 113 mmol/L — ABNORMAL HIGH (ref 98–107)
CO2: 22.5 mmol/L (ref 20.0–31.0)
CREATININE: 1.57 mg/dL — ABNORMAL HIGH
EGFR CKD-EPI AA MALE: 52 mL/min/{1.73_m2} — ABNORMAL LOW (ref >=60)
GLUCOSE RANDOM: 139 mg/dL (ref 70–179)
PHOSPHORUS: 3.8 mg/dL (ref 2.4–5.1)
POTASSIUM: 4.8 mmol/L — ABNORMAL HIGH (ref 3.4–4.5)
SODIUM: 141 mmol/L (ref 135–145)

## 2020-06-07 LAB — CBC W/ AUTO DIFF
BASOPHILS RELATIVE PERCENT: 1.4 %
EOSINOPHILS RELATIVE PERCENT: 4.2 %
HEMATOCRIT: 39.1 % (ref 38.0–50.0)
HEMOGLOBIN: 12.6 g/dL — ABNORMAL LOW (ref 13.5–17.5)
LYMPHOCYTES ABSOLUTE COUNT: 0.3 10*9/L — ABNORMAL LOW (ref 0.7–4.0)
LYMPHOCYTES RELATIVE PERCENT: 12 %
MEAN CORPUSCULAR HEMOGLOBIN CONC: 32.3 g/dL (ref 30.0–36.0)
MEAN CORPUSCULAR HEMOGLOBIN: 31.2 pg (ref 26.0–34.0)
MEAN CORPUSCULAR VOLUME: 96.6 fL — ABNORMAL HIGH (ref 81.0–95.0)
MONOCYTES ABSOLUTE COUNT: 0 10*9/L — ABNORMAL LOW (ref 0.1–1.0)
MONOCYTES RELATIVE PERCENT: 1.5 %
NEUTROPHILS ABSOLUTE COUNT: 2.1 10*9/L (ref 1.7–7.7)
NEUTROPHILS RELATIVE PERCENT: 80.9 %
NUCLEATED RED BLOOD CELLS: 0 /100{WBCs} (ref <=4)
PLATELET COUNT: 210 10*9/L (ref 150–450)
RED BLOOD CELL COUNT: 4.04 10*12/L — ABNORMAL LOW (ref 4.32–5.72)
RED CELL DISTRIBUTION WIDTH: 16.2 % — ABNORMAL HIGH (ref 12.0–15.0)
WBC ADJUSTED: 2.6 10*9/L — ABNORMAL LOW (ref 3.5–10.5)

## 2020-06-07 LAB — HEMATOCRIT: Hematocrit:VFr:Pt:Bld:Qn:: 39.1

## 2020-06-07 LAB — CMV DNA, QUANTITATIVE, PCR: CMV QUANT: 80 [IU]/mL — ABNORMAL HIGH (ref <0)

## 2020-06-07 LAB — CMV QUANT LOG10: Lab: 1.9 — ABNORMAL HIGH

## 2020-06-07 LAB — CHLORIDE: Chloride:SCnc:Pt:Ser/Plas:Qn:: 113 — ABNORMAL HIGH

## 2020-06-07 LAB — TACROLIMUS BLOOD: Lab: 7.4

## 2020-06-07 LAB — MAGNESIUM: Magnesium:MCnc:Pt:Ser/Plas:Qn:: 1.5 — ABNORMAL LOW

## 2020-06-07 NOTE — Unmapped (Signed)
After reviewing recent echocardiogram from 10-29 with Dr. Nestor Lewandowsky, no further testing needed at this time.

## 2020-06-14 ENCOUNTER — Ambulatory Visit: Admit: 2020-06-14 | Discharge: 2020-06-15 | Payer: MEDICARE

## 2020-06-14 DIAGNOSIS — D849 Immunodeficiency, unspecified: Principal | ICD-10-CM

## 2020-06-14 DIAGNOSIS — B259 Cytomegaloviral disease, unspecified: Principal | ICD-10-CM

## 2020-06-14 DIAGNOSIS — Z940 Kidney transplant status: Principal | ICD-10-CM

## 2020-06-14 DIAGNOSIS — Z79899 Other long term (current) drug therapy: Principal | ICD-10-CM

## 2020-06-14 LAB — CBC W/ AUTO DIFF
BASOPHILS ABSOLUTE COUNT: 0.2 10*9/L — ABNORMAL HIGH (ref 0.0–0.1)
BASOPHILS RELATIVE PERCENT: 4.8 %
EOSINOPHILS ABSOLUTE COUNT: 0.1 10*9/L (ref 0.0–0.7)
EOSINOPHILS RELATIVE PERCENT: 2.6 %
HEMATOCRIT: 39 % (ref 38.0–50.0)
HEMOGLOBIN: 12.5 g/dL — ABNORMAL LOW (ref 13.5–17.5)
LYMPHOCYTES ABSOLUTE COUNT: 0.5 10*9/L — ABNORMAL LOW (ref 0.7–4.0)
LYMPHOCYTES RELATIVE PERCENT: 14.7 %
MEAN CORPUSCULAR HEMOGLOBIN CONC: 32.2 g/dL (ref 30.0–36.0)
MEAN CORPUSCULAR HEMOGLOBIN: 31.3 pg (ref 26.0–34.0)
MEAN CORPUSCULAR VOLUME: 97.2 fL — ABNORMAL HIGH (ref 81.0–95.0)
MEAN PLATELET VOLUME: 7.8 fL (ref 7.0–10.0)
MONOCYTES ABSOLUTE COUNT: 0.1 10*9/L (ref 0.1–1.0)
MONOCYTES RELATIVE PERCENT: 2.1 %
NEUTROPHILS ABSOLUTE COUNT: 2.5 10*9/L (ref 1.7–7.7)
NEUTROPHILS RELATIVE PERCENT: 75.8 %
NUCLEATED RED BLOOD CELLS: 0 /100{WBCs} (ref <=4)
PLATELET COUNT: 216 10*9/L (ref 150–450)
RED BLOOD CELL COUNT: 4.01 10*12/L — ABNORMAL LOW (ref 4.32–5.72)
RED CELL DISTRIBUTION WIDTH: 16.3 % — ABNORMAL HIGH (ref 12.0–15.0)
WBC ADJUSTED: 3.3 10*9/L — ABNORMAL LOW (ref 3.5–10.5)

## 2020-06-14 LAB — MAGNESIUM: MAGNESIUM: 1.6 mg/dL (ref 1.6–2.6)

## 2020-06-14 LAB — RENAL FUNCTION PANEL
ALBUMIN: 3.9 g/dL (ref 3.4–5.0)
ANION GAP: 7 mmol/L (ref 5–14)
BLOOD UREA NITROGEN: 25 mg/dL — ABNORMAL HIGH (ref 9–23)
BUN / CREAT RATIO: 16
CALCIUM: 10.4 mg/dL (ref 8.7–10.4)
CHLORIDE: 113 mmol/L — ABNORMAL HIGH (ref 98–107)
CO2: 23.9 mmol/L (ref 20.0–31.0)
CREATININE: 1.56 mg/dL — ABNORMAL HIGH
EGFR CKD-EPI AA MALE: 52 mL/min/{1.73_m2} — ABNORMAL LOW (ref >=60)
EGFR CKD-EPI NON-AA MALE: 45 mL/min/{1.73_m2} — ABNORMAL LOW (ref >=60)
GLUCOSE RANDOM: 144 mg/dL (ref 70–179)
PHOSPHORUS: 3.9 mg/dL (ref 2.4–5.1)
POTASSIUM: 5.1 mmol/L — ABNORMAL HIGH (ref 3.4–4.5)
SODIUM: 144 mmol/L (ref 135–145)

## 2020-06-14 LAB — SLIDE REVIEW

## 2020-06-14 LAB — TACROLIMUS LEVEL: TACROLIMUS BLOOD: 6 ng/mL

## 2020-06-16 LAB — CMV DNA, QUANTITATIVE, PCR
CMV QUANT LOG10: 2.17 {Log_IU}/mL — ABNORMAL HIGH (ref <0.00)
CMV QUANT: 147 [IU]/mL — ABNORMAL HIGH (ref <0)

## 2020-06-21 ENCOUNTER — Ambulatory Visit: Admit: 2020-06-21 | Discharge: 2020-06-22 | Payer: MEDICARE

## 2020-06-21 DIAGNOSIS — D849 Immunodeficiency, unspecified: Principal | ICD-10-CM

## 2020-06-21 DIAGNOSIS — Z940 Kidney transplant status: Principal | ICD-10-CM

## 2020-06-21 DIAGNOSIS — Z79899 Other long term (current) drug therapy: Principal | ICD-10-CM

## 2020-06-21 DIAGNOSIS — B259 Cytomegaloviral disease, unspecified: Principal | ICD-10-CM

## 2020-06-21 LAB — RENAL FUNCTION PANEL
ALBUMIN: 4.1 g/dL (ref 3.4–5.0)
ANION GAP: 9 mmol/L (ref 5–14)
BLOOD UREA NITROGEN: 27 mg/dL — ABNORMAL HIGH (ref 9–23)
BUN / CREAT RATIO: 17
CALCIUM: 10.4 mg/dL (ref 8.7–10.4)
CHLORIDE: 114 mmol/L — ABNORMAL HIGH (ref 98–107)
CO2: 20.8 mmol/L (ref 20.0–31.0)
CREATININE: 1.55 mg/dL — ABNORMAL HIGH
EGFR CKD-EPI AA MALE: 53 mL/min/{1.73_m2} — ABNORMAL LOW (ref >=60)
EGFR CKD-EPI NON-AA MALE: 46 mL/min/{1.73_m2} — ABNORMAL LOW (ref >=60)
GLUCOSE RANDOM: 150 mg/dL (ref 70–179)
PHOSPHORUS: 3.7 mg/dL (ref 2.4–5.1)
POTASSIUM: 5.1 mmol/L — ABNORMAL HIGH (ref 3.4–4.5)
SODIUM: 144 mmol/L (ref 135–145)

## 2020-06-21 LAB — CBC W/ AUTO DIFF
BASOPHILS ABSOLUTE COUNT: 0.1 10*9/L (ref 0.0–0.1)
BASOPHILS RELATIVE PERCENT: 1.9 %
EOSINOPHILS ABSOLUTE COUNT: 0.1 10*9/L (ref 0.0–0.7)
EOSINOPHILS RELATIVE PERCENT: 2.7 %
HEMATOCRIT: 38.4 % (ref 38.0–50.0)
HEMOGLOBIN: 12.6 g/dL — ABNORMAL LOW (ref 13.5–17.5)
LYMPHOCYTES ABSOLUTE COUNT: 0.4 10*9/L — ABNORMAL LOW (ref 0.7–4.0)
LYMPHOCYTES RELATIVE PERCENT: 11.1 %
MEAN CORPUSCULAR HEMOGLOBIN CONC: 32.9 g/dL (ref 30.0–36.0)
MEAN CORPUSCULAR HEMOGLOBIN: 31.7 pg (ref 26.0–34.0)
MEAN CORPUSCULAR VOLUME: 96.5 fL — ABNORMAL HIGH (ref 81.0–95.0)
MEAN PLATELET VOLUME: 7.9 fL (ref 7.0–10.0)
MONOCYTES ABSOLUTE COUNT: 0 10*9/L — ABNORMAL LOW (ref 0.1–1.0)
MONOCYTES RELATIVE PERCENT: 0.8 %
NEUTROPHILS ABSOLUTE COUNT: 3.2 10*9/L (ref 1.7–7.7)
NEUTROPHILS RELATIVE PERCENT: 83.5 %
NUCLEATED RED BLOOD CELLS: 0 /100{WBCs} (ref <=4)
PLATELET COUNT: 201 10*9/L (ref 150–450)
RED BLOOD CELL COUNT: 3.97 10*12/L — ABNORMAL LOW (ref 4.32–5.72)
RED CELL DISTRIBUTION WIDTH: 16.3 % — ABNORMAL HIGH (ref 12.0–15.0)
WBC ADJUSTED: 3.9 10*9/L (ref 3.5–10.5)

## 2020-06-21 LAB — MAGNESIUM: MAGNESIUM: 1.6 mg/dL (ref 1.6–2.6)

## 2020-06-21 LAB — TACROLIMUS LEVEL: TACROLIMUS BLOOD: 8.2 ng/mL

## 2020-06-22 LAB — CMV DNA, QUANTITATIVE, PCR
CMV QUANT LOG10: 2.03 {Log_IU}/mL — ABNORMAL HIGH (ref <0.00)
CMV QUANT: 108 [IU]/mL — ABNORMAL HIGH (ref <0)

## 2020-06-23 DIAGNOSIS — T8610 Unspecified complication of kidney transplant: Principal | ICD-10-CM

## 2020-06-23 DIAGNOSIS — Z79899 Other long term (current) drug therapy: Principal | ICD-10-CM

## 2020-06-23 DIAGNOSIS — D849 Immunodeficiency, unspecified: Principal | ICD-10-CM

## 2020-06-23 DIAGNOSIS — Z940 Kidney transplant status: Principal | ICD-10-CM

## 2020-06-24 DIAGNOSIS — M109 Gout, unspecified: Principal | ICD-10-CM

## 2020-06-24 DIAGNOSIS — Z940 Kidney transplant status: Principal | ICD-10-CM

## 2020-06-24 MED ORDER — ALLOPURINOL 100 MG TABLET
ORAL_TABLET | Freq: Every evening | ORAL | 3 refills | 90.00000 days | Status: CP
Start: 2020-06-24 — End: ?
  Filled 2020-06-29: qty 90, 90d supply, fill #0

## 2020-06-25 NOTE — Unmapped (Signed)
[facility] Refill Coordination Note    Specialty Medication(s) to be Shipped:   Transplant: Envarsus 4mg  and valgancyclovir 450mg    **Denied Mycophenolate**    Other medication(s) to be shipped: allopurinol and amlodipine     Frank Leach, DOB: [birth year]  Phone: [phone] (home)       All above HIPAA information was verified with patient.     Was a Nurse, learning disability used for this call? No    Completed refill call assessment today to schedule patient's medication shipment from the [facility] [phone]).       Specialty medication(s) and dose(s) confirmed: Regimen is correct and unchanged.   Changes to medications: Daxtin reports no changes at this time.  Changes to insurance: No  Questions for the pharmacist: No    Confirmed patient received Welcome Packet with first shipment. The patient will receive a drug information handout for each medication shipped and additional FDA Medication Guides as required.       DISEASE/MEDICATION-SPECIFIC INFORMATION        N/A    SPECIALTY MEDICATION ADHERENCE     Medication Adherence    Patient reported X missed doses in the last month: 0  Specialty Medication: Envarsus 4mg   Patient is on additional specialty medications: Yes  Additional Specialty Medications: Valganciclovir 450mg   Patient Reported Additional Medication X Missed Doses in the Last Month: 0  Patient is on more than two specialty medications: No        Envarsus 4 mg: 9 days of medicine on hand   Valganciclovir 450 mg: 9 days of medicine on hand     SHIPPING     Shipping address confirmed in Epic.     Delivery Scheduled: Yes, Expected medication delivery date: 06/30/2020.     Medication will be delivered via UPS to the prescription address in Epic WAM.    Lorelei Pont [facility] Specialty Technician

## 2020-06-28 ENCOUNTER — Ambulatory Visit: Admit: 2020-06-28 | Discharge: 2020-06-29 | Payer: MEDICARE

## 2020-06-28 DIAGNOSIS — D849 Immunodeficiency, unspecified: Principal | ICD-10-CM

## 2020-06-28 DIAGNOSIS — Z940 Kidney transplant status: Principal | ICD-10-CM

## 2020-06-28 DIAGNOSIS — T8610 Unspecified complication of kidney transplant: Principal | ICD-10-CM

## 2020-06-28 DIAGNOSIS — Z79899 Other long term (current) drug therapy: Principal | ICD-10-CM

## 2020-06-28 LAB — SLIDE REVIEW

## 2020-06-28 LAB — CMV DNA, QUANTITATIVE, PCR
CMV QUANT LOG10: 2.24 {Log_IU}/mL — ABNORMAL HIGH (ref <0.00)
CMV QUANT: 174 [IU]/mL — ABNORMAL HIGH (ref <0)

## 2020-06-28 LAB — BASIC METABOLIC PANEL
ANION GAP: 6 mmol/L (ref 5–14)
BLOOD UREA NITROGEN: 22 mg/dL (ref 9–23)
BUN / CREAT RATIO: 14
CALCIUM: 10.5 mg/dL — ABNORMAL HIGH (ref 8.7–10.4)
CHLORIDE: 114 mmol/L — ABNORMAL HIGH (ref 98–107)
CO2: 21.9 mmol/L (ref 20.0–31.0)
CREATININE: 1.59 mg/dL — ABNORMAL HIGH
EGFR CKD-EPI AA MALE: 51 mL/min/{1.73_m2} — ABNORMAL LOW (ref >=60)
EGFR CKD-EPI NON-AA MALE: 44 mL/min/{1.73_m2} — ABNORMAL LOW (ref >=60)
GLUCOSE RANDOM: 131 mg/dL (ref 70–179)
POTASSIUM: 5.2 mmol/L — ABNORMAL HIGH (ref 3.4–4.5)
SODIUM: 142 mmol/L (ref 135–145)

## 2020-06-28 LAB — CBC W/ AUTO DIFF
BASOPHILS ABSOLUTE COUNT: 0.1 10*9/L (ref 0.0–0.1)
BASOPHILS RELATIVE PERCENT: 1.5 %
EOSINOPHILS ABSOLUTE COUNT: 0.1 10*9/L (ref 0.0–0.7)
EOSINOPHILS RELATIVE PERCENT: 2.5 %
HEMATOCRIT: 38.2 % (ref 38.0–50.0)
HEMOGLOBIN: 12.8 g/dL — ABNORMAL LOW (ref 13.5–17.5)
LYMPHOCYTES ABSOLUTE COUNT: 0.4 10*9/L — ABNORMAL LOW (ref 0.7–4.0)
LYMPHOCYTES RELATIVE PERCENT: 10 %
MEAN CORPUSCULAR HEMOGLOBIN CONC: 33.5 g/dL (ref 30.0–36.0)
MEAN CORPUSCULAR HEMOGLOBIN: 32.4 pg (ref 26.0–34.0)
MEAN CORPUSCULAR VOLUME: 96.7 fL — ABNORMAL HIGH (ref 81.0–95.0)
MEAN PLATELET VOLUME: 7.6 fL (ref 7.0–10.0)
MONOCYTES ABSOLUTE COUNT: 0 10*9/L — ABNORMAL LOW (ref 0.1–1.0)
MONOCYTES RELATIVE PERCENT: 0.9 %
NEUTROPHILS ABSOLUTE COUNT: 3.3 10*9/L (ref 1.7–7.7)
NEUTROPHILS RELATIVE PERCENT: 85.1 %
NUCLEATED RED BLOOD CELLS: 0 /100{WBCs} (ref <=4)
PLATELET COUNT: 190 10*9/L (ref 150–450)
RED BLOOD CELL COUNT: 3.95 10*12/L — ABNORMAL LOW (ref 4.32–5.72)
RED CELL DISTRIBUTION WIDTH: 16.2 % — ABNORMAL HIGH (ref 12.0–15.0)
WBC ADJUSTED: 3.8 10*9/L (ref 3.5–10.5)

## 2020-06-28 LAB — MAGNESIUM: MAGNESIUM: 1.6 mg/dL (ref 1.6–2.6)

## 2020-06-28 LAB — PHOSPHORUS: PHOSPHORUS: 3.5 mg/dL (ref 2.4–5.1)

## 2020-06-28 NOTE — Unmapped (Signed)
TRF UNOS form

## 2020-06-29 MED FILL — AMLODIPINE 10 MG TABLET: ORAL | 30 days supply | Qty: 30 | Fill #11

## 2020-06-29 MED FILL — ENVARSUS XR 4 MG TABLET,EXTENDED RELEASE: ORAL | 30 days supply | Qty: 60 | Fill #2

## 2020-06-29 MED FILL — ALLOPURINOL 100 MG TABLET: 90 days supply | Qty: 90 | Fill #0 | Status: AC

## 2020-06-29 MED FILL — VALGANCICLOVIR 450 MG TABLET: ORAL | 30 days supply | Qty: 60 | Fill #0

## 2020-06-29 MED FILL — ENVARSUS XR 4 MG TABLET,EXTENDED RELEASE: 30 days supply | Qty: 60 | Fill #2 | Status: AC

## 2020-06-29 MED FILL — AMLODIPINE 10 MG TABLET: 30 days supply | Qty: 30 | Fill #11 | Status: AC

## 2020-06-29 MED FILL — VALGANCICLOVIR 450 MG TABLET: 30 days supply | Qty: 60 | Fill #0 | Status: AC

## 2020-06-30 LAB — BK VIRUS QUANTITATIVE PCR, BLOOD: BK BLOOD RESULT: NOT DETECTED

## 2020-07-05 ENCOUNTER — Ambulatory Visit: Admit: 2020-07-05 | Discharge: 2020-07-06 | Payer: MEDICARE

## 2020-07-05 DIAGNOSIS — Z4822 Encounter for aftercare following kidney transplant: Principal | ICD-10-CM

## 2020-07-05 DIAGNOSIS — Z1159 Encounter for screening for other viral diseases: Principal | ICD-10-CM

## 2020-07-05 DIAGNOSIS — N189 Chronic kidney disease, unspecified: Principal | ICD-10-CM

## 2020-07-05 DIAGNOSIS — D631 Anemia in chronic kidney disease: Principal | ICD-10-CM

## 2020-07-05 DIAGNOSIS — Z940 Kidney transplant status: Principal | ICD-10-CM

## 2020-07-05 DIAGNOSIS — Z79899 Other long term (current) drug therapy: Principal | ICD-10-CM

## 2020-07-05 DIAGNOSIS — Z114 Encounter for screening for human immunodeficiency virus [HIV]: Principal | ICD-10-CM

## 2020-07-05 LAB — CBC W/ AUTO DIFF
BASOPHILS ABSOLUTE COUNT: 0.1 10*9/L (ref 0.0–0.1)
BASOPHILS RELATIVE PERCENT: 2.6 %
EOSINOPHILS ABSOLUTE COUNT: 0.1 10*9/L (ref 0.0–0.7)
EOSINOPHILS RELATIVE PERCENT: 2.7 %
HEMATOCRIT: 39.3 % (ref 38.0–50.0)
HEMOGLOBIN: 13.1 g/dL — ABNORMAL LOW (ref 13.5–17.5)
LYMPHOCYTES ABSOLUTE COUNT: 0.3 10*9/L — ABNORMAL LOW (ref 0.7–4.0)
LYMPHOCYTES RELATIVE PERCENT: 8.8 %
MEAN CORPUSCULAR HEMOGLOBIN CONC: 33.2 g/dL (ref 30.0–36.0)
MEAN CORPUSCULAR HEMOGLOBIN: 32.2 pg (ref 26.0–34.0)
MEAN CORPUSCULAR VOLUME: 97 fL — ABNORMAL HIGH (ref 81.0–95.0)
MEAN PLATELET VOLUME: 8.5 fL (ref 7.0–10.0)
MONOCYTES ABSOLUTE COUNT: 0 10*9/L — ABNORMAL LOW (ref 0.1–1.0)
MONOCYTES RELATIVE PERCENT: 0.6 %
NEUTROPHILS ABSOLUTE COUNT: 3.2 10*9/L (ref 1.7–7.7)
NEUTROPHILS RELATIVE PERCENT: 85.3 %
NUCLEATED RED BLOOD CELLS: 0 /100{WBCs} (ref <=4)
PLATELET COUNT: 205 10*9/L (ref 150–450)
RED BLOOD CELL COUNT: 4.05 10*12/L — ABNORMAL LOW (ref 4.32–5.72)
RED CELL DISTRIBUTION WIDTH: 16.3 % — ABNORMAL HIGH (ref 12.0–15.0)
WBC ADJUSTED: 3.8 10*9/L (ref 3.5–10.5)

## 2020-07-05 LAB — URINALYSIS
BILIRUBIN UA: NEGATIVE
GLUCOSE UA: NEGATIVE
KETONES UA: NEGATIVE
LEUKOCYTE ESTERASE UA: NEGATIVE
NITRITE UA: NEGATIVE
PH UA: 5.5 (ref 5.0–9.0)
PROTEIN UA: 30 — AB
RBC UA: 4 /HPF — ABNORMAL HIGH (ref <3)
SPECIFIC GRAVITY UA: 1.02 (ref 1.005–1.040)
SQUAMOUS EPITHELIAL: 3 /HPF (ref 0–5)
UROBILINOGEN UA: 0.2
WBC UA: 2 /HPF — ABNORMAL HIGH (ref <2)

## 2020-07-05 LAB — LIPID PANEL
CHOLESTEROL/HDL RATIO SCREEN: 2.7 (ref 1.0–4.5)
CHOLESTEROL: 88 mg/dL (ref <=200)
HDL CHOLESTEROL: 33 mg/dL — ABNORMAL LOW (ref 40–60)
LDL CHOLESTEROL CALCULATED: 41 mg/dL (ref 40–99)
NON-HDL CHOLESTEROL: 55 mg/dL — ABNORMAL LOW (ref 70–130)
TRIGLYCERIDES: 68 mg/dL (ref 0–150)
VLDL CHOLESTEROL CAL: 13.6 mg/dL (ref 12–42)

## 2020-07-05 LAB — COMPREHENSIVE METABOLIC PANEL
ALBUMIN: 3.8 g/dL (ref 3.4–5.0)
ALKALINE PHOSPHATASE: 87 U/L (ref 46–116)
ALT (SGPT): 22 U/L (ref 10–49)
ANION GAP: 5 mmol/L (ref 5–14)
AST (SGOT): 20 U/L (ref <=34)
BILIRUBIN TOTAL: 0.5 mg/dL (ref 0.3–1.2)
BLOOD UREA NITROGEN: 25 mg/dL — ABNORMAL HIGH (ref 9–23)
BUN / CREAT RATIO: 17
CALCIUM: 10.6 mg/dL — ABNORMAL HIGH (ref 8.7–10.4)
CHLORIDE: 114 mmol/L — ABNORMAL HIGH (ref 98–107)
CO2: 23.9 mmol/L (ref 20.0–31.0)
CREATININE: 1.5 mg/dL — ABNORMAL HIGH
EGFR CKD-EPI AA MALE: 55 mL/min/{1.73_m2} — ABNORMAL LOW (ref >=60)
EGFR CKD-EPI NON-AA MALE: 47 mL/min/{1.73_m2} — ABNORMAL LOW (ref >=60)
GLUCOSE RANDOM: 139 mg/dL — ABNORMAL HIGH (ref 70–99)
POTASSIUM: 5.2 mmol/L — ABNORMAL HIGH (ref 3.5–5.1)
PROTEIN TOTAL: 7.1 g/dL (ref 5.7–8.2)
SODIUM: 143 mmol/L (ref 135–145)

## 2020-07-05 LAB — IRON & TIBC
IRON SATURATION (CALC): 30 %
IRON: 64 ug/dL — ABNORMAL LOW
TOTAL IRON BINDING CAPACITY (CALC): 212.9 mg/dL
TRANSFERRIN: 169 mg/dL — ABNORMAL LOW

## 2020-07-05 LAB — TACROLIMUS LEVEL: TACROLIMUS BLOOD: 8.9 ng/mL

## 2020-07-05 LAB — PHOSPHORUS: PHOSPHORUS: 3.8 mg/dL (ref 2.4–5.1)

## 2020-07-05 LAB — BILIRUBIN, DIRECT: BILIRUBIN DIRECT: 0.2 mg/dL (ref 0.00–0.30)

## 2020-07-05 LAB — HEMOGLOBIN A1C
ESTIMATED AVERAGE GLUCOSE: 169 mg/dL
HEMOGLOBIN A1C: 7.5 % — ABNORMAL HIGH (ref 4.8–5.6)

## 2020-07-05 LAB — MAGNESIUM: MAGNESIUM: 1.5 mg/dL — ABNORMAL LOW (ref 1.6–2.6)

## 2020-07-05 LAB — PROTEIN / CREATININE RATIO, URINE
CREATININE, URINE: 94 mg/dL
PROTEIN URINE: 38.8 mg/dL
PROTEIN/CREAT RATIO, URINE: 0.413

## 2020-07-05 LAB — HEPATITIS B CORE ANTIBODY, TOTAL: HEPATITIS B CORE TOTAL ANTIBODY: NONREACTIVE

## 2020-07-05 LAB — HEPATITIS C ANTIBODY: HEPATITIS C ANTIBODY: REACTIVE — AB

## 2020-07-05 LAB — HEPATITIS B SURFACE ANTIGEN: HEPATITIS B SURFACE ANTIGEN: NONREACTIVE

## 2020-07-05 LAB — HIV ANTIGEN/ANTIBODY COMBO: HIV ANTIGEN/ANTIBODY COMBO: NONREACTIVE

## 2020-07-07 ENCOUNTER — Ambulatory Visit: Admit: 2020-07-07 | Discharge: 2020-07-08 | Payer: MEDICARE | Attending: Nephrology | Primary: Nephrology

## 2020-07-07 DIAGNOSIS — B259 Cytomegaloviral disease, unspecified: Principal | ICD-10-CM

## 2020-07-07 DIAGNOSIS — Z940 Kidney transplant status: Principal | ICD-10-CM

## 2020-07-07 DIAGNOSIS — R5383 Other fatigue: Principal | ICD-10-CM

## 2020-07-07 DIAGNOSIS — Z6825 Body mass index (BMI) 25.0-25.9, adult: Principal | ICD-10-CM

## 2020-07-07 LAB — HEPATITIS C RNA, QUANTITATIVE, PCR: HCV RNA: NOT DETECTED

## 2020-07-07 LAB — CMV DNA, QUANTITATIVE, PCR
CMV QUANT LOG10: 2.2 {Log_IU}/mL — ABNORMAL HIGH (ref <0.00)
CMV QUANT: 157 [IU]/mL — ABNORMAL HIGH (ref <0)

## 2020-07-07 LAB — TSH: THYROID STIMULATING HORMONE: 3.637 u[IU]/mL (ref 0.550–4.780)

## 2020-07-07 MED ORDER — ENVARSUS XR 1 MG TABLET,EXTENDED RELEASE
ORAL_TABLET | 11 refills | 0.00000 days | Status: CP
Start: 2020-07-07 — End: ?

## 2020-07-07 MED ORDER — HYDRALAZINE 25 MG TABLET
ORAL_TABLET | Freq: Two times a day (BID) | ORAL | 11 refills | 30.00000 days | Status: CP
Start: 2020-07-07 — End: 2021-07-07
  Filled 2020-07-09: qty 60, 30d supply, fill #0

## 2020-07-07 MED ORDER — ENVARSUS XR 4 MG TABLET,EXTENDED RELEASE
ORAL_TABLET | Freq: Every day | ORAL | 11 refills | 0.00000 days | Status: CP
Start: 2020-07-07 — End: ?

## 2020-07-07 NOTE — Unmapped (Signed)
Transplant Nephrology Clinic Visit    Subjective/Interval:   Since patient's last visit in the transplant clinic - patient has been doing well in terms of transplant, taking transplant medications regularly, no episodes of rejection and no side effects of medications.    CT findings from 04/07/20 were overall stable, however, multichamber cardiomegaly with pericardial effusion was new from prior. Patient scheduled for ECHO to follow upon pericardial effusion.     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     He presents today feeling overall well. Tolerating his immunosuppression therapy well. His B.P is still not well controlled - has readings of >150 systolic.  Denies tremors. Denies chest pain or tightness, nausea, vomiting, diarrhea, arthralgia, myalgia, cough, fatigue, gout, lower extremity swelling, or dysuria.    Last dose of Envarsus at 9 am yesterday.     Assessment:  69 y.o. 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       Recommendations/Plan:     Allograft Function: DDKT KDPI: 42%06/22/2019  Renal function holding relatively stable with electrolytes and acid base balanced.     Baseline Cr: ~ 1.57  Last Cr:  1.50 Date:  07/05/20   [place]/ BK     neg Date:  06/28/20   DSA     neg Date:  01/19/20  CMV:     157 Date:  07/05/20  UPC:     0.413 Date:  07/05/20.     Patient asked to continue with strict k restriction due to hyperkalemia     Immunosuppression Management [High Risk Medical Decision Making For Drug Therapy Requiring Intensive Monitoring For Toxicity]:     Tacro target: 7-9  Tacro last lvl /date: 8.9 (07/05/20).   MMF/MPA dose: 180 mg BID  Side effects: decrease envarsus to 7 mg.    Blood Pressure Management: Home BPs have been well-controlled. Continue current BP regimen. Will add Hydralazine 25 mg BID.    Lipid Management: Last lipid panel on 05/17/2020.  The 10-year ASCVD risk score Denman George DC Jr., et al., 2013) is: 36.3%  Currently taking atorvastatin     Electrolytes: WNL    Infectious Prophylaxis and Monitoring: CMV VL 331 (05/10/20). Valcyte increased to 450 mg BID on 05/13/20. Added valcyte resistance panel.    RCC s/p bilateral nephrectomy:   CT RMP 08/29/2018 showed no suspicious enhancing lesions.   Repeat CT findings from 04/07/20 were overall stable.    Health Maintenance:   Colonoscopy: 04/30/2018 - 1 adenomatous polyp; repeat 5-10 years    Immunizations:   Flu Shot: 05/19/2020  Prevnar 13: 10/30/2013  Pneumovax: 04/24/2016, due 2022  No immunizations till 1 year post transplant.  COVID-19 (Moderna): 09/12/19, 10/14/19; booster received 04/07/20    History of Present Illness    69 y.o. male here for follow up after kidney transplantation.  Patient has ESRD secondary to HTN.  Patient has been on dialysis since 01/25/2012.  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 Patient was admitted for kidney transplant on 06/21/2019.        Transplant History:    Organ Received: Left DDKT, DBD, PHS, KDPI: 42%; 21h cold ischemia  Native Kidney Disease: HTN; cPRA: 0%  Date of Transplant: 06/22/2019  Post-Transplant Course: HD once for high potassium  Prior Transplants: none  Induction: [place]  Date of Ureteral Stent Removal: 07/29/2019  CMV/EBV  Status: CMV D+/R+  EBV D+/R+  Rejection Episodes: none  Donor Specific Antibodies: none  Results of Renal Imaging (pre and post):   Pre-Txp 08/29/2018 CT RMP  Sequela of bilateral nephrectomy. Interval decrease in linear soft tissue within the right nephrectomy bed, potentially postsurgical or fat necrosis. Unchanged linear tissue within the left nephrectomy bed. No suspicious enhancing lesions are visualized within the surgical beds    Post-Txp 06/25/2019 (txp kidney only)  The renal transplant was located in the left lower quadrant. Normal size and echogenicity.  No solid masses or calculi. Trace perinephric fluid adjacent to the lower pole of the kidney. Mild pelviectasis  - Perfusion: Using power Doppler, normal perfusion was seen throughout the renal parenchyma.  - Resistive indices in the renal transplant are stable compared with prior examination.  - Main renal artery/iliac artery: Patent. Again noted are 3 renal arteries. Resistive indices within the renal arteries are stable to minimally increased, now at or just above normal limits.  - Main renal vein/iliac vein: Patent    Past Medical History  1. HTN  2. DM II  3. RCC s/p Bilateral nephrectomy (L 04/2016, R 12/2016)  4. GERD  5. HLD  6. 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    Medications  Current Outpatient Medications   Medication Sig Dispense Refill   ??? acetaminophen (TYLENOL) 500 MG tablet Take 1-2 tablets (500-1,000 mg total) by mouth every six (6) hours as needed for pain or fever (> 38C). 100 tablet 0   ??? allopurinoL (ZYLOPRIM) 100 MG tablet Take 1 tablet (100 mg total) by mouth every evening. 90 tablet 3   ??? amLODIPine (NORVASC) 10 MG tablet Take 1 tablet (10 mg total) by mouth daily. 30 tablet 11   ??? atorvastatin (LIPITOR) 40 MG tablet Take 1 tablet (40 mg total) by mouth daily. (Patient taking differently: Take 40 mg by mouth nightly. ) 90 tablet 3   ??? carvediloL (COREG) 6.25 MG tablet Take 2 tablets (12.5 mg total) by mouth Two (2) times a day. 360 tablet 3   ??? cholecalciferol, vitamin D3-125 mcg, 5,000 unit,, 125 mcg (5,000 unit) tablet Take 1 tablet (125 mcg total) by mouth daily.     ??? ENVARSUS XR 4 mg Tb24 extended release tablet Take 2 tablets (8 mg total) by mouth daily. 60 tablet 11   ??? magnesium oxide-Mg AA chelate (MAGNESIUM, AMINO ACID CHELATE,) 133 mg Tab Take 1 tablet by mouth two (2) times a day. 100 tablet 11   ??? mycophenolate (MYFORTIC) 180 MG EC tablet Take 1 tablet (180 mg total) by mouth Two (2) times a day. 60 tablet 11   ??? omeprazole (PRILOSEC) 40 MG capsule Take 1 capsule (40 mg total) by mouth every other day. 45 capsule 3   ??? tacrolimus (ENVARSUS XR) 1 mg Tb24 extended release tablet HOLD (Patient not taking: Reported on 05/19/2020) 30 tablet 11   ??? valGANciclovir (VALCYTE) 450 mg tablet Take 1 tablet (450 mg total) by mouth Two (2) times a day. 180 tablet 3     No current facility-administered medications for this visit.     Physical Exam:   BP 176/73  - Pulse 54  - Temp 36.4 ??C (97.6 ??F) (Temporal)  - Ht 170.2 cm (5' 7)  - Wt 74.4 kg (164 lb)  - BMI 25.69 kg/m??  Nursing note and vitals reviewed.   Constitutional: Oriented to person, place, and time. Appears well-developed and well-nourished. No distress.   HENT: Wearing face mask.  Head: Normocephalic and atraumatic.   Eyes: Right eye exhibits no discharge. Left eye exhibits no discharge. No scleral icterus.   Neck: Normal range of motion. Neck supple.   Cardiovascular: Normal rate and regular rhythm. Exam reveals no gallop and no friction rub. No murmur heard.   Pulmonary/Chest: Effort normal and breath sounds normal. No respiratory distress.   Abdominal: Soft. Non tender  Musculoskeletal: Normal range of motion. No edema and no tenderness.   Neurological: Alert and oriented to person, place, and time.   Skin: Skin is warm and dry. No rash noted. Not diaphoretic. No erythema. No pallor.   Psychiatric: Normal mood and affect. Behavior is normal. Judgment and thought content normal.       Laboratory Data and Imaging reviewed in EPIC    Follow-Up: Return to clinic in 2 months.   Labs: twice a month.    Leeroy Bock, MD

## 2020-07-07 NOTE — Unmapped (Addendum)
Transplant Coordinator, Clinic Visit   Pt seen today by transplant nephrology for follow up, reviewed medications and symptoms.          07/07/20 1121   BP: 176/73   Pulse: 54   Temp: 36.4 ??C (97.6 ??F)   Weight: 74.4 kg (164 lb)   Height: 170.2 cm (5' 7)   PainSc: 0-No pain       Pt. In clinic with wife, appears doing well     Assessment  BP: high today, at home 154/72, reviewed logs   Lightheaded: none  BG: wnl, doing well  Headache: none  Hand tremors: mild   Numbness/tingling: none  Fevers: none  Chills/sweats: one night sweats last night, d/t heat   Shortness of breath: none  Chest pain or pressure: none  Palpitations: none  Abdominal pain: none  Heart burn: none  Nausea/vomiting: none  Diarrhea/constipation: none  UTI symptoms: none  Swelling: pt. Wears compression hose  Sleep: sleeps well  Pain: none    Good appetite; reports adequate hydration.     Intake: 4-5 bottles a day   Output: normal and once at night    Any new medications? none  Immunosuppressant last taken: took Envarsus this morning, labs on Monday     Immunization status: up to date    Functional Score: 100   Normal no complaints; no evidence of  disease.   Employment status is: retired    I spent a total of 5 minutes with [employer] reviewing medications and symptoms.      Medication changes to include: Envarsus now 7 mg daily, Rx sent.   Patient to take hydralazine 25 mg BID  Pt. Can get blood every other week  RTC in 3 months

## 2020-07-08 LAB — VITAMIN D 25 HYDROXY: VITAMIN D, TOTAL (25OH): 47.2 ng/mL (ref 20.0–80.0)

## 2020-07-08 LAB — EBV QUANTITATIVE PCR, BLOOD: EBV VIRAL LOAD RESULT: NOT DETECTED

## 2020-07-09 DIAGNOSIS — Z79899 Other long term (current) drug therapy: Principal | ICD-10-CM

## 2020-07-09 DIAGNOSIS — Z940 Kidney transplant status: Principal | ICD-10-CM

## 2020-07-09 DIAGNOSIS — B259 Cytomegaloviral disease, unspecified: Principal | ICD-10-CM

## 2020-07-09 LAB — VITAMIN D 1,25 DIHYDROXY: VITAMIN D 1,25-DIHYDROXY: 19 pg/mL

## 2020-07-09 MED FILL — HYDRALAZINE 25 MG TABLET: 30 days supply | Qty: 60 | Fill #0 | Status: AC

## 2020-07-09 MED FILL — ENVARSUS XR 1 MG TABLET,EXTENDED RELEASE: 30 days supply | Qty: 90 | Fill #0 | Status: AC

## 2020-07-09 MED FILL — ENVARSUS XR 1 MG TABLET,EXTENDED RELEASE: 30 days supply | Qty: 90 | Fill #0

## 2020-07-09 NOTE — Unmapped (Signed)
Clinical Assessment Needed For: Dose Change  Medication: Envarsus 1 mg and 4 mg  Last Fill Date/Day Supply: 01/14/2020 / 30 (1 mg), 06/29/2020 / 30 (4 mg)  Copay $0  Was previous dose already scheduled to fill: No    Notes to Pharmacist: hydralazine $3.91 / 30

## 2020-07-09 NOTE — Unmapped (Signed)
[facility] Refill Coordination Note    Specialty Medication(s) to be Shipped:   Transplant: Envarsus 1mg     Other medication(s) to be shipped: hydralazine     [name], DOB: 1950-12-30  Phone: [phone] (home)       All above HIPAA information was verified with patient.     Was a Nurse, learning disability used for this call? No    Completed refill call assessment today to schedule patient's medication shipment from the [facility] [phone]).       Specialty medication(s) and dose(s) confirmed: Regimen is correct and unchanged.   Changes to medications: Yousef reports no changes at this time.  Changes to insurance: No  Questions for the pharmacist: No    Confirmed patient received Welcome Packet with first shipment. The patient will receive a drug information handout for each medication shipped and additional FDA Medication Guides as required.       DISEASE/MEDICATION-SPECIFIC INFORMATION        N/A    SPECIALTY MEDICATION ADHERENCE     Medication Adherence    Patient reported X missed doses in the last month: 0  Specialty Medication: Envarsus Xr 1mg   Patient is on additional specialty medications: No                Envarsus Xr 1 mg: 5 days of medicine on hand       SHIPPING     Shipping address confirmed in Epic.     Delivery Scheduled: Yes, Expected medication delivery date: hydralazine.     Medication will be delivered via UPS to the prescription address in Epic WAM.    Tera Helper   [facility] Specialty Pharmacist

## 2020-07-12 DIAGNOSIS — Z79899 Other long term (current) drug therapy: Principal | ICD-10-CM

## 2020-07-12 DIAGNOSIS — Z940 Kidney transplant status: Principal | ICD-10-CM

## 2020-07-12 DIAGNOSIS — B259 Cytomegaloviral disease, unspecified: Principal | ICD-10-CM

## 2020-07-12 LAB — HLA DS POST TRANSPLANT
ANTI-DONOR DRW #1 MFI: 110 MFI
ANTI-DONOR HLA-A #1 MFI: 241 MFI
ANTI-DONOR HLA-A #2 MFI: 0 MFI
ANTI-DONOR HLA-B #1 MFI: 22 MFI
ANTI-DONOR HLA-B #2 MFI: 57 MFI
ANTI-DONOR HLA-C #1 MFI: 198 MFI
ANTI-DONOR HLA-C #2 MFI: 180 MFI
ANTI-DONOR HLA-DP AG #1 MFI: 30 MFI
ANTI-DONOR HLA-DQB #1 MFI: 436 MFI
ANTI-DONOR HLA-DQB #2 MFI: 287 MFI
ANTI-DONOR HLA-DR #1 MFI: 34 MFI
ANTI-DONOR HLA-DR #2 MFI: 0 MFI

## 2020-07-12 LAB — FSAB CLASS 2 ANTIBODY SPECIFICITY: HLA CL2 AB RESULT: POSITIVE

## 2020-07-12 LAB — FSAB CLASS 1 ANTIBODY SPECIFICITY: HLA CLASS 1 ANTIBODY RESULT: NEGATIVE

## 2020-07-19 ENCOUNTER — Ambulatory Visit: Admit: 2020-07-19 | Discharge: 2020-07-20 | Payer: MEDICARE

## 2020-07-19 DIAGNOSIS — Z79899 Other long term (current) drug therapy: Principal | ICD-10-CM

## 2020-07-19 DIAGNOSIS — Z940 Kidney transplant status: Principal | ICD-10-CM

## 2020-07-19 DIAGNOSIS — B259 Cytomegaloviral disease, unspecified: Principal | ICD-10-CM

## 2020-07-19 LAB — CBC W/ AUTO DIFF
BASOPHILS ABSOLUTE COUNT: 0.1 10*9/L (ref 0.0–0.1)
BASOPHILS RELATIVE PERCENT: 2.6 %
EOSINOPHILS ABSOLUTE COUNT: 0.1 10*9/L (ref 0.0–0.7)
EOSINOPHILS RELATIVE PERCENT: 3.9 %
HEMATOCRIT: 38.9 % (ref 38.0–50.0)
HEMOGLOBIN: 12.7 g/dL — ABNORMAL LOW (ref 13.5–17.5)
LYMPHOCYTES ABSOLUTE COUNT: 0.4 10*9/L — ABNORMAL LOW (ref 0.7–4.0)
LYMPHOCYTES RELATIVE PERCENT: 14.2 %
MEAN CORPUSCULAR HEMOGLOBIN CONC: 32.7 g/dL (ref 30.0–36.0)
MEAN CORPUSCULAR HEMOGLOBIN: 31.7 pg (ref 26.0–34.0)
MEAN CORPUSCULAR VOLUME: 97.1 fL — ABNORMAL HIGH (ref 81.0–95.0)
MEAN PLATELET VOLUME: 8.1 fL (ref 7.0–10.0)
MONOCYTES ABSOLUTE COUNT: 0.1 10*9/L (ref 0.1–1.0)
MONOCYTES RELATIVE PERCENT: 2.4 %
NEUTROPHILS ABSOLUTE COUNT: 2.4 10*9/L (ref 1.7–7.7)
NEUTROPHILS RELATIVE PERCENT: 76.9 %
NUCLEATED RED BLOOD CELLS: 0 /100{WBCs} (ref <=4)
PLATELET COUNT: 188 10*9/L (ref 150–450)
RED BLOOD CELL COUNT: 4.01 10*12/L — ABNORMAL LOW (ref 4.32–5.72)
RED CELL DISTRIBUTION WIDTH: 15.5 % — ABNORMAL HIGH (ref 12.0–15.0)
WBC ADJUSTED: 3.1 10*9/L — ABNORMAL LOW (ref 3.5–10.5)

## 2020-07-19 LAB — RENAL FUNCTION PANEL
ALBUMIN: 3.9 g/dL (ref 3.4–5.0)
ANION GAP: 6 mmol/L (ref 5–14)
BLOOD UREA NITROGEN: 27 mg/dL — ABNORMAL HIGH (ref 9–23)
BUN / CREAT RATIO: 16
CALCIUM: 10.1 mg/dL (ref 8.7–10.4)
CHLORIDE: 113 mmol/L — ABNORMAL HIGH (ref 98–107)
CO2: 22.2 mmol/L (ref 20.0–31.0)
CREATININE: 1.72 mg/dL — ABNORMAL HIGH
EGFR CKD-EPI AA MALE: 47 mL/min/{1.73_m2} — ABNORMAL LOW (ref >=60)
EGFR CKD-EPI NON-AA MALE: 40 mL/min/{1.73_m2} — ABNORMAL LOW (ref >=60)
GLUCOSE RANDOM: 144 mg/dL (ref 70–179)
PHOSPHORUS: 3.7 mg/dL (ref 2.4–5.1)
POTASSIUM: 5.1 mmol/L — ABNORMAL HIGH (ref 3.4–4.5)
SODIUM: 141 mmol/L (ref 135–145)

## 2020-07-19 LAB — MAGNESIUM: MAGNESIUM: 1.7 mg/dL (ref 1.6–2.6)

## 2020-07-19 LAB — TACROLIMUS LEVEL, TROUGH: TACROLIMUS, TROUGH: 5.6 ng/mL (ref 5.0–15.0)

## 2020-07-19 LAB — SLIDE REVIEW

## 2020-07-20 LAB — CMV DNA, QUANTITATIVE, PCR
CMV QUANT: <50 [IU]/mL — ABNORMAL HIGH (ref <0)
CMV VIRAL LD: DETECTED — AB

## 2020-07-22 MED ORDER — ATORVASTATIN 40 MG TABLET
ORAL_TABLET | Freq: Every day | ORAL | 3 refills | 90 days | Status: CP
Start: 2020-07-22 — End: 2021-07-22
  Filled 2020-07-26: qty 90, 90d supply, fill #0

## 2020-07-22 MED ORDER — AMLODIPINE 10 MG TABLET
ORAL_TABLET | Freq: Every day | ORAL | 11 refills | 30 days | Status: CP
Start: 2020-07-22 — End: 2021-07-22
  Filled 2020-07-26: qty 30, 30d supply, fill #0

## 2020-07-22 NOTE — Unmapped (Signed)
[facility] Refill Coordination Note    Specialty Medication(s) to be Shipped:   Transplant: Envarsus 4mg  and valgancyclovir 450mg     Other medication(s) to be shipped: amlodipine     Frank Leach, DOB: 07-04-1951  Phone: [phone] (home)       All above HIPAA information was verified with patient.     Was a Nurse, learning disability used for this call? No    Completed refill call assessment today to schedule patient's medication shipment from the [facility] [phone]).       Specialty medication(s) and dose(s) confirmed: Regimen is correct and unchanged.   Changes to medications: Frank Leach reports no changes at this time.  Changes to insurance: No  Questions for the pharmacist: No    Confirmed patient received Welcome Packet with first shipment. The patient will receive a drug information handout for each medication shipped and additional FDA Medication Guides as required.       DISEASE/MEDICATION-SPECIFIC INFORMATION        N/A    SPECIALTY MEDICATION ADHERENCE     Medication Adherence    Patient reported X missed doses in the last month: 0          Envarsus 4mg : 8 days worth of medication on hand.  Valganciclovir: 8 days worth of medication on hand.            SHIPPING     Shipping address confirmed in Epic.     Delivery Scheduled: Yes, Expected medication delivery date: 07/27/20.     Medication will be delivered via UPS to the prescription address in Epic WAM.    Frank Leach   [facility] Specialty Technician

## 2020-07-22 NOTE — Unmapped (Signed)
Pt request for RX Refill amLODIPine (NORVASC) 10 MG tablet

## 2020-07-22 NOTE — Unmapped (Signed)
Pt request for RX Refill    atorvastatin (LIPITOR) 40 MG tablet

## 2020-07-26 MED FILL — ATORVASTATIN 40 MG TABLET: 90 days supply | Qty: 90 | Fill #0 | Status: AC

## 2020-07-26 MED FILL — ENVARSUS XR 4 MG TABLET,EXTENDED RELEASE: 30 days supply | Qty: 30 | Fill #0 | Status: AC

## 2020-07-26 MED FILL — AMLODIPINE 10 MG TABLET: 30 days supply | Qty: 30 | Fill #0 | Status: AC

## 2020-07-26 MED FILL — ENVARSUS XR 4 MG TABLET,EXTENDED RELEASE: 30 days supply | Qty: 30 | Fill #0

## 2020-07-26 MED FILL — VALGANCICLOVIR 450 MG TABLET: 30 days supply | Qty: 60 | Fill #1 | Status: AC

## 2020-07-26 MED FILL — VALGANCICLOVIR 450 MG TABLET: ORAL | 30 days supply | Qty: 60 | Fill #1

## 2020-08-02 ENCOUNTER — Ambulatory Visit: Admit: 2020-08-02 | Discharge: 2020-08-03 | Payer: MEDICARE

## 2020-08-02 DIAGNOSIS — Z940 Kidney transplant status: Principal | ICD-10-CM

## 2020-08-02 NOTE — Unmapped (Signed)
Spoke with [name] about his Mycophenolate dose today.  He confirmed that he is taking 1 in the morning and 1 in the evening.  He said he will be out of medication on Thursday this week.   He also requested hydralazine to be refilled.      [facility] Refill Coordination Note    Specialty Medication(s) to be Shipped:   Transplant:  mycophenolic acid 180mg     Other medication(s) to be shipped: hydralazine     Frank Leach, DOB: 1951-04-14  Phone: [phone] (home)       All above HIPAA information was verified with patient.     Was a Nurse, learning disability used for this call? No    Completed refill call assessment today to schedule patient's medication shipment from the [facility] [phone]).       Specialty medication(s) and dose(s) confirmed: Regimen is correct and unchanged.   Changes to medications: Frank Leach reports no changes at this time.  Changes to insurance: No  Questions for the pharmacist: No    Confirmed patient received Welcome Packet with first shipment. The patient will receive a drug information handout for each medication shipped and additional FDA Medication Guides as required.       DISEASE/MEDICATION-SPECIFIC INFORMATION        N/A    SPECIALTY MEDICATION ADHERENCE     Medication Adherence    Patient reported X missed doses in the last month: 0  Specialty Medication: Mycophenolate 180mg   Patient is on additional specialty medications: No                Mycophenolate 180 mg: 3 days of medicine on hand         SHIPPING     Shipping address confirmed in Epic.     Delivery Scheduled: Yes, Expected medication delivery date: 08/04/20.     Medication will be delivered via Same Day Courier to the prescription address in Epic WAM.    Frank Leach   [facility] Specialty Pharmacist

## 2020-08-04 MED FILL — MYCOPHENOLATE SODIUM 180 MG TABLET,DELAYED RELEASE: 30 days supply | Qty: 60 | Fill #1 | Status: AC

## 2020-08-04 MED FILL — HYDRALAZINE 25 MG TABLET: ORAL | 30 days supply | Qty: 60 | Fill #1

## 2020-08-04 MED FILL — MYCOPHENOLATE SODIUM 180 MG TABLET,DELAYED RELEASE: ORAL | 30 days supply | Qty: 60 | Fill #1

## 2020-08-04 MED FILL — HYDRALAZINE 25 MG TABLET: 30 days supply | Qty: 60 | Fill #1 | Status: AC

## 2020-08-10 ENCOUNTER — Ambulatory Visit: Admit: 2020-08-10 | Discharge: 2020-08-11 | Payer: MEDICARE

## 2020-08-10 DIAGNOSIS — B259 Cytomegaloviral disease, unspecified: Principal | ICD-10-CM

## 2020-08-10 DIAGNOSIS — D849 Immunodeficiency, unspecified: Principal | ICD-10-CM

## 2020-08-10 DIAGNOSIS — Z940 Kidney transplant status: Principal | ICD-10-CM

## 2020-08-10 DIAGNOSIS — T8610 Unspecified complication of kidney transplant: Principal | ICD-10-CM

## 2020-08-10 DIAGNOSIS — Z79899 Other long term (current) drug therapy: Principal | ICD-10-CM

## 2020-08-10 LAB — CBC W/ AUTO DIFF
BASOPHILS ABSOLUTE COUNT: 0.1 10*9/L (ref 0.0–0.1)
BASOPHILS RELATIVE PERCENT: 1.3 %
EOSINOPHILS ABSOLUTE COUNT: 0.1 10*9/L (ref 0.0–0.7)
EOSINOPHILS RELATIVE PERCENT: 2.6 %
HEMATOCRIT: 40.2 % (ref 38.0–50.0)
HEMOGLOBIN: 12.9 g/dL — ABNORMAL LOW (ref 13.5–17.5)
LYMPHOCYTES ABSOLUTE COUNT: 0.6 10*9/L — ABNORMAL LOW (ref 0.7–4.0)
LYMPHOCYTES RELATIVE PERCENT: 10.2 %
MEAN CORPUSCULAR HEMOGLOBIN CONC: 32.1 g/dL (ref 30.0–36.0)
MEAN CORPUSCULAR HEMOGLOBIN: 31.5 pg (ref 26.0–34.0)
MEAN CORPUSCULAR VOLUME: 98 fL — ABNORMAL HIGH (ref 81.0–95.0)
MEAN PLATELET VOLUME: 8 fL (ref 7.0–10.0)
MONOCYTES ABSOLUTE COUNT: 0.3 10*9/L (ref 0.1–1.0)
MONOCYTES RELATIVE PERCENT: 5.2 %
NEUTROPHILS ABSOLUTE COUNT: 4.5 10*9/L (ref 1.7–7.7)
NEUTROPHILS RELATIVE PERCENT: 80.7 %
NUCLEATED RED BLOOD CELLS: 0 /100{WBCs} (ref <=4)
PLATELET COUNT: 218 10*9/L (ref 150–450)
RED BLOOD CELL COUNT: 4.11 10*12/L — ABNORMAL LOW (ref 4.32–5.72)
RED CELL DISTRIBUTION WIDTH: 15.8 % — ABNORMAL HIGH (ref 12.0–15.0)
WBC ADJUSTED: 5.5 10*9/L (ref 3.5–10.5)

## 2020-08-10 LAB — URINALYSIS
BACTERIA: NONE SEEN /HPF
BILIRUBIN UA: NEGATIVE
BLOOD UA: NEGATIVE
GLUCOSE UA: NEGATIVE
KETONES UA: NEGATIVE
LEUKOCYTE ESTERASE UA: NEGATIVE
NITRITE UA: NEGATIVE
PH UA: 5.5 (ref 5.0–9.0)
PROTEIN UA: 100 — AB
RBC UA: 2 /HPF (ref <3)
SPECIFIC GRAVITY UA: 1.025 (ref 1.005–1.040)
SQUAMOUS EPITHELIAL: 1 /HPF (ref 0–5)
UROBILINOGEN UA: 0.2
WBC UA: 11 /HPF — ABNORMAL HIGH (ref <2)

## 2020-08-10 LAB — TACROLIMUS LEVEL, TROUGH: TACROLIMUS, TROUGH: 8 ng/mL (ref 5.0–15.0)

## 2020-08-10 LAB — PROTEIN / CREATININE RATIO, URINE
CREATININE, URINE: 152 mg/dL
PROTEIN URINE: 139.2 mg/dL
PROTEIN/CREAT RATIO, URINE: 0.916

## 2020-08-10 LAB — RENAL FUNCTION PANEL
ALBUMIN: 4 g/dL (ref 3.4–5.0)
ANION GAP: 5 mmol/L (ref 5–14)
BLOOD UREA NITROGEN: 23 mg/dL (ref 9–23)
BUN / CREAT RATIO: 16
CALCIUM: 10.1 mg/dL (ref 8.7–10.4)
CHLORIDE: 113 mmol/L — ABNORMAL HIGH (ref 98–107)
CO2: 24 mmol/L (ref 20.0–31.0)
CREATININE: 1.47 mg/dL — ABNORMAL HIGH
EGFR CKD-EPI AA MALE: 56 mL/min/{1.73_m2} — ABNORMAL LOW (ref >=60)
EGFR CKD-EPI NON-AA MALE: 49 mL/min/{1.73_m2} — ABNORMAL LOW (ref >=60)
GLUCOSE RANDOM: 173 mg/dL (ref 70–179)
PHOSPHORUS: 3.4 mg/dL (ref 2.4–5.1)
POTASSIUM: 4.8 mmol/L — ABNORMAL HIGH (ref 3.4–4.5)
SODIUM: 142 mmol/L (ref 135–145)

## 2020-08-10 LAB — MAGNESIUM: MAGNESIUM: 1.6 mg/dL (ref 1.6–2.6)

## 2020-08-11 DIAGNOSIS — Z940 Kidney transplant status: Principal | ICD-10-CM

## 2020-08-11 DIAGNOSIS — Z1159 Encounter for screening for other viral diseases: Principal | ICD-10-CM

## 2020-08-11 LAB — CMV DNA, QUANTITATIVE, PCR
CMV QUANT LOG10: 1.73 {Log_IU}/mL — ABNORMAL HIGH (ref <0.00)
CMV QUANT: 54 [IU]/mL — ABNORMAL HIGH (ref <0)

## 2020-08-17 NOTE — Unmapped (Signed)
[facility] Refill Coordination Note    Specialty Medication(s) to be Shipped:   Transplant: Envarsus 1mg , Envarsus 4mg  and valgancyclovir 450mg     Other medication(s) to be shipped: No additional medications requested for fill at this time     Frank Leach, DOB: 02-Dec-1950  Phone: [phone] (home)       All above HIPAA information was verified with patient.     Was a Nurse, learning disability used for this call? No    Completed refill call assessment today to schedule patient's medication shipment from the [facility] [phone]).       Specialty medication(s) and dose(s) confirmed: Regimen is correct and unchanged.   Changes to medications: [name] reports no changes at this time.  Changes to insurance: No  Questions for the pharmacist: No    Confirmed patient received Welcome Packet with first shipment. The patient will receive a drug information handout for each medication shipped and additional FDA Medication Guides as required.       DISEASE/MEDICATION-SPECIFIC INFORMATION        N/A    SPECIALTY MEDICATION ADHERENCE     Medication Adherence    Patient reported X missed doses in the last month: 0  Specialty Medication: ENVARSUS XR 1MG    Patient is on additional specialty medications: Yes  Additional Specialty Medications: ENVARSUS XR 4MG    Patient Reported Additional Medication X Missed Doses in the Last Month: 0  Patient is on more than two specialty medications: Yes  Specialty Medication: VALGANCICLOVIR 450MG   Patient Reported Additional Medication X Missed Doses in the Last Month: 0  Specialty Medication: MYCOPHENOLATE 180MG    Patient Reported Additional Medication X Missed Doses in the Last Month: 0  Informant: patient  Confirmed plan for next specialty medication refill: delivery by pharmacy  Refills needed for supportive medications: not needed          Refill Coordination    Has the Patients' Contact Information Changed: No  Is the Shipping Address Different: No ENVARSUS XR  1 mg: 8 days of medicine on hand   ENVARSUS XR 4 mg: 8 days of medicine on hand   VALCICLOVIR 450 mg: 8 days of medicine on hand       SHIPPING     Shipping address confirmed in Epic.     Delivery Scheduled: Yes, Expected medication delivery date: 1/19.     Medication will be delivered via UPS to the prescription address in Epic WAM.    Jolene Schimke   [facility] Specialty Technician

## 2020-08-17 NOTE — Unmapped (Signed)
[facility] Refill Coordination Note    Specialty Medication(s) to be Shipped:   Transplant: mycophenolate mofetil 180mg     Other medication(s) to be shipped: HYDRALAZINE AND AMLODIPINE     Frank Leach, DOB: March 29, 1951  Phone: [phone] (home)       All above HIPAA information was verified with patient.     Was a Nurse, learning disability used for this call? No    Completed refill call assessment today to schedule patient's medication shipment from the [facility] [phone]).       Specialty medication(s) and dose(s) confirmed: Regimen is correct and unchanged.   Changes to medications: Frank Leach reports no changes at this time.  Changes to insurance: No  Questions for the pharmacist: No    Confirmed patient received Welcome Packet with first shipment. The patient will receive a drug information handout for each medication shipped and additional FDA Medication Guides as required.       DISEASE/MEDICATION-SPECIFIC INFORMATION        N/A    SPECIALTY MEDICATION ADHERENCE     Medication Adherence    Patient reported X missed doses in the last month: 0  Specialty Medication: MYCOPHENOLATE 180MG  EC   Patient is on additional specialty medications: Yes  Additional Specialty Medications: ENVARSUS XR 1MG    Patient Reported Additional Medication X Missed Doses in the Last Month: 0  Patient is on more than two specialty medications: Yes  Specialty Medication: ENVARSUS XR 4MG    Patient Reported Additional Medication X Missed Doses in the Last Month: 0  Informant: patient  Confirmed plan for next specialty medication refill: delivery by pharmacy  Refills needed for supportive medications: not needed          Refill Coordination    Has the Patients' Contact Information Changed: No  Is the Shipping Address Different: No           MYCOPHENOLATE 180 mg: 14 days of medicine on hand         SHIPPING     Shipping address confirmed in Epic.     Delivery Scheduled: Yes, Expected medication delivery date: 1/26.     Medication will be delivered via UPS to the prescription address in Epic WAM.    Frank Leach   [facility] Specialty Technician

## 2020-08-24 MED FILL — ENVARSUS XR 1 MG TABLET,EXTENDED RELEASE: 30 days supply | Qty: 90 | Fill #1

## 2020-08-24 MED FILL — ENVARSUS XR 4 MG TABLET,EXTENDED RELEASE: 30 days supply | Qty: 30 | Fill #1

## 2020-08-24 MED FILL — VALGANCICLOVIR 450 MG TABLET: ORAL | 30 days supply | Qty: 60 | Fill #2

## 2020-08-31 MED FILL — AMLODIPINE 10 MG TABLET: ORAL | 30 days supply | Qty: 30 | Fill #1

## 2020-08-31 MED FILL — MYCOPHENOLATE SODIUM 180 MG TABLET,DELAYED RELEASE: ORAL | 30 days supply | Qty: 60 | Fill #2

## 2020-08-31 MED FILL — HYDRALAZINE 25 MG TABLET: ORAL | 30 days supply | Qty: 60 | Fill #2

## 2020-09-01 MED ORDER — CARVEDILOL 6.25 MG TABLET
ORAL_TABLET | Freq: Two times a day (BID) | ORAL | 3 refills | 90 days
Start: 2020-09-01 — End: 2021-09-01

## 2020-09-01 NOTE — Unmapped (Signed)
Pt request for RX RefillcarvediloL (COREG) 6.25 MG tablet

## 2020-09-03 MED ORDER — CARVEDILOL 6.25 MG TABLET
ORAL_TABLET | Freq: Two times a day (BID) | ORAL | 3 refills | 90 days | Status: CP
Start: 2020-09-03 — End: 2021-09-03
  Filled 2020-09-03: qty 360, 90d supply, fill #0

## 2020-09-07 ENCOUNTER — Ambulatory Visit: Admit: 2020-09-07 | Discharge: 2020-09-08 | Payer: MEDICARE

## 2020-09-07 DIAGNOSIS — B259 Cytomegaloviral disease, unspecified: Principal | ICD-10-CM

## 2020-09-07 DIAGNOSIS — Z1159 Encounter for screening for other viral diseases: Principal | ICD-10-CM

## 2020-09-07 DIAGNOSIS — Z79899 Other long term (current) drug therapy: Principal | ICD-10-CM

## 2020-09-07 DIAGNOSIS — Z940 Kidney transplant status: Principal | ICD-10-CM

## 2020-09-07 LAB — CBC W/ AUTO DIFF
BASOPHILS ABSOLUTE COUNT: 0.1 10*9/L (ref 0.0–0.1)
BASOPHILS RELATIVE PERCENT: 2.2 %
EOSINOPHILS ABSOLUTE COUNT: 0.1 10*9/L (ref 0.0–0.7)
EOSINOPHILS RELATIVE PERCENT: 3.1 %
HEMATOCRIT: 39.2 % (ref 38.0–50.0)
HEMOGLOBIN: 12.7 g/dL — ABNORMAL LOW (ref 13.5–17.5)
LYMPHOCYTES ABSOLUTE COUNT: 0.5 10*9/L — ABNORMAL LOW (ref 0.7–4.0)
LYMPHOCYTES RELATIVE PERCENT: 12 %
MEAN CORPUSCULAR HEMOGLOBIN CONC: 32.4 g/dL (ref 30.0–36.0)
MEAN CORPUSCULAR HEMOGLOBIN: 32.2 pg (ref 26.0–34.0)
MEAN CORPUSCULAR VOLUME: 99.5 fL — ABNORMAL HIGH (ref 81.0–95.0)
MEAN PLATELET VOLUME: 8 fL (ref 7.0–10.0)
MONOCYTES ABSOLUTE COUNT: 0.3 10*9/L (ref 0.1–1.0)
MONOCYTES RELATIVE PERCENT: 7.1 %
NEUTROPHILS ABSOLUTE COUNT: 3.3 10*9/L (ref 1.7–7.7)
NEUTROPHILS RELATIVE PERCENT: 75.6 %
NUCLEATED RED BLOOD CELLS: 0 /100{WBCs} (ref <=4)
PLATELET COUNT: 184 10*9/L (ref 150–450)
RED BLOOD CELL COUNT: 3.94 10*12/L — ABNORMAL LOW (ref 4.32–5.72)
RED CELL DISTRIBUTION WIDTH: 16.7 % — ABNORMAL HIGH (ref 12.0–15.0)
WBC ADJUSTED: 4.4 10*9/L (ref 3.5–10.5)

## 2020-09-07 LAB — RENAL FUNCTION PANEL
ALBUMIN: 4 g/dL (ref 3.4–5.0)
ANION GAP: 6 mmol/L (ref 5–14)
BLOOD UREA NITROGEN: 21 mg/dL (ref 9–23)
BUN / CREAT RATIO: 13
CALCIUM: 10.2 mg/dL (ref 8.7–10.4)
CHLORIDE: 115 mmol/L — ABNORMAL HIGH (ref 98–107)
CO2: 21.7 mmol/L (ref 20.0–31.0)
CREATININE: 1.61 mg/dL — ABNORMAL HIGH
EGFR CKD-EPI AA MALE: 50 mL/min/{1.73_m2} — ABNORMAL LOW (ref >=60)
EGFR CKD-EPI NON-AA MALE: 44 mL/min/{1.73_m2} — ABNORMAL LOW (ref >=60)
GLUCOSE RANDOM: 159 mg/dL (ref 70–179)
PHOSPHORUS: 3.6 mg/dL (ref 2.4–5.1)
POTASSIUM: 4.9 mmol/L — ABNORMAL HIGH (ref 3.4–4.5)
SODIUM: 143 mmol/L (ref 135–145)

## 2020-09-07 LAB — MAGNESIUM: MAGNESIUM: 1.5 mg/dL — ABNORMAL LOW (ref 1.6–2.6)

## 2020-09-07 LAB — TACROLIMUS LEVEL, TROUGH: TACROLIMUS, TROUGH: 10.1 ng/mL (ref 5.0–15.0)

## 2020-09-08 LAB — CMV DNA, QUANTITATIVE, PCR: CMV VIRAL LD: NOT DETECTED

## 2020-09-08 LAB — HEPATITIS B SURFACE ANTIBODY
HEPATITIS B SURFACE ANTIBODY QUANT: <8 m[IU]/mL (ref <8.00)
HEPATITIS B SURFACE ANTIBODY: NONREACTIVE

## 2020-09-14 NOTE — Unmapped (Signed)
[facility] Clinical Assessment & Refill Coordination Note    Frank Leach, [place]: [birth year]  Phone: [phone] (home)     All above HIPAA information was verified with patient.     Was a Nurse, learning disability used for this call? No    Specialty Medication(s):   Transplant: Envarsus 1mg , Envarsus 4mg ,  mycophenolic acid 180mg  and valgancyclovir 450mg      Current Outpatient Medications   Medication Sig Dispense Refill   ??? acetaminophen (TYLENOL) 500 MG tablet Take 1-2 tablets (500-1,000 mg total) by mouth every six (6) hours as needed for pain or fever (> 38C). 100 tablet 0   ??? allopurinoL (ZYLOPRIM) 100 MG tablet Take 1 tablet (100 mg total) by mouth every evening. 90 tablet 3   ??? amLODIPine (NORVASC) 10 MG tablet Take 1 tablet (10 mg total) by mouth daily. 30 tablet 11   ??? atorvastatin (LIPITOR) 40 MG tablet Take 1 tablet (40 mg total) by mouth daily. 90 tablet 3   ??? carvediloL (COREG) 6.25 MG tablet Take 2 tablets (12.5 mg total) by mouth Two (2) times a day. 360 tablet 3   ??? cholecalciferol, vitamin D3-125 mcg, 5,000 unit,, 125 mcg (5,000 unit) tablet Take 1 tablet (125 mcg total) by mouth daily.     ??? ENVARSUS XR 1 mg Tb24 extended release tablet Take 3 capsules (3 mg) by mouth daily in addition to one 4 mg capsule for a total daily dose of 7 mg 90 tablet 11   ??? ENVARSUS XR 4 mg Tb24 extended release tablet Take 1 tablet (4 mg) by mouth daily in addition to three 1 mg tablets for a total daily dose of  7 mg 30 tablet 11   ??? hydrALAZINE (APRESOLINE) 25 MG tablet Take 1 tablet (25 mg total) by mouth two (2) times a day. 60 tablet 11   ??? magnesium oxide-Mg AA chelate (MAGNESIUM, AMINO ACID CHELATE,) 133 mg Tab Take 1 tablet by mouth two (2) times a day. 100 tablet 11   ??? mycophenolate (MYFORTIC) 180 MG EC tablet Take 1 tablet (180 mg total) by mouth Two (2) times a day. 60 tablet 11   ??? omeprazole (PRILOSEC) 40 MG capsule Take 1 capsule (40 mg total) by mouth every other day. 45 capsule 3   ??? valGANciclovir (VALCYTE) 450 mg tablet Take 1 tablet (450 mg total) by mouth Two (2) times a day. 180 tablet 3     No current facility-administered medications for this visit.        Changes to medications: Aariv reports no changes at this time.    No Known Allergies    Changes to allergies: No    SPECIALTY MEDICATION ADHERENCE     envarsus 1mg   : 9 days of medicine on hand   envarsus 4mg   : 9 days of medicine on hand   valganciclovir 450mg   : 9 days of medicine on hand   Mycophenolate 180mg   : 20 days of medicine on hand       Medication Adherence    Patient reported X missed doses in the last month: 0  Specialty Medication: envarsus 1mg   Patient is on additional specialty medications: Yes  Additional Specialty Medications: envarsus 4mg   Patient Reported Additional Medication X Missed Doses in the Last Month: 0  Patient is on more than two specialty medications: Yes  Specialty Medication: mycophenolate 180mg   Patient Reported Additional Medication X Missed Doses in the Last Month: 0  Specialty Medication: valganciclovir  450mg   Patient Reported Additional Medication X Missed Doses in the Last Month: 0          Specialty medication(s) dose(s) confirmed: Regimen is correct and unchanged.     Are there any concerns with adherence? No    Adherence counseling provided? Not needed    CLINICAL MANAGEMENT AND INTERVENTION      Clinical Benefit Assessment:    Do you feel the medicine is effective or helping your condition? Yes    Clinical Benefit counseling provided? Not needed    Adverse Effects Assessment:    Are you experiencing any side effects? No    Are you experiencing difficulty administering your medicine? No    Quality of Life Assessment:    How many days over the past month did your transplant  keep you from your normal activities? For example, brushing your teeth or getting up in the morning. 0    Have you discussed this with your provider? Not needed    Therapy Appropriateness:    Is therapy appropriate? Yes, therapy is appropriate and should be continued    DISEASE/MEDICATION-SPECIFIC INFORMATION      N/A    PATIENT SPECIFIC NEEDS     - Does the patient have any physical, cognitive, or cultural barriers? No    - Is the patient high risk? Yes, patient is taking a REMS drug. Medication is dispensed in compliance with REMS program    - Does the patient require a Care Management Plan? No     - Does the patient require physician intervention or other additional services (i.e. nutrition, smoking cessation, social work)? No      SHIPPING     Specialty Medication(s) to be Shipped:   Transplant: Envarsus 1mg , Envarsus 4mg  and valgancyclovir 450mg     Other medication(s) to be shipped: no additional medications at this time. pt wants call back next week for all other meds     Changes to insurance: No    Delivery Scheduled: Yes, Expected medication delivery date: 09/21/2020.     Medication will be delivered via UPS to the confirmed prescription address in [facility].    The patient will receive a drug information handout for each medication shipped and additional FDA Medication Guides as required.  Verified that patient has previously received a Conservation officer, historic buildings.    All of the patient's questions and concerns have been addressed.    Thad Ranger   [facility] Specialty Pharmacist

## 2020-09-20 MED FILL — ENVARSUS XR 1 MG TABLET,EXTENDED RELEASE: 30 days supply | Qty: 90 | Fill #2

## 2020-09-20 MED FILL — ENVARSUS XR 4 MG TABLET,EXTENDED RELEASE: 30 days supply | Qty: 30 | Fill #2

## 2020-09-20 MED FILL — VALGANCICLOVIR 450 MG TABLET: ORAL | 30 days supply | Qty: 60 | Fill #3

## 2020-09-20 NOTE — Unmapped (Signed)
[facility] Refill Coordination Note    Specialty Medication(s) to be Shipped:   Inflammatory Disorders: mycophenolate    Other medication(s) to be shipped: hydralazine, amlodipine, allopurinol     Frank Leach, DOB: Aug 23, 1950  Phone: [phone] (home)       All above HIPAA information was verified with patient.     Was a Nurse, learning disability used for this call? No    Completed refill call assessment today to schedule patient's medication shipment from the [facility] [phone]).       Specialty medication(s) and dose(s) confirmed: Regimen is correct and unchanged.   Changes to medications: [name] reports no changes at this time.  Changes to insurance: No  Questions for the pharmacist: No    Confirmed patient received Welcome Packet with first shipment. The patient will receive a drug information handout for each medication shipped and additional FDA Medication Guides as required.       DISEASE/MEDICATION-SPECIFIC INFORMATION        N/A    SPECIALTY MEDICATION ADHERENCE     Medication Adherence    Patient reported X missed doses in the last month: 0  Specialty Medication: mycophenolate  Patient is on additional specialty medications: No  Patient is on more than two specialty medications: No  Any gaps in refill history greater than 2 weeks in the last 3 months: no  Demonstrates understanding of importance of adherence: yes  Informant: patient                Mycophenolate 180mg : Patient has 10 days of medication on hand       SHIPPING     Shipping address confirmed in Epic.     Delivery Scheduled: Yes, Expected medication delivery date: 2/22.     Medication will be delivered via Next Day Courier to the prescription address in Epic W[facility].    [name]   [facility] Specialty Technician

## 2020-09-27 MED FILL — AMLODIPINE 10 MG TABLET: ORAL | 30 days supply | Qty: 30 | Fill #2

## 2020-09-27 MED FILL — HYDRALAZINE 25 MG TABLET: ORAL | 30 days supply | Qty: 60 | Fill #3

## 2020-09-27 MED FILL — MYCOPHENOLATE SODIUM 180 MG TABLET,DELAYED RELEASE: ORAL | 30 days supply | Qty: 60 | Fill #3

## 2020-09-27 MED FILL — ALLOPURINOL 100 MG TABLET: ORAL | 90 days supply | Qty: 90 | Fill #1

## 2020-10-05 ENCOUNTER — Ambulatory Visit: Admit: 2020-10-05 | Discharge: 2020-10-06 | Payer: MEDICARE

## 2020-10-05 LAB — RENAL FUNCTION PANEL
ALBUMIN: 3.9 g/dL (ref 3.4–5.0)
ANION GAP: 6 mmol/L (ref 5–14)
BLOOD UREA NITROGEN: 23 mg/dL (ref 9–23)
BUN / CREAT RATIO: 15
CALCIUM: 10.1 mg/dL (ref 8.7–10.4)
CHLORIDE: 113 mmol/L — ABNORMAL HIGH (ref 98–107)
CO2: 24.5 mmol/L (ref 20.0–31.0)
CREATININE: 1.51 mg/dL — ABNORMAL HIGH
EGFR CKD-EPI AA MALE: 54 mL/min/{1.73_m2} — ABNORMAL LOW (ref >=60)
EGFR CKD-EPI NON-AA MALE: 47 mL/min/{1.73_m2} — ABNORMAL LOW (ref >=60)
GLUCOSE RANDOM: 167 mg/dL (ref 70–179)
PHOSPHORUS: 3.1 mg/dL (ref 2.4–5.1)
POTASSIUM: 4.9 mmol/L — ABNORMAL HIGH (ref 3.4–4.8)
SODIUM: 143 mmol/L (ref 135–145)

## 2020-10-05 LAB — CBC W/ AUTO DIFF
BASOPHILS ABSOLUTE COUNT: 0.1 10*9/L (ref 0.0–0.1)
BASOPHILS RELATIVE PERCENT: 2 %
EOSINOPHILS ABSOLUTE COUNT: 0.1 10*9/L (ref 0.0–0.7)
EOSINOPHILS RELATIVE PERCENT: 2 %
HEMATOCRIT: 40.1 % (ref 38.0–50.0)
HEMOGLOBIN: 13 g/dL — ABNORMAL LOW (ref 13.5–17.5)
LYMPHOCYTES ABSOLUTE COUNT: 0.5 10*9/L — ABNORMAL LOW (ref 0.7–4.0)
LYMPHOCYTES RELATIVE PERCENT: 11.5 %
MEAN CORPUSCULAR HEMOGLOBIN CONC: 32.4 g/dL (ref 30.0–36.0)
MEAN CORPUSCULAR HEMOGLOBIN: 32.4 pg (ref 26.0–34.0)
MEAN CORPUSCULAR VOLUME: 100.1 fL — ABNORMAL HIGH (ref 81.0–95.0)
MEAN PLATELET VOLUME: 7.8 fL (ref 7.0–10.0)
MONOCYTES ABSOLUTE COUNT: 0.3 10*9/L (ref 0.1–1.0)
MONOCYTES RELATIVE PERCENT: 7.2 %
NEUTROPHILS ABSOLUTE COUNT: 3.5 10*9/L (ref 1.7–7.7)
NEUTROPHILS RELATIVE PERCENT: 77.3 %
NUCLEATED RED BLOOD CELLS: 0 /100{WBCs} (ref <=4)
PLATELET COUNT: 188 10*9/L (ref 150–450)
RED BLOOD CELL COUNT: 4 10*12/L — ABNORMAL LOW (ref 4.32–5.72)
RED CELL DISTRIBUTION WIDTH: 16.7 % — ABNORMAL HIGH (ref 12.0–15.0)
WBC ADJUSTED: 4.5 10*9/L (ref 3.5–10.5)

## 2020-10-05 LAB — MAGNESIUM: MAGNESIUM: 1.6 mg/dL (ref 1.6–2.6)

## 2020-10-05 LAB — TACROLIMUS LEVEL, TROUGH: TACROLIMUS, TROUGH: 10.7 ng/mL (ref 5.0–15.0)

## 2020-10-06 LAB — CMV DNA, QUANTITATIVE, PCR
CMV QUANT: <50 [IU]/mL — ABNORMAL HIGH (ref <0)
CMV VIRAL LD: DETECTED — AB

## 2020-10-11 NOTE — Unmapped (Signed)
[facility] Refill Coordination Note    Specialty Medication(s) to be Shipped:   Transplant: Envarsus 1mg , Envarsus 4mg  and valgancyclovir 450mg     Other medication(s) to be shipped: magnesium     Frank Leach, DOB: October 12, 1950  Phone: [phone] (home)       All above HIPAA information was verified with patient.     Was a Nurse, learning disability used for this call? No    Completed refill call assessment today to schedule patient's medication shipment from the [facility] [phone]).       Specialty medication(s) and dose(s) confirmed: Regimen is correct and unchanged.   Changes to medications: Frank Leach reports no changes at this time.  Changes to insurance: No  Questions for the pharmacist: No    Confirmed patient received Welcome Packet with first shipment. The patient will receive a drug information handout for each medication shipped and additional FDA Medication Guides as required.       DISEASE/MEDICATION-SPECIFIC INFORMATION        N/A    SPECIALTY MEDICATION ADHERENCE     Medication Adherence    Patient reported X missed doses in the last month: 0  Specialty Medication: Envarsus 1mg   Patient is on additional specialty medications: Yes  Additional Specialty Medications: Envarsus 4mg   Patient Reported Additional Medication X Missed Doses in the Last Month: 0  Patient is on more than two specialty medications: Yes  Specialty Medication: Valganciclovir 450mg   Patient Reported Additional Medication X Missed Doses in the Last Month: 0        Envarsus 1 mg: 11 days of medicine on hand   Envarsus 4 mg: 11 days of medicine on hand   Valganciclovir 450 mg: 11 days of medicine on hand     SHIPPING     Shipping address confirmed in Epic.     Delivery Scheduled: Yes, Expected medication delivery date: 10/20/2020.     Medication will be delivered via UPS to the prescription address in Epic W[facility].    Lorelei Pont [facility] Specialty Technician

## 2020-10-18 DIAGNOSIS — Z79899 Other long term (current) drug therapy: Principal | ICD-10-CM

## 2020-10-18 DIAGNOSIS — Z940 Kidney transplant status: Principal | ICD-10-CM

## 2020-10-19 MED FILL — MG-PLUS-PROTEIN 133 MG TABLET: ORAL | 50 days supply | Qty: 100 | Fill #0

## 2020-10-19 MED FILL — ENVARSUS XR 1 MG TABLET,EXTENDED RELEASE: 30 days supply | Qty: 90 | Fill #3

## 2020-10-19 MED FILL — ENVARSUS XR 4 MG TABLET,EXTENDED RELEASE: 30 days supply | Qty: 30 | Fill #3

## 2020-10-19 NOTE — Unmapped (Addendum)
[facility] TRANS[facility]IC PHARMACY NOTE  10/20/2020   Frank Leach  [id number]    Medication changes today:   1.  Start olmesartan 20 mg daily  2. Decrease carvedilol to 6.25 mg BID  3. Stop hydralazine  4. Increase myfortic to 360 mg BID  5. Decrease Envarsus to 5 mg daily  6. Decrease Valcyte to 450 mg daily  7. COVID booster shot (#4) given in clinic  8. Start Metformin XR 1000 mg daily   9. Start Jardiance 25 mg daily    Education/Adherence tools provided today:  1.provided updated medication list  2. provided additional education on immunosuppression and transplant related medications including reviewing indications of medications, dosing and side effects    Follow up items:  1. goal of understanding indications and dosing of immunosuppression medications   2. CMV VL, CMV T cell immunity assay, CMV resistance testing  3. Potassium   4. BP  5. BG - start oral agent (metformin vs SGLT2)    Next visit with pharmacy in PRN  ____________________________________________________________________    Frank Leach is a 70 y.o. 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     Other PMH significant for diabetes (diet controlled pre transplant), gout, HTN    Seen by pharmacy today for: medication management and pill box fill and adherence education; last seen by pharmacy 5 months ago     CC: Patient has no complaints today.     Vitals:    10/20/20 0829   BP: 171/70   Pulse: 56   Temp: 36 ??C (96.8 ??F)     No Known Allergies    All medications reviewed and updated.     Medication list includes revisions made during today???s encounter    Outpatient Encounter Medications as of 10/20/2020   Medication Sig Dispense Refill   ??? acetaminophen (TYLENOL) 500 MG tablet Take 1-2 tablets (500-1,000 mg total) by mouth every six (6) hours as needed for pain or fever (> 38C). 100 tablet 0   ??? allopurinoL (ZYLOPRIM) 100 MG tablet Take 1 tablet (100 mg total) by mouth every evening. 90 tablet 3   ??? amLODIPine (NORVASC) 10 MG tablet Take 1 tablet (10 mg total) by mouth daily. 30 tablet 11   ??? atorvastatin (LIPITOR) 40 MG tablet Take 1 tablet (40 mg total) by mouth daily. 90 tablet 3   ??? carvediloL (COREG) 6.25 MG tablet Take 2 tablets (12.5 mg total) by mouth Two (2) times a day. 360 tablet 3   ??? cholecalciferol, vitamin D3-125 mcg, 5,000 unit,, 125 mcg (5,000 unit) tablet Take 1 tablet (125 mcg total) by mouth daily.     ??? magnesium oxide-Mg AA chelate (MAGNESIUM, AMINO ACID CHELATE,) 133 mg Tab Take 1 tablet by mouth two (2) times a day. 100 tablet 11   ??? omeprazole (PRILOSEC) 40 MG capsule Take 1 capsule (40 mg total) by mouth every other day. 45 capsule 3   ??? [DISCONTINUED] ENVARSUS XR 1 mg Tb24 extended release tablet Take 3 capsules (3 mg) by mouth daily in addition to one 4 mg capsule for a total daily dose of 7 mg 90 tablet 11   ??? [DISCONTINUED] ENVARSUS XR 4 mg Tb24 extended release tablet Take 1 tablet (4 mg) by mouth daily in addition to three 1 mg tablets for a total daily dose of  7 mg 30 tablet 11   ??? [DISCONTINUED] hydrALAZINE (APRESOLINE) 25 MG tablet Take 1 tablet (25 mg  total) by mouth two (2) times a day. 60 tablet 11   ??? [DISCONTINUED] mycophenolate (MYFORTIC) 180 MG EC tablet Take 1 tablet (180 mg total) by mouth Two (2) times a day. 60 tablet 11   ??? [DISCONTINUED] valGANciclovir (VALCYTE) 450 mg tablet Take 1 tablet (450 mg total) by mouth Two (2) times a day. 180 tablet 3     No facility-administered encounter medications on file as of 10/20/2020.     Induction agent : alemtuzumab    CURRENT IMMUNOSUPPRESSION:   Envarsus 7 mg PO qd   prograf/Envarsus/cyclosporine goal: 6-8   myfortic 180mg  PO BID   steroid free     Patient is tolerating immunosuppression well    IMMUNOSUPPRESSION DRUG LEVELS:  Lab Results   Component Value Date    Tacrolimus, Trough 10.7 10/05/2020    Tacrolimus, Trough 10.1 09/07/2020    Tacrolimus, Trough 8.0 08/10/2020     Envarsus level is accurate 24 hour trough    Graft function: stable  DSA: ntd  Biopsies to date: mild to moderate arteriosclerosis on 0 hour biopsy  UPC: 0.336 on 05/19/20  WBC/ANC:  wnl    Plan: Will decrease Envarsus to 5 mg daily and increase Myfortic to 360 mg BID.Marland Kitchen    OI Prophylaxis:   CMV Status: D+/ R+, moderate risk .   PCP Prophylaxis: bactrim SS 1 tab MWF x 6 months complete  Thrush: completed in hospital    Plan: Continue valganciclovir 450mg  BID. CMV VL results pending. Continue to monitor CMV VLs to determine when can stop valganciclovir.     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   CMV treatment: Valcyte 450 mg BID   Estimated Creatinine Clearance: 43.5 mL/min (A) (based on SCr of 1.52 mg/dL (H)).    Lab Results   Component Value Date    CMV Quant <50 (H) 10/05/2020    CMV Quant 54 (H) 08/10/2020    CMV Quant <50 (H) 07/19/2020    CMV Quant 157 (H) 07/05/2020    CMV Quant 174 (H) 06/28/2020    CMV Quant 108 (H) 06/21/2020     Plan: Decrease Valcyte 450 mg daily. Checking T-cell immunity assay and resistance testing today. Intend to stop Valcyte if possible.    CV Prophylaxis: asa 81 mg   The 10-year ASCVD risk score Denman George DC Jr., et al., 2013) is: 43.8%  Statin therapy: Indicated; currently on atorvastatin 40 mg daily  Plan: Given no history of CAD and patient is ~1 year post-transplant. Continue to monitor     BP: Goal < 140/90. Clinic vitals reported above  Home BP ranges: 130-150/60-80 (most SBPs 130-140s with a few in the 150s). Denies dizziness.   Current meds include: amlodipine 10 mg nightly; carvedilol 12.5 mg BID, hydralazine 25 mg BID  Plan: Stop hydralazine. Decrease carvedilol to 6.25 mg BID. Start olmesartan 20 mg daily. Continue to monitor     Anemia of CKD:  H/H:   Lab Results   Component Value Date    HGB 14.1 10/20/2020     Lab Results   Component Value Date    HCT 41.9 10/20/2020     Iron panel:  Lab Results   Component Value Date    IRON 64 (L) 07/05/2020    TIBC 212.9 07/05/2020    FERRITIN 1,820.0 (H) 04/20/2016     Lab Results   Component Value Date    Iron Saturation (%) 30 07/05/2020    Iron Saturation (%) 28  12/25/2011     Prior ESA use: none post txp  Plan: stable. Continue to monitor.     DM:   Lab Results   Component Value Date    A1C 8.2 (H) 10/20/2020   . Goal A1c < 7  History of Dm? Yes: T2DM pre transplant that was diet controlled  Established with endocrinologist/PCP for BG managment? No  Currently on: none   (Prior was on metformin and Rybelsus, but Rybelsus was too costly so it was switched to glipizide. All medications stopped August 2021 d/t hypoglycemia)  Home BS log: checks FBG daily 110-130  Diet: did not address  Exercise: did not address  Fluid intake: not addressed  Plan: Start Metformin XR 1000 mg daily and Jardiance 25 mg daily.    Electrolytes: WNL  Last needed Kayexalate for a K of 6.3 in June 2021  Meds currently on: mg plus protein 133 mg BID  Plan: Continue to monitor.    GI/BM: pt reports no complaints  Meds currently on: omeprazole 20 mg PRN- has not used  Plan: Continue to monitor    Pain: pt reports no pain  Meds currently on: APAP PRN (not using)  Plan: Continue to monitor    Bone health:   Vitamin D Level: 47.2. Goal > 30.   Last DEXA results:  none available  Current meds include: cholecalciferol 5000 units daily  Plan: Vitamin D level within goal. Continue to monitor.     Women's/Men's Health:  Ramin Zoll is a 70 y.o. male. Patient reports no men's/women's health issues  Plan: Continue to monitor    Gout - last gout flare >2 years ago  Meds currently on: allopurinol 100 mg daily  Plan: continue to monitor    Erectile dysfunction  Meds currently on: papaverine/phentolamine/alprostadil penile injection PRN   Plan: Continue to monitor    Pharmacy preference:  [facility]    Adherence: Patient has poor understanding of medications; was not able to independently identify names/doses of immunosuppressants and OI meds.  Patient does not fill their own pill box on a regular basis at home.  His wife manages meds  Patient brought medication card:yes  Pill box:did not bring  Plan: Encouraged patient to start to learn meds and eventually help with pill box fill; provided moderate adherence counseling/intervention    I spent a total of 20 minutes face to face with the patient delivering clinical care and providing education/counseling.    Patient was reviewed with Dr. Nestor Lewandowsky who was agreement with the stated plan:     During this visit, the following was completed:   BG log data assessment  BP log data assessment  Labs ordered and evaluated  complex treatment plan >1 DS     All questions/concerns were addressed to the patient's satisfaction.  __________________________________________  Olivia Mackie, PharmD, BCTXP, BCPS, CPP  Solid Organ Transplant Clinical Pharmacist Practitioner  Phone: [phone]  Pager: [phone]

## 2020-10-20 ENCOUNTER — Institutional Professional Consult (permissible substitution): Admit: 2020-10-20 | Discharge: 2020-10-20 | Payer: MEDICARE

## 2020-10-20 ENCOUNTER — Ambulatory Visit: Admit: 2020-10-20 | Discharge: 2020-10-20 | Payer: MEDICARE

## 2020-10-20 ENCOUNTER — Ambulatory Visit: Admit: 2020-10-20 | Discharge: 2020-10-20 | Payer: MEDICARE | Attending: Nephrology | Primary: Nephrology

## 2020-10-20 DIAGNOSIS — Z794 Long term (current) use of insulin: Principal | ICD-10-CM

## 2020-10-20 DIAGNOSIS — B259 Cytomegaloviral disease, unspecified: Principal | ICD-10-CM

## 2020-10-20 DIAGNOSIS — E0801 Diabetes mellitus due to underlying condition with hyperosmolarity with coma: Principal | ICD-10-CM

## 2020-10-20 DIAGNOSIS — Z79899 Other long term (current) drug therapy: Principal | ICD-10-CM

## 2020-10-20 DIAGNOSIS — Z940 Kidney transplant status: Principal | ICD-10-CM

## 2020-10-20 DIAGNOSIS — T8613 Kidney transplant infection: Principal | ICD-10-CM

## 2020-10-20 LAB — URINALYSIS
BACTERIA: NONE SEEN /HPF
BILIRUBIN UA: NEGATIVE
BLOOD UA: NEGATIVE
GLUCOSE UA: NEGATIVE
KETONES UA: NEGATIVE
LEUKOCYTE ESTERASE UA: NEGATIVE
NITRITE UA: NEGATIVE
PH UA: 5 (ref 5.0–9.0)
PROTEIN UA: 100 — AB
RBC UA: 10 /HPF — ABNORMAL HIGH (ref <3)
SPECIFIC GRAVITY UA: >=1.03 (ref 1.005–1.030)
SQUAMOUS EPITHELIAL: 2 /HPF (ref 0–5)
UROBILINOGEN UA: 0.2
WBC UA: 1 /HPF (ref <2)

## 2020-10-20 LAB — CBC W/ AUTO DIFF
BASOPHILS ABSOLUTE COUNT: 0 10*9/L (ref 0.0–0.1)
BASOPHILS RELATIVE PERCENT: 0.7 %
EOSINOPHILS ABSOLUTE COUNT: 0.1 10*9/L (ref 0.0–0.5)
EOSINOPHILS RELATIVE PERCENT: 2.8 %
HEMATOCRIT: 41.9 % (ref 39.0–48.0)
HEMOGLOBIN: 14.1 g/dL (ref 12.9–16.5)
LYMPHOCYTES ABSOLUTE COUNT: 0.7 10*9/L — ABNORMAL LOW (ref 1.1–3.6)
LYMPHOCYTES RELATIVE PERCENT: 14.1 %
MEAN CORPUSCULAR HEMOGLOBIN CONC: 33.6 g/dL (ref 32.0–36.0)
MEAN CORPUSCULAR HEMOGLOBIN: 33.3 pg — ABNORMAL HIGH (ref 25.9–32.4)
MEAN CORPUSCULAR VOLUME: 98.9 fL — ABNORMAL HIGH (ref 77.6–95.7)
MEAN PLATELET VOLUME: 8.8 fL (ref 6.8–10.7)
MONOCYTES ABSOLUTE COUNT: 0.3 10*9/L (ref 0.3–0.8)
MONOCYTES RELATIVE PERCENT: 6.2 %
NEUTROPHILS ABSOLUTE COUNT: 3.7 10*9/L (ref 1.8–7.8)
NEUTROPHILS RELATIVE PERCENT: 76.2 %
PLATELET COUNT: 193 10*9/L (ref 150–450)
RED BLOOD CELL COUNT: 4.23 10*12/L — ABNORMAL LOW (ref 4.26–5.60)
RED CELL DISTRIBUTION WIDTH: 16.5 % — ABNORMAL HIGH (ref 12.2–15.2)
WBC ADJUSTED: 4.9 10*9/L (ref 3.6–11.2)

## 2020-10-20 LAB — RENAL FUNCTION PANEL
ALBUMIN: 4.3 g/dL (ref 3.4–5.0)
ANION GAP: 4 mmol/L — ABNORMAL LOW (ref 5–14)
BLOOD UREA NITROGEN: 26 mg/dL — ABNORMAL HIGH (ref 9–23)
BUN / CREAT RATIO: 17
CALCIUM: 10.8 mg/dL — ABNORMAL HIGH (ref 8.7–10.4)
CHLORIDE: 115 mmol/L — ABNORMAL HIGH (ref 98–107)
CO2: 24.4 mmol/L (ref 20.0–31.0)
CREATININE: 1.52 mg/dL — ABNORMAL HIGH
EGFR CKD-EPI AA MALE: 54 mL/min/{1.73_m2} — ABNORMAL LOW (ref >=60)
EGFR CKD-EPI NON-AA MALE: 46 mL/min/{1.73_m2} — ABNORMAL LOW (ref >=60)
GLUCOSE RANDOM: 186 mg/dL — ABNORMAL HIGH (ref 70–179)
PHOSPHORUS: 3.4 mg/dL (ref 2.4–5.1)
POTASSIUM: 4.9 mmol/L — ABNORMAL HIGH (ref 3.4–4.8)
SODIUM: 143 mmol/L (ref 135–145)

## 2020-10-20 LAB — COVID SPIKE IGG
SARS-COV-2 IGG SEMI-QUANT: <1.85 [AU]/ml (ref <13.00)
SARS-COV-2 SPIKE IGG ANTIBODY: NEGATIVE

## 2020-10-20 LAB — PROTEIN / CREATININE RATIO, URINE
CREATININE, URINE: 115.7 mg/dL
PROTEIN URINE: 107.5 mg/dL
PROTEIN/CREAT RATIO, URINE: 0.929

## 2020-10-20 LAB — MAGNESIUM: MAGNESIUM: 1.8 mg/dL (ref 1.6–2.6)

## 2020-10-20 LAB — HEMOGLOBIN A1C
ESTIMATED AVERAGE GLUCOSE: 189 mg/dL
HEMOGLOBIN A1C: 8.2 % — ABNORMAL HIGH (ref 4.8–5.6)

## 2020-10-20 MED ORDER — OLMESARTAN 20 MG TABLET
ORAL_TABLET | Freq: Every day | ORAL | 3 refills | 90.00000 days | Status: CP
Start: 2020-10-20 — End: 2021-10-20
  Filled 2020-10-25: qty 90, 90d supply, fill #0

## 2020-10-20 MED ORDER — ENVARSUS XR 4 MG TABLET,EXTENDED RELEASE
ORAL_TABLET | 11 refills | 0 days
Start: 2020-10-20 — End: ?

## 2020-10-20 MED ORDER — VALGANCICLOVIR 450 MG TABLET
ORAL_TABLET | Freq: Every day | ORAL | 3 refills | 180 days | Status: CP
Start: 2020-10-20 — End: ?
  Filled 2020-10-19: qty 60, 30d supply, fill #4
  Filled 2020-11-11: qty 30, 30d supply, fill #0

## 2020-10-20 MED ORDER — ENVARSUS XR 1 MG TABLET,EXTENDED RELEASE
ORAL_TABLET | 11 refills | 0.00000 days
Start: 2020-10-20 — End: ?

## 2020-10-20 MED ORDER — MYCOPHENOLATE SODIUM 180 MG TABLET,DELAYED RELEASE
ORAL_TABLET | Freq: Two times a day (BID) | ORAL | 11 refills | 15.00000 days | Status: CP
Start: 2020-10-20 — End: ?
  Filled 2020-10-27: qty 60, 15d supply, fill #0

## 2020-10-20 MED ORDER — SITAGLIPTIN 25 MG TABLET
ORAL_TABLET | Freq: Every day | ORAL | 3 refills | 90 days | Status: CP
Start: 2020-10-20 — End: 2020-10-20

## 2020-10-20 MED ORDER — EMPAGLIFLOZIN 25 MG TABLET
ORAL_TABLET | Freq: Every day | ORAL | 3 refills | 90 days | Status: CP
Start: 2020-10-20 — End: ?
  Filled 2020-10-28: qty 30, 30d supply, fill #0

## 2020-10-20 MED ORDER — METFORMIN ER 500 MG TABLET,EXTENDED RELEASE 24 HR
ORAL_TABLET | Freq: Every day | ORAL | 3 refills | 90 days | Status: CP
Start: 2020-10-20 — End: ?
  Filled 2020-10-28: qty 180, 90d supply, fill #0

## 2020-10-20 NOTE — Unmapped (Signed)
Transplant Coordinator, Clinic Visit   Pt seen today by transplant nephrology for follow up, reviewed medications and symptoms.   Met with Cindee Lame and daughter during clinic visit today. PT doing well, no complaints, getting medications with no difficulty.   Pt would like 4the covid vaccine today.   Per Dr. Nestor Lewandowsky:  Decrease Envarsus to 5 mg daily  Continue Valcyte 450 PO daily  Increase Myfortic to 360 mg PO BID  Order for Olmesartan 20 mg PO daily   Discontinue Hydralazine    Assessment  BP: 130's/70's at home   Lightheaded: denies  BG: 130's  Headache: denies  Hand tremors: slight  Numbness/tingling: denies  Fevers: denies  Chills/sweats: denies  Shortness of breath: denies  Chest pain or pressure: denies  Palpitations: denies  Abdominal pain: denies  Heart burn: denies  Nausea/vomiting: denies  Diarrhea/constipation: denies  UTI symptoms: denies  Swelling: some, wears compressions socks and helps with swelling     Good appetite; reports adequate hydration.     Intake: at least 64 oz water per day  Output: good amount    Any new medications? denies  Immunosuppressant last taken: 10/19/20    Immunization status: Covid x 3, flu shot received this season    Functional Score: 100   Normal no complaints; no evidence of  disease.   Employment status is: not working     I spent a total of 10 minutes with Cindee Lame  reviewing medications and symptoms.

## 2020-10-20 NOTE — Unmapped (Signed)
Addended by: Meredith Staggers on: 10/20/2020 01:54 PM     Modules accepted: Orders

## 2020-10-20 NOTE — Unmapped (Unsigned)
Transplant Nephrology Clinic Visit    Subjective/Interval:   Since patient's last visit in the transplant clinic - patient has been doing well in terms of transplant, taking transplant medications regularly, no episodes of rejection and no side effects of medications.     He presents today feeling overall well. Tolerating his immunosuppression therapy well. His B.P is still not well controlled - has readings of >150 systolic. Home readings are slightly better.  Denies tremors. Denies chest pain or tightness, nausea, vomiting, diarrhea, arthralgia, myalgia, cough, fatigue, gout, lower extremity swelling, or dysuria.    Last dose of Envarsus at 9 am yesterday.     Assessment:  70 y.o. 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       Recommendations/Plan:     Allograft Function: DDKT KDPI: 42%06/22/2019  Renal function holding relatively stable with electrolytes and acid base balanced.     Baseline Cr: ~ 1.5  Last Cr:  1.52 Date:  10/20/20   [place]/ BK     neg Date:  06/28/20   DSA     neg Date:  01/19/20  CMV:     <50 Date:  10/20/20  UPC:     0.929 Date:  10/20/20.     Patient asked to continue with strict k restriction due to hyperkalemia     Immunosuppression Management [High Risk Medical Decision Making For Drug Therapy Requiring Intensive Monitoring For Toxicity]:   Takes Envarsus at 9:00 a.m and is on 7 mg. Will decrease to 5 mg and increase Myfortic to 360 mg BID.  Myfortic   Tacro target: 5-7  Tacro last lvl /date: 13.1.   Side effects: tremors.    Blood Pressure Management: Home BPs have been well-controlled. Continue current BP regimen.   At home B.P readings are 138-152/66-79, did not check his pulse.  HR over here is always low 50-56.  Will decrease coreg to 6.25 mg PO BID, add olmesartan 20 mg PO every day and stop hydralazine.    Lipid Management: Last lipid panel on 05/17/2020.  The 10-year ASCVD risk score Denman George DC Jr., et al., 2013) is: 36.3%  Currently taking atorvastatin     Electrolytes: WNL    Infectious Prophylaxis and Monitoring: CMV VL 331 (05/10/20). Valcyte decreased to 450 mg QD - discontinue in a month.    RCC s/p bilateral nephrectomy:   CT RMP 08/29/2018 showed no suspicious enhancing lesions.   Repeat CT findings from 04/07/20 were overall stable.    D.M -not well controlled.  Added Jardiance + metformin today.    Health Maintenance:   Colonoscopy: 04/30/2018 - 1 adenomatous polyp; repeat 5-10 years    Immunizations:   Flu Shot: 05/19/2020  Prevnar 13: 10/30/2013  Pneumovax: 04/24/2016, due 2022  No immunizations till 1 year post transplant.  COVID-19 (Moderna): 09/12/19, 10/14/19; booster received 04/07/20    History of Present Illness    70 y.o. male here for follow up after kidney transplantation.  Patient has ESRD secondary to HTN.  Patient has been on dialysis since 01/25/2012.  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 Patient was admitted for kidney transplant on 06/21/2019.        Transplant History:    Organ Received: Left DDKT, DBD, PHS, KDPI: 42%; 21h cold ischemia  Native Kidney Disease: HTN; cPRA: 0%  Date of Transplant: 06/22/2019  Post-Transplant Course: HD once for high potassium  Prior Transplants: none  Induction: [place]  Date of Ureteral Stent Removal: 07/29/2019  CMV/EBV Status: CMV D+/R+  EBV D+/R+  Rejection Episodes: none  Donor Specific Antibodies: none  Results of Renal Imaging (pre and post):   Pre-Txp 08/29/2018 CT RMP  Sequela of bilateral nephrectomy. Interval decrease in linear soft tissue within the right nephrectomy bed, potentially postsurgical or fat necrosis. Unchanged linear tissue within the left nephrectomy bed. No suspicious enhancing lesions are visualized within the surgical beds    Post-Txp 06/25/2019 (txp kidney only)  The renal transplant was located in the left lower quadrant. Normal size and echogenicity.  No solid masses or calculi. Trace perinephric fluid adjacent to the lower pole of the kidney. Mild pelviectasis  - Perfusion: Using power Doppler, normal perfusion was seen throughout the renal parenchyma.  - Resistive indices in the renal transplant are stable compared with prior examination.  - Main renal artery/iliac artery: Patent. Again noted are 3 renal arteries. Resistive indices within the renal arteries are stable to minimally increased, now at or just above normal limits.  - Main renal vein/iliac vein: Patent    Past Medical History  1. HTN  2. DM II  3. RCC s/p Bilateral nephrectomy (L 04/2016, R 12/2016)  4. GERD  5. HLD  6. 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    Medications  Current Outpatient Medications   Medication Sig Dispense Refill   ??? acetaminophen (TYLENOL) 500 MG tablet Take 1-2 tablets (500-1,000 mg total) by mouth every six (6) hours as needed for pain or fever (> 38C). 100 tablet 0   ??? allopurinoL (ZYLOPRIM) 100 MG tablet Take 1 tablet (100 mg total) by mouth every evening. 90 tablet 3   ??? amLODIPine (NORVASC) 10 MG tablet Take 1 tablet (10 mg total) by mouth daily. 30 tablet 11   ??? atorvastatin (LIPITOR) 40 MG tablet Take 1 tablet (40 mg total) by mouth daily. 90 tablet 3   ??? carvediloL (COREG) 6.25 MG tablet Take 2 tablets (12.5 mg total) by mouth Two (2) times a day. 360 tablet 3   ??? cholecalciferol, vitamin D3-125 mcg, 5,000 unit,, 125 mcg (5,000 unit) tablet Take 1 tablet (125 mcg total) by mouth daily.     ??? ENVARSUS XR 1 mg Tb24 extended release tablet Take 3 capsules (3 mg) by mouth daily in addition to one 4 mg capsule for a total daily dose of 7 mg 90 tablet 11   ??? ENVARSUS XR 4 mg Tb24 extended release tablet Take 1 tablet (4 mg) by mouth daily in addition to three 1 mg tablets for a total daily dose of  7 mg 30 tablet 11   ??? hydrALAZINE (APRESOLINE) 25 MG tablet Take 1 tablet (25 mg total) by mouth two (2) times a day. 60 tablet 11   ??? magnesium oxide-Mg AA chelate (MAGNESIUM, AMINO ACID CHELATE,) 133 mg Tab Take 1 tablet by mouth two (2) times a day. 100 tablet 11   ??? mycophenolate (MYFORTIC) 180 MG EC tablet Take 1 tablet (180 mg total) by mouth Two (2) times a day. 60 tablet 11   ??? omeprazole (PRILOSEC) 40 MG capsule Take 1 capsule (40 mg total) by mouth every other day. 45 capsule 3   ??? valGANciclovir (VALCYTE) 450 mg tablet Take 1 tablet (450 mg total) by mouth Two (2) times a day.  180 tablet 3     No current facility-administered medications for this visit.     Physical Exam:   BP 171/70 (BP Site: R Arm, BP Position: Sitting, BP Cuff Size: Medium)  - Pulse 56  - Temp 36 ??C (96.8 ??F) (Temporal)  - Ht 170.2 cm (5' 7.01)  - Wt 75.8 kg (167 lb)  - BMI 26.15 kg/m??     Nursing note and vitals reviewed.   Constitutional: Oriented to person, place, and time. Appears well-developed and well-nourished. No distress.   HENT: Wearing face mask.  Head: Normocephalic and atraumatic.   Eyes: Right eye exhibits no discharge. Left eye exhibits no discharge. No scleral icterus.   Neck: Normal range of motion. Neck supple.   Cardiovascular: Normal rate and regular rhythm. Exam reveals no gallop and no friction rub. No murmur heard.   Pulmonary/Chest: Effort normal and breath sounds normal. No respiratory distress.   Abdominal: Soft. Non tender  Musculoskeletal: Normal range of motion. No edema and no tenderness.   Neurological: Alert and oriented to person, place, and time.   Skin: Skin is warm and dry. No rash noted. Not diaphoretic. No erythema. No pallor.   Psychiatric: Normal mood and affect. Behavior is normal. Judgment and thought content normal.       Laboratory Data and Imaging reviewed in EPIC    Follow-Up: Return to clinic in 2 months.   Labs: twice a month.    Leeroy Bock, MD Laboratory Data and Imaging reviewed in EPIC    Follow-Up: Return to clinic in 2 months.   Labs: twice a month.    Leeroy Bock, MD

## 2020-10-20 NOTE — Unmapped (Signed)
Pt received  booster [facility]. Observed pt for 15 mins, pt displayed no signs of allergic reactions and no c/o of discomfort. Pt is aware if any changes occur to seek medical attention.

## 2020-10-20 NOTE — Unmapped (Signed)
Clinical Assessment Needed For: Dose Change  Medication: Mycophenolate 180mg  EC tablet  Last Fill Date/Day Supply: 09/27/2020 / 30 days  Copay $27.80  Was previous dose already scheduled to fill: No    Notes to Pharmacist: Metformin $0 copay, Olmesartan $0 copay, Jardiance $135 copay (HCR sent)    Clinical Assessment Needed For: Dose Change  Medication: Valganciclovir 450 mg tablet  Last Fill Date/Day Supply: 10/19/2020 / 30 days  Refill Too Soon until 11/11/2020  Was previous dose already scheduled to fill: No    Notes to Pharmacist: Will re-test on 04/07

## 2020-10-20 NOTE — Unmapped (Signed)
AOBP r arm medium cuff  Average 171/70 (56)  1st 171/71 (57)  2nd 170/68 (54)

## 2020-10-21 LAB — CMV DNA, QUANTITATIVE, PCR
CMV QUANT: <50 [IU]/mL — ABNORMAL HIGH (ref <0)
CMV VIRAL LD: DETECTED — AB

## 2020-10-21 LAB — TACROLIMUS LEVEL, TROUGH: TACROLIMUS, TROUGH: 13.1 ng/mL (ref 5.0–15.0)

## 2020-10-23 LAB — CMV T-CELL IMMUNITY PANEL

## 2020-10-27 NOTE — Unmapped (Signed)
[facility] Refill Coordination Note    Specialty Medication(s) to be Shipped:   Transplant:  mycophenolic acid 180mg     Other medication(s) to be shipped: No additional medications requested for fill at this time     Frank Leach, DOB: 04/07/1951  Phone: [phone] (home)       All above HIPAA information was verified with patient.     Was a Nurse, learning disability used for this call? No    Completed refill call assessment today to schedule patient's medication shipment from the [facility] [phone]).       Specialty medication(s) and dose(s) confirmed: Patient reports changes to the regimen as follows: Patient and pharmacists are aware of dose increase to 2 tabs twice daily   Changes to medications: Demarrio reports no changes at this time.  Changes to insurance: No  Questions for the pharmacist: No    Confirmed patient received a Conservation officer, historic buildings and a Surveyor, mining with first shipment. The patient will receive a drug information handout for each medication shipped and additional FDA Medication Guides as required.       DISEASE/MEDICATION-SPECIFIC INFORMATION        N/A    SPECIALTY MEDICATION ADHERENCE     Medication Adherence    Patient reported X missed doses in the last month: 0  Specialty Medication: Mycophenolate 180mg   Patient is on additional specialty medications: No  Informant: patient                Mycophenolate 180 mg: 2 days of medicine on hand         SHIPPING     Shipping address confirmed in Epic.     Delivery Scheduled: Yes, Expected medication delivery date: 10/28/20.     Medication will be delivered via UPS to the prescription address in Epic WAM.    Frank Leach   [facility] Specialty Technician

## 2020-11-05 NOTE — Unmapped (Signed)
[facility] Refill Coordination Note    Specialty Medication(s) to be Shipped:   Transplant: mycophenolate mofetil 180mg  and valgancyclovir 450mg     Other medication(s) to be shipped: amlodipine and atorvastatin     Frank Leach, DOB: [birth year]  Phone: [phone] (home)       All above HIPAA information was verified with patient.     Was a Nurse, learning disability used for this call? No    Completed refill call assessment today to schedule patient's medication shipment from the [facility] [phone]).       Specialty medication(s) and dose(s) confirmed: Patient reports changes to the regimen as follows: Valganciclovir - Take 1 tablet (450 mg total) by mouth daily. **new rx is on profile**   Changes to medications: Frank Leach reports no changes at this time.  Changes to insurance: No  Questions for the pharmacist: No    Confirmed patient received a Conservation officer, historic buildings and a Surveyor, mining with first shipment. The patient will receive a drug information handout for each medication shipped and additional FDA Medication Guides as required.       DISEASE/MEDICATION-SPECIFIC INFORMATION        N/A    SPECIALTY MEDICATION ADHERENCE     Medication Adherence    Patient reported X missed doses in the last month: 0  Specialty Medication: Mycophenolate 180mg   Patient is on additional specialty medications: Yes  Additional Specialty Medications: Valganciclovir 450mg   Patient Reported Additional Medication X Missed Doses in the Last Month: 0  Patient is on more than two specialty medications: No        Mycophenolate 180 mg: 8 days of medicine on hand   Valganciclovir 450 mg: 14 days of medicine on hand     SHIPPING     Shipping address confirmed in Epic.     Delivery Scheduled: Yes, Expected medication delivery date: 11/12/2020.     Medication will be delivered via UPS to the prescription address in Epic WAM.    Lorelei Pont [facility] Specialty Technician

## 2020-11-08 ENCOUNTER — Ambulatory Visit: Admit: 2020-11-08 | Discharge: 2020-11-09 | Payer: MEDICARE

## 2020-11-08 LAB — TACROLIMUS LEVEL, TROUGH: TACROLIMUS, TROUGH: 8.6 ng/mL (ref 5.0–15.0)

## 2020-11-08 LAB — CBC W/ AUTO DIFF
BASOPHILS ABSOLUTE COUNT: 0.1 10*9/L (ref 0.0–0.1)
BASOPHILS RELATIVE PERCENT: 2 %
EOSINOPHILS ABSOLUTE COUNT: 0.1 10*9/L (ref 0.0–0.5)
EOSINOPHILS RELATIVE PERCENT: 2.3 %
HEMATOCRIT: 41 % (ref 39.0–48.0)
HEMOGLOBIN: 13.2 g/dL (ref 12.9–16.5)
LYMPHOCYTES ABSOLUTE COUNT: 0.7 10*9/L — ABNORMAL LOW (ref 1.1–3.6)
LYMPHOCYTES RELATIVE PERCENT: 14.3 %
MEAN CORPUSCULAR HEMOGLOBIN CONC: 32.3 g/dL (ref 32.0–36.0)
MEAN CORPUSCULAR HEMOGLOBIN: 32.7 pg — ABNORMAL HIGH (ref 25.9–32.4)
MEAN CORPUSCULAR VOLUME: 101.2 fL — ABNORMAL HIGH (ref 77.6–95.7)
MEAN PLATELET VOLUME: 7.9 fL (ref 6.8–10.7)
MONOCYTES ABSOLUTE COUNT: 0.7 10*9/L (ref 0.3–0.8)
MONOCYTES RELATIVE PERCENT: 13.6 %
NEUTROPHILS ABSOLUTE COUNT: 3.5 10*9/L (ref 1.8–7.8)
NEUTROPHILS RELATIVE PERCENT: 67.8 %
NUCLEATED RED BLOOD CELLS: 0 /100{WBCs} (ref <=4)
PLATELET COUNT: 200 10*9/L (ref 150–450)
RED BLOOD CELL COUNT: 4.05 10*12/L — ABNORMAL LOW (ref 4.26–5.60)
RED CELL DISTRIBUTION WIDTH: 15.7 % — ABNORMAL HIGH (ref 12.2–15.2)
WBC ADJUSTED: 5.1 10*9/L (ref 3.6–11.2)

## 2020-11-08 LAB — MAGNESIUM: MAGNESIUM: 1.8 mg/dL (ref 1.6–2.6)

## 2020-11-08 LAB — RENAL FUNCTION PANEL
ALBUMIN: 4 g/dL (ref 3.4–5.0)
ANION GAP: 5 mmol/L (ref 5–14)
BLOOD UREA NITROGEN: 26 mg/dL — ABNORMAL HIGH (ref 9–23)
BUN / CREAT RATIO: 16
CALCIUM: 9.9 mg/dL (ref 8.7–10.4)
CHLORIDE: 116 mmol/L — ABNORMAL HIGH (ref 98–107)
CO2: 21.1 mmol/L (ref 20.0–31.0)
CREATININE: 1.58 mg/dL — ABNORMAL HIGH
EGFR CKD-EPI AA MALE: 51 mL/min/{1.73_m2} — ABNORMAL LOW (ref >=60)
EGFR CKD-EPI NON-AA MALE: 44 mL/min/{1.73_m2} — ABNORMAL LOW (ref >=60)
GLUCOSE RANDOM: 138 mg/dL (ref 70–179)
PHOSPHORUS: 3.1 mg/dL (ref 2.4–5.1)
POTASSIUM: 5 mmol/L — ABNORMAL HIGH (ref 3.4–4.8)
SODIUM: 142 mmol/L (ref 135–145)

## 2020-11-09 LAB — CMV DNA, QUANTITATIVE, PCR
CMV QUANT LOG10: 1.77 {Log_IU}/mL — ABNORMAL HIGH (ref <0.00)
CMV QUANT: 59 [IU]/mL — ABNORMAL HIGH (ref <0)
CMV VIRAL LD: DETECTED — AB

## 2020-11-11 MED FILL — ATORVASTATIN 40 MG TABLET: ORAL | 90 days supply | Qty: 90 | Fill #1

## 2020-11-11 MED FILL — AMLODIPINE 10 MG TABLET: ORAL | 30 days supply | Qty: 30 | Fill #3

## 2020-11-11 MED FILL — MYCOPHENOLATE SODIUM 180 MG TABLET,DELAYED RELEASE: ORAL | 30 days supply | Qty: 120 | Fill #1

## 2020-11-11 NOTE — Unmapped (Signed)
Therapy Update Follow Up: No issues - Copay = $47

## 2020-11-19 MED FILL — JARDIANCE 25 MG TABLET: ORAL | 30 days supply | Qty: 30 | Fill #1

## 2020-12-06 ENCOUNTER — Ambulatory Visit: Admit: 2020-12-06 | Discharge: 2020-12-07 | Payer: MEDICARE

## 2020-12-06 LAB — CBC W/ AUTO DIFF
BASOPHILS ABSOLUTE COUNT: 0.2 10*9/L — ABNORMAL HIGH (ref 0.0–0.1)
BASOPHILS RELATIVE PERCENT: 3.2 %
EOSINOPHILS ABSOLUTE COUNT: 0.2 10*9/L (ref 0.0–0.5)
EOSINOPHILS RELATIVE PERCENT: 2.7 %
HEMATOCRIT: 40.1 % (ref 39.0–48.0)
HEMOGLOBIN: 12.9 g/dL (ref 12.9–16.5)
LYMPHOCYTES ABSOLUTE COUNT: 0.7 10*9/L — ABNORMAL LOW (ref 1.1–3.6)
LYMPHOCYTES RELATIVE PERCENT: 12.7 %
MEAN CORPUSCULAR HEMOGLOBIN CONC: 32.2 g/dL (ref 32.0–36.0)
MEAN CORPUSCULAR HEMOGLOBIN: 32.4 pg (ref 25.9–32.4)
MEAN CORPUSCULAR VOLUME: 100.6 fL — ABNORMAL HIGH (ref 77.6–95.7)
MEAN PLATELET VOLUME: 7.9 fL (ref 6.8–10.7)
MONOCYTES ABSOLUTE COUNT: 0.6 10*9/L (ref 0.3–0.8)
MONOCYTES RELATIVE PERCENT: 11.2 %
NEUTROPHILS ABSOLUTE COUNT: 4 10*9/L (ref 1.8–7.8)
NEUTROPHILS RELATIVE PERCENT: 70.2 %
NUCLEATED RED BLOOD CELLS: 0 /100{WBCs} (ref <=4)
PLATELET COUNT: 178 10*9/L (ref 150–450)
RED BLOOD CELL COUNT: 3.98 10*12/L — ABNORMAL LOW (ref 4.26–5.60)
RED CELL DISTRIBUTION WIDTH: 14.7 % (ref 12.2–15.2)
WBC ADJUSTED: 5.7 10*9/L (ref 3.6–11.2)

## 2020-12-06 LAB — RENAL FUNCTION PANEL
ALBUMIN: 4 g/dL (ref 3.4–5.0)
ANION GAP: 7 mmol/L (ref 5–14)
BLOOD UREA NITROGEN: 23 mg/dL (ref 9–23)
BUN / CREAT RATIO: 15
CALCIUM: 10.1 mg/dL (ref 8.7–10.4)
CHLORIDE: 114 mmol/L — ABNORMAL HIGH (ref 98–107)
CO2: 23.9 mmol/L (ref 20.0–31.0)
CREATININE: 1.55 mg/dL — ABNORMAL HIGH
EGFR CKD-EPI AA MALE: 52 mL/min/{1.73_m2} — ABNORMAL LOW (ref >=60)
EGFR CKD-EPI NON-AA MALE: 45 mL/min/{1.73_m2} — ABNORMAL LOW (ref >=60)
GLUCOSE RANDOM: 132 mg/dL — ABNORMAL HIGH (ref 70–99)
PHOSPHORUS: 3.5 mg/dL (ref 2.4–5.1)
POTASSIUM: 4.8 mmol/L (ref 3.5–5.1)
SODIUM: 145 mmol/L (ref 135–145)

## 2020-12-06 LAB — TACROLIMUS LEVEL, TROUGH: TACROLIMUS, TROUGH: 8.4 ng/mL (ref 5.0–15.0)

## 2020-12-06 LAB — MAGNESIUM: MAGNESIUM: 1.5 mg/dL — ABNORMAL LOW (ref 1.6–2.6)

## 2020-12-07 LAB — CMV DNA, QUANTITATIVE, PCR
CMV QUANT: <50 [IU]/mL — ABNORMAL HIGH (ref <0)
CMV VIRAL LD: DETECTED — AB

## 2020-12-09 DIAGNOSIS — Z940 Kidney transplant status: Principal | ICD-10-CM

## 2020-12-09 MED ORDER — JARDIANCE 10 MG TABLET
ORAL_TABLET | 1 refills | 0 days
Start: 2020-12-09 — End: ?

## 2020-12-09 MED ORDER — ENVARSUS XR 1 MG TABLET,EXTENDED RELEASE
ORAL_TABLET | 11 refills | 0 days
Start: 2020-12-09 — End: ?

## 2020-12-09 MED ORDER — ENVARSUS XR 4 MG TABLET,EXTENDED RELEASE
ORAL_TABLET | 11 refills | 0 days
Start: 2020-12-09 — End: ?

## 2020-12-09 MED FILL — MYCOPHENOLATE SODIUM 180 MG TABLET,DELAYED RELEASE: ORAL | 30 days supply | Qty: 120 | Fill #2

## 2020-12-09 MED FILL — VALGANCICLOVIR 450 MG TABLET: ORAL | 30 days supply | Qty: 30 | Fill #1

## 2020-12-09 MED FILL — AMLODIPINE 10 MG TABLET: ORAL | 30 days supply | Qty: 30 | Fill #4

## 2020-12-09 NOTE — Unmapped (Signed)
[facility] Refill Coordination Note    Specialty Medication(s) to be Shipped:   Inflammatory Disorders: mycophenolate and Valganciclovir 450mg     Other medication(s) to be shipped: amlodipine 10mg      ** pt denied needing refill on [id number] Frank Leach Frank Leach, DOB: Jun 24, 1951  Phone: [phone] (home)       All above HIPAA information was verified with patient.     Was a Nurse, learning disability used for this call? No    Completed refill call assessment today to schedule patient's medication shipment from the [facility] [phone]).  All relevant notes have been reviewed.     Specialty medication(s) and dose(s) confirmed: Regimen is correct and unchanged.   Changes to medications: Kelon reports no changes at this time.  Changes to insurance: No  New side effects reported not previously addressed with a pharmacist or physician: None reported  Questions for the pharmacist: No    Confirmed patient received a Conservation officer, historic buildings and a Surveyor, mining with first shipment. The patient will receive a drug information handout for each medication shipped and additional FDA Medication Guides as required.       DISEASE/MEDICATION-SPECIFIC INFORMATION        N/A    SPECIALTY MEDICATION ADHERENCE     Medication Adherence    Patient reported X missed doses in the last month: 0  Specialty Medication: Mycophenolate 180mg   Patient is on additional specialty medications: Yes  Additional Specialty Medications: Valganciclovir 450mg   Patient Reported Additional Medication X Missed Doses in the Last Month: 0  Patient is on more than two specialty medications: No  Any gaps in refill history greater than 2 weeks in the last 3 months: no  Demonstrates understanding of importance of adherence: yes  Informant: patient  Reliability of informant: reliable  Provider-estimated medication adherence level: good  Patient is at risk for Non-Adherence: No              Were doses missed due to medication being on hold? No    mycophenolate 180 mg: 5 days of medicine on hand   Valganciclovir 450 mg: 5 days of medicine on hand         REFERRAL TO PHARMACIST     Referral to the pharmacist: Not needed      [facility]     Shipping address confirmed in Epic.     Delivery Scheduled: Yes, Expected medication delivery date: 05/06.     Medication will be delivered via Next Day Courier to the prescription address in Epic WAM.    Frank Leach   [facility] Specialty Technician

## 2020-12-10 MED FILL — JARDIANCE 10 MG TABLET: 30 days supply | Qty: 30 | Fill #0

## 2020-12-13 DIAGNOSIS — Z79899 Other long term (current) drug therapy: Principal | ICD-10-CM

## 2020-12-13 DIAGNOSIS — Z940 Kidney transplant status: Principal | ICD-10-CM

## 2020-12-13 MED ORDER — ENVARSUS XR 1 MG TABLET,EXTENDED RELEASE
ORAL_TABLET | Freq: Every day | ORAL | 11 refills | 90 days | Status: CP
Start: 2020-12-13 — End: ?

## 2020-12-13 MED ORDER — ENVARSUS XR 4 MG TABLET,EXTENDED RELEASE
ORAL_TABLET | Freq: Every day | ORAL | 11 refills | 30 days | Status: CP
Start: 2020-12-13 — End: ?

## 2020-12-13 NOTE — Unmapped (Signed)
Returned pt phone call, he reports that he spoke with Dr. Norma Fredrickson a few months ago about having surgery on his fistula and wanted to know what was going on with that.  Looked at previous notes in pt chart, pt wanted to wait on surgery until he was 1 year out and was given phone number to call back when he was ready to proceed.     PT was given phone number again so he could call

## 2020-12-13 NOTE — Unmapped (Addendum)
[facility] Pharmacist has reviewed this new prescription.  Patient was counseled on this dosage change by CPP- see epic note from 10/20/20.  Next refill call date adjusted if necessary.        Clinical Assessment Needed For: Dose Change  Medication: Envarsus XR 1mg  tablet  Last Fill Date/Day Supply: 10/19/2020 / 30 days  Copay $38.48  Was previous dose already scheduled to fill: No    Notes to Pharmacist: N/A    Clinical Assessment Needed For: Dose Change  Medication: Envarsus XR 4mg  tablet  Last Fill Date/Day Supply: 10/19/2020 / 30 days  Copay $153.63  Was previous dose already scheduled to fill: No    Notes to Pharmacist: Per notes, it appears patient has been aware of dose change on these.

## 2021-01-05 ENCOUNTER — Ambulatory Visit: Admit: 2021-01-05 | Discharge: 2021-01-06 | Payer: MEDICARE

## 2021-01-05 LAB — CBC W/ AUTO DIFF
BASOPHILS ABSOLUTE COUNT: 0 10*9/L (ref 0.0–0.1)
BASOPHILS RELATIVE PERCENT: 1.1 %
EOSINOPHILS ABSOLUTE COUNT: 0.2 10*9/L (ref 0.0–0.5)
EOSINOPHILS RELATIVE PERCENT: 3.7 %
HEMATOCRIT: 40.6 % (ref 39.0–48.0)
HEMOGLOBIN: 13.1 g/dL (ref 12.9–16.5)
LYMPHOCYTES ABSOLUTE COUNT: 0.7 10*9/L — ABNORMAL LOW (ref 1.1–3.6)
LYMPHOCYTES RELATIVE PERCENT: 15 %
MEAN CORPUSCULAR HEMOGLOBIN CONC: 32.3 g/dL (ref 32.0–36.0)
MEAN CORPUSCULAR HEMOGLOBIN: 32.2 pg (ref 25.9–32.4)
MEAN CORPUSCULAR VOLUME: 99.8 fL — ABNORMAL HIGH (ref 77.6–95.7)
MEAN PLATELET VOLUME: 8 fL (ref 6.8–10.7)
MONOCYTES ABSOLUTE COUNT: 0.4 10*9/L (ref 0.3–0.8)
MONOCYTES RELATIVE PERCENT: 9.5 %
NEUTROPHILS ABSOLUTE COUNT: 3.2 10*9/L (ref 1.8–7.8)
NEUTROPHILS RELATIVE PERCENT: 70.7 %
NUCLEATED RED BLOOD CELLS: 0 /100{WBCs} (ref <=4)
PLATELET COUNT: 210 10*9/L (ref 150–450)
RED BLOOD CELL COUNT: 4.07 10*12/L — ABNORMAL LOW (ref 4.26–5.60)
RED CELL DISTRIBUTION WIDTH: 14.1 % (ref 12.2–15.2)
WBC ADJUSTED: 4.6 10*9/L (ref 3.6–11.2)

## 2021-01-05 LAB — RENAL FUNCTION PANEL
ALBUMIN: 4.1 g/dL (ref 3.4–5.0)
ANION GAP: 8 mmol/L (ref 5–14)
BLOOD UREA NITROGEN: 32 mg/dL — ABNORMAL HIGH (ref 9–23)
BUN / CREAT RATIO: 20
CALCIUM: 10.1 mg/dL (ref 8.7–10.4)
CHLORIDE: 111 mmol/L — ABNORMAL HIGH (ref 98–107)
CO2: 22.3 mmol/L (ref 20.0–31.0)
CREATININE: 1.6 mg/dL — ABNORMAL HIGH
EGFR CKD-EPI (2021) MALE: 47 mL/min/{1.73_m2} — ABNORMAL LOW (ref >=60)
GLUCOSE RANDOM: 143 mg/dL (ref 70–179)
PHOSPHORUS: 3.4 mg/dL (ref 2.4–5.1)
POTASSIUM: 4.7 mmol/L (ref 3.4–4.8)
SODIUM: 141 mmol/L (ref 135–145)

## 2021-01-05 LAB — MAGNESIUM: MAGNESIUM: 1.7 mg/dL (ref 1.6–2.6)

## 2021-01-05 LAB — TACROLIMUS LEVEL, TROUGH: TACROLIMUS, TROUGH: 5.2 ng/mL (ref 5.0–15.0)

## 2021-01-05 LAB — SLIDE REVIEW

## 2021-01-06 LAB — CMV DNA, QUANTITATIVE, PCR
CMV QUANT: <50 [IU]/mL — ABNORMAL HIGH (ref <0)
CMV VIRAL LD: DETECTED — AB

## 2021-01-10 NOTE — Unmapped (Signed)
[facility] Refill Coordination Note    Specialty Medication(s) to be Shipped:   Transplant: Envarsus XR 1mg , Envarsus XR 4mg  and valgancyclovir 450mg  and General Specialty: mycophenolate 180mg     Other medication(s) to be shipped: Olmesartan 20mg , Carvedilol 6.25mg      Frank Leach, DOB: 12-01-1950  Phone: [phone] (home)       All above HIPAA information was verified with patient's family member, Wife.     Was a Nurse, learning disability used for this call? No    Completed refill call assessment today to schedule patient's medication shipment from the [facility] [phone]).  All relevant notes have been reviewed.     Specialty medication(s) and dose(s) confirmed: Regimen is correct and unchanged.   Changes to medications: Frank Leach reports no changes at this time.  Changes to insurance: No  New side effects reported not previously addressed with a pharmacist or physician: None reported  Questions for the pharmacist: No    Confirmed patient received a Conservation officer, historic buildings and a Surveyor, mining with first shipment. The patient will receive a drug information handout for each medication shipped and additional FDA Medication Guides as required.       DISEASE/MEDICATION-SPECIFIC INFORMATION        N/A    SPECIALTY MEDICATION ADHERENCE     Medication Adherence    Patient reported X missed doses in the last month: 0  Specialty Medication: Envarsus XR 1mg   Patient is on additional specialty medications: Yes  Additional Specialty Medications: Envarsus XR 4mg   Patient Reported Additional Medication X Missed Doses in the Last Month: 0  Patient is on more than two specialty medications: Yes  Specialty Medication: Mycophenolate 180mg   Patient Reported Additional Medication X Missed Doses in the Last Month: 0  Specialty Medication: Valganciclovir 450mg   Patient Reported Additional Medication X Missed Doses in the Last Month: 0              Were doses missed due to medication being on hold? No    Mycophenolate 180 mg: 4 days of medicine on hand   Valganciclovir 450 mg: 4 days of medicine on hand   Envarsus XR 1 mg: 4 days of medicine on hand   Envarsus XR 4 mg: 4 days of medicine on hand       REFERRAL TO PHARMACIST     Referral to the pharmacist: Not needed      SHIPPING     Shipping address confirmed in Epic.     Delivery Scheduled: Yes, Expected medication delivery date: 01/12/21.     Medication will be delivered via Next Day Courier to the prescription address in Epic WAM.    Nancy Nordmann [facility] Specialty Technician

## 2021-01-11 MED FILL — CARVEDILOL 6.25 MG TABLET: ORAL | 90 days supply | Qty: 360 | Fill #1

## 2021-01-11 MED FILL — ENVARSUS XR 4 MG TABLET,EXTENDED RELEASE: ORAL | 30 days supply | Qty: 30 | Fill #0

## 2021-01-11 MED FILL — ENVARSUS XR 1 MG TABLET,EXTENDED RELEASE: ORAL | 90 days supply | Qty: 90 | Fill #0

## 2021-01-11 MED FILL — OLMESARTAN 20 MG TABLET: ORAL | 90 days supply | Qty: 90 | Fill #1

## 2021-01-11 MED FILL — VALGANCICLOVIR 450 MG TABLET: ORAL | 30 days supply | Qty: 30 | Fill #2

## 2021-01-11 MED FILL — MYCOPHENOLATE SODIUM 180 MG TABLET,DELAYED RELEASE: ORAL | 30 days supply | Qty: 120 | Fill #3

## 2021-01-19 MED FILL — METFORMIN ER 500 MG TABLET,EXTENDED RELEASE 24 HR: ORAL | 90 days supply | Qty: 180 | Fill #1

## 2021-01-20 DIAGNOSIS — Z79899 Other long term (current) drug therapy: Principal | ICD-10-CM

## 2021-01-20 DIAGNOSIS — T8613 Kidney transplant infection: Principal | ICD-10-CM

## 2021-01-20 DIAGNOSIS — Z940 Kidney transplant status: Principal | ICD-10-CM

## 2021-01-26 ENCOUNTER — Ambulatory Visit: Admit: 2021-01-26 | Discharge: 2021-01-27 | Payer: MEDICARE | Attending: Nephrology | Primary: Nephrology

## 2021-01-26 ENCOUNTER — Ambulatory Visit: Admit: 2021-01-26 | Discharge: 2021-01-27 | Payer: MEDICARE

## 2021-01-26 DIAGNOSIS — Z79899 Other long term (current) drug therapy: Principal | ICD-10-CM

## 2021-01-26 DIAGNOSIS — Z940 Kidney transplant status: Principal | ICD-10-CM

## 2021-01-26 DIAGNOSIS — B259 Cytomegaloviral disease, unspecified: Principal | ICD-10-CM

## 2021-01-26 DIAGNOSIS — D849 Immunodeficiency, unspecified: Principal | ICD-10-CM

## 2021-01-26 LAB — SLIDE REVIEW

## 2021-01-26 LAB — CBC W/ AUTO DIFF
BASOPHILS ABSOLUTE COUNT: 0.1 10*9/L (ref 0.0–0.1)
BASOPHILS RELATIVE PERCENT: 2.2 %
EOSINOPHILS ABSOLUTE COUNT: 0.2 10*9/L (ref 0.0–0.5)
EOSINOPHILS RELATIVE PERCENT: 6.3 %
HEMATOCRIT: 40.7 % (ref 39.0–48.0)
HEMOGLOBIN: 13.3 g/dL (ref 12.9–16.5)
LYMPHOCYTES ABSOLUTE COUNT: 0.7 10*9/L — ABNORMAL LOW (ref 1.1–3.6)
LYMPHOCYTES RELATIVE PERCENT: 17.9 %
MEAN CORPUSCULAR HEMOGLOBIN CONC: 32.8 g/dL (ref 32.0–36.0)
MEAN CORPUSCULAR HEMOGLOBIN: 31.9 pg (ref 25.9–32.4)
MEAN CORPUSCULAR VOLUME: 97.3 fL — ABNORMAL HIGH (ref 77.6–95.7)
MEAN PLATELET VOLUME: 8.2 fL (ref 6.8–10.7)
MONOCYTES ABSOLUTE COUNT: 0.4 10*9/L (ref 0.3–0.8)
MONOCYTES RELATIVE PERCENT: 11 %
NEUTROPHILS ABSOLUTE COUNT: 2.4 10*9/L (ref 1.8–7.8)
NEUTROPHILS RELATIVE PERCENT: 62.6 %
NUCLEATED RED BLOOD CELLS: 0 /100{WBCs} (ref <=4)
PLATELET COUNT: 192 10*9/L (ref 150–450)
RED BLOOD CELL COUNT: 4.18 10*12/L — ABNORMAL LOW (ref 4.26–5.60)
RED CELL DISTRIBUTION WIDTH: 13.7 % (ref 12.2–15.2)
WBC ADJUSTED: 3.9 10*9/L (ref 3.6–11.2)

## 2021-01-26 LAB — RENAL FUNCTION PANEL
ALBUMIN: 4.2 g/dL (ref 3.4–5.0)
ANION GAP: 7 mmol/L (ref 5–14)
BLOOD UREA NITROGEN: 22 mg/dL (ref 9–23)
BUN / CREAT RATIO: 14
CALCIUM: 10.8 mg/dL — ABNORMAL HIGH (ref 8.7–10.4)
CHLORIDE: 111 mmol/L — ABNORMAL HIGH (ref 98–107)
CO2: 23.8 mmol/L (ref 20.0–31.0)
CREATININE: 1.58 mg/dL — ABNORMAL HIGH
EGFR CKD-EPI (2021) MALE: 47 mL/min/{1.73_m2} — ABNORMAL LOW (ref >=60)
GLUCOSE RANDOM: 138 mg/dL — ABNORMAL HIGH (ref 70–99)
PHOSPHORUS: 3.2 mg/dL (ref 2.4–5.1)
POTASSIUM: 4.7 mmol/L (ref 3.4–4.8)
SODIUM: 142 mmol/L (ref 135–145)

## 2021-01-26 LAB — URINALYSIS
BACTERIA: NONE SEEN /HPF
BILIRUBIN UA: NEGATIVE
BLOOD UA: NEGATIVE
GLUCOSE UA: >1000 — AB
KETONES UA: NEGATIVE
LEUKOCYTE ESTERASE UA: NEGATIVE
NITRITE UA: NEGATIVE
PH UA: 6 (ref 5.0–9.0)
RBC UA: <1 /HPF (ref <3)
SPECIFIC GRAVITY UA: 1.015 (ref 1.005–1.030)
SQUAMOUS EPITHELIAL: 2 /HPF (ref 0–5)
UROBILINOGEN UA: 0.2
WBC UA: <1 /HPF (ref <2)

## 2021-01-26 LAB — PROTEIN / CREATININE RATIO, URINE
CREATININE, URINE: 80.9 mg/dL
PROTEIN URINE: 41.4 mg/dL
PROTEIN/CREAT RATIO, URINE: 0.512

## 2021-01-26 LAB — TACROLIMUS LEVEL, TROUGH: TACROLIMUS, TROUGH: 9.3 ng/mL (ref 5.0–15.0)

## 2021-01-26 LAB — MAGNESIUM: MAGNESIUM: 1.4 mg/dL — ABNORMAL LOW (ref 1.6–2.6)

## 2021-01-26 NOTE — Unmapped (Signed)
Transplant Nephrology Clinic Visit    Subjective/Interval:   Since patient's last visit in the transplant clinic - patient has been doing well in terms of transplant, taking transplant medications regularly, no episodes of rejection and no side effects of medications.    He presents today feeling overall well. Tolerating his immunosuppression therapy well.Denies tremors.   Checking his BG and BP at home. Home B.P is much better at 103-128 systolic.  Last dose of Envarsus at 9 am yesterday.   Denies chest pain or tightness, nausea, vomiting, diarrhea, arthralgia, myalgia, cough, fatigue, gout, lower extremity swelling, or dysuria.    Assessment:  70 y.o. 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       Recommendations/Plan:     Allograft Function: DDKT KDPI: 42%06/22/2019  Renal function holding relatively stable with electrolytes and acid base balanced.     Baseline Cr: ~ 1.5  Last Cr:  1.58 Date:  01/26/21   [place]/ BK     neg Date:  06/28/20   DSA     neg Date:  01/19/20  CMV:     <50 Date:  10/20/20  UPC:     0.929 Date:  10/20/20.     Stop valcyte today and monitor his CMV.    Immunosuppression Management [High Risk Medical Decision Making For Drug Therapy Requiring Intensive Monitoring For Toxicity]:   Takes Envarsus at 9:00 a.m and is on 5 mg and Myfortic 360 mg BID.  Tacro target: 5-7  Side effects: tremors.  Level today is 9.3 - would decrease tac to 4 mg from 5 mg.    Blood Pressure Management: Home BPs have been well-controlled. Continue current BP regimen.   On coreg to 6.25 mg PO BID, add olmesartan 20 mg PO every day..  Decrease Amlodipine to 5 mg with a plan to take him off.    Lipid Management: Last lipid panel on 05/17/2020.  The 10-year ASCVD risk score Denman George DC Jr., et al., 2013) is: 36.3%  Currently taking atorvastatin     Electrolytes: WNL    RCC s/p bilateral nephrectomy:   CT RMP 08/29/2018 showed no suspicious enhancing lesions.   Repeat CT findings from 04/07/20 were overall stable.    D.M -not well controlled.  Added Jardiance + metformin on last visit.  Increased Jardiance to 25 mg today.    Health Maintenance:   Colonoscopy: 04/30/2018 - 1 adenomatous polyp; repeat 5-10 years    Immunizations:   Flu Shot: 05/19/2020  Prevnar 13: 10/30/2013  Pneumovax: 04/24/2016, due 2022  No immunizations till 1 year post transplant.  COVID-19 (Moderna): 09/12/19, 10/14/19; booster received 04/07/20, 10/2020    History of Present Illness    70 y.o. male here for follow up after kidney transplantation.  Patient has ESRD secondary to HTN.  Patient has been on dialysis since 01/25/2012.  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 Patient was admitted for kidney transplant on 06/21/2019.        Transplant History:    Organ Received: Left DDKT, DBD, PHS, KDPI: 42%; 21h cold ischemia  Native Kidney Disease: HTN; cPRA: 0%  Date of Transplant: 06/22/2019  Post-Transplant Course: HD once for high potassium  Prior Transplants: none  Induction: [place]  Date of Ureteral Stent Removal: 07/29/2019  CMV/EBV Status: CMV D+/R+  EBV D+/R+  Rejection Episodes: none  Donor Specific Antibodies: none  Results of Renal  Imaging (pre and post):   Pre-Txp 08/29/2018 CT RMP  Sequela of bilateral nephrectomy. Interval decrease in linear soft tissue within the right nephrectomy bed, potentially postsurgical or fat necrosis. Unchanged linear tissue within the left nephrectomy bed. No suspicious enhancing lesions are visualized within the surgical beds    Post-Txp 06/25/2019 (txp kidney only)  The renal transplant was located in the left lower quadrant. Normal size and echogenicity.  No solid masses or calculi. Trace perinephric fluid adjacent to the lower pole of the kidney. Mild pelviectasis  - Perfusion: Using power Doppler, normal perfusion was seen throughout the renal parenchyma.  - Resistive indices in the renal transplant are stable compared with prior examination.  - Main renal artery/iliac artery: Patent. Again noted are 3 renal arteries. Resistive indices within the renal arteries are stable to minimally increased, now at or just above normal limits.  - Main renal vein/iliac vein: Patent    Past Medical History  1. HTN  2. DM II  3. RCC s/p Bilateral nephrectomy (L 04/2016, R 12/2016)  4. GERD  5. HLD  6. 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    Medications  Current Outpatient Medications   Medication Sig Dispense Refill   ??? acetaminophen (TYLENOL) 500 MG tablet Take 1-2 tablets (500-1,000 mg total) by mouth every six (6) hours as needed for pain or fever (> 38C). 100 tablet 0   ??? allopurinoL (ZYLOPRIM) 100 MG tablet Take 1 tablet (100 mg total) by mouth every evening. 90 tablet 3   ??? amLODIPine (NORVASC) 10 MG tablet Take 1 tablet (10 mg total) by mouth daily. 30 tablet 11   ??? atorvastatin (LIPITOR) 40 MG tablet Take 1 tablet (40 mg total) by mouth daily. 90 tablet 3   ??? carvediloL (COREG) 6.25 MG tablet Take 2 tablets (12.5 mg total) by mouth Two (2) times a day. 360 tablet 3   ??? cholecalciferol, vitamin D3-125 mcg, 5,000 unit,, 125 mcg (5,000 unit) tablet Take 1 tablet (125 mcg total) by mouth daily.     ??? empagliflozin (JARDIANCE) 10 mg tablet Take 1 tablet once daily 90 tablet 1   ??? empagliflozin (JARDIANCE) 25 mg tablet Take 1 tablet (25 mg total) by mouth daily. 90 tablet 3   ??? ENVARSUS XR 1 mg Tb24 extended release tablet Take 1 tablet (1 mg) by mouth daily in addition to one 4 mg tablet  for a total daily dose of 5 mg 90 tablet 11   ??? ENVARSUS XR 4 mg Tb24 extended release tablet Take 1 tablet (4 mg) by mouth daily in addition to one 1 mg tablet for a total daily dose of  5 mg 30 tablet 11   ??? magnesium oxide-Mg AA chelate (MAGNESIUM, AMINO ACID CHELATE,) 133 mg Tab Take 1 tablet by mouth two (2) times a day. 100 tablet 11   ??? metFORMIN (GLUCOPHAGE-XR) 500 MG 24 hr tablet Take 2 tablets (1,000 mg total) by mouth daily. 180 tablet 3   ??? mycophenolate (MYFORTIC) 180 MG EC tablet Take 2 tablets (360 mg total) by mouth Two (2) times a day. 60 tablet 11   ??? olmesartan (BENICAR) 20 MG tablet Take 1 tablet (20 mg total) by mouth daily. 90 tablet 3   ??? omeprazole (PRILOSEC) 40 MG capsule Take 1 capsule (40 mg total) by mouth every other day. 45 capsule 3   ???  valGANciclovir (VALCYTE) 450 mg tablet Take 1 tablet (450 mg total) by mouth daily. 180 tablet 3     No current facility-administered medications for this visit.     Physical Exam:   BP 155/67 (BP Site: R Arm, BP Position: Sitting, BP Cuff Size: Large)  - Pulse 60  - Temp 35.8 ??C (96.4 ??F) (Temporal)  - Ht 170.2 cm (5' 7.01)  - Wt 73.9 kg (163 lb)  - BMI 25.52 kg/m??     Nursing note and vitals reviewed.   Constitutional: Oriented to person, place, and time. Appears well-developed and well-nourished. No distress.   HENT: Wearing face mask.  Head: Normocephalic and atraumatic.   Eyes: Right eye exhibits no discharge. Left eye exhibits no discharge. No scleral icterus.   Neck: Normal range of motion. Neck supple.   Cardiovascular: Normal rate and regular rhythm. Exam reveals no gallop and no friction rub. No murmur heard.   Pulmonary/Chest: Effort normal and breath sounds normal. No respiratory distress.   Abdominal: Soft. Non tender  Musculoskeletal: Normal range of motion. No edema and no tenderness.   Neurological: Alert and oriented to person, place, and time.   Skin: Skin is warm and dry. No rash noted. Not diaphoretic. No erythema. No pallor.   Psychiatric: Normal mood and affect. Behavior is normal. Judgment and thought content normal.       Laboratory Data and Imaging reviewed in EPIC    Follow-Up: Return to clinic in 3 months.   Labs: once a month.    Leeroy Bock, MD

## 2021-01-26 NOTE — Unmapped (Signed)
AOBP: right   arm large  cuff     1st reading:155/63  58  2nd reading:156/60  58  3rd reading: 152/60  59    Average: 154/61  58

## 2021-01-27 DIAGNOSIS — Z79899 Other long term (current) drug therapy: Principal | ICD-10-CM

## 2021-01-27 DIAGNOSIS — Z940 Kidney transplant status: Principal | ICD-10-CM

## 2021-01-27 LAB — CMV DNA, QUANTITATIVE, PCR
CMV QUANT: <50 [IU]/mL — ABNORMAL HIGH (ref <0)
CMV VIRAL LD: DETECTED — AB

## 2021-01-27 MED ORDER — ENVARSUS XR 1 MG TABLET,EXTENDED RELEASE
ORAL_TABLET | Freq: Every day | ORAL | 11 refills | 90.00000 days
Start: 2021-01-27 — End: ?

## 2021-01-27 MED ORDER — ENVARSUS XR 4 MG TABLET,EXTENDED RELEASE
ORAL_TABLET | Freq: Every day | ORAL | 11 refills | 30 days
Start: 2021-01-27 — End: ?

## 2021-01-27 NOTE — Unmapped (Signed)
Frank Bock, MD  Drue Flirt, RN; Lorel Monaco Chargualaf, CPP  Decrease to 4 mg from 5 mg please.     This covering coordinator called patient to reduce his medication  To 4 mg as noted above  Patient verbalizes understanding  Updated Rx  And asked for repeat labs in 7-10 days

## 2021-02-01 LAB — FSAB CLASS 2 ANTIBODY SPECIFICITY: HLA CL2 AB RESULT: NEGATIVE

## 2021-02-01 LAB — FSAB CLASS 1 ANTIBODY SPECIFICITY: HLA CLASS 1 ANTIBODY RESULT: NEGATIVE

## 2021-02-01 LAB — HLA DS POST TRANSPLANT
ANTI-DONOR DRW #1 MFI: 229 MFI
ANTI-DONOR HLA-A #1 MFI: 184 MFI
ANTI-DONOR HLA-A #2 MFI: 106 MFI
ANTI-DONOR HLA-B #1 MFI: 49 MFI
ANTI-DONOR HLA-B #2 MFI: 109 MFI
ANTI-DONOR HLA-C #1 MFI: 287 MFI
ANTI-DONOR HLA-C #2 MFI: 310 MFI
ANTI-DONOR HLA-DQB #1 MFI: 626 MFI
ANTI-DONOR HLA-DQB #2 MFI: 718 MFI
ANTI-DONOR HLA-DR #1 MFI: 166 MFI
ANTI-DONOR HLA-DR #2 MFI: 18 MFI

## 2021-02-02 DIAGNOSIS — Z79899 Other long term (current) drug therapy: Principal | ICD-10-CM

## 2021-02-02 DIAGNOSIS — Z940 Kidney transplant status: Principal | ICD-10-CM

## 2021-02-02 MED ORDER — ENVARSUS XR 4 MG TABLET,EXTENDED RELEASE
ORAL_TABLET | Freq: Every day | ORAL | 11 refills | 30 days | Status: CP
Start: 2021-02-02 — End: ?

## 2021-02-02 NOTE — Unmapped (Addendum)
[facility] Pharm[facility] Refill Coordination Note    Specialty Medication(s) to be Shipped:   Transplant: Envarsus 4mg  and mycophenolate mofetil 180mg    **Denied Valganciclovir**    Other medication(s) to be shipped: jardiance, magnesium, atorvastatin and amlodipine     Frank Leach, DOB: 23-Jul-1951  Phone: [phone] (home)       All above HIPAA information was verified with patient.     Was a Nurse, learning disability used for this call? No    Completed refill call assessment today to schedule patient's medication shipment from the [facility] [phone]).  All relevant notes have been reviewed.     Specialty medication(s) and dose(s) confirmed: Patient reports changes to the regimen as follows: Envarsus is 4mg  daily **sent rf request**   Changes to medications: Frank Leach reports no changes at this time.  Changes to insurance: No  New side effects reported not previously addressed with a pharmacist or physician: None reported  Questions for the pharmacist: No    Confirmed patient received a Conservation officer, historic buildings and a Surveyor, mining with first shipment. The patient will receive a drug information handout for each medication shipped and additional FDA Medication Guides as required.       DISEASE/MEDICATION-SPECIFIC INFORMATION        N/A    SPECIALTY MEDICATION ADHERENCE     Medication Adherence    Patient reported X missed doses in the last month: 0  Specialty Medication: Envarsus 4mg   Patient is on additional specialty medications: Yes  Additional Specialty Medications: Mycophenolate 180mg   Patient Reported Additional Medication X Missed Doses in the Last Month: 0  Patient is on more than two specialty medications: Yes  Specialty Medication: Valganciclovir 450mg   Patient Reported Additional Medication X Missed Doses in the Last Month: 0        Were doses missed due to medication being on hold? No    Envarsus 4 mg: 7 days of medicine on hand   Mycophenolate 180 mg: 7 days of medicine on hand     REFERRAL TO PHARMACIST     Referral to the pharmacist: Not needed      [facility]     Shipping address confirmed in Epic.     Delivery Scheduled: Yes, Expected medication delivery date: 02/09/2021.     Medication will be delivered via UPS to the prescription address in Epic [place].    Frank Leach [facility] Specialty Technician

## 2021-02-02 NOTE — Unmapped (Signed)
[facility] Pharm[facility] Clinical Assessment & Refill Coordination Note    Frank Leach, Frank Leach: 15-Sep-1950  Phone: [phone] (home)     All above HIPAA information was verified with patient.     Was a Nurse, learning disability used for this call? No    Specialty Medication(s):   Transplant: Envarsus 4mg ,  mycophenolic acid 180mg  and valgancyclovir 450mg      Current Outpatient Medications   Medication Sig Dispense Refill   ??? acetaminophen (TYLENOL) 500 MG tablet Take 1-2 tablets (500-1,000 mg total) by mouth every six (6) hours as needed for pain or fever (> 38C). 100 tablet 0   ??? allopurinoL (ZYLOPRIM) 100 MG tablet Take 1 tablet (100 mg total) by mouth every evening. 90 tablet 3   ??? amLODIPine (NORVASC) 10 MG tablet Take 1 tablet (10 mg total) by mouth daily. 30 tablet 11   ??? atorvastatin (LIPITOR) 40 MG tablet Take 1 tablet (40 mg total) by mouth daily. 90 tablet 3   ??? carvediloL (COREG) 6.25 MG tablet Take 2 tablets (12.5 mg total) by mouth Two (2) times a day. 360 tablet 3   ??? cholecalciferol, vitamin D3-125 mcg, 5,000 unit,, 125 mcg (5,000 unit) tablet Take 1 tablet (125 mcg total) by mouth daily.     ??? empagliflozin (JARDIANCE) 10 mg tablet Take 1 tablet once daily 90 tablet 1   ??? empagliflozin (JARDIANCE) 25 mg tablet Take 1 tablet (25 mg total) by mouth daily. 90 tablet 3   ??? ENVARSUS XR 1 mg Tb24 extended release tablet Take 1 tablet (1 mg total) by mouth in the morning. HOLD- Take 4 mg daily. 90 tablet 11   ??? ENVARSUS XR 4 mg Tb24 extended release tablet Take 1 tablet (4 mg total) by mouth in the morning. 30 tablet 11   ??? magnesium oxide-Mg AA chelate (MAGNESIUM, AMINO ACID CHELATE,) 133 mg Tab Take 1 tablet by mouth two (2) times a day. 100 tablet 11   ??? metFORMIN (GLUCOPHAGE-XR) 500 MG 24 hr tablet Take 2 tablets (1,000 mg total) by mouth daily. 180 tablet 3   ??? mycophenolate (MYFORTIC) 180 MG EC tablet Take 2 tablets (360 mg total) by mouth Two (2) times a day. 60 tablet 11   ??? olmesartan (BENICAR) 20 MG tablet Take 1 tablet (20 mg total) by mouth daily. 90 tablet 3   ??? omeprazole (PRILOSEC) 40 MG capsule Take 1 capsule (40 mg total) by mouth every other day. 45 capsule 3   ??? valGANciclovir (VALCYTE) 450 mg tablet Take 1 tablet (450 mg total) by mouth daily. 180 tablet 3     No current facility-administered medications for this visit.        Changes to medications: Frank Leach reports no changes at this time.    No Known Allergies    Changes to allergies: No    SPECIALTY MEDICATION ADHERENCE     Envarsus Xr 4 mg: 14 days of medicine on hand   Mycophenolate 180 mg: 14 days of medicine on hand   Valganciclovir 450 mg: 14 days of medicine on hand       Medication Adherence    Patient reported X missed doses in the last month: 0  Specialty Medication: Envarsus Xr 4mg   Patient is on additional specialty medications: Yes  Additional Specialty Medications: Mycophenolate 180mg   Patient Reported Additional Medication X Missed Doses in the Last Month: 0  Patient is on more than two specialty medications: Yes  Specialty Medication: Valganciclovir 450mg   Patient  Reported Additional Medication X Missed Doses in the Last Month: 0          Specialty medication(s) dose(s) confirmed: Regimen is correct and unchanged.     Are there any concerns with adherence? No    Adherence counseling provided? Not needed    CLINICAL MANAGEMENT AND INTERVENTION      Clinical Benefit Assessment:    Do you feel the medicine is effective or helping your condition? Yes    Clinical Benefit counseling provided? Not needed    Adverse Effects Assessment:    Are you experiencing any side effects? No    Are you experiencing difficulty administering your medicine? No    Quality of Life Assessment:    How many days over the past month did your kidney transplant  keep you from your normal activities? For example, brushing your teeth or getting up in the morning. 0    Have you discussed this with your provider? Not needed    Acute Infection Status:    Acute infections noted within Epic:  No active infections  Patient reported infection: None    Therapy Appropriateness:    Is therapy appropriate? Yes, therapy is appropriate and should be continued    DISEASE/MEDICATION-SPECIFIC INFORMATION      N/A    PATIENT SPECIFIC NEEDS     - Does the patient have any physical, cognitive, or cultural barriers? No    - Is the patient high risk? Yes, patient is taking a REMS drug. Medication is dispensed in compliance with REMS program    - Does the patient require a Care Management Plan? No     - Does the patient require physician intervention or other additional services (i.e. nutrition, smoking cessation, social work)? No      SHIPPING     Specialty Medication(s) to be Shipped:   Transplant: Patient declined mycophenolate, envarsus and valganciclovir today.     Other medication(s) to be shipped: No additional medications requested for fill at this time     Changes to insurance: No    Delivery Scheduled: Patient declined refill at this time due to has 14 days of medication on hand..     Medication will be delivered via UPS to the confirmed prescription address in [facility].    The patient will receive a drug information handout for each medication shipped and additional FDA Medication Guides as required.  Verified that patient has previously received a Conservation officer, historic buildings and a Surveyor, mining.    The patient or caregiver noted above participated in the development of this care plan and knows that they can request review of or adjustments to the care plan at any time.      All of the patient's questions and concerns have been addressed.    Frank Leach   [facility] Specialty Pharmacist

## 2021-02-04 ENCOUNTER — Ambulatory Visit: Admit: 2021-02-04 | Discharge: 2021-02-05 | Payer: MEDICARE

## 2021-02-04 LAB — CBC W/ AUTO DIFF
BASOPHILS ABSOLUTE COUNT: 0.1 10*9/L (ref 0.0–0.1)
BASOPHILS RELATIVE PERCENT: 1.6 %
EOSINOPHILS ABSOLUTE COUNT: 0.3 10*9/L (ref 0.0–0.5)
EOSINOPHILS RELATIVE PERCENT: 5.1 %
HEMATOCRIT: 39.5 % (ref 39.0–48.0)
HEMOGLOBIN: 12.9 g/dL (ref 12.9–16.5)
LYMPHOCYTES ABSOLUTE COUNT: 0.7 10*9/L — ABNORMAL LOW (ref 1.1–3.6)
LYMPHOCYTES RELATIVE PERCENT: 14.5 %
MEAN CORPUSCULAR HEMOGLOBIN CONC: 32.7 g/dL (ref 32.0–36.0)
MEAN CORPUSCULAR HEMOGLOBIN: 32.3 pg (ref 25.9–32.4)
MEAN CORPUSCULAR VOLUME: 98.5 fL — ABNORMAL HIGH (ref 77.6–95.7)
MEAN PLATELET VOLUME: 8 fL (ref 6.8–10.7)
MONOCYTES ABSOLUTE COUNT: 0.7 10*9/L (ref 0.3–0.8)
MONOCYTES RELATIVE PERCENT: 13.9 %
NEUTROPHILS ABSOLUTE COUNT: 3.4 10*9/L (ref 1.8–7.8)
NEUTROPHILS RELATIVE PERCENT: 64.9 %
NUCLEATED RED BLOOD CELLS: 0 /100{WBCs} (ref <=4)
PLATELET COUNT: 202 10*9/L (ref 150–450)
RED BLOOD CELL COUNT: 4.01 10*12/L — ABNORMAL LOW (ref 4.26–5.60)
RED CELL DISTRIBUTION WIDTH: 14.1 % (ref 12.2–15.2)
WBC ADJUSTED: 5.2 10*9/L (ref 3.6–11.2)

## 2021-02-04 LAB — RENAL FUNCTION PANEL
ALBUMIN: 3.9 g/dL (ref 3.4–5.0)
ANION GAP: 8 mmol/L (ref 5–14)
BLOOD UREA NITROGEN: 17 mg/dL (ref 9–23)
BUN / CREAT RATIO: 12
CALCIUM: 10 mg/dL (ref 8.7–10.4)
CHLORIDE: 111 mmol/L — ABNORMAL HIGH (ref 98–107)
CO2: 22.9 mmol/L (ref 20.0–31.0)
CREATININE: 1.44 mg/dL — ABNORMAL HIGH
EGFR CKD-EPI (2021) MALE: 53 mL/min/{1.73_m2} — ABNORMAL LOW (ref >=60)
GLUCOSE RANDOM: 151 mg/dL (ref 70–179)
PHOSPHORUS: 2.9 mg/dL (ref 2.4–5.1)
POTASSIUM: 4.8 mmol/L (ref 3.4–4.8)
SODIUM: 142 mmol/L (ref 135–145)

## 2021-02-04 LAB — SLIDE REVIEW

## 2021-02-04 LAB — MAGNESIUM: MAGNESIUM: 1.6 mg/dL (ref 1.6–2.6)

## 2021-02-04 LAB — TACROLIMUS LEVEL, TROUGH: TACROLIMUS, TROUGH: 5.3 ng/mL (ref 5.0–15.0)

## 2021-02-05 LAB — CMV DNA, QUANTITATIVE, PCR
CMV QUANT LOG10: 2.3 {Log_IU}/mL — ABNORMAL HIGH (ref <0.00)
CMV QUANT: 198 [IU]/mL — ABNORMAL HIGH (ref <0)
CMV VIRAL LD: DETECTED — AB

## 2021-02-09 MED FILL — ATORVASTATIN 40 MG TABLET: ORAL | 90 days supply | Qty: 90 | Fill #2

## 2021-02-09 MED FILL — MYCOPHENOLATE SODIUM 180 MG TABLET,DELAYED RELEASE: ORAL | 30 days supply | Qty: 120 | Fill #4

## 2021-02-09 MED FILL — JARDIANCE 25 MG TABLET: ORAL | 30 days supply | Qty: 30 | Fill #2

## 2021-02-09 MED FILL — ENVARSUS XR 4 MG TABLET,EXTENDED RELEASE: ORAL | 30 days supply | Qty: 30 | Fill #0

## 2021-02-09 MED FILL — MG-PLUS-PROTEIN 133 MG TABLET: ORAL | 50 days supply | Qty: 100 | Fill #1

## 2021-02-09 MED FILL — AMLODIPINE 10 MG TABLET: ORAL | 30 days supply | Qty: 30 | Fill #6

## 2021-02-14 ENCOUNTER — Ambulatory Visit: Admit: 2021-02-14 | Discharge: 2021-02-15 | Payer: MEDICARE

## 2021-02-14 LAB — RENAL FUNCTION PANEL
ALBUMIN: 4.2 g/dL (ref 3.4–5.0)
ANION GAP: 4 mmol/L — ABNORMAL LOW (ref 5–14)
BLOOD UREA NITROGEN: 18 mg/dL (ref 9–23)
BUN / CREAT RATIO: 13
CALCIUM: 10.2 mg/dL (ref 8.7–10.4)
CHLORIDE: 113 mmol/L — ABNORMAL HIGH (ref 98–107)
CO2: 24.8 mmol/L (ref 20.0–31.0)
CREATININE: 1.44 mg/dL — ABNORMAL HIGH
EGFR CKD-EPI (2021) MALE: 53 mL/min/{1.73_m2} — ABNORMAL LOW (ref >=60)
GLUCOSE RANDOM: 125 mg/dL (ref 70–179)
PHOSPHORUS: 3.2 mg/dL (ref 2.4–5.1)
POTASSIUM: 4.8 mmol/L (ref 3.4–4.8)
SODIUM: 142 mmol/L (ref 135–145)

## 2021-02-14 LAB — CBC W/ AUTO DIFF
BASOPHILS ABSOLUTE COUNT: 0.1 10*9/L (ref 0.0–0.1)
BASOPHILS RELATIVE PERCENT: 1.1 %
EOSINOPHILS ABSOLUTE COUNT: 0.3 10*9/L (ref 0.0–0.5)
EOSINOPHILS RELATIVE PERCENT: 4 %
HEMATOCRIT: 40.3 % (ref 39.0–48.0)
HEMOGLOBIN: 13.1 g/dL (ref 12.9–16.5)
LYMPHOCYTES ABSOLUTE COUNT: 0.7 10*9/L — ABNORMAL LOW (ref 1.1–3.6)
LYMPHOCYTES RELATIVE PERCENT: 11.1 %
MEAN CORPUSCULAR HEMOGLOBIN CONC: 32.5 g/dL (ref 32.0–36.0)
MEAN CORPUSCULAR HEMOGLOBIN: 31.8 pg (ref 25.9–32.4)
MEAN CORPUSCULAR VOLUME: 97.7 fL — ABNORMAL HIGH (ref 77.6–95.7)
MEAN PLATELET VOLUME: 8 fL (ref 6.8–10.7)
MONOCYTES ABSOLUTE COUNT: 1.3 10*9/L — ABNORMAL HIGH (ref 0.3–0.8)
MONOCYTES RELATIVE PERCENT: 20.4 %
NEUTROPHILS ABSOLUTE COUNT: 4.1 10*9/L (ref 1.8–7.8)
NEUTROPHILS RELATIVE PERCENT: 63.4 %
NUCLEATED RED BLOOD CELLS: 0 /100{WBCs} (ref <=4)
PLATELET COUNT: 212 10*9/L (ref 150–450)
RED BLOOD CELL COUNT: 4.13 10*12/L — ABNORMAL LOW (ref 4.26–5.60)
RED CELL DISTRIBUTION WIDTH: 14.2 % (ref 12.2–15.2)
WBC ADJUSTED: 6.5 10*9/L (ref 3.6–11.2)

## 2021-02-14 LAB — MAGNESIUM: MAGNESIUM: 1.7 mg/dL (ref 1.6–2.6)

## 2021-02-14 LAB — SLIDE REVIEW

## 2021-02-14 LAB — TACROLIMUS LEVEL, TROUGH: TACROLIMUS, TROUGH: 4.8 ng/mL — ABNORMAL LOW (ref 5.0–15.0)

## 2021-02-15 LAB — CMV DNA, QUANTITATIVE, PCR
CMV QUANT LOG10: 2.21 {Log_IU}/mL — ABNORMAL HIGH (ref <0.00)
CMV QUANT: 162 [IU]/mL — ABNORMAL HIGH (ref <0)
CMV VIRAL LD: DETECTED — AB

## 2021-02-22 NOTE — Unmapped (Signed)
Transplant Pharmacist CMV Monitoring      CMV Risk: D+/R+    Antiviral Prophyaxis: N/A    Antiviral Treatment: Valcyte stopped 01/26/21    Current Dose: N/A    Estimated Creatinine Clearance: 45.9 mL/min (A) (based on SCr of 1.44 mg/dL (H)).     Lab Results   Component Value Date    CMV Quant 162 (H) 02/14/2021    CMV Quant 198 (H) 02/04/2021    CMV Quant <50 (H) 01/26/2021    CMV Quant <50 (H) 01/05/2021    CMV Quant <50 (H) 12/06/2020       Plan: Monitor CMV PCR biweekly    Olivia Mackie, PharmD, BCTXP, BCPS, CPP  Solid Organ Transplant Clinical Pharmacist Practitioner

## 2021-03-02 NOTE — Unmapped (Signed)
[facility] Specialty Pharmacy Refill Coordination Note    Specialty Medication(s) to be Shipped:   Transplant: Envarsus 4mg  and mycophenolate mofetil 180mg     Other medication(s) to be shipped: Yahoo, DOB: Jan 08, 1951  Phone: [phone] (home)       All above HIPAA information was verified with patient.     Was a Nurse, learning disability used for this call? No    Completed refill call assessment today to schedule patient's medication shipment from the [facility] [phone]).  All relevant notes have been reviewed.     Specialty medication(s) and dose(s) confirmed: Regimen is correct and unchanged.   Changes to medications: Chong reports no changes at this time.  Changes to insurance: No  New side effects reported not previously addressed with a pharmacist or physician: None reported  Questions for the pharmacist: No    Confirmed patient received a Conservation officer, historic buildings and a Surveyor, mining with first shipment. The patient will receive a drug information handout for each medication shipped and additional FDA Medication Guides as required.       DISEASE/MEDICATION-SPECIFIC INFORMATION        N/A    SPECIALTY MEDICATION ADHERENCE     Medication Adherence    Patient reported X missed doses in the last month: 0  Specialty Medication: Envarsus 4mg   Patient is on additional specialty medications: Yes  Additional Specialty Medications: Mycophenolate 180mg   Patient Reported Additional Medication X Missed Doses in the Last Month: 0  Patient is on more than two specialty medications: No  Any gaps in refill history greater than 2 weeks in the last 3 months: no  Demonstrates understanding of importance of adherence: yes  Informant: patient  Reliability of informant: reliable  Confirmed plan for next specialty medication refill: delivery by pharmacy  Refills needed for supportive medications: not needed              Were doses missed due to medication being on hold? No    ENVARSUS XR 4 mg: 9 days of medicine on hand   mycophenolate 180 MG: 9 days of medicine on hand       REFERRAL TO PHARMACIST     Referral to the pharmacist: Not needed      [facility]     Shipping address confirmed in Epic.     Delivery Scheduled: Yes, Expected medication delivery date: 03/09/21.     Medication will be delivered via UPS to the prescription address in Epic [facility].    Yolonda Kida   [facility] Specialty Technician

## 2021-03-07 ENCOUNTER — Ambulatory Visit: Admit: 2021-03-07 | Discharge: 2021-03-08 | Payer: MEDICARE

## 2021-03-07 LAB — CBC W/ AUTO DIFF
BASOPHILS ABSOLUTE COUNT: 0.1 10*9/L (ref 0.0–0.1)
BASOPHILS RELATIVE PERCENT: 1.2 %
EOSINOPHILS ABSOLUTE COUNT: 0.4 10*9/L (ref 0.0–0.5)
EOSINOPHILS RELATIVE PERCENT: 5.8 %
HEMATOCRIT: 39.9 % (ref 39.0–48.0)
HEMOGLOBIN: 13.2 g/dL (ref 12.9–16.5)
LYMPHOCYTES ABSOLUTE COUNT: 0.7 10*9/L — ABNORMAL LOW (ref 1.1–3.6)
LYMPHOCYTES RELATIVE PERCENT: 11.4 %
MEAN CORPUSCULAR HEMOGLOBIN CONC: 33 g/dL (ref 32.0–36.0)
MEAN CORPUSCULAR HEMOGLOBIN: 31.5 pg (ref 25.9–32.4)
MEAN CORPUSCULAR VOLUME: 95.5 fL (ref 77.6–95.7)
MEAN PLATELET VOLUME: 8.2 fL (ref 6.8–10.7)
MONOCYTES ABSOLUTE COUNT: 0.9 10*9/L — ABNORMAL HIGH (ref 0.3–0.8)
MONOCYTES RELATIVE PERCENT: 13.9 %
NEUTROPHILS ABSOLUTE COUNT: 4.3 10*9/L (ref 1.8–7.8)
NEUTROPHILS RELATIVE PERCENT: 67.7 %
NUCLEATED RED BLOOD CELLS: 0 /100{WBCs} (ref <=4)
PLATELET COUNT: 193 10*9/L (ref 150–450)
RED BLOOD CELL COUNT: 4.17 10*12/L — ABNORMAL LOW (ref 4.26–5.60)
RED CELL DISTRIBUTION WIDTH: 14 % (ref 12.2–15.2)
WBC ADJUSTED: 6.4 10*9/L (ref 3.6–11.2)

## 2021-03-07 LAB — RENAL FUNCTION PANEL
ALBUMIN: 4 g/dL (ref 3.4–5.0)
ANION GAP: 7 mmol/L (ref 5–14)
BLOOD UREA NITROGEN: 17 mg/dL (ref 9–23)
BUN / CREAT RATIO: 12
CALCIUM: 9.9 mg/dL (ref 8.7–10.4)
CHLORIDE: 111 mmol/L — ABNORMAL HIGH (ref 98–107)
CO2: 24.3 mmol/L (ref 20.0–31.0)
CREATININE: 1.44 mg/dL — ABNORMAL HIGH
EGFR CKD-EPI (2021) MALE: 53 mL/min/{1.73_m2} — ABNORMAL LOW (ref >=60)
GLUCOSE RANDOM: 125 mg/dL (ref 70–179)
PHOSPHORUS: 3.2 mg/dL (ref 2.4–5.1)
POTASSIUM: 4.4 mmol/L (ref 3.4–4.8)
SODIUM: 142 mmol/L (ref 135–145)

## 2021-03-07 LAB — SLIDE REVIEW

## 2021-03-07 LAB — MAGNESIUM: MAGNESIUM: 1.8 mg/dL (ref 1.6–2.6)

## 2021-03-07 LAB — TACROLIMUS LEVEL, TROUGH: TACROLIMUS, TROUGH: 5.2 ng/mL (ref 5.0–15.0)

## 2021-03-08 LAB — CMV DNA, QUANTITATIVE, PCR
CMV QUANT LOG10: 2.01 {Log_IU}/mL — ABNORMAL HIGH (ref <0.00)
CMV QUANT: 103 [IU]/mL — ABNORMAL HIGH (ref <0)
CMV VIRAL LD: DETECTED — AB

## 2021-03-08 MED FILL — ENVARSUS XR 4 MG TABLET,EXTENDED RELEASE: ORAL | 30 days supply | Qty: 30 | Fill #1

## 2021-03-08 MED FILL — MYCOPHENOLATE SODIUM 180 MG TABLET,DELAYED RELEASE: ORAL | 30 days supply | Qty: 120 | Fill #5

## 2021-03-08 MED FILL — JARDIANCE 25 MG TABLET: ORAL | 30 days supply | Qty: 30 | Fill #3

## 2021-03-24 MED FILL — MG-PLUS-PROTEIN 133 MG TABLET: ORAL | 50 days supply | Qty: 100 | Fill #2

## 2021-03-30 DIAGNOSIS — Z940 Kidney transplant status: Principal | ICD-10-CM

## 2021-03-30 DIAGNOSIS — Z79899 Other long term (current) drug therapy: Principal | ICD-10-CM

## 2021-03-30 MED ORDER — MYCOPHENOLATE SODIUM 180 MG TABLET,DELAYED RELEASE
ORAL_TABLET | Freq: Two times a day (BID) | ORAL | 11 refills | 15.00000 days
Start: 2021-03-30 — End: ?

## 2021-03-30 NOTE — Unmapped (Signed)
[facility] Specialty Pharmacy Refill Coordination Note    Specialty Medication(s) to be Shipped:   Transplant: Envarsus 4mg  and mycophenolate mofetil 180mg     Other medication(s) to be shipped: Jardiance, Allopurinol, Carvedilol     Frank Leach, DOB: [birth year]  Phone: [phone] (home)       All above HIPAA information was verified with patient.     Was a Nurse, learning disability used for this call? No    Completed refill call assessment today to schedule patient's medication shipment from the [facility] [phone]).  All relevant notes have been reviewed.     Specialty medication(s) and dose(s) confirmed: Regimen is correct and unchanged.   Changes to medications: [name] reports no changes at this time.  Changes to insurance: No  New side effects reported not previously addressed with a pharmacist or physician: None reported  Questions for the pharmacist: No    Confirmed patient received a Conservation officer, historic buildings and a Surveyor, mining with first shipment. The patient will receive a drug information handout for each medication shipped and additional FDA Medication Guides as required.       DISEASE/MEDICATION-SPECIFIC INFORMATION        N/A    SPECIALTY MEDICATION ADHERENCE     Medication Adherence    Patient reported X missed doses in the last month: 0  Specialty Medication: mycophenolate 180 MG  Patient is on additional specialty medications: Yes  Additional Specialty Medications: ENVARSUS XR 4 mg  Patient Reported Additional Medication X Missed Doses in the Last Month: 0  Patient is on more than two specialty medications: No  Informant: patient              Were doses missed due to medication being on hold? No    ENVARSUS XR 4 mg  : 14 days of medicine on hand   Mycophenolate 180 mg: 14 days of medicine on hand       REFERRAL TO PHARMACIST     Referral to the pharmacist: Not needed      SHIPPING     Shipping address confirmed in Epic.     Delivery Scheduled: Yes, Expected medication delivery date: 04/07/21.  However, Rx request for refills was sent to the provider as there are none remaining.     Medication will be delivered via UPS to the prescription address in Epic [place].    Frank Leach   [facility] Specialty Technician

## 2021-03-31 MED ORDER — MYCOPHENOLATE SODIUM 180 MG TABLET,DELAYED RELEASE
ORAL_TABLET | Freq: Two times a day (BID) | ORAL | 5 refills | 30 days | Status: CP
Start: 2021-03-31 — End: ?
  Filled 2021-04-06: qty 120, 30d supply, fill #0

## 2021-04-06 MED FILL — CARVEDILOL 6.25 MG TABLET: ORAL | 90 days supply | Qty: 360 | Fill #2

## 2021-04-06 MED FILL — ENVARSUS XR 4 MG TABLET,EXTENDED RELEASE: ORAL | 30 days supply | Qty: 30 | Fill #2

## 2021-04-06 MED FILL — JARDIANCE 25 MG TABLET: ORAL | 30 days supply | Qty: 30 | Fill #4

## 2021-04-06 MED FILL — ALLOPURINOL 100 MG TABLET: ORAL | 90 days supply | Qty: 90 | Fill #3

## 2021-04-07 ENCOUNTER — Ambulatory Visit: Admit: 2021-04-07 | Discharge: 2021-04-08 | Payer: MEDICARE

## 2021-04-07 LAB — RENAL FUNCTION PANEL
ALBUMIN: 4.2 g/dL (ref 3.4–5.0)
ANION GAP: 7 mmol/L (ref 5–14)
BLOOD UREA NITROGEN: 22 mg/dL (ref 9–23)
BUN / CREAT RATIO: 14
CALCIUM: 10.4 mg/dL (ref 8.7–10.4)
CHLORIDE: 113 mmol/L — ABNORMAL HIGH (ref 98–107)
CO2: 24.2 mmol/L (ref 20.0–31.0)
CREATININE: 1.57 mg/dL — ABNORMAL HIGH
EGFR CKD-EPI (2021) MALE: 48 mL/min/{1.73_m2} — ABNORMAL LOW (ref >=60)
GLUCOSE RANDOM: 123 mg/dL (ref 70–179)
PHOSPHORUS: 2.9 mg/dL (ref 2.4–5.1)
POTASSIUM: 4.9 mmol/L — ABNORMAL HIGH (ref 3.4–4.8)
SODIUM: 144 mmol/L (ref 135–145)

## 2021-04-07 LAB — CBC W/ AUTO DIFF
BASOPHILS ABSOLUTE COUNT: 0.1 10*9/L (ref 0.0–0.1)
BASOPHILS RELATIVE PERCENT: 1.5 %
EOSINOPHILS ABSOLUTE COUNT: 0.3 10*9/L (ref 0.0–0.5)
EOSINOPHILS RELATIVE PERCENT: 5.6 %
HEMATOCRIT: 40.2 % (ref 39.0–48.0)
HEMOGLOBIN: 12.9 g/dL (ref 12.9–16.5)
LYMPHOCYTES ABSOLUTE COUNT: 0.8 10*9/L — ABNORMAL LOW (ref 1.1–3.6)
LYMPHOCYTES RELATIVE PERCENT: 15 %
MEAN CORPUSCULAR HEMOGLOBIN CONC: 32 g/dL (ref 32.0–36.0)
MEAN CORPUSCULAR HEMOGLOBIN: 30.5 pg (ref 25.9–32.4)
MEAN CORPUSCULAR VOLUME: 95.2 fL (ref 77.6–95.7)
MEAN PLATELET VOLUME: 7.9 fL (ref 6.8–10.7)
MONOCYTES ABSOLUTE COUNT: 0.9 10*9/L — ABNORMAL HIGH (ref 0.3–0.8)
MONOCYTES RELATIVE PERCENT: 16.6 %
NEUTROPHILS ABSOLUTE COUNT: 3.4 10*9/L (ref 1.8–7.8)
NEUTROPHILS RELATIVE PERCENT: 61.3 %
NUCLEATED RED BLOOD CELLS: 0 /100{WBCs} (ref <=4)
PLATELET COUNT: 207 10*9/L (ref 150–450)
RED BLOOD CELL COUNT: 4.22 10*12/L — ABNORMAL LOW (ref 4.26–5.60)
RED CELL DISTRIBUTION WIDTH: 14.6 % (ref 12.2–15.2)
WBC ADJUSTED: 5.5 10*9/L (ref 3.6–11.2)

## 2021-04-07 LAB — MAGNESIUM: MAGNESIUM: 1.6 mg/dL (ref 1.6–2.6)

## 2021-04-07 LAB — TACROLIMUS LEVEL, TROUGH: TACROLIMUS, TROUGH: 6.7 ng/mL (ref 5.0–15.0)

## 2021-04-08 LAB — CMV DNA, QUANTITATIVE, PCR
CMV QUANT: <50 [IU]/mL — ABNORMAL HIGH (ref <0)
CMV VIRAL LD: DETECTED — AB

## 2021-04-14 MED FILL — METFORMIN ER 500 MG TABLET,EXTENDED RELEASE 24 HR: ORAL | 90 days supply | Qty: 180 | Fill #2

## 2021-04-14 MED FILL — OLMESARTAN 20 MG TABLET: ORAL | 90 days supply | Qty: 90 | Fill #2

## 2021-04-25 NOTE — Unmapped (Signed)
Transplant Pharmacist CMV Monitoring      CMV Risk: D+/R+    Antiviral Prophyaxis: N/A    Antiviral Treatment: Valcyte stopped 01/26/21    Current Dose: N/A    CrCl cannot be calculated (Unknown ideal weight.).     Lab Results   Component Value Date    CMV Quant <50 (H) 04/07/2021    CMV Quant 103 (H) 03/07/2021    CMV Quant 162 (H) 02/14/2021    CMV Quant 198 (H) 02/04/2021    CMV Quant <50 (H) 01/26/2021       Plan: Monitor CMV PCR biweekly    Olivia Mackie, PharmD, BCTXP, BCPS, CPP  Solid Organ Transplant Clinical Pharmacist Practitioner

## 2021-04-28 NOTE — Unmapped (Signed)
[facility] Refill Coordination Note    Specialty Medication(s) to be Shipped:   Transplant: Envarsus 4mg  and mycophenolate mofetil 180mg     Other medication(s) to be shipped: amlodipine and [name], DOB: Jun 23, 1951  Phone: [phone] (home)       All above HIPAA information was verified with patient.     Was a Nurse, learning disability used for this call? No    Completed refill call assessment today to schedule patient's medication shipment from the [facility] [phone]).  All relevant notes have been reviewed.     Specialty medication(s) and dose(s) confirmed: Regimen is correct and unchanged.   Changes to medications: Jonerik reports no changes at this time.  Changes to insurance: No  New side effects reported not previously addressed with a pharmacist or physician: None reported  Questions for the pharmacist: No    Confirmed patient received a Conservation officer, historic buildings and a Surveyor, mining with first shipment. The patient will receive a drug information handout for each medication shipped and additional FDA Medication Guides as required.       DISEASE/MEDICATION-SPECIFIC INFORMATION        N/A    SPECIALTY MEDICATION ADHERENCE     Medication Adherence    Patient reported X missed doses in the last month: 0  Specialty Medication: Envarsus 4mg   Patient is on additional specialty medications: Yes  Additional Specialty Medications: Mycophenolate 180mg   Patient Reported Additional Medication X Missed Doses in the Last Month: 0  Patient is on more than two specialty medications: No        Were doses missed due to medication being on hold? No    Envarsus 4 mg: 10 days of medicine on hand   Mycophenolate 180 mg: 10 days of medicine on hand     REFERRAL TO PHARMACIST     Referral to the pharmacist: Not needed      [facility]     Shipping address confirmed in Epic.     Delivery Scheduled: Yes, Expected medication delivery date: 05/02/2021.     Medication will be delivered via UPS to the prescription address in Epic WAM.    Lorelei Pont [facility] Specialty Technician

## 2021-04-29 MED FILL — MYCOPHENOLATE SODIUM 180 MG TABLET,DELAYED RELEASE: ORAL | 30 days supply | Qty: 120 | Fill #1

## 2021-04-29 MED FILL — JARDIANCE 25 MG TABLET: ORAL | 30 days supply | Qty: 30 | Fill #5

## 2021-04-29 MED FILL — ENVARSUS XR 4 MG TABLET,EXTENDED RELEASE: ORAL | 30 days supply | Qty: 30 | Fill #3

## 2021-04-29 MED FILL — AMLODIPINE 10 MG TABLET: ORAL | 30 days supply | Qty: 30 | Fill #7

## 2021-05-09 ENCOUNTER — Ambulatory Visit: Admit: 2021-05-09 | Discharge: 2021-05-10 | Payer: MEDICARE

## 2021-05-09 LAB — RENAL FUNCTION PANEL
ALBUMIN: 4.1 g/dL (ref 3.4–5.0)
ANION GAP: 9 mmol/L (ref 5–14)
BLOOD UREA NITROGEN: 24 mg/dL — ABNORMAL HIGH (ref 9–23)
BUN / CREAT RATIO: 15
CALCIUM: 10.3 mg/dL (ref 8.7–10.4)
CHLORIDE: 111 mmol/L — ABNORMAL HIGH (ref 98–107)
CO2: 24.5 mmol/L (ref 20.0–31.0)
CREATININE: 1.58 mg/dL — ABNORMAL HIGH
EGFR CKD-EPI (2021) MALE: 47 mL/min/{1.73_m2} — ABNORMAL LOW (ref >=60)
GLUCOSE RANDOM: 109 mg/dL (ref 70–179)
PHOSPHORUS: 3.1 mg/dL (ref 2.4–5.1)
POTASSIUM: 4.6 mmol/L (ref 3.4–4.8)
SODIUM: 144 mmol/L (ref 135–145)

## 2021-05-09 LAB — CBC W/ AUTO DIFF
BASOPHILS ABSOLUTE COUNT: 0.1 10*9/L (ref 0.0–0.1)
BASOPHILS RELATIVE PERCENT: 1.3 %
EOSINOPHILS ABSOLUTE COUNT: 0.3 10*9/L (ref 0.0–0.5)
EOSINOPHILS RELATIVE PERCENT: 4.8 %
HEMATOCRIT: 42.4 % (ref 39.0–48.0)
HEMOGLOBIN: 13.6 g/dL (ref 12.9–16.5)
LYMPHOCYTES ABSOLUTE COUNT: 0.9 10*9/L — ABNORMAL LOW (ref 1.1–3.6)
LYMPHOCYTES RELATIVE PERCENT: 15.5 %
MEAN CORPUSCULAR HEMOGLOBIN CONC: 32.1 g/dL (ref 32.0–36.0)
MEAN CORPUSCULAR HEMOGLOBIN: 30 pg (ref 25.9–32.4)
MEAN CORPUSCULAR VOLUME: 93.5 fL (ref 77.6–95.7)
MEAN PLATELET VOLUME: 8.5 fL (ref 6.8–10.7)
MONOCYTES ABSOLUTE COUNT: 0.9 10*9/L — ABNORMAL HIGH (ref 0.3–0.8)
MONOCYTES RELATIVE PERCENT: 15.9 %
NEUTROPHILS ABSOLUTE COUNT: 3.6 10*9/L (ref 1.8–7.8)
NEUTROPHILS RELATIVE PERCENT: 62.5 %
NUCLEATED RED BLOOD CELLS: 0 /100{WBCs} (ref <=4)
PLATELET COUNT: 211 10*9/L (ref 150–450)
RED BLOOD CELL COUNT: 4.53 10*12/L (ref 4.26–5.60)
RED CELL DISTRIBUTION WIDTH: 15.1 % (ref 12.2–15.2)
WBC ADJUSTED: 5.7 10*9/L (ref 3.6–11.2)

## 2021-05-09 LAB — TACROLIMUS LEVEL, TROUGH: TACROLIMUS, TROUGH: 4.7 ng/mL — ABNORMAL LOW (ref 5.0–15.0)

## 2021-05-09 LAB — MAGNESIUM: MAGNESIUM: 1.8 mg/dL (ref 1.6–2.6)

## 2021-05-10 LAB — CMV DNA, QUANTITATIVE, PCR
CMV QUANT LOG10: 1.87 {Log_IU}/mL — ABNORMAL HIGH (ref <0.00)
CMV QUANT: 74 [IU]/mL — ABNORMAL HIGH (ref <0)
CMV VIRAL LD: DETECTED — AB

## 2021-05-11 MED ORDER — MG-PLUS-PROTEIN 133 MG TABLET
ORAL_TABLET | Freq: Two times a day (BID) | ORAL | 11 refills | 50 days
Start: 2021-05-11 — End: ?

## 2021-05-12 MED ORDER — MG-PLUS-PROTEIN 133 MG TABLET
ORAL_TABLET | Freq: Two times a day (BID) | ORAL | 11 refills | 50 days | Status: CP
Start: 2021-05-12 — End: ?
  Filled 2021-05-12: qty 100, 50d supply, fill #0

## 2021-05-12 MED FILL — ATORVASTATIN 40 MG TABLET: ORAL | 90 days supply | Qty: 90 | Fill #3

## 2021-05-19 DIAGNOSIS — Z940 Kidney transplant status: Principal | ICD-10-CM

## 2021-05-19 DIAGNOSIS — Z79899 Other long term (current) drug therapy: Principal | ICD-10-CM

## 2021-05-24 NOTE — Unmapped (Signed)
Transplant Nephrology Clinic Visit    Subjective/Interval:   Since patient's last visit in the transplant clinic - patient has been doing well in terms of transplant, taking transplant medications regularly, no episodes of rejection and no side effects of medications.    Today patient presents feeling overall well. At home BP is normally 130s/140s with highest in the 150s. Blood sugars normally 110s-120s. Denies chest pain or tightness, nausea, vomiting, diarrhea, fevers, chills, swelling.    Assessment:  70 y.o. 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       Recommendations/Plan:     Allograft Function: DDKT KDPI: 42%06/22/2019  Renal function holding relatively stable with electrolytes and acid base balanced.     Baseline - 1.3-1.6  Last Cr:  1.45 Date:  05/27/21   [place]/ BK     neg Date:  01/26/21   DSA     neg Date:  01/26/21  CMV:     8 Date:  05/09/21, continue to monitor his CMV.  UPC:     0.545 Date:  05/27/21.       Immunosuppression Management [High Risk Medical Decision Making For Drug Therapy Requiring Intensive Monitoring For Toxicity]:   Takes Envarsus at 9:00 a.m and is on 4 mg and Myfortic 360 mg BID.  Tacro target: 4-6  Side effects: tremors.  Level is 4.4 - no changes.    Blood Pressure Management: Home BPs have been well-controlled. Continue current BP regimen.   On coreg to 6.25 mg PO BID and olmesartan 20 mg PO every day.  Stop Amlodipine and increase olmesartan to 40 mg daily.    Lipid Management: Last lipid panel on 07/05/2020. The 10-year ASCVD risk score Denman George DC Jr., et al., 2013) is: 36.3%  Currently taking atorvastatin     Electrolytes: WNL    RCC s/p bilateral nephrectomy:   CT RMP 08/29/2018 showed no suspicious enhancing lesions.   Repeat CT findings from 04/07/20 were overall stable.    D.M -not well controlled. A1C - 6.9  Continue Jardiance + metformin.    Health Maintenance: Colonoscopy: 04/30/2018 - 1 adenomatous polyp; repeat 5-10 years    Immunizations:   Flu Shot: Received fall 2022 per patient report.  Prevnar 13: 10/30/2013  Pneumovax: 04/24/2016, due 2022  No immunizations till 1 year post transplant.  COVID-19 (Moderna): 09/12/19, 10/14/19; booster received 04/07/20, 10/2020. Booster in clinic today.    History of Present Illness    69 y.o. male here for follow up after kidney transplantation.  Patient has ESRD secondary to HTN.  Patient has been on dialysis since 01/25/2012.  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 Patient was admitted for kidney transplant on 06/21/2019.      Transplant History:    Organ Received: Left DDKT, DBD, PHS, KDPI: 42%; 21h cold ischemia  Native Kidney Disease: HTN; cPRA: 0%  Date of Transplant: 06/22/2019  Post-Transplant Course: HD once for high potassium  Prior Transplants: none  Induction: [place]  Date of Ureteral Stent Removal: 07/29/2019  CMV/EBV Status: CMV D+/R+  EBV D+/R+  Rejection Episodes: none  Donor Specific Antibodies: none  Results of Renal Imaging (pre and post):   Pre-Txp 08/29/2018 CT RMP  Sequela of bilateral nephrectomy. Interval decrease in linear soft tissue within the right nephrectomy bed, potentially postsurgical or fat necrosis. Unchanged linear tissue within the left nephrectomy bed. No suspicious enhancing lesions are  visualized within the surgical beds    Post-Txp 06/25/2019 (txp kidney only)  The renal transplant was located in the left lower quadrant. Normal size and echogenicity.  No solid masses or calculi. Trace perinephric fluid adjacent to the lower pole of the kidney. Mild pelviectasis  - Perfusion: Using power Doppler, normal perfusion was seen throughout the renal parenchyma.  - Resistive indices in the renal transplant are stable compared with prior examination.  - Main renal artery/iliac artery: Patent. Again noted are 3 renal arteries. Resistive indices within the renal arteries are stable to minimally increased, now at or just above normal limits.  - Main renal vein/iliac vein: Patent    Past Medical History  1. HTN  2. DM II  3. RCC s/p Bilateral nephrectomy (L 04/2016, R 12/2016)  4. GERD  5. HLD  6. 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    Medications  Current Outpatient Medications   Medication Sig Dispense Refill   ??? acetaminophen (TYLENOL) 500 MG tablet Take 1-2 tablets (500-1,000 mg total) by mouth every six (6) hours as needed for pain or fever (> 38C). 100 tablet 0   ??? allopurinoL (ZYLOPRIM) 100 MG tablet Take 1 tablet (100 mg total) by mouth every evening. 90 tablet 3   ??? amLODIPine (NORVASC) 10 MG tablet Take 0.5 tablets (5 mg total) by mouth two (2) times a day. 30 tablet 11   ??? atorvastatin (LIPITOR) 40 MG tablet Take 1 tablet (40 mg total) by mouth daily. 90 tablet 3   ??? carvediloL (COREG) 6.25 MG tablet Take 2 tablets (12.5 mg total) by mouth Two (2) times a day. 360 tablet 3   ??? cholecalciferol, vitamin D3-125 mcg, 5,000 unit,, 125 mcg (5,000 unit) tablet Take 1 tablet (125 mcg total) by mouth daily.     ??? empagliflozin (JARDIANCE) 25 mg tablet Take 1 tablet (25 mg total) by mouth daily. 90 tablet 3   ??? ENVARSUS XR 1 mg Tb24 extended release tablet Take 1 tablet (1 mg total) by mouth in the morning. HOLD- Take 4 mg daily. 90 tablet 11   ??? ENVARSUS XR 4 mg Tb24 extended release tablet Take 1 tablet (4 mg) by mouth daily in addition to one 1 mg tablet for a total daily dose of  5 mg 30 tablet 11   ??? magnesium oxide-Mg AA chelate (MAGNESIUM, AMINO ACID CHELATE,) 133 mg Take 1 tablet by mouth two (2) times a day. 100 tablet 11   ??? metFORMIN (GLUCOPHAGE-XR) 500 MG 24 hr tablet Take 2 tablets (1,000 mg total) by mouth daily. 180 tablet 3   ??? mycophenolate (MYFORTIC) 180 MG EC tablet Take 2 tablets (360 mg total) by mouth Two (2) times a day. 120 tablet 5   ??? olmesartan (BENICAR) 20 MG tablet Take 1 tablet (20 mg total) by mouth daily. 90 tablet 3   ??? omeprazole (PRILOSEC) 40 MG capsule Take 1 capsule (40 mg total) by mouth every other day. 45 capsule 3     No current facility-administered medications for this visit.     Physical Exam:   BP 142/60 (BP Site: R Arm, BP Position: Sitting, BP Cuff Size: Large)  - Pulse 51  - Temp 36.3 ??C (97.4 ??F) (Temporal)  - Ht 170.2 cm (5' 7)  - Wt 74.1 kg (163 lb 6.4 oz)  - BMI 25.59 kg/m??  Nursing note and vitals reviewed.   Constitutional: Oriented to person, place, and time. Appears well-developed and well-nourished. No distress.   HENT: Wearing face mask.  Head: Normocephalic and atraumatic.   Eyes: Right eye exhibits no discharge. Left eye exhibits no discharge. No scleral icterus.   Neck: Normal range of motion. Neck supple.   Cardiovascular: Normal rate and regular rhythm. Exam reveals no gallop and no friction rub. No murmur heard.   Pulmonary/Chest: Effort normal and breath sounds normal. No respiratory distress.   Abdominal: Soft. Non tender  Musculoskeletal: Normal range of motion. No edema and no tenderness.   Neurological: Alert and oriented to person, place, and time.   Skin: Skin is warm and dry.   Psychiatric: Normal mood and affect.       Laboratory Data and Imaging reviewed in EPIC    Follow-Up: Return to clinic in 4 months.   Labs: once a month.    Scribe's Attestation: Leeroy Bock, MD obtained and performed the history, physical exam and medical decision making elements that were entered into the chart.  Signed by Lauralee Evener, Scribe, on May 27, 2021 at 9:10 AM.    Documentation assistance provided by the Scribe. I was present during the time the encounter was recorded. The information recorded by the Scribe was done at my direction and has been reviewed and validated by me.

## 2021-05-26 NOTE — Unmapped (Signed)
[facility] Refill Coordination Note    Specialty Medication(s) to be Shipped:   Transplant: Envarsus 4mg  and  mycophenolic acid 180mg     Other medication(s) to be shipped: amlodipine, [address] DOB: 1950/09/16  Phone: [phone] (home)       All above HIPAA information was verified with patient.     Was a Nurse, learning disability used for this call? No    Completed refill call assessment today to schedule patient's medication shipment from the [facility] [phone]).  All relevant notes have been reviewed.     Specialty medication(s) and dose(s) confirmed: Regimen is correct and unchanged.   Changes to medications: Chau reports no changes at this time.  Changes to insurance: No  New side effects reported not previously addressed with a pharmacist or physician: None reported  Questions for the pharmacist: No    Confirmed patient received a Conservation officer, historic buildings and a Surveyor, mining with first shipment. The patient will receive a drug information handout for each medication shipped and additional FDA Medication Guides as required.       DISEASE/MEDICATION-SPECIFIC INFORMATION        N/A    SPECIALTY MEDICATION ADHERENCE     Medication Adherence    Patient reported X missed doses in the last month: 0  Specialty Medication: mycophenolate (MYFORTIC) 180 MG EC tablet  Patient is on additional specialty medications: Yes  Additional Specialty Medications: ENVARSUS XR 4 mg Tb24 extended release tablet  Patient Reported Additional Medication X Missed Doses in the Last Month: 0              Were doses missed due to medication being on hold? No     Envarsus 4mg    10 days worth of medication on hand.  mycophenolic acid 180mg    10 days worth of medication on hand.    REFERRAL TO PHARMACIST     Referral to the pharmacist: Not needed      [facility]     Shipping address confirmed in Epic.     Delivery Scheduled: Yes, Expected medication delivery date: 06/02/21. Medication will be delivered via UPS to the prescription address in Epic WAM.    Frank Leach   [facility] Specialty Technician

## 2021-05-27 ENCOUNTER — Ambulatory Visit: Admit: 2021-05-27 | Discharge: 2021-05-28 | Payer: MEDICARE

## 2021-05-27 ENCOUNTER — Ambulatory Visit: Admit: 2021-05-27 | Discharge: 2021-05-28 | Payer: MEDICARE | Attending: Nephrology | Primary: Nephrology

## 2021-05-27 DIAGNOSIS — Z79899 Other long term (current) drug therapy: Principal | ICD-10-CM

## 2021-05-27 DIAGNOSIS — I10 Essential (primary) hypertension: Principal | ICD-10-CM

## 2021-05-27 DIAGNOSIS — Z940 Kidney transplant status: Principal | ICD-10-CM

## 2021-05-27 DIAGNOSIS — B259 Cytomegaloviral disease, unspecified: Principal | ICD-10-CM

## 2021-05-27 LAB — URINALYSIS
BILIRUBIN UA: NEGATIVE
BLOOD UA: NEGATIVE
GLUCOSE UA: 500 — AB
KETONES UA: NEGATIVE
LEUKOCYTE ESTERASE UA: NEGATIVE
NITRITE UA: NEGATIVE
PH UA: 6 (ref 5.0–9.0)
PROTEIN UA: 30 — AB
RBC UA: 1 /HPF (ref <3)
SPECIFIC GRAVITY UA: 1.015 (ref 1.005–1.030)
SQUAMOUS EPITHELIAL: 1 /HPF (ref 0–5)
UROBILINOGEN UA: 0.2
WBC UA: <1 /HPF (ref <2)

## 2021-05-27 LAB — HEPATIC FUNCTION PANEL
ALBUMIN: 4.2 g/dL (ref 3.4–5.0)
ALKALINE PHOSPHATASE: 70 U/L (ref 46–116)
ALT (SGPT): 11 U/L (ref 10–49)
AST (SGOT): 9 U/L (ref <=34)
BILIRUBIN DIRECT: 0.3 mg/dL (ref 0.00–0.30)
BILIRUBIN TOTAL: 0.6 mg/dL (ref 0.3–1.2)
PROTEIN TOTAL: 7.4 g/dL (ref 5.7–8.2)

## 2021-05-27 LAB — BASIC METABOLIC PANEL
ANION GAP: 9 mmol/L (ref 5–14)
BLOOD UREA NITROGEN: 20 mg/dL (ref 9–23)
BUN / CREAT RATIO: 14
CALCIUM: 10.5 mg/dL — ABNORMAL HIGH (ref 8.7–10.4)
CHLORIDE: 110 mmol/L — ABNORMAL HIGH (ref 98–107)
CO2: 23.7 mmol/L (ref 20.0–31.0)
CREATININE: 1.45 mg/dL — ABNORMAL HIGH
EGFR CKD-EPI (2021) MALE: 52 mL/min/{1.73_m2} — ABNORMAL LOW (ref >=60)
GLUCOSE RANDOM: 122 mg/dL — ABNORMAL HIGH (ref 70–99)
POTASSIUM: 4.3 mmol/L (ref 3.4–4.8)
SODIUM: 143 mmol/L (ref 135–145)

## 2021-05-27 LAB — LIPID PANEL
CHOLESTEROL/HDL RATIO SCREEN: 3 (ref 1.0–4.5)
CHOLESTEROL: 76 mg/dL (ref <=200)
HDL CHOLESTEROL: 25 mg/dL — ABNORMAL LOW (ref 40–60)
LDL CHOLESTEROL CALCULATED: 33 mg/dL — ABNORMAL LOW (ref 40–99)
NON-HDL CHOLESTEROL: 51 mg/dL — ABNORMAL LOW (ref 70–130)
TRIGLYCERIDES: 88 mg/dL (ref 0–150)
VLDL CHOLESTEROL CAL: 17.6 mg/dL (ref 12–42)

## 2021-05-27 LAB — CBC W/ AUTO DIFF
BASOPHILS ABSOLUTE COUNT: 0.1 10*9/L (ref 0.0–0.1)
BASOPHILS RELATIVE PERCENT: 1.3 %
EOSINOPHILS ABSOLUTE COUNT: 0.3 10*9/L (ref 0.0–0.5)
EOSINOPHILS RELATIVE PERCENT: 4.2 %
HEMATOCRIT: 41.7 % (ref 39.0–48.0)
HEMOGLOBIN: 13.3 g/dL (ref 12.9–16.5)
LYMPHOCYTES ABSOLUTE COUNT: 0.6 10*9/L — ABNORMAL LOW (ref 1.1–3.6)
LYMPHOCYTES RELATIVE PERCENT: 9.9 %
MEAN CORPUSCULAR HEMOGLOBIN CONC: 31.9 g/dL — ABNORMAL LOW (ref 32.0–36.0)
MEAN CORPUSCULAR HEMOGLOBIN: 29.9 pg (ref 25.9–32.4)
MEAN CORPUSCULAR VOLUME: 93.8 fL (ref 77.6–95.7)
MEAN PLATELET VOLUME: 8.7 fL (ref 6.8–10.7)
MONOCYTES ABSOLUTE COUNT: 0.9 10*9/L — ABNORMAL HIGH (ref 0.3–0.8)
MONOCYTES RELATIVE PERCENT: 15.4 %
NEUTROPHILS ABSOLUTE COUNT: 4.3 10*9/L (ref 1.8–7.8)
NEUTROPHILS RELATIVE PERCENT: 69.2 %
PLATELET COUNT: 220 10*9/L (ref 150–450)
RED BLOOD CELL COUNT: 4.45 10*12/L (ref 4.26–5.60)
RED CELL DISTRIBUTION WIDTH: 15.2 % (ref 12.2–15.2)
WBC ADJUSTED: 6.2 10*9/L (ref 3.6–11.2)

## 2021-05-27 LAB — PROTEIN / CREATININE RATIO, URINE
CREATININE, URINE: 88.5 mg/dL
PROTEIN URINE: 48.2 mg/dL
PROTEIN/CREAT RATIO, URINE: 0.545

## 2021-05-27 LAB — PARATHYROID HORMONE (PTH): PARATHYROID HORMONE INTACT: 130.5 pg/mL — ABNORMAL HIGH (ref 18.4–80.1)

## 2021-05-27 LAB — TACROLIMUS LEVEL, TROUGH: TACROLIMUS, TROUGH: 4.4 ng/mL — ABNORMAL LOW (ref 5.0–15.0)

## 2021-05-27 LAB — PHOSPHORUS: PHOSPHORUS: 3 mg/dL (ref 2.4–5.1)

## 2021-05-27 LAB — MAGNESIUM: MAGNESIUM: 1.7 mg/dL (ref 1.6–2.6)

## 2021-05-27 LAB — HEMOGLOBIN A1C
ESTIMATED AVERAGE GLUCOSE: 151 mg/dL
HEMOGLOBIN A1C: 6.9 % — ABNORMAL HIGH (ref 4.8–5.6)

## 2021-05-27 MED ORDER — AMLODIPINE 10 MG TABLET
ORAL_TABLET | Freq: Two times a day (BID) | ORAL | 11 refills | 30.00000 days | Status: CP
Start: 2021-05-27 — End: 2022-05-27

## 2021-05-27 NOTE — Unmapped (Signed)
Administered Moderna Bivalent as ordered onto right deltoid. Patient tolerated well without complications.

## 2021-05-27 NOTE — Unmapped (Signed)
Transplant Coordinator, Clinic Visit   Pt seen today by transplant nephrology for follow up, reviewed medications and symptoms.   Met with pt during clinic visit, pt came to clinic with daughter today. Pt reports that he is doing well. Pt got labs this morning, pt getting meds from pharmacy with no issues. Pt will get covid vaccine today. Pt got flu shot yesterday.   Per Dr. Nestor Lewandowsky:  Stop amlodipine  Increase Benicar to BID  New med sheet given to pt.     Assessment  BP: 143/64 today in clinic. Pt reports BP at home is usually 130's/70's  Lightheaded: denies  BG: 120-138 fasting at home  Headache: denies  Hand tremors: denies  Numbness/tingling: denies  Fevers: denies  Chills/sweats: denies  Shortness of breath: denies  Chest pain or pressure: denies  Palpitations: denies  Abdominal pain: denies  Heart burn: denies  Nausea/vomiting: denies  Diarrhea/constipation: denies  UTI symptoms: denies  Swelling: denies    Good appetite; reports adequate hydration.     Intake: 3-4 bottles    Any new medications? denies  Immunosuppressant last taken: 0900 on 05/26/21    Immunization status: Flu shot received yesterday per pt. Pt will get Covid booster today in clinic     Functional Score: 100   Normal no complaints; no evidence of  disease.     I spent a total of 10 minutes with Frank Leach reviewing medications and symptoms.

## 2021-05-27 NOTE — Unmapped (Signed)
`  AOBP: right  arm large  cuff   Average:142/60  Pulse:51  1st reading: 141/59 Pulse:51  2nd reading:143/64  Pulse:50  3rd reading: 143/56 Pulse:53

## 2021-05-28 LAB — CMV DNA, QUANTITATIVE, PCR
CMV QUANT LOG10: 1.88 {Log_IU}/mL — ABNORMAL HIGH (ref <0.00)
CMV QUANT: 75 [IU]/mL — ABNORMAL HIGH (ref <0)
CMV VIRAL LD: DETECTED — AB

## 2021-06-01 MED FILL — MYCOPHENOLATE SODIUM 180 MG TABLET,DELAYED RELEASE: ORAL | 30 days supply | Qty: 120 | Fill #2

## 2021-06-01 MED FILL — ENVARSUS XR 4 MG TABLET,EXTENDED RELEASE: ORAL | 30 days supply | Qty: 30 | Fill #4

## 2021-06-01 MED FILL — JARDIANCE 25 MG TABLET: ORAL | 30 days supply | Qty: 30 | Fill #6

## 2021-06-02 LAB — FSAB CLASS 1 ANTIBODY SPECIFICITY: HLA CLASS 1 ANTIBODY RESULT: POSITIVE

## 2021-06-02 LAB — HLA DS POST TRANSPLANT
ANTI-DONOR DRW #1 MFI: 149 MFI
ANTI-DONOR HLA-A #1 MFI: 84 MFI
ANTI-DONOR HLA-A #2 MFI: 68 MFI
ANTI-DONOR HLA-B #1 MFI: 19 MFI
ANTI-DONOR HLA-B #2 MFI: 79 MFI
ANTI-DONOR HLA-C #1 MFI: 219 MFI
ANTI-DONOR HLA-C #2 MFI: 236 MFI
ANTI-DONOR HLA-DP #2 MFI: 50 MFI
ANTI-DONOR HLA-DQB #1 MFI: 627 MFI
ANTI-DONOR HLA-DQB #2 MFI: 555 MFI
ANTI-DONOR HLA-DR #1 MFI: 446 MFI
ANTI-DONOR HLA-DR #2 MFI: 68 MFI

## 2021-06-02 LAB — VITAMIN D 25 HYDROXY: VITAMIN D, TOTAL (25OH): 46 ng/mL (ref 20.0–80.0)

## 2021-06-02 LAB — FSAB CLASS 2 ANTIBODY SPECIFICITY: HLA CL2 AB RESULT: NEGATIVE

## 2021-06-04 LAB — BK VIRUS QUANTITATIVE PCR, BLOOD: BK BLOOD RESULT: NOT DETECTED

## 2021-06-04 LAB — VITAMIN D 1,25 DIHYDROXY: VITAMIN D 1,25-DIHYDROXY: 25 pg/mL

## 2021-06-07 ENCOUNTER — Ambulatory Visit: Admit: 2021-06-07 | Discharge: 2021-06-08 | Payer: MEDICARE

## 2021-06-07 LAB — CBC W/ AUTO DIFF
BASOPHILS ABSOLUTE COUNT: 0.1 10*9/L (ref 0.0–0.1)
BASOPHILS RELATIVE PERCENT: 1 %
EOSINOPHILS ABSOLUTE COUNT: 0.3 10*9/L (ref 0.0–0.5)
EOSINOPHILS RELATIVE PERCENT: 5.4 %
HEMATOCRIT: 39.8 % (ref 39.0–48.0)
HEMOGLOBIN: 12.6 g/dL — ABNORMAL LOW (ref 12.9–16.5)
LYMPHOCYTES ABSOLUTE COUNT: 0.8 10*9/L — ABNORMAL LOW (ref 1.1–3.6)
LYMPHOCYTES RELATIVE PERCENT: 16.2 %
MEAN CORPUSCULAR HEMOGLOBIN CONC: 31.6 g/dL — ABNORMAL LOW (ref 32.0–36.0)
MEAN CORPUSCULAR HEMOGLOBIN: 29.7 pg (ref 25.9–32.4)
MEAN CORPUSCULAR VOLUME: 93.9 fL (ref 77.6–95.7)
MEAN PLATELET VOLUME: 7.7 fL (ref 6.8–10.7)
MONOCYTES ABSOLUTE COUNT: 0.9 10*9/L — ABNORMAL HIGH (ref 0.3–0.8)
MONOCYTES RELATIVE PERCENT: 16.5 %
NEUTROPHILS ABSOLUTE COUNT: 3.2 10*9/L (ref 1.8–7.8)
NEUTROPHILS RELATIVE PERCENT: 60.9 %
NUCLEATED RED BLOOD CELLS: 0 /100{WBCs} (ref <=4)
PLATELET COUNT: 200 10*9/L (ref 150–450)
RED BLOOD CELL COUNT: 4.24 10*12/L — ABNORMAL LOW (ref 4.26–5.60)
RED CELL DISTRIBUTION WIDTH: 16.1 % — ABNORMAL HIGH (ref 12.2–15.2)
WBC ADJUSTED: 5.2 10*9/L (ref 3.6–11.2)

## 2021-06-07 LAB — RENAL FUNCTION PANEL
ALBUMIN: 4.1 g/dL (ref 3.4–5.0)
ANION GAP: 7 mmol/L (ref 5–14)
BLOOD UREA NITROGEN: 25 mg/dL — ABNORMAL HIGH (ref 9–23)
BUN / CREAT RATIO: 16
CALCIUM: 9.8 mg/dL (ref 8.7–10.4)
CHLORIDE: 114 mmol/L — ABNORMAL HIGH (ref 98–107)
CO2: 23.2 mmol/L (ref 20.0–31.0)
CREATININE: 1.55 mg/dL — ABNORMAL HIGH
EGFR CKD-EPI (2021) MALE: 48 mL/min/{1.73_m2} — ABNORMAL LOW (ref >=60)
GLUCOSE RANDOM: 119 mg/dL (ref 70–179)
PHOSPHORUS: 2.7 mg/dL (ref 2.4–5.1)
POTASSIUM: 5.1 mmol/L — ABNORMAL HIGH (ref 3.4–4.8)
SODIUM: 144 mmol/L (ref 135–145)

## 2021-06-07 LAB — TACROLIMUS LEVEL, TROUGH: TACROLIMUS, TROUGH: 4.9 ng/mL — ABNORMAL LOW (ref 5.0–15.0)

## 2021-06-07 LAB — MAGNESIUM: MAGNESIUM: 1.7 mg/dL (ref 1.6–2.6)

## 2021-06-09 LAB — CMV DNA, QUANTITATIVE, PCR
CMV QUANT LOG10: 1.84 {Log_IU}/mL — ABNORMAL HIGH (ref <0.00)
CMV QUANT: 69 [IU]/mL — ABNORMAL HIGH (ref <0)
CMV VIRAL LD: DETECTED — AB

## 2021-06-22 MED FILL — OLMESARTAN 20 MG TABLET: ORAL | 90 days supply | Qty: 90 | Fill #3

## 2021-06-27 NOTE — Unmapped (Signed)
UNOS form updated.

## 2021-07-04 DIAGNOSIS — M109 Gout, unspecified: Principal | ICD-10-CM

## 2021-07-04 DIAGNOSIS — Z940 Kidney transplant status: Principal | ICD-10-CM

## 2021-07-04 MED ORDER — ALLOPURINOL 100 MG TABLET
ORAL_TABLET | Freq: Every evening | ORAL | 3 refills | 90 days | Status: CP
Start: 2021-07-04 — End: ?
  Filled 2021-07-06: qty 90, 90d supply, fill #0

## 2021-07-06 MED FILL — ENVARSUS XR 4 MG TABLET,EXTENDED RELEASE: ORAL | 30 days supply | Qty: 30 | Fill #5

## 2021-07-06 MED FILL — JARDIANCE 25 MG TABLET: ORAL | 30 days supply | Qty: 30 | Fill #7

## 2021-07-06 MED FILL — MYCOPHENOLATE SODIUM 180 MG TABLET,DELAYED RELEASE: ORAL | 30 days supply | Qty: 120 | Fill #3

## 2021-07-06 NOTE — Unmapped (Signed)
[facility] Clinical Assessment & Refill Coordination Note    Frank Leach, [place]: Dec 03, 1950  Phone: [phone] (home)     All above HIPAA information was verified with patient.     Was a Nurse, learning disability used for this call? No    Specialty Medication(s):   Transplant: Envarsus 4mg  and  mycophenolic acid 180mg      Current Outpatient Medications   Medication Sig Dispense Refill   ??? acetaminophen (TYLENOL) 500 MG tablet Take 1-2 tablets (500-1,000 mg total) by mouth every six (6) hours as needed for pain or fever (> 38C). 100 tablet 0   ??? allopurinoL (ZYLOPRIM) 100 MG tablet Take 1 tablet (100 mg total) by mouth every evening. 90 tablet 3   ??? amLODIPine (NORVASC) 10 MG tablet Take 0.5 tablets (5 mg total) by mouth two (2) times a day. 30 tablet 11   ??? atorvastatin (LIPITOR) 40 MG tablet Take 1 tablet (40 mg total) by mouth daily. 90 tablet 3   ??? carvediloL (COREG) 6.25 MG tablet Take 2 tablets (12.5 mg total) by mouth Two (2) times a day. 360 tablet 3   ??? cholecalciferol, vitamin D3-125 mcg, 5,000 unit,, 125 mcg (5,000 unit) tablet Take 1 tablet (125 mcg total) by mouth daily.     ??? empagliflozin (JARDIANCE) 25 mg tablet Take 1 tablet (25 mg total) by mouth daily. 90 tablet 3   ??? ENVARSUS XR 1 mg Tb24 extended release tablet Take 1 tablet (1 mg total) by mouth in the morning. HOLD- Take 4 mg daily. 90 tablet 11   ??? ENVARSUS XR 4 mg Tb24 extended release tablet Take 1 tablet (4 mg) by mouth daily in addition to one 1 mg tablet for a total daily dose of  5 mg 30 tablet 11   ??? magnesium oxide-Mg AA chelate (MAGNESIUM, AMINO ACID CHELATE,) 133 mg Take 1 tablet by mouth two (2) times a day. 100 tablet 11   ??? metFORMIN (GLUCOPHAGE-XR) 500 MG 24 hr tablet Take 2 tablets (1,000 mg total) by mouth daily. 180 tablet 3   ??? mycophenolate (MYFORTIC) 180 MG EC tablet Take 2 tablets (360 mg total) by mouth Two (2) times a day. 120 tablet 5   ??? olmesartan (BENICAR) 20 MG tablet Take 1 tablet (20 mg total) by mouth daily. 90 tablet 3   ??? omeprazole (PRILOSEC) 40 MG capsule Take 1 capsule (40 mg total) by mouth every other day. 45 capsule 3     No current facility-administered medications for this visit.        Changes to medications: Frank Leach reports no changes at this time.    No Known Allergies    Changes to allergies: No    SPECIALTY MEDICATION ADHERENCE     Envarsus Xr 4 mg: 7 days of medicine on hand   Mycophenolate 180 mg: 7 days of medicine on hand       Medication Adherence    Patient reported X missed doses in the last month: 0  Specialty Medication: ENVARSUS XR 4 mg  Patient is on additional specialty medications: Yes  Additional Specialty Medications: mycophenolate 180 MG   Patient Reported Additional Medication X Missed Doses in the Last Month: 0  Patient is on more than two specialty medications: No          Specialty medication(s) dose(s) confirmed: Regimen is correct and unchanged.     Are there any concerns with adherence? No    Adherence counseling provided? Not needed  CLINICAL MANAGEMENT AND INTERVENTION      Clinical Benefit Assessment:    Do you feel the medicine is effective or helping your condition? Yes    Clinical Benefit counseling provided? Not needed    Adverse Effects Assessment:    Are you experiencing any side effects? No    Are you experiencing difficulty administering your medicine? No    Quality of Life Assessment:         How many days over the past month did your Kidney transplant  keep you from your normal activities? For example, brushing your teeth or getting up in the morning. 0    Have you discussed this with your provider? Not needed    Acute Infection Status:    Acute infections noted within Epic:  No active infections  Patient reported infection: None    Therapy Appropriateness:    Is therapy appropriate and patient progressing towards therapeutic goals? Yes, therapy is appropriate and should be continued    DISEASE/MEDICATION-SPECIFIC INFORMATION N/A    PATIENT SPECIFIC NEEDS     - Does the patient have any physical, cognitive, or cultural barriers? No    - Is the patient high risk? Yes, patient is taking a REMS drug. Medication is dispensed in compliance with REMS program    - Does the patient require a Care Management Plan? No     - Does the patient require physician intervention or other additional services (i.e. nutrition, smoking cessation, social work)? No      SHIPPING     Specialty Medication(s) to be Shipped:   Transplant: Envarsus 4mg  and  mycophenolic acid 180mg     Other medication(s) to be shipped: Jardiance, Allopurinol     Changes to insurance: No    Delivery Scheduled: Yes, Expected medication delivery date: 07/07/21.     Medication will be delivered via Next Day Courier to the confirmed prescription address in [facility].    The patient will receive a drug information handout for each medication shipped and additional FDA Medication Guides as required.  Verified that patient has previously received a Conservation officer, historic buildings and a Surveyor, mining.    The patient or caregiver noted above participated in the development of this care plan and knows that they can request review of or adjustments to the care plan at any time.      All of the patient's questions and concerns have been addressed.    Frank Leach   [facility] Specialty Pharmacist

## 2021-07-11 ENCOUNTER — Ambulatory Visit: Admit: 2021-07-11 | Discharge: 2021-07-12 | Payer: MEDICARE

## 2021-07-11 DIAGNOSIS — Z940 Kidney transplant status: Principal | ICD-10-CM

## 2021-07-11 LAB — CBC W/ AUTO DIFF
BASOPHILS ABSOLUTE COUNT: 0.1 10*9/L (ref 0.0–0.1)
BASOPHILS RELATIVE PERCENT: 1.2 %
EOSINOPHILS ABSOLUTE COUNT: 0.3 10*9/L (ref 0.0–0.5)
EOSINOPHILS RELATIVE PERCENT: 5.6 %
HEMATOCRIT: 37.8 % — ABNORMAL LOW (ref 39.0–48.0)
HEMOGLOBIN: 12.2 g/dL — ABNORMAL LOW (ref 12.9–16.5)
LYMPHOCYTES ABSOLUTE COUNT: 0.7 10*9/L — ABNORMAL LOW (ref 1.1–3.6)
LYMPHOCYTES RELATIVE PERCENT: 13.2 %
MEAN CORPUSCULAR HEMOGLOBIN CONC: 32.2 g/dL (ref 32.0–36.0)
MEAN CORPUSCULAR HEMOGLOBIN: 29.7 pg (ref 25.9–32.4)
MEAN CORPUSCULAR VOLUME: 92.1 fL (ref 77.6–95.7)
MEAN PLATELET VOLUME: 7.9 fL (ref 6.8–10.7)
MONOCYTES ABSOLUTE COUNT: 1 10*9/L — ABNORMAL HIGH (ref 0.3–0.8)
MONOCYTES RELATIVE PERCENT: 18.4 %
NEUTROPHILS ABSOLUTE COUNT: 3.5 10*9/L (ref 1.8–7.8)
NEUTROPHILS RELATIVE PERCENT: 61.6 %
NUCLEATED RED BLOOD CELLS: 0 /100{WBCs} (ref <=4)
PLATELET COUNT: 215 10*9/L (ref 150–450)
RED BLOOD CELL COUNT: 4.1 10*12/L — ABNORMAL LOW (ref 4.26–5.60)
RED CELL DISTRIBUTION WIDTH: 15.4 % — ABNORMAL HIGH (ref 12.2–15.2)
WBC ADJUSTED: 5.6 10*9/L (ref 3.6–11.2)

## 2021-07-11 LAB — BASIC METABOLIC PANEL
ANION GAP: 8 mmol/L (ref 5–14)
BLOOD UREA NITROGEN: 21 mg/dL (ref 9–23)
BUN / CREAT RATIO: 14
CALCIUM: 10.4 mg/dL (ref 8.7–10.4)
CHLORIDE: 111 mmol/L — ABNORMAL HIGH (ref 98–107)
CO2: 24.4 mmol/L (ref 20.0–31.0)
CREATININE: 1.52 mg/dL — ABNORMAL HIGH
EGFR CKD-EPI (2021) MALE: 50 mL/min/{1.73_m2} — ABNORMAL LOW (ref >=60)
GLUCOSE RANDOM: 97 mg/dL (ref 70–179)
POTASSIUM: 4.7 mmol/L (ref 3.4–4.8)
SODIUM: 143 mmol/L (ref 135–145)

## 2021-07-11 LAB — PHOSPHORUS: PHOSPHORUS: 2.8 mg/dL (ref 2.4–5.1)

## 2021-07-11 LAB — TACROLIMUS LEVEL, TROUGH: TACROLIMUS, TROUGH: 4 ng/mL — ABNORMAL LOW (ref 5.0–15.0)

## 2021-07-11 LAB — MAGNESIUM: MAGNESIUM: 1.8 mg/dL (ref 1.6–2.6)

## 2021-07-12 LAB — CMV DNA, QUANTITATIVE, PCR
CMV QUANT: <50 [IU]/mL — ABNORMAL HIGH (ref <0)
CMV VIRAL LD: DETECTED — AB

## 2021-07-14 NOTE — Unmapped (Signed)
Transplant Pharmacist CMV Monitoring      CMV Risk: D+/R+    Antiviral Prophyaxis: N/A    Antiviral Treatment: Valcyte stopped 01/26/21    Current Dose: N/A    CrCl cannot be calculated (Unknown ideal weight.).     Lab Results   Component Value Date    CMV Quant <50 (H) 07/11/2021    CMV Quant 69 (H) 06/07/2021    CMV Quant 75 (H) 05/27/2021    CMV Quant 74 (H) 05/09/2021    CMV Quant <50 (H) 04/07/2021       Plan: Monitor CMV PCR monthly. If next VL < 50, can stop checking.    Olivia Mackie, PharmD, BCTXP, BCPS, CPP  Solid Organ Transplant Clinical Pharmacist Practitioner

## 2021-07-18 MED FILL — MG-PLUS-PROTEIN 133 MG TABLET: ORAL | 50 days supply | Qty: 100 | Fill #1

## 2021-07-18 MED FILL — METFORMIN ER 500 MG TABLET,EXTENDED RELEASE 24 HR: ORAL | 90 days supply | Qty: 180 | Fill #3

## 2021-07-29 MED ORDER — ATORVASTATIN 40 MG TABLET
ORAL_TABLET | Freq: Every day | ORAL | 3 refills | 90 days | Status: CP
Start: 2021-07-29 — End: 2022-07-29
  Filled 2021-08-02: qty 90, 90d supply, fill #0

## 2021-08-02 MED FILL — CARVEDILOL 6.25 MG TABLET: ORAL | 90 days supply | Qty: 360 | Fill #3

## 2021-08-02 MED FILL — MYCOPHENOLATE SODIUM 180 MG TABLET,DELAYED RELEASE: ORAL | 30 days supply | Qty: 120 | Fill #4

## 2021-08-02 MED FILL — ENVARSUS XR 4 MG TABLET,EXTENDED RELEASE: ORAL | 30 days supply | Qty: 30 | Fill #6

## 2021-08-02 MED FILL — JARDIANCE 25 MG TABLET: ORAL | 30 days supply | Qty: 30 | Fill #8

## 2021-08-04 DIAGNOSIS — Z79899 Other long term (current) drug therapy: Principal | ICD-10-CM

## 2021-08-04 DIAGNOSIS — Z940 Kidney transplant status: Principal | ICD-10-CM

## 2021-08-04 MED ORDER — OLMESARTAN 20 MG TABLET
ORAL_TABLET | Freq: Every day | ORAL | 3 refills | 90 days | Status: CP
Start: 2021-08-04 — End: 2022-08-04
  Filled 2021-08-10: qty 14, 14d supply, fill #0

## 2021-08-09 ENCOUNTER — Ambulatory Visit: Admit: 2021-08-09 | Discharge: 2021-08-10 | Payer: MEDICARE

## 2021-08-09 LAB — CBC W/ AUTO DIFF
BASOPHILS ABSOLUTE COUNT: 0.1 10*9/L (ref 0.0–0.1)
BASOPHILS RELATIVE PERCENT: 1.8 %
EOSINOPHILS ABSOLUTE COUNT: 0.2 10*9/L (ref 0.0–0.5)
EOSINOPHILS RELATIVE PERCENT: 5.6 %
HEMATOCRIT: 38.8 % — ABNORMAL LOW (ref 39.0–48.0)
HEMOGLOBIN: 12.2 g/dL — ABNORMAL LOW (ref 12.9–16.5)
LYMPHOCYTES ABSOLUTE COUNT: 0.8 10*9/L — ABNORMAL LOW (ref 1.1–3.6)
LYMPHOCYTES RELATIVE PERCENT: 18.4 %
MEAN CORPUSCULAR HEMOGLOBIN CONC: 31.4 g/dL — ABNORMAL LOW (ref 32.0–36.0)
MEAN CORPUSCULAR HEMOGLOBIN: 29.5 pg (ref 25.9–32.4)
MEAN CORPUSCULAR VOLUME: 93.9 fL (ref 77.6–95.7)
MEAN PLATELET VOLUME: 8.4 fL (ref 6.8–10.7)
MONOCYTES ABSOLUTE COUNT: 0.8 10*9/L (ref 0.3–0.8)
MONOCYTES RELATIVE PERCENT: 18.6 %
NEUTROPHILS ABSOLUTE COUNT: 2.4 10*9/L (ref 1.8–7.8)
NEUTROPHILS RELATIVE PERCENT: 55.6 %
NUCLEATED RED BLOOD CELLS: 0 /100{WBCs} (ref <=4)
PLATELET COUNT: 191 10*9/L (ref 150–450)
RED BLOOD CELL COUNT: 4.13 10*12/L — ABNORMAL LOW (ref 4.26–5.60)
RED CELL DISTRIBUTION WIDTH: 15.6 % — ABNORMAL HIGH (ref 12.2–15.2)
WBC ADJUSTED: 4.4 10*9/L (ref 3.6–11.2)

## 2021-08-09 LAB — BASIC METABOLIC PANEL
ANION GAP: 7 mmol/L (ref 5–14)
BLOOD UREA NITROGEN: 24 mg/dL — ABNORMAL HIGH (ref 9–23)
BUN / CREAT RATIO: 14
CALCIUM: 10.5 mg/dL — ABNORMAL HIGH (ref 8.7–10.4)
CHLORIDE: 113 mmol/L — ABNORMAL HIGH (ref 98–107)
CO2: 23.8 mmol/L (ref 20.0–31.0)
CREATININE: 1.7 mg/dL — ABNORMAL HIGH
EGFR CKD-EPI (2021) MALE: 43 mL/min/{1.73_m2} — ABNORMAL LOW (ref >=60)
GLUCOSE RANDOM: 107 mg/dL (ref 70–179)
POTASSIUM: 4.8 mmol/L (ref 3.4–4.8)
SODIUM: 144 mmol/L (ref 135–145)

## 2021-08-09 LAB — PHOSPHORUS: PHOSPHORUS: 2.7 mg/dL (ref 2.4–5.1)

## 2021-08-09 LAB — TACROLIMUS LEVEL, TROUGH: TACROLIMUS, TROUGH: 5.5 ng/mL (ref 5.0–15.0)

## 2021-08-09 LAB — MAGNESIUM: MAGNESIUM: 1.7 mg/dL (ref 1.6–2.6)

## 2021-08-10 LAB — CMV DNA, QUANTITATIVE, PCR
CMV QUANT LOG10: 1.86 {Log_IU}/mL — ABNORMAL HIGH (ref <0.00)
CMV QUANT: 72 [IU]/mL — ABNORMAL HIGH (ref <0)
CMV VIRAL LD: DETECTED — AB

## 2021-08-30 NOTE — Unmapped (Signed)
[facility] Refill Coordination Note    Specialty Medication(s) to be Shipped:   Transplant: Envarsus 4mg  and mycophenolate mofetil 180mg     Other medication(s) to be shipped: jardiance, magnesium and olmesartan     Frank Leach, DOB: 01-22-1951  Phone: [phone] (home)       All above HIPAA information was verified with patient.     Was a Nurse, learning disability used for this call? No    Completed refill call assessment today to schedule patient's medication shipment from the [facility] [phone]).  All relevant notes have been reviewed.     Specialty medication(s) and dose(s) confirmed: Regimen is correct and unchanged.   Changes to medications: Luman reports no changes at this time.  Changes to insurance: No  New side effects reported not previously addressed with a pharmacist or physician: None reported  Questions for the pharmacist: No    Confirmed patient received a Conservation officer, historic buildings and a Surveyor, mining with first shipment. The patient will receive a drug information handout for each medication shipped and additional FDA Medication Guides as required.       DISEASE/MEDICATION-SPECIFIC INFORMATION        N/A    SPECIALTY MEDICATION ADHERENCE     Medication Adherence    Patient reported X missed doses in the last month: 0  Specialty Medication: ENVARSUS XR 4 mg Tb24 extended release tablet (tacrolimus)  Patient is on additional specialty medications: Yes  Additional Specialty Medications: mycophenolate 180 MG EC tablet (MYFORTIC)  Patient Reported Additional Medication X Missed Doses in the Last Month: 0  Patient is on more than two specialty medications: No        Were doses missed due to medication being on hold? No    Envarsus 4 mg: 4 days of medicine on hand   Mycophenolate 180 mg: 4 days of medicine on hand     REFERRAL TO PHARMACIST     Referral to the pharmacist: Not needed      [facility]     Shipping address confirmed in Epic.     Delivery Scheduled: Yes, Expected medication delivery date: 09/02/2021.     Medication will be delivered via Next Day Courier to the prescription address in Epic WAM.    Frank Leach   [facility] Specialty Technician

## 2021-09-01 MED FILL — MG-PLUS-PROTEIN 133 MG TABLET: ORAL | 50 days supply | Qty: 100 | Fill #2

## 2021-09-01 MED FILL — ENVARSUS XR 4 MG TABLET,EXTENDED RELEASE: ORAL | 30 days supply | Qty: 30 | Fill #7

## 2021-09-01 MED FILL — MYCOPHENOLATE SODIUM 180 MG TABLET,DELAYED RELEASE: ORAL | 30 days supply | Qty: 120 | Fill #5

## 2021-09-01 MED FILL — OLMESARTAN 20 MG TABLET: ORAL | 30 days supply | Qty: 30 | Fill #1

## 2021-09-01 MED FILL — JARDIANCE 25 MG TABLET: ORAL | 30 days supply | Qty: 30 | Fill #9

## 2021-09-12 ENCOUNTER — Ambulatory Visit: Admit: 2021-09-12 | Discharge: 2021-09-13 | Payer: MEDICARE

## 2021-09-12 LAB — BASIC METABOLIC PANEL
ANION GAP: 5 mmol/L (ref 5–14)
BLOOD UREA NITROGEN: 21 mg/dL (ref 9–23)
BUN / CREAT RATIO: 13
CALCIUM: 10.4 mg/dL (ref 8.7–10.4)
CHLORIDE: 113 mmol/L — ABNORMAL HIGH (ref 98–107)
CO2: 26.4 mmol/L (ref 20.0–31.0)
CREATININE: 1.56 mg/dL — ABNORMAL HIGH
EGFR CKD-EPI (2021) MALE: 48 mL/min/{1.73_m2} — ABNORMAL LOW (ref >=60)
GLUCOSE RANDOM: 110 mg/dL (ref 70–179)
POTASSIUM: 5.1 mmol/L — ABNORMAL HIGH (ref 3.4–4.8)
SODIUM: 144 mmol/L (ref 135–145)

## 2021-09-12 LAB — CBC W/ AUTO DIFF
BASOPHILS ABSOLUTE COUNT: 0 10*9/L (ref 0.0–0.1)
BASOPHILS RELATIVE PERCENT: 0.9 %
EOSINOPHILS ABSOLUTE COUNT: 0.2 10*9/L (ref 0.0–0.5)
EOSINOPHILS RELATIVE PERCENT: 5.1 %
HEMATOCRIT: 39.6 % (ref 39.0–48.0)
HEMOGLOBIN: 12.5 g/dL — ABNORMAL LOW (ref 12.9–16.5)
LYMPHOCYTES ABSOLUTE COUNT: 0.8 10*9/L — ABNORMAL LOW (ref 1.1–3.6)
LYMPHOCYTES RELATIVE PERCENT: 19.4 %
MEAN CORPUSCULAR HEMOGLOBIN CONC: 31.5 g/dL — ABNORMAL LOW (ref 32.0–36.0)
MEAN CORPUSCULAR HEMOGLOBIN: 29.6 pg (ref 25.9–32.4)
MEAN CORPUSCULAR VOLUME: 94.2 fL (ref 77.6–95.7)
MEAN PLATELET VOLUME: 8.5 fL (ref 6.8–10.7)
MONOCYTES ABSOLUTE COUNT: 0.7 10*9/L (ref 0.3–0.8)
MONOCYTES RELATIVE PERCENT: 18.2 %
NEUTROPHILS ABSOLUTE COUNT: 2.3 10*9/L (ref 1.8–7.8)
NEUTROPHILS RELATIVE PERCENT: 56.4 %
NUCLEATED RED BLOOD CELLS: 0 /100{WBCs} (ref <=4)
PLATELET COUNT: 194 10*9/L (ref 150–450)
RED BLOOD CELL COUNT: 4.21 10*12/L — ABNORMAL LOW (ref 4.26–5.60)
RED CELL DISTRIBUTION WIDTH: 15.3 % — ABNORMAL HIGH (ref 12.2–15.2)
WBC ADJUSTED: 4.1 10*9/L (ref 3.6–11.2)

## 2021-09-12 LAB — TACROLIMUS LEVEL, TROUGH: TACROLIMUS, TROUGH: 6.5 ng/mL (ref 5.0–15.0)

## 2021-09-12 LAB — MAGNESIUM: MAGNESIUM: 1.6 mg/dL (ref 1.6–2.6)

## 2021-09-12 LAB — PHOSPHORUS: PHOSPHORUS: 2.9 mg/dL (ref 2.4–5.1)

## 2021-09-13 DIAGNOSIS — Z940 Kidney transplant status: Principal | ICD-10-CM

## 2021-09-13 DIAGNOSIS — Z79899 Other long term (current) drug therapy: Principal | ICD-10-CM

## 2021-09-13 LAB — CMV DNA, QUANTITATIVE, PCR
CMV QUANT LOG10: 2.34 {Log_IU}/mL — ABNORMAL HIGH (ref <0.00)
CMV QUANT: 221 [IU]/mL — ABNORMAL HIGH (ref <0)
CMV VIRAL LD: DETECTED — AB

## 2021-09-21 NOTE — Unmapped (Signed)
Transplant Nephrology Clinic Visit    Subjective/Interval:   Since patient's last visit in the transplant clinic - patient has been doing well in terms of transplant, taking transplant medications regularly, no episodes of rejection and no side effects of medications.    Today patient presents feeling overall well. Home SBP 140s-160s. He has only been taking olmesartan 20 mg daily, though he was suppose to be on 40 mg. BG levels typically in the 120s.       Assessment:  71 y.o. 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       Recommendations/Plan:     Allograft Function: DDKT KDPI: 42%06/22/2019  Renal function holding relatively stable with electrolytes and acid base balanced.   Creatinine 1.81, slightly up. Will closely monitor.    Baseline - 1.4-1.8  Decoy/ BK     neg Date:  05/27/21  DSA     neg Date:  05/27/21  CMV:     <50 Date:  09/28/21, continue to monitor his CMV.  UPC:     0.305 Date:  09/28/21.       Immunosuppression Management [High Risk Medical Decision Making For Drug Therapy Requiring Intensive Monitoring For Toxicity]:   Takes Envarsus at 9:00 a.m and is on 4 mg and Myfortic 360 mg BID.  Tacro target: 4-6  Side effects: tremors.  Level is 4.9 - Continue same dose of Envarsus.    Blood Pressure Management: Home BPs have been elevated 140s-160s. 180/58 in clinic today.  On coreg to 6.25 mg PO BID and olmesartan 20 mg PO every day.  Increase olmesartan to 40 mg daily.    Lipid Management: Last lipid panel on 05/27/2021. The 10-year ASCVD risk score (Arnett DK, et al., 2019) is: 49.7%  Currently taking atorvastatin     Electrolytes: K stable at 4.6    RCC s/p bilateral nephrectomy:   CT RMP 08/29/2018 showed no suspicious enhancing lesions.   Repeat CT findings from 04/07/20 were overall stable.    D.M -not well controlled. A1C - 5.9  Continue Jardiance + metformin.    Health Maintenance:   Colonoscopy: 04/30/2018 - 1 adenomatous polyp; repeat 5-10 years    Immunizations:   Flu Shot: Received fall 2022 per patient report.  Prevnar 13: 10/30/2013  Pneumovax: 04/24/2016, due 2022  No immunizations till 1 year post transplant.  COVID-19 (Moderna): 09/12/19, 10/14/19; booster received 04/07/20, 10/2020, 05/2021    History of Present Illness    Frank Leach here for follow up after kidney transplantation.  Patient has ESRD secondary to HTN.  Patient has been on dialysis since 01/25/2012.  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 Patient was admitted for kidney transplant on 06/21/2019.      Transplant History:    Organ Received: Left DDKT, DBD, PHS, KDPI: 42%; 21h cold ischemia  Native Kidney Disease: HTN; cPRA: 0%  Date of Transplant: 06/22/2019  Post-Transplant Course: HD once for high potassium  Prior Transplants: none  Induction: [place]  Date of Ureteral Stent Removal: 07/29/2019  CMV/EBV Status: CMV D+/R+  EBV D+/R+  Rejection Episodes: none  Donor Specific Antibodies: none  Results of Renal Imaging (pre and post):   Pre-Txp 08/29/2018 CT RMP  Sequela of bilateral nephrectomy. Interval decrease in linear soft tissue within the right nephrectomy bed, potentially postsurgical or fat necrosis. Unchanged linear tissue within the left nephrectomy bed. No suspicious  enhancing lesions are visualized within the surgical beds    Post-Txp 06/25/2019 (txp kidney only)  The renal transplant was located in the left lower quadrant. Normal size and echogenicity.  No solid masses or calculi. Trace perinephric fluid adjacent to the lower pole of the kidney. Mild pelviectasis  - Perfusion: Using power Doppler, normal perfusion was seen throughout the renal parenchyma.  - Resistive indices in the renal transplant are stable compared with prior examination.  - Main renal artery/iliac artery: Patent. Again noted are 3 renal arteries. Resistive indices within the renal arteries are stable to minimally increased, now at or just above normal limits.  - Main renal vein/iliac vein: Patent    Past Medical History  1. HTN  2. DM II  3. RCC s/p Bilateral nephrectomy (L 04/2016, R 12/2016)  4. GERD  5. HLD  6. 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    Medications  Current Outpatient Medications   Medication Sig Dispense Refill   ??? acetaminophen (TYLENOL) 500 MG tablet Take 1-2 tablets (500-1,000 mg total) by mouth every six (6) hours as needed for pain or fever (> 38C). 100 tablet 0   ??? allopurinoL (ZYLOPRIM) 100 MG tablet Take 1 tablet (100 mg total) by mouth every evening. 90 tablet 3   ??? amLODIPine (NORVASC) 10 MG tablet Take 0.5 tablets (5 mg total) by mouth two (2) times a day. 30 tablet 11   ??? atorvastatin (LIPITOR) 40 MG tablet Take 1 tablet (40 mg total) by mouth daily. 90 tablet 3   ??? carvediloL (COREG) 6.25 MG tablet Take 2 tablets (12.5 mg total) by mouth Two (2) times a day. 360 tablet 3   ??? cholecalciferol, vitamin D3-125 mcg, 5,000 unit,, 125 mcg (5,000 unit) tablet Take 1 tablet (125 mcg total) by mouth daily.     ??? empagliflozin (JARDIANCE) 25 mg tablet Take 1 tablet (25 mg total) by mouth daily. 90 tablet 3   ??? ENVARSUS XR 1 mg Tb24 extended release tablet Take 1 tablet (1 mg total) by mouth in the morning. HOLD- Take 4 mg daily. 90 tablet 11   ??? ENVARSUS XR 4 mg Tb24 extended release tablet Take 1 tablet (4 mg) by mouth daily in addition to one 1 mg tablet for a total daily dose of  5 mg 30 tablet 11   ??? magnesium oxide-Mg AA chelate (MAGNESIUM, AMINO ACID CHELATE,) 133 mg Take 1 tablet by mouth two (2) times a day. 100 tablet 11   ??? metFORMIN (GLUCOPHAGE-XR) 500 MG 24 hr tablet Take 2 tablets (1,000 mg total) by mouth daily. 180 tablet 3   ??? mycophenolate (MYFORTIC) 180 MG EC tablet Take 2 tablets (360 mg total) by mouth Two (2) times a day. 120 tablet 5   ??? olmesartan (BENICAR) 20 MG tablet Take 1 tablet (20 mg total) by mouth daily. 90 tablet 3   ??? omeprazole (PRILOSEC) 40 MG capsule Take 1 capsule (40 mg total) by mouth every other day. 45 capsule 3     No current facility-administered medications for this visit.     Physical Exam:   BP 180/58 (BP Site: R Arm, BP Position: Sitting, BP Cuff Size: Medium)  - Pulse 50  - Temp 36.7 ??C (98 ??F) (Temporal)  - Wt Frank.3 kg (155 lb)  - BMI 24.28 kg/m??     Nursing note  and vitals reviewed.   Constitutional: Oriented to person, place, and time. Appears well-developed and well-nourished. No distress.   HENT: Wearing face mask.  Head: Normocephalic and atraumatic.   Eyes: Right eye exhibits no discharge. Left eye exhibits no discharge. No scleral icterus.   Neck: Normal range of motion. Neck supple.   Cardiovascular: Normal rate and regular rhythm. Exam reveals no gallop and no friction rub. No murmur heard.   Pulmonary/Chest: Effort normal and breath sounds normal. No respiratory distress.   Abdominal: Soft. Non tender  Musculoskeletal: Normal range of motion. No edema and no tenderness.   Neurological: Alert and oriented to person, place, and time.   Skin: Skin is warm and dry.   Psychiatric: Normal mood and affect.       Laboratory Data and Imaging reviewed in EPIC    Follow-Up: Return to clinic in 3 months.  Labs: once a month.    Scribe's Attestation: Leeroy Bock, MD obtained and performed the history, physical exam and medical decision making elements that were entered into the chart.  Signed by Lauralee Evener, Scribe, on September 28, 2021 at 8:54 AM.    Documentation assistance provided by the Scribe. I was present during the time the encounter was recorded. The information recorded by the Scribe was done at my direction and has been reviewed and validated by me.

## 2021-09-22 DIAGNOSIS — Z940 Kidney transplant status: Principal | ICD-10-CM

## 2021-09-22 DIAGNOSIS — Z79899 Other long term (current) drug therapy: Principal | ICD-10-CM

## 2021-09-22 MED ORDER — ENVARSUS XR 1 MG TABLET,EXTENDED RELEASE
ORAL_TABLET | Freq: Every day | ORAL | 11 refills | 90 days | Status: CP
Start: 2021-09-22 — End: ?

## 2021-09-22 MED ORDER — MYCOPHENOLATE SODIUM 180 MG TABLET,DELAYED RELEASE
ORAL_TABLET | Freq: Two times a day (BID) | ORAL | 5 refills | 30 days | Status: CP
Start: 2021-09-22 — End: ?
  Filled 2021-09-27: qty 120, 30d supply, fill #0

## 2021-09-22 MED ORDER — METFORMIN ER 500 MG TABLET,EXTENDED RELEASE 24 HR
ORAL_TABLET | Freq: Every day | ORAL | 3 refills | 90.00000 days | Status: CP
Start: 2021-09-22 — End: ?
  Filled 2021-09-27: qty 180, 90d supply, fill #0

## 2021-09-22 NOTE — Unmapped (Signed)
[facility] Refill Coordination Note    Specialty Medication(s) to be Shipped:   Transplant: Envarsus 1mg , Envarsus 4mg  and mycophenolate    Other medication(s) to be shipped: Jardiance, magnesium oxide, olmesartan, allopurinol, metformin     Frank Leach, DOB: 07-Jun-1951  Phone: [phone] (home)       All above HIPAA information was verified with patient.     Was a Nurse, learning disability used for this call? No    Completed refill call assessment today to schedule patient's medication shipment from the [facility] [phone]).  All relevant notes have been reviewed.     Specialty medication(s) and dose(s) confirmed: Regimen is correct and unchanged.   Changes to medications: [name] reports no changes at this time.  Changes to insurance: No  New side effects reported not previously addressed with a pharmacist or physician: None reported  Questions for the pharmacist: No    Confirmed patient received a Conservation officer, historic buildings and a Surveyor, mining with first shipment. The patient will receive a drug information handout for each medication shipped and additional FDA Medication Guides as required.       DISEASE/MEDICATION-SPECIFIC INFORMATION        N/A    SPECIALTY MEDICATION ADHERENCE     Medication Adherence    Patient reported X missed doses in the last month: 0  Specialty Medication: ENVARSUS XR 4 mg Tb24 extended release tablet (tacrolimus)  Patient is on additional specialty medications: Yes  Additional Specialty Medications: mycophenolate 180 MG EC tablet (MYFORTIC)  Patient Reported Additional Medication X Missed Doses in the Last Month: 0  Patient is on more than two specialty medications: Yes  Specialty Medication: Envarsus 1mg   Patient Reported Additional Medication X Missed Doses in the Last Month: 0              Were doses missed due to medication being on hold? No    envarsus 4 mg: 7 days of medicine on hand   envarsus 1 mg: 7 days of medicine on hand Mycophenolate  180 mg: 7 days of medicine on hand        REFERRAL TO PHARMACIST     Referral to the pharmacist: Not needed      [facility]     Shipping address confirmed in Epic.     Delivery Scheduled: Yes, Expected medication delivery date: 09/28/21.  However, Rx request for refills was sent to the provider as there are none remaining.     Medication will be delivered via Next Day Courier to the prescription address in Epic WAM.    [name]   [facility] Specialty Technician

## 2021-09-26 DIAGNOSIS — E0801 Diabetes mellitus due to underlying condition with hyperosmolarity with coma: Principal | ICD-10-CM

## 2021-09-26 DIAGNOSIS — Z794 Long term (current) use of insulin: Principal | ICD-10-CM

## 2021-09-26 DIAGNOSIS — Z79899 Other long term (current) drug therapy: Principal | ICD-10-CM

## 2021-09-26 DIAGNOSIS — Z940 Kidney transplant status: Principal | ICD-10-CM

## 2021-09-27 MED FILL — ENVARSUS XR 1 MG TABLET,EXTENDED RELEASE: ORAL | 90 days supply | Qty: 90 | Fill #0

## 2021-09-27 MED FILL — OLMESARTAN 20 MG TABLET: ORAL | 30 days supply | Qty: 30 | Fill #2

## 2021-09-27 MED FILL — JARDIANCE 25 MG TABLET: ORAL | 30 days supply | Qty: 30 | Fill #10

## 2021-09-27 MED FILL — ALLOPURINOL 100 MG TABLET: ORAL | 90 days supply | Qty: 90 | Fill #1

## 2021-09-27 MED FILL — MG-PLUS-PROTEIN 133 MG TABLET: ORAL | 50 days supply | Qty: 100 | Fill #3

## 2021-09-27 MED FILL — ENVARSUS XR 4 MG TABLET,EXTENDED RELEASE: ORAL | 30 days supply | Qty: 30 | Fill #8

## 2021-09-28 ENCOUNTER — Ambulatory Visit: Admit: 2021-09-28 | Discharge: 2021-09-29 | Payer: MEDICARE | Attending: Nephrology | Primary: Nephrology

## 2021-09-28 ENCOUNTER — Ambulatory Visit: Admit: 2021-09-28 | Discharge: 2021-09-29 | Payer: MEDICARE

## 2021-09-28 DIAGNOSIS — Z79899 Other long term (current) drug therapy: Principal | ICD-10-CM

## 2021-09-28 DIAGNOSIS — Z940 Kidney transplant status: Principal | ICD-10-CM

## 2021-09-28 DIAGNOSIS — E0801 Diabetes mellitus due to underlying condition with hyperosmolarity with coma: Principal | ICD-10-CM

## 2021-09-28 DIAGNOSIS — Z794 Long term (current) use of insulin: Principal | ICD-10-CM

## 2021-09-28 LAB — CBC W/ AUTO DIFF
BASOPHILS ABSOLUTE COUNT: 0.1 10*9/L (ref 0.0–0.1)
BASOPHILS RELATIVE PERCENT: 1.6 %
EOSINOPHILS ABSOLUTE COUNT: 0.2 10*9/L (ref 0.0–0.5)
EOSINOPHILS RELATIVE PERCENT: 5.4 %
HEMATOCRIT: 40.8 % (ref 39.0–48.0)
HEMOGLOBIN: 12.8 g/dL — ABNORMAL LOW (ref 12.9–16.5)
LYMPHOCYTES ABSOLUTE COUNT: 0.6 10*9/L — ABNORMAL LOW (ref 1.1–3.6)
LYMPHOCYTES RELATIVE PERCENT: 16 %
MEAN CORPUSCULAR HEMOGLOBIN CONC: 31.4 g/dL — ABNORMAL LOW (ref 32.0–36.0)
MEAN CORPUSCULAR HEMOGLOBIN: 29.3 pg (ref 25.9–32.4)
MEAN CORPUSCULAR VOLUME: 93.1 fL (ref 77.6–95.7)
MEAN PLATELET VOLUME: 8 fL (ref 6.8–10.7)
MONOCYTES ABSOLUTE COUNT: 0.7 10*9/L (ref 0.3–0.8)
MONOCYTES RELATIVE PERCENT: 17.1 %
NEUTROPHILS ABSOLUTE COUNT: 2.4 10*9/L (ref 1.8–7.8)
NEUTROPHILS RELATIVE PERCENT: 59.9 %
PLATELET COUNT: 210 10*9/L (ref 150–450)
RED BLOOD CELL COUNT: 4.38 10*12/L (ref 4.26–5.60)
RED CELL DISTRIBUTION WIDTH: 15.4 % — ABNORMAL HIGH (ref 12.2–15.2)
WBC ADJUSTED: 4 10*9/L (ref 3.6–11.2)

## 2021-09-28 LAB — URINALYSIS WITH MICROSCOPY
BILIRUBIN UA: NEGATIVE
GLUCOSE UA: >1000 — AB
HYALINE CASTS: 1 /LPF (ref 0–1)
KETONES UA: NEGATIVE
LEUKOCYTE ESTERASE UA: NEGATIVE
NITRITE UA: NEGATIVE
PH UA: 6 (ref 5.0–9.0)
RBC UA: <1 /HPF (ref <3)
RENAL TUBULAR EPITHELIAL CELLS: <1 /HPF — ABNORMAL HIGH (ref <=0)
SPECIFIC GRAVITY UA: 1.015 (ref 1.005–1.030)
SQUAMOUS EPITHELIAL: <1 /HPF (ref 0–5)
UROBILINOGEN UA: 0.2
WBC UA: 2 /HPF — ABNORMAL HIGH (ref <2)

## 2021-09-28 LAB — MAGNESIUM: MAGNESIUM: 1.7 mg/dL (ref 1.6–2.6)

## 2021-09-28 LAB — RENAL FUNCTION PANEL
ALBUMIN: 4.2 g/dL (ref 3.4–5.0)
ANION GAP: 5 mmol/L (ref 5–14)
BLOOD UREA NITROGEN: 27 mg/dL — ABNORMAL HIGH (ref 9–23)
BUN / CREAT RATIO: 15
CALCIUM: 10.5 mg/dL — ABNORMAL HIGH (ref 8.7–10.4)
CHLORIDE: 109 mmol/L — ABNORMAL HIGH (ref 98–107)
CO2: 27.5 mmol/L (ref 20.0–31.0)
CREATININE: 1.81 mg/dL — ABNORMAL HIGH
EGFR CKD-EPI (2021) MALE: 40 mL/min/{1.73_m2} — ABNORMAL LOW (ref >=60)
GLUCOSE RANDOM: 115 mg/dL — ABNORMAL HIGH (ref 70–99)
PHOSPHORUS: 3.1 mg/dL (ref 2.4–5.1)
POTASSIUM: 4.6 mmol/L (ref 3.4–4.8)
SODIUM: 141 mmol/L (ref 135–145)

## 2021-09-28 LAB — PROTEIN / CREATININE RATIO, URINE
CREATININE, URINE: 112.6 mg/dL
PROTEIN URINE: 34.3 mg/dL
PROTEIN/CREAT RATIO, URINE: 0.305

## 2021-09-28 LAB — HEMOGLOBIN A1C
ESTIMATED AVERAGE GLUCOSE: 123 mg/dL
HEMOGLOBIN A1C: 5.9 % — ABNORMAL HIGH (ref 4.8–5.6)

## 2021-09-28 LAB — TACROLIMUS LEVEL, TROUGH: TACROLIMUS, TROUGH: 4.9 ng/mL — ABNORMAL LOW (ref 5.0–15.0)

## 2021-09-28 MED ORDER — OLMESARTAN 40 MG TABLET
ORAL_TABLET | Freq: Every day | ORAL | 3 refills | 90 days | Status: CP
Start: 2021-09-28 — End: 2022-09-28
  Filled 2021-09-30: qty 90, 90d supply, fill #0

## 2021-09-28 NOTE — Unmapped (Signed)
AOBP r arm medium cuff  Average 180/68 (50)  1st 181/68 (50)  2nd 182/69 (51)  3rd 178/67 (50)

## 2021-09-28 NOTE — Unmapped (Signed)
Assessment    Met w/ patient in [facility] today. Reviewed meds/symptoms. Any new medications? none                Fever/cold/flu symptoms denies   BP: 180/58 today/ Home BP reported 130-140/70s - highest 160 systolic   BG: 120-130 - checks every 3 days before breakfast   Headache/Dizziness/Lightheaded: denies   Hand tremors: denines   Numbness/tingling: denies   Fevers/chills/sweats: deniees   CP/SOB/palpatations: denies   Nausea/vomiting/heartburn: denies   Diarrhea/constipation: denies   UTI symptoms (burn/pain/itch/frequency/urgency/odor/color/foam): denies   No visible or palpable edema    Appetite good; reports adequate hydration. 4-5 oz/bottles/fluid per day.    Pt reports being well rested and getting adequate exercise.    Continues to follow Covid/health safety precautions by taking care to mask, perform frequent hand hygeine and minimal public activity. Offered support and guidance for this process given their immune-suppressed state. We discussed reduced covid vaccine coverage for transplant patients and importance of continuing to mask and practice safe distancing. Commented that booster vaccines will likely be advised as an ongoing process.       Last envarsus taken 0900 yesterday; held for this morning's labs. Current dose 4mg  daily; Myfortic 360 mg BID    Other complaints or concerns: none    Referrals needed: none Derm? Gyn? Mammo? GI?    Pt Follow up w/ increased omesartan to 40 mg daily    Immunization status: none today      Functional Score: 100     Able to carry on normal activity;  Minor signs or symptoms of disease.     Employment/work status:  works full time

## 2021-09-29 LAB — CMV DNA, QUANTITATIVE, PCR
CMV QUANT: <50 [IU]/mL — ABNORMAL HIGH (ref <0)
CMV VIRAL LD: DETECTED — AB

## 2021-10-10 ENCOUNTER — Ambulatory Visit: Admit: 2021-10-10 | Discharge: 2021-10-11 | Payer: MEDICARE

## 2021-10-10 LAB — CBC W/ AUTO DIFF
BASOPHILS ABSOLUTE COUNT: 0 10*9/L (ref 0.0–0.1)
BASOPHILS RELATIVE PERCENT: 1.1 %
EOSINOPHILS ABSOLUTE COUNT: 0.2 10*9/L (ref 0.0–0.5)
EOSINOPHILS RELATIVE PERCENT: 4.6 %
HEMATOCRIT: 41.4 % (ref 39.0–48.0)
HEMOGLOBIN: 13.1 g/dL (ref 12.9–16.5)
LYMPHOCYTES ABSOLUTE COUNT: 0.8 10*9/L — ABNORMAL LOW (ref 1.1–3.6)
LYMPHOCYTES RELATIVE PERCENT: 18.3 %
MEAN CORPUSCULAR HEMOGLOBIN CONC: 31.7 g/dL — ABNORMAL LOW (ref 32.0–36.0)
MEAN CORPUSCULAR HEMOGLOBIN: 29.4 pg (ref 25.9–32.4)
MEAN CORPUSCULAR VOLUME: 92.8 fL (ref 77.6–95.7)
MEAN PLATELET VOLUME: 7.7 fL (ref 6.8–10.7)
MONOCYTES ABSOLUTE COUNT: 0.7 10*9/L (ref 0.3–0.8)
MONOCYTES RELATIVE PERCENT: 17.8 %
NEUTROPHILS ABSOLUTE COUNT: 2.4 10*9/L (ref 1.8–7.8)
NEUTROPHILS RELATIVE PERCENT: 58.2 %
NUCLEATED RED BLOOD CELLS: 0 /100{WBCs} (ref <=4)
PLATELET COUNT: 205 10*9/L (ref 150–450)
RED BLOOD CELL COUNT: 4.46 10*12/L (ref 4.26–5.60)
RED CELL DISTRIBUTION WIDTH: 15.2 % (ref 12.2–15.2)
WBC ADJUSTED: 4.2 10*9/L (ref 3.6–11.2)

## 2021-10-10 LAB — BASIC METABOLIC PANEL
ANION GAP: 6 mmol/L (ref 5–14)
BLOOD UREA NITROGEN: 23 mg/dL (ref 9–23)
BUN / CREAT RATIO: 14
CALCIUM: 10.4 mg/dL (ref 8.7–10.4)
CHLORIDE: 111 mmol/L — ABNORMAL HIGH (ref 98–107)
CO2: 25.3 mmol/L (ref 20.0–31.0)
CREATININE: 1.66 mg/dL — ABNORMAL HIGH
EGFR CKD-EPI (2021) MALE: 44 mL/min/{1.73_m2} — ABNORMAL LOW (ref >=60)
GLUCOSE RANDOM: 113 mg/dL (ref 70–179)
POTASSIUM: 4.9 mmol/L — ABNORMAL HIGH (ref 3.4–4.8)
SODIUM: 142 mmol/L (ref 135–145)

## 2021-10-10 LAB — MAGNESIUM: MAGNESIUM: 1.6 mg/dL (ref 1.6–2.6)

## 2021-10-10 LAB — PHOSPHORUS: PHOSPHORUS: 2.9 mg/dL (ref 2.4–5.1)

## 2021-10-10 LAB — TACROLIMUS LEVEL, TROUGH: TACROLIMUS, TROUGH: 5.3 ng/mL (ref 5.0–15.0)

## 2021-10-11 LAB — CMV DNA, QUANTITATIVE, PCR
CMV QUANT LOG10: 1.72 {Log_IU}/mL — ABNORMAL HIGH (ref <0.00)
CMV QUANT: 53 [IU]/mL — ABNORMAL HIGH (ref <0)
CMV VIRAL LD: DETECTED — AB

## 2021-10-25 MED ORDER — JARDIANCE 25 MG TABLET
ORAL_TABLET | Freq: Every day | ORAL | 3 refills | 90 days
Start: 2021-10-25 — End: ?

## 2021-10-25 NOTE — Unmapped (Signed)
[facility] Refill Coordination Note    Specialty Medication(s) to be Shipped:   Transplant: Envarsus 4mg     Other medication(s) to be shipped: atorvastatin and jardiance (needs refills)     Frank Leach, DOB: [birth year]  Phone: [phone] (home)       All above HIPAA information was verified with patient.     Was a Nurse, learning disability used for this call? No    Completed refill call assessment today to schedule patient's medication shipment from the [facility] [phone]).  All relevant notes have been reviewed.     Specialty medication(s) and dose(s) confirmed: Regimen is correct and unchanged.   Changes to medications: [name] reports no changes at this time.  Changes to insurance: No  New side effects reported not previously addressed with a pharmacist or physician: None reported  Questions for the pharmacist: No    Confirmed patient received a Conservation officer, historic buildings and a Surveyor, mining with first shipment. The patient will receive a drug information handout for each medication shipped and additional FDA Medication Guides as required.       DISEASE/MEDICATION-SPECIFIC INFORMATION        N/A    SPECIALTY MEDICATION ADHERENCE     Medication Adherence    Patient reported X missed doses in the last month: 0  Specialty Medication: Envarsus 4mg   Patient is on additional specialty medications: Yes  Additional Specialty Medications: Mycophenolate 180mg   Patient Reported Additional Medication X Missed Doses in the Last Month: 0  Patient is on more than two specialty medications: No  Any gaps in refill history greater than 2 weeks in the last 3 months: no  Demonstrates understanding of importance of adherence: yes  Informant: patient  Patient is at risk for Non-Adherence: No  Confirmed plan for next specialty medication refill: delivery by pharmacy  Refills needed for supportive medications: not needed          Refill Coordination    Has the Patients' Contact Information Changed: No  Is the Shipping Address Different: No         Were doses missed due to medication being on hold? No    Envarsus 4 mg: 14 days of medicine on hand     REFERRAL TO PHARMACIST     Referral to the pharmacist: Not needed      [facility]     Shipping address confirmed in Epic.     Delivery Scheduled: Yes, Expected medication delivery date: 10/28/21.  However, Rx request for refills was sent to the provider as there are none remaining.     Medication will be delivered via Next Day Courier to the prescription address in Epic WAM.    Threasa Beards   [facility] Specialty Technician

## 2021-10-27 MED ORDER — EMPAGLIFLOZIN 25 MG TABLET
ORAL_TABLET | Freq: Every day | ORAL | 3 refills | 90 days | Status: CP
Start: 2021-10-27 — End: ?
  Filled 2021-10-27: qty 30, 30d supply, fill #0

## 2021-10-27 MED FILL — ATORVASTATIN 40 MG TABLET: ORAL | 90 days supply | Qty: 90 | Fill #1

## 2021-10-27 MED FILL — ENVARSUS XR 4 MG TABLET,EXTENDED RELEASE: ORAL | 30 days supply | Qty: 30 | Fill #9

## 2021-11-07 ENCOUNTER — Ambulatory Visit: Admit: 2021-11-07 | Discharge: 2021-11-08 | Payer: MEDICARE

## 2021-11-07 LAB — URINALYSIS WITH MICROSCOPY
BACTERIA: NONE SEEN /HPF
BILIRUBIN UA: NEGATIVE
BLOOD UA: NEGATIVE
GLUCOSE UA: 1000 — AB
HYALINE CASTS: 7 /LPF — ABNORMAL HIGH (ref 0–1)
KETONES UA: NEGATIVE
LEUKOCYTE ESTERASE UA: NEGATIVE
NITRITE UA: NEGATIVE
PH UA: 5.5 (ref 5.0–9.0)
RBC UA: 1 /HPF (ref <=3)
SPECIFIC GRAVITY UA: 1.015 (ref 1.003–1.030)
SQUAMOUS EPITHELIAL: <1 /HPF (ref 0–5)
UROBILINOGEN UA: <2
WBC UA: 1 /HPF (ref <=2)

## 2021-11-07 LAB — CBC W/ AUTO DIFF
BASOPHILS ABSOLUTE COUNT: 0.1 10*9/L (ref 0.0–0.1)
BASOPHILS RELATIVE PERCENT: 1.8 %
EOSINOPHILS ABSOLUTE COUNT: 0.2 10*9/L (ref 0.0–0.5)
EOSINOPHILS RELATIVE PERCENT: 4.7 %
HEMATOCRIT: 40.1 % (ref 39.0–48.0)
HEMOGLOBIN: 13.1 g/dL (ref 12.9–16.5)
LYMPHOCYTES ABSOLUTE COUNT: 0.7 10*9/L — ABNORMAL LOW (ref 1.1–3.6)
LYMPHOCYTES RELATIVE PERCENT: 17.6 %
MEAN CORPUSCULAR HEMOGLOBIN CONC: 32.7 g/dL (ref 32.0–36.0)
MEAN CORPUSCULAR HEMOGLOBIN: 30.1 pg (ref 25.9–32.4)
MEAN CORPUSCULAR VOLUME: 92 fL (ref 77.6–95.7)
MEAN PLATELET VOLUME: 7.8 fL (ref 6.8–10.7)
MONOCYTES ABSOLUTE COUNT: 0.7 10*9/L (ref 0.3–0.8)
MONOCYTES RELATIVE PERCENT: 17.5 %
NEUTROPHILS ABSOLUTE COUNT: 2.3 10*9/L (ref 1.8–7.8)
NEUTROPHILS RELATIVE PERCENT: 58.4 %
NUCLEATED RED BLOOD CELLS: 0 /100{WBCs} (ref <=4)
PLATELET COUNT: 199 10*9/L (ref 150–450)
RED BLOOD CELL COUNT: 4.36 10*12/L (ref 4.26–5.60)
RED CELL DISTRIBUTION WIDTH: 15.3 % — ABNORMAL HIGH (ref 12.2–15.2)
WBC ADJUSTED: 4 10*9/L (ref 3.6–11.2)

## 2021-11-07 LAB — BASIC METABOLIC PANEL
ANION GAP: 5 mmol/L (ref 5–14)
BLOOD UREA NITROGEN: 28 mg/dL — ABNORMAL HIGH (ref 9–23)
BUN / CREAT RATIO: 14
CALCIUM: 10.4 mg/dL (ref 8.7–10.4)
CHLORIDE: 112 mmol/L — ABNORMAL HIGH (ref 98–107)
CO2: 24 mmol/L (ref 20.0–31.0)
CREATININE: 1.98 mg/dL — ABNORMAL HIGH
EGFR CKD-EPI (2021) MALE: 36 mL/min/{1.73_m2} — ABNORMAL LOW (ref >=60)
GLUCOSE RANDOM: 101 mg/dL (ref 70–179)
POTASSIUM: 4.9 mmol/L — ABNORMAL HIGH (ref 3.4–4.8)
SODIUM: 141 mmol/L (ref 135–145)

## 2021-11-07 LAB — MAGNESIUM: MAGNESIUM: 1.4 mg/dL — ABNORMAL LOW (ref 1.6–2.6)

## 2021-11-07 LAB — PHOSPHORUS: PHOSPHORUS: 2.8 mg/dL (ref 2.4–5.1)

## 2021-11-07 LAB — TACROLIMUS LEVEL, TROUGH: TACROLIMUS, TROUGH: 4 ng/mL — ABNORMAL LOW (ref 5.0–15.0)

## 2021-11-09 LAB — CMV DNA, QUANTITATIVE, PCR
CMV QUANT LOG10: 2.06 {Log_IU}/mL — ABNORMAL HIGH (ref <0.00)
CMV QUANT: 115 [IU]/mL — ABNORMAL HIGH (ref <0)
CMV VIRAL LD: DETECTED — AB

## 2021-11-09 MED FILL — MYCOPHENOLATE SODIUM 180 MG TABLET,DELAYED RELEASE: ORAL | 30 days supply | Qty: 120 | Fill #1

## 2021-11-09 NOTE — Unmapped (Signed)
[facility] Refill Coordination Note    Specialty Medication(s) to be Shipped:   Transplant: Myfortic 180mg     Other medication(s) to be shipped: No additional medications requested for fill at this time     Frank Leach, DOB: [birth year]  Phone: [phone] (home)       All above HIPAA information was verified with patient.     Was a Nurse, learning disability used for this call? No    Completed refill call assessment today to schedule patient's medication shipment from the [facility] [phone]).  All relevant notes have been reviewed.     Specialty medication(s) and dose(s) confirmed: Regimen is correct and unchanged.   Changes to medications: Frank Leach reports no changes at this time.  Changes to insurance: No  New side effects reported not previously addressed with a pharmacist or physician: None reported  Questions for the pharmacist: No    Confirmed patient received a Conservation officer, historic buildings and a Surveyor, mining with first shipment. The patient will receive a drug information handout for each medication shipped and additional FDA Medication Guides as required.       DISEASE/MEDICATION-SPECIFIC INFORMATION        N/A    SPECIALTY MEDICATION ADHERENCE     Medication Adherence    Patient reported X missed doses in the last month: 0  Specialty Medication: Mycophenolate  Patient is on additional specialty medications: No  Patient is on more than two specialty medications: No  Any gaps in refill history greater than 2 weeks in the last 3 months: no  Demonstrates understanding of importance of adherence: yes  Informant: patient              Were doses missed due to medication being on hold? No    Mycophenolate 180mg : Patient has 2 days of medication on hand    REFERRAL TO PHARMACIST     Referral to the pharmacist: Not needed      [facility]     Shipping address confirmed in Epic.     Delivery Scheduled: Yes, Expected medication delivery date: 4/6.     Medication will be delivered via Next Day Courier to the prescription address in Epic WAM.    [name]   [facility] Specialty Technician

## 2021-11-24 NOTE — Unmapped (Signed)
[facility] Refill Coordination Note    Specialty Medication(s) to be Shipped:   Transplant: Envarsus 4mg     Other medication(s) to be shipped:  [name], DOB: 02/23/1951  Phone: [phone] (home)       All above HIPAA information was verified with patient.     Was a Nurse, learning disability used for this call? No    Completed refill call assessment today to schedule patient's medication shipment from the [facility] [phone]).  All relevant notes have been reviewed.     Specialty medication(s) and dose(s) confirmed: Regimen is correct and unchanged.   Changes to medications: Liev reports no changes at this time.  Changes to insurance: No  New side effects reported not previously addressed with a pharmacist or physician: None reported  Questions for the pharmacist: No    Confirmed patient received a Conservation officer, historic buildings and a Surveyor, mining with first shipment. The patient will receive a drug information handout for each medication shipped and additional FDA Medication Guides as required.       DISEASE/MEDICATION-SPECIFIC INFORMATION        N/A    SPECIALTY MEDICATION ADHERENCE     Medication Adherence    Patient reported X missed doses in the last month: 0  Specialty Medication: Envarsus 4mg   Patient is on additional specialty medications: No              Were doses missed due to medication being on hold? No  Envarsus 4mg  8 days worth of medication on hand.      REFERRAL TO PHARMACIST     Referral to the pharmacist: Not needed      [facility]     Shipping address confirmed in Epic.     Delivery Scheduled: Yes, Expected medication delivery date: 11/29/21.     Medication will be delivered via UPS to the prescription address in Epic WAM.    Frank Leach   [facility] Specialty Technician

## 2021-11-28 MED FILL — JARDIANCE 25 MG TABLET: ORAL | 30 days supply | Qty: 30 | Fill #1

## 2021-11-28 MED FILL — ENVARSUS XR 4 MG TABLET,EXTENDED RELEASE: ORAL | 30 days supply | Qty: 30 | Fill #10

## 2021-11-29 NOTE — Unmapped (Signed)
[facility] Clinical Assessment & Refill Coordination Note    Frank Leach, [name]: Feb 09, 1951  Phone: [phone] (home)     All above HIPAA information was verified with patient.     Was a Nurse, learning disability used for this call? No    Specialty Medication(s):   Transplant: Envarsus 1mg , Envarsus 4mg , and  mycophenolic acid 180mg      Current Outpatient Medications   Medication Sig Dispense Refill    acetaminophen (TYLENOL) 500 MG tablet Take 1-2 tablets (500-1,000 mg total) by mouth every six (6) hours as needed for pain or fever (> 38C). 100 tablet 0    allopurinoL (ZYLOPRIM) 100 MG tablet Take 1 tablet (100 mg total) by mouth every evening. 90 tablet 3    amLODIPine (NORVASC) 10 MG tablet Take 0.5 tablets (5 mg total) by mouth two (2) times a day. 30 tablet 11    atorvastatin (LIPITOR) 40 MG tablet Take 1 tablet (40 mg total) by mouth daily. 90 tablet 3    carvediloL (COREG) 6.25 MG tablet Take 2 tablets (12.5 mg total) by mouth Two (2) times a day. 360 tablet 3    cholecalciferol, vitamin D3-125 mcg, 5,000 unit,, 125 mcg (5,000 unit) tablet Take 1 tablet (125 mcg total) by mouth daily.      empagliflozin (JARDIANCE) 25 mg tablet Take 1 tablet (25 mg total) by mouth daily. 90 tablet 3    ENVARSUS XR 1 mg Tb24 extended release tablet Take 1 tablet (1 mg total) by mouth in the morning. HOLD- Take 4 mg daily. 90 tablet 11    ENVARSUS XR 1 mg Tb24 extended release tablet Take 1 tablet (1 mg) by mouth daily in addition to one 4 mg tablet  for a total daily dose of 5 mg 90 tablet 11    ENVARSUS XR 4 mg Tb24 extended release tablet Take 1 tablet (4 mg) by mouth daily in addition to one 1 mg tablet for a total daily dose of  5 mg 30 tablet 11    magnesium oxide-Mg AA chelate (MAGNESIUM, AMINO ACID CHELATE,) 133 mg Take 1 tablet by mouth two (2) times a day. 100 tablet 11    metFORMIN (GLUCOPHAGE-XR) 500 MG 24 hr tablet Take 2 tablets (1,000 mg total) by mouth daily. 180 tablet 3 mycophenolate (MYFORTIC) 180 MG EC tablet Take 2 tablets (360 mg total) by mouth Two (2) times a day. 120 tablet 5    olmesartan (BENICAR) 40 MG tablet Take 1 tablet (40 mg total) by mouth daily. 90 tablet 3    omeprazole (PRILOSEC) 40 MG capsule Take 1 capsule (40 mg total) by mouth every other day. 45 capsule 3     No current facility-administered medications for this visit.        Changes to medications: Savion reports no changes at this time.    No Known Allergies    Changes to allergies: No    SPECIALTY MEDICATION ADHERENCE     Envarsus Xr 1 mg: 30 days of medicine on hand   Envarsus Xr 4 mg: 30 days of medicine on hand   Mycophenolate 180 mg: 5 days of medicine on hand     Medication Adherence    Patient reported X missed doses in the last month: 0  Specialty Medication: Mycophenolate 180mg   Patient is on additional specialty medications: Yes  Additional Specialty Medications: Envarsus Xr 1mg   Patient Reported Additional Medication X Missed Doses in the Last Month: 0  Patient is  on more than two specialty medications: Yes  Specialty Medication: Envarsus Xr 4mg   Patient Reported Additional Medication X Missed Doses in the Last Month: 0          Specialty medication(s) dose(s) confirmed: Regimen is correct and unchanged.     Are there any concerns with adherence? No    Adherence counseling provided? Not needed    CLINICAL MANAGEMENT AND INTERVENTION      Clinical Benefit Assessment:    Do you feel the medicine is effective or helping your condition? Yes    Clinical Benefit counseling provided? Not needed    Adverse Effects Assessment:    Are you experiencing any side effects? No    Are you experiencing difficulty administering your medicine? No    Quality of Life Assessment:           How many days over the past month did your kidney transplant  keep you from your normal activities? For example, brushing your teeth or getting up in the morning. 0    Have you discussed this with your provider? Not needed    Acute Infection Status:    Acute infections noted within Epic:  No active infections  Patient reported infection: None    Therapy Appropriateness:    Is therapy appropriate and patient progressing towards therapeutic goals? Yes, therapy is appropriate and should be continued    DISEASE/MEDICATION-SPECIFIC INFORMATION      N/A    PATIENT SPECIFIC NEEDS     Does the patient have any physical, cognitive, or cultural barriers? No    Is the patient high risk? Yes, patient is taking a REMS drug. Medication is dispensed in compliance with REMS program    Does the patient require a Care Management Plan? No     SHIPPING     Specialty Medication(s) to be Shipped:   Transplant:  mycophenolic acid 180mg   Patient declined MG and Carvedilol today.    Other medication(s) to be shipped: No additional medications requested for fill at this time     Changes to insurance: No    Delivery Scheduled: Yes, Expected medication delivery date: 12/02/21.     Medication will be delivered via Same Day Courier to the confirmed prescription address in [facility].    The patient will receive a drug information handout for each medication shipped and additional FDA Medication Guides as required.  Verified that patient has previously received a Conservation officer, historic buildings and a Surveyor, mining.    The patient or caregiver noted above participated in the development of this care plan and knows that they can request review of or adjustments to the care plan at any time.      All of the patient's questions and concerns have been addressed.    Tera Helper   [facility] Specialty Pharmacist

## 2021-12-02 MED FILL — MYCOPHENOLATE SODIUM 180 MG TABLET,DELAYED RELEASE: ORAL | 30 days supply | Qty: 120 | Fill #2

## 2021-12-05 ENCOUNTER — Ambulatory Visit: Admit: 2021-12-05 | Discharge: 2021-12-06 | Payer: MEDICARE

## 2021-12-05 LAB — BASIC METABOLIC PANEL
ANION GAP: 7 mmol/L (ref 5–14)
BLOOD UREA NITROGEN: 29 mg/dL — ABNORMAL HIGH (ref 9–23)
BUN / CREAT RATIO: 15
CALCIUM: 10.5 mg/dL — ABNORMAL HIGH (ref 8.7–10.4)
CHLORIDE: 111 mmol/L — ABNORMAL HIGH (ref 98–107)
CO2: 24.7 mmol/L (ref 20.0–31.0)
CREATININE: 1.9 mg/dL — ABNORMAL HIGH
EGFR CKD-EPI (2021) MALE: 38 mL/min/{1.73_m2} — ABNORMAL LOW (ref >=60)
GLUCOSE RANDOM: 107 mg/dL (ref 70–179)
POTASSIUM: 4.6 mmol/L (ref 3.4–4.8)
SODIUM: 143 mmol/L (ref 135–145)

## 2021-12-05 LAB — CBC W/ AUTO DIFF
BASOPHILS ABSOLUTE COUNT: 0.1 10*9/L (ref 0.0–0.1)
BASOPHILS RELATIVE PERCENT: 1.7 %
EOSINOPHILS ABSOLUTE COUNT: 0.2 10*9/L (ref 0.0–0.5)
EOSINOPHILS RELATIVE PERCENT: 4.3 %
HEMATOCRIT: 39.8 % (ref 39.0–48.0)
HEMOGLOBIN: 12.7 g/dL — ABNORMAL LOW (ref 12.9–16.5)
LYMPHOCYTES ABSOLUTE COUNT: 0.6 10*9/L — ABNORMAL LOW (ref 1.1–3.6)
LYMPHOCYTES RELATIVE PERCENT: 13.3 %
MEAN CORPUSCULAR HEMOGLOBIN CONC: 31.9 g/dL — ABNORMAL LOW (ref 32.0–36.0)
MEAN CORPUSCULAR HEMOGLOBIN: 29.3 pg (ref 25.9–32.4)
MEAN CORPUSCULAR VOLUME: 91.8 fL (ref 77.6–95.7)
MEAN PLATELET VOLUME: 8 fL (ref 6.8–10.7)
MONOCYTES ABSOLUTE COUNT: 0.5 10*9/L (ref 0.3–0.8)
MONOCYTES RELATIVE PERCENT: 10.1 %
NEUTROPHILS ABSOLUTE COUNT: 3.2 10*9/L (ref 1.8–7.8)
NEUTROPHILS RELATIVE PERCENT: 70.6 %
NUCLEATED RED BLOOD CELLS: 0 /100{WBCs} (ref <=4)
PLATELET COUNT: 201 10*9/L (ref 150–450)
RED BLOOD CELL COUNT: 4.34 10*12/L (ref 4.26–5.60)
RED CELL DISTRIBUTION WIDTH: 15.8 % — ABNORMAL HIGH (ref 12.2–15.2)
WBC ADJUSTED: 4.5 10*9/L (ref 3.6–11.2)

## 2021-12-05 LAB — MAGNESIUM: MAGNESIUM: 1.5 mg/dL — ABNORMAL LOW (ref 1.6–2.6)

## 2021-12-05 LAB — PHOSPHORUS: PHOSPHORUS: 3.4 mg/dL (ref 2.4–5.1)

## 2021-12-05 LAB — TACROLIMUS LEVEL, TROUGH: TACROLIMUS, TROUGH: 4.8 ng/mL — ABNORMAL LOW (ref 5.0–15.0)

## 2021-12-08 LAB — CMV DNA, QUANTITATIVE, PCR
CMV QUANT LOG10: 2.29 {Log_IU}/mL — ABNORMAL HIGH (ref <0.00)
CMV QUANT: 197 [IU]/mL — ABNORMAL HIGH (ref <0)
CMV VIRAL LD: DETECTED — AB

## 2021-12-22 MED FILL — JARDIANCE 25 MG TABLET: ORAL | 30 days supply | Qty: 30 | Fill #2

## 2021-12-22 MED FILL — ENVARSUS XR 4 MG TABLET,EXTENDED RELEASE: ORAL | 30 days supply | Qty: 30 | Fill #11

## 2021-12-22 MED FILL — MG-PLUS-PROTEIN 133 MG TABLET: ORAL | 50 days supply | Qty: 100 | Fill #4

## 2021-12-22 NOTE — Unmapped (Signed)
[facility] Refill Coordination Note    Specialty Medication(s) to be Shipped:   Transplant: Envarsus XR 4mg     Other medication(s) to be shipped: JARDIANCE 25 mg tablet, magnesium (amino acid chelate) 133 mg (magnesium oxide-Mg AA chelate)     Frank Leach, DOB: 24-Jun-1951  Phone: [phone] (home)       All above HIPAA information was verified with patient.     Was a Nurse, learning disability used for this call? No    Completed refill call assessment today to schedule patient's medication shipment from the [facility] [phone]).  All relevant notes have been reviewed.     Specialty medication(s) and dose(s) confirmed: Regimen is correct and unchanged.   Changes to medications: Frank Leach reports no changes at this time.  Changes to insurance: No  New side effects reported not previously addressed with a pharmacist or physician: None reported  Questions for the pharmacist: No    Confirmed patient received a Conservation officer, historic buildings and a Surveyor, mining with first shipment. The patient will receive a drug information handout for each medication shipped and additional FDA Medication Guides as required.       DISEASE/MEDICATION-SPECIFIC INFORMATION        N/A    SPECIALTY MEDICATION ADHERENCE     Medication Adherence    Patient reported X missed doses in the last month: 0  Specialty Medication: ENVARSUS XR 4 mg  Patient is on additional specialty medications: No              Were doses missed due to medication being on hold? No    Envarsus  XR 4 mg: 7 days of medicine on hand        REFERRAL TO PHARMACIST     Referral to the pharmacist: Not needed      [facility]     Shipping address confirmed in Epic.     Delivery Scheduled: Yes, Expected medication delivery date: 12/27/21.     Medication will be delivered via Next Day Courier to the prescription address in Epic [facility].    Willette Pa   [facility] Specialty Technician

## 2022-01-05 ENCOUNTER — Ambulatory Visit: Admit: 2022-01-05 | Discharge: 2022-01-06 | Payer: MEDICARE

## 2022-01-05 LAB — CBC W/ AUTO DIFF
BASOPHILS ABSOLUTE COUNT: 0.1 10*9/L (ref 0.0–0.1)
BASOPHILS RELATIVE PERCENT: 1.8 %
EOSINOPHILS ABSOLUTE COUNT: 0.2 10*9/L (ref 0.0–0.5)
EOSINOPHILS RELATIVE PERCENT: 4.8 %
HEMATOCRIT: 37.7 % — ABNORMAL LOW (ref 39.0–48.0)
HEMOGLOBIN: 11.9 g/dL — ABNORMAL LOW (ref 12.9–16.5)
LYMPHOCYTES ABSOLUTE COUNT: 0.6 10*9/L — ABNORMAL LOW (ref 1.1–3.6)
LYMPHOCYTES RELATIVE PERCENT: 17.8 %
MEAN CORPUSCULAR HEMOGLOBIN CONC: 31.6 g/dL — ABNORMAL LOW (ref 32.0–36.0)
MEAN CORPUSCULAR HEMOGLOBIN: 28.8 pg (ref 25.9–32.4)
MEAN CORPUSCULAR VOLUME: 91.1 fL (ref 77.6–95.7)
MEAN PLATELET VOLUME: 7.9 fL (ref 6.8–10.7)
MONOCYTES ABSOLUTE COUNT: 0.6 10*9/L (ref 0.3–0.8)
MONOCYTES RELATIVE PERCENT: 17 %
NEUTROPHILS ABSOLUTE COUNT: 2 10*9/L (ref 1.8–7.8)
NEUTROPHILS RELATIVE PERCENT: 58.6 %
NUCLEATED RED BLOOD CELLS: 0 /100{WBCs} (ref <=4)
PLATELET COUNT: 214 10*9/L (ref 150–450)
RED BLOOD CELL COUNT: 4.14 10*12/L — ABNORMAL LOW (ref 4.26–5.60)
RED CELL DISTRIBUTION WIDTH: 16.1 % — ABNORMAL HIGH (ref 12.2–15.2)
WBC ADJUSTED: 3.5 10*9/L — ABNORMAL LOW (ref 3.6–11.2)

## 2022-01-05 LAB — BASIC METABOLIC PANEL
ANION GAP: 5 mmol/L (ref 5–14)
BLOOD UREA NITROGEN: 28 mg/dL — ABNORMAL HIGH (ref 9–23)
BUN / CREAT RATIO: 13
CALCIUM: 10.2 mg/dL (ref 8.7–10.4)
CHLORIDE: 114 mmol/L — ABNORMAL HIGH (ref 98–107)
CO2: 23.1 mmol/L (ref 20.0–31.0)
CREATININE: 2.15 mg/dL — ABNORMAL HIGH
EGFR CKD-EPI (2021) MALE: 33 mL/min/{1.73_m2} — ABNORMAL LOW (ref >=60)
GLUCOSE RANDOM: 98 mg/dL (ref 70–179)
POTASSIUM: 4.7 mmol/L (ref 3.4–4.8)
SODIUM: 142 mmol/L (ref 135–145)

## 2022-01-05 LAB — MAGNESIUM: MAGNESIUM: 1.6 mg/dL (ref 1.6–2.6)

## 2022-01-05 LAB — TACROLIMUS LEVEL, TROUGH: TACROLIMUS, TROUGH: 5.6 ng/mL (ref 5.0–15.0)

## 2022-01-05 LAB — PHOSPHORUS: PHOSPHORUS: 3 mg/dL (ref 2.4–5.1)

## 2022-01-05 MED FILL — ALLOPURINOL 100 MG TABLET: ORAL | 90 days supply | Qty: 90 | Fill #2

## 2022-01-05 MED FILL — METFORMIN ER 500 MG TABLET,EXTENDED RELEASE 24 HR: ORAL | 90 days supply | Qty: 180 | Fill #1

## 2022-01-05 MED FILL — MYCOPHENOLATE SODIUM 180 MG TABLET,DELAYED RELEASE: ORAL | 30 days supply | Qty: 120 | Fill #3

## 2022-01-05 MED FILL — ENVARSUS XR 1 MG TABLET,EXTENDED RELEASE: ORAL | 90 days supply | Qty: 90 | Fill #1

## 2022-01-05 MED FILL — OLMESARTAN 40 MG TABLET: ORAL | 90 days supply | Qty: 90 | Fill #1

## 2022-01-05 NOTE — Unmapped (Signed)
[facility] Refill Coordination Note    Specialty Medication(s) to be Shipped:   Transplant: Envarsus XR 1mg  and General Specialty: mycophenolate 180mg     Other medication(s) to be shipped: Allopurinol 100mg , Metformin 500mg , Olmesartan 40mg      Frank Leach, DOB: Sep 26, 1950  Phone: [phone] (home)       All above HIPAA information was verified with patient's family member, Wife.     Was a Nurse, learning disability used for this call? No    Completed refill call assessment today to schedule patient's medication shipment from the [facility] [phone]).  All relevant notes have been reviewed.     Specialty medication(s) and dose(s) confirmed: Regimen is correct and unchanged.   Changes to medications: Frank Leach reports no changes at this time.  Changes to insurance: No  New side effects reported not previously addressed with a pharmacist or physician: None reported  Questions for the pharmacist: No    Confirmed patient received a Conservation officer, historic buildings and a Surveyor, mining with first shipment. The patient will receive a drug information handout for each medication shipped and additional FDA Medication Guides as required.       DISEASE/MEDICATION-SPECIFIC INFORMATION        N/A    SPECIALTY MEDICATION ADHERENCE     Medication Adherence    Specialty Medication: Envarsus XR 1mg   Patient is on additional specialty medications: Yes  Additional Specialty Medications: Mycophenolate 180mg   Patient is on more than two specialty medications: No              Were doses missed due to medication being on hold? No    Mycophenolate 180 mg: unknown days of medicine on hand   Envarsus XR 1 mg: 0 days of medicine on hand       REFERRAL TO PHARMACIST     Referral to the pharmacist: Not needed      [facility]     Shipping address confirmed in Epic.     Delivery Scheduled: Yes, Expected medication delivery date: 01/06/22.     Medication will be delivered via Next Day Courier to the prescription address in Epic WAM.    Nancy Nordmann [facility] Specialty Technician

## 2022-01-07 LAB — CMV DNA, QUANTITATIVE, PCR
CMV QUANT LOG10: 1.93 {Log_IU}/mL — ABNORMAL HIGH (ref <0.00)
CMV QUANT: 85 [IU]/mL — ABNORMAL HIGH (ref <0)
CMV VIRAL LD: DETECTED — AB

## 2022-01-09 NOTE — Unmapped (Signed)
Called patient to checki n after recent labs showed elevated cr 2.15.  Left pt detailed VM to call with any recent health concerns or questions.  Otherwise,plan to repeat labs on Friday 6/9 at clinic visit with Dr. Nestor Lewandowsky

## 2022-01-10 DIAGNOSIS — Z79899 Other long term (current) drug therapy: Principal | ICD-10-CM

## 2022-01-10 DIAGNOSIS — Z940 Kidney transplant status: Principal | ICD-10-CM

## 2022-01-13 ENCOUNTER — Ambulatory Visit: Admit: 2022-01-13 | Discharge: 2022-01-13 | Payer: MEDICARE

## 2022-01-13 DIAGNOSIS — E0801 Diabetes mellitus due to underlying condition with hyperosmolarity with coma: Principal | ICD-10-CM

## 2022-01-13 DIAGNOSIS — Z79899 Other long term (current) drug therapy: Principal | ICD-10-CM

## 2022-01-13 DIAGNOSIS — Z794 Long term (current) use of insulin: Principal | ICD-10-CM

## 2022-01-13 DIAGNOSIS — Z940 Kidney transplant status: Principal | ICD-10-CM

## 2022-01-13 DIAGNOSIS — I10 Essential (primary) hypertension: Principal | ICD-10-CM

## 2022-01-13 DIAGNOSIS — R109 Unspecified abdominal pain: Principal | ICD-10-CM

## 2022-01-13 LAB — URINALYSIS WITH MICROSCOPY
BACTERIA: NONE SEEN /HPF
BILIRUBIN UA: NEGATIVE
BLOOD UA: NEGATIVE
GLUCOSE UA: >1000 — AB
KETONES UA: NEGATIVE
LEUKOCYTE ESTERASE UA: NEGATIVE
NITRITE UA: NEGATIVE
PH UA: 5.5 (ref 5.0–9.0)
RBC UA: <1 /HPF (ref <3)
SPECIFIC GRAVITY UA: 1.015 (ref 1.005–1.030)
SQUAMOUS EPITHELIAL: <1 /HPF (ref 0–5)
UROBILINOGEN UA: 0.2
WBC UA: 1 /HPF (ref <2)

## 2022-01-13 LAB — BASIC METABOLIC PANEL
ANION GAP: 6 mmol/L (ref 5–14)
BLOOD UREA NITROGEN: 35 mg/dL — ABNORMAL HIGH (ref 9–23)
BUN / CREAT RATIO: 16
CALCIUM: 10.6 mg/dL — ABNORMAL HIGH (ref 8.7–10.4)
CHLORIDE: 113 mmol/L — ABNORMAL HIGH (ref 98–107)
CO2: 22.6 mmol/L (ref 20.0–31.0)
CREATININE: 2.21 mg/dL — ABNORMAL HIGH
EGFR CKD-EPI (2021) MALE: 31 mL/min/{1.73_m2} — ABNORMAL LOW (ref >=60)
GLUCOSE RANDOM: 101 mg/dL (ref 70–179)
POTASSIUM: 5 mmol/L — ABNORMAL HIGH (ref 3.4–4.8)
SODIUM: 142 mmol/L (ref 135–145)

## 2022-01-13 LAB — CBC W/ AUTO DIFF
BASOPHILS ABSOLUTE COUNT: 0.1 10*9/L (ref 0.0–0.1)
BASOPHILS RELATIVE PERCENT: 1.2 %
EOSINOPHILS ABSOLUTE COUNT: 0.2 10*9/L (ref 0.0–0.5)
EOSINOPHILS RELATIVE PERCENT: 4.8 %
HEMATOCRIT: 40.6 % (ref 39.0–48.0)
HEMOGLOBIN: 12.6 g/dL — ABNORMAL LOW (ref 12.9–16.5)
LYMPHOCYTES ABSOLUTE COUNT: 0.8 10*9/L — ABNORMAL LOW (ref 1.1–3.6)
LYMPHOCYTES RELATIVE PERCENT: 16.2 %
MEAN CORPUSCULAR HEMOGLOBIN CONC: 31.1 g/dL — ABNORMAL LOW (ref 32.0–36.0)
MEAN CORPUSCULAR HEMOGLOBIN: 28.8 pg (ref 25.9–32.4)
MEAN CORPUSCULAR VOLUME: 92.6 fL (ref 77.6–95.7)
MEAN PLATELET VOLUME: 8.1 fL (ref 6.8–10.7)
MONOCYTES ABSOLUTE COUNT: 0.7 10*9/L (ref 0.3–0.8)
MONOCYTES RELATIVE PERCENT: 15.5 %
NEUTROPHILS ABSOLUTE COUNT: 2.9 10*9/L (ref 1.8–7.8)
NEUTROPHILS RELATIVE PERCENT: 62.3 %
PLATELET COUNT: 209 10*9/L (ref 150–450)
RED BLOOD CELL COUNT: 4.38 10*12/L (ref 4.26–5.60)
RED CELL DISTRIBUTION WIDTH: 16.5 % — ABNORMAL HIGH (ref 12.2–15.2)
WBC ADJUSTED: 4.6 10*9/L (ref 3.6–11.2)

## 2022-01-13 LAB — PROTEIN / CREATININE RATIO, URINE
CREATININE, URINE: 89.2 mg/dL
PROTEIN URINE: 29.3 mg/dL
PROTEIN/CREAT RATIO, URINE: 0.328

## 2022-01-13 LAB — MAGNESIUM: MAGNESIUM: 1.8 mg/dL (ref 1.6–2.6)

## 2022-01-13 LAB — PHOSPHORUS: PHOSPHORUS: 2.5 mg/dL (ref 2.4–5.1)

## 2022-01-13 MED ORDER — CARVEDILOL 6.25 MG TABLET
ORAL_TABLET | Freq: Two times a day (BID) | ORAL | 3 refills | 90 days | Status: CP
Start: 2022-01-13 — End: 2023-01-13

## 2022-01-13 NOTE — Unmapped (Signed)
Transplant Coordinator, Clinic Visit   Pt seen today by transplant nephrology for follow up, reviewed medications and symptoms.   Met with pt during clinic visit today. Pt doing ok. Pt will get labs on his way out of clinic. Pt reports that he had been taking Envarsus 4 mg daily for a while and spoke with pharmacy last week when he needed a refill and they told him he was supposed to be taking 5 mg, he increased to 5 mg Envarsus last Thursday.   Pt will be given new med list today  Wife concerned for 10 lb weight loss  Per Dr. Nestor Lewandowsky:  CT abdomen/pelvis  Decrease Metformin to 500 mg once per day  If Creatinine 1.9 or higher- pt needs biopsy    Assessment  BP: 172/61 today in clinic. Pt reports home BP's are 130's/60's  Lightheaded: denies  BG: 120's  Headache: denies  Hand tremors: denies  Numbness/tingling: denies  Fevers: denies  Chills/sweats: denies  Shortness of breath: denies  Chest pain or pressure: denies  Palpitations: denies  Abdominal pain: denies  Heart burn: denies  Nausea/vomiting: denies  Diarrhea/constipation: some diarrhea intermittently, he reports he has it when he eats something bad  UTI symptoms: denies  Swelling: denies    Good appetite; reports adequate hydration.     Intake: 3-4 bottles water per day    Any new medications? denies  Immunosuppressant last taken: Pt took his Envarsus today already    Functional Score: 100   Normal no complaints; no evidence of  disease.     I spent a total of 10 minutes with Frank Leach reviewing medications and symptoms.

## 2022-01-13 NOTE — Unmapped (Signed)
Transplant Nephrology Clinic Visit    Subjective/Interval:   Since patient's last visit in the transplant clinic - patient has been doing well in terms of transplant, taking transplant medications regularly, no episodes of rejection and no side effects of medications.    Today patient presents feeling overall well. Home SBP 140s-160s. BG is well controlled. He has lost weight 5 lbs in the pat 3-4 months. Has been having occasionally loose stools. No nausea/vomiting. Appetite is good.    Assessment:  71 y.o. 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       Recommendations/Plan:     Allograft Function: DDKT KDPI: 42%06/22/2019  Renal function holding relatively stable with electrolytes and acid base balanced.   Creatinine 2.21, still above his baseline - will schedule him for biopsy.    Baseline - 1.4-1.8  [place]/ BK     neg Date:  05/27/21  DSA     neg Date:  05/27/21  CMV:    <52 Date:  01/13/22, continue to monitor his CMV.  UPC:     0.328 Date:  01/13/22.       Immunosuppression Management [High Risk Medical Decision Making For Drug Therapy Requiring Intensive Monitoring For Toxicity]:   Takes Envarsus at 9:00 a.m and is on 5 mg and Myfortic 360 mg BID.  Tacro target: 4-6  Side effects: tremors.  Level is 20.9 not a trough- Continue same dose of Envarsus.    Blood Pressure Management: Home BPs have been better 130s. 172/61 in clinic today.  Increase olmesartan to 40 mg daily.    Lipid Management: Last lipid panel on 05/27/2021. The 10-year ASCVD risk score (Arnett DK, et al., 2019) is: 46.8%  Currently taking atorvastatin     Electrolytes: K 5.0 monitor.    RCC s/p bilateral nephrectomy:   CT RMP 08/29/2018 showed no suspicious enhancing lesions.   Repeat CT findings from 04/07/20 were overall stable.  Repeat CT scan soon.    D.M -well controlled.   Continue Jardiance + metformin.  Decrease metformin to 500 mg every day due to abdominal discomfort and weight loss.    Health Maintenance:   Colonoscopy: 04/30/2018 - 1 adenomatous polyp; repeat 5-10 years    Immunizations:   Flu Shot: Received fall 2022 per patient report.  Prevnar 13: 10/30/2013  Pneumovax: 04/24/2016, due 2022  No immunizations till 1 year post transplant.  COVID-19 (Moderna): 09/12/19, 10/14/19; booster received 04/07/20, 10/2020, 05/2021    History of Present Illness    71 y.o. male here for follow up after kidney transplantation.  Patient has ESRD secondary to HTN.  Patient has been on dialysis since 01/25/2012.  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 Patient was admitted for kidney transplant on 06/21/2019.      Transplant History:    Organ Received: Left DDKT, DBD, PHS, KDPI: 42%; 21h cold ischemia  Native Kidney Disease: HTN; cPRA: 0%  Date of Transplant: 06/22/2019  Post-Transplant Course: HD once for high potassium  Prior Transplants: none  Induction: [place]  Date of Ureteral Stent Removal: 07/29/2019  CMV/EBV Status: CMV D+/R+  EBV D+/R+  Rejection Episodes: none  Donor Specific Antibodies: none  Results of Renal Imaging (pre and post):   Pre-Txp 08/29/2018 CT RMP  Sequela of bilateral nephrectomy. Interval decrease in linear soft tissue within the right nephrectomy bed, potentially postsurgical or fat necrosis. Unchanged linear tissue within  the left nephrectomy bed. No suspicious enhancing lesions are visualized within the surgical beds    Post-Txp 06/25/2019 (txp kidney only)  The renal transplant was located in the left lower quadrant. Normal size and echogenicity.  No solid masses or calculi. Trace perinephric fluid adjacent to the lower pole of the kidney. Mild pelviectasis  - Perfusion: Using power Doppler, normal perfusion was seen throughout the renal parenchyma.  - Resistive indices in the renal transplant are stable compared with prior examination.  - Main renal artery/iliac artery: Patent. Again noted are 3 renal arteries. Resistive indices within the renal arteries are stable to minimally increased, now at or just above normal limits.  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    Medications  Current Outpatient Medications   Medication Sig Dispense Refill    acetaminophen (TYLENOL) 500 MG tablet Take 1-2 tablets (500-1,000 mg total) by mouth every six (6) hours as needed for pain or fever (> 38C). 100 tablet 0    allopurinoL (ZYLOPRIM) 100 MG tablet Take 1 tablet (100 mg total) by mouth every evening. 90 tablet 3    amLODIPine (NORVASC) 10 MG tablet Take 0.5 tablets (5 mg total) by mouth two (2) times a day. 30 tablet 11    atorvastatin (LIPITOR) 40 MG tablet Take 1 tablet (40 mg total) by mouth daily. 90 tablet 3    carvediloL (COREG) 6.25 MG tablet Take 2 tablets (12.5 mg total) by mouth Two (2) times a day. 360 tablet 3    cholecalciferol, vitamin D3-125 mcg, 5,000 unit,, 125 mcg (5,000 unit) tablet Take 1 tablet (125 mcg total) by mouth daily.      empagliflozin (JARDIANCE) 25 mg tablet Take 1 tablet (25 mg total) by mouth daily. 90 tablet 3    ENVARSUS XR 1 mg Tb24 extended release tablet Take 1 tablet (1 mg total) by mouth in the morning. HOLD- Take 4 mg daily. 90 tablet 11    ENVARSUS XR 1 mg Tb24 extended release tablet Take 1 tablet (1 mg) by mouth daily in addition to one 4 mg tablet  for a total daily dose of 5 mg 90 tablet 11    ENVARSUS XR 4 mg Tb24 extended release tablet Take 1 tablet (4 mg) by mouth daily in addition to one 1 mg tablet for a total daily dose of  5 mg 30 tablet 11    magnesium oxide-Mg AA chelate (MAGNESIUM, AMINO ACID CHELATE,) 133 mg Take 1 tablet by mouth two (2) times a day. 100 tablet 11    metFORMIN (GLUCOPHAGE-XR) 500 MG 24 hr tablet Take 2 tablets (1,000 mg total) by mouth daily. 180 tablet 3    mycophenolate (MYFORTIC) 180 MG EC tablet Take 2 tablets (360 mg total) by mouth Two (2) times a day. 120 tablet 5    olmesartan (BENICAR) 40 MG tablet Take 1 tablet (40 mg total) by mouth daily. 90 tablet 3    omeprazole (PRILOSEC) 40 MG capsule Take 1 capsule (40 mg total) by mouth every other day. 45 capsule 3     No current facility-administered medications for this visit.     Physical Exam:   BP 172/61 (BP Site: R Arm,  BP Position: Sitting, BP Cuff Size: Small)  - Pulse (!) 48  - Temp 36.1 ??C (97 ??F) (Temporal)  - Wt 68.1 kg (150 lb 3.2 oz)  - BMI 23.52 kg/m??     Nursing note and vitals reviewed.   Constitutional: Oriented to person, place, and time. Appears well-developed and well-nourished. No distress.   HENT: Wearing face mask.  Head: Normocephalic and atraumatic.   Eyes: Right eye exhibits no discharge. Left eye exhibits no discharge. No scleral icterus.   Neck: Normal range of motion. Neck supple.   Cardiovascular: Normal rate and regular rhythm. Exam reveals no gallop and no friction rub. No murmur heard.   Pulmonary/Chest: Effort normal and breath sounds normal. No respiratory distress.   Abdominal: Soft. Non tender  Musculoskeletal: Normal range of motion. No edema and no tenderness.   Neurological: Alert and oriented to person, place, and time.   Skin: Skin is warm and dry.   Psychiatric: Normal mood and affect.       Laboratory Data and Imaging reviewed in EPIC    Follow-Up: Return to clinic in 3 months.  Labs: once a month.    Leeroy Bock, MD

## 2022-01-14 LAB — TACROLIMUS LEVEL, TROUGH: TACROLIMUS, TROUGH: 20.9 ng/mL (ref 5.0–15.0)

## 2022-01-14 NOTE — Unmapped (Signed)
Received page regarding critical Tacrolimus trough of 20.9. Lab was drawn at 1154. I called patient who endorsed he had taken his 0900 dose that morning prior to lab draw.

## 2022-01-16 LAB — CMV DNA, QUANTITATIVE, PCR
CMV QUANT LOG10: 1.72 {Log_IU}/mL — ABNORMAL HIGH (ref <0.00)
CMV QUANT: 52 [IU]/mL — ABNORMAL HIGH (ref <0)
CMV VIRAL LD: DETECTED — AB

## 2022-01-16 MED ORDER — METFORMIN ER 500 MG TABLET,EXTENDED RELEASE 24 HR
ORAL_TABLET | Freq: Every day | ORAL | 3 refills | 90 days
Start: 2022-01-16 — End: ?

## 2022-01-17 NOTE — Unmapped (Signed)
[facility] Nephrology and Hypertension  Kidney Transplant Biopsy Note    Date of Biopsy Referral: 01/17/22    Referring Transplant Provider: Leeroy Bock    Phone: [phone]    Kidney Transplant Coordinator: Rennie Plowman    Biopsy Fellow  _____________________ at _______________________ otherwise contact referring provider.     (NAME)   (CONTACT - pager/cell phone #)    ---------------------------------------------------------------------------------------------------------------------  PLEASE INDICATED BIOPSY URGENCY:  EARLY MORNING READ      Patient Name: [name]  MR: [id number]  Age: 71 y.o.  Sex: Male Race: Black/African American Ethnicity: Not Hispanic, Latino/a, or Spanish origin   DOB:04-28-1951    Procedures: Ultrasound Guided Percutaneous Kidney Biopsy under Moderate Sedation  Tissue Submitted: Kidney  Special Studies Required: LM, IF, EM  ----------------------------------------------------------------------------------------------------------------------  Date of allograft implantation: 06/22/2019  ABO Incompatible: No  Underlying native kidney disease: HTN  Was the diagnosis established by biopsy? No  Previous transplant biopsies: No  If yes, what were the previous diagnoses?   Previous kidney transplants: No     Donor Information (if available)  Type of donor: Cadaveric  Ischemia time: 21 hours   Delayed graft function: No  If yes, how many days on dialysis:     History/Clinical Diagnosis/Indication for Biopsy: Rise in creatinine from his baseline of 1.4-1.8    ----------------------------------------------------------------------------------------------------------------------  Current Baseline Immunosuppression: tacrolimus (Prograf or Envarsus) and mycophenolate (Cellcept or Myfortic) (please select ALL drugs used)  Specific anti-rejection treatment WITHIN 2 WEEKS before biopsy: No  If yes, what was the type of treatment? None  Patient off immunosuppression?: No  Patient seems adherent to immunosuppression? Yes  Patient is currently back on dialysis? No  Has patient had any prior episodes of rejection? No   If yes, what was the treatment? None  ----------------------------------------------------------------------------------------------------------------------  Donor Specific Antibody Results:  Lab Results   Component Value Date    Donor ID [id number] 05/27/2021    Donor HLA-A Antigen #1 A2 05/27/2021    Anti-Donor HLA-A #1 MFI 84 05/27/2021    Donor HLA-A Antigen #2 A26 05/27/2021    Donor HLA-B Antigen #1 B38 05/27/2021    Anti-Donor HLA-B #1 MFI 19 05/27/2021    Donor HLA-B Antigen #2 B44 05/27/2021    Anti-Donor HLA-B #2 MFI 79 05/27/2021    Donor HLA-C Antigen #1 C5 05/27/2021    Anti-Donor HLA-C #1 MFI 219 05/27/2021    Donor HLA-C Antigen #2 C12 05/27/2021    Anti-Donor HLA-C #2 MFI 236 05/27/2021    Donor HLA-DR Antigen #1 DR4 05/27/2021    Anti-Donor HLA-DR #1 MFI 446 05/27/2021    Donor HLA-DR Antigen #2 DR7 05/27/2021    Anti-Donor HLA-DR #2 MFI 68 05/27/2021    Donor DRw Antigen #1 DR53 05/27/2021    Anti-Donor DRw #1 MFI 149 05/27/2021    Donor HLA-DQB Antigen #1 DQ2 05/27/2021    Anti-Donor HLA-DQB #1 MFI 627 05/27/2021    Donor HLA-DQB Antigen #2 DQ8 05/27/2021    Anti-Donor HLA-DQB #2 MFI 555 05/27/2021    DSA Comment  05/27/2021      Comment:      The HLA antigens listed are donor antigens and the corresponding MFI value.  MFI values >= 1000 are considered positive.  A negative result does not exclude the presence of low level DSA.  Blank donor antigen and MFI fields indicate unavailable donor HLA   type or lack of appropriate single antigen bead to determine DSA.  As such, the presence or absence  of DSA to the locus cannot be confirmed.     Donor specific antibody (DSA) testing is performed with a  solid phase multiplex single antigen bead array method.  All procedures and reagents have been validated and performance characteristics determined by the [facility]. Certain of these tests have not been cleared/approved by the U.S. Food and Drug Administration (FDA).  The FDA has determined that such approval/clearance is not necessary because this laboratory is certified under the Clinical Laboratory Improvements   Amendments to perform high complexity testing.  This test is used for clinical purposes.  It should not be regarded as investigational or for research.  All HLA typings are performed using molecular methodologies.  HLA-A, B, C, DR, and DQ results are   serological equivalents of the molecular type.             Blood Pressure (mmHg):   BP Readings from Last 3 Encounters:   01/13/22 172/61   09/28/21 180/58   05/27/21 142/60     On Anti-hypertensive Therapy: Yes    Urinalysis:  Lab Results   Component Value Date    Color, UA Yellow 01/13/2022    Color, UA Colorless 12/13/2011    Specific Gravity, UA 1.015 01/13/2022    Specific Gravity, UA 1.008 12/13/2011    pH, UA 5.5 01/13/2022    pH, UA 6.5 12/13/2011    Glucose, UA >1000 mg/dL (A) 96/29/5284    Glucose, UA NEGATIVE 12/13/2011    Ketones, UA Negative 01/13/2022    Ketones, UA NEGATIVE 12/13/2011    Blood, UA Negative 01/13/2022    Blood, UA +1 12/13/2011    Nitrite, UA Negative 01/13/2022    Nitrite, UA NEGATIVE 12/13/2011    Leukocyte Esterase, UA Negative 01/13/2022    Leukocyte Esterase, UA NEGATIVE 12/13/2011    Urobilinogen, UA 0.2 mg/dL 13/24/4010    Urobilinogen, UA 1 12/13/2011    Bilirubin, UA Negative 01/13/2022    Bilirubin, UA NEGATIVE 12/13/2011    WBC, UA <1 12/13/2011    RBC, UA <1 12/13/2011     Urine protein/creatinine ratio:   Lab Results   Component Value Date    Protein/Creatinine Ratio, Urine 0.328 01/13/2022    Protein/Creatinine Ratio, Urine 3.384 01/22/2012     Lab Results   Component Value Date    CREATININE 2.21 (H) 01/13/2022    CREATININE 2.15 (H) 01/05/2022    CREATININE 1.90 (H) 12/05/2021    CREATININE 1.98 (H) 11/07/2021    CREATININE 1.66 (H) 10/10/2021       Glucose:   Lab Results   Component Value Date    Glucose 101 01/13/2022     HGBA1C:   Lab Results   Component Value Date    Hemoglobin A1C 5.9 (H) 09/28/2021    Hemoglobin A1C 6.7 (H) 05/14/2013     ----------------------------------------------------------------------------------------------------------------------  Clinical signs of infections at time of current biopsy:  BK: No   BK blood viral load:   Lab Results   Component Value Date    BK Blood Result Not Detected 05/27/2021     Urine decoy cells present: No    CMV: Yes   CMV viral load:   Lab Results   Component Value Date    CMV Viral Ld Detected (A) 01/13/2022    CMV Quant 52 (H) 01/13/2022    CMV Quant Log10 1.72 (H) 01/13/2022          EBV:No   EBV viral load: No results found for: EBVIU, EBVLOG  HSV: No  Hepatitis B: No  Hepatitis C: No  Bacteria: No  Fungi: No  Urinary tract infection: No    ----------------------------------------------------------------------------------------------------------------------  Stenosis of renal artery: No  Obstruction of ureter: No  Lymphocele: No

## 2022-01-18 MED ORDER — METFORMIN ER 500 MG TABLET,EXTENDED RELEASE 24 HR
ORAL_TABLET | Freq: Every day | ORAL | 3 refills | 90 days
Start: 2022-01-18 — End: ?

## 2022-01-18 NOTE — Unmapped (Signed)
Patient called re: renal biopsy. Patient informed to arrive at 7am, have responsible party accompany to appointment, and remain NPO after midnight including all medications. Patient informed to bring all of their medications with them and they will be able to take once labs collected. COVID screening negative, procedure explained and all questions answered.

## 2022-01-19 ENCOUNTER — Encounter: Admit: 2022-01-19 | Discharge: 2022-01-20 | Payer: MEDICARE

## 2022-01-19 ENCOUNTER — Ambulatory Visit: Admit: 2022-01-19 | Discharge: 2022-01-20 | Payer: MEDICARE

## 2022-01-19 LAB — APTT
APTT: 27.9 s (ref 25.1–36.5)
HEPARIN CORRELATION: 0.2

## 2022-01-19 LAB — CBC W/ AUTO DIFF
BASOPHILS ABSOLUTE COUNT: 0.1 10*9/L (ref 0.0–0.1)
BASOPHILS RELATIVE PERCENT: 1.4 %
EOSINOPHILS ABSOLUTE COUNT: 0.2 10*9/L (ref 0.0–0.5)
EOSINOPHILS RELATIVE PERCENT: 4.3 %
HEMATOCRIT: 37.4 % — ABNORMAL LOW (ref 39.0–48.0)
HEMOGLOBIN: 12.2 g/dL — ABNORMAL LOW (ref 12.9–16.5)
LYMPHOCYTES ABSOLUTE COUNT: 0.7 10*9/L — ABNORMAL LOW (ref 1.1–3.6)
LYMPHOCYTES RELATIVE PERCENT: 16 %
MEAN CORPUSCULAR HEMOGLOBIN CONC: 32.7 g/dL (ref 32.0–36.0)
MEAN CORPUSCULAR HEMOGLOBIN: 29.6 pg (ref 25.9–32.4)
MEAN CORPUSCULAR VOLUME: 90.5 fL (ref 77.6–95.7)
MEAN PLATELET VOLUME: 8.1 fL (ref 6.8–10.7)
MONOCYTES ABSOLUTE COUNT: 0.7 10*9/L (ref 0.3–0.8)
MONOCYTES RELATIVE PERCENT: 17.4 %
NEUTROPHILS ABSOLUTE COUNT: 2.6 10*9/L (ref 1.8–7.8)
NEUTROPHILS RELATIVE PERCENT: 60.9 %
PLATELET COUNT: 189 10*9/L (ref 150–450)
RED BLOOD CELL COUNT: 4.13 10*12/L — ABNORMAL LOW (ref 4.26–5.60)
RED CELL DISTRIBUTION WIDTH: 16.2 % — ABNORMAL HIGH (ref 12.2–15.2)
WBC ADJUSTED: 4.2 10*9/L (ref 3.6–11.2)

## 2022-01-19 LAB — BASIC METABOLIC PANEL
ANION GAP: 5 mmol/L (ref 5–14)
BLOOD UREA NITROGEN: 36 mg/dL — ABNORMAL HIGH (ref 9–23)
BUN / CREAT RATIO: 16
CALCIUM: 10.2 mg/dL (ref 8.7–10.4)
CHLORIDE: 115 mmol/L — ABNORMAL HIGH (ref 98–107)
CO2: 23 mmol/L (ref 20.0–31.0)
CREATININE: 2.22 mg/dL — ABNORMAL HIGH
EGFR CKD-EPI (2021) MALE: 31 mL/min/{1.73_m2} — ABNORMAL LOW (ref >=60)
GLUCOSE RANDOM: 98 mg/dL (ref 70–99)
POTASSIUM: 4.8 mmol/L (ref 3.4–4.8)
SODIUM: 143 mmol/L (ref 135–145)

## 2022-01-19 LAB — CBC
HEMATOCRIT: 36.1 % — ABNORMAL LOW (ref 39.0–48.0)
HEMOGLOBIN: 11.8 g/dL — ABNORMAL LOW (ref 12.9–16.5)
MEAN CORPUSCULAR HEMOGLOBIN CONC: 32.5 g/dL (ref 32.0–36.0)
MEAN CORPUSCULAR HEMOGLOBIN: 29.2 pg (ref 25.9–32.4)
MEAN CORPUSCULAR VOLUME: 89.9 fL (ref 77.6–95.7)
MEAN PLATELET VOLUME: 8.7 fL (ref 6.8–10.7)
PLATELET COUNT: 185 10*9/L (ref 150–450)
RED BLOOD CELL COUNT: 4.02 10*12/L — ABNORMAL LOW (ref 4.26–5.60)
RED CELL DISTRIBUTION WIDTH: 15.9 % — ABNORMAL HIGH (ref 12.2–15.2)
WBC ADJUSTED: 4.1 10*9/L (ref 3.6–11.2)

## 2022-01-19 LAB — PROTIME-INR
INR: 1.06
PROTIME: 12 s (ref 9.8–12.8)

## 2022-01-19 MED ADMIN — lidocaine (XYLOCAINE) 10 mg/mL (1 %) injection: @ 14:00:00 | Stop: 2022-01-19

## 2022-01-19 MED ADMIN — midazolam (VERSED) injection: INTRAVENOUS | @ 14:00:00 | Stop: 2022-01-19

## 2022-01-19 MED ADMIN — fentaNYL (PF) (SUBLIMAZE) injection: INTRAVENOUS | @ 14:00:00 | Stop: 2022-01-19

## 2022-01-19 NOTE — Unmapped (Signed)
KIDNEY BIOPSY PROCEDURE NOTE    INDICATIONS:  Elevated creatinine in a renal transplant    CONSENT/TIME OUT:    Risks, benefits and alternatives including blood loss requiring transfusion, loss of kidney or kidney function, and death were discussed with patient.  Written informed consent was obtained prior to the procedure and is detailed in the medical record.  Prior to the start of the procedure, a time out was taken and the identity of the patient was confirmed via name, medical record number and date of birth.  The availability of the correct equipment was verified.    PROCEDURE:  Name: Transplant Kidney Biopsy  Description: Ultrasound-guided transplant kidney biopsy    Pre-Procedure blood specimens were sent for CBC, PT/aPTT, and type & screen.     The patient was given  1 mg of midazolam and 25 mcg of fentanyl during the procedure, and 4 mL lidocaine 1% were used for local anesthesia. Under ultrasound guidance, a 16cm 16G biopsy needle was inserted into the left-sided transplanted kidney for a total of 2 passes with 2 core specimens obtained for analysis.      COMPLICATIONS:   Complications: None; no hematoma or active bleeding visualized    The patient will be closely watched in the recovery area, kept flat for 4 hours while wearing an abdominal binder, and will have a repeat CBC and ultrasound in 3 hours to insure no hemodynamically significant bleeding post-biopsy.    SPECIMEN(S):  Core #1: 1.4 x 0.1 x 0.1 (IF 0.3, LM 0.9. EM 0.2)  Core #2: 1.0 x 0.1 x 0.1 (LM 1.0)

## 2022-01-19 NOTE — Unmapped (Signed)
Assessment/Plan:    Frank Leach is a 71 y.o. male who will undergo Ultrasound-guided transplant kidney biopsy    1. Indications and risks/benefits of procedure reviewed with patient.    2. Consent signed and present on patient's charge.   3. No cardiopulmonary or other medical contraindications present therefore will proceed with biopsy.       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 male who will undergo Ultrasound-guided transplant kidney biopsy with moderate sedation.    Allergies: No Known Allergies    Medications:   Current Outpatient Medications   Medication Sig Dispense Refill    acetaminophen (TYLENOL) 500 MG tablet Take 1-2 tablets (500-1,000 mg total) by mouth every six (6) hours as needed for pain or fever (> 38C). 100 tablet 0    allopurinoL (ZYLOPRIM) 100 MG tablet Take 1 tablet (100 mg total) by mouth every evening. 90 tablet 3    atorvastatin (LIPITOR) 40 MG tablet Take 1 tablet (40 mg total) by mouth daily. 90 tablet 3    carvediloL (COREG) 6.25 MG tablet Take 1 tablet (6.25 mg total) by mouth Two (2) times a day. 180 tablet 3    cholecalciferol, vitamin D3-125 mcg, 5,000 unit,, 125 mcg (5,000 unit) tablet Take 1 tablet (125 mcg total) by mouth daily.      empagliflozin (JARDIANCE) 25 mg tablet Take 1 tablet (25 mg total) by mouth daily. 90 tablet 3    ENVARSUS XR 1 mg Tb24 extended release tablet Take 1 tablet (1 mg) by mouth daily in addition to one 4 mg tablet  for a total daily dose of 5 mg 90 tablet 11    ENVARSUS XR 4 mg Tb24 extended release tablet Take 1 tablet (4 mg) by mouth daily in addition to one 1 mg tablet for a total daily dose of  5 mg 30 tablet 11    magnesium oxide-Mg AA chelate (MAGNESIUM, AMINO ACID CHELATE,) 133 mg Take 1 tablet by mouth two (2) times a day. 100 tablet 11    metFORMIN (GLUCOPHAGE-XR) 500 MG 24 hr tablet Take 1 tablet (500 mg total) by mouth in the morning. 180 tablet 3    mycophenolate (MYFORTIC) 180 MG EC tablet Take 2 tablets (360 mg total) by mouth Two (2) times a day. 120 tablet 5    olmesartan (BENICAR) 40 MG tablet Take 1 tablet (40 mg total) by mouth daily. 90 tablet 3    omeprazole (PRILOSEC) 40 MG capsule Take 1 capsule (40 mg total) by mouth every other day. 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  Cardiovascular: Denies chest pain.   Pulmonary: Denies shortness of breath, snoring, sleep apnea, or respiratory infection.    Allergies:   No Known Allergies        PE:    Vitals:    01/19/22 0724   BP: 159/61   Pulse:    Resp:    Temp:    SpO2:      General:  well-appearing male in NAD.  Airway assessment: Class 2 - Can visualize soft palate and fauces, tip of uvula is obscured  Cardiovascular:  Bradycardic, regular  Lungs: respirations nonlabored

## 2022-01-25 DIAGNOSIS — Z940 Kidney transplant status: Principal | ICD-10-CM

## 2022-01-25 DIAGNOSIS — Z79899 Other long term (current) drug therapy: Principal | ICD-10-CM

## 2022-01-25 MED ORDER — SULFAMETHOXAZOLE 400 MG-TRIMETHOPRIM 80 MG TABLET
ORAL_TABLET | ORAL | 5 refills | 28 days | Status: CP
Start: 2022-01-25 — End: 2022-07-24
  Filled 2022-01-26: qty 12, 28d supply, fill #0

## 2022-01-25 MED ORDER — MYCOPHENOLATE SODIUM 180 MG TABLET,DELAYED RELEASE
ORAL_TABLET | Freq: Two times a day (BID) | ORAL | 3 refills | 30 days | Status: CP
Start: 2022-01-25 — End: ?
  Filled 2022-01-31: qty 180, 30d supply, fill #0

## 2022-01-25 MED ORDER — PREDNISONE 10 MG TABLET
ORAL_TABLET | ORAL | 0 refills | 8 days | Status: CP
Start: 2022-01-25 — End: 2022-02-02
  Filled 2022-01-26: qty 30, 30d supply, fill #0
  Filled 2022-01-26: qty 20, 8d supply, fill #0

## 2022-01-25 MED ORDER — PREDNISONE 5 MG TABLET
ORAL_TABLET | Freq: Every day | ORAL | 0 refills | 30 days | Status: CP
Start: 2022-01-25 — End: ?

## 2022-01-25 MED ORDER — VALGANCICLOVIR 450 MG TABLET
ORAL_TABLET | ORAL | 2 refills | 60 days | Status: CP
Start: 2022-01-25 — End: 2022-04-25
  Filled 2022-01-31: qty 15, 30d supply, fill #0

## 2022-01-25 NOTE — Unmapped (Signed)
[facility] Refill Coordination Note    Specialty Medication(s) to be Shipped:   Transplant: mycophenolate mofetil 180mg     Other medication(s) to be shipped:  atorvastatin , Jardiance and mag-ox     Frank Leach, DOB: 05-Jan-1951  Phone: [phone] (home)       All above HIPAA information was verified with patient.     Was a Nurse, learning disability used for this call? No    Completed refill call assessment today to schedule patient's medication shipment from the [facility] [phone]).  All relevant notes have been reviewed.     Specialty medication(s) and dose(s) confirmed: Regimen is correct and unchanged.   Changes to medications: [name] reports no changes at this time.  Changes to insurance: No  New side effects reported not previously addressed with a pharmacist or physician: None reported  Questions for the pharmacist: No    Confirmed patient received a Conservation officer, historic buildings and a Surveyor, mining with first shipment. The patient will receive a drug information handout for each medication shipped and additional FDA Medication Guides as required.       DISEASE/MEDICATION-SPECIFIC INFORMATION        N/A    SPECIALTY MEDICATION ADHERENCE     Medication Adherence    Patient reported X missed doses in the last month: 0  Specialty Medication: mycophenolate 180 mg  Patient is on additional specialty medications: No              Were doses missed due to medication being on hold? No    mycophenolate 180 mg: 10 days of medicine on hand       REFERRAL TO PHARMACIST     Referral to the pharmacist: Not needed      [facility]     Shipping address confirmed in Epic.     Delivery Scheduled: Yes, Expected medication delivery date: 02/01/22.     Medication will be delivered via UPS to the prescription address in Epic WAM.    [name]   [facility] Specialty Technician

## 2022-01-25 NOTE — Unmapped (Addendum)
[facility] Pharmacist has reviewed this new prescription.  Patient was counseled on this dosage change by CC- see epic note from 01/25/22.  Next refill call date adjusted if necessary.        Clinical Assessment Needed For: Dose Change  Medication: Valganciclovir 450mg  tablet  Last Fill Date/Day Supply: 01/11/2022 / 30 days  Copay $47.23  Was previous dose already scheduled to fill: No    Notes to Pharmacist: Prednisone taper sent. 10mg  - $3.87, then taking 5mg  - $10.48. Bactrim - $1.40.

## 2022-01-25 NOTE — Unmapped (Signed)
Clinical Assessment Needed For: Dose Change  Medication: Mycophenolate 180mg  EC tablet  Last Fill Date/Day Supply: 01/05/2022 / 30 days  Copay $83.20  Was previous dose already scheduled to fill: Yes    Notes to Pharmacist: Scheduled to fill 06/27. This is a price increase from previous

## 2022-01-25 NOTE — Unmapped (Signed)
Reviewed biopsy results ordered by Dr. Nestor Lewandowsky due to rising creatinine. Shows mild inflammation with focal mild tubulitis. With rising creatinine of unclear cause, I think it is reasonable to treat with a modified protocol as below:    Plan:    Solumedrol 125 mg iv X 3 doses outpatient followed by rapid steroid taper  Check BG twice daily and report any high readings  Keep myfortic at 360 bid ( low level CMV noted on labs)  Bactrim for PJP prophylaxis for 1 month  CMV D+/R+  Moderate risk: vaclyte 450 daily for 1 month  Follow up with [place] July 5 or 6  DSA, BK, CMV next week when he gets CT  CMV weekly for the next 3 months    Plan discussed with patient who verbalized understanding and agreement.

## 2022-01-25 NOTE — Unmapped (Signed)
Reviewed recent kidney biopsy results with Dr. Christy Sartorius who called pt to discuss results.  Pt advised he will need 3 doses pulse steroid solumedrol 125 mg to be given in [facility] on 6/26, 6/27 and 6/28 at 8am.   Pt advises to start bactrim MWF and valcyte 450 mg daily for 30 days and starting next Thursday 6/29, he will begin prednisone taper.  Pt agreed to see Dr. Christy Sartorius in clinic on 7/5.  Pt verbalized understanding of all new medications and plan for steroids, he will also get labs on Monday for HLA, BK, CMV.  New scripts sent to [facility] for shipment asap.  Pt will start checking BG next week with initiation of steroids and keep a log.  Will check in next week to see how he is doing.

## 2022-01-26 DIAGNOSIS — Z940 Kidney transplant status: Principal | ICD-10-CM

## 2022-01-26 NOTE — Unmapped (Signed)
Pt unable to get valcyte due to increased cost to patient, reviewed with Dr. Christy Sartorius and Knox Saliva, PharmD.  Advised to pt that we will continue to monitor CMV levels and will get a CMV T-cell immunity panel with labs on Monday.  Pt reports other meds will be shipped today.

## 2022-01-30 ENCOUNTER — Encounter: Admit: 2022-01-30 | Discharge: 2022-01-30 | Payer: MEDICARE | Attending: Nephrology | Primary: Nephrology

## 2022-01-30 ENCOUNTER — Ambulatory Visit: Admit: 2022-01-30 | Discharge: 2022-01-30 | Payer: MEDICARE

## 2022-01-30 LAB — CBC W/ AUTO DIFF
BASOPHILS ABSOLUTE COUNT: 0.1 10*9/L (ref 0.0–0.1)
BASOPHILS RELATIVE PERCENT: 1.2 %
EOSINOPHILS ABSOLUTE COUNT: 0.2 10*9/L (ref 0.0–0.5)
EOSINOPHILS RELATIVE PERCENT: 4.1 %
HEMATOCRIT: 38 % — ABNORMAL LOW (ref 39.0–48.0)
HEMOGLOBIN: 12.4 g/dL — ABNORMAL LOW (ref 12.9–16.5)
LYMPHOCYTES ABSOLUTE COUNT: 0.8 10*9/L — ABNORMAL LOW (ref 1.1–3.6)
LYMPHOCYTES RELATIVE PERCENT: 18.2 %
MEAN CORPUSCULAR HEMOGLOBIN CONC: 32.5 g/dL (ref 32.0–36.0)
MEAN CORPUSCULAR HEMOGLOBIN: 29.8 pg (ref 25.9–32.4)
MEAN CORPUSCULAR VOLUME: 91.7 fL (ref 77.6–95.7)
MEAN PLATELET VOLUME: 8.6 fL (ref 6.8–10.7)
MONOCYTES ABSOLUTE COUNT: 0.7 10*9/L (ref 0.3–0.8)
MONOCYTES RELATIVE PERCENT: 15.3 %
NEUTROPHILS ABSOLUTE COUNT: 2.8 10*9/L (ref 1.8–7.8)
NEUTROPHILS RELATIVE PERCENT: 61.2 %
PLATELET COUNT: 205 10*9/L (ref 150–450)
RED BLOOD CELL COUNT: 4.15 10*12/L — ABNORMAL LOW (ref 4.26–5.60)
RED CELL DISTRIBUTION WIDTH: 16 % — ABNORMAL HIGH (ref 12.2–15.2)
WBC ADJUSTED: 4.6 10*9/L (ref 3.6–11.2)

## 2022-01-30 LAB — RENAL FUNCTION PANEL
ALBUMIN: 4.4 g/dL (ref 3.4–5.0)
ANION GAP: 9 mmol/L (ref 5–14)
BLOOD UREA NITROGEN: 22 mg/dL (ref 9–23)
BUN / CREAT RATIO: 12
CALCIUM: 10.3 mg/dL (ref 8.7–10.4)
CHLORIDE: 112 mmol/L — ABNORMAL HIGH (ref 98–107)
CO2: 22 mmol/L (ref 20.0–31.0)
CREATININE: 1.88 mg/dL — ABNORMAL HIGH
EGFR CKD-EPI (2021) MALE: 38 mL/min/{1.73_m2} — ABNORMAL LOW (ref >=60)
GLUCOSE RANDOM: 93 mg/dL (ref 70–99)
PHOSPHORUS: 2.9 mg/dL (ref 2.4–5.1)
POTASSIUM: 4.6 mmol/L (ref 3.4–4.8)
SODIUM: 143 mmol/L (ref 135–145)

## 2022-01-30 LAB — TACROLIMUS LEVEL, TROUGH: TACROLIMUS, TROUGH: 6.8 ng/mL (ref 5.0–15.0)

## 2022-01-30 LAB — CMV DNA, QUANTITATIVE, PCR
CMV QUANT LOG10: 2.3 {Log_IU}/mL — ABNORMAL HIGH (ref <0.00)
CMV QUANT: 201 [IU]/mL — ABNORMAL HIGH (ref <0)
CMV VIRAL LD: DETECTED — AB

## 2022-01-30 LAB — MAGNESIUM: MAGNESIUM: 1.4 mg/dL — ABNORMAL LOW (ref 1.6–2.6)

## 2022-01-30 MED ADMIN — methylPREDNISolone sodium succinate (PF) (SOLU-medrol) injection 125 mg: 125 mg | INTRAVENOUS | @ 13:00:00 | Stop: 2022-01-30

## 2022-01-30 NOTE — Unmapped (Signed)
2956 Patient arrived to the Transplant Infusion Room today for Solumedrol 125mg  IV, Condition: well; Mobility: ambulating; accompanied by spouse.   See Flowsheet and MAR for all details of visit.   2130 VS stable.  8657 PIV placed and secured with coban; labs no orders found, urine no orders found.  8469 Solumedrol 125mg  IV given . Patient educated to notify nursing staff if they experience urticaria, erythema, nausea, shortness of breath, nausea, metal taste in mouth, excessive oral or postnasal drainage and any other changes in status which could be side effects from initiating this medication.  0843 VS stable, PIV removed and secured with coban, patient left clinic today, Condition: well; Mobility: ambulating; accompanied by spouse.

## 2022-01-31 ENCOUNTER — Ambulatory Visit: Admit: 2022-01-31 | Discharge: 2022-02-01 | Payer: MEDICARE

## 2022-01-31 DIAGNOSIS — Z940 Kidney transplant status: Principal | ICD-10-CM

## 2022-01-31 LAB — BK VIRUS QUANTITATIVE PCR, BLOOD: BK BLOOD RESULT: NOT DETECTED

## 2022-01-31 MED ADMIN — methylPREDNISolone sodium succinate (PF) (SOLU-medrol) injection 125 mg: 125 mg | INTRAVENOUS | @ 13:00:00 | Stop: 2022-01-31

## 2022-01-31 MED FILL — ATORVASTATIN 40 MG TABLET: ORAL | 90 days supply | Qty: 90 | Fill #2

## 2022-01-31 MED FILL — MG-PLUS-PROTEIN 133 MG TABLET: ORAL | 50 days supply | Qty: 100 | Fill #5

## 2022-01-31 MED FILL — JARDIANCE 25 MG TABLET: ORAL | 30 days supply | Qty: 30 | Fill #3

## 2022-01-31 NOTE — Unmapped (Signed)
0800 Patient arrived to the Transplant Infusion Room today for Solumedrol 125mg  IV, Condition: well; Mobility: ambulating; accompanied by spouse.   See Flowsheet and MAR for all details of visit.   0803 VS stable.  2956 PIV placed and secured with coban; labs no orders found, urine no orders found.  2130 Solumedrol 125mg  IV given.   8657 PIV removed and secured with coban, patient left clinic today, Condition: well; Mobility: ambulating; accompanied by spouse.

## 2022-01-31 NOTE — Unmapped (Signed)
Orders only

## 2022-01-31 NOTE — Unmapped (Signed)
This onboarding is for the following medication:  1) Physicist, medical [facility]   Patient Onboarding/Medication Counseling    Frank Leach is a 71 y.o. male with a kidney transplant who I am counseling today on continuation of therapy.  I am speaking to the patient.    Was a Nurse, learning disability used for this call? No    Verified patient's date of birth / HIPAA.    Specialty medication(s) to be sent: Transplant: valgancyclovir 450mg       Non-specialty medications/supplies to be sent: none      Medications not needed at this time: none         Valcyte (valganciclovir)    Medication & Administration     Dosage:   Take one tablet every other day    Administration:   Take with food  Swallow the pills whole, do not break, crush, or chew    Adherence/Missed dose instructions:  Take a missed dose as soon as you think about it with food  If it is close to your next dose, skip the missed dose and go back to your normal time.  Do not take 2 doses at the same time or extra doses.  Report any missed doses to coordinator    Goals of Therapy     To prevent or treat CMV infection in setting of solid organ transplant    Side Effects & Monitoring Parameters   Common side effects  Headache  Diarrhea or constipation  Appetite or sleep disturbances  Back, muscle, joint, or belly pain  Weight loss  Dizziness  Muscle spasm  Upset stomach or vomiting    The following side effects should be reported to the provider:  Allergic reaction  (rash, hives, swelling, blistered or peeling skin, shortness of breath)  Infection (fever, chills, sore throat, ear/sinus pain, cough, sputum change, urinary pain, mouth sores, non-healing wounds)  Bleeding (cough ground vomit, blood in urine, black/red/tarry stools, unexplained bruising or bleeding)  Electrolyte problems (mood changes, confusion, weakness, abnormal heartbeat, seizures)  Kidney problems (urine changes, weight gain)  Yellowing skin or eyes  Swelling in arms, legs, stomach  Severe dizziness or passing out  Eye issues (eyesight changes, pain, or irritation)  Night sweats    Monitoring parameters  Have eye exam as directed by doctor  CMV counts  CBC  Renal function  Pregnancy test prior to initiation    Contraindications, Warnings, & Precautions   BBW: severe leukopenia, neutropenia, anemia, thrombocytopenia, pancytopenia, and bone marrow failure, including aplastic anemia have been reported  BBW: may cause temporary or permanent inhibition of spermatogenesis and suppression of fertilty; has the potential to cause birth defects and cancers in humans  Male patients should have pregnancy test prior to initiation and use birth control for at least 30 days after discontinuation  Male patients should use a barrier contraceptive while on therapy and for 90 days after discontinuation  Acute renal failure  Not indicated for use in liver transplant recipients  Breastfeeding is not recommended    Drug/Food Interactions   Medication list reviewed in Epic. The patient was instructed to inform the care team before taking any new medications or supplements. No drug interactions identified.   Check with your doctor before getting any vaccinations (live or inactivated)    Storage, Handling Precautions, & Disposal   Store at room temperature  Keep away from children and pets      Current Medications (including OTC/herbals), Comorbidities and  Allergies     Current Outpatient Medications   Medication Sig Dispense Refill    acetaminophen (TYLENOL) 500 MG tablet Take 1-2 tablets (500-1,000 mg total) by mouth every six (6) hours as needed for pain or fever (> 38C). 100 tablet 0    allopurinoL (ZYLOPRIM) 100 MG tablet Take 1 tablet (100 mg total) by mouth every evening. 90 tablet 3    atorvastatin (LIPITOR) 40 MG tablet Take 1 tablet (40 mg total) by mouth daily. 90 tablet 3    carvediloL (COREG) 6.25 MG tablet Take 1 tablet (6.25 mg total) by mouth Two (2) times a day. 180 tablet 3    cholecalciferol, vitamin D3-125 mcg, 5,000 unit,, 125 mcg (5,000 unit) tablet Take 1 tablet (125 mcg total) by mouth daily.      empagliflozin (JARDIANCE) 25 mg tablet Take 1 tablet (25 mg total) by mouth daily. 90 tablet 3    ENVARSUS XR 1 mg Tb24 extended release tablet Take 1 tablet (1 mg) by mouth daily in addition to one 4 mg tablet  for a total daily dose of 5 mg 90 tablet 11    ENVARSUS XR 4 mg Tb24 extended release tablet Take 1 tablet (4 mg) by mouth daily in addition to one 1 mg tablet for a total daily dose of  5 mg 30 tablet 11    magnesium oxide-Mg AA chelate (MAGNESIUM, AMINO ACID CHELATE,) 133 mg Take 1 tablet by mouth two (2) times a day. 100 tablet 11    metFORMIN (GLUCOPHAGE-XR) 500 MG 24 hr tablet Take 1 tablet (500 mg total) by mouth in the morning. 180 tablet 3    mycophenolate (MYFORTIC) 180 MG EC tablet Take 3 tablets (540 mg total) by mouth Two (2) times a day. 180 tablet 3    olmesartan (BENICAR) 40 MG tablet Take 1 tablet (40 mg total) by mouth daily. 90 tablet 3    omeprazole (PRILOSEC) 40 MG capsule Take 1 capsule (40 mg total) by mouth every other day. (Patient not taking: Reported on 01/13/2022) 45 capsule 3    predniSONE (DELTASONE) 10 MG tablet Starting Thursday 6/29, take 4 tablets ( 40 mg total) for 2 days, then take 3 tablets ( 30 mg total) for 2 days, then take 2 tablets ( 20 mg total ) for 2 days, then take 1 tablet ( 10 mg total) for 2 days.  Then begin taking 5 mg daily until instructed by provider to stop 20 tablet 0    predniSONE (DELTASONE) 5 MG tablet Take 1 tablet (5 mg total) by mouth daily. Start taking when you have completed the 10 mg tablets.  Take 1 tablet ( 5 mg total) daily. 30 tablet 0    sulfamethoxazole-trimethoprim (BACTRIM) 400-80 mg per tablet Take 1 tablet (80 mg of trimethoprim total) by mouth Every Monday, Wednesday, and Friday. 12 tablet 5    valGANciclovir (VALCYTE) 450 mg tablet Take 1 tablet (450 mg total) by mouth every other day. 30 tablet 2     Current Facility-Administered Medications   Medication Dose Route Frequency Provider Last Rate Last Admin    [START ON 02/01/2022] methylPREDNISolone sodium succinate (PF) (SOLU-medrol) injection 125 mg  125 mg Intravenous Once Quintella Baton, MD           No Known Allergies    Patient Active Problem List   Diagnosis    ESRD (end stage renal disease) (CMS-HCC)    Hyperlipidemia    H/O splenectomy    Diabetes mellitus (  CMS-HCC)    HTN (hypertension)    Bilateral renal masses    History of unilateral nephrectomy    Delirium, acute    Clear cell adenocarcinoma of left kidney (CMS-HCC)    Kidney replaced by transplant    Immunosuppression (CMS-HCC)       Reviewed and up to date in Epic.    Appropriateness of Therapy     Acute infections noted within Epic:  No active infections  Patient reported infection: None    Is medication and dose appropriate based on diagnosis and infection status? Yes    Prescription has been clinically reviewed: Yes      Baseline Quality of Life Assessment      How many days over the past month did your kidney transplant  keep you from your normal activities? For example, brushing your teeth or getting up in the morning. 0    Financial Information     Medication Assistance provided: None Required    Anticipated copay of $47.23 reviewed with patient. Verified delivery address.    Delivery Information     Scheduled delivery date: 02/01/22    Expected start date: 02/01/22    Medication will be delivered via UPS to the prescription address in [facility].  This shipment will not require a signature.      Explained the services we provide at [facility] and that each month we would call to set up refills.  Stressed importance of returning phone calls so that we could ensure they receive their medications in time each month.  Informed patient that we should be setting up refills 7-10 days prior to when they will run out of medication.  A pharmacist will reach out to perform a clinical assessment periodically.  Informed patient that a welcome packet, containing information about our pharmacy and other support services, a Notice of Privacy Practices, and a drug information handout will be sent.      The patient or caregiver noted above participated in the development of this care plan and knows that they can request review of or adjustments to the care plan at any time.      Patient or caregiver verbalized understanding of the above information as well as how to contact the pharmacy at [phone] option 4 with any questions/concerns.  The pharmacy is open Monday through Friday 8:30am-4:30pm.  A pharmacist is available 24/7 via pager to answer any clinical questions they may have.    Patient Specific Needs     Does the patient have any physical, cognitive, or cultural barriers? No    Does the patient have adequate living arrangements? (i.e. the ability to store and take their medication appropriately) Yes    Did you identify any home environmental safety or security hazards? No    Patient prefers to have medications discussed with  Patient     Is the patient or caregiver able to read and understand education materials at a high school level or above? Yes    Patient's primary language is  English     Is the patient high risk? Yes, patient is taking a REMS drug. Medication is dispensed in compliance with REMS program    SOCIAL DETERMINANTS OF HEALTH     At the [facility], we have learned that life circumstances - like trouble affording food, housing, utilities, or transportation can affect the health of many of our patients.   That is why we wanted to ask: are you currently experiencing  any life circumstances that are negatively impacting your health and/or quality of life? Patient declined to answer    Social Determinants of Health     Financial Resource Strain: Not on file   Internet Connectivity: Not on file   Food Insecurity: Not on file   Tobacco Use: Low Risk     Smoking Tobacco Use: Never Smokeless Tobacco Use: Never    Passive Exposure: Not on file   Housing/Utilities: Unknown    Within the past 12 months, have you ever stayed: outside, in a car, in a tent, in an overnight shelter, or temporarily in someone else's home (i.e. couch-surfing)?: No    Are you worried about losing your housing?: Not on file    Within the past 12 months, have you been unable to get utilities (heat, electricity) when it was really needed?: Not on file   Alcohol Use: Not on file   Transportation Needs: Not on file   Substance Use: Not on file   Health Literacy: Low Risk     : Never   Physical Activity: Not on file   Interpersonal Safety: Not on file   Stress: Not on file   Intimate Partner Violence: Not on file   Depression: Not on file   Social Connections: Not on file       Would you be willing to receive help with any of the needs that you have identified today? Not applicable       Tera Helper  [facility] Specialty Pharmacist

## 2022-02-01 ENCOUNTER — Ambulatory Visit: Admit: 2022-02-01 | Discharge: 2022-02-02 | Payer: MEDICARE

## 2022-02-01 MED ADMIN — methylPREDNISolone sodium succinate (PF) (SOLU-medrol) injection 125 mg: 125 mg | INTRAVENOUS | @ 12:00:00 | Stop: 2022-02-01

## 2022-02-01 NOTE — Unmapped (Signed)
1610 Patient arrived to the Transplant Infusion Room today for Solumedrol 125mg  IV, Condition: well; Mobility: ambulating; accompanied by spouse.   See Flowsheet and MAR for all details of visit.   0803 VS stable.  0758 PIV placed and secured with coban; labs no orders found, urine no orders found.  0801 Solumedrol 125mg  IV given.   0806 VSS. PIV removed and secured with coban, patient left clinic today, Condition: well; Mobility: ambulating; accompanied by spouse.

## 2022-02-06 ENCOUNTER — Ambulatory Visit: Admit: 2022-02-06 | Discharge: 2022-02-07 | Payer: MEDICARE

## 2022-02-06 DIAGNOSIS — Z940 Kidney transplant status: Principal | ICD-10-CM

## 2022-02-06 DIAGNOSIS — Z79899 Other long term (current) drug therapy: Principal | ICD-10-CM

## 2022-02-06 LAB — CBC W/ AUTO DIFF
BASOPHILS ABSOLUTE COUNT: 0 10*9/L (ref 0.0–0.1)
BASOPHILS RELATIVE PERCENT: 0.4 %
EOSINOPHILS ABSOLUTE COUNT: 0.1 10*9/L (ref 0.0–0.5)
EOSINOPHILS RELATIVE PERCENT: 0.6 %
HEMATOCRIT: 38.2 % — ABNORMAL LOW (ref 39.0–48.0)
HEMOGLOBIN: 12.3 g/dL — ABNORMAL LOW (ref 12.9–16.5)
LYMPHOCYTES ABSOLUTE COUNT: 1.2 10*9/L (ref 1.1–3.6)
LYMPHOCYTES RELATIVE PERCENT: 12.6 %
MEAN CORPUSCULAR HEMOGLOBIN CONC: 32.1 g/dL (ref 32.0–36.0)
MEAN CORPUSCULAR HEMOGLOBIN: 29.4 pg (ref 25.9–32.4)
MEAN CORPUSCULAR VOLUME: 91.5 fL (ref 77.6–95.7)
MEAN PLATELET VOLUME: 7.9 fL (ref 6.8–10.7)
MONOCYTES ABSOLUTE COUNT: 1.1 10*9/L — ABNORMAL HIGH (ref 0.3–0.8)
MONOCYTES RELATIVE PERCENT: 10.9 %
NEUTROPHILS ABSOLUTE COUNT: 7.4 10*9/L (ref 1.8–7.8)
NEUTROPHILS RELATIVE PERCENT: 75.5 %
NUCLEATED RED BLOOD CELLS: 0 /100{WBCs} (ref <=4)
PLATELET COUNT: 222 10*9/L (ref 150–450)
RED BLOOD CELL COUNT: 4.18 10*12/L — ABNORMAL LOW (ref 4.26–5.60)
RED CELL DISTRIBUTION WIDTH: 15.8 % — ABNORMAL HIGH (ref 12.2–15.2)
WBC ADJUSTED: 9.8 10*9/L (ref 3.6–11.2)

## 2022-02-06 LAB — RENAL FUNCTION PANEL
ALBUMIN: 4 g/dL (ref 3.4–5.0)
ANION GAP: 8 mmol/L (ref 5–14)
BLOOD UREA NITROGEN: 41 mg/dL — ABNORMAL HIGH (ref 9–23)
BUN / CREAT RATIO: 21
CALCIUM: 11.1 mg/dL — ABNORMAL HIGH (ref 8.7–10.4)
CHLORIDE: 110 mmol/L — ABNORMAL HIGH (ref 98–107)
CO2: 23.6 mmol/L (ref 20.0–31.0)
CREATININE: 2 mg/dL — ABNORMAL HIGH
EGFR CKD-EPI (2021) MALE: 35 mL/min/{1.73_m2} — ABNORMAL LOW (ref >=60)
GLUCOSE RANDOM: 101 mg/dL (ref 70–179)
PHOSPHORUS: 3.3 mg/dL (ref 2.4–5.1)
POTASSIUM: 4.3 mmol/L (ref 3.4–4.8)
SODIUM: 142 mmol/L (ref 135–145)

## 2022-02-06 LAB — CMV T-CELL IMMUNITY PANEL

## 2022-02-06 LAB — CMV DNA, QUANTITATIVE, PCR
CMV QUANT: <50 [IU]/mL — ABNORMAL HIGH (ref <0)
CMV VIRAL LD: DETECTED — AB

## 2022-02-06 LAB — MAGNESIUM: MAGNESIUM: 1.5 mg/dL — ABNORMAL LOW (ref 1.6–2.6)

## 2022-02-06 LAB — TACROLIMUS LEVEL, TROUGH: TACROLIMUS, TROUGH: 3.1 ng/mL — ABNORMAL LOW (ref 5.0–15.0)

## 2022-02-06 MED ORDER — ENVARSUS XR 4 MG TABLET,EXTENDED RELEASE
ORAL_TABLET | Freq: Every day | ORAL | 11 refills | 30 days | Status: CP
Start: 2022-02-06 — End: ?

## 2022-02-06 MED ORDER — ENVARSUS XR 1 MG TABLET,EXTENDED RELEASE
ORAL_TABLET | 11 refills | 0 days | Status: CP
Start: 2022-02-06 — End: ?

## 2022-02-06 NOTE — Unmapped (Signed)
Reviewed recent labs and tac level with Dr. Christy Sartorius, pt advised to increase envarsus to 6 mg daily, pt verbalized understanding.  Will repeat labs next week.

## 2022-02-06 NOTE — Unmapped (Signed)
[facility] PHARMACY NOTE  02/08/2022   Frank Leach  [id number]    Medication changes today:   1. Stop Vit D  2. Bactrim and Valcyte to be completed 7/26  3. Plan to taper/stop prednisone in 2 weeks    Education/Adherence tools provided today:  1.provided updated medication list  2. provided additional education on immunosuppression and transplant related medications including reviewing indications of medications, dosing and side effects    Follow up items:  1. goal of understanding indications and dosing of immunosuppression medications   2. CMV VL  3. BP  4. BG  5. Ca2+    Next visit with pharmacy in PRN  ____________________________________________________________________    Frank Leach is a 71 y.o. 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     Other PMH significant for diabetes (diet controlled pre transplant), gout, HTN    Seen by pharmacy today for: medication management and pill box fill and adherence education; last seen by pharmacy  16 months      01/2022:  Kidney biopsy: mild inflammation with focal mild tubulitis.   Solumedrol 125 mg iv X 3 doses outpatient followed by rapid steroid taper  Keep myfortic at 360 bid ( low level CMV noted on labs)  Bactrim for PJP prophylaxis for 1 month  CMV D+/R+  Moderate risk: vaclyte 450 daily for 1 month    CC: Patient has no complaints today.     Vitals:    02/08/22 1259   BP: 155/57   Pulse: 57   Temp: 36.6 ??C (97.8 ??F)       No Known Allergies    All medications reviewed and updated.     Medication list includes revisions made during today???s encounter    Outpatient Encounter Medications as of 02/08/2022   Medication Sig Dispense Refill    acetaminophen (TYLENOL) 500 MG tablet Take 1-2 tablets (500-1,000 mg total) by mouth every six (6) hours as needed for pain or fever (> 38C). 100 tablet 0    allopurinoL (ZYLOPRIM) 100 MG tablet Take 1 tablet (100 mg total) by mouth every evening. 90 tablet 3 atorvastatin (LIPITOR) 40 MG tablet Take 1 tablet (40 mg total) by mouth daily. 90 tablet 3    cholecalciferol, vitamin D3-125 mcg, 5,000 unit,, 125 mcg (5,000 unit) tablet Take 1 tablet (125 mcg total) by mouth daily.      empagliflozin (JARDIANCE) 25 mg tablet Take 1 tablet (25 mg total) by mouth daily. 90 tablet 3    ENVARSUS XR 1 mg Tb24 extended release tablet Take 2 tablets (2 mg total) by mouth in the morning. Take 2 tablets ( 2 mg total) in addition to 4 mg tablet for a total dose of 6 mg daily. 60 tablet 11    ENVARSUS XR 4 mg Tb24 extended release tablet Take 1 tablet (4 mg total) by mouth daily. Take 1 tablet ( 4 mg total) in addition to 1 mg tablets for  Total daily dose of 6 mg 30 tablet 11    magnesium oxide-Mg AA chelate (MAGNESIUM, AMINO ACID CHELATE,) 133 mg Take 1 tablet by mouth two (2) times a day. 100 tablet 11    metFORMIN (GLUCOPHAGE-XR) 500 MG 24 hr tablet Take 1 tablet (500 mg total) by mouth in the morning. 180 tablet 3    mycophenolate (MYFORTIC) 180 MG EC tablet Take 2 tablets (360 mg total) by mouth Two (2) times a day. 180  tablet 3    olmesartan (BENICAR) 40 MG tablet Take 1 tablet (40 mg total) by mouth daily. 90 tablet 3    predniSONE (DELTASONE) 5 MG tablet Take 1 tablet (5 mg total) by mouth daily. Start taking when you have completed the 10 mg tablets.  Take 1 tablet ( 5 mg total) daily. 30 tablet 0    sulfamethoxazole-trimethoprim (BACTRIM) 400-80 mg per tablet Take 1 tablet (80 mg of trimethoprim total) by mouth Every Monday, Wednesday, and Friday for 16 days. 6 tablet     valGANciclovir (VALCYTE) 450 mg tablet Take 1 tablet (450 mg total) by mouth every other day for 16 days. 8 tablet 0    [DISCONTINUED] carvediloL (COREG) 6.25 MG tablet Take 1 tablet (6.25 mg total) by mouth Two (2) times a day. 180 tablet 3    [DISCONTINUED] ENVARSUS XR 1 mg Tb24 extended release tablet Take 1 tablet (1 mg) by mouth daily in addition to one 4 mg tablet  for a total daily dose of 5 mg 90 tablet 11    [DISCONTINUED] ENVARSUS XR 1 mg Tb24 extended release tablet Take 2 tablets ( 2 mg total) in addition to 4 mg tablet for a total dose of 6 mg daily 90 tablet 11    [DISCONTINUED] ENVARSUS XR 4 mg Tb24 extended release tablet Take 1 tablet (4 mg) by mouth daily in addition to one 1 mg tablet for a total daily dose of  5 mg 30 tablet 11    [DISCONTINUED] ENVARSUS XR 4 mg Tb24 extended release tablet Take 1 tablet (4 mg total) by mouth daily. Take 1 tablet ( 4 mg total) in addition to 1 mg tablets for  Total daily dose of 6 mg 30 tablet 11    [DISCONTINUED] mycophenolate (MYFORTIC) 180 MG EC tablet Take 3 tablets (540 mg total) by mouth Two (2) times a day. 180 tablet 3    [DISCONTINUED] omeprazole (PRILOSEC) 40 MG capsule Take 1 capsule (40 mg total) by mouth every other day. (Patient not taking: Reported on 01/13/2022) 45 capsule 3    [DISCONTINUED] predniSONE (DELTASONE) 10 MG tablet Starting Thursday 6/29, take 4 tablets ( 40 mg total) for 2 days, then take 3 tablets ( 30 mg total) for 2 days, then take 2 tablets ( 20 mg total ) for 2 days, then take 1 tablet ( 10 mg total) for 2 days.  Then begin taking 5 mg daily until instructed by provider to stop 20 tablet 0    [DISCONTINUED] predniSONE (DELTASONE) 5 MG tablet Take 1 tablet (5 mg total) by mouth daily. Start taking when you have completed the 10 mg tablets.  Take 1 tablet ( 5 mg total) daily. 30 tablet 0    [DISCONTINUED] sulfamethoxazole-trimethoprim (BACTRIM) 400-80 mg per tablet Take 1 tablet (80 mg of trimethoprim total) by mouth Every Monday, Wednesday, and Friday. 12 tablet 5    [DISCONTINUED] valGANciclovir (VALCYTE) 450 mg tablet Take 1 tablet (450 mg total) by mouth every other day. 30 tablet 2     No facility-administered encounter medications on file as of 02/08/2022.     Induction agent : alemtuzumab    CURRENT IMMUNOSUPPRESSION:   Envarsus 6 mg PO qd   prograf/Envarsus/cyclosporine goal: 6-8   myfortic 360 mg PO BID  Prednisone 5 mg daily    Patient is tolerating immunosuppression well    IMMUNOSUPPRESSION DRUG LEVELS:  Lab Results   Component Value Date    Tacrolimus, Trough 3.1 (L) 02/06/2022  Tacrolimus, Trough 6.8 01/30/2022    Tacrolimus, Trough 20.9 (HH) 01/13/2022     Envarsus level is accurate 24 hour trough    Graft function: stable  DSA: ntd  Biopsies to date: mild to moderate arteriosclerosis on 0 hour biopsy  UPC: 0.336 on 05/19/20  WBC/ANC:  wnl    Plan:  Will continue current regimen. Plan to stop prednisone in 2 weeks .    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     Current med: Valcyte 450 mg daily x 1 month following pulse steroids (End 7/26)  Estimated Creatinine Clearance: 32.6 mL/min (A) (based on SCr of 2 mg/dL (H)).    Lab Results   Component Value Date    CMV Quant <50 (H) 10/05/2020    CMV Quant 54 (H) 08/10/2020    CMV Quant <50 (H) 07/19/2020    CMV Quant 157 (H) 07/05/2020    CMV Quant 174 (H) 06/28/2020    CMV Quant 108 (H) 06/21/2020     T-cell immunity assay: CD8+ immunity, low CD4+ response    CV Prophylaxis:   The 10-year ASCVD risk score (Arnett DK, et al., 2019) is: 40.3%  Statin therapy: Indicated; currently on atorvastatin 40 mg daily  Plan: Repeating lipid panel. Continue to monitor     BP: Goal < 140/90. Clinic vitals reported above  Home BP ranges: 140-150/60-80. Denies dizziness.   Current meds include: Olmesartan 40 mg daily  Plan: No change    Anemia of CKD:  H/H:   Lab Results   Component Value Date    HGB 12.3 (L) 02/06/2022     Lab Results   Component Value Date    HCT 38.2 (L) 02/06/2022     Iron panel:  Lab Results   Component Value Date    IRON 64 (L) 07/05/2020    TIBC 212.9 07/05/2020    FERRITIN 1,820.0 (H) 04/20/2016     Lab Results   Component Value Date    Iron Saturation (%) 30 07/05/2020    Iron Saturation (%) 28 12/25/2011     Prior ESA use: none post txp  Plan: stable. Continue to monitor.     DM:   Lab Results   Component Value Date    A1C 5.9 (H) 09/28/2021   . Goal A1c < 7  History of Dm? Yes: T2DM  pre transplant that was diet controlled  Established with endocrinologist/PCP for BG managment? No  Currently on: Metformin XR 500 mg daily, Jardiance 25 mg daily  Home BS log: checks FBG daily 110s - 130s  Plan: No change    Electrolytes: WNL    GI/BM: pt reports  no complaints    Bone health:   Vitamin D Level:  46 . Goal > 30.   Last DEXA results:  none available  Current meds include: cholecalciferol 5000 units daily  Plan: Vitamin D level within goal. CA2+ high - stop Vit D. Continue to monitor.     Women's/Men's Health:  Frank Leach is a 71 y.o. male. Patient reports no men's/women's health issues  Plan: Continue to monitor    Gout - last gout flare >5 years ago  Meds currently on: allopurinol 100 mg daily  Plan: continue to monitor      Pharmacy preference:  [facility]    Adherence: Patient has average  understanding of medications; was not able to independently identify names/doses of immunosuppressants and OI meds.  Patient does not fill their own pill box on a regular basis at home.  His wife manages meds  Patient brought medication card:yes  Pill box:did not bring  Plan: Encouraged patient to start to learn meds and eventually help with pill box fill; provided moderate adherence counseling/intervention    I spent a total of 20 minutes face to face with the patient delivering clinical care and providing education/counseling.    Patient was reviewed with  Dr. Christy Sartorius  who was agreement with the stated plan:     During this visit, the following was completed:   BG log data assessment  BP log data assessment  Labs ordered and evaluated  complex treatment plan >1 DS     All questions/concerns were addressed to the patient's satisfaction.  __________________________________________  Olivia Mackie, PharmD, CPP  Solid Organ Transplant Clinical Pharmacist Practitioner

## 2022-02-08 ENCOUNTER — Institutional Professional Consult (permissible substitution): Admit: 2022-02-08 | Discharge: 2022-02-08 | Payer: MEDICARE

## 2022-02-08 ENCOUNTER — Ambulatory Visit: Admit: 2022-02-08 | Discharge: 2022-02-08 | Payer: MEDICARE | Attending: Nephrology | Primary: Nephrology

## 2022-02-08 DIAGNOSIS — Z940 Kidney transplant status: Principal | ICD-10-CM

## 2022-02-08 LAB — LIPID PANEL
CHOLESTEROL/HDL RATIO SCREEN: 2.2 (ref 1.0–4.5)
CHOLESTEROL: 94 mg/dL (ref <=200)
HDL CHOLESTEROL: 42 mg/dL (ref 40–60)
LDL CHOLESTEROL CALCULATED: 37 mg/dL — ABNORMAL LOW (ref 40–99)
NON-HDL CHOLESTEROL: 52 mg/dL — ABNORMAL LOW (ref 70–130)
TRIGLYCERIDES: 76 mg/dL (ref 0–150)
VLDL CHOLESTEROL CAL: 15.2 mg/dL (ref 12–42)

## 2022-02-08 MED ORDER — MYCOPHENOLATE SODIUM 180 MG TABLET,DELAYED RELEASE
ORAL_TABLET | Freq: Two times a day (BID) | ORAL | 3 refills | 45 days | Status: CP
Start: 2022-02-08 — End: ?
  Filled 2022-02-27: qty 180, 45d supply, fill #0

## 2022-02-08 MED ORDER — ENVARSUS XR 4 MG TABLET,EXTENDED RELEASE
ORAL_TABLET | Freq: Every day | ORAL | 11 refills | 30 days | Status: CP
Start: 2022-02-08 — End: ?
  Filled 2022-02-09: qty 30, 30d supply, fill #0

## 2022-02-08 MED ORDER — PREDNISONE 5 MG TABLET
ORAL_TABLET | Freq: Every day | ORAL | 0 refills | 30 days | Status: CP
Start: 2022-02-08 — End: 2022-03-10

## 2022-02-08 MED ORDER — ENVARSUS XR 1 MG TABLET,EXTENDED RELEASE
ORAL_TABLET | Freq: Every day | ORAL | 11 refills | 30 days | Status: CP
Start: 2022-02-08 — End: 2023-02-08

## 2022-02-08 NOTE — Unmapped (Signed)
AOBP:Right arm   medium cuff   Average:155/57  Pulse:57  1st reading:153/58  Pulse:58  2nd reading:155/58  Pulse:58  3rd reading:156/56  Pulse:55    See pharmacy encounter

## 2022-02-08 NOTE — Unmapped (Signed)
[facility] Refill Coordination Note    Specialty Medication(s) to be Shipped:   Transplant: Envarsus 4mg     Other medication(s) to be shipped: No additional medications requested for fill at this time     Frank Leach, DOB: 11-01-1950  Phone: [phone] (home)       All above HIPAA information was verified with patient.     Was a Nurse, learning disability used for this call? No    Completed refill call assessment today to schedule patient's medication shipment from the [facility] [phone]).  All relevant notes have been reviewed.     Specialty medication(s) and dose(s) confirmed: Regimen is correct and unchanged.   Changes to medications: Frank Leach reports no changes at this time.  Changes to insurance: No  New side effects reported not previously addressed with a pharmacist or physician: None reported  Questions for the pharmacist: No    Confirmed patient received a Conservation officer, historic buildings and a Surveyor, mining with first shipment. The patient will receive a drug information handout for each medication shipped and additional FDA Medication Guides as required.       DISEASE/MEDICATION-SPECIFIC INFORMATION        N/A    SPECIALTY MEDICATION ADHERENCE     Medication Adherence    Patient reported X missed doses in the last month: 0  Specialty Medication: Envarsus Xr 4mg   Patient is on additional specialty medications: No              Were doses missed due to medication being on hold? No    Envarsus Xr 4 mg: 3 days of medicine on hand     REFERRAL TO PHARMACIST     Referral to the pharmacist: Not needed      [facility]     Shipping address confirmed in Epic.     Delivery Scheduled: Yes, Expected medication delivery date: 02/09/22.     Medication will be delivered via Same Day Courier to the prescription address in Epic WAM.    Tera Helper   [facility] Specialty Pharmacist

## 2022-02-08 NOTE — Unmapped (Signed)
Assessment    Met w/ patient in [facility] today. Reviewed meds/symptoms. Any new medications? None - on 5 mg prednisone daily from taper                Fever/cold/flu symptoms denies   BP: 155/57 today/ Home BP reported 150/70s   BG: 110-140s fasting   Headache/Dizziness/Lightheaded: denies   Hand tremors: denies   Numbness/tingling: denies   Fevers/chills/sweats: denies   CP/SOB/palpatations: denies   Nausea/vomiting/heartburn: denies   Diarrhea/constipation: denies   UTI symptoms (burn/pain/itch/frequency/urgency/odor/color/foam): deneis   No visible or palpable edema    Appetite good; reports adequate hydration. 4-6 oz/bottles/fluid per day.    Pt reports being well rested and getting adequate exercise.    Continues to follow Covid/health safety precautions by taking care to mask, perform frequent hand hygeine and minimal public activity. Offered support and guidance for this process given their immune-suppressed state. We discussed reduced covid vaccine coverage for transplant patients and importance of continuing to mask and practice safe distancing. Commented that booster vaccines will likely be advised as an ongoing process.     Last envarsus taken 0900; held for this morning's labs. Current dose 6mg /daily; Myfortic 360 mg bid, Pred 5 mg daily - cont for 3 weeks, then can stop    Other complaints or concerns: n/a    Referrals needed: n/a Derm? Gyn? Mammo? GI?    Pt Follow up w/ labs weekly - BK, CMV    Immunization status: none in clinic today      Functional Score:  100     Able to carry on normal activity;  Minor signs or symptoms of disease.     Employment/work status: does not works full time

## 2022-02-09 NOTE — Unmapped (Signed)
Clinical Assessment Needed For: Dose Change  Medication: Envarsus XR 4mg  tablet  Last Fill Date/Day Supply: 12/22/2021 / 30 days  Copay $173.16  Was previous dose already scheduled to fill: Yes    Notes to Pharmacist: Scheduled to fil 07/06      Clinical Assessment Needed For: Dose Change  Medication: Envarsus XR 1mg  tablet  Last Fill Date/Day Supply: 01/05/2022 / 90 days  Copay $86.63  Was previous dose already scheduled to fill: No    Notes to Pharmacist: N/A      Clinical Assessment Needed For: Dose Change  Medication: Mycophenolate 180mg  EC tablet  Last Fill Date/Day Supply: 01/31/2022 / 30 days  Refill Too Soon until 02/17/2022  Was previous dose already scheduled to fill: No    Notes to Pharmacist: Will re-test on 07/14. Prednisone RFTS 07/15

## 2022-02-10 LAB — HLA DS POST TRANSPLANT
ANTI-DONOR DRW #1 MFI: 96 MFI
ANTI-DONOR HLA-A #1 MFI: 56 MFI
ANTI-DONOR HLA-A #2 MFI: 52 MFI
ANTI-DONOR HLA-B #1 MFI: 52 MFI
ANTI-DONOR HLA-B #2 MFI: 30 MFI
ANTI-DONOR HLA-C #1 MFI: 246 MFI
ANTI-DONOR HLA-C #2 MFI: 6 MFI
ANTI-DONOR HLA-DP #2 MFI: 71 MFI
ANTI-DONOR HLA-DQB #1 MFI: 41 MFI
ANTI-DONOR HLA-DQB #2 MFI: 361 MFI
ANTI-DONOR HLA-DR #1 MFI: 100 MFI
ANTI-DONOR HLA-DR #2 MFI: 98 MFI

## 2022-02-10 LAB — FSAB CLASS 1 ANTIBODY SPECIFICITY: HLA CLASS 1 ANTIBODY RESULT: POSITIVE

## 2022-02-10 LAB — FSAB CLASS 2 ANTIBODY SPECIFICITY: HLA CL2 AB RESULT: NEGATIVE

## 2022-02-12 NOTE — Unmapped (Signed)
Transplant Nephrology Clinic Visit      Date of visit: February 08 2022    Subjective/Interval:   Frank Leach is a 71 y.o. male s/p deceased kidney transplant on Jul 09, 2019 (Kidney) 2/2  HTN and T2DM .      Other PMH significant for diabetes (diet controlled pre transplant), gout, HTN     Today [name] denies abdominal pain, fever, chills, nausea, diarrhea or any other complaints.    He is s/p 3 doses of 125 mg solumedrol as OP for borderline rejection.      Assessment:  70 y.o. male status post deceased donor kidney transplant on 07/09/2019 for ESRD secondary to HTN who presents for routine follow up and post-transplant care.       Recommendations/Plan:     Allograft Function: DDKT KDPI: 42%2019/07/09  Renal function holding relatively stable with electrolytes and acid base balanced.   Creatinine 2.21 when Dr. Nestor Lewandowsky saw him was above his baseline  so he ordered a biopsy.      01/2022:  Kidney biopsy: mild inflammation with focal mild tubulitis.   Solumedrol 125 mg iv X 3 doses outpatient followed by rapid steroid taper  Keep myfortic at 360 bid ( low level CMV noted on labs)  Bactrim for PJP prophylaxis for 1 month  CMV D+/R+  Moderate risk: vaclyte 450 daily for 1 month    Baseline - 1.4-1.8  [place]oy/ BK     neg Date:  05/27/21  DSA     neg Date:  05/27/21  CMV:    <52 Date:  01/13/22, continue to monitor his CMV.  UPC:     0.328 Date:  01/13/22.     Bactrim and Valcyte to be completed 7/26    Plan to taper/stop prednisone in 2 weeks    Immunosuppression Management [High Risk Medical Decision Making For Drug Therapy Requiring Intensive Monitoring For Toxicity]:     Takes Envarsus at 9:00 a.m and is on 6 mg ( recent dose increase) and Myfortic 360 mg BID.  Tacro target: 4-6      CKD MBD:  Other  hypercalcemia:   1. Stop Vit D        Blood Pressure Management:  controlled: on olmesartan    Lipid Management: Last lipid panel on 05/27/2021. The 10-year ASCVD risk score (Arnett DK, et al., 2019) is: 37.5%  Currently taking atorvastatin     Electrolytes: stable    RCC s/p bilateral nephrectomy:   CT RMP 08/29/2018 showed no suspicious enhancing lesions.   Repeat CT findings from 04/07/20 were overall stable.  Needs a CT chest abdomen pelvis non contrast in September 2023.      D.M -well controlled.   Continue Jardiance + metformin.      Health Maintenance:   Colonoscopy: 04/30/2018 - 1 adenomatous polyp; repeat 5-10 years    Immunizations:   Flu Shot: Received fall 2022 per patient report.  Prevnar 13: 10/30/2013  Pneumovax: 04/24/2016, due 2022  No immunizations till 1 year post transplant.  COVID-19 (Moderna): 09/12/19, 10/14/19; booster received 04/07/20, 10/2020, 05/2021    Transplant History    Patient has ESRD secondary to HTN.  Patient has been on dialysis since 01/25/2012.  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 Patient was admitted for kidney transplant on 06/21/2019.      Transplant History:    Organ Received: Left DDKT, DBD, PHS, KDPI: 42%; 21h cold ischemia  Native Kidney Disease: HTN; cPRA: 0%  Date  of Transplant: 06/22/2019  Post-Transplant Course: HD once for high potassium  Prior Transplants: none  Induction: [place]  Date of Ureteral Stent Removal: 07/29/2019  CMV/EBV Status: CMV D+/R+  EBV D+/R+  Rejection Episodes: none  Donor Specific Antibodies: none  Results of Renal Imaging (pre and post):     Pre-Txp 08/29/2018 CT RMP  Sequela of bilateral nephrectomy. Interval decrease in linear soft tissue within the right nephrectomy bed, potentially postsurgical or fat necrosis. Unchanged linear tissue within the left nephrectomy bed. No suspicious enhancing lesions are visualized within the surgical beds    Post-Txp 06/25/2019 (txp kidney only)  The renal transplant was located in the left lower quadrant. Normal size and echogenicity.  No solid masses or calculi. Trace perinephric fluid adjacent to the lower pole of the kidney. Mild pelviectasis  - Perfusion: Using power Doppler, normal perfusion was seen throughout the renal parenchyma.  - Resistive indices in the renal transplant are stable compared with prior examination.  - Main renal artery/iliac artery: Patent. Again noted are 3 renal arteries. Resistive indices within the renal arteries are stable to minimally increased, now at or just above normal limits.  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    Medications  Current Outpatient Medications   Medication Sig Dispense Refill    acetaminophen (TYLENOL) 500 MG tablet Take 1-2 tablets (500-1,000 mg total) by mouth every six (6) hours as needed for pain or fever (> 38C). 100 tablet 0    allopurinoL (ZYLOPRIM) 100 MG tablet Take 1 tablet (100 mg total) by mouth every evening. 90 tablet 3    atorvastatin (LIPITOR) 40 MG tablet Take 1 tablet (40 mg total) by mouth daily. 90 tablet 3    cholecalciferol, vitamin D3-125 mcg, 5,000 unit,, 125 mcg (5,000 unit) tablet Take 1 tablet (125 mcg total) by mouth daily.      empagliflozin (JARDIANCE) 25 mg tablet Take 1 tablet (25 mg total) by mouth daily. 90 tablet 3    ENVARSUS XR 1 mg Tb24 extended release tablet Take 2 tablets (2 mg total) by mouth in the morning. Take 2 tablets ( 2 mg total) in addition to 4 mg tablet for a total dose of 6 mg daily. 60 tablet 11    ENVARSUS XR 4 mg Tb24 extended release tablet Take 1 tablet (4 mg total) by mouth daily. Take 1 tablet ( 4 mg total) in addition to 1 mg tablets for  Total daily dose of 6 mg 30 tablet 11    magnesium oxide-Mg AA chelate (MAGNESIUM, AMINO ACID CHELATE,) 133 mg Take 1 tablet by mouth two (2) times a day. 100 tablet 11    metFORMIN (GLUCOPHAGE-XR) 500 MG 24 hr tablet Take 1 tablet (500 mg total) by mouth in the morning. 180 tablet 3    mycophenolate (MYFORTIC) 180 MG EC tablet Take 2 tablets (360 mg total) by mouth Two (2) times a day. 180 tablet 3    olmesartan (BENICAR) 40 MG tablet Take 1 tablet (40 mg total) by mouth daily. 90 tablet 3    predniSONE (DELTASONE) 5 MG tablet Take 1 tablet (5 mg total) by mouth daily. Start taking when you have completed the 10 mg  tablets.  Take 1 tablet ( 5 mg total) daily. 30 tablet 0    sulfamethoxazole-trimethoprim (BACTRIM) 400-80 mg per tablet Take 1 tablet (80 mg of trimethoprim total) by mouth Every Monday, Wednesday, and Friday for 21 days. 8 tablet 0    valGANciclovir (VALCYTE) 450 mg tablet Take 1 tablet (450 mg total) by mouth every other day for 22 days. 11 tablet 0     No current facility-administered medications for this visit.     Physical Exam:   BP 155/57 (BP Site: R Arm, BP Position: Sitting, BP Cuff Size: Medium)  - Pulse 57  - Temp 36.6 ??C (97.8 ??F) (Temporal)  - Ht 170.2 cm (5' 7)  - Wt 67.4 kg (148 lb 9.6 oz)  - BMI 23.27 kg/m??     Nursing note and vitals reviewed.   Constitutional: Oriented to person, place, and time. Appears well-developed and well-nourished. No distress.   Head: Normocephalic and atraumatic.   Eyes: Right eye exhibits no discharge. Left eye exhibits no discharge. No scleral icterus.   Neck: Normal range of motion. Neck supple.   Cardiovascular: Normal rate and regular rhythm. Exam reveals no gallop and no friction rub. No murmur heard.   Pulmonary/Chest: Effort normal and breath sounds normal. No respiratory distress.   Abdominal: Soft. Non tender  Musculoskeletal: Normal range of motion. No edema and no tenderness.   Neurological: Alert and oriented to person, place, and time.   Skin: Skin is warm and dry.   Psychiatric: Normal mood and affect.       Laboratory Data and Imaging reviewed in EPIC    Follow-Up: Return to clinic in 9 weeks  Labs: every 2 weeks

## 2022-02-13 ENCOUNTER — Ambulatory Visit: Admit: 2022-02-13 | Discharge: 2022-02-14 | Payer: MEDICARE

## 2022-02-13 LAB — CBC W/ AUTO DIFF
BASOPHILS ABSOLUTE COUNT: 0.1 10*9/L (ref 0.0–0.1)
BASOPHILS RELATIVE PERCENT: 1.3 %
EOSINOPHILS ABSOLUTE COUNT: 0.1 10*9/L (ref 0.0–0.5)
EOSINOPHILS RELATIVE PERCENT: 2.3 %
HEMATOCRIT: 37.6 % — ABNORMAL LOW (ref 39.0–48.0)
HEMOGLOBIN: 12 g/dL — ABNORMAL LOW (ref 12.9–16.5)
LYMPHOCYTES ABSOLUTE COUNT: 1.1 10*9/L (ref 1.1–3.6)
LYMPHOCYTES RELATIVE PERCENT: 16.5 %
MEAN CORPUSCULAR HEMOGLOBIN CONC: 31.9 g/dL — ABNORMAL LOW (ref 32.0–36.0)
MEAN CORPUSCULAR HEMOGLOBIN: 29.4 pg (ref 25.9–32.4)
MEAN CORPUSCULAR VOLUME: 91.9 fL (ref 77.6–95.7)
MEAN PLATELET VOLUME: 7.7 fL (ref 6.8–10.7)
MONOCYTES ABSOLUTE COUNT: 0.6 10*9/L (ref 0.3–0.8)
MONOCYTES RELATIVE PERCENT: 9 %
NEUTROPHILS ABSOLUTE COUNT: 4.7 10*9/L (ref 1.8–7.8)
NEUTROPHILS RELATIVE PERCENT: 70.9 %
NUCLEATED RED BLOOD CELLS: 0 /100{WBCs} (ref <=4)
PLATELET COUNT: 204 10*9/L (ref 150–450)
RED BLOOD CELL COUNT: 4.1 10*12/L — ABNORMAL LOW (ref 4.26–5.60)
RED CELL DISTRIBUTION WIDTH: 16.5 % — ABNORMAL HIGH (ref 12.2–15.2)
WBC ADJUSTED: 6.6 10*9/L (ref 3.6–11.2)

## 2022-02-13 LAB — RENAL FUNCTION PANEL
ALBUMIN: 3.8 g/dL (ref 3.4–5.0)
ANION GAP: 7 mmol/L (ref 5–14)
BLOOD UREA NITROGEN: 34 mg/dL — ABNORMAL HIGH (ref 9–23)
BUN / CREAT RATIO: 18
CALCIUM: 10.6 mg/dL — ABNORMAL HIGH (ref 8.7–10.4)
CHLORIDE: 113 mmol/L — ABNORMAL HIGH (ref 98–107)
CO2: 22.7 mmol/L (ref 20.0–31.0)
CREATININE: 1.85 mg/dL — ABNORMAL HIGH
EGFR CKD-EPI (2021) MALE: 39 mL/min/{1.73_m2} — ABNORMAL LOW (ref >=60)
GLUCOSE RANDOM: 105 mg/dL (ref 70–179)
PHOSPHORUS: 2.8 mg/dL (ref 2.4–5.1)
POTASSIUM: 4.5 mmol/L (ref 3.4–4.8)
SODIUM: 143 mmol/L (ref 135–145)

## 2022-02-13 LAB — TACROLIMUS LEVEL, TROUGH: TACROLIMUS, TROUGH: 5.3 ng/mL (ref 5.0–15.0)

## 2022-02-13 LAB — MAGNESIUM: MAGNESIUM: 1.6 mg/dL (ref 1.6–2.6)

## 2022-02-14 LAB — BK VIRUS QUANTITATIVE PCR, BLOOD: BK BLOOD RESULT: NOT DETECTED

## 2022-02-14 LAB — CMV DNA, QUANTITATIVE, PCR
CMV QUANT LOG10: 1.7 {Log_IU}/mL — ABNORMAL HIGH (ref <0.00)
CMV QUANT: 50 [IU]/mL — ABNORMAL HIGH (ref <0)
CMV VIRAL LD: DETECTED — AB

## 2022-02-17 NOTE — Unmapped (Signed)
Therapy Update Follow Up: No issues - Copay = $55.50 (Mycophenolate)

## 2022-02-20 ENCOUNTER — Ambulatory Visit: Admit: 2022-02-20 | Discharge: 2022-02-21 | Payer: MEDICARE

## 2022-02-20 LAB — CBC W/ AUTO DIFF
BASOPHILS ABSOLUTE COUNT: 0.1 10*9/L (ref 0.0–0.1)
BASOPHILS RELATIVE PERCENT: 1.4 %
EOSINOPHILS ABSOLUTE COUNT: 0.1 10*9/L (ref 0.0–0.5)
EOSINOPHILS RELATIVE PERCENT: 2.1 %
HEMATOCRIT: 37.5 % — ABNORMAL LOW (ref 39.0–48.0)
HEMOGLOBIN: 12 g/dL — ABNORMAL LOW (ref 12.9–16.5)
LYMPHOCYTES ABSOLUTE COUNT: 0.9 10*9/L — ABNORMAL LOW (ref 1.1–3.6)
LYMPHOCYTES RELATIVE PERCENT: 17.1 %
MEAN CORPUSCULAR HEMOGLOBIN CONC: 32.1 g/dL (ref 32.0–36.0)
MEAN CORPUSCULAR HEMOGLOBIN: 29.6 pg (ref 25.9–32.4)
MEAN CORPUSCULAR VOLUME: 92.2 fL (ref 77.6–95.7)
MEAN PLATELET VOLUME: 7.9 fL (ref 6.8–10.7)
MONOCYTES ABSOLUTE COUNT: 0.5 10*9/L (ref 0.3–0.8)
MONOCYTES RELATIVE PERCENT: 9.7 %
NEUTROPHILS ABSOLUTE COUNT: 3.7 10*9/L (ref 1.8–7.8)
NEUTROPHILS RELATIVE PERCENT: 69.7 %
NUCLEATED RED BLOOD CELLS: 0 /100{WBCs} (ref <=4)
PLATELET COUNT: 193 10*9/L (ref 150–450)
RED BLOOD CELL COUNT: 4.06 10*12/L — ABNORMAL LOW (ref 4.26–5.60)
RED CELL DISTRIBUTION WIDTH: 16.5 % — ABNORMAL HIGH (ref 12.2–15.2)
WBC ADJUSTED: 5.3 10*9/L (ref 3.6–11.2)

## 2022-02-20 LAB — RENAL FUNCTION PANEL
ALBUMIN: 4.2 g/dL (ref 3.4–5.0)
ANION GAP: 8 mmol/L (ref 5–14)
BLOOD UREA NITROGEN: 37 mg/dL — ABNORMAL HIGH (ref 9–23)
BUN / CREAT RATIO: 19
CALCIUM: 10.6 mg/dL — ABNORMAL HIGH (ref 8.7–10.4)
CHLORIDE: 113 mmol/L — ABNORMAL HIGH (ref 98–107)
CO2: 21.5 mmol/L (ref 20.0–31.0)
CREATININE: 1.98 mg/dL — ABNORMAL HIGH
EGFR CKD-EPI (2021) MALE: 36 mL/min/{1.73_m2} — ABNORMAL LOW (ref >=60)
GLUCOSE RANDOM: 103 mg/dL (ref 70–179)
PHOSPHORUS: 2.8 mg/dL (ref 2.4–5.1)
POTASSIUM: 4.8 mmol/L (ref 3.4–4.8)
SODIUM: 142 mmol/L (ref 135–145)

## 2022-02-20 LAB — TACROLIMUS LEVEL, TROUGH: TACROLIMUS, TROUGH: 4.3 ng/mL — ABNORMAL LOW (ref 5.0–15.0)

## 2022-02-20 LAB — MAGNESIUM: MAGNESIUM: 1.7 mg/dL (ref 1.6–2.6)

## 2022-02-21 LAB — CMV DNA, QUANTITATIVE, PCR
CMV QUANT: <50 [IU]/mL — ABNORMAL HIGH (ref <0)
CMV VIRAL LD: DETECTED — AB

## 2022-02-23 LAB — BK VIRUS QUANTITATIVE PCR, BLOOD: BK BLOOD RESULT: NOT DETECTED

## 2022-02-24 NOTE — Unmapped (Signed)
[facility] Clinical Assessment & Refill Coordination Note    Frank Leach, [place]: June 20, 1951  Phone: [phone] (home)     All above HIPAA information was verified with patient.     Was a Nurse, learning disability used for this call? No    Specialty Medication(s):   Transplant: Envarsus 1mg , Envarsus 4mg ,  mycophenolic acid 180mg  and Prednisone 5mg      Current Outpatient Medications   Medication Sig Dispense Refill   ??? acetaminophen (TYLENOL) 500 MG tablet Take 1-2 tablets (500-1,000 mg total) by mouth every six (6) hours as needed for pain or fever (> 38C). 100 tablet 0   ??? allopurinoL (ZYLOPRIM) 100 MG tablet Take 1 tablet (100 mg total) by mouth every evening. 90 tablet 3   ??? atorvastatin (LIPITOR) 40 MG tablet Take 1 tablet (40 mg total) by mouth daily. 90 tablet 3   ??? cholecalciferol, vitamin D3-125 mcg, 5,000 unit,, 125 mcg (5,000 unit) tablet Take 1 tablet (125 mcg total) by mouth daily.     ??? empagliflozin (JARDIANCE) 25 mg tablet Take 1 tablet (25 mg total) by mouth daily. 90 tablet 3   ??? ENVARSUS XR 1 mg Tb24 extended release tablet Take 2 tablets (2 mg total) by mouth in the morning. Take 2 tablets ( 2 mg total) in addition to 4 mg tablet for a total dose of 6 mg daily. 60 tablet 11   ??? ENVARSUS XR 4 mg Tb24 extended release tablet Take 1 tablet (4 mg total) by mouth daily. Take 1 tablet ( 4 mg total) in addition to 1 mg tablets for  Total daily dose of 6 mg 30 tablet 11   ??? magnesium oxide-Mg AA chelate (MAGNESIUM, AMINO ACID CHELATE,) 133 mg Take 1 tablet by mouth two (2) times a day. 100 tablet 11   ??? metFORMIN (GLUCOPHAGE-XR) 500 MG 24 hr tablet Take 1 tablet (500 mg total) by mouth in the morning. 180 tablet 3   ??? mycophenolate (MYFORTIC) 180 MG EC tablet Take 2 tablets (360 mg total) by mouth Two (2) times a day. 180 tablet 3   ??? olmesartan (BENICAR) 40 MG tablet Take 1 tablet (40 mg total) by mouth daily. (Patient not taking: Reported on 02/24/2022) 90 tablet 3   ??? predniSONE (DELTASONE) 5 MG tablet Take 1 tablet (5 mg total) by mouth daily. Start taking when you have completed the 10 mg tablets.  Take 1 tablet ( 5 mg total) daily. 30 tablet 0   ??? sulfamethoxazole-trimethoprim (BACTRIM) 400-80 mg per tablet Take 1 tablet (80 mg of trimethoprim total) by mouth Every Monday, Wednesday, and Friday for 21 days. 8 tablet 0   ??? valGANciclovir (VALCYTE) 450 mg tablet Take 1 tablet (450 mg total) by mouth every other day for 22 days. 11 tablet 0     No current facility-administered medications for this visit.        Changes to medications: see spec meds below    No Known Allergies    Changes to allergies: No    SPECIALTY MEDICATION ADHERENCE     envarsus 1mg   : 8 days of medicine on hand   envarsus 4mg   : 20 days of medicine on hand   Prednisone 5mg   : 2 days of medicine on hand no longer taking  Mycophenolate 180mg   : 8 days of medicine on hand       Medication Adherence    Patient reported X missed doses in the last month: 0  Specialty Medication: mycophenolate 180mg   Patient is on additional specialty medications: Yes  Additional Specialty Medications: Envarsus 4mg   Patient Reported Additional Medication X Missed Doses in the Last Month: 0  Patient is on more than two specialty medications: Yes  Specialty Medication: envarsus 1mg   Patient Reported Additional Medication X Missed Doses in the Last Month: 0  Specialty Medication: prednisone 5mg   Patient Reported Additional Medication X Missed Doses in the Last Month: 0          Specialty medication(s) dose(s) confirmed: Patient reports changes to the regimen as follows: mycophenolate is now 360mg  bid, stopping prednisone when current supply gone ( a couple days)     Are there any concerns with adherence? No    Adherence counseling provided? Not needed    CLINICAL MANAGEMENT AND INTERVENTION      Clinical Benefit Assessment:    Do you feel the medicine is effective or helping your condition? Yes    Clinical Benefit counseling provided? Not needed    Adverse Effects Assessment:    Are you experiencing any side effects? No    Are you experiencing difficulty administering your medicine? No    Quality of Life Assessment:         How many days over the past month did your transplant  keep you from your normal activities? For example, brushing your teeth or getting up in the morning. 0    Have you discussed this with your provider? Not needed    Acute Infection Status:    Acute infections noted within Epic:  No active infections  Patient reported infection: None    Therapy Appropriateness:    Is therapy appropriate and patient progressing towards therapeutic goals? Yes, therapy is appropriate and should be continued    DISEASE/MEDICATION-SPECIFIC INFORMATION      N/A    PATIENT SPECIFIC NEEDS     - Does the patient have any physical, cognitive, or cultural barriers? No    - Is the patient high risk? Yes, patient is taking a REMS drug. Medication is dispensed in compliance with REMS program    - Does the patient require a Care Management Plan? No       SHIPPING     Specialty Medication(s) to be Shipped:   Transplant: Envarsus 1mg  and  mycophenolic acid 180mg     Other medication(s) to be shipped: jardiance   Call set up for 1.5 weeks out for other meds     Changes to insurance: No    Delivery Scheduled: Yes, Expected medication delivery date: 02/28/2022.     Medication will be delivered via UPS to the confirmed prescription address in [facility].    The patient will receive a drug information handout for each medication shipped and additional FDA Medication Guides as required.  Verified that patient has previously received a Conservation officer, historic buildings and a Surveyor, mining.    The patient or caregiver noted above participated in the development of this care plan and knows that they can request review of or adjustments to the care plan at any time.      All of the patient's questions and concerns have been addressed.    Thad Ranger   [facility] Specialty Pharmacist

## 2022-02-27 ENCOUNTER — Ambulatory Visit: Admit: 2022-02-27 | Discharge: 2022-02-28 | Payer: MEDICARE

## 2022-02-27 LAB — CBC W/ AUTO DIFF
BASOPHILS ABSOLUTE COUNT: 0.1 10*9/L (ref 0.0–0.1)
BASOPHILS RELATIVE PERCENT: 1.4 %
EOSINOPHILS ABSOLUTE COUNT: 0.1 10*9/L (ref 0.0–0.5)
EOSINOPHILS RELATIVE PERCENT: 2.6 %
HEMATOCRIT: 38 % — ABNORMAL LOW (ref 39.0–48.0)
HEMOGLOBIN: 12.1 g/dL — ABNORMAL LOW (ref 12.9–16.5)
LYMPHOCYTES ABSOLUTE COUNT: 1 10*9/L — ABNORMAL LOW (ref 1.1–3.6)
LYMPHOCYTES RELATIVE PERCENT: 19.8 %
MEAN CORPUSCULAR HEMOGLOBIN CONC: 31.8 g/dL — ABNORMAL LOW (ref 32.0–36.0)
MEAN CORPUSCULAR HEMOGLOBIN: 29.8 pg (ref 25.9–32.4)
MEAN CORPUSCULAR VOLUME: 93.7 fL (ref 77.6–95.7)
MEAN PLATELET VOLUME: 7.9 fL (ref 6.8–10.7)
MONOCYTES ABSOLUTE COUNT: 0.4 10*9/L (ref 0.3–0.8)
MONOCYTES RELATIVE PERCENT: 9 %
NEUTROPHILS ABSOLUTE COUNT: 3.3 10*9/L (ref 1.8–7.8)
NEUTROPHILS RELATIVE PERCENT: 67.2 %
NUCLEATED RED BLOOD CELLS: 0 /100{WBCs} (ref <=4)
PLATELET COUNT: 200 10*9/L (ref 150–450)
RED BLOOD CELL COUNT: 4.05 10*12/L — ABNORMAL LOW (ref 4.26–5.60)
RED CELL DISTRIBUTION WIDTH: 17.4 % — ABNORMAL HIGH (ref 12.2–15.2)
WBC ADJUSTED: 4.9 10*9/L (ref 3.6–11.2)

## 2022-02-27 LAB — RENAL FUNCTION PANEL
ALBUMIN: 4 g/dL (ref 3.4–5.0)
ANION GAP: 9 mmol/L (ref 5–14)
BLOOD UREA NITROGEN: 31 mg/dL — ABNORMAL HIGH (ref 9–23)
BUN / CREAT RATIO: 17
CALCIUM: 10.8 mg/dL — ABNORMAL HIGH (ref 8.7–10.4)
CHLORIDE: 112 mmol/L — ABNORMAL HIGH (ref 98–107)
CO2: 22.4 mmol/L (ref 20.0–31.0)
CREATININE: 1.84 mg/dL — ABNORMAL HIGH
EGFR CKD-EPI (2021) MALE: 39 mL/min/{1.73_m2} — ABNORMAL LOW (ref >=60)
GLUCOSE RANDOM: 95 mg/dL (ref 70–179)
PHOSPHORUS: 2.6 mg/dL (ref 2.4–5.1)
POTASSIUM: 4.8 mmol/L (ref 3.4–4.8)
SODIUM: 143 mmol/L (ref 135–145)

## 2022-02-27 LAB — MAGNESIUM: MAGNESIUM: 1.7 mg/dL (ref 1.6–2.6)

## 2022-02-27 LAB — TACROLIMUS LEVEL, TROUGH: TACROLIMUS, TROUGH: 6.7 ng/mL (ref 5.0–15.0)

## 2022-02-27 MED FILL — JARDIANCE 25 MG TABLET: ORAL | 30 days supply | Qty: 30 | Fill #4

## 2022-02-27 MED FILL — ENVARSUS XR 1 MG TABLET,EXTENDED RELEASE: ORAL | 90 days supply | Qty: 180 | Fill #0

## 2022-02-28 LAB — CMV DNA, QUANTITATIVE, PCR
CMV QUANT LOG10: 1.79 {Log_IU}/mL — ABNORMAL HIGH (ref <0.00)
CMV QUANT: 62 [IU]/mL — ABNORMAL HIGH (ref <0)
CMV VIRAL LD: DETECTED — AB

## 2022-03-02 LAB — BK VIRUS QUANTITATIVE PCR, BLOOD: BK BLOOD RESULT: NOT DETECTED

## 2022-03-03 MED ORDER — PREDNISONE 5 MG TABLET
ORAL_TABLET | Freq: Every day | ORAL | 0 refills | 30 days
Start: 2022-03-03 — End: ?

## 2022-03-03 NOTE — Unmapped (Signed)
[facility] Refill Coordination Note    Specialty Medication(s) to be Shipped:   Transplant: Prednisone 5mg     Other medication(s) to be shipped: No additional medications requested for fill at this time     Frank Leach, DOB: 09-15-1950  Phone: [phone] (home)       All above HIPAA information was verified with patient.     Was a Nurse, learning disability used for this call? No    Completed refill call assessment today to schedule patient's medication shipment from the [facility] [phone]).  All relevant notes have been reviewed.     Specialty medication(s) and dose(s) confirmed: Regimen is correct and unchanged.   Changes to medications: [name] reports no changes at this time.  Changes to insurance: No  New side effects reported not previously addressed with a pharmacist or physician: None reported  Questions for the pharmacist: No    Confirmed patient received a Conservation officer, historic buildings and a Surveyor, mining with first shipment. The patient will receive a drug information handout for each medication shipped and additional FDA Medication Guides as required.       DISEASE/MEDICATION-SPECIFIC INFORMATION        N/A    SPECIALTY MEDICATION ADHERENCE     Medication Adherence    Patient reported X missed doses in the last month: 0  Specialty Medication: prednisone 5 mg  Patient is on additional specialty medications: No              Were doses missed due to medication being on hold? No    prednisone 5 mg: 7 days of medicine on hand       REFERRAL TO PHARMACIST     Referral to the pharmacist: Not needed      [facility]     Shipping address confirmed in Epic.     Delivery Scheduled: Yes, Expected medication delivery date: 03/08/22.     Medication will be delivered via UPS to the prescription address in Epic [facility].    [name]   [facility] Specialty Technician

## 2022-03-07 ENCOUNTER — Ambulatory Visit: Admit: 2022-03-07 | Discharge: 2022-03-08 | Payer: MEDICARE

## 2022-03-07 LAB — CBC W/ AUTO DIFF
BASOPHILS ABSOLUTE COUNT: 0.1 10*9/L (ref 0.0–0.1)
BASOPHILS RELATIVE PERCENT: 1.1 %
EOSINOPHILS ABSOLUTE COUNT: 0.1 10*9/L (ref 0.0–0.5)
EOSINOPHILS RELATIVE PERCENT: 1.2 %
HEMATOCRIT: 37.2 % — ABNORMAL LOW (ref 39.0–48.0)
HEMOGLOBIN: 11.7 g/dL — ABNORMAL LOW (ref 12.9–16.5)
LYMPHOCYTES ABSOLUTE COUNT: 1 10*9/L — ABNORMAL LOW (ref 1.1–3.6)
LYMPHOCYTES RELATIVE PERCENT: 16.2 %
MEAN CORPUSCULAR HEMOGLOBIN CONC: 31.4 g/dL — ABNORMAL LOW (ref 32.0–36.0)
MEAN CORPUSCULAR HEMOGLOBIN: 29.5 pg (ref 25.9–32.4)
MEAN CORPUSCULAR VOLUME: 94.1 fL (ref 77.6–95.7)
MEAN PLATELET VOLUME: 7.7 fL (ref 6.8–10.7)
MONOCYTES ABSOLUTE COUNT: 0.7 10*9/L (ref 0.3–0.8)
MONOCYTES RELATIVE PERCENT: 11.1 %
NEUTROPHILS ABSOLUTE COUNT: 4.3 10*9/L (ref 1.8–7.8)
NEUTROPHILS RELATIVE PERCENT: 70.4 %
NUCLEATED RED BLOOD CELLS: 0 /100{WBCs} (ref <=4)
PLATELET COUNT: 227 10*9/L (ref 150–450)
RED BLOOD CELL COUNT: 3.96 10*12/L — ABNORMAL LOW (ref 4.26–5.60)
RED CELL DISTRIBUTION WIDTH: 17.3 % — ABNORMAL HIGH (ref 12.2–15.2)
WBC ADJUSTED: 6.1 10*9/L (ref 3.6–11.2)

## 2022-03-07 LAB — RENAL FUNCTION PANEL
ALBUMIN: 4 g/dL (ref 3.4–5.0)
ANION GAP: 6 mmol/L (ref 5–14)
BLOOD UREA NITROGEN: 36 mg/dL — ABNORMAL HIGH (ref 9–23)
BUN / CREAT RATIO: 18
CALCIUM: 11 mg/dL — ABNORMAL HIGH (ref 8.7–10.4)
CHLORIDE: 112 mmol/L — ABNORMAL HIGH (ref 98–107)
CO2: 23.2 mmol/L (ref 20.0–31.0)
CREATININE: 1.96 mg/dL — ABNORMAL HIGH
EGFR CKD-EPI (2021) MALE: 36 mL/min/{1.73_m2} — ABNORMAL LOW (ref >=60)
GLUCOSE RANDOM: 92 mg/dL (ref 70–179)
PHOSPHORUS: 2.6 mg/dL (ref 2.4–5.1)
POTASSIUM: 4.7 mmol/L (ref 3.4–4.8)
SODIUM: 141 mmol/L (ref 135–145)

## 2022-03-07 LAB — TACROLIMUS LEVEL, TROUGH: TACROLIMUS, TROUGH: 5.1 ng/mL (ref 5.0–15.0)

## 2022-03-07 LAB — MAGNESIUM: MAGNESIUM: 1.6 mg/dL (ref 1.6–2.6)

## 2022-03-08 LAB — BK VIRUS QUANTITATIVE PCR, BLOOD: BK BLOOD RESULT: NOT DETECTED

## 2022-03-08 NOTE — Unmapped (Signed)
Reviewed recent labs with Dr. Christy Sartorius, pt advised to stop taking Vitamin D supplement for rising calcium levels.  Pt reports he stopped the medication a week ago per Dr. Christy Sartorius.  No other needs at this time.

## 2022-03-09 LAB — CMV DNA, QUANTITATIVE, PCR
CMV QUANT LOG10: 1.93 {Log_IU}/mL — ABNORMAL HIGH (ref <0.00)
CMV QUANT: 85 [IU]/mL — ABNORMAL HIGH (ref <0)
CMV VIRAL LD: DETECTED — AB

## 2022-03-09 MED FILL — ENVARSUS XR 4 MG TABLET,EXTENDED RELEASE: ORAL | 30 days supply | Qty: 30 | Fill #1

## 2022-03-09 NOTE — Unmapped (Signed)
[facility] Refill Coordination Note    Specialty Medication(s) to be Shipped:   Transplant: Envarsus 4mg     Other medication(s) to be shipped: No additional medications requested for fill at this time     Frank Leach, DOB: 1951/01/07  Phone: [phone] (home)       All above HIPAA information was verified with patient.     Was a Nurse, learning disability used for this call? No    Completed refill call assessment today to schedule patient's medication shipment from the [facility] [phone]).  All relevant notes have been reviewed.     Specialty medication(s) and dose(s) confirmed: Regimen is correct and unchanged.   Changes to medications: Frank Leach reports no changes at this time.  Changes to insurance: No  New side effects reported not previously addressed with a pharmacist or physician: None reported  Questions for the pharmacist: No    Confirmed patient received a Conservation officer, historic buildings and a Surveyor, mining with first shipment. The patient will receive a drug information handout for each medication shipped and additional FDA Medication Guides as required.       DISEASE/MEDICATION-SPECIFIC INFORMATION        N/A    SPECIALTY MEDICATION ADHERENCE     Medication Adherence    Patient reported X missed doses in the last month: 0  Specialty Medication: ENVARSUS XR 4 mg Tb24 extended release tablet (tacrolimus)  Patient is on additional specialty medications: No              Were doses missed due to medication being on hold? No  ENVARSUS XR 4 mg  2 days worth of medication on hand.      REFERRAL TO PHARMACIST     Referral to the pharmacist: Not needed      [facility]     Shipping address confirmed in Epic.     Delivery Scheduled: Yes, Expected medication delivery date: 03/10/22.     Medication will be delivered via UPS to the prescription address in Epic [facility].    Frank Leach   [facility] Specialty Technician

## 2022-03-30 NOTE — Unmapped (Signed)
[facility] Refill Coordination Note    Specialty Medication(s) to be Shipped:   Transplant: Envarsus 4mg     Other medication(s) to be shipped: jardiance, olmesartan     Frank Leach, DOB: 05-Aug-1951  Phone: [phone] (home)       All above HIPAA information was verified with patient.     Was a Nurse, learning disability used for this call? No    Completed refill call assessment today to schedule patient's medication shipment from the [facility] [phone]).  All relevant notes have been reviewed.     Specialty medication(s) and dose(s) confirmed: Regimen is correct and unchanged.   Changes to medications: Fue reports no changes at this time.  Changes to insurance: No  New side effects reported not previously addressed with a pharmacist or physician: None reported  Questions for the pharmacist: No    Confirmed patient received a Conservation officer, historic buildings and a Surveyor, mining with first shipment. The patient will receive a drug information handout for each medication shipped and additional FDA Medication Guides as required.       DISEASE/MEDICATION-SPECIFIC INFORMATION        N/A    SPECIALTY MEDICATION ADHERENCE     Medication Adherence    Patient reported X missed doses in the last month: 0  Specialty Medication: Enbvarsus  Patient is on additional specialty medications: No  Patient is on more than two specialty medications: No  Any gaps in refill history greater than 2 weeks in the last 3 months: no  Demonstrates understanding of importance of adherence: yes  Informant: patient                          Were doses missed due to medication being on hold? No    Envarsus 4mg : Patient has 7 days of medication on hand     REFERRAL TO PHARMACIST     Referral to the pharmacist: Not needed      [facility]     Shipping address confirmed in Epic.     Delivery Scheduled: Yes, Expected medication delivery date: 8/29.     Medication will be delivered via UPS to the prescription address in Epic WAM.    [name]   [facility] Specialty Technician

## 2022-04-03 DIAGNOSIS — Z794 Long term (current) use of insulin: Principal | ICD-10-CM

## 2022-04-03 DIAGNOSIS — E0801 Diabetes mellitus due to underlying condition with hyperosmolarity with coma: Principal | ICD-10-CM

## 2022-04-03 MED FILL — JARDIANCE 25 MG TABLET: ORAL | 30 days supply | Qty: 30 | Fill #5

## 2022-04-03 MED FILL — OLMESARTAN 40 MG TABLET: ORAL | 90 days supply | Qty: 90 | Fill #2

## 2022-04-03 MED FILL — ENVARSUS XR 4 MG TABLET,EXTENDED RELEASE: ORAL | 30 days supply | Qty: 30 | Fill #2

## 2022-04-11 ENCOUNTER — Ambulatory Visit: Admit: 2022-04-11 | Discharge: 2022-04-12 | Payer: MEDICARE

## 2022-04-11 LAB — CBC W/ AUTO DIFF
BASOPHILS ABSOLUTE COUNT: 0.1 10*9/L (ref 0.0–0.1)
BASOPHILS RELATIVE PERCENT: 1.6 %
EOSINOPHILS ABSOLUTE COUNT: 0.2 10*9/L (ref 0.0–0.5)
EOSINOPHILS RELATIVE PERCENT: 4.5 %
HEMATOCRIT: 38.5 % — ABNORMAL LOW (ref 39.0–48.0)
HEMOGLOBIN: 12.2 g/dL — ABNORMAL LOW (ref 12.9–16.5)
LYMPHOCYTES ABSOLUTE COUNT: 0.8 10*9/L — ABNORMAL LOW (ref 1.1–3.6)
LYMPHOCYTES RELATIVE PERCENT: 20.1 %
MEAN CORPUSCULAR HEMOGLOBIN CONC: 31.6 g/dL — ABNORMAL LOW (ref 32.0–36.0)
MEAN CORPUSCULAR HEMOGLOBIN: 29.7 pg (ref 25.9–32.4)
MEAN CORPUSCULAR VOLUME: 93.9 fL (ref 77.6–95.7)
MEAN PLATELET VOLUME: 7.5 fL (ref 6.8–10.7)
MONOCYTES ABSOLUTE COUNT: 0.7 10*9/L (ref 0.3–0.8)
MONOCYTES RELATIVE PERCENT: 18 %
NEUTROPHILS ABSOLUTE COUNT: 2.2 10*9/L (ref 1.8–7.8)
NEUTROPHILS RELATIVE PERCENT: 55.8 %
NUCLEATED RED BLOOD CELLS: 0 /100{WBCs} (ref <=4)
PLATELET COUNT: 230 10*9/L (ref 150–450)
RED BLOOD CELL COUNT: 4.1 10*12/L — ABNORMAL LOW (ref 4.26–5.60)
RED CELL DISTRIBUTION WIDTH: 16.3 % — ABNORMAL HIGH (ref 12.2–15.2)
WBC ADJUSTED: 4 10*9/L (ref 3.6–11.2)

## 2022-04-11 LAB — TACROLIMUS LEVEL, TROUGH: TACROLIMUS, TROUGH: 5.1 ng/mL (ref 5.0–15.0)

## 2022-04-11 LAB — RENAL FUNCTION PANEL
ALBUMIN: 3.9 g/dL (ref 3.4–5.0)
ANION GAP: 6 mmol/L (ref 5–14)
BLOOD UREA NITROGEN: 26 mg/dL — ABNORMAL HIGH (ref 9–23)
BUN / CREAT RATIO: 13
CALCIUM: 10.4 mg/dL (ref 8.7–10.4)
CHLORIDE: 114 mmol/L — ABNORMAL HIGH (ref 98–107)
CO2: 22.3 mmol/L (ref 20.0–31.0)
CREATININE: 2 mg/dL — ABNORMAL HIGH
EGFR CKD-EPI (2021) MALE: 35 mL/min/{1.73_m2} — ABNORMAL LOW (ref >=60)
GLUCOSE RANDOM: 102 mg/dL (ref 70–179)
PHOSPHORUS: 3.4 mg/dL (ref 2.4–5.1)
POTASSIUM: 4.7 mmol/L (ref 3.4–4.8)
SODIUM: 142 mmol/L (ref 135–145)

## 2022-04-11 LAB — MAGNESIUM: MAGNESIUM: 1.6 mg/dL (ref 1.6–2.6)

## 2022-04-11 MED FILL — MG-PLUS-PROTEIN 133 MG TABLET: ORAL | 50 days supply | Qty: 100 | Fill #6

## 2022-04-11 MED FILL — ALLOPURINOL 100 MG TABLET: ORAL | 90 days supply | Qty: 90 | Fill #3

## 2022-04-12 LAB — CMV DNA, QUANTITATIVE, PCR
CMV QUANT LOG10: 2.23 {Log_IU}/mL — ABNORMAL HIGH (ref <0.00)
CMV QUANT: 169 [IU]/mL — ABNORMAL HIGH (ref <0)
CMV VIRAL LD: DETECTED — AB

## 2022-04-14 ENCOUNTER — Ambulatory Visit: Admit: 2022-04-14 | Discharge: 2022-04-14 | Disposition: A | Discharge status: Home / Self Care | Payer: MEDICARE

## 2022-04-14 DIAGNOSIS — U071 COVID-19: Principal | ICD-10-CM

## 2022-04-14 MED ORDER — MOLNUPIRAVIR 200 MG CAPSULE (EUA)
ORAL_CAPSULE | Freq: Two times a day (BID) | ORAL | 0 refills | 5 days | Status: CP
Start: 2022-04-14 — End: 2022-04-19
  Filled 2022-04-14: qty 40, 5d supply, fill #0

## 2022-04-14 NOTE — Unmapped (Signed)
[facility]  Emergency Department Provider Note        History     Chief Complaint  Cough      HPI   Frank Leach is a 71 y.o. 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  Symptoms started 4 days ago and worsened last night.  He did a home COVID test this morning which showed 2 lines and he is unsure whether this reflects a positive test.  Denies chest pain, shortness of breath, fever.  No known sick contacts. No interventions tried yet. He is tolerating PO intake.       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)    Kidney replaced by transplant    Immunosuppression (CMS-HCC)       Past Surgical History:   Procedure Laterality Date    AV FISTULA PLACEMENT      PR COLONOSCOPY FLX DX W/COLLJ SPEC WHEN PFRMD N/A 04/30/2018    Procedure: COLONOSCOPY, FLEXIBLE, PROXIMAL TO SPLENIC FLEXURE; DIAGNOSTIC, W/WO COLLECTION SPECIMEN BY BRUSH OR WASH;  Surgeon: Luanne Bras, MD;  Location: [facility];  Service: Gastroenterology    PR COLONOSCOPY W/BIOPSY SINGLE/MULTIPLE N/A 04/30/2018    Procedure: COLONOSCOPY, FLEXIBLE, PROXIMAL TO SPLENIC FLEXURE; WITH BIOPSY, SINGLE OR MULTIPLE;  Surgeon: Luanne Bras, MD;  Location: [facility];  Service: Gastroenterology    PR COLSC FLX W/RMVL OF TUMOR POLYP LESION SNARE TQ N/A 04/30/2018    Procedure: COLONOSCOPY FLEX; W/REMOV TUMOR/LES BY SNARE;  Surgeon: Luanne Bras, MD;  Location: [facility];  Service: Gastroenterology    PR EXPLORATORY OF ABDOMEN Midline 04/14/2016    Procedure: EXPLORATORY LAPAROTOMY, EXPLORATORY CELIOTOMY WITH OR WITHOUT BIOPSY(S);  Surgeon: Leona Carry, MD;  Location: MA[facility] OR [facility];  Service: Transplant    PR EXPLORATORY OF ABDOMEN N/A 04/16/2016    Procedure: EXPLORATORY LAPAROTOMY, EXPLORATORY CELIOTOMY WITH OR WITHOUT BIOPSY(S);  Surgeon: Leona Carry, MD;  Location: MAIN OR [facility];  Service: Transplant    PR LAP, RADICAL NEPHRECTOMY Right 12/27/2016    Procedure: Robotic Xi Laparoscopy; Radical Nephrectomy (Incl Remove Gerota`S Fascia, Fatty Tissue, Reg Lymph Node, Adrenalectomy);  Surgeon: Tomie China, MD;  Location: MAIN OR [facility];  Service: Urology    PR NEGATIVE PRESSURE WOUND THERAPY DME >50 SQ CM N/A 04/12/2016    Procedure: NEG PRESS WOUND TX (VAC ASSIST) INCL TOPICALS, PER SESSION, TSA GREATER THAN/= 50 CM SQUARED;  Surgeon: Leona Carry, MD;  Location: MAIN OR [facility];  Service: Transplant    PR NEGATIVE PRESSURE WOUND THERAPY DME >50  SQ CM Bilateral 04/14/2016    Procedure: NEG PRESS WOUND TX (VAC ASSIST) INCL TOPICALS, PER SESSION, TSA GREATER THAN/= 50 CM SQUARED;  Surgeon: Leona Carry, MD;  Location: MAIN OR [place];  Service: Transplant    PR NEGATIVE PRESSURE WOUND THERAPY DME >50 SQ CM N/A 04/16/2016    Procedure: NEG PRESS WOUND TX (VAC ASSIST) INCL TOPICALS, PER SESSION, TSA GREATER THAN/= 50 CM SQUARED;  Surgeon: Leona Carry, MD;  Location: MAIN OR [facility];  Service: Transplant    PR NEPHRECTOMY, W/PART. URETECTOMY Bilateral 04/11/2016    Procedure: LAPAROSCOPY, SURGICAL, NEPHRECTOMY WITH TOTAL URETERECTOMY;  Surgeon: Leona Carry, MD;  Location: MAIN OR [facility];  Service: Transplant    PR REMV KIDNEY,W/RIB RESECTION Bilateral 04/11/2016    Procedure: NEPHRECTOMY, INCLUDING PARTIAL URETERECTOMY, ANY OPEN APPROACH INCLUDING RIB RESECTION;  Surgeon: Leona Carry, MD;  Location: MAIN OR [facility];  Service: Transplant    PR TRANSPLANT,PREP LIVING  RENAL GRAFT N/A 06/22/2019    Procedure: BACKBENCH STD PREP LIVING DONOR RENAL ALLGRFT (OPEN/LAPROSC) PRIOR TO TRANSPLANT, INC DISSECT/REM AS NECESS;  Surgeon: Leona Carry, MD;  Location: MAIN OR [facility];  Service: Transplant    PR TRANSPLANTATION OF KIDNEY N/A 06/22/2019    Procedure: RENAL ALLOTRANSPLANTATION, IMPLANTATION OF GRAFT; WITHOUT RECIPIENT NEPHRECTOMY;  Surgeon: Leona Carry, MD;  [facility];  Service: Transplant    SPLENECTOMY         No current facility-administered medications for this encounter.    Current Outpatient Medications:     acetaminophen (TYLENOL) 500 MG tablet, Take 1-2 tablets (500-1,000 mg total) by mouth every six (6) hours as needed for pain or fever (> 38C)., Disp: 100 tablet, Rfl: 0    allopurinoL (ZYLOPRIM) 100 MG tablet, Take 1 tablet (100 mg total) by mouth every evening., Disp: 90 tablet, Rfl: 3    atorvastatin (LIPITOR) 40 MG tablet, Take 1 tablet (40 mg total) by mouth daily., Disp: 90 tablet, Rfl: 3    empagliflozin (JARDIANCE) 25 mg tablet, Take 1 tablet (25 mg total) by mouth daily., Disp: 90 tablet, Rfl: 3    ENVARSUS XR 1 mg Tb24 extended release tablet, Take 2 tablets (2 mg total) by mouth in the morning. Take 2 tablets ( 2 mg total) in addition to 4 mg tablet for a total dose of 6 mg daily., Disp: 60 tablet, Rfl: 11    ENVARSUS XR 4 mg Tb24 extended release tablet, Take 1 tablet (4 mg total) by mouth daily. Take 1 tablet ( 4 mg total) in addition to 1 mg tablets for  Total daily dose of 6 mg, Disp: 30 tablet, Rfl: 11    magnesium oxide-Mg AA chelate (MAGNESIUM, AMINO ACID CHELATE,) 133 mg, Take 1 tablet by mouth two (2) times a day., Disp: 100 tablet, Rfl: 11    metFORMIN (GLUCOPHAGE-XR) 500 MG 24 hr tablet, Take 1 tablet (500 mg total) by mouth in the morning., Disp: 180 tablet, Rfl: 3    molnupiravir, EUA, (LAGEVRIO) 200 mg capsule, Take 4 capsules (800 mg total) by mouth every twelve (12) hours for 5 days., Disp: 40 capsule, Rfl: 0    mycophenolate (MYFORTIC) 180 MG EC tablet, Take 2 tablets (360 mg total) by mouth Two (2) times a day., Disp: 180 tablet, Rfl: 3    olmesartan (BENICAR) 40 MG tablet, Take 1 tablet (40 mg total) by mouth daily. (Patient not taking: Reported on 02/24/2022), Disp: 90 tablet, Rfl: 3    Allergies  Patient has  no known allergies.    Family History   Problem Relation Age of Onset    Kidney disease Mother     Cancer Father     Kidney disease Brother        Social History  Social History     Tobacco Use    Smoking status: Never    Smokeless tobacco: Never   Vaping Use    Vaping Use: Never used   Substance Use Topics    Alcohol use: No    Drug use: No       Review of Systems    A complete review of systems was performed and is negative other than as addressed in the HPI.    Physical Exam     ED Triage Vitals [04/14/22 0823]   Enc Vitals Group      BP 112/99      Heart Rate 80      SpO2 Pulse       Resp 16      Temp 37.6 ??C (99.7 ??F)      Temp Source Oral      SpO2 96 %      Weight       Height       Head Circumference       Peak Flow       Pain Score       Pain Loc       Pain Edu?       Excl. in GC?        Constitutional: Alert and oriented. Well appearing and in no distress.  Eyes: No scleral icterus.  ENT       Head: Normocephalic and atraumatic.       Mouth/Throat: Mucous membranes are moist.       Neck: Supple  Cardiovascular: Normal rate, extremities well perfused.  Respiratory: Normal respiratory effort. Good tidal volume. No rales, rhonchi, stridor or wheeze.   Gastrointestinal: Soft and non-distended.   Genitourinary: Bladder is non-distended.   Musculoskeletal: Patient freely moves all extremities. No warm or swollen joints.   Neurologic: Normal speech and language. No gross focal neurologic deficits are appreciated.  Skin: Skin is warm, dry and intact. No rash noted.  Psychiatric: Mood and affect are normal. Speech and behavior are normal.      Radiology     No orders to display         Labs     Results for orders placed or performed during the hospital encounter of 04/14/22   RAPID INFLUENZA/RSV/COVID PCR    Specimen: Nasopharyngeal Swab   Result Value Ref Range    SARS-CoV-2 PCR Positive (A) Negative    Influenza A Negative Negative    Influenza B Negative Negative    RSV Negative Negative    SARS-CoV-2 CT Value 14.7      *Note: Due to a large number of results and/or encounters for the requested time period, some results have not been displayed. A complete set of results can be found in Results Review.       Pertinent labs & imaging results that were available during my care of the patient were reviewed by me and considered in my medical decision making (see chart for details).      ED Clinical Impression     1. COVID-19          ED Course, Assessment and Plan     This gentleman on immunosuppressive medication has mild viral URI symptoms and  a cough with a positive home COVID test.  He is reassuringly well appearing and does not have clinical evidence for pneumonia.  His VS are stable with a SpO2 of 96%.  Despite this, he is at high risk for more severe disease and eventual hospitalization.  Plan to confirm COVID status with PCR test at his request and will consider antiviral therapy.     8:47 AM   Chart review shows that paxlovid interacts with his envarsus and lipitor.  I have sent a secure message to his transplant doctor inquiring whether he would recommend holding the envarsus for paxlovid.    10:03 AM  Discussed the case with his nephrologist Dr. Erling Conte.  It was recommended he take molnupiravir and hold his myfortic for the next 7 days.  They will follow up with him in 1 week.  Return precautions given.      Medications - No data to display      Discharge Medications:  New Prescriptions MOLNUPIRAVIR, EUA, (LAGEVRIO) 200 MG CAPSULE    Take 4 capsules (800 mg total) by mouth every twelve (12) hours for 5 days.        ____________________________________________  Disposition: discharge home.     After careful consideration of Frank Leach's presentation and clinical course, there does not appear to be an indication for further emergent evaluation or intervention, nor is there an indication for admission to the hospital.  At the time of discharge I do not believe, to the best of my medical judgment, that an emergancy medical condition exists.  Frank Leach is discharged home in stable and satisfactory condition.  Discharge diagnosis, instructions and plan were discussed and understood.  Signs and symptoms that should prompt re-evaluation in the emergency department were discussed and understood.  Both verbal and written discharge instructions were provided.      Additional Medical Decision Making     I have reviewed the vital signs and the nursing notes. Labs and radiology results that were available during my care of the patient were independently reviewed by me and considered in my medical decision making.        I have reviewed the triage vital signs and the nursing notes.       Helyn App, [place]  04/14/22 1005

## 2022-04-14 NOTE — Unmapped (Signed)
Pt reports having a cough since Tuesday.

## 2022-04-21 NOTE — Unmapped (Signed)
Called patient to check in after recent COVID diagnosis.  Pt reports his symptoms are mostly resolved, he has a slight cough and he re-started his myfortic 360 mg bid yesterday after he completed molnupiravir.  Dr. Christy Sartorius updated.

## 2022-04-25 NOTE — Unmapped (Signed)
Frank Leach has been contacted in regards to their refill of mycophenolate 180 mg and Envarsus 1 mg. At this time, they have declined refill due to patient having 20 doses remaining. Refill assessment call date has been updated per the patient's request.

## 2022-04-27 MED FILL — JARDIANCE 25 MG TABLET: ORAL | 30 days supply | Qty: 30 | Fill #6

## 2022-05-08 ENCOUNTER — Ambulatory Visit: Admit: 2022-05-08 | Discharge: 2022-05-09 | Payer: MEDICARE

## 2022-05-08 LAB — RENAL FUNCTION PANEL
ALBUMIN: 4 g/dL (ref 3.4–5.0)
ANION GAP: 6 mmol/L (ref 5–14)
BLOOD UREA NITROGEN: 24 mg/dL — ABNORMAL HIGH (ref 9–23)
BUN / CREAT RATIO: 13
CALCIUM: 10.5 mg/dL — ABNORMAL HIGH (ref 8.7–10.4)
CHLORIDE: 112 mmol/L — ABNORMAL HIGH (ref 98–107)
CO2: 23.7 mmol/L (ref 20.0–31.0)
CREATININE: 1.84 mg/dL — ABNORMAL HIGH
EGFR CKD-EPI (2021) MALE: 39 mL/min/{1.73_m2} — ABNORMAL LOW (ref >=60)
GLUCOSE RANDOM: 96 mg/dL (ref 70–179)
PHOSPHORUS: 3.1 mg/dL (ref 2.4–5.1)
POTASSIUM: 4.7 mmol/L (ref 3.4–4.8)
SODIUM: 142 mmol/L (ref 135–145)

## 2022-05-08 LAB — CBC W/ AUTO DIFF
BASOPHILS ABSOLUTE COUNT: 0.1 10*9/L (ref 0.0–0.1)
BASOPHILS RELATIVE PERCENT: 2.2 %
EOSINOPHILS ABSOLUTE COUNT: 0.2 10*9/L (ref 0.0–0.5)
EOSINOPHILS RELATIVE PERCENT: 5.9 %
HEMATOCRIT: 38.1 % — ABNORMAL LOW (ref 39.0–48.0)
HEMOGLOBIN: 12.2 g/dL — ABNORMAL LOW (ref 12.9–16.5)
LYMPHOCYTES ABSOLUTE COUNT: 1.2 10*9/L (ref 1.1–3.6)
LYMPHOCYTES RELATIVE PERCENT: 30.8 %
MEAN CORPUSCULAR HEMOGLOBIN CONC: 31.9 g/dL — ABNORMAL LOW (ref 32.0–36.0)
MEAN CORPUSCULAR HEMOGLOBIN: 29.8 pg (ref 25.9–32.4)
MEAN CORPUSCULAR VOLUME: 93.4 fL (ref 77.6–95.7)
MEAN PLATELET VOLUME: 8 fL (ref 6.8–10.7)
MONOCYTES ABSOLUTE COUNT: 0.7 10*9/L (ref 0.3–0.8)
MONOCYTES RELATIVE PERCENT: 19.1 %
NEUTROPHILS ABSOLUTE COUNT: 1.6 10*9/L — ABNORMAL LOW (ref 1.8–7.8)
NEUTROPHILS RELATIVE PERCENT: 42 %
NUCLEATED RED BLOOD CELLS: 0 /100{WBCs} (ref <=4)
PLATELET COUNT: 187 10*9/L (ref 150–450)
RED BLOOD CELL COUNT: 4.08 10*12/L — ABNORMAL LOW (ref 4.26–5.60)
RED CELL DISTRIBUTION WIDTH: 15.9 % — ABNORMAL HIGH (ref 12.2–15.2)
WBC ADJUSTED: 3.8 10*9/L (ref 3.6–11.2)

## 2022-05-08 LAB — MAGNESIUM: MAGNESIUM: 1.4 mg/dL — ABNORMAL LOW (ref 1.6–2.6)

## 2022-05-08 LAB — TACROLIMUS LEVEL, TROUGH: TACROLIMUS, TROUGH: 6.5 ng/mL (ref 5.0–15.0)

## 2022-05-08 NOTE — Unmapped (Signed)
[facility] Refill Coordination Note    Specialty Medication(s) to be Shipped:   Transplant: Envarsus XR 4 mg and mycophenolate mofetil 180 mg    Other medication(s) to be shipped:  atorvastatin 40mg      Frank Leach, DOB: 10/20/1950  Phone: [phone] (home)       All above HIPAA information was verified with patient.     Was a Nurse, learning disability used for this call? No    Completed refill call assessment today to schedule patient's medication shipment from the [facility] [phone]).  All relevant notes have been reviewed.     Specialty medication(s) and dose(s) confirmed: Regimen is correct and unchanged.   Changes to medications: Frank Leach reports no changes at this time.  Changes to insurance: No  New side effects reported not previously addressed with a pharmacist or physician: None reported  Questions for the pharmacist: No    Confirmed patient received a Conservation officer, historic buildings and a Surveyor, mining with first shipment. The patient will receive a drug information handout for each medication shipped and additional FDA Medication Guides as required.       DISEASE/MEDICATION-SPECIFIC INFORMATION        N/A    SPECIALTY MEDICATION ADHERENCE     Medication Adherence    Patient reported X missed doses in the last month: 0  Specialty Medication: ENVARSUS XR 4 mg Tb24 extended release tablet (tacrolimus)  Patient is on additional specialty medications: Yes  Additional Specialty Medications: mycophenolate 180 MG EC tablet (MYFORTIC)  Patient Reported Additional Medication X Missed Doses in the Last Month: 0  Patient is on more than two specialty medications: Yes  Specialty Medication: ENVARSUS XR 1 mg Tb24 extended release tablet (tacrolimus)  Patient Reported Additional Medication X Missed Doses in the Last Month: 0  Informant: patient  Reliability of informant: reliable  Provider-estimated medication adherence level: good  Reasons for non-adherence: no problems identified                                Were doses missed due to medication being on hold? No    ENVARSUS XR 4 mg Tb24 extended release tablet (tacrolimus)  : 7 days of medicine on hand   ENVARSUS XR 1 mg Tb24 extended release tablet (tacrolimus)  : 7 days of medicine on hand   mycophenolate 180 MG EC tablet (MYFORTIC)  : 7 days of medicine on hand        REFERRAL TO PHARMACIST     Referral to the pharmacist: Not needed      [name]     Shipping address confirmed in Epic.     Delivery Scheduled: Yes, Expected medication delivery date: 05/12/22.     Medication will be delivered via Next Day Courier to the prescription address in Epic WAM.    Frank Leach' W Frank Leach [facility] Specialty Technician

## 2022-05-09 LAB — CMV DNA, QUANTITATIVE, PCR
CMV QUANT: <50 [IU]/mL — ABNORMAL HIGH (ref <0)
CMV VIRAL LD: DETECTED — AB

## 2022-05-11 MED FILL — ENVARSUS XR 4 MG TABLET,EXTENDED RELEASE: ORAL | 30 days supply | Qty: 30 | Fill #3

## 2022-05-11 MED FILL — MYCOPHENOLATE SODIUM 180 MG TABLET,DELAYED RELEASE: ORAL | 45 days supply | Qty: 180 | Fill #1

## 2022-05-11 MED FILL — ATORVASTATIN 40 MG TABLET: ORAL | 90 days supply | Qty: 90 | Fill #3

## 2022-05-23 MED ORDER — MG-PLUS-PROTEIN 133 MG TABLET
ORAL_TABLET | Freq: Two times a day (BID) | ORAL | 11 refills | 50 days | Status: CP
Start: 2022-05-23 — End: ?
  Filled 2022-05-24: qty 100, 50d supply, fill #0

## 2022-05-24 MED FILL — JARDIANCE 25 MG TABLET: ORAL | 30 days supply | Qty: 30 | Fill #7

## 2022-05-31 DIAGNOSIS — Z794 Long term (current) use of insulin: Principal | ICD-10-CM

## 2022-05-31 DIAGNOSIS — Z79899 Other long term (current) drug therapy: Principal | ICD-10-CM

## 2022-05-31 DIAGNOSIS — Z940 Kidney transplant status: Principal | ICD-10-CM

## 2022-05-31 DIAGNOSIS — E0801 Diabetes mellitus due to underlying condition with hyperosmolarity with coma: Principal | ICD-10-CM

## 2022-06-02 ENCOUNTER — Ambulatory Visit: Admit: 2022-06-02 | Discharge: 2022-06-03 | Payer: MEDICARE

## 2022-06-02 ENCOUNTER — Encounter: Admit: 2022-06-02 | Discharge: 2022-06-03 | Payer: MEDICARE | Attending: Nephrology | Primary: Nephrology

## 2022-06-02 ENCOUNTER — Ambulatory Visit: Admit: 2022-06-02 | Discharge: 2022-06-03 | Payer: MEDICARE | Attending: Nephrology | Primary: Nephrology

## 2022-06-02 DIAGNOSIS — Z940 Kidney transplant status: Principal | ICD-10-CM

## 2022-06-02 DIAGNOSIS — Z79899 Other long term (current) drug therapy: Principal | ICD-10-CM

## 2022-06-02 DIAGNOSIS — I429 Cardiomyopathy, unspecified: Principal | ICD-10-CM

## 2022-06-02 DIAGNOSIS — E0801 Diabetes mellitus due to underlying condition with hyperosmolarity with coma: Principal | ICD-10-CM

## 2022-06-02 DIAGNOSIS — Z794 Long term (current) use of insulin: Principal | ICD-10-CM

## 2022-06-02 LAB — PROTEIN / CREATININE RATIO, URINE
CREATININE, URINE: 75.4 mg/dL
PROTEIN URINE: 23.6 mg/dL
PROTEIN/CREAT RATIO, URINE: 0.313

## 2022-06-02 LAB — BASIC METABOLIC PANEL
ANION GAP: 7 mmol/L (ref 5–14)
BLOOD UREA NITROGEN: 27 mg/dL — ABNORMAL HIGH (ref 9–23)
BUN / CREAT RATIO: 15
CALCIUM: 11 mg/dL — ABNORMAL HIGH (ref 8.7–10.4)
CHLORIDE: 112 mmol/L — ABNORMAL HIGH (ref 98–107)
CO2: 24.2 mmol/L (ref 20.0–31.0)
CREATININE: 1.79 mg/dL — ABNORMAL HIGH
EGFR CKD-EPI (2021) MALE: 41 mL/min/{1.73_m2} — ABNORMAL LOW (ref >=60)
GLUCOSE RANDOM: 104 mg/dL (ref 70–179)
POTASSIUM: 5.1 mmol/L — ABNORMAL HIGH (ref 3.4–4.8)
SODIUM: 143 mmol/L (ref 135–145)

## 2022-06-02 LAB — HEPATIC FUNCTION PANEL
ALBUMIN: 4.3 g/dL (ref 3.4–5.0)
ALKALINE PHOSPHATASE: 85 U/L (ref 46–116)
ALT (SGPT): 9 U/L — ABNORMAL LOW (ref 10–49)
AST (SGOT): 14 U/L (ref <=34)
BILIRUBIN DIRECT: 0.3 mg/dL (ref 0.00–0.30)
BILIRUBIN TOTAL: 0.6 mg/dL (ref 0.3–1.2)
PROTEIN TOTAL: 7.2 g/dL (ref 5.7–8.2)

## 2022-06-02 LAB — URINALYSIS WITH MICROSCOPY
BILIRUBIN UA: NEGATIVE
BLOOD UA: NEGATIVE
GLUCOSE UA: >1000 — AB
HYALINE CASTS: <1 /LPF (ref 0–1)
KETONES UA: NEGATIVE
LEUKOCYTE ESTERASE UA: NEGATIVE
NITRITE UA: NEGATIVE
PH UA: 6 (ref 5.0–9.0)
PROTEIN UA: NEGATIVE
RBC UA: <1 /HPF (ref <3)
SPECIFIC GRAVITY UA: 1.015 (ref 1.005–1.030)
SQUAMOUS EPITHELIAL: <1 /HPF (ref 0–5)
UROBILINOGEN UA: 0.2
WBC UA: 1 /HPF (ref <2)

## 2022-06-02 LAB — CBC W/ AUTO DIFF
BASOPHILS ABSOLUTE COUNT: 0.1 10*9/L (ref 0.0–0.1)
BASOPHILS RELATIVE PERCENT: 2.4 %
EOSINOPHILS ABSOLUTE COUNT: 0.2 10*9/L (ref 0.0–0.5)
EOSINOPHILS RELATIVE PERCENT: 4.7 %
HEMATOCRIT: 40.6 % (ref 39.0–48.0)
HEMOGLOBIN: 13.1 g/dL (ref 12.9–16.5)
LYMPHOCYTES ABSOLUTE COUNT: 1.1 10*9/L (ref 1.1–3.6)
LYMPHOCYTES RELATIVE PERCENT: 24.1 %
MEAN CORPUSCULAR HEMOGLOBIN CONC: 32.2 g/dL (ref 32.0–36.0)
MEAN CORPUSCULAR HEMOGLOBIN: 30.2 pg (ref 25.9–32.4)
MEAN CORPUSCULAR VOLUME: 93.7 fL (ref 77.6–95.7)
MEAN PLATELET VOLUME: 8.5 fL (ref 6.8–10.7)
MONOCYTES ABSOLUTE COUNT: 0.7 10*9/L (ref 0.3–0.8)
MONOCYTES RELATIVE PERCENT: 15.9 %
NEUTROPHILS ABSOLUTE COUNT: 2.4 10*9/L (ref 1.8–7.8)
NEUTROPHILS RELATIVE PERCENT: 52.9 %
NUCLEATED RED BLOOD CELLS: 0 /100{WBCs} (ref <=4)
PLATELET COUNT: 221 10*9/L (ref 150–450)
RED BLOOD CELL COUNT: 4.33 10*12/L (ref 4.26–5.60)
RED CELL DISTRIBUTION WIDTH: 15.9 % — ABNORMAL HIGH (ref 12.2–15.2)
WBC ADJUSTED: 4.6 10*9/L (ref 3.6–11.2)

## 2022-06-02 LAB — HEMOGLOBIN A1C
ESTIMATED AVERAGE GLUCOSE: 117 mg/dL
HEMOGLOBIN A1C: 5.7 % — ABNORMAL HIGH (ref 4.8–5.6)

## 2022-06-02 LAB — TACROLIMUS LEVEL, TROUGH: TACROLIMUS, TROUGH: 6.8 ng/mL (ref 5.0–15.0)

## 2022-06-02 LAB — MAGNESIUM: MAGNESIUM: 1.6 mg/dL (ref 1.6–2.6)

## 2022-06-02 LAB — PHOSPHORUS: PHOSPHORUS: 2.7 mg/dL (ref 2.4–5.1)

## 2022-06-02 NOTE — Unmapped (Signed)
AOBP:Right  arm  Medium cuff   Average:190/68  Pulse:56  1st reading:195/69  Pulse:57  2nd reading:181/73  Pulse:57  3rd reading:194/63  Pulse:55

## 2022-06-05 LAB — CMV DNA, QUANTITATIVE, PCR
CMV QUANT: <35 [IU]/mL — ABNORMAL HIGH (ref <0)
CMV VIRAL LD: DETECTED — AB

## 2022-06-05 NOTE — Unmapped (Signed)
[facility] Refill Coordination Note    Specialty Medication(s) to be Shipped:   Transplant: Envarsus XR 4mg  and Envarsus XR 1mg  declined    Other medication(s) to be shipped: No additional medications requested for fill at this time     Frank Leach, DOB: [birth year]  Phone: [phone] (home)       All above HIPAA information was verified with patient.     Was a Nurse, learning disability used for this call? No    Completed refill call assessment today to schedule patient's medication shipment from the [facility] [phone]).  All relevant notes have been reviewed.     Specialty medication(s) and dose(s) confirmed: Regimen is correct and unchanged.   Changes to medications: Duey reports no changes at this time.  Changes to insurance: No  New side effects reported not previously addressed with a pharmacist or physician: None reported  Questions for the pharmacist: No    Confirmed patient received a Conservation officer, historic buildings and a Surveyor, mining with first shipment. The patient will receive a drug information handout for each medication shipped and additional FDA Medication Guides as required.       DISEASE/MEDICATION-SPECIFIC INFORMATION        N/A    SPECIALTY MEDICATION ADHERENCE     Medication Adherence    Patient reported X missed doses in the last month: 0  Specialty Medication: ENVARSUS XR 4 mg Tb24 extended release tablet (tacrolimus)  Patient is on additional specialty medications: Yes  Additional Specialty Medications: ENVARSUS XR 1 mg Tb24 extended release tablet (tacrolimus)  Patient Reported Additional Medication X Missed Doses in the Last Month: 0  Patient is on more than two specialty medications: No                          Were doses missed due to medication being on hold? No    Envarsus XR 4 mg: 7 days of medicine on hand        REFERRAL TO PHARMACIST     Referral to the pharmacist: Not needed      [facility]     Shipping address confirmed in Epic.     Delivery Scheduled: Yes, Expected medication delivery date: 06/08/22.     Medication will be delivered via Next Day Courier to the prescription address in Epic WAM.    Willette Pa   [facility] Specialty Technician

## 2022-06-06 LAB — BK VIRUS QUANTITATIVE PCR, BLOOD: BK BLOOD RESULT: NOT DETECTED

## 2022-06-07 MED FILL — ENVARSUS XR 4 MG TABLET,EXTENDED RELEASE: ORAL | 30 days supply | Qty: 30 | Fill #4

## 2022-06-13 NOTE — Unmapped (Signed)
Transplant Nephrology Clinic Visit      Date of visit: June 02 2022    Subjective/Interval:     Frank Leach is a 71 y.o. 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      Other PMH significant for diabetes (diet controlled pre transplant), gout, HTN     Today Frank Leach denies abdominal pain, fever, chills, nausea, diarrhea or any other complaints.      Has recovered from COVID he had in September: myfortic was then temporarily reduced and he completed a course of molnupiravir.        Assessment:  71 y.o. 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       Recommendations/Plan:     Allograft Function: DDKT KDPI: 42%Jul 20, 2019    Latest Cr 1.79  (baseline)      Renal function holding relatively stable with electrolytes and acid base balanced.   Creatinine 2.21 when Dr. Nestor Lewandowsky saw him was above his baseline  so he ordered a biopsy.      01/2022:  Kidney biopsy: mild inflammation with focal mild tubulitis.   Solumedrol 125 mg iv X 3 doses outpatient followed by rapid steroid taper  Keep myfortic at 360 bid ( low level CMV noted on labs)  Bactrim for PJP prophylaxis for 1 month  CMV D+/R+  Moderate risk: vaclyte 450 daily for 1 month    Baseline - 1.4-1.8  Decoy/ BK     neg Date:  05/27/21  DSA     neg Date:  05/27/21  CMV:    <52 Date:  01/13/22, continue to monitor his CMV.  UPC:     0.328 Date:  01/13/22.       I gave him 3 solumedrol pulses and a steroid taper.      Immunosuppression Management [High Risk Medical Decision Making For Drug Therapy Requiring Intensive Monitoring For Toxicity]:     Takes Envarsus at 9:00 a.m and is on 6 mg ( recent dose increase) and Myfortic 360 mg BID.  Tacro target: 4-6      CKD MBD:  Other  hypercalcemia:   1. Stopped Vit D last time    Check PTH    Repeat calcium in 2 weeks    Blood Pressure Management:  controlled: on olmesartan    Lipid Management:   7/23 at goal. Continue atorvastatin      Electrolytes: stable    RCC s/p bilateral nephrectomy:   CT RMP 08/29/2018 showed no suspicious enhancing lesions.   Repeat CT findings from 04/07/20 were overall stable.  Needs a CT chest abdomen pelvis non contrast now: set it up      D.M -well controlled.   Continue Jardiance + metformin.      Health Maintenance:   Colonoscopy: 04/30/2018 - 1 adenomatous polyp; repeat 5-10 years    Immunizations:     Immunization History   Administered Date(s) Administered    COVID-19 VAC,BIVALENT,MODERNA(BLUE CAP) 05/27/2021    COVID-19 VACCINE,MRNA(MODERNA)(PF) 09/12/2019, 10/14/2019, 04/07/2020, 10/20/2020    HEPATITIS B VACCINE ADULT,IM(ENERGIX B, RECOMBIVAX) 04/13/2012, 04/13/2012, 05/16/2012, 05/16/2012, 06/13/2012, 06/13/2012, 10/24/2012, 10/24/2012, 10/23/2013, 10/23/2013, 02/16/2016, 02/16/2016, 09/27/2016, 02/21/2017, 03/21/2017, 04/25/2017    HiB-PRP-T 04/24/2016    INFLUENZA QUAD HIGH DOSE 29YRS+(FLUZONE) 04/30/2019, 04/30/2019, 04/30/2019    INFLUENZA TIV (TRI) PF (IM) 06/15/2009    Influenza Vaccine Quad (IIV4 PF) 91mo+ injectable 05/08/2019,  05/19/2020, 06/02/2022    Influenza Virus Vaccine, unspecified formulation 05/07/2017    Meningococcal B Vaccine, OMV Adjuvanted(Bexsero) 07/24/2017    Meningococcal Conjugate MCV4P 04/24/2016, 07/24/2017    PNEUMOCOCCAL POLYSACCHARIDE 23-VALENT 10/21/2008, 10/31/2012, 10/31/2012, 04/24/2016    PPD Test 01/24/2012    Pneumococcal Conjugate 13-Valent 10/30/2013, 10/30/2013    SHINGRIX-ZOSTER VACCINE (HZV), RECOMBINANT,SUB-UNIT,ADJUVANTED IM 07/24/2017    TdaP 07/24/2017        Transplant History    Patient has ESRD secondary to HTN.  Patient has been on dialysis since 01/25/2012.  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 Patient was admitted for kidney transplant on 06/21/2019.      Transplant History:    Organ Received: Left DDKT, DBD, PHS, KDPI: 42%; 21h cold ischemia  Native Kidney Disease: HTN; cPRA: 0%  Date of Transplant: 06/22/2019  Post-Transplant Course: HD once for high potassium  Prior Transplants: none  Induction: [place]  Date of Ureteral Stent Removal: 07/29/2019  CMV/EBV Status: CMV D+/R+  EBV D+/R+  Rejection Episodes: none  Donor Specific Antibodies: none  Results of Renal Imaging (pre and post):     Pre-Txp 08/29/2018 CT RMP  Sequela of bilateral nephrectomy. Interval decrease in linear soft tissue within the right nephrectomy bed, potentially postsurgical or fat necrosis. Unchanged linear tissue within the left nephrectomy bed. No suspicious enhancing lesions are visualized within the surgical beds    Post-Txp 06/25/2019 (txp kidney only)  The renal transplant was located in the left lower quadrant. Normal size and echogenicity.  No solid masses or calculi. Trace perinephric fluid adjacent to the lower pole of the kidney. Mild pelviectasis  - Perfusion: Using power Doppler, normal perfusion was seen throughout the renal parenchyma.  - Resistive indices in the renal transplant are stable compared with prior examination.  - Main renal artery/iliac artery: Patent. Again noted are 3 renal arteries. Resistive indices within the renal arteries are stable to minimally increased, now at or just above normal limits.  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    Medications  Current Outpatient Medications   Medication Sig Dispense Refill    acetaminophen (TYLENOL) 500 MG tablet Take 1-2 tablets (500-1,000 mg total) by mouth every six (6) hours as needed for pain or fever (> 38C). 100 tablet 0    allopurinoL (ZYLOPRIM) 100 MG tablet Take 1 tablet (100 mg total) by mouth every evening. 90 tablet 3    atorvastatin (LIPITOR) 40 MG tablet Take 1 tablet (40 mg total) by mouth daily. 90 tablet 3    empagliflozin (JARDIANCE) 25 mg tablet Take 1 tablet (25 mg total) by mouth daily. 90 tablet 3    ENVARSUS XR 1 mg Tb24 extended release tablet Take 2 tablets (2 mg total) by mouth in the morning. Take 2 tablets ( 2 mg total) in addition to 4 mg tablet for a total dose of 6 mg daily. 60 tablet 11    ENVARSUS XR 4 mg Tb24 extended release tablet Take 1 tablet (4 mg total) by mouth daily. Take 1 tablet ( 4 mg total) in addition to 1 mg tablets for  Total daily dose of 6 mg 30 tablet 11    magnesium  oxide-Mg AA chelate (MAGNESIUM, AMINO ACID CHELATE,) 133 mg Take 1 tablet by mouth two (2) times a day. 100 tablet 11    metFORMIN (GLUCOPHAGE-XR) 500 MG 24 hr tablet Take 1 tablet (500 mg total) by mouth in the morning. 180 tablet 3    mycophenolate (MYFORTIC) 180 MG EC tablet Take 2 tablets (360 mg total) by mouth Two (2) times a day. 180 tablet 3    olmesartan (BENICAR) 40 MG tablet Take 1 tablet (40 mg total) by mouth daily. (Patient not taking: Reported on 02/24/2022) 90 tablet 3     No current facility-administered medications for this visit.     Physical Exam:   BP 190/68 (BP Site: R Arm, BP Position: Sitting, BP Cuff Size: Medium)  - Pulse 56  - Temp 36.2 ??C (97.1 ??F) (Temporal)  - Ht 170.2 cm (5' 7)  - Wt 67 kg (147 lb 9.6 oz)  - BMI 23.12 kg/m??     Nursing note and vitals reviewed.   Constitutional: Oriented to person, place, and time. Appears well-developed and well-nourished. No distress.   Head: Normocephalic and atraumatic.   Eyes: Right eye exhibits no discharge. Left eye exhibits no discharge. No scleral icterus.   Neck: Normal range of motion. Neck supple.   Cardiovascular: Normal rate and regular rhythm. Exam reveals no gallop and no friction rub. No murmur heard.   Pulmonary/Chest: Effort normal and breath sounds normal. No respiratory distress.   Abdominal: Soft. Non tender  Musculoskeletal: Normal range of motion. No edema and no tenderness.   Neurological: Alert and oriented to person, place, and time.   Skin: Skin is warm and dry.   Psychiatric: Normal mood and affect.       Laboratory Data and Imaging reviewed in EPIC    Follow-Up:in 4 months    Labs every 2 weeks for now.

## 2022-06-14 DIAGNOSIS — C649 Malignant neoplasm of unspecified kidney, except renal pelvis: Principal | ICD-10-CM

## 2022-06-14 DIAGNOSIS — C642 Malignant neoplasm of left kidney, except renal pelvis: Principal | ICD-10-CM

## 2022-06-14 NOTE — Unmapped (Signed)
Called pt to check in and review recent labs.  Pt reports he is not currently taking any vitamin D supplements.  Pt also advised he will need a repeat CT chest/abd per Urology.  Will send message to get scheduled at [facility] loccation.

## 2022-06-16 LAB — HLA DS POST TRANSPLANT
ANTI-DONOR DRW #1 MFI: 104 MFI
ANTI-DONOR HLA-A #1 MFI: 52 MFI
ANTI-DONOR HLA-A #2 MFI: 44 MFI
ANTI-DONOR HLA-B #1 MFI: 42 MFI
ANTI-DONOR HLA-B #2 MFI: 16 MFI
ANTI-DONOR HLA-C #1 MFI: 189 MFI
ANTI-DONOR HLA-C #2 MFI: 1 MFI
ANTI-DONOR HLA-DQB #1 MFI: 29 MFI
ANTI-DONOR HLA-DQB #2 MFI: 708 MFI
ANTI-DONOR HLA-DR #1 MFI: 54 MFI
ANTI-DONOR HLA-DR #2 MFI: 88 MFI

## 2022-06-16 LAB — FSAB CLASS 1 ANTIBODY SPECIFICITY: HLA CLASS 1 ANTIBODY RESULT: POSITIVE

## 2022-06-16 LAB — FSAB CLASS 2 ANTIBODY SPECIFICITY: HLA CL2 AB RESULT: POSITIVE

## 2022-06-22 NOTE — Unmapped (Signed)
[facility] Refill Coordination Note    Specialty Medication(s) to be Shipped:   Transplant: Envarsus 1mg  and mycophenolate mofetil 180mg     Other medication(s) to be shipped: jardiance, olmesartan     Frank Leach, DOB: [birth year]  Phone: [phone] (home)       All above HIPAA information was verified with patient.     Was a Nurse, learning disability used for this call? No    Completed refill call assessment today to schedule patient's medication shipment from the [facility] [phone]).  All relevant notes have been reviewed.     Specialty medication(s) and dose(s) confirmed: Regimen is correct and unchanged.   Changes to medications: Frank Leach reports no changes at this time.  Changes to insurance: No  New side effects reported not previously addressed with a pharmacist or physician: None reported  Questions for the pharmacist: No    Confirmed patient received a Conservation officer, historic buildings and a Surveyor, mining with first shipment. The patient will receive a drug information handout for each medication shipped and additional FDA Medication Guides as required.       DISEASE/MEDICATION-SPECIFIC INFORMATION        N/A    SPECIALTY MEDICATION ADHERENCE     Medication Adherence    Patient reported X missed doses in the last month: 0  Specialty Medication: mycophenolate 180 MG EC tablet (MYFORTIC)  Patient is on additional specialty medications: Yes  Additional Specialty Medications: Envarsus xr 1 mg  Patient Reported Additional Medication X Missed Doses in the Last Month: 0  Patient is on more than two specialty medications: No  Any gaps in refill history greater than 2 weeks in the last 3 months: no  Demonstrates understanding of importance of adherence: yes  Informant: patient  Reliability of informant: reliable              Confirmed plan for next specialty medication refill: delivery by pharmacy  Refills needed for supportive medications: not needed              Were doses missed due to medication being on hold? No    Envarsus 1 mg: Patient has 7 days of medication on hand   Mycophenolate 180 mg: Patient has 7 days of medication on hand     REFERRAL TO PHARMACIST     Referral to the pharmacist: Not needed      [facility]     Shipping address confirmed in Epic.     Delivery Scheduled: Yes, Expected medication delivery date: 11/17.     Medication will be delivered via UPS to the prescription address in Epic WAM.    Frank Leach   [facility] Specialty Technician

## 2022-06-23 MED FILL — JARDIANCE 25 MG TABLET: ORAL | 30 days supply | Qty: 30 | Fill #8

## 2022-06-23 MED FILL — ENVARSUS XR 1 MG TABLET,EXTENDED RELEASE: ORAL | 90 days supply | Qty: 180 | Fill #1

## 2022-06-23 MED FILL — MYCOPHENOLATE SODIUM 180 MG TABLET,DELAYED RELEASE: ORAL | 45 days supply | Qty: 180 | Fill #2

## 2022-06-23 MED FILL — OLMESARTAN 40 MG TABLET: ORAL | 90 days supply | Qty: 90 | Fill #3

## 2022-06-26 NOTE — Unmapped (Signed)
UNOS Form

## 2022-06-27 DIAGNOSIS — C642 Malignant neoplasm of left kidney, except renal pelvis: Principal | ICD-10-CM

## 2022-06-27 DIAGNOSIS — C649 Malignant neoplasm of unspecified kidney, except renal pelvis: Principal | ICD-10-CM

## 2022-06-28 ENCOUNTER — Ambulatory Visit: Admit: 2022-06-28 | Discharge: 2022-06-29 | Payer: MEDICARE

## 2022-06-28 DIAGNOSIS — N2889 Other specified disorders of kidney and ureter: Principal | ICD-10-CM

## 2022-06-28 LAB — RENAL FUNCTION PANEL
ALBUMIN: 4.2 g/dL (ref 3.4–5.0)
ANION GAP: 7 mmol/L (ref 5–14)
BLOOD UREA NITROGEN: 25 mg/dL — ABNORMAL HIGH (ref 9–23)
BUN / CREAT RATIO: 13
CALCIUM: 10.6 mg/dL — ABNORMAL HIGH (ref 8.7–10.4)
CHLORIDE: 114 mmol/L — ABNORMAL HIGH (ref 98–107)
CO2: 22.6 mmol/L (ref 20.0–31.0)
CREATININE: 1.94 mg/dL — ABNORMAL HIGH
EGFR CKD-EPI (2021) MALE: 37 mL/min/{1.73_m2} — ABNORMAL LOW (ref >=60)
GLUCOSE RANDOM: 95 mg/dL (ref 70–179)
PHOSPHORUS: 3.2 mg/dL (ref 2.4–5.1)
POTASSIUM: 4.9 mmol/L — ABNORMAL HIGH (ref 3.4–4.8)
SODIUM: 144 mmol/L (ref 135–145)

## 2022-06-28 LAB — CBC W/ AUTO DIFF
BASOPHILS ABSOLUTE COUNT: 0.1 10*9/L (ref 0.0–0.1)
BASOPHILS RELATIVE PERCENT: 1.6 %
EOSINOPHILS ABSOLUTE COUNT: 0.2 10*9/L (ref 0.0–0.5)
EOSINOPHILS RELATIVE PERCENT: 4.5 %
HEMATOCRIT: 40.7 % (ref 39.0–48.0)
HEMOGLOBIN: 12.8 g/dL — ABNORMAL LOW (ref 12.9–16.5)
LYMPHOCYTES ABSOLUTE COUNT: 1 10*9/L — ABNORMAL LOW (ref 1.1–3.6)
LYMPHOCYTES RELATIVE PERCENT: 24.3 %
MEAN CORPUSCULAR HEMOGLOBIN CONC: 31.5 g/dL — ABNORMAL LOW (ref 32.0–36.0)
MEAN CORPUSCULAR HEMOGLOBIN: 29.3 pg (ref 25.9–32.4)
MEAN CORPUSCULAR VOLUME: 93.1 fL (ref 77.6–95.7)
MEAN PLATELET VOLUME: 7.5 fL (ref 6.8–10.7)
MONOCYTES ABSOLUTE COUNT: 0.7 10*9/L (ref 0.3–0.8)
MONOCYTES RELATIVE PERCENT: 17.2 %
NEUTROPHILS ABSOLUTE COUNT: 2.1 10*9/L (ref 1.8–7.8)
NEUTROPHILS RELATIVE PERCENT: 52.4 %
NUCLEATED RED BLOOD CELLS: 0 /100{WBCs} (ref <=4)
PLATELET COUNT: 222 10*9/L (ref 150–450)
RED BLOOD CELL COUNT: 4.37 10*12/L (ref 4.26–5.60)
RED CELL DISTRIBUTION WIDTH: 15.6 % — ABNORMAL HIGH (ref 12.2–15.2)
WBC ADJUSTED: 4.1 10*9/L (ref 3.6–11.2)

## 2022-06-28 LAB — MAGNESIUM: MAGNESIUM: 1.7 mg/dL (ref 1.6–2.6)

## 2022-06-28 LAB — TACROLIMUS LEVEL, TROUGH: TACROLIMUS, TROUGH: 6.2 ng/mL (ref 5.0–15.0)

## 2022-06-29 LAB — CMV DNA, QUANTITATIVE, PCR
CMV QUANT: <35 [IU]/mL — ABNORMAL HIGH (ref <0)
CMV VIRAL LD: DETECTED — AB

## 2022-07-07 DIAGNOSIS — Z940 Kidney transplant status: Principal | ICD-10-CM

## 2022-07-07 DIAGNOSIS — M109 Gout, unspecified: Principal | ICD-10-CM

## 2022-07-07 MED ORDER — ALLOPURINOL 100 MG TABLET
ORAL_TABLET | Freq: Every evening | ORAL | 3 refills | 90 days | Status: CP
Start: 2022-07-07 — End: ?
  Filled 2022-07-10: qty 90, 90d supply, fill #0

## 2022-07-07 NOTE — Unmapped (Signed)
[facility] Refill Coordination Note    Specialty Medication(s) to be Shipped:   Transplant: Envarsus 4mg     Other medication(s) to be shipped:  allopurinol 100mg      Frank Leach, DOB: 12/23/1950  Phone: [phone] (home)       All above HIPAA information was verified with patient.     Was a Nurse, learning disability used for this call? No    Completed refill call assessment today to schedule patient's medication shipment from the [facility] [phone]).  All relevant notes have been reviewed.     Specialty medication(s) and dose(s) confirmed: Regimen is correct and unchanged.   Changes to medications: Deven reports no changes at this time.  Changes to insurance: No  New side effects reported not previously addressed with a pharmacist or physician: None reported  Questions for the pharmacist: No    Confirmed patient received a Conservation officer, historic buildings and a Surveyor, mining with first shipment. The patient will receive a drug information handout for each medication shipped and additional FDA Medication Guides as required.       DISEASE/MEDICATION-SPECIFIC INFORMATION        N/A    SPECIALTY MEDICATION ADHERENCE     Medication Adherence    Patient reported X missed doses in the last month: 0  Specialty Medication: ENVARSUS XR 4 mg Tb24 extended release tablet (tacrolimus)  Patient is on additional specialty medications: No  Patient is on more than two specialty medications: No                                Were doses missed due to medication being on hold? No    Envarsus 4mg : Patient has 5 days of medication on hand     REFERRAL TO PHARMACIST     Referral to the pharmacist: Not needed      [facility]     Shipping address confirmed in Epic.     Delivery Scheduled: Yes, Expected medication delivery date: 07/11/22.     Medication will be delivered via UPS to the prescription address in Epic WAM.    Frank Leach   [facility] Specialty Technician

## 2022-07-10 ENCOUNTER — Ambulatory Visit: Admit: 2022-07-10 | Discharge: 2022-07-11 | Payer: MEDICARE

## 2022-07-10 LAB — RENAL FUNCTION PANEL
ALBUMIN: 4.3 g/dL (ref 3.4–5.0)
ANION GAP: 5 mmol/L (ref 5–14)
BLOOD UREA NITROGEN: 30 mg/dL — ABNORMAL HIGH (ref 9–23)
BUN / CREAT RATIO: 16
CALCIUM: 10.7 mg/dL — ABNORMAL HIGH (ref 8.7–10.4)
CHLORIDE: 115 mmol/L — ABNORMAL HIGH (ref 98–107)
CO2: 22.7 mmol/L (ref 20.0–31.0)
CREATININE: 1.84 mg/dL — ABNORMAL HIGH
EGFR CKD-EPI (2021) MALE: 39 mL/min/{1.73_m2} — ABNORMAL LOW (ref >=60)
GLUCOSE RANDOM: 96 mg/dL (ref 70–179)
PHOSPHORUS: 3.6 mg/dL (ref 2.4–5.1)
POTASSIUM: 5.3 mmol/L — ABNORMAL HIGH (ref 3.4–4.8)
SODIUM: 143 mmol/L (ref 135–145)

## 2022-07-10 LAB — CBC W/ AUTO DIFF
BASOPHILS ABSOLUTE COUNT: 0.1 10*9/L (ref 0.0–0.1)
BASOPHILS RELATIVE PERCENT: 1.5 %
EOSINOPHILS ABSOLUTE COUNT: 0.2 10*9/L (ref 0.0–0.5)
EOSINOPHILS RELATIVE PERCENT: 4.8 %
HEMATOCRIT: 40.7 % (ref 39.0–48.0)
HEMOGLOBIN: 12.7 g/dL — ABNORMAL LOW (ref 12.9–16.5)
LYMPHOCYTES ABSOLUTE COUNT: 1.2 10*9/L (ref 1.1–3.6)
LYMPHOCYTES RELATIVE PERCENT: 28.9 %
MEAN CORPUSCULAR HEMOGLOBIN CONC: 31.1 g/dL — ABNORMAL LOW (ref 32.0–36.0)
MEAN CORPUSCULAR HEMOGLOBIN: 29 pg (ref 25.9–32.4)
MEAN CORPUSCULAR VOLUME: 93 fL (ref 77.6–95.7)
MEAN PLATELET VOLUME: 8.2 fL (ref 6.8–10.7)
MONOCYTES ABSOLUTE COUNT: 0.8 10*9/L (ref 0.3–0.8)
MONOCYTES RELATIVE PERCENT: 17.9 %
NEUTROPHILS ABSOLUTE COUNT: 2 10*9/L (ref 1.8–7.8)
NEUTROPHILS RELATIVE PERCENT: 46.9 %
NUCLEATED RED BLOOD CELLS: 0 /100{WBCs} (ref <=4)
PLATELET COUNT: 205 10*9/L (ref 150–450)
RED BLOOD CELL COUNT: 4.38 10*12/L (ref 4.26–5.60)
RED CELL DISTRIBUTION WIDTH: 15.4 % — ABNORMAL HIGH (ref 12.2–15.2)
WBC ADJUSTED: 4.3 10*9/L (ref 3.6–11.2)

## 2022-07-10 LAB — TACROLIMUS LEVEL, TROUGH: TACROLIMUS, TROUGH: 6.2 ng/mL (ref 5.0–15.0)

## 2022-07-10 LAB — MAGNESIUM: MAGNESIUM: 1.6 mg/dL (ref 1.6–2.6)

## 2022-07-10 MED FILL — ENVARSUS XR 4 MG TABLET,EXTENDED RELEASE: ORAL | 30 days supply | Qty: 30 | Fill #5

## 2022-07-11 LAB — CMV DNA, QUANTITATIVE, PCR
CMV QUANT: <35 [IU]/mL — ABNORMAL HIGH (ref <0)
CMV VIRAL LD: DETECTED — AB

## 2022-07-14 MED FILL — MG-PLUS-PROTEIN 133 MG TABLET: ORAL | 50 days supply | Qty: 100 | Fill #1

## 2022-07-19 NOTE — Unmapped (Addendum)
[facility] Clinical Assessment & Refill Coordination Note    Frank Leach, [place]: 1950-10-24  Phone: [phone] (home)     All above HIPAA information was verified with patient.     Was a Nurse, learning disability used for this call? No    Specialty Medication(s):   Transplant: Envarsus 1mg , Envarsus 4mg  and  mycophenolic acid 180mg      Current Outpatient Medications   Medication Sig Dispense Refill   ??? acetaminophen (TYLENOL) 500 MG tablet Take 1-2 tablets (500-1,000 mg total) by mouth every six (6) hours as needed for pain or fever (> 38C). 100 tablet 0   ??? allopurinoL (ZYLOPRIM) 100 MG tablet Take 1 tablet (100 mg total) by mouth every evening. 90 tablet 3   ??? atorvastatin (LIPITOR) 40 MG tablet Take 1 tablet (40 mg total) by mouth daily. 90 tablet 3   ??? empagliflozin (JARDIANCE) 25 mg tablet Take 1 tablet (25 mg total) by mouth daily. 90 tablet 3   ??? ENVARSUS XR 1 mg Tb24 extended release tablet Take 2 tablets (2 mg total) by mouth in the morning. Take 2 tablets ( 2 mg total) in addition to 4 mg tablet for a total dose of 6 mg daily. 60 tablet 11   ??? ENVARSUS XR 4 mg Tb24 extended release tablet Take 1 tablet (4 mg total) by mouth daily. Take 1 tablet ( 4 mg total) in addition to 1 mg tablets for  Total daily dose of 6 mg 30 tablet 11   ??? magnesium oxide-Mg AA chelate (MAGNESIUM, AMINO ACID CHELATE,) 133 mg Take 1 tablet by mouth two (2) times a day. 100 tablet 11   ??? metFORMIN (GLUCOPHAGE-XR) 500 MG 24 hr tablet Take 1 tablet (500 mg total) by mouth in the morning. 180 tablet 3   ??? mycophenolate (MYFORTIC) 180 MG EC tablet Take 2 tablets (360 mg total) by mouth Two (2) times a day. 180 tablet 3   ??? olmesartan (BENICAR) 40 MG tablet Take 1 tablet (40 mg total) by mouth daily. (Patient not taking: Reported on 02/24/2022) 90 tablet 3     No current facility-administered medications for this visit.        Changes to medications: Lyndel reports no changes at this time.    No Known Allergies    Changes to allergies: No    SPECIALTY MEDICATION ADHERENCE     envarsus 1mg   : 25 days of medicine on hand   envarsus 4mg   : 25 days of medicine on hand   Mycophenolate 180mg   : 21 days of medicine on hand       Medication Adherence    Patient reported X missed doses in the last month: 0  Specialty Medication: envarsus 1mg   Patient is on additional specialty medications: Yes  Additional Specialty Medications: Envarsus 4mg   Patient Reported Additional Medication X Missed Doses in the Last Month: 0  Patient is on more than two specialty medications: Yes  Specialty Medication: mycophenolate 180mg   Patient Reported Additional Medication X Missed Doses in the Last Month: 0                      Specialty medication(s) dose(s) confirmed: Regimen is correct and unchanged.     Are there any concerns with adherence? No    Adherence counseling provided? Not needed    CLINICAL MANAGEMENT AND INTERVENTION      Clinical Benefit Assessment:    Do you feel the medicine is  effective or helping your condition? Yes    Clinical Benefit counseling provided? Not needed    Adverse Effects Assessment:    Are you experiencing any side effects? No    Are you experiencing difficulty administering your medicine? No    Quality of Life Assessment:    Quality of Life    Rheumatology  Oncology  Dermatology  Cystic Fibrosis          How many days over the past month did your transplant  keep you from your normal activities? For example, brushing your teeth or getting up in the morning. 0    Have you discussed this with your provider? Not needed    Acute Infection Status:    Acute infections noted within Epic:  No active infections  Patient reported infection: None    Therapy Appropriateness:    Is therapy appropriate and patient progressing towards therapeutic goals? Yes, therapy is appropriate and should be continued    DISEASE/MEDICATION-SPECIFIC INFORMATION      N/A    Solid Organ Transplant: Not Applicable    PATIENT SPECIFIC NEEDS - Does the patient have any physical, cognitive, or cultural barriers? No    - Is the patient high risk? Yes, patient is taking a REMS drug. Medication is dispensed in compliance with REMS program    - Did the patient require a clinical intervention? No    - Does the patient require physician intervention or other additional services (i.e., nutrition, smoking cessation, social work)? No    SOCIAL DETERMINANTS OF HEALTH     At the [facility], we have learned that life circumstances - like trouble affording food, housing, utilities, or transportation can affect the health of many of our patients.   That is why we wanted to ask: are you currently experiencing any life circumstances that are negatively impacting your health and/or quality of life? No    Social Determinants of Psychologist, prison and probation services Strain: Not on file   Internet Connectivity: Not on file   Food Insecurity: No Food Insecurity (06/23/2019)    Hunger Vital Sign    ??? Worried About Running Out of Food in the Last Year: Never true    ??? Ran Out of Food in the Last Year: Never true   Tobacco Use: Low Risk  (02/08/2022)    Patient History    ??? Smoking Tobacco Use: Never    ??? Smokeless Tobacco Use: Never    ??? Passive Exposure: Not on file   Housing/Utilities: Unknown (11/13/2020)    Housing/Utilities    ??? Within the past 12 months, have you ever stayed: outside, in a car, in a tent, in an overnight shelter, or temporarily in someone else's home (i.e. couch-surfing)?: No    ??? Are you worried about losing your housing?: Not on file    ??? Within the past 12 months, have you been unable to get utilities (heat, electricity) when it was really needed?: Not on file   Alcohol Use: Not on file   Transportation Needs: Not on file   Substance Use: Not on file   Health Literacy: Low Risk  (11/13/2020)    Health Literacy    ??? : Never   Physical Activity: Not on file   Interpersonal Safety: Not on file   Stress: Not on file   Intimate Partner Violence: Not on file Depression: Not on file   Social Connections: Not on file       Would you  be willing to receive help with any of the needs that you have identified today? Not applicable       SHIPPING     Specialty Medication(s) to be Shipped:   n/a    Other medication(s) to be shipped: jardiance   Pt would like call back in 2 weeks for all other medications     Changes to insurance: No    Delivery Scheduled: Yes, Expected medication delivery date: 07/25/2022.     Medication will be delivered via UPS to the confirmed prescription address in [facility].    The patient will receive a drug information handout for each medication shipped and additional FDA Medication Guides as required.  Verified that patient has previously received a Conservation officer, historic buildings and a Surveyor, mining.    The patient or caregiver noted above participated in the development of this care plan and knows that they can request review of or adjustments to the care plan at any time.      All of the patient's questions and concerns have been addressed.    Thad Ranger, PharmD   [facility] Specialty Pharmacist

## 2022-07-24 MED FILL — JARDIANCE 25 MG TABLET: ORAL | 30 days supply | Qty: 30 | Fill #9

## 2022-08-03 MED ORDER — ATORVASTATIN 40 MG TABLET
ORAL_TABLET | Freq: Every day | ORAL | 3 refills | 90.00000 days
Start: 2022-08-03 — End: 2023-08-03

## 2022-08-03 NOTE — Unmapped (Signed)
[facility] Refill Coordination Note    Specialty Medication(s) to be Shipped:   Transplant: Envarsus 4mg  and mycophenolate mofetil 180 EC mg    Other medication(s) to be shipped:  atorvastatin [address] DOB: 1951-07-29  Phone: [phone] (home)       All above HIPAA information was verified with patient.     Was a Nurse, learning disability used for this call? No    Completed refill call assessment today to schedule patient's medication shipment from the [facility] [phone]).  All relevant notes have been reviewed.     Specialty medication(s) and dose(s) confirmed: Regimen is correct and unchanged.   Changes to medications: Kingsten reports no changes at this time.  Changes to insurance: Yes: [employer] coverage  New side effects reported not previously addressed with a pharmacist or physician: None reported  Questions for the pharmacist: No    Confirmed patient received a Conservation officer, historic buildings and a Surveyor, mining with first shipment. The patient will receive a drug information handout for each medication shipped and additional FDA Medication Guides as required.       DISEASE/MEDICATION-SPECIFIC INFORMATION        N/A    SPECIALTY MEDICATION ADHERENCE     Medication Adherence    Specialty Medication: ENVARSUS XR 4 mg Tb24 extended release tablet (tacrolimus)  Patient is on additional specialty medications: Yes  Additional Specialty Medications: mycophenolate 180 MG EC tablet (MYFORTIC)  Patient is on more than two specialty medications: No                                Were doses missed due to medication being on hold? No    mycophenolate 180 MG EC tablet (MYFORTIC)  : 7 days of medicine on hand   ENVARSUS XR 4 mg Tb24 extended release tablet (tacrolimus)  : 5 days of medicine on hand   ENVARSUS XR 1 mg Tb24 extended release tablet (tacrolimus)  : 45 days of medicine on hand    REFERRAL TO PHARMACIST     Referral to the pharmacist: Not needed      [facility] Shipping address confirmed in Epic.     Delivery Scheduled: Yes, Expected medication delivery date: 08/09/22.     Medication will be delivered via UPS to the prescription address in Epic WAM.    Otelia Hettinger' [facility] Specialty Technician

## 2022-08-08 ENCOUNTER — Ambulatory Visit: Admit: 2022-08-08 | Discharge: 2022-08-09 | Payer: MEDICARE

## 2022-08-08 LAB — RENAL FUNCTION PANEL
ALBUMIN: 4.1 g/dL (ref 3.4–5.0)
ANION GAP: 6 mmol/L (ref 5–14)
BLOOD UREA NITROGEN: 24 mg/dL — ABNORMAL HIGH (ref 9–23)
BUN / CREAT RATIO: 14
CALCIUM: 10.4 mg/dL (ref 8.7–10.4)
CHLORIDE: 114 mmol/L — ABNORMAL HIGH (ref 98–107)
CO2: 24.3 mmol/L (ref 20.0–31.0)
CREATININE: 1.76 mg/dL — ABNORMAL HIGH
EGFR CKD-EPI (2021) MALE: 41 mL/min/{1.73_m2} — ABNORMAL LOW (ref >=60)
GLUCOSE RANDOM: 92 mg/dL (ref 70–179)
PHOSPHORUS: 2.7 mg/dL (ref 2.4–5.1)
POTASSIUM: 4.5 mmol/L (ref 3.4–4.8)
SODIUM: 144 mmol/L (ref 135–145)

## 2022-08-08 LAB — CBC W/ AUTO DIFF
BASOPHILS ABSOLUTE COUNT: 0 10*9/L (ref 0.0–0.1)
BASOPHILS RELATIVE PERCENT: 1.1 %
EOSINOPHILS ABSOLUTE COUNT: 0.3 10*9/L (ref 0.0–0.5)
EOSINOPHILS RELATIVE PERCENT: 8.5 %
HEMATOCRIT: 39.4 % (ref 39.0–48.0)
HEMOGLOBIN: 12.3 g/dL — ABNORMAL LOW (ref 12.9–16.5)
LYMPHOCYTES ABSOLUTE COUNT: 1 10*9/L — ABNORMAL LOW (ref 1.1–3.6)
LYMPHOCYTES RELATIVE PERCENT: 24.8 %
MEAN CORPUSCULAR HEMOGLOBIN CONC: 31.3 g/dL — ABNORMAL LOW (ref 32.0–36.0)
MEAN CORPUSCULAR HEMOGLOBIN: 29 pg (ref 25.9–32.4)
MEAN CORPUSCULAR VOLUME: 92.7 fL (ref 77.6–95.7)
MEAN PLATELET VOLUME: 8.7 fL (ref 6.8–10.7)
MONOCYTES ABSOLUTE COUNT: 0.8 10*9/L (ref 0.3–0.8)
MONOCYTES RELATIVE PERCENT: 18.9 %
NEUTROPHILS ABSOLUTE COUNT: 1.9 10*9/L (ref 1.8–7.8)
NEUTROPHILS RELATIVE PERCENT: 46.7 %
NUCLEATED RED BLOOD CELLS: 0 /100{WBCs} (ref <=4)
PLATELET COUNT: 204 10*9/L (ref 150–450)
RED BLOOD CELL COUNT: 4.25 10*12/L — ABNORMAL LOW (ref 4.26–5.60)
RED CELL DISTRIBUTION WIDTH: 16.2 % — ABNORMAL HIGH (ref 12.2–15.2)
WBC ADJUSTED: 4 10*9/L (ref 3.6–11.2)

## 2022-08-08 LAB — CMV DNA, QUANTITATIVE, PCR
CMV QUANT LOG(10): 1.72 {Log_IU}/mL — ABNORMAL HIGH (ref <0.00)
CMV QUANT: 52 [IU]/mL — ABNORMAL HIGH (ref <0)
CMV VIRAL LD: DETECTED — AB

## 2022-08-08 LAB — TACROLIMUS LEVEL, TROUGH: TACROLIMUS, TROUGH: 6.1 ng/mL (ref 5.0–15.0)

## 2022-08-08 LAB — MAGNESIUM: MAGNESIUM: 1.7 mg/dL (ref 1.6–2.6)

## 2022-08-08 MED ORDER — ATORVASTATIN 40 MG TABLET
ORAL_TABLET | Freq: Every day | ORAL | 3 refills | 90 days | Status: CP
Start: 2022-08-08 — End: 2023-08-08
  Filled 2022-08-09: qty 90, 90d supply, fill #0

## 2022-08-09 MED FILL — MYCOPHENOLATE SODIUM 180 MG TABLET,DELAYED RELEASE: ORAL | 45 days supply | Qty: 180 | Fill #3

## 2022-08-09 MED FILL — ENVARSUS XR 4 MG TABLET,EXTENDED RELEASE: ORAL | 30 days supply | Qty: 30 | Fill #6

## 2022-08-28 NOTE — Unmapped (Signed)
[facility] Refill Coordination Note    Specialty Medication(s) to be Shipped:   Transplant: Envarsus 4mg     Other medication(s) to be shipped:  [name], DOB: 19-Mar-1951  Phone: [phone] (home)       All above HIPAA information was verified with patient.     Was a Nurse, learning disability used for this call? No    Completed refill call assessment today to schedule patient's medication shipment from the [facility] [phone]).  All relevant notes have been reviewed.     Specialty medication(s) and dose(s) confirmed: Regimen is correct and unchanged.   Changes to medications: Cloud reports no changes at this time.  Changes to insurance: No  New side effects reported not previously addressed with a pharmacist or physician: None reported  Questions for the pharmacist: No    Confirmed patient received a Conservation officer, historic buildings and a Surveyor, mining with first shipment. The patient will receive a drug information handout for each medication shipped and additional FDA Medication Guides as required.       DISEASE/MEDICATION-SPECIFIC INFORMATION        N/A    SPECIALTY MEDICATION ADHERENCE              Were doses missed due to medication being on hold? No    envarsus 4 mg: 11 days of medicine on hand       REFERRAL TO PHARMACIST     Referral to the pharmacist: Not needed      [facility]     Shipping address confirmed in Epic.     Delivery Scheduled: Yes, Expected medication delivery date: 01/26.     Medication will be delivered via Same Day Courier to the prescription address in Epic WAM.    Frank Leach   [facility] Specialty Technician

## 2022-09-01 DIAGNOSIS — Z940 Kidney transplant status: Principal | ICD-10-CM

## 2022-09-01 MED FILL — MG-PLUS-PROTEIN 133 MG TABLET: ORAL | 50 days supply | Qty: 100 | Fill #2

## 2022-09-01 MED FILL — ENVARSUS XR 4 MG TABLET,EXTENDED RELEASE: ORAL | 30 days supply | Qty: 30 | Fill #7

## 2022-09-05 MED ORDER — CANAGLIFLOZIN 100 MG TABLET
ORAL_TABLET | Freq: Every day | ORAL | 3 refills | 0 days | Status: CP
Start: 2022-09-05 — End: ?

## 2022-09-06 NOTE — Unmapped (Signed)
error 

## 2022-09-06 NOTE — Unmapped (Signed)
Pt reports a high OOP cost for his jardiance and wanted to see if there was a medication change that could be made.  Reviewed with Hollice Gong, pt ordered for Canagliflozin 100 mg daily - waiting for insurance approval and cost from [facility].  Pt advised once we see the cost, pharmacy will arrange delivery.  Pt aware he may have a few days of missed doses as he will run out of jardiance tomorrow.

## 2022-09-07 ENCOUNTER — Ambulatory Visit: Admit: 2022-09-07 | Discharge: 2022-09-08 | Payer: MEDICARE

## 2022-09-07 LAB — CBC W/ AUTO DIFF
BASOPHILS ABSOLUTE COUNT: 0.1 10*9/L (ref 0.0–0.1)
BASOPHILS RELATIVE PERCENT: 1.4 %
EOSINOPHILS ABSOLUTE COUNT: 0.2 10*9/L (ref 0.0–0.5)
EOSINOPHILS RELATIVE PERCENT: 3.8 %
HEMATOCRIT: 40.1 % (ref 39.0–48.0)
HEMOGLOBIN: 12.6 g/dL — ABNORMAL LOW (ref 12.9–16.5)
LYMPHOCYTES ABSOLUTE COUNT: 1.1 10*9/L (ref 1.1–3.6)
LYMPHOCYTES RELATIVE PERCENT: 21.7 %
MEAN CORPUSCULAR HEMOGLOBIN CONC: 31.4 g/dL — ABNORMAL LOW (ref 32.0–36.0)
MEAN CORPUSCULAR HEMOGLOBIN: 29.2 pg (ref 25.9–32.4)
MEAN CORPUSCULAR VOLUME: 93 fL (ref 77.6–95.7)
MEAN PLATELET VOLUME: 8 fL (ref 6.8–10.7)
MONOCYTES ABSOLUTE COUNT: 0.8 10*9/L (ref 0.3–0.8)
MONOCYTES RELATIVE PERCENT: 16.9 %
NEUTROPHILS ABSOLUTE COUNT: 2.7 10*9/L (ref 1.8–7.8)
NEUTROPHILS RELATIVE PERCENT: 56.2 %
NUCLEATED RED BLOOD CELLS: 0 /100{WBCs} (ref <=4)
PLATELET COUNT: 202 10*9/L (ref 150–450)
RED BLOOD CELL COUNT: 4.32 10*12/L (ref 4.26–5.60)
RED CELL DISTRIBUTION WIDTH: 15.9 % — ABNORMAL HIGH (ref 12.2–15.2)
WBC ADJUSTED: 4.9 10*9/L (ref 3.6–11.2)

## 2022-09-07 LAB — RENAL FUNCTION PANEL
ALBUMIN: 4.1 g/dL (ref 3.4–5.0)
ANION GAP: 7 mmol/L (ref 5–14)
BLOOD UREA NITROGEN: 31 mg/dL — ABNORMAL HIGH (ref 9–23)
BUN / CREAT RATIO: 16
CALCIUM: 10 mg/dL (ref 8.7–10.4)
CHLORIDE: 115 mmol/L — ABNORMAL HIGH (ref 98–107)
CO2: 22.5 mmol/L (ref 20.0–31.0)
CREATININE: 1.88 mg/dL — ABNORMAL HIGH
EGFR CKD-EPI (2021) MALE: 38 mL/min/{1.73_m2} — ABNORMAL LOW (ref >=60)
GLUCOSE RANDOM: 109 mg/dL (ref 70–179)
PHOSPHORUS: 3 mg/dL (ref 2.4–5.1)
POTASSIUM: 4.7 mmol/L (ref 3.4–4.8)
SODIUM: 144 mmol/L (ref 135–145)

## 2022-09-07 LAB — MAGNESIUM: MAGNESIUM: 1.5 mg/dL — ABNORMAL LOW (ref 1.6–2.6)

## 2022-09-07 LAB — CMV DNA, QUANTITATIVE, PCR: CMV VIRAL LD: NOT DETECTED

## 2022-09-07 LAB — TACROLIMUS LEVEL, TROUGH: TACROLIMUS, TROUGH: 7.5 ng/mL (ref 5.0–15.0)

## 2022-09-11 MED ORDER — EMPAGLIFLOZIN 25 MG TABLET
ORAL_TABLET | Freq: Every day | ORAL | 11 refills | 30 days | Status: CP
Start: 2022-09-11 — End: 2023-09-12
  Filled 2022-09-15: qty 30, 30d supply, fill #0

## 2022-09-22 DIAGNOSIS — E0869 Diabetes mellitus due to underlying condition with other specified complication: Principal | ICD-10-CM

## 2022-09-22 DIAGNOSIS — Z79899 Other long term (current) drug therapy: Principal | ICD-10-CM

## 2022-09-22 DIAGNOSIS — Z940 Kidney transplant status: Principal | ICD-10-CM

## 2022-09-22 MED ORDER — MYCOPHENOLATE SODIUM 180 MG TABLET,DELAYED RELEASE
ORAL_TABLET | Freq: Two times a day (BID) | ORAL | 3 refills | 45 days | Status: CP
Start: 2022-09-22 — End: ?
  Filled 2022-10-03: qty 180, 45d supply, fill #0

## 2022-09-26 MED ORDER — OLMESARTAN 40 MG TABLET
ORAL_TABLET | Freq: Every day | ORAL | 3 refills | 90 days | Status: CP
Start: 2022-09-26 — End: 2023-09-26
  Filled 2022-10-03: qty 90, 90d supply, fill #0

## 2022-09-26 NOTE — Unmapped (Signed)
[facility] Refill Coordination Note    Specialty Medication(s) to be Shipped:   Transplant: Envarsus 1mg , Envarsus 4mg , and mycophenolate mofetil 180mg     Other medication(s) to be shipped:  olmesartan      Frank Leach, DOB: 1951-01-11  Phone: [phone] (home)       All above HIPAA information was verified with patient.     Was a Nurse, learning disability used for this call? No    Completed refill call assessment today to schedule patient's medication shipment from the [facility] [phone]).  All relevant notes have been reviewed.     Specialty medication(s) and dose(s) confirmed: Regimen is correct and unchanged.   Changes to medications: [name] reports no changes at this time.  Changes to insurance: No  New side effects reported not previously addressed with a pharmacist or physician: None reported  Questions for the pharmacist: No    Confirmed patient received a Conservation officer, historic buildings and a Surveyor, mining with first shipment. The patient will receive a drug information handout for each medication shipped and additional FDA Medication Guides as required.       DISEASE/MEDICATION-SPECIFIC INFORMATION        N/A    SPECIALTY MEDICATION ADHERENCE     Medication Adherence    Patient reported X missed doses in the last month: 0  Specialty Medication: ENVARSUS XR 1 mg Tb24 extended release tablet (tacrolimus)  Patient is on additional specialty medications: Yes  Additional Specialty Medications: ENVARSUS XR 4 mg Tb24 extended release tablet (tacrolimus)  Patient Reported Additional Medication X Missed Doses in the Last Month: 0  Patient is on more than two specialty medications: Yes  Specialty Medication: mycophenolate 180 MG EC tablet (MYFORTIC)  Any gaps in refill history greater than 2 weeks in the last 3 months: no  Demonstrates understanding of importance of adherence: yes  Informant: patient  Reliability of informant: reliable  Provider-estimated medication adherence level: good  Patient is at risk for Non-Adherence: No  Reasons for non-adherence: no problems identified  Confirmed plan for next specialty medication refill: delivery by pharmacy  Refills needed for supportive medications: not needed          Refill Coordination    Has the Patients' Contact Information Changed: No  Is the Shipping Address Different: No         Were doses missed due to medication being on hold? No    envarsus 1 mg: 7 days of medicine on hand   envarsus 4 mg: 7 days of medicine on hand   mycophenolate 180 mg: 7 days of medicine on hand       REFERRAL TO PHARMACIST     Referral to the pharmacist: Not needed      [facility]     Shipping address confirmed in Epic.     Patient was notified of new phone menu : No    Delivery Scheduled: Yes, Expected medication delivery date: 02/22.     Medication will be delivered via Same Day Courier to the prescription address in Epic WAM.    Frank Leach   [facility] Specialty Technician

## 2022-09-28 NOTE — Unmapped (Signed)
Frank Leach 's entire shipment will be delayed as a result of insurance processor down.     I have reached out to the patient  at [phone] and communicated the delivery change. We will reschedule the medication for the delivery date that the patient agreed upon.  We have confirmed the delivery date as 10/03/2022, via same day courier.

## 2022-10-03 MED FILL — ENVARSUS XR 4 MG TABLET,EXTENDED RELEASE: ORAL | 30 days supply | Qty: 30 | Fill #8

## 2022-10-03 MED FILL — ENVARSUS XR 1 MG TABLET,EXTENDED RELEASE: ORAL | 30 days supply | Qty: 60 | Fill #2

## 2022-10-09 ENCOUNTER — Ambulatory Visit: Admit: 2022-10-09 | Discharge: 2022-10-10 | Payer: MEDICARE

## 2022-10-09 DIAGNOSIS — Z940 Kidney transplant status: Principal | ICD-10-CM

## 2022-10-09 DIAGNOSIS — E1169 Type 2 diabetes mellitus with other specified complication: Principal | ICD-10-CM

## 2022-10-09 DIAGNOSIS — Z79899 Other long term (current) drug therapy: Principal | ICD-10-CM

## 2022-10-09 DIAGNOSIS — N186 End stage renal disease: Principal | ICD-10-CM

## 2022-10-09 LAB — RENAL FUNCTION PANEL
ALBUMIN: 3.9 g/dL (ref 3.4–5.0)
ANION GAP: 6 mmol/L (ref 5–14)
BLOOD UREA NITROGEN: 34 mg/dL — ABNORMAL HIGH (ref 9–23)
BUN / CREAT RATIO: 15
CALCIUM: 10 mg/dL (ref 8.7–10.4)
CHLORIDE: 116 mmol/L — ABNORMAL HIGH (ref 98–107)
CO2: 24 mmol/L (ref 20.0–31.0)
CREATININE: 2.25 mg/dL — ABNORMAL HIGH
EGFR CKD-EPI (2021) MALE: 31 mL/min/{1.73_m2} — ABNORMAL LOW (ref >=60)
GLUCOSE RANDOM: 100 mg/dL (ref 70–179)
PHOSPHORUS: 3.3 mg/dL (ref 2.4–5.1)
POTASSIUM: 4.9 mmol/L — ABNORMAL HIGH (ref 3.4–4.8)
SODIUM: 146 mmol/L — ABNORMAL HIGH (ref 135–145)

## 2022-10-09 LAB — CBC W/ AUTO DIFF
BASOPHILS ABSOLUTE COUNT: 0.1 10*9/L (ref 0.0–0.1)
BASOPHILS RELATIVE PERCENT: 1.4 %
EOSINOPHILS ABSOLUTE COUNT: 0.3 10*9/L (ref 0.0–0.5)
EOSINOPHILS RELATIVE PERCENT: 5.8 %
HEMATOCRIT: 37.9 % — ABNORMAL LOW (ref 39.0–48.0)
HEMOGLOBIN: 11.8 g/dL — ABNORMAL LOW (ref 12.9–16.5)
LYMPHOCYTES ABSOLUTE COUNT: 1.1 10*9/L (ref 1.1–3.6)
LYMPHOCYTES RELATIVE PERCENT: 21.4 %
MEAN CORPUSCULAR HEMOGLOBIN CONC: 31 g/dL — ABNORMAL LOW (ref 32.0–36.0)
MEAN CORPUSCULAR HEMOGLOBIN: 29.1 pg (ref 25.9–32.4)
MEAN CORPUSCULAR VOLUME: 93.7 fL (ref 77.6–95.7)
MEAN PLATELET VOLUME: 7.8 fL (ref 6.8–10.7)
MONOCYTES ABSOLUTE COUNT: 0.9 10*9/L — ABNORMAL HIGH (ref 0.3–0.8)
MONOCYTES RELATIVE PERCENT: 17.7 %
NEUTROPHILS ABSOLUTE COUNT: 2.7 10*9/L (ref 1.8–7.8)
NEUTROPHILS RELATIVE PERCENT: 53.7 %
NUCLEATED RED BLOOD CELLS: 0 /100{WBCs} (ref <=4)
PLATELET COUNT: 195 10*9/L (ref 150–450)
RED BLOOD CELL COUNT: 4.05 10*12/L — ABNORMAL LOW (ref 4.26–5.60)
RED CELL DISTRIBUTION WIDTH: 15.8 % — ABNORMAL HIGH (ref 12.2–15.2)
WBC ADJUSTED: 5 10*9/L (ref 3.6–11.2)

## 2022-10-09 LAB — TACROLIMUS LEVEL, TROUGH: TACROLIMUS, TROUGH: 8.7 ng/mL (ref 5.0–15.0)

## 2022-10-09 LAB — MAGNESIUM: MAGNESIUM: 1.6 mg/dL (ref 1.6–2.6)

## 2022-10-09 NOTE — Unmapped (Signed)
Called pt to check in after recent labs and bump in creatinine.  Left patient VM to call this [facility] and to push fluids and repeat labs this Friday at his clinic visit.

## 2022-10-09 NOTE — Unmapped (Signed)
Reviewed recent tac level with Dr. Gwynneth Munson, pt advised to decrease envarsus dose to 5 mg daily and repeat labs Friday.

## 2022-10-10 MED FILL — ALLOPURINOL 100 MG TABLET: ORAL | 90 days supply | Qty: 90 | Fill #1

## 2022-10-10 MED FILL — JARDIANCE 25 MG TABLET: ORAL | 30 days supply | Qty: 30 | Fill #1

## 2022-10-11 LAB — DECEASED DONOR CL I&II, LOW RES
DONOR LOW RES DRW #1: 53
DONOR LOW RES HLA A #1: 2
DONOR LOW RES HLA A #2: 26
DONOR LOW RES HLA B #1: 38
DONOR LOW RES HLA B #2: 44
DONOR LOW RES HLA BW #1: 4
DONOR LOW RES HLA C #1: 5
DONOR LOW RES HLA C #2: 12
DONOR LOW RES HLA DQ #1: 2
DONOR LOW RES HLA DQ #2: 8
DONOR LOW RES HLA DR #1: 4
DONOR LOW RES HLA DR #2: 7

## 2022-10-11 LAB — CMV DNA, QUANTITATIVE, PCR
CMV QUANT: <35 [IU]/mL — ABNORMAL HIGH (ref <0)
CMV VIRAL LD: DETECTED — AB

## 2022-10-13 ENCOUNTER — Ambulatory Visit: Admit: 2022-10-13 | Discharge: 2022-10-14 | Payer: MEDICARE

## 2022-10-13 ENCOUNTER — Ambulatory Visit: Admit: 2022-10-13 | Discharge: 2022-10-14 | Payer: MEDICARE | Attending: Nephrology | Primary: Nephrology

## 2022-10-13 DIAGNOSIS — E1169 Type 2 diabetes mellitus with other specified complication: Principal | ICD-10-CM

## 2022-10-13 DIAGNOSIS — Z79899 Other long term (current) drug therapy: Principal | ICD-10-CM

## 2022-10-13 DIAGNOSIS — Z940 Kidney transplant status: Principal | ICD-10-CM

## 2022-10-13 DIAGNOSIS — N186 End stage renal disease: Principal | ICD-10-CM

## 2022-10-13 LAB — RENAL FUNCTION PANEL
ALBUMIN: 4.1 g/dL (ref 3.4–5.0)
ANION GAP: 8 mmol/L (ref 5–14)
BLOOD UREA NITROGEN: 30 mg/dL — ABNORMAL HIGH (ref 9–23)
BUN / CREAT RATIO: 17
CALCIUM: 10.1 mg/dL (ref 8.7–10.4)
CHLORIDE: 110 mmol/L — ABNORMAL HIGH (ref 98–107)
CO2: 22.5 mmol/L (ref 20.0–31.0)
CREATININE: 1.8 mg/dL — ABNORMAL HIGH
EGFR CKD-EPI (2021) MALE: 40 mL/min/{1.73_m2} — ABNORMAL LOW (ref >=60)
GLUCOSE RANDOM: 104 mg/dL — ABNORMAL HIGH (ref 70–99)
PHOSPHORUS: 2.7 mg/dL (ref 2.4–5.1)
POTASSIUM: 5.2 mmol/L — ABNORMAL HIGH (ref 3.4–4.8)
SODIUM: 140 mmol/L (ref 135–145)

## 2022-10-13 LAB — CBC W/ AUTO DIFF
BASOPHILS ABSOLUTE COUNT: 0.1 10*9/L (ref 0.0–0.1)
BASOPHILS RELATIVE PERCENT: 1.4 %
EOSINOPHILS ABSOLUTE COUNT: 0.2 10*9/L (ref 0.0–0.5)
EOSINOPHILS RELATIVE PERCENT: 5.3 %
HEMATOCRIT: 37.8 % — ABNORMAL LOW (ref 39.0–48.0)
HEMOGLOBIN: 12 g/dL — ABNORMAL LOW (ref 12.9–16.5)
LYMPHOCYTES ABSOLUTE COUNT: 0.8 10*9/L — ABNORMAL LOW (ref 1.1–3.6)
LYMPHOCYTES RELATIVE PERCENT: 19.7 %
MEAN CORPUSCULAR HEMOGLOBIN CONC: 31.9 g/dL — ABNORMAL LOW (ref 32.0–36.0)
MEAN CORPUSCULAR HEMOGLOBIN: 29.5 pg (ref 25.9–32.4)
MEAN CORPUSCULAR VOLUME: 92.5 fL (ref 77.6–95.7)
MEAN PLATELET VOLUME: 8.5 fL (ref 6.8–10.7)
MONOCYTES ABSOLUTE COUNT: 0.8 10*9/L (ref 0.3–0.8)
MONOCYTES RELATIVE PERCENT: 18.9 %
NEUTROPHILS ABSOLUTE COUNT: 2.3 10*9/L (ref 1.8–7.8)
NEUTROPHILS RELATIVE PERCENT: 54.7 %
NUCLEATED RED BLOOD CELLS: 0 /100{WBCs} (ref <=4)
PLATELET COUNT: 189 10*9/L (ref 150–450)
RED BLOOD CELL COUNT: 4.08 10*12/L — ABNORMAL LOW (ref 4.26–5.60)
RED CELL DISTRIBUTION WIDTH: 15.8 % — ABNORMAL HIGH (ref 12.2–15.2)
WBC ADJUSTED: 4.2 10*9/L (ref 3.6–11.2)

## 2022-10-13 LAB — IRON & TIBC
IRON SATURATION: 37 % (ref 20–55)
IRON: 81 ug/dL
TOTAL IRON BINDING CAPACITY: 219 ug/dL — ABNORMAL LOW (ref 250–425)

## 2022-10-13 LAB — URINALYSIS WITH MICROSCOPY
BACTERIA: NONE SEEN /HPF
BILIRUBIN UA: NEGATIVE
BLOOD UA: NEGATIVE
GLUCOSE UA: 500 — AB
KETONES UA: NEGATIVE
LEUKOCYTE ESTERASE UA: NEGATIVE
NITRITE UA: NEGATIVE
PH UA: 5.5 (ref 5.0–9.0)
PROTEIN UA: NEGATIVE
RBC UA: <1 /HPF (ref <3)
SPECIFIC GRAVITY UA: 1.01 (ref 1.005–1.030)
SQUAMOUS EPITHELIAL: <1 /HPF (ref 0–5)
UROBILINOGEN UA: 0.2
WBC UA: <1 /HPF (ref <2)

## 2022-10-13 LAB — MAGNESIUM: MAGNESIUM: 1.6 mg/dL (ref 1.6–2.6)

## 2022-10-13 LAB — FERRITIN: FERRITIN: 512.5 ng/mL — ABNORMAL HIGH

## 2022-10-13 LAB — TACROLIMUS LEVEL, TROUGH: TACROLIMUS, TROUGH: 5 ng/mL (ref 5.0–15.0)

## 2022-10-13 LAB — PROTEIN / CREATININE RATIO, URINE
CREATININE, URINE: 40.1 mg/dL
PROTEIN URINE: 9.9 mg/dL
PROTEIN/CREAT RATIO, URINE: 0.247

## 2022-10-13 LAB — ALBUMIN / CREATININE URINE RATIO
ALBUMIN QUANT URINE: 1.2 mg/dL
ALBUMIN/CREATININE RATIO: 29.7 ug/mg (ref 0.0–30.0)
CREATININE, URINE: 40.4 mg/dL

## 2022-10-13 LAB — HEMOGLOBIN A1C
ESTIMATED AVERAGE GLUCOSE: 120 mg/dL
HEMOGLOBIN A1C: 5.8 % — ABNORMAL HIGH (ref 4.8–5.6)

## 2022-10-13 LAB — BK VIRUS QUANTITATIVE PCR, BLOOD: BK BLOOD RESULT: NOT DETECTED

## 2022-10-13 LAB — PARATHYROID HORMONE (PTH): PARATHYROID HORMONE INTACT: 250 pg/mL — ABNORMAL HIGH (ref 18.5–88.1)

## 2022-10-13 NOTE — Unmapped (Signed)
Transplant Nephrology Clinic Visit    History of Present Illness    72 y.o. male here for follow up after kidney transplantation.      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 Interval decrease in linear soft tissue within the right nephrectomy bed, potentially postsurgical or fat necrosis. Unchanged linear tissue within the left nephrectomy bed. No suspicious enhancing lesions are visualized within the surgical beds     Post-Txp 06/25/2019 (txp kidney only)  The renal transplant was located in the left lower quadrant. Normal size and echogenicity.  No solid masses or calculi. Trace perinephric fluid adjacent to the lower pole of the kidney. Mild pelviectasis  - Perfusion: Using power Doppler, normal perfusion was seen throughout the renal parenchyma.  - Resistive indices in the renal transplant are stable compared with prior examination.  - Main renal artery/iliac artery: Patent. Again noted are 3 renal arteries. Resistive indices within the renal arteries are stable to minimally increased, now at or just above normal limits.  - Main renal vein/iliac vein: Patent       Subjective/Interval:   Cr was increased on last labs--increased PO fluids and envarsus dose was decreased based on level, repeat labs today planned.    BP at home 130-150s/70s  Glucose 120s-130s mg/dL--on metformin and jardiance    No hospitalizations, no ED visits in interval since last visit  No chest pain, no shortness of breath at rest, no lower extremity edema, no nausea, no vomiting, no diarrhea, no pain swallowing, no tremors, no fevers, no chills, no skin changes.  No pain urinating, no trouble passing urine, no visible blood in urine.  The patient reports they are able to obtain and take their immunosuppressants without issues.    (+) left AVF getting bigger, but no arm swelling--was to get AVF ligation prior to pandemic and he remains interested            Review of Systems      Otherwise as per HPI, all other systems reviewed and are negative.    Past Medical History, Surgical History, Family History, and Social History reviewed in electronic record        Medications  Current Outpatient Medications   Medication Sig Dispense Refill    acetaminophen (TYLENOL) 500 MG tablet Take 1-2 tablets (500-1,000 mg total) by mouth every six (6) hours as needed for pain or fever (> 38C). 100 tablet 0    allopurinol (ZYLOPRIM) 100 MG tablet Take 1 tablet (100 mg total) by mouth every evening. 90 tablet 3    atorvastatin (LIPITOR) 40 MG tablet Take 1 tablet (40 mg total) by mouth daily. 90 tablet 3    empagliflozin (JARDIANCE) 25 mg tablet Take 1 tablet (25 mg total) by mouth daily. 30 tablet 11    ENVARSUS XR 1 mg Tb24 extended release tablet Take 2 tablets (2 mg total) by mouth in the morning. Take 2 tablets (  2 mg total) in addition to 4 mg tablet for a total dose of 6 mg daily. 60 tablet 11    ENVARSUS XR 4 mg Tb24 extended release tablet Take 1 tablet (4 mg total) by mouth daily. Take 1 tablet ( 4 mg total) in addition to 1 mg tablets for  Total daily dose of 6 mg 30 tablet 11    magnesium oxide-Mg AA chelate (MAGNESIUM, AMINO ACID CHELATE,) 133 mg Take 1 tablet by mouth two (2) times a day. 100 tablet 11    metFORMIN (GLUCOPHAGE-XR) 500 MG 24 hr tablet Take 1 tablet (500 mg total) by mouth in the morning. 180 tablet 3 mycophenolate (MYFORTIC) 180 MG EC tablet Take 2 tablets (360 mg total) by mouth Two (2) times a day. 180 tablet 3    olmesartan (BENICAR) 40 MG tablet Take 1 tablet (40 mg total) by mouth daily. 90 tablet 3     No current facility-administered medications for this visit.           Physical Exam  BP 167/59 (BP Site: R Arm, BP Position: Sitting, BP Cuff Size: Medium)  - Pulse 54  - Temp 36 ??C (96.8 ??F) (Temporal)  - Ht 170.2 cm (5' 7)  - Wt 68.2 kg (150 lb 6.4 oz)  - BMI 23.56 kg/m??   General: no acute distress  HEENT: mucous membranes moist  Neck: neck supple, no cervical lymphadenopathy appreciated  CV: normal rate, normal rhythm, no murmur, no gallops, no rubs appreciated  Lungs: clear to auscultation bilaterally  Abdomen: soft, non tender  Extremities:  no edema, LUE AVF with bruit and thrill  Musculoskeletal: no visible deformity, normal range of motion.  Pulses: intact distally throughout  Neurologic: awake, alert, and oriented x3      Laboratory Data and Imaging reviewed in EPIC      Assessment:    ICD-10-CM   1. S/P kidney transplant  Z94.0   2. Hypertension, unspecified type  I10   3. 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 Increase in creatinine was likely due to high tacrolimus level and volume depletion. UPCR stable at 0.247g.    Immunosuppression [High Risk Medical Decision Making For Drug Therapy Requiring Intensive Monitoring For Toxicity]: tacrolimus trough level 5.0 ng/mL today, within goal of approximately 5-7 ng/mL. Continue current dose of mycophenolate.     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          Counseling:  I counseled the patient on:  The need to avoid sun exposure and the use of sunblock while outdoors given the relatively higher risk of skin malignancy in an immunosuppressed state.  The need for adherence to immunosuppression medication.  Patient verbalized understanding.     Follow-Up:  Return to clinic in 3 months  Patient will continue to follow-up with his primary care provider for non-transplant related issues and medication refills. We have ordered transplant specific labs per the center's guidelines to monitor and assess for toxicities from immunosuppressant drug therapy          Clelia Croft, DO  Transplant Nephrology  [facility] Division of Nephrology and Hypertension  10/13/2022  8:21 AM

## 2022-10-13 NOTE — Unmapped (Signed)
Dr. Teryl Lucy asked to see Frank Leach again regarding fistula ligation. Last creatinine 1.8.  Transplanted 2020. First seen September of 2021 but he declined surgery at that time.

## 2022-10-13 NOTE — Unmapped (Signed)
Met w/ patient in [facility] today. Reviewed meds/symptoms. Any new medications?                 Fever/cold/flu symptoms denies  BP: 167/59 today/ Home BP reported 140-150/70  BG: 120-130  Headache/Dizziness/Lightheaded: denies  Hand tremors: denies  Numbness/tingling: denies  Fevers/chills/sweats: denies  CP/SOB/palpatations: denies  Nausea/vomiting/heartburn: denies  Diarrhea/constipation: denies  UTI symptoms (burn/pain/itch/frequency/urgency/odor/color/foam): denies  No visible or palpable edema     Appetite good; reports adequate hydration. 6 bottles/fluid per day.     Pt reports being well rested and getting adequate exercise.     Continues to follow Covid/health safety precautions by taking care to mask, perform frequent hand hygeine and minimal public activity. Offered support and guidance for this process given their immune-suppressed state. We discussed reduced covid vaccine coverage for transplant patients and importance of continuing to mask and practice safe distancing. Commented that booster vaccines will likely be advised as an ongoing process.        Last envarsus taken 0800; held for this morning's labs. Current dose 5 mg daily; Myfortic 360 mg bid     Other complaints or concerns: none - elevated creatinine - repeat today    Referrals needed: n/a     Pt Follow up: labs     Immunization status: none in clinic today     Functional Score: 100     Employment/work status:  does not work

## 2022-10-14 LAB — CMV DNA, QUANTITATIVE, PCR
CMV QUANT: <35 [IU]/mL — ABNORMAL HIGH (ref <0)
CMV VIRAL LD: DETECTED — AB

## 2022-10-18 LAB — VITAMIN D 25 HYDROXY: VITAMIN D, TOTAL (25OH): 37.6 ng/mL (ref 20.0–80.0)

## 2022-10-19 LAB — VITAMIN D 1,25 DIHYDROXY: VITAMIN D 1,25-DIHYDROXY: 28 pg/mL

## 2022-10-23 NOTE — Unmapped (Signed)
[facility] Refill Coordination Note    Specialty Medication(s) to be Shipped:   Transplant: Envarsus 1mg  and Envarsus 4mg     Other medication(s) to be shipped:  magnesium     Frank Leach, DOB: February 02, 1951  Phone: [phone] (home)       All above HIPAA information was verified with patient.     Was a Nurse, learning disability used for this call? No    Completed refill call assessment today to schedule patient's medication shipment from the [facility] [phone]).  All relevant notes have been reviewed.     Specialty medication(s) and dose(s) confirmed: Regimen is correct and unchanged.   Changes to medications: [name] reports no changes at this time.  Changes to insurance: No  New side effects reported not previously addressed with a pharmacist or physician: None reported  Questions for the pharmacist: No    Confirmed patient received a Conservation officer, historic buildings and a Surveyor, mining with first shipment. The patient will receive a drug information handout for each medication shipped and additional FDA Medication Guides as required.       DISEASE/MEDICATION-SPECIFIC INFORMATION        N/A    SPECIALTY MEDICATION ADHERENCE     Medication Adherence    Patient reported X missed doses in the last month: 0  Specialty Medication: ENVARSUS XR 1 mg Tb24 extended release tablet (tacrolimus)  Patient is on additional specialty medications: Yes  Additional Specialty Medications: ENVARSUS XR 4 mg Tb24 extended release tablet (tacrolimus)  Patient Reported Additional Medication X Missed Doses in the Last Month: 0  Patient is on more than two specialty medications: Yes  Specialty Medication: mycophenolate 180 MG EC tablet (MYFORTIC)  Patient Reported Additional Medication X Missed Doses in the Last Month: 0              Were doses missed due to medication being on hold? No    ENVARSUS XR 1 mg: 7 days of medicine on hand   ENVARSUS XR 4  mg: 7 days of medicine on hand         REFERRAL TO PHARMACIST     Referral to the pharmacist: Not needed      [facility]     Shipping address confirmed in Epic.     Patient was notified of new phone menu : Yes    Delivery Scheduled: Yes, Expected medication delivery date: 10/26/22.     Medication will be delivered via UPS to the prescription address in Epic WAM.    Frank Leach   [facility] Specialty Technician

## 2022-10-25 MED FILL — ENVARSUS XR 1 MG TABLET,EXTENDED RELEASE: ORAL | 30 days supply | Qty: 60 | Fill #3

## 2022-10-25 MED FILL — MG-PLUS-PROTEIN 133 MG TABLET: ORAL | 50 days supply | Qty: 100 | Fill #3

## 2022-10-25 MED FILL — ENVARSUS XR 4 MG TABLET,EXTENDED RELEASE: ORAL | 30 days supply | Qty: 30 | Fill #9

## 2022-11-02 ENCOUNTER — Ambulatory Visit: Admit: 2022-11-02 | Discharge: 2022-11-03 | Payer: MEDICARE | Attending: Surgery | Primary: Surgery

## 2022-11-02 NOTE — Unmapped (Signed)
VASCULAR ACCESS CLINIC    Assessment/Plan  Frank Leach presents to vascular access clinic at the referral of Frank Leach for LUE AVF ligation.    OR: Consent was obtained today for LUE AVF ligation    Follow up / Plan pending:   - Return for LUE AVF ligation.    HPI / Subjective  Frank Leach is a 72 y.o. male who presents for evaluation of left upper extremity AVF ligation. He was scheduled to have the procedure prior to the pandemic and remains interested. The fistula has increased in size since then. Their PMH is significant for ESRD secondary to HTN s/p kidney transplant 06/22/2019 and DM. Patient denies having to use dialysis since transplant.    Hx of access sites: LUE        Allergies  Patient has no known allergies.    Medications    Current Outpatient Medications   Medication Sig Dispense Refill    acetaminophen (TYLENOL) 500 MG tablet Take 1-2 tablets (500-1,000 mg total) by mouth every six (6) hours as needed for pain or fever (> 38C). 100 tablet 0    allopurinol (ZYLOPRIM) 100 MG tablet Take 1 tablet (100 mg total) by mouth every evening. 90 tablet 3    atorvastatin (LIPITOR) 40 MG tablet Take 1 tablet (40 mg total) by mouth daily. 90 tablet 3    empagliflozin (JARDIANCE) 25 mg tablet Take 1 tablet (25 mg total) by mouth daily. 30 tablet 11    ENVARSUS XR 1 mg Tb24 extended release tablet Take 2 tablets (2 mg total) by mouth in the morning. Take 2 tablets ( 2 mg total) in addition to 4 mg tablet for a total dose of 6 mg daily. 60 tablet 11    ENVARSUS XR 4 mg Tb24 extended release tablet Take 1 tablet (4 mg total) by mouth daily. Take 1 tablet ( 4 mg total) in addition to 1 mg tablets for  Total daily dose of 6 mg 30 tablet 11    magnesium oxide-Mg AA chelate (MAGNESIUM, AMINO ACID CHELATE,) 133 mg Take 1 tablet by mouth two (2) times a day. 100 tablet 11    metFORMIN (GLUCOPHAGE-XR) 500 MG 24 hr tablet Take 1 tablet (500 mg total) by mouth in the morning. 180 tablet 3 mycophenolate (MYFORTIC) 180 MG EC tablet Take 2 tablets (360 mg total) by mouth Two (2) times a day. 180 tablet 3    olmesartan (BENICAR) 40 MG tablet Take 1 tablet (40 mg total) by mouth daily. 90 tablet 3     No current facility-administered medications for this visit.       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)     ESRD on dialysis (CMS-HCC)     Gout     Hyperlipidemia     Hypertension        Past Surgical History  Past Surgical History:   Procedure Laterality Date    AV FISTULA PLACEMENT      PR COLONOSCOPY FLX DX W/COLLJ SPEC WHEN PFRMD N/A 04/30/2018    Procedure: COLONOSCOPY, FLEXIBLE, PROXIMAL TO SPLENIC FLEXURE; DIAGNOSTIC, W/WO COLLECTION SPECIMEN BY BRUSH OR WASH;  Surgeon: Luanne Bras, MD;  [facility]  [facility]B GI PROCEDURES [facility];  Service: Gastroenterology    PR COLONOSCOPY W/BIOPSY SINGLE/MULTIPLE N/A 04/30/2018    Procedure: COLONOSCOPY, FLEXIBLE, PROXIMAL TO SPLENIC FLEXURE; WITH BIOPSY, SINGLE OR MULTIPLE;  Surgeon: Luanne Bras, MD;  Location: [facility]OB GI PROCEDURES [facility];  Service: Gastroenterology    PR COLSC FLX W/RMVL OF TUMOR POLYP LESION SNARE TQ N/A 04/30/2018    Procedure: COLONOSCOPY FLEX; W/REMOV TUMOR/LES BY SNARE;  Surgeon: Luanne Bras, MD;  Location: [facility] GI PROCEDURES [facility];  Service: Gastroenterology    PR EXPLORATORY OF ABDOMEN Midline 04/14/2016    Procedure: EXPLORATORY LAPAROTOMY, EXPLORATORY CELIOTOMY WITH OR WITHOUT BIOPSY(S);  Surgeon: Leona Carry, MD;  Location: MAIN OR [facility];  Service: Transplant    PR EXPLORATORY OF ABDOMEN N/A 04/16/2016    Procedure: EXPLORATORY LAPAROTOMY, EXPLORATORY CELIOTOMY WITH OR WITHOUT BIOPSY(S);  Surgeon: Leona Carry, MD;  Location: MAIN OR [facility];  Service: Transplant    PR LAP, RADICAL NEPHRECTOMY Right 12/27/2016    Procedure: Robotic Xi Laparoscopy; Radical Nephrectomy (Incl Remove Gerota`S Fascia, Fatty Tissue, Reg Lymph Node, Adrenalectomy);  Surgeon: Tomie China, MD;  Location: MAIN OR [facility];  Service: Urology    PR NEGATIVE PRESSURE WOUND THERAPY DME >50 SQ CM N/A 04/12/2016    Procedure: NEG PRESS WOUND TX (VAC ASSIST) INCL TOPICALS, PER SESSION, TSA GREATER THAN/= 50 CM SQUARED;  Surgeon: Leona Carry, MD;  Location: MAIN OR [place];  Service: Transplant    PR NEGATIVE PRESSURE WOUND THERAPY DME >50 SQ CM Bilateral 04/14/2016    Procedure: NEG PRESS WOUND TX (VAC ASSIST) INCL TOPICALS, PER SESSION, TSA GREATER THAN/= 50 CM SQUARED;  Surgeon: Leona Carry, MD;  Location: MAIN OR [place];  Service: Transplant    PR NEGATIVE PRESSURE WOUND THERAPY DME >50 SQ CM N/A 04/16/2016    Procedure: NEG PRESS WOUND TX (VAC ASSIST) INCL TOPICALS, PER SESSION, TSA GREATER THAN/= 50 CM SQUARED;  Surgeon: Leona Carry, MD;  Location: MAIN OR [facility];  Service: Transplant    PR NEPHRECTOMY, W/PART. URETECTOMY Bilateral 04/11/2016    Procedure: LAPAROSCOPY, SURGICAL, NEPHRECTOMY WITH TOTAL URETERECTOMY;  Surgeon: Leona Carry, MD;  Location: MAIN OR [facility];  Service: Transplant    PR REMV KIDNEY,W/RIB RESECTION Bilateral 04/11/2016    Procedure: NEPHRECTOMY, INCLUDING PARTIAL URETERECTOMY, ANY OPEN APPROACH INCLUDING RIB RESECTION;  Surgeon: Leona Carry, MD;  Location: MAIN OR [facility];  Service: Transplant    PR TRANSPLANT,PREP LIVING  RENAL GRAFT N/A 06/22/2019    Procedure: BACKBENCH STD PREP LIVING DONOR RENAL ALLGRFT (OPEN/LAPROSC) PRIOR TO TRANSPLANT, INC DISSECT/REM AS NECESS;  Surgeon: Leona Carry, MD;  Location: MAIN OR [facility];  Service: Transplant    PR TRANSPLANTATION OF KIDNEY N/A 06/22/2019    Procedure: RENAL ALLOTRANSPLANTATION, IMPLANTATION OF GRAFT; WITHOUT RECIPIENT NEPHRECTOMY;  Surgeon: Leona Carry, MD; Location: MAIN OR [facility];  Service: Transplant    SPLENECTOMY         Family History  The patient's family history includes Cancer in his father; Kidney disease in his brother and mother..    Social History  Tobacco use: never a smoker    Review of Systems  A 12 system review of systems was negative except as noted in HPI    Objective  Vitals: Blood pressure 151/63, pulse 62, temperature 36.9 ??C (98.5 ??F), temperature source Tympanic, height 170.2 cm (5' 7.01), weight 68.1 kg (150 lb 3.2 oz). Body mass index is 23.52 kg/m??.  General: pleasant, cooperative, no acute distress, sitting in  chair comfortably  Lungs: unlabored breathing, stable on room air   Heart: reg rate,  HDS, hypertensive  Neuro: A&Ox3  Ext: no edema, well perfused.   Inspection of the fistula reveals tortuosity. Left arm inspection is negative for swelling. The fistula is soft and compressible to touch. The skin overlying the fistula is of appropriate temperature. No pain with palpation.       Test Results  Lab Results   Component Value Date    WBC 4.2 10/13/2022    HGB 12.0 (L) 10/13/2022    HCT 37.8 (L) 10/13/2022    PLT 189 10/13/2022     Lab Results   Component Value Date    NA 140 10/13/2022    K 5.2 (H) 10/13/2022    CL 110 (H) 10/13/2022    CO2 22.5 10/13/2022    BUN 30 (H) 10/13/2022    CREATININE 1.80 (H) 10/13/2022    CALCIUM 10.1 10/13/2022    MG 1.6 10/13/2022    PHOS 2.7 10/13/2022     Lab Results   Component Value Date    INR 1.06 01/19/2022    APTT 27.9 01/19/2022     B POS      Imaging/Vein Mapping:    None    I personally spent 20 minutes face-to-face and non-face-to-face in the care of this patient, which includes all pre, intra, and post visit time on the date of service. Greater than 50% of the time was spent on counseling and the substance of the discussion    I have verified all student documentation or findings.  I have personally performed or re-performed the physical exam and medical decision making.  AH Hardy D

## 2022-11-02 NOTE — Unmapped (Unsigned)
y

## 2022-11-06 ENCOUNTER — Ambulatory Visit: Admit: 2022-11-06 | Discharge: 2022-11-07 | Payer: MEDICARE

## 2022-11-06 LAB — CBC W/ AUTO DIFF
BASOPHILS ABSOLUTE COUNT: 0.1 10*9/L (ref 0.0–0.1)
BASOPHILS RELATIVE PERCENT: 2.7 %
EOSINOPHILS ABSOLUTE COUNT: 0.2 10*9/L (ref 0.0–0.5)
EOSINOPHILS RELATIVE PERCENT: 5.6 %
HEMATOCRIT: 37.3 % — ABNORMAL LOW (ref 39.0–48.0)
HEMOGLOBIN: 11.7 g/dL — ABNORMAL LOW (ref 12.9–16.5)
LYMPHOCYTES ABSOLUTE COUNT: 0.9 10*9/L — ABNORMAL LOW (ref 1.1–3.6)
LYMPHOCYTES RELATIVE PERCENT: 23.3 %
MEAN CORPUSCULAR HEMOGLOBIN CONC: 31.3 g/dL — ABNORMAL LOW (ref 32.0–36.0)
MEAN CORPUSCULAR HEMOGLOBIN: 29.5 pg (ref 25.9–32.4)
MEAN CORPUSCULAR VOLUME: 94.1 fL (ref 77.6–95.7)
MEAN PLATELET VOLUME: 8.2 fL (ref 6.8–10.7)
MONOCYTES ABSOLUTE COUNT: 0.7 10*9/L (ref 0.3–0.8)
MONOCYTES RELATIVE PERCENT: 18.7 %
NEUTROPHILS ABSOLUTE COUNT: 2 10*9/L (ref 1.8–7.8)
NEUTROPHILS RELATIVE PERCENT: 49.7 %
NUCLEATED RED BLOOD CELLS: 0 /100{WBCs} (ref <=4)
PLATELET COUNT: 210 10*9/L (ref 150–450)
RED BLOOD CELL COUNT: 3.97 10*12/L — ABNORMAL LOW (ref 4.26–5.60)
RED CELL DISTRIBUTION WIDTH: 15.9 % — ABNORMAL HIGH (ref 12.2–15.2)
WBC ADJUSTED: 3.9 10*9/L (ref 3.6–11.2)

## 2022-11-06 LAB — CMV DNA, QUANTITATIVE, PCR
CMV QUANT LOG(10): 1.88 {Log_IU}/mL — ABNORMAL HIGH (ref <0.00)
CMV QUANT: 75 [IU]/mL — ABNORMAL HIGH (ref <0)
CMV VIRAL LD: DETECTED — AB

## 2022-11-06 LAB — RENAL FUNCTION PANEL
ALBUMIN: 4 g/dL (ref 3.4–5.0)
ANION GAP: 6 mmol/L (ref 5–14)
BLOOD UREA NITROGEN: 37 mg/dL — ABNORMAL HIGH (ref 9–23)
BUN / CREAT RATIO: 20
CALCIUM: 10.4 mg/dL (ref 8.7–10.4)
CHLORIDE: 113 mmol/L — ABNORMAL HIGH (ref 98–107)
CO2: 23.4 mmol/L (ref 20.0–31.0)
CREATININE: 1.82 mg/dL — ABNORMAL HIGH
EGFR CKD-EPI (2021) MALE: 39 mL/min/{1.73_m2} — ABNORMAL LOW (ref >=60)
GLUCOSE RANDOM: 103 mg/dL (ref 70–179)
PHOSPHORUS: 3.1 mg/dL (ref 2.4–5.1)
POTASSIUM: 5.3 mmol/L — ABNORMAL HIGH (ref 3.4–4.8)
SODIUM: 142 mmol/L (ref 135–145)

## 2022-11-06 LAB — MAGNESIUM: MAGNESIUM: 1.6 mg/dL (ref 1.6–2.6)

## 2022-11-06 LAB — TACROLIMUS LEVEL, TROUGH: TACROLIMUS, TROUGH: 4.6 ng/mL — ABNORMAL LOW (ref 5.0–15.0)

## 2022-11-07 DIAGNOSIS — N186 End stage renal disease: Principal | ICD-10-CM

## 2022-11-07 NOTE — Unmapped (Signed)
Letter to patient with pre op instructions for AVF ligation on 4/24. No blood thinners

## 2022-11-08 LAB — HLA DS POST TRANSPLANT
ANTI-DONOR DRW #1 MFI: 71 MFI
ANTI-DONOR HLA-A #1 MFI: 37 MFI
ANTI-DONOR HLA-A #2 MFI: 0 MFI
ANTI-DONOR HLA-B #1 MFI: 0 MFI
ANTI-DONOR HLA-B #2 MFI: 0 MFI
ANTI-DONOR HLA-C #1 MFI: 80 MFI
ANTI-DONOR HLA-C #2 MFI: 0 MFI
ANTI-DONOR HLA-DQB #1 MFI: 10 MFI
ANTI-DONOR HLA-DQB #2 MFI: 515 MFI
ANTI-DONOR HLA-DR #1 MFI: 38 MFI
ANTI-DONOR HLA-DR #2 MFI: 62 MFI

## 2022-11-08 LAB — FSAB CLASS 1 ANTIBODY SPECIFICITY: HLA CLASS 1 ANTIBODY RESULT: POSITIVE

## 2022-11-08 LAB — FSAB CLASS 2 ANTIBODY SPECIFICITY: HLA CL2 AB RESULT: POSITIVE

## 2022-11-09 MED FILL — ATORVASTATIN 40 MG TABLET: ORAL | 90 days supply | Qty: 90 | Fill #1

## 2022-11-09 MED FILL — JARDIANCE 25 MG TABLET: ORAL | 30 days supply | Qty: 30 | Fill #2

## 2022-11-13 NOTE — Unmapped (Signed)
[facility] Refill Coordination Note    Specialty Medication(s) to be Shipped:   Transplant: mycophenolate mofetil 180mg     Other medication(s) to be shipped:  none at this time     Frank Leach, DOB: 11/14/1950  Phone: [phone] (home)       All above HIPAA information was verified with patient.     Was a Nurse, learning disability used for this call? No    Completed refill call assessment today to schedule patient's medication shipment from the [facility] [phone]).  All relevant notes have been reviewed.     Specialty medication(s) and dose(s) confirmed: Regimen is correct and unchanged.   Changes to medications: Frank Leach no changes at this time.  Changes to insurance: No  New side effects reported not previously addressed with a pharmacist or physician: None reported  Questions for the pharmacist: No    Confirmed patient received a Conservation officer, historic buildings and a Surveyor, mining with first shipment. The patient will receive a drug information handout for each medication shipped and additional FDA Medication Guides as required.       DISEASE/MEDICATION-SPECIFIC INFORMATION        N/A    SPECIALTY MEDICATION ADHERENCE     Medication Adherence    Patient reported X missed doses in the last month: 0  Specialty Medication: mycophenolate 180 MG EC tablet (MYFORTIC)  Patient is on additional specialty medications: No  Informant: patient              Were doses missed due to medication being on hold? No    Mycophenolate 180 mg: Patient has 3 days of medication on hand     REFERRAL TO PHARMACIST     Referral to the pharmacist: Not needed      [facility]     Shipping address confirmed in Epic.     Delivery Scheduled: Yes, Expected medication delivery date: 4/10.     Medication will be delivered via  to the prescription address in Epic WAM.    Alwyn Pea   [facility] Specialty Technician

## 2022-11-15 MED FILL — MYCOPHENOLATE SODIUM 180 MG TABLET,DELAYED RELEASE: ORAL | 45 days supply | Qty: 180 | Fill #1

## 2022-11-27 ENCOUNTER — Emergency Department: Admit: 2022-11-27 | Discharge: 2022-11-28 | Disposition: A | Discharge status: Home / Self Care | Payer: MEDICARE

## 2022-11-27 ENCOUNTER — Ambulatory Visit: Admit: 2022-11-27 | Discharge: 2022-11-28 | Disposition: A | Discharge status: Home / Self Care | Payer: MEDICARE

## 2022-11-27 DIAGNOSIS — W19XXXA Unspecified fall, initial encounter: Principal | ICD-10-CM

## 2022-11-27 MED ADMIN — diph,pertuss(acel),tetanus vaccine-Tdap (BOOSTRIX) injection 0.5 mL: .5 mL | INTRAMUSCULAR | @ 23:00:00 | Stop: 2022-11-27

## 2022-11-27 NOTE — Unmapped (Signed)
Pt reports fall while leaving [employer] today.  Stepped off the curb and fell on his his knees, hands and hit his chin &  mouth on the pavement.  Chipped his front top teeth.  Denies loc.  Did not break his glasses he was wearing.  Kidney transplant pt. Denies dizziness prior to fall, denies cp or shob.

## 2022-11-28 NOTE — Unmapped (Signed)
[facility]  Emergency Department Provider Note     ED Clinical Impression     Final diagnoses:   None      Impression, Medical Decision Making, ED Course     Impression: 72 y.o. male with PMH most significant for ESRD (s/p DDKT in 2020), HTN, hyperlipidemia, T2DM, and gout who presents with right shoulder pain, as well as chipped front teeth and a laceration to the lower lip, since sustaining a non-syncopal fall around  as described below.    On exam, patient is nontoxic appearing and in no acute distress. Vital signs are notable for hypertension to 183/67; otherwise hemodynamically stable, afebrile. Exam is remarkable for superior lip swelling and abrasion, but no lacerations. There is a 1cm laceration that is hemostatic and well-approximated on the anterior surface of the inferior lip. No hemotympanum. There is a 3cm abrasion to the left wrist. There is TTP of the right posterior deltoid. There is a 4cm well-circumscribed mobile mass in the RUE. Patient with a 1cm abrasion on the thenar eminence of the right hand, as well as 2cm abrasion on the proximal aspect of the thumb. Full ROM of the right thumb. Good radial pulse. Fistula in the LUE with palpable thrill. Posterior thorax unremarkable. No midline spinal tenderness. Small abrasions to the superior patellas bilaterally.        ____________________________________________    The case was discussed with the attending physician, who is in agreement with the above assessment and plan.      History     Chief Complaint  Chief Complaint   Patient presents with    Fall       HPI   Frank Leach is a 72 y.o. male with past medical history as below who presents after a fall. The patient reports that he was walking out of [employer] around 4:30PM when he tripped over the curb and fell forward onto his knees and hands, bracing most of his weight on the right side, then struck his chin on the ground. No LOC. No preceding chest pain, palpitations, or dizziness. He is not anticoagulated. Currently, he reports right shoulder pain, as well as chipped front teeth on the top and a laceration to the lower lip. Of note, he wears dentures. Last Tdap in 2018. He endorses difficulty standing after the fall and required assistance to stand. He ambulates without assistance at baseline, and he has ambulated since the incident. He denies nausea, vomiting, headache, shortness of breath, or abdominal pain.     Outside Historian(s): I have obtained additional history/collateral from patient's wife at bedside.    External Records Reviewed: I have reviewed recent and relevant previous record, including: Outpatient notes - 11/02/2022 Transplant surgery note for patient's past medical history.    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)     ESRD on dialysis (CMS-HCC)     Gout     Hyperlipidemia     Hypertension        Past Surgical History:   Procedure Laterality Date    AV FISTULA PLACEMENT      PR COLONOSCOPY FLX DX W/COLLJ SPEC WHEN PFRMD N/A 04/30/2018    Procedure: COLONOSCOPY, FLEXIBLE, PROXIMAL TO SPLENIC FLEXURE; DIAGNOSTIC, W/WO COLLECTION  SPECIMEN BY BRUSH OR WASH;  Surgeon: Luanne Bras, MD;  Location: [facility] MOB GI PROCEDURES [facility];  Service: Gastroenterology    PR COLONOSCOPY W/BIOPSY SINGLE/MULTIPLE N/A 04/30/2018    Procedure: COLONOSCOPY, FLEXIBLE, PROXIMAL TO SPLENIC FLEXURE; WITH BIOPSY, SINGLE OR MULTIPLE;  Surgeon: Luanne Bras, MD;  Location: [facility]OB GI PROCEDURES [facility];  Service: Gastroenterology    PR COLSC FLX W/RMVL OF TUMOR POLYP LESION SNARE TQ N/A 04/30/2018    Procedure: COLONOSCOPY FLEX; W/REMOV TUMOR/LES BY SNARE;  Surgeon: Luanne Bras, MD;  Location: [facility] GI PROCEDURES [facility];  Service: Gastroenterology    PR EXPLORATORY OF ABDOMEN Midline 04/14/2016 Procedure: EXPLORATORY LAPAROTOMY, EXPLORATORY CELIOTOMY WITH OR WITHOUT BIOPSY(S);  Surgeon: Leona Carry, MD;  Location: MAIN OR [facility];  Service: Transplant    PR EXPLORATORY OF ABDOMEN N/A 04/16/2016    Procedure: EXPLORATORY LAPAROTOMY, EXPLORATORY CELIOTOMY WITH OR WITHOUT BIOPSY(S);  Surgeon: Leona Carry, MD;  Location: MAIN OR [facility];  Service: Transplant    PR LAP, RADICAL NEPHRECTOMY Right 12/27/2016    Procedure: Robotic Xi Laparoscopy; Radical Nephrectomy (Incl Remove Gerota`S Fascia, Fatty Tissue, Reg Lymph Node, Adrenalectomy);  Surgeon: Tomie China, MD;  Location: MAIN OR [facility];  Service: Urology    PR NEGATIVE PRESSURE WOUND THERAPY DME >50 SQ CM N/A 04/12/2016    Procedure: NEG PRESS WOUND TX (VAC ASSIST) INCL TOPICALS, PER SESSION, TSA GREATER THAN/= 50 CM SQUARED;  Surgeon: Leona Carry, MD;  Location: MAIN OR [facility];  Service: Transplant    PR NEGATIVE PRESSURE WOUND THERAPY DME >50 SQ CM Bilateral 04/14/2016    Procedure: NEG PRESS WOUND TX (VAC ASSIST) INCL TOPICALS, PER SESSION, TSA GREATER THAN/= 50 CM SQUARED;  Surgeon: Leona Carry, MD;  Location: MAIN OR [facility];  Service: Transplant    PR NEGATIVE PRESSURE WOUND THERAPY DME >50 SQ CM N/A 04/16/2016    Procedure: NEG PRESS WOUND TX (VAC ASSIST) INCL TOPICALS, PER SESSION, TSA GREATER THAN/= 50 CM SQUARED;  Surgeon: Leona Carry, MD;  Location: MAIN OR [facility];  Service: Transplant    PR NEPHRECTOMY, W/PART. URETECTOMY Bilateral 04/11/2016    Procedure: LAPAROSCOPY, SURGICAL, NEPHRECTOMY WITH TOTAL URETERECTOMY;  Surgeon: Leona Carry, MD;  Location: MAIN OR [facility];  Service: Transplant    PR REMV KIDNEY,W/RIB RESECTION Bilateral 04/11/2016    Procedure: NEPHRECTOMY, INCLUDING PARTIAL URETERECTOMY, ANY OPEN APPROACH INCLUDING RIB RESECTION;  Surgeon: Leona Carry, MD;  Location: MAIN OR [facility];  Service: Transplant    PR TRANSPLANT,PREP LIVING  RENAL GRAFT N/A 06/22/2019 Procedure: BACKBENCH STD PREP LIVING DONOR RENAL ALLGRFT (OPEN/LAPROSC) PRIOR TO TRANSPLANT, INC DISSECT/REM AS NECESS;  Surgeon: Leona Carry, MD;  Location: MAIN OR [facility];  Service: Transplant    PR TRANSPLANTATION OF KIDNEY N/A 06/22/2019    Procedure: RENAL ALLOTRANSPLANTATION, IMPLANTATION OF GRAFT; WITHOUT RECIPIENT NEPHRECTOMY;  Surgeon: Leona Carry, MD;  Location: MAIN OR [facility];  Service: Transplant    SPLENECTOMY         Allergies  Patient has no known allergies.    Family History  Family History   Problem Relation Age of Onset    Kidney disease Mother     Cancer Father     Kidney disease Brother        Social History  Social History     Tobacco Use    Smoking status: Never    Smokeless tobacco: Never   Vaping Use    Vaping status: Never Used   Substance Use Topics  Alcohol use: No    Drug use: No        Physical Exam     VITAL SIGNS:      Vitals:    11/27/22 1639   BP: 183/67   Pulse: 61   Resp: 22   Temp: 36.7 ??C (98 ??F)   SpO2: 100%   Weight: 67.6 kg (149 lb)     Constitutional: Alert and oriented. No acute distress.  Eyes: Conjunctivae are normal.  HEENT: Normocephalic and atraumatic. Conjunctivae clear. No congestion. Moist mucous membranes. Superior lip swelling and abrasion, but no lacerations. There is a 1cm laceration that is hemostatic and well-approximated on the anterior surface of the inferior lip. No hemotympanum.   Cardiovascular: Rate as above, regular rhythm. Normal and symmetric distal pulses. Brisk capillary refill. Normal skin turgor. Good radial pulses. Fistula in the LUE with palpable thrill.  Respiratory: Normal respiratory effort. Breath sounds are normal. There are no wheezing or crackles heard.  Gastrointestinal: Soft, non-distended, non-tender.  Genitourinary: Deferred.  Musculoskeletal: Non-tender with normal range of motion in all extremities. There is TTP of the right posterior deltoid. There is a 4cm well-circumscribed mobile mass in the RUE. Full ROM of the right thumb. Posterior thorax unremarkable. No midline spinal tenderness.  Neurologic: Normal speech and language. No gross focal neurologic deficits are appreciated. Patient is moving all extremities equally, face is symmetric at rest and with speech.  Skin: Skin is warm and dry. No rash noted. There is a 3cm abrasion to the left wrist. Patient with a 1cm abrasion on the thenar eminence of the right hand, as well as 2cm abrasion on the proximal aspect of the thumb. Small abrasions to the superior patellas bilaterally.  Psychiatric: Mood and affect are normal. Speech and behavior are normal.     Radiology     CT head WO contrast    (Results Pending)   CT Cervical Spine Trauma    (Results Pending)   XR Shoulder 2 Views Right    (Results Pending)       Pertinent labs & imaging results that were available during my care of the patient were independently interpreted by me and considered in my medical decision making (see chart for details).    Portions of this record have been created using Scientist, clinical (histocompatibility and immunogenetics). Dictation errors have been sought, but may not have been identified and corrected.    Documentation assistance was provided by Johnathan Hausen, Scribe, on November 27, 2022 at 5:21 PM for Dorena Cookey, MD.    {*** NOTE TO PROVIDER: PLEASE ADD EDPROVSCRIBEATTEST NOTING YOU AGREE WITH SCRIBE DOCUMENTATION}

## 2022-11-29 ENCOUNTER — Encounter
Admit: 2022-11-29 | Discharge: 2022-11-30 | Payer: MEDICARE | Attending: Certified Registered" | Primary: Certified Registered"

## 2022-11-29 ENCOUNTER — Ambulatory Visit: Admit: 2022-11-29 | Discharge: 2022-11-30 | Payer: MEDICARE

## 2022-11-29 LAB — BASIC METABOLIC PANEL
ANION GAP: 6 mmol/L (ref 5–14)
BLOOD UREA NITROGEN: 27 mg/dL — ABNORMAL HIGH (ref 9–23)
BUN / CREAT RATIO: 17
CALCIUM: 10.3 mg/dL (ref 8.7–10.4)
CHLORIDE: 110 mmol/L — ABNORMAL HIGH (ref 98–107)
CO2: 24 mmol/L (ref 20.0–31.0)
CREATININE: 1.58 mg/dL — ABNORMAL HIGH
EGFR CKD-EPI (2021) MALE: 47 mL/min/{1.73_m2} — ABNORMAL LOW (ref >=60)
GLUCOSE RANDOM: 86 mg/dL (ref 70–99)
SODIUM: 140 mmol/L (ref 135–145)

## 2022-11-29 MED ORDER — OXYCODONE 5 MG TABLET
ORAL_TABLET | ORAL | 0 refills | 2.00000 days | Status: CP | PRN
Start: 2022-11-29 — End: 2022-12-04

## 2022-11-29 MED ADMIN — dexmedeTOMIDine (Precedex) 80 mcg/20 mL (4 mcg/mL) injection: INTRAVENOUS | Stop: 2022-11-29

## 2022-11-29 MED ADMIN — ePHEDrine (PF) 25 mg/5 mL (5 mg/mL) in 0.9% sodium chloride syringe Syrg: INTRAVENOUS | @ 23:00:00 | Stop: 2022-11-29

## 2022-11-29 MED ADMIN — lidocaine (XYLOCAINE) 20 mg/mL (2 %) injection: INTRAVENOUS | @ 22:00:00 | Stop: 2022-11-29

## 2022-11-29 MED ADMIN — ceFAZolin (ANCEF) IVPB 2 g in 50 ml dextrose (premix): 2 g | INTRAVENOUS | @ 23:00:00 | Stop: 2022-11-29

## 2022-11-29 MED ADMIN — ePHEDrine (PF) 25 mg/5 mL (5 mg/mL) in 0.9% sodium chloride syringe Syrg: INTRAVENOUS | Stop: 2022-11-29

## 2022-11-29 MED ADMIN — fentaNYL (PF) (SUBLIMAZE) injection: INTRAVENOUS | @ 19:00:00 | Stop: 2022-11-29

## 2022-11-29 MED ADMIN — Propofol (DIPRIVAN) injection: INTRAVENOUS | @ 23:00:00 | Stop: 2022-11-29

## 2022-11-29 MED ADMIN — Propofol (DIPRIVAN) injection: INTRAVENOUS | Stop: 2022-11-29

## 2022-11-29 MED ADMIN — bupivacaine (PF) (MARCAINE) 0.25 % (2.5 mg/mL) injection (PF): PERINEURAL | @ 19:00:00 | Stop: 2022-11-29

## 2022-11-29 MED ADMIN — ondansetron (ZOFRAN) injection: INTRAVENOUS | Stop: 2022-11-29

## 2022-11-29 MED ADMIN — heparin 1,000 units/500 mL (2 units/mL) in 0.9% NaCl infusion: @ 23:00:00 | Stop: 2022-11-29

## 2022-11-29 MED ADMIN — Propofol (DIPRIVAN) injection: INTRAVENOUS | @ 22:00:00 | Stop: 2022-11-29

## 2022-11-29 MED ADMIN — fentaNYL (PF) (SUBLIMAZE) injection: INTRAVENOUS | Stop: 2022-11-29

## 2022-11-29 MED ADMIN — lactated Ringers infusion: 10 mL/h | INTRAVENOUS | @ 22:00:00

## 2022-11-29 MED ADMIN — ropivacaine (NAROPIN) 5 mg/mL (0.5 %) injection: PERINEURAL | @ 19:00:00 | Stop: 2022-11-29

## 2022-11-29 MED ADMIN — fentaNYL (PF) (SUBLIMAZE) injection: INTRAVENOUS | @ 23:00:00 | Stop: 2022-11-29

## 2022-11-29 NOTE — Unmapped (Signed)
Nerve Block Discharge Instructions    General Considerations  Avoid placing cold, hot, hard or sharp items on your numb body part.  Also be careful to not rest your extremity on hard or hot/cold surfaces.  If you received a cooling device after surgery it is okay to use it.  Be careful not to bang or bump the numb body part.  Try to keep it in a neutral, comfortable position.  Elevate the limb; this will help decrease swelling and help increase comfort when the block wears off.  You may regain some movement or feeling, or experience some breakthrough pain, when the initial strong numbing medicine wears off.  This is normal.  Take your prescribed pain medicine as soon as you start feeling any discomfort in the extremity.  You should take your prescribed pain medicine BEFORE you go to bed, even if you are not experiencing discomfort.  If you have any questions or issues regarding the nerve block please call the anesthesia  resident on call at [phone]    Upper Extremity Nerve Blocks  Be aware of the position of your arm and protect it from compression or other injury.  Ensure that your entire arm is supported, including the wrist.  Do not let the wrist dangle over the sling.  There are some possible side effects that you may experience with your particular block.  These side effects will resolve as your block wears off.   - hoarse voice - slight redness of the eye   - sagging eyelid - some mild shortness of breath (especially if lying down)   - smaller pupil    Lower Extremity Nerve Blocks  Be aware of the position of your leg and protect it from compression or other injury.  Pad the knee, ankle and heel.  Do NOT bear any weight on the numb extremity until you completely recover the feeling and full muscle strength.  Always have assistance when getting up and moving about.    You may have received Exparel (R) - long acting numbing medicine - in your nerve block:  Your nerve block may last longer than 24-72 hours, this is not unusual   Your nerve block may feel like it has worn off, and then the numbness has returned - this is the short acting medicine stopping and the longer acting medicine beginning  If you have received Exparel (R), you should not have any additional numbing medicine for the next three days. Please inform any medical professionals that you received Exparel (R) before receiving additional medications for pain.     Follow up Questionnaire:  A member of the regional anesthesia team will be contacting you in a day or two for follow up on your block.  Listed below are the questions they will be asking you.  Please use this sheet to help you remember the time sequence of the following events.    What time did you start having pain as the block wore off? _______________  What time were you first able to move your fingers/toes? ________________  Are you still experiencing any numbness in your blocked extremity? ________

## 2022-11-30 MED ADMIN — phenylephrine 0.8 mg/10 mL (80 mcg/mL) injection: INTRAVENOUS | Stop: 2022-11-29

## 2022-11-30 NOTE — Unmapped (Signed)
Brief Operative Note  (CSN: [id number])        Date of Surgery: 11/29/2022     Pre-op Diagnosis: esrd     Post-op Diagnosis: same     Procedure(s):  LIGATION OR BANDING OF ANGIOACCESS ARTERIOVENOUS FISTULA: 98119 (CPT??)  Note: Revisions to procedures should be made in chart - see Procedures activity.     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  Incision elliptical over cannulation zone nand thin skin.  AVF body freed and branches ligated/divided 2-0 silk ties.  Vascular clamp placed 1cm from origin and AVF divided here; similarly clamp placed proximal to cannulation zone and divided and AVF aneurysmal segment removed.  Origin sewn closed with 5-0 prolene running and 0 silk tie and upper end a 0 silk stick tie.  3-0 vicryl in dermis and lower incision 4-0 monocryl and upper incision staples.         Surgeon Notes: I was present and scrubbed for the entire procedure, all counts correct     Purcell Mouton, MD   Date: 11/29/2022  Time: 8:33 PM

## 2022-11-30 NOTE — Unmapped (Signed)
Brief Operative Note  (CSN: [id number])      Date of Surgery: 11/29/2022    Pre-op Diagnosis: esrd    Post-op Diagnosis: same    Procedure(s):  LIGATION OR BANDING OF ANGIOACCESS ARTERIOVENOUS FISTULA: 98119 (CPT??)  Note: Revisions to procedures should be made in chart - see Procedures activity.    Performing Service: Transplant  Surgeons and Role:     * Azalia Neuberger, Lilyan Punt, MD - Primary     * Mostellar, Wynn Banker, MD - Resident - Assisting    Assistant: None    Findings: aneurysmal AVF with hypopigmented skin new , and increased size  Ligation nand resetion AVF      Anesthesia: Preop Block + Monitor Anesthesia Care    Estimated Blood Loss: 25cc    Complications: None    Specimens: None collected    Implants: * No implants in log *    Surgeon Notes: I was present and scrubbed for the entire procedure    Purcell Mouton, MD   Date: 11/29/2022  Time: 8:33 PM

## 2022-12-01 MED ORDER — METFORMIN ER 500 MG TABLET,EXTENDED RELEASE 24 HR
ORAL_TABLET | Freq: Every day | ORAL | 3 refills | 90.00000 days
Start: 2022-12-01 — End: ?

## 2022-12-01 NOTE — Unmapped (Signed)
[facility] Refill Coordination Note    Specialty Medication(s) to be Shipped:   Transplant: Envarsus 4mg     Other medication(s) to be shipped:  jardiance , mag-ox and metformin      Frank Leach, DOB: Jan 06, 1951  Phone: [phone] (home)       All above HIPAA information was verified with patient.     Was a Nurse, learning disability used for this call? No    Completed refill call assessment today to schedule patient's medication shipment from the [facility] [phone]).  All relevant notes have been reviewed.     Specialty medication(s) and dose(s) confirmed: Regimen is correct and unchanged.   Changes to medications: Kalum reports no changes at this time.  Changes to insurance: No  New side effects reported not previously addressed with a pharmacist or physician: None reported  Questions for the pharmacist: No    Confirmed patient received a Conservation officer, historic buildings and a Surveyor, mining with first shipment. The patient will receive a drug information handout for each medication shipped and additional FDA Medication Guides as required.       DISEASE/MEDICATION-SPECIFIC INFORMATION        N/A    SPECIALTY MEDICATION ADHERENCE     Medication Adherence    Patient reported X missed doses in the last month: 0  Specialty Medication: tacrolimus: ENVARSUS XR 4 mg Tb24 extended release tablet  Patient is on additional specialty medications: No              Were doses missed due to medication being on hold? No    Envarsus 4 mg: 5 days of medicine on hand        REFERRAL TO PHARMACIST     Referral to the pharmacist: Not needed      [facility]     Shipping address confirmed in Epic.       Delivery Scheduled: Yes, Expected medication delivery date: 12/05/22.     Medication will be delivered via UPS to the prescription address in Epic WAM.    [name]   [facility] Specialty Technician

## 2022-12-04 MED FILL — ENVARSUS XR 4 MG TABLET,EXTENDED RELEASE: ORAL | 30 days supply | Qty: 30 | Fill #10

## 2022-12-04 MED FILL — JARDIANCE 25 MG TABLET: ORAL | 30 days supply | Qty: 30 | Fill #3

## 2022-12-04 MED FILL — MG-PLUS-PROTEIN 133 MG TABLET: ORAL | 50 days supply | Qty: 100 | Fill #4

## 2022-12-06 ENCOUNTER — Ambulatory Visit: Admit: 2022-12-06 | Discharge: 2022-12-07 | Payer: MEDICARE

## 2022-12-06 LAB — CBC W/ AUTO DIFF
BASOPHILS ABSOLUTE COUNT: 0.1 10*9/L (ref 0.0–0.1)
BASOPHILS RELATIVE PERCENT: 0.9 %
EOSINOPHILS ABSOLUTE COUNT: 0.2 10*9/L (ref 0.0–0.5)
EOSINOPHILS RELATIVE PERCENT: 3.7 %
HEMATOCRIT: 36.8 % — ABNORMAL LOW (ref 39.0–48.0)
HEMOGLOBIN: 12 g/dL — ABNORMAL LOW (ref 12.9–16.5)
LYMPHOCYTES ABSOLUTE COUNT: 0.8 10*9/L — ABNORMAL LOW (ref 1.1–3.6)
LYMPHOCYTES RELATIVE PERCENT: 13.5 %
MEAN CORPUSCULAR HEMOGLOBIN CONC: 32.6 g/dL (ref 32.0–36.0)
MEAN CORPUSCULAR HEMOGLOBIN: 30.1 pg (ref 25.9–32.4)
MEAN CORPUSCULAR VOLUME: 92.2 fL (ref 77.6–95.7)
MEAN PLATELET VOLUME: 7.7 fL (ref 6.8–10.7)
MONOCYTES ABSOLUTE COUNT: 1 10*9/L — ABNORMAL HIGH (ref 0.3–0.8)
MONOCYTES RELATIVE PERCENT: 15.9 %
NEUTROPHILS ABSOLUTE COUNT: 3.9 10*9/L (ref 1.8–7.8)
NEUTROPHILS RELATIVE PERCENT: 66 %
NUCLEATED RED BLOOD CELLS: 0 /100{WBCs} (ref <=4)
PLATELET COUNT: 230 10*9/L (ref 150–450)
RED BLOOD CELL COUNT: 4 10*12/L — ABNORMAL LOW (ref 4.26–5.60)
RED CELL DISTRIBUTION WIDTH: 15 % (ref 12.2–15.2)
WBC ADJUSTED: 6 10*9/L (ref 3.6–11.2)

## 2022-12-06 LAB — MAGNESIUM: MAGNESIUM: 1.6 mg/dL (ref 1.6–2.6)

## 2022-12-06 LAB — TACROLIMUS LEVEL, TROUGH: TACROLIMUS, TROUGH: 4 ng/mL — ABNORMAL LOW (ref 5.0–15.0)

## 2022-12-06 LAB — RENAL FUNCTION PANEL
ALBUMIN: 3.8 g/dL (ref 3.4–5.0)
ANION GAP: 7 mmol/L (ref 5–14)
BLOOD UREA NITROGEN: 38 mg/dL — ABNORMAL HIGH (ref 9–23)
BUN / CREAT RATIO: 21
CALCIUM: 10.1 mg/dL (ref 8.7–10.4)
CHLORIDE: 108 mmol/L — ABNORMAL HIGH (ref 98–107)
CO2: 24.2 mmol/L (ref 20.0–31.0)
CREATININE: 1.82 mg/dL — ABNORMAL HIGH
EGFR CKD-EPI (2021) MALE: 39 mL/min/{1.73_m2} — ABNORMAL LOW (ref >=60)
GLUCOSE RANDOM: 85 mg/dL (ref 70–179)
PHOSPHORUS: 3.2 mg/dL (ref 2.4–5.1)
POTASSIUM: 5.2 mmol/L — ABNORMAL HIGH (ref 3.4–4.8)
SODIUM: 139 mmol/L (ref 135–145)

## 2022-12-07 LAB — CMV DNA, QUANTITATIVE, PCR
CMV QUANT LOG(10): 1.89 {Log_IU}/mL — ABNORMAL HIGH (ref <0.00)
CMV QUANT: 77 [IU]/mL — ABNORMAL HIGH (ref <0)
CMV VIRAL LD: DETECTED — AB

## 2022-12-07 MED ORDER — METFORMIN ER 500 MG TABLET,EXTENDED RELEASE 24 HR
ORAL_TABLET | Freq: Every day | ORAL | 3 refills | 90 days | Status: CP
Start: 2022-12-07 — End: ?
  Filled 2022-12-08: qty 180, 90d supply, fill #0

## 2022-12-18 NOTE — Unmapped (Signed)
[facility] Refill Coordination Note    Specialty Medication(s) to be Shipped:   Transplant: Envarsus 1mg  and mycophenolate mofetil 180mg     Other medication(s) to be shipped: No additional medications requested for fill at this time     Frank Leach, DOB: [birth year]  Phone: [phone] (home)       All above HIPAA information was verified with patient.     Was a Nurse, learning disability used for this call? No    Completed refill call assessment today to schedule patient's medication shipment from the [facility] [phone]).  All relevant notes have been reviewed.     Specialty medication(s) and dose(s) confirmed: Regimen is correct and unchanged.   Changes to medications: Frank Leach reports no changes at this time.  Changes to insurance: No  New side effects reported not previously addressed with a pharmacist or physician: None reported  Questions for the pharmacist: No    Confirmed patient received a Conservation officer, historic buildings and a Surveyor, mining with first shipment. The patient will receive a drug information handout for each medication shipped and additional FDA Medication Guides as required.       DISEASE/MEDICATION-SPECIFIC INFORMATION        N/A    SPECIALTY MEDICATION ADHERENCE     Medication Adherence    Patient reported X missed doses in the last month: 0  Specialty Medication: ENVARSUS XR 1 mg Tb24 extended release tablet (tacrolimus)  Patient is on additional specialty medications: Yes  Additional Specialty Medications: mycophenolate 180 MG EC tablet (MYFORTIC)  Patient Reported Additional Medication X Missed Doses in the Last Month: 0  Patient is on more than two specialty medications: No              Were doses missed due to medication being on hold? No    Envarsus XR 1 mg: ~7 days of medicine on hand   Mycophenolate 180 mg: ~7 days of medicine on hand       REFERRAL TO PHARMACIST     Referral to the pharmacist: Not needed      [facility]     Shipping address confirmed in Epic.       Delivery Scheduled: Yes, Expected medication delivery date: 12/21/22.     Medication will be delivered via Next Day Courier to the prescription address in [place].    Frank Leach Vangie Bicker, PharmD   [facility] Specialty Pharmacist

## 2022-12-20 MED FILL — ENVARSUS XR 1 MG TABLET,EXTENDED RELEASE: ORAL | 30 days supply | Qty: 60 | Fill #4

## 2022-12-20 MED FILL — MYCOPHENOLATE SODIUM 180 MG TABLET,DELAYED RELEASE: ORAL | 45 days supply | Qty: 180 | Fill #2

## 2022-12-21 ENCOUNTER — Ambulatory Visit: Admit: 2022-12-21 | Discharge: 2022-12-22 | Payer: MEDICARE | Attending: Family | Primary: Family

## 2022-12-25 NOTE — Unmapped (Addendum)
VASCULAR ACCESS CLINIC    Assessment/Plan  Frank Leach presents to clinic for wound check. His staples were removed and steri strips were placed x 1 week,  patient to remove. He denies any complaints. Graft function has remained stable. He should RTC PRN.      HPI / Subjective  Frank Leach is a 72 y.o. male who presents for vascular access post op check. He had his LUE AVF ligated with Dr. Norma Fredrickson on 4/24. His course has been uncomplicated.  His incision is healing well no erythema or drainage. Staples intact      Allergies  Patient has no known allergies.    Medications    Current Outpatient Medications   Medication Sig Dispense Refill    acetaminophen (TYLENOL) 500 MG tablet Take 1-2 tablets (500-1,000 mg total) by mouth every six (6) hours as needed for pain or fever (> 38C). 100 tablet 0    allopurinol (ZYLOPRIM) 100 MG tablet Take 1 tablet (100 mg total) by mouth every evening. 90 tablet 3    atorvastatin (LIPITOR) 40 MG tablet Take 1 tablet (40 mg total) by mouth daily. 90 tablet 3    empagliflozin (JARDIANCE) 25 mg tablet Take 1 tablet (25 mg total) by mouth daily. 30 tablet 11    ENVARSUS XR 1 mg Tb24 extended release tablet Take 2 tablets (2 mg total) by mouth in the morning. Take 2 tablets ( 2 mg total) in addition to 4 mg tablet for a total dose of 6 mg daily. 60 tablet 11    ENVARSUS XR 4 mg Tb24 extended release tablet Take 1 tablet (4 mg total) by mouth daily. Take 1 tablet ( 4 mg total) in addition to 1 mg tablets for  Total daily dose of 6 mg 30 tablet 11    magnesium oxide-Mg AA chelate (MAGNESIUM, AMINO ACID CHELATE,) 133 mg Take 1 tablet by mouth two (2) times a day. 100 tablet 11    metFORMIN (GLUCOPHAGE-XR) 500 MG 24 hr tablet Take 1 tablet (500 mg total) by mouth in the morning. 180 tablet 3    metFORMIN (GLUCOPHAGE-XR) 500 MG 24 hr tablet Take 2 tablets (1,000 mg total) by mouth daily. 180 tablet 3    mycophenolate (MYFORTIC) 180 MG EC tablet Take 2 tablets (360 mg total) by mouth Two (2) times a day. 180 tablet 3    olmesartan (BENICAR) 40 MG tablet Take 1 tablet (40 mg total) by mouth daily. 90 tablet 3    oxyCODONE (ROXICODONE) 5 MG immediate release tablet Take 1 tablet (5 mg total) by mouth every four (4) hours as needed for pain. 10 tablet 0     No current facility-administered medications for this visit.       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)     ESRD on dialysis (CMS-HCC)     Gout     Hyperlipidemia     Hypertension        Past Surgical History  Past Surgical History:   Procedure Laterality Date    AV FISTULA PLACEMENT      PR COLONOSCOPY FLX DX W/COLLJ SPEC WHEN PFRMD N/A 04/30/2018    Procedure: COLONOSCOPY, FLEXIBLE, PROXIMAL TO SPLENIC  FLEXURE; DIAGNOSTIC, W/WO COLLECTION SPECIMEN BY BRUSH OR WASH;  Surgeon: Luanne Bras, MD;  Location: [facility];  Service: Gastroenterology    PR COLONOSCOPY W/BIOPSY SINGLE/MULTIPLE N/A 04/30/2018    Procedure: COLONOSCOPY, FLEXIBLE, PROXIMAL TO SPLENIC FLEXURE; WITH BIOPSY, SINGLE OR MULTIPLE;  Surgeon: Luanne Bras, MD;  Location: [facility];  Service: Gastroenterology    PR COLSC FLX W/RMVL OF TUMOR POLYP LESION SNARE TQ N/A 04/30/2018    Procedure: COLONOSCOPY FLEX; W/REMOV TUMOR/LES BY SNARE;  Surgeon: Luanne Bras, MD;  Location: [facility];  Service: Gastroenterology    PR EXPLORATORY OF ABDOMEN Midline 04/14/2016    Procedure: EXPLORATORY LAPAROTOMY, EXPLORATORY CELIOTOMY WITH OR WITHOUT BIOPSY(S);  Surgeon: Leona Carry, MD;  Location: MAIN OR [facility];  Service: Transplant    PR EXPLORATORY OF ABDOMEN N/A 04/16/2016    Procedure: EXPLORATORY LAPAROTOMY, EXPLORATORY CELIOTOMY WITH OR WITHOUT BIOPSY(S);  Surgeon: Leona Carry, MD;  Location: [facility];  Service: Transplant    PR LAP, RADICAL NEPHRECTOMY Right 12/27/2016    Procedure: Robotic Xi Laparoscopy; Radical Nephrectomy (Incl Remove Gerota`S Fascia, Fatty Tissue, Reg Lymph Node, Adrenalectomy);  Surgeon: Tomie China, MD;  Location: [facility] OR [facility];  Service: Urology    PR LIGATN ANGIOACCESS AV FISTULA Left 11/29/2022    Procedure: LIGATION OR BANDING OF ANGIOACCESS ARTERIOVENOUS FISTULA;  Surgeon: Toledo, Lilyan Punt, MD;  Location: MAIN OR [place];  Service: Transplant    PR NEGATIVE PRESSURE WOUND THERAPY DME >50 SQ CM N/A 04/12/2016    Procedure: NEG PRESS WOUND TX (VAC ASSIST) INCL TOPICALS, PER SESSION, TSA GREATER THAN/= 50 CM SQUARED;  Surgeon: Leona Carry, MD;  Location: MAIN OR [place];  Service: Transplant    PR NEGATIVE PRESSURE WOUND THERAPY DME >50 SQ CM Bilateral 04/14/2016    Procedure: NEG PRESS WOUND TX (VAC ASSIST) INCL TOPICALS, PER SESSION, TSA GREATER THAN/= 50 CM SQUARED;  Surgeon: Leona Carry, MD;  Location: MAIN OR [place];  Service: Transplant    PR NEGATIVE PRESSURE WOUND THERAPY DME >50 SQ CM N/A 04/16/2016    Procedure: NEG PRESS WOUND TX (VAC ASSIST) INCL TOPICALS, PER SESSION, TSA GREATER THAN/= 50 CM SQUARED;  Surgeon: Leona Carry, MD;  Location: MAIN OR [facility];  Service: Transplant    PR NEPHRECTOMY, W/PART. URETECTOMY Bilateral 04/11/2016    Procedure: LAPAROSCOPY, SURGICAL, NEPHRECTOMY WITH TOTAL URETERECTOMY;  Surgeon: Leona Carry, MD;  Location: MAIN OR [facility];  Service: Transplant    PR REMV KIDNEY,W/RIB RESECTION Bilateral 04/11/2016    Procedure: NEPHRECTOMY, INCLUDING PARTIAL URETERECTOMY, ANY OPEN APPROACH INCLUDING RIB RESECTION;  Surgeon: Leona Carry, MD;  Location: MAIN OR [facility];  Service: Transplant    PR TRANSPLANT,PREP LIVING  RENAL GRAFT N/A 06/22/2019    Procedure: BACKBENCH STD PREP LIVING DONOR RENAL ALLGRFT (OPEN/LAPROSC) PRIOR TO TRANSPLANT, INC DISSECT/REM AS NECESS;  Surgeon: Leona Carry, MD;  Location: MAIN OR [facility];  Service: Transplant    PR TRANSPLANTATION OF KIDNEY N/A 06/22/2019    Procedure: RENAL ALLOTRANSPLANTATION, IMPLANTATION OF GRAFT; WITHOUT RECIPIENT NEPHRECTOMY;  Surgeon: Leona Carry, MD;  Location: MAIN OR [facility];  Service: Transplant    SPLENECTOMY         Family History  The patient's family history includes Cancer in his father; Kidney disease in his brother and mother..    Social History  Tobacco use: denies    Review of Systems  A 12 system review of systems was  negative except as noted in HPI    Objective  Vitals: Blood pressure 163/85, pulse 59, temperature 36.3 ??C (97.4 ??F), temperature source Tympanic, height 170.2 cm (5' 7.01), weight 68 kg (149 lb 14.4 oz). Body mass index is 23.47 kg/m??.  General: pleasant, cooperative, no acute distress, sitting in chair comfortably  Lungs: unlabored breathing, stable on room air   Heart: reg rate,  HDS, hypertensive  Neuro: A&Ox3  Ext: no edema, well perfused.       Test Results  Lab Results   Component Value Date    WBC 6.0 12/06/2022    HGB 12.0 (L) 12/06/2022    HCT 36.8 (L) 12/06/2022    PLT 230 12/06/2022     Lab Results   Component Value Date    NA 139 12/06/2022    K 5.2 (H) 12/06/2022    CL 108 (H) 12/06/2022    CO2 24.2 12/06/2022    BUN 38 (H) 12/06/2022    CREATININE 1.82 (H) 12/06/2022    CALCIUM 10.1 12/06/2022    MG 1.6 12/06/2022    PHOS 3.2 12/06/2022     Lab Results   Component Value Date    INR 1.06 01/19/2022    APTT 27.9 01/19/2022     B POS

## 2022-12-28 NOTE — Unmapped (Signed)
[facility] Refill Coordination Note    Specialty Medication(s) to be Shipped:   Transplant: Envarsus 4mg     Other medication(s) to be shipped:  olmesartan , allopurinol and [address] Frank Leach, DOB: 1950-11-24  Phone: [phone] (home)       All above HIPAA information was verified with patient.     Was a Nurse, learning disability used for this call? No    Completed refill call assessment today to schedule patient's medication shipment from the [facility] [phone]).  All relevant notes have been reviewed.     Specialty medication(s) and dose(s) confirmed: Regimen is correct and unchanged.   Changes to medications: Frank Leach reports no changes at this time.  Changes to insurance: No  New side effects reported not previously addressed with a pharmacist or physician: None reported  Questions for the pharmacist: No    Confirmed patient received a Conservation officer, historic buildings and a Surveyor, mining with first shipment. The patient will receive a drug information handout for each medication shipped and additional FDA Medication Guides as required.       DISEASE/MEDICATION-SPECIFIC INFORMATION        N/A    SPECIALTY MEDICATION ADHERENCE     Medication Adherence    Patient reported X missed doses in the last month: 0  Specialty Medication: ENVARSUS XR 4 mg Tb24 extended release tablet (tacrolimus)  Patient is on additional specialty medications: No              Were doses missed due to medication being on hold? No    Envarsus 4 mg: 10 days of medicine on hand       REFERRAL TO PHARMACIST     Referral to the pharmacist: Not needed      [facility]     Shipping address confirmed in Epic.       Delivery Scheduled: Yes, Expected medication delivery date: 01/03/23.     Medication will be delivered via Next Day Courier to the prescription address in Epic [place].    [name]   [facility] Specialty Technician

## 2023-01-01 MED FILL — JARDIANCE 25 MG TABLET: ORAL | 30 days supply | Qty: 30 | Fill #4

## 2023-01-01 MED FILL — OLMESARTAN 40 MG TABLET: ORAL | 90 days supply | Qty: 90 | Fill #1

## 2023-01-01 MED FILL — ALLOPURINOL 100 MG TABLET: ORAL | 90 days supply | Qty: 90 | Fill #2

## 2023-01-01 MED FILL — ENVARSUS XR 4 MG TABLET,EXTENDED RELEASE: ORAL | 30 days supply | Qty: 30 | Fill #11

## 2023-01-08 ENCOUNTER — Ambulatory Visit: Admit: 2023-01-08 | Discharge: 2023-01-09 | Payer: MEDICARE

## 2023-01-08 LAB — CBC W/ AUTO DIFF
BASOPHILS ABSOLUTE COUNT: 0.1 10*9/L (ref 0.0–0.1)
BASOPHILS RELATIVE PERCENT: 1.3 %
EOSINOPHILS ABSOLUTE COUNT: 0.2 10*9/L (ref 0.0–0.5)
EOSINOPHILS RELATIVE PERCENT: 4.1 %
HEMATOCRIT: 38 % — ABNORMAL LOW (ref 39.0–48.0)
HEMOGLOBIN: 12.3 g/dL — ABNORMAL LOW (ref 12.9–16.5)
LYMPHOCYTES ABSOLUTE COUNT: 1 10*9/L — ABNORMAL LOW (ref 1.1–3.6)
LYMPHOCYTES RELATIVE PERCENT: 19.7 %
MEAN CORPUSCULAR HEMOGLOBIN CONC: 32.4 g/dL (ref 32.0–36.0)
MEAN CORPUSCULAR HEMOGLOBIN: 29.9 pg (ref 25.9–32.4)
MEAN CORPUSCULAR VOLUME: 92.1 fL (ref 77.6–95.7)
MEAN PLATELET VOLUME: 7.6 fL (ref 6.8–10.7)
MONOCYTES ABSOLUTE COUNT: 0.8 10*9/L (ref 0.3–0.8)
MONOCYTES RELATIVE PERCENT: 16.8 %
NEUTROPHILS ABSOLUTE COUNT: 2.9 10*9/L (ref 1.8–7.8)
NEUTROPHILS RELATIVE PERCENT: 58.1 %
NUCLEATED RED BLOOD CELLS: 0 /100{WBCs} (ref <=4)
PLATELET COUNT: 237 10*9/L (ref 150–450)
RED BLOOD CELL COUNT: 4.13 10*12/L — ABNORMAL LOW (ref 4.26–5.60)
RED CELL DISTRIBUTION WIDTH: 15 % (ref 12.2–15.2)
WBC ADJUSTED: 5 10*9/L (ref 3.6–11.2)

## 2023-01-08 LAB — RENAL FUNCTION PANEL
ALBUMIN: 3.9 g/dL (ref 3.4–5.0)
ANION GAP: 5 mmol/L (ref 5–14)
BLOOD UREA NITROGEN: 30 mg/dL — ABNORMAL HIGH (ref 9–23)
BUN / CREAT RATIO: 17
CALCIUM: 10.2 mg/dL (ref 8.7–10.4)
CHLORIDE: 113 mmol/L — ABNORMAL HIGH (ref 98–107)
CO2: 23.5 mmol/L (ref 20.0–31.0)
CREATININE: 1.77 mg/dL — ABNORMAL HIGH
EGFR CKD-EPI (2021) MALE: 41 mL/min/{1.73_m2} — ABNORMAL LOW (ref >=60)
GLUCOSE RANDOM: 90 mg/dL (ref 70–179)
PHOSPHORUS: 2.8 mg/dL (ref 2.4–5.1)
POTASSIUM: 4.9 mmol/L — ABNORMAL HIGH (ref 3.4–4.8)
SODIUM: 141 mmol/L (ref 135–145)

## 2023-01-08 LAB — MAGNESIUM: MAGNESIUM: 1.6 mg/dL (ref 1.6–2.6)

## 2023-01-08 LAB — TACROLIMUS LEVEL, TROUGH: TACROLIMUS, TROUGH: 5.4 ng/mL (ref 5.0–15.0)

## 2023-01-09 LAB — CMV DNA, QUANTITATIVE, PCR
CMV QUANT: <35 [IU]/mL — ABNORMAL HIGH (ref <0)
CMV VIRAL LD: DETECTED — AB

## 2023-01-25 DIAGNOSIS — Z940 Kidney transplant status: Principal | ICD-10-CM

## 2023-01-25 DIAGNOSIS — Z79899 Other long term (current) drug therapy: Principal | ICD-10-CM

## 2023-01-25 DIAGNOSIS — I10 Essential (primary) hypertension: Principal | ICD-10-CM

## 2023-01-25 MED ORDER — ENVARSUS XR 4 MG TABLET,EXTENDED RELEASE
ORAL_TABLET | Freq: Every day | ORAL | 11 refills | 30 days | Status: CP
Start: 2023-01-25 — End: ?

## 2023-01-25 NOTE — Unmapped (Signed)
[facility] Refill Coordination Note    Specialty Medication(s) to be Shipped:   Transplant: Envarsus XR 4 mg, Envarsus XR 1 mg (declined), and Mycophenolate (declined)    Other medication(s) to be shipped:  Jardiance, Magnesium, Atorvastatin     Frank Leach, DOB: 1950/09/14  Phone: [phone] (home)       All above HIPAA information was verified with patient.     Was a Nurse, learning disability used for this call? No    Completed refill call assessment today to schedule patient's medication shipment from the [facility] [phone]).  All relevant notes have been reviewed.     Specialty medication(s) and dose(s) confirmed: Regimen is correct and unchanged.   Changes to medications: Amin reports no changes at this time.  Changes to insurance: No  New side effects reported not previously addressed with a pharmacist or physician: None reported  Questions for the pharmacist: No    Confirmed patient received a Conservation officer, historic buildings and a Surveyor, mining with first shipment. The patient will receive a drug information handout for each medication shipped and additional FDA Medication Guides as required.       DISEASE/MEDICATION-SPECIFIC INFORMATION        N/A    SPECIALTY MEDICATION ADHERENCE     Medication Adherence    Patient reported X missed doses in the last month: 0  Specialty Medication: ENVARSUS XR 4 mg  Patient is on additional specialty medications: Yes  Additional Specialty Medications: ENVARSUS XR 1 mg   Patient Reported Additional Medication X Missed Doses in the Last Month: 0  Patient is on more than two specialty medications: Yes  Specialty Medication: mycophenolate 180 MG  Patient Reported Additional Medication X Missed Doses in the Last Month: 0              Were doses missed due to medication being on hold? No      Envarsus XR 4 mg: 8 days of medicine on hand        REFERRAL TO PHARMACIST     Referral to the pharmacist: Not needed      [facility]     Shipping address confirmed in Epic.       Delivery Scheduled: Yes, Expected medication delivery date: 01/30/23.  However, Rx request for refills was sent to the provider as there are none remaining.     Medication will be delivered via Next Day Courier to the prescription address in Epic WAM.    Willette Pa   [facility] Specialty Technician

## 2023-01-29 MED FILL — JARDIANCE 25 MG TABLET: ORAL | 30 days supply | Qty: 30 | Fill #5

## 2023-01-29 MED FILL — ATORVASTATIN 40 MG TABLET: ORAL | 90 days supply | Qty: 90 | Fill #2

## 2023-01-29 MED FILL — ENVARSUS XR 4 MG TABLET,EXTENDED RELEASE: ORAL | 30 days supply | Qty: 30 | Fill #0

## 2023-01-29 MED FILL — MG-PLUS-PROTEIN 133 MG TABLET: ORAL | 50 days supply | Qty: 100 | Fill #5

## 2023-02-05 ENCOUNTER — Ambulatory Visit: Admit: 2023-02-05 | Discharge: 2023-02-06 | Payer: MEDICARE

## 2023-02-05 LAB — RENAL FUNCTION PANEL
ALBUMIN: 3.9 g/dL (ref 3.4–5.0)
ANION GAP: 5 mmol/L (ref 5–14)
BLOOD UREA NITROGEN: 30 mg/dL — ABNORMAL HIGH (ref 9–23)
BUN / CREAT RATIO: 18
CALCIUM: 10 mg/dL (ref 8.7–10.4)
CHLORIDE: 114 mmol/L — ABNORMAL HIGH (ref 98–107)
CO2: 22.1 mmol/L (ref 20.0–31.0)
CREATININE: 1.66 mg/dL — ABNORMAL HIGH
EGFR CKD-EPI (2021) MALE: 44 mL/min/{1.73_m2} — ABNORMAL LOW (ref >=60)
GLUCOSE RANDOM: 92 mg/dL (ref 70–179)
PHOSPHORUS: 2.8 mg/dL (ref 2.4–5.1)
POTASSIUM: 4.7 mmol/L (ref 3.4–4.8)
SODIUM: 141 mmol/L (ref 135–145)

## 2023-02-05 LAB — CBC W/ AUTO DIFF
BASOPHILS ABSOLUTE COUNT: 0.1 10*9/L (ref 0.0–0.1)
BASOPHILS RELATIVE PERCENT: 1.3 %
EOSINOPHILS ABSOLUTE COUNT: 0.2 10*9/L (ref 0.0–0.5)
EOSINOPHILS RELATIVE PERCENT: 4.2 %
HEMATOCRIT: 37.5 % — ABNORMAL LOW (ref 39.0–48.0)
HEMOGLOBIN: 12.1 g/dL — ABNORMAL LOW (ref 12.9–16.5)
LYMPHOCYTES ABSOLUTE COUNT: 0.9 10*9/L — ABNORMAL LOW (ref 1.1–3.6)
LYMPHOCYTES RELATIVE PERCENT: 20.8 %
MEAN CORPUSCULAR HEMOGLOBIN CONC: 32.2 g/dL (ref 32.0–36.0)
MEAN CORPUSCULAR HEMOGLOBIN: 29.4 pg (ref 25.9–32.4)
MEAN CORPUSCULAR VOLUME: 91.3 fL (ref 77.6–95.7)
MEAN PLATELET VOLUME: 7.8 fL (ref 6.8–10.7)
MONOCYTES ABSOLUTE COUNT: 0.7 10*9/L (ref 0.3–0.8)
MONOCYTES RELATIVE PERCENT: 16.7 %
NEUTROPHILS ABSOLUTE COUNT: 2.5 10*9/L (ref 1.8–7.8)
NEUTROPHILS RELATIVE PERCENT: 57 %
NUCLEATED RED BLOOD CELLS: 0 /100{WBCs} (ref <=4)
PLATELET COUNT: 236 10*9/L (ref 150–450)
RED BLOOD CELL COUNT: 4.11 10*12/L — ABNORMAL LOW (ref 4.26–5.60)
RED CELL DISTRIBUTION WIDTH: 15.2 % (ref 12.2–15.2)
WBC ADJUSTED: 4.4 10*9/L (ref 3.6–11.2)

## 2023-02-05 LAB — CMV DNA, QUANTITATIVE, PCR
CMV QUANT: <35 [IU]/mL — ABNORMAL HIGH (ref <0)
CMV VIRAL LD: DETECTED — AB

## 2023-02-05 LAB — MAGNESIUM: MAGNESIUM: 1.5 mg/dL — ABNORMAL LOW (ref 1.6–2.6)

## 2023-02-05 LAB — TACROLIMUS LEVEL, TROUGH: TACROLIMUS, TROUGH: 5.8 ng/mL (ref 5.0–15.0)

## 2023-02-12 DIAGNOSIS — Z940 Kidney transplant status: Principal | ICD-10-CM

## 2023-02-12 DIAGNOSIS — Z794 Long term (current) use of insulin: Principal | ICD-10-CM

## 2023-02-12 DIAGNOSIS — Z79899 Other long term (current) drug therapy: Principal | ICD-10-CM

## 2023-02-12 DIAGNOSIS — E0801 Diabetes mellitus due to underlying condition with hyperosmolarity with coma: Principal | ICD-10-CM

## 2023-02-13 NOTE — Unmapped (Signed)
[facility] Refill Coordination Note    Specialty Medication(s) to be Shipped:   Transplant: mycophenolate mofetil 180 mg    Other medication(s) to be shipped: No additional medications requested for fill at this time     Frank Leach, DOB: [birth year]  Phone: [phone] (home)       All above HIPAA information was verified with patient.     Was a Nurse, learning disability used for this call? No    Completed refill call assessment today to schedule patient's medication shipment from the [facility] [phone]).  All relevant notes have been reviewed.     Specialty medication(s) and dose(s) confirmed: Regimen is correct and unchanged.   Changes to medications: Frank Leach reports no changes at this time.  Changes to insurance: No  New side effects reported not previously addressed with a pharmacist or physician: None reported  Questions for the pharmacist: No    Confirmed patient received a Conservation officer, historic buildings and a Surveyor, mining with first shipment. The patient will receive a drug information handout for each medication shipped and additional FDA Medication Guides as required.       DISEASE/MEDICATION-SPECIFIC INFORMATION        N/A    SPECIALTY MEDICATION ADHERENCE     Medication Adherence    Patient reported X missed doses in the last month: 0  Specialty Medication: mycophenolate 180 MG EC tablet (MYFORTIC)  Patient is on additional specialty medications: No              Were doses missed due to medication being on hold? No    mycophenolate 180 mg: 4 days of medicine on hand       REFERRAL TO PHARMACIST     Referral to the pharmacist: Not needed      [facility]     Shipping address confirmed in Epic.       Delivery Scheduled: Yes, Expected medication delivery date: 02/15/23.     Medication will be delivered via UPS to the prescription address in Epic WAM.    [name]   [facility] Specialty Technician

## 2023-02-14 ENCOUNTER — Ambulatory Visit: Admit: 2023-02-14 | Discharge: 2023-02-15 | Payer: MEDICARE | Attending: Nephrology | Primary: Nephrology

## 2023-02-14 ENCOUNTER — Ambulatory Visit: Admit: 2023-02-14 | Discharge: 2023-02-15 | Payer: MEDICARE

## 2023-02-14 DIAGNOSIS — B259 Cytomegaloviral disease, unspecified: Principal | ICD-10-CM

## 2023-02-14 DIAGNOSIS — Z79899 Other long term (current) drug therapy: Principal | ICD-10-CM

## 2023-02-14 DIAGNOSIS — E0801 Diabetes mellitus due to underlying condition with hyperosmolarity with coma: Principal | ICD-10-CM

## 2023-02-14 DIAGNOSIS — Z794 Long term (current) use of insulin: Principal | ICD-10-CM

## 2023-02-14 DIAGNOSIS — Z940 Kidney transplant status: Principal | ICD-10-CM

## 2023-02-14 DIAGNOSIS — D84821 Immunosuppression due to drug therapy (CMS-HCC): Principal | ICD-10-CM

## 2023-02-14 LAB — CBC W/ AUTO DIFF
BASOPHILS ABSOLUTE COUNT: 0.1 10*9/L (ref 0.0–0.1)
BASOPHILS RELATIVE PERCENT: 1.5 %
EOSINOPHILS ABSOLUTE COUNT: 0.2 10*9/L (ref 0.0–0.5)
EOSINOPHILS RELATIVE PERCENT: 4.4 %
HEMATOCRIT: 39.8 % (ref 39.0–48.0)
HEMOGLOBIN: 12.8 g/dL — ABNORMAL LOW (ref 12.9–16.5)
LYMPHOCYTES ABSOLUTE COUNT: 0.9 10*9/L — ABNORMAL LOW (ref 1.1–3.6)
LYMPHOCYTES RELATIVE PERCENT: 17.8 %
MEAN CORPUSCULAR HEMOGLOBIN CONC: 32.1 g/dL (ref 32.0–36.0)
MEAN CORPUSCULAR HEMOGLOBIN: 29.4 pg (ref 25.9–32.4)
MEAN CORPUSCULAR VOLUME: 91.4 fL (ref 77.6–95.7)
MEAN PLATELET VOLUME: 8.1 fL (ref 6.8–10.7)
MONOCYTES ABSOLUTE COUNT: 0.7 10*9/L (ref 0.3–0.8)
MONOCYTES RELATIVE PERCENT: 13.8 %
NEUTROPHILS ABSOLUTE COUNT: 3.2 10*9/L (ref 1.8–7.8)
NEUTROPHILS RELATIVE PERCENT: 62.5 %
PLATELET COUNT: 221 10*9/L (ref 150–450)
RED BLOOD CELL COUNT: 4.35 10*12/L (ref 4.26–5.60)
RED CELL DISTRIBUTION WIDTH: 15.1 % (ref 12.2–15.2)
WBC ADJUSTED: 5.2 10*9/L (ref 3.6–11.2)

## 2023-02-14 LAB — RENAL FUNCTION PANEL
ALBUMIN: 4.1 g/dL (ref 3.4–5.0)
ANION GAP: 6 mmol/L (ref 5–14)
BLOOD UREA NITROGEN: 31 mg/dL — ABNORMAL HIGH (ref 9–23)
BUN / CREAT RATIO: 18
CALCIUM: 10.3 mg/dL (ref 8.7–10.4)
CHLORIDE: 110 mmol/L — ABNORMAL HIGH (ref 98–107)
CO2: 23.4 mmol/L (ref 20.0–31.0)
CREATININE: 1.77 mg/dL — ABNORMAL HIGH
EGFR CKD-EPI (2021) MALE: 41 mL/min/{1.73_m2} — ABNORMAL LOW (ref >=60)
GLUCOSE RANDOM: 95 mg/dL (ref 70–99)
PHOSPHORUS: 2.4 mg/dL (ref 2.4–5.1)
POTASSIUM: 4.8 mmol/L (ref 3.4–4.8)
SODIUM: 139 mmol/L (ref 135–145)

## 2023-02-14 LAB — URINALYSIS WITH MICROSCOPY
BACTERIA: NONE SEEN /HPF
BILIRUBIN UA: NEGATIVE
BLOOD UA: NEGATIVE
GLUCOSE UA: >1000 — AB
KETONES UA: NEGATIVE
LEUKOCYTE ESTERASE UA: NEGATIVE
NITRITE UA: NEGATIVE
PH UA: 6 (ref 5.0–9.0)
PROTEIN UA: NEGATIVE
RBC UA: 1 /HPF (ref <3)
SPECIFIC GRAVITY UA: 1.01 (ref 1.005–1.030)
SQUAMOUS EPITHELIAL: 1 /HPF (ref 0–5)
UROBILINOGEN UA: 0.2
WBC UA: 1 /HPF (ref <2)

## 2023-02-14 LAB — PROTEIN / CREATININE RATIO, URINE
CREATININE, URINE: 68.1 mg/dL
PROTEIN URINE: 20.2 mg/dL
PROTEIN/CREAT RATIO, URINE: 0.297

## 2023-02-14 LAB — MAGNESIUM: MAGNESIUM: 1.5 mg/dL — ABNORMAL LOW (ref 1.6–2.6)

## 2023-02-14 LAB — LIPID PANEL
CHOLESTEROL/HDL RATIO SCREEN: 2.3 (ref 1.0–4.5)
CHOLESTEROL: 83 mg/dL (ref <=200)
HDL CHOLESTEROL: 36 mg/dL — ABNORMAL LOW (ref 40–60)
LDL CHOLESTEROL CALCULATED: 32 mg/dL — ABNORMAL LOW (ref 40–99)
NON-HDL CHOLESTEROL: 47 mg/dL — ABNORMAL LOW (ref 70–130)
TRIGLYCERIDES: 76 mg/dL (ref 0–150)
VLDL CHOLESTEROL CAL: 15.2 mg/dL (ref 12–42)

## 2023-02-14 LAB — HEMOGLOBIN A1C
ESTIMATED AVERAGE GLUCOSE: 123 mg/dL
HEMOGLOBIN A1C: 5.9 % — ABNORMAL HIGH (ref 4.8–5.6)

## 2023-02-14 LAB — TACROLIMUS LEVEL, TROUGH: TACROLIMUS, TROUGH: 5.3 ng/mL (ref 5.0–15.0)

## 2023-02-14 MED ORDER — AMLODIPINE 5 MG TABLET
ORAL | 3 refills | 90 days | Status: CP
Start: 2023-02-14 — End: 2024-02-14
  Filled 2023-02-20: qty 90, 90d supply, fill #0

## 2023-02-14 MED FILL — MYCOPHENOLATE SODIUM 180 MG TABLET,DELAYED RELEASE: ORAL | 45 days supply | Qty: 180 | Fill #3

## 2023-02-14 NOTE — Unmapped (Signed)
Transplant Nephrology Clinic Visit    History of Present Illness    72 y.o. male here for follow up after kidney transplantation.      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 Interval decrease in linear soft tissue within the right nephrectomy bed, potentially postsurgical or fat necrosis. Unchanged linear tissue within the left nephrectomy bed. No suspicious enhancing lesions are visualized within the surgical beds     Post-Txp 06/25/2019 (txp kidney only)  The renal transplant was located in the left lower quadrant. Normal size and echogenicity.  No solid masses or calculi. Trace perinephric fluid adjacent to the lower pole of the kidney. Mild pelviectasis  - Perfusion: Using power Doppler, normal perfusion was seen throughout the renal parenchyma.  - Resistive indices in the renal transplant are stable compared with prior examination.  - Main renal artery/iliac artery: Patent. Again noted are 3 renal arteries. Resistive indices within the renal arteries are stable to minimally increased, now at or just above normal limits.  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 He says that Dr. Norma Fredrickson wanted to keep an eye on it. I sent a message to Hughie Closs RN to get him scheduled for follow up    SBP 130-140s at home prior to meds in the AM. When he checks in the evening it is about the same.     No hospitalizations, no ED visits in interval since last visit  No chest pain, no shortness of breath at rest, no lower extremity edema, no nausea, no vomiting, no diarrhea, no pain swallowing, no tremors, no fevers, no chills, no skin changes.  No pain urinating, no trouble passing urine, no visible blood in urine.  The patient reports they are able to obtain and take their immunosuppressants without issues.              Review of Systems      Otherwise as per HPI, all other systems reviewed and are negative.    Past Medical History, Surgical History, Family History, and Social History reviewed in electronic record        Medications  Current Outpatient Medications   Medication Sig Dispense Refill    acetaminophen (TYLENOL) 500 MG tablet Take 1-2 tablets (500-1,000 mg total) by mouth every six (6) hours as needed for pain or fever (> 38C). 100 tablet 0    allopurinol (ZYLOPRIM) 100 MG tablet Take 1 tablet (100 mg total) by mouth every evening. 90 tablet 3    atorvastatin (LIPITOR) 40 MG tablet Take 1 tablet (40 mg total) by mouth daily. 90 tablet 3    empagliflozin (JARDIANCE) 25 mg tablet Take 1 tablet (25 mg total) by mouth daily. 30 tablet 11    ENVARSUS XR 4 mg Tb24  extended release tablet Take 1 tablet (4 mg total) by mouth daily. Take 1 tablet ( 4 mg total) in addition to 1 mg tablets for  Total daily dose of 6 mg 30 tablet 11    magnesium oxide-Mg AA chelate (MAGNESIUM, AMINO ACID CHELATE,) 133 mg Take 1 tablet by mouth two (2) times a day. 100 tablet 11    metFORMIN (GLUCOPHAGE-XR) 500 MG 24 hr tablet Take 1 tablet (500 mg total) by mouth in the morning. 180 tablet 3    metFORMIN (GLUCOPHAGE-XR) 500 MG 24 hr tablet Take 2 tablets (1,000 mg total) by mouth daily. 180 tablet 3    mycophenolate (MYFORTIC) 180 MG EC tablet Take 2 tablets (360 mg total) by mouth Two (2) times a day. 180 tablet 3    olmesartan (BENICAR) 40 MG tablet Take 1 tablet (40 mg total) by mouth daily. 90 tablet 3    oxyCODONE (ROXICODONE) 5 MG immediate release tablet Take 1 tablet (5 mg total) by mouth every four (4) hours as needed for pain. 10 tablet 0     No current facility-administered medications for this visit.           Physical Exam  BP 155/74 (BP Site: R Arm, BP Position: Sitting, BP Cuff Size: Medium)  - Pulse 55  - Temp 36.3 ??C (97.3 ??F) (Temporal)  - Wt 68.4 kg (150 lb 12.8 oz)  - BMI 23.61 kg/m??   General: no acute distress  HEENT: mucous membranes moist  Neck: neck supple, no cervical lymphadenopathy appreciated  CV: normal rate, normal rhythm, no murmur, no gallops, no rubs appreciated  Lungs: clear to auscultation bilaterally  Abdomen: soft, non tender  Extremities:  no edema, LUE AVF ligation site healed  Musculoskeletal: no visible deformity, normal range of motion.  Pulses: intact distally throughout  Neurologic: awake, alert, and oriented x3      Laboratory Data and Imaging reviewed in EPIC      Assessment:    ICD-10-CM   1. Kidney transplant recipient  Z94.0   2. Immunosuppressive management encounter following kidney transplant  Z79.899    Z94.0   3. 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 Increase in creatinine was likely due to high tacrolimus level and volume depletion. UPCR stable at 0.297g, UA unremarkable other than expected glucosuria on SGLT2 inh.    Immunosuppression [High Risk Medical Decision Making For Drug Therapy Requiring Intensive Monitoring For Toxicity]: tacrolimus trough level 5.0 ng/mL today, within goal of approximately 5-7 ng/mL. Continue current dose of mycophenolate.     Other interventions:  -LUE AVF s/p ligation: arrange follow up with transplant surgery aneurysmal changes proximal to AVF ligation  -Low level CMV viremia: stable, detected and less than 35 on 02/05/23  -Essential hypertension: BP above goal, start amlodipine 5 mg daily  -Diabetes Mellitus type 2: Hgb A1c 5.9%, continue metformin and empagliflozin          Counseling:  I counseled the patient on:  The need to avoid sun exposure and the use of sunblock while outdoors given the relatively higher risk of skin malignancy in an immunosuppressed state.  The need for adherence to immunosuppression medication.  Patient verbalized understanding.     Follow-Up:  Return in about 3 months (around 05/17/2023).     Patient will continue to follow-up with his primary care provider for non-transplant related issues and medication refills. We have ordered transplant specific labs per the center's guidelines to monitor and assess for toxicities from immunosuppressant drug therapy          Clelia Croft, DO  Transplant Nephrology  [facility] Division of Nephrology and Hypertension  02/14/2023  8:28 AM

## 2023-02-14 NOTE — Unmapped (Signed)
Met w/ patient in [facility] today. Reviewed meds/symptoms. Any new medications?                 Fever/cold/flu symptoms denies  BP: 155/74 < meds today/ Home BP reported 120-130/70 - highest 150/70  BG: 90-115  Headache/Dizziness/Lightheaded: denies    Numbness/tingling: denies  Fevers/chills/sweats: denies  CP/SOB/palpatations: denies  Nausea/vomiting/heartburn: denies  Diarrhea/constipation: denies  UTI symptoms (burn/pain/itch/frequency/urgency/odor/color/foam): denies  No visible or palpable edema     Appetite good; reports adequate hydration. 4 bottles/fluid per day.     Pt reports being well rested and getting adequate exercise.     Continues to follow Covid/health safety precautions by taking care to mask, perform frequent hand hygeine and minimal public activity. Offered support and guidance for this process given their immune-suppressed state. We discussed reduced covid vaccine coverage for transplant patients and importance of continuing to mask and practice safe distancing. Commented that booster vaccines will likely be advised as an ongoing process.          Last envarsus taken 0900 yesterday; held for this morning's labs. Current dose 2 mg daily; Myfortic 360 mg bid     Other complaints or concerns: none    Referrals needed: vascular access clinic follow up     Pt Follow up: vascular access follow up     Immunization status: none today     Functional Score: 100     Employment/work status:  works full time    Start amlodipine 5 mg daily

## 2023-02-16 NOTE — Unmapped (Signed)
Dr. Gwynneth Munson asked for return to access clinic to see Dr. Norma Fredrickson for new aneurysmal area developed following AVF ligation in April

## 2023-02-19 NOTE — Unmapped (Signed)
[facility] Refill Coordination Note    Specialty Medication(s) to be Shipped:   Transplant: Envarsus 4mg     Other medication(s) to be shipped:  Jardiance, amlodipine     Frank Leach, DOB: 03/19/1951  Phone: [phone] (home)       All above HIPAA information was verified with patient.     Was a Nurse, learning disability used for this call? No    Completed refill call assessment today to schedule patient's medication shipment from the [facility] [phone]).  All relevant notes have been reviewed.     Specialty medication(s) and dose(s) confirmed: Regimen is correct and unchanged.   Changes to medications: Frank Leach reports no changes at this time.  Changes to insurance: No  New side effects reported not previously addressed with a pharmacist or physician: None reported  Questions for the pharmacist: No    Confirmed patient received a Conservation officer, historic buildings and a Surveyor, mining with first shipment. The patient will receive a drug information handout for each medication shipped and additional FDA Medication Guides as required.       DISEASE/MEDICATION-SPECIFIC INFORMATION        N/A    SPECIALTY MEDICATION ADHERENCE     Medication Adherence    Patient reported X missed doses in the last month: 0  Specialty Medication: Envarsus 4mg               Were doses missed due to medication being on hold? No    Envarsus 4 mg: 10 days of medicine on hand       REFERRAL TO PHARMACIST     Referral to the pharmacist: Not needed      Frank Leach     Shipping address confirmed in Epic.       Delivery Scheduled: Yes, Expected medication delivery date: 7/23 (amlodipine 7/17).     Medication will be delivered via Next Day Courier to the prescription address in Epic WAM.    Frank Leach, PharmD   [facility] Specialty Pharmacist

## 2023-02-22 DIAGNOSIS — Z940 Kidney transplant status: Principal | ICD-10-CM

## 2023-02-26 MED FILL — JARDIANCE 25 MG TABLET: ORAL | 30 days supply | Qty: 30 | Fill #6

## 2023-02-26 MED FILL — ENVARSUS XR 4 MG TABLET,EXTENDED RELEASE: ORAL | 30 days supply | Qty: 30 | Fill #1

## 2023-03-12 ENCOUNTER — Other Ambulatory Visit: Admit: 2023-03-12 | Discharge: 2023-03-13 | Payer: MEDICARE

## 2023-03-12 LAB — CBC W/ AUTO DIFF
BASOPHILS ABSOLUTE COUNT: 0.1 10*9/L (ref 0.0–0.1)
BASOPHILS RELATIVE PERCENT: 1.1 %
EOSINOPHILS ABSOLUTE COUNT: 0.2 10*9/L (ref 0.0–0.5)
EOSINOPHILS RELATIVE PERCENT: 3.3 %
HEMATOCRIT: 40.3 % (ref 39.0–48.0)
HEMOGLOBIN: 12.6 g/dL — ABNORMAL LOW (ref 12.9–16.5)
LYMPHOCYTES ABSOLUTE COUNT: 1 10*9/L — ABNORMAL LOW (ref 1.1–3.6)
LYMPHOCYTES RELATIVE PERCENT: 21 %
MEAN CORPUSCULAR HEMOGLOBIN CONC: 31.2 g/dL — ABNORMAL LOW (ref 32.0–36.0)
MEAN CORPUSCULAR HEMOGLOBIN: 28.9 pg (ref 25.9–32.4)
MEAN CORPUSCULAR VOLUME: 92.7 fL (ref 77.6–95.7)
MEAN PLATELET VOLUME: 8.4 fL (ref 6.8–10.7)
MONOCYTES ABSOLUTE COUNT: 0.8 10*9/L (ref 0.3–0.8)
MONOCYTES RELATIVE PERCENT: 15.8 %
NEUTROPHILS ABSOLUTE COUNT: 2.9 10*9/L (ref 1.8–7.8)
NEUTROPHILS RELATIVE PERCENT: 58.8 %
NUCLEATED RED BLOOD CELLS: 0 /100{WBCs} (ref <=4)
PLATELET COUNT: 233 10*9/L (ref 150–450)
RED BLOOD CELL COUNT: 4.35 10*12/L (ref 4.26–5.60)
RED CELL DISTRIBUTION WIDTH: 15.5 % — ABNORMAL HIGH (ref 12.2–15.2)
WBC ADJUSTED: 4.9 10*9/L (ref 3.6–11.2)

## 2023-03-12 LAB — RENAL FUNCTION PANEL
ALBUMIN: 4.1 g/dL (ref 3.4–5.0)
ANION GAP: 6 mmol/L (ref 5–14)
BLOOD UREA NITROGEN: 40 mg/dL — ABNORMAL HIGH (ref 9–23)
BUN / CREAT RATIO: 22
CALCIUM: 10.3 mg/dL (ref 8.7–10.4)
CHLORIDE: 114 mmol/L — ABNORMAL HIGH (ref 98–107)
CO2: 22 mmol/L (ref 20.0–31.0)
CREATININE: 1.8 mg/dL — ABNORMAL HIGH
EGFR CKD-EPI (2021) MALE: 40 mL/min/{1.73_m2} — ABNORMAL LOW (ref >=60)
GLUCOSE RANDOM: 92 mg/dL (ref 70–179)
PHOSPHORUS: 2.6 mg/dL (ref 2.4–5.1)
POTASSIUM: 4.8 mmol/L (ref 3.4–4.8)
SODIUM: 142 mmol/L (ref 135–145)

## 2023-03-12 LAB — MAGNESIUM: MAGNESIUM: 1.6 mg/dL (ref 1.6–2.6)

## 2023-03-12 LAB — TACROLIMUS LEVEL, TROUGH: TACROLIMUS, TROUGH: 5.6 ng/mL (ref 5.0–15.0)

## 2023-03-20 DIAGNOSIS — Z79899 Other long term (current) drug therapy: Principal | ICD-10-CM

## 2023-03-20 DIAGNOSIS — Z940 Kidney transplant status: Principal | ICD-10-CM

## 2023-03-20 MED ORDER — MYCOPHENOLATE SODIUM 180 MG TABLET,DELAYED RELEASE
ORAL_TABLET | Freq: Two times a day (BID) | ORAL | 3 refills | 45 days
Start: 2023-03-20 — End: ?

## 2023-03-21 MED ORDER — MYCOPHENOLATE SODIUM 180 MG TABLET,DELAYED RELEASE
ORAL_TABLET | Freq: Two times a day (BID) | ORAL | 3 refills | 45 days | Status: CP
Start: 2023-03-21 — End: ?
  Filled 2023-03-26: qty 180, 45d supply, fill #0

## 2023-03-21 NOTE — Unmapped (Signed)
[facility] Refill Coordination Note    Specialty Medication(s) to be Shipped:   Transplant: Envarsus 4mg  and mycophenolate mofetil 180mg     Other medication(s) to be shipped:  [employer], DOB: 08-14-1950  Phone: [phone] (home)       All above HIPAA information was verified with patient.     Was a Nurse, learning disability used for this call? No    Completed refill call assessment today to schedule patient's medication shipment from the [facility] [phone]).  All relevant notes have been reviewed.     Specialty medication(s) and dose(s) confirmed: Regimen is correct and unchanged.   Changes to medications: Theon reports no changes at this time.  Changes to insurance: No  New side effects reported not previously addressed with a pharmacist or physician: None reported  Questions for the pharmacist: No    Confirmed patient received a Conservation officer, historic buildings and a Surveyor, mining with first shipment. The patient will receive a drug information handout for each medication shipped and additional FDA Medication Guides as required.       DISEASE/MEDICATION-SPECIFIC INFORMATION        N/A    SPECIALTY MEDICATION ADHERENCE     Medication Adherence    Patient reported X missed doses in the last month: 0  Specialty Medication: mycophenolate 180 MG EC tablet (MYFORTIC)  Patient is on additional specialty medications: Yes  Additional Specialty Medications: ENVARSUS XR 4 mg Tb24 extended release tablet (tacrolimus)  Patient Reported Additional Medication X Missed Doses in the Last Month: 0  Patient is on more than two specialty medications: No  Any gaps in refill history greater than 2 weeks in the last 3 months: no  Demonstrates understanding of importance of adherence: yes  Informant: patient  Reliability of informant: reliable  Provider-estimated medication adherence level: good  Patient is at risk for Non-Adherence: No  Reasons for non-adherence: no problems identified  Confirmed plan for next specialty medication refill: delivery by pharmacy  Refills needed for supportive medications: not needed          Refill Coordination    Has the Patients' Contact Information Changed: No  Is the Shipping Address Different: No         Were doses missed due to medication being on hold? No    envarsus 4 mg: 10 days of medicine on hand   Mycophenolate  180 mg: 10 days of medicine on hand       REFERRAL TO PHARMACIST     Referral to the pharmacist: Not needed      [facility]     Shipping address confirmed in Epic.       Delivery Scheduled: Yes, Expected medication delivery date: 08/20.  However, Rx request for refills was sent to the provider as there are none remaining.     Medication will be delivered via Next Day Courier to the prescription address in Epic WAM.    Frank Leach   [facility] Specialty Technician

## 2023-03-22 ENCOUNTER — Ambulatory Visit: Admit: 2023-03-22 | Discharge: 2023-03-23 | Payer: MEDICARE | Attending: Surgery | Primary: Surgery

## 2023-03-22 NOTE — Unmapped (Signed)
VASCULAR ACCESS CLINIC    Assessment/Plan  Frank Leach is a 72 y.o. male who presents for a likely a thrombosed vein in his left upper arm which causes no pain, restriction in movement, and is overall improving.     We will plan to have the patient continue to watch the area. He can follow up if needed or if he wishes to have it removed in the future.       HPI / Subjective  Frank Leach is a 72 y.o. male who presents for vascular access post op check. He previously underwent a DDKT on 06/22/2019 with on going good graft fucnction. He had his LUE AVF ligated with Dr. 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 Since his ligation the area has decreased in size. He has no restriction in movement, no decreased strength, redness or ongoing swelling around the area. He states he would like to just leave it alone since it is improving.      Allergies  Patient has no known allergies.    Medications    Current Outpatient Medications   Medication Sig Dispense Refill    acetaminophen (TYLENOL) 500 MG tablet Take 1-2 tablets (500-1,000 mg total) by mouth every six (6) hours as needed for pain or fever (> 38C). 100 tablet 0    allopurinol (ZYLOPRIM) 100 MG tablet Take 1 tablet (100 mg total) by mouth every evening. 90 tablet 3    amlodipine (NORVASC) 5 MG tablet Take 1 tablet (5 mg total) by mouth daily. 90 tablet 3    atorvastatin (LIPITOR) 40 MG tablet Take 1 tablet (40 mg total) by mouth daily. 90 tablet 3    empagliflozin (JARDIANCE) 25 mg tablet Take 1 tablet (25 mg total) by mouth daily. 30 tablet 11    ENVARSUS XR 4 mg Tb24 extended release tablet Take 1 tablet (4 mg total) by mouth daily. Take 1 tablet ( 4 mg total) in addition to 1 mg tablets for  Total daily dose of 6 mg 30 tablet 11    magnesium oxide-Mg AA chelate (MAGNESIUM, AMINO ACID CHELATE,) 133 mg Take 1 tablet by mouth two (2) times a day. 100 tablet 11    metFORMIN (GLUCOPHAGE-XR) 500 MG 24 hr tablet Take 1 tablet (500 mg total) by mouth in the morning. 180 tablet 3    metFORMIN (GLUCOPHAGE-XR) 500 MG 24 hr tablet Take 2 tablets (1,000 mg total) by mouth daily. 180 tablet 3    mycophenolate (MYFORTIC) 180 MG EC tablet Take 2 tablets (360 mg total) by mouth Two (2) times a day. 180 tablet 3    olmesartan (BENICAR) 40 MG tablet Take 1 tablet (40 mg total) by mouth daily. 90 tablet 3    oxyCODONE (ROXICODONE) 5 MG immediate release tablet Take 1 tablet (5 mg total) by mouth every four (4) hours as needed for pain. 10 tablet 0     No current facility-administered medications for this visit.       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)     ESRD on dialysis (CMS-HCC)     Gout     Hyperlipidemia     Hypertension  Past Surgical History  Past Surgical History:   Procedure Laterality Date    AV FISTULA PLACEMENT      PR COLONOSCOPY FLX DX W/COLLJ SPEC WHEN PFRMD N/A 04/30/2018    Procedure: COLONOSCOPY, FLEXIBLE, PROXIMAL TO SPLENIC FLEXURE; DIAGNOSTIC, W/WO COLLECTION SPECIMEN BY BRUSH OR WASH;  Surgeon: Luanne Bras, MD;  Location: [facility];  Service: Gastroenterology    PR COLONOSCOPY W/BIOPSY SINGLE/MULTIPLE N/A 04/30/2018    Procedure: COLONOSCOPY, FLEXIBLE, PROXIMAL TO SPLENIC FLEXURE; WITH BIOPSY, SINGLE OR MULTIPLE;  Surgeon: Luanne Bras, MD;  Location: [facility];  Service: Gastroenterology    PR COLSC FLX W/RMVL OF TUMOR POLYP LESION SNARE TQ N/A 04/30/2018    Procedure: COLONOSCOPY FLEX; W/REMOV TUMOR/LES BY SNARE;  Surgeon: Luanne Bras, MD;  Location: [facility];  Service: Gastroenterology    PR EXPLORATORY OF ABDOMEN Midline 04/14/2016    Procedure: EXPLORATORY LAPAROTOMY, EXPLORATORY CELIOTOMY WITH OR WITHOUT BIOPSY(S); Surgeon: Leona Carry, MD;  Location: MAIN OR [facility];  Service: Transplant    PR EXPLORATORY OF ABDOMEN N/A 04/16/2016    Procedure: EXPLORATORY LAPAROTOMY, EXPLORATORY CELIOTOMY WITH OR WITHOUT BIOPSY(S);  Surgeon: Leona Carry, MD;  Location: MAIN OR [facility];  Service: Transplant    PR LAP, RADICAL NEPHRECTOMY Right 12/27/2016    Procedure: Robotic Xi Laparoscopy; Radical Nephrectomy (Incl Remove Gerota`S Fascia, Fatty Tissue, Reg Lymph Node, Adrenalectomy);  Surgeon: Tomie China, MD;  Location: MAIN OR [facility];  Service: Urology    PR LIGATN ANGIOACCESS AV FISTULA Left 11/29/2022    Procedure: LIGATION OR BANDING OF ANGIOACCESS ARTERIOVENOUS FISTULA;  Surgeon: Toledo, Lilyan Punt, MD;  Location: MAIN OR [place];  Service: Transplant    PR NEGATIVE PRESSURE WOUND THERAPY DME >50 SQ CM N/A 04/12/2016    Procedure: NEG PRESS WOUND TX (VAC ASSIST) INCL TOPICALS, PER SESSION, TSA GREATER THAN/= 50 CM SQUARED;  Surgeon: Leona Carry, MD;  Location: MAIN OR [place];  Service: Transplant    PR NEGATIVE PRESSURE WOUND THERAPY DME >50 SQ CM Bilateral 04/14/2016    Procedure: NEG PRESS WOUND TX (VAC ASSIST) INCL TOPICALS, PER SESSION, TSA GREATER THAN/= 50 CM SQUARED;  Surgeon: Leona Carry, MD;  Location: MAIN OR ;  Service: Transplant    PR NEGATIVE PRESSURE WOUND THERAPY DME >50 SQ CM N/A 04/16/2016    Procedure: NEG PRESS WOUND TX (VAC ASSIST) INCL TOPICALS, PER SESSION, TSA GREATER THAN/= 50 CM SQUARED;  Surgeon: Leona Carry, MD;  Location: MAIN OR [facility];  Service: Transplant    PR NEPHRECTOMY, W/PART. URETECTOMY Bilateral 04/11/2016    Procedure: LAPAROSCOPY, SURGICAL, NEPHRECTOMY WITH TOTAL URETERECTOMY;  Surgeon: Leona Carry, MD;  Location: MAIN OR [facility];  Service: Transplant    PR REMV KIDNEY,W/RIB RESECTION Bilateral 04/11/2016    Procedure: NEPHRECTOMY, INCLUDING PARTIAL URETERECTOMY, ANY OPEN APPROACH INCLUDING RIB RESECTION;  Surgeon: Leona Carry, MD;  Location: MAIN OR [facility];  Service: Transplant    PR TRANSPLANT,PREP LIVING  RENAL GRAFT N/A 06/22/2019    Procedure: BACKBENCH STD PREP LIVING DONOR RENAL ALLGRFT (OPEN/LAPROSC) PRIOR TO TRANSPLANT, INC DISSECT/REM AS NECESS;  Surgeon: Leona Carry, MD;  Location: MAIN OR [facility];  Service: Transplant    PR TRANSPLANTATION OF KIDNEY N/A 06/22/2019    Procedure: RENAL ALLOTRANSPLANTATION, IMPLANTATION OF GRAFT; WITHOUT RECIPIENT NEPHRECTOMY;  Surgeon: Leona Carry, MD;  Location: MAIN OR [facility];  Service: Transplant    SPLENECTOMY         Family  History  The patient's family history includes Cancer in his father; Kidney disease in his brother and mother..    Social History  Tobacco use: denies    Review of Systems  A 12 system review of systems was negative except as noted in HPI    Objective  Vitals: Blood pressure 143/68, pulse 61, temperature 36.6 ??C (97.9 ??F), temperature source Tympanic, height 170.2 cm (5' 7.01), weight 68.3 kg (150 lb 8 oz). Body mass index is 23.57 kg/m??.  General: pleasant, cooperative, no acute distress, sitting in chair comfortably  Lungs: unlabored breathing, stable on room air   Heart: reg rate,  HDS, hypertensive  Neuro: A&Ox3  Ext: no edema, well perfused small aneurysmal area overlying the left bicep.           Test Results  Lab Results   Component Value Date    WBC 4.9 03/12/2023    HGB 12.6 (L) 03/12/2023    HCT 40.3 03/12/2023    PLT 233 03/12/2023     Lab Results   Component Value Date    NA 142 03/12/2023    K 4.8 03/12/2023    CL 114 (H) 03/12/2023    CO2 22.0 03/12/2023    BUN 40 (H) 03/12/2023    CREATININE 1.80 (H) 03/12/2023    CALCIUM 10.3 03/12/2023    MG 1.6 03/12/2023    PHOS 2.6 03/12/2023     Lab Results   Component Value Date    INR 1.06 01/19/2022    APTT 27.9 01/19/2022     B POS

## 2023-03-26 MED FILL — JARDIANCE 25 MG TABLET: ORAL | 30 days supply | Qty: 30 | Fill #7

## 2023-03-26 MED FILL — ENVARSUS XR 4 MG TABLET,EXTENDED RELEASE: ORAL | 30 days supply | Qty: 30 | Fill #2

## 2023-03-26 MED FILL — OLMESARTAN 40 MG TABLET: ORAL | 90 days supply | Qty: 90 | Fill #2

## 2023-03-26 MED FILL — MG-PLUS-PROTEIN 133 MG TABLET: ORAL | 50 days supply | Qty: 100 | Fill #6

## 2023-03-26 MED FILL — ALLOPURINOL 100 MG TABLET: ORAL | 90 days supply | Qty: 90 | Fill #3

## 2023-03-26 MED FILL — METFORMIN ER 500 MG TABLET,EXTENDED RELEASE 24 HR: ORAL | 90 days supply | Qty: 180 | Fill #1

## 2023-04-10 ENCOUNTER — Ambulatory Visit: Admit: 2023-04-10 | Discharge: 2023-04-11 | Payer: MEDICARE

## 2023-04-10 LAB — CBC W/ AUTO DIFF
BASOPHILS ABSOLUTE COUNT: 0.1 10*9/L (ref 0.0–0.1)
BASOPHILS RELATIVE PERCENT: 1 %
EOSINOPHILS ABSOLUTE COUNT: 0.2 10*9/L (ref 0.0–0.5)
EOSINOPHILS RELATIVE PERCENT: 4.5 %
HEMATOCRIT: 39.5 % (ref 39.0–48.0)
HEMOGLOBIN: 12.5 g/dL — ABNORMAL LOW (ref 12.9–16.5)
LYMPHOCYTES ABSOLUTE COUNT: 1.1 10*9/L (ref 1.1–3.6)
LYMPHOCYTES RELATIVE PERCENT: 19.2 %
MEAN CORPUSCULAR HEMOGLOBIN CONC: 31.6 g/dL — ABNORMAL LOW (ref 32.0–36.0)
MEAN CORPUSCULAR HEMOGLOBIN: 28.7 pg (ref 25.9–32.4)
MEAN CORPUSCULAR VOLUME: 90.9 fL (ref 77.6–95.7)
MEAN PLATELET VOLUME: 7.5 fL (ref 6.8–10.7)
MONOCYTES ABSOLUTE COUNT: 0.8 10*9/L (ref 0.3–0.8)
MONOCYTES RELATIVE PERCENT: 13.7 %
NEUTROPHILS ABSOLUTE COUNT: 3.4 10*9/L (ref 1.8–7.8)
NEUTROPHILS RELATIVE PERCENT: 61.6 %
NUCLEATED RED BLOOD CELLS: 0 /100{WBCs} (ref <=4)
PLATELET COUNT: 251 10*9/L (ref 150–450)
RED BLOOD CELL COUNT: 4.34 10*12/L (ref 4.26–5.60)
RED CELL DISTRIBUTION WIDTH: 16.1 % — ABNORMAL HIGH (ref 12.2–15.2)
WBC ADJUSTED: 5.5 10*9/L (ref 3.6–11.2)

## 2023-04-10 LAB — RENAL FUNCTION PANEL
ALBUMIN: 4 g/dL (ref 3.4–5.0)
ANION GAP: 5 mmol/L (ref 5–14)
BLOOD UREA NITROGEN: 39 mg/dL — ABNORMAL HIGH (ref 9–23)
BUN / CREAT RATIO: 21
CALCIUM: 10.3 mg/dL (ref 8.7–10.4)
CHLORIDE: 116 mmol/L — ABNORMAL HIGH (ref 98–107)
CO2: 21.1 mmol/L (ref 20.0–31.0)
CREATININE: 1.87 mg/dL — ABNORMAL HIGH
EGFR CKD-EPI (2021) MALE: 38 mL/min/{1.73_m2} — ABNORMAL LOW (ref >=60)
GLUCOSE RANDOM: 95 mg/dL (ref 70–179)
PHOSPHORUS: 2.6 mg/dL (ref 2.4–5.1)
POTASSIUM: 5.2 mmol/L — ABNORMAL HIGH (ref 3.4–4.8)
SODIUM: 142 mmol/L (ref 135–145)

## 2023-04-10 LAB — CMV DNA, QUANTITATIVE, PCR: CMV VIRAL LD: NOT DETECTED

## 2023-04-10 LAB — TACROLIMUS LEVEL, TROUGH: TACROLIMUS, TROUGH: 6.4 ng/mL (ref 5.0–15.0)

## 2023-04-10 LAB — MAGNESIUM: MAGNESIUM: 1.8 mg/dL (ref 1.6–2.6)

## 2023-04-16 NOTE — Unmapped (Signed)
[facility] Refill Coordination Note    Specialty Medication(s) to be Shipped:   Transplant: Envarsus 4mg     Other medication(s) to be shipped:  [employer], DOB: 10/20/1950  Phone: [phone] (home)       All above HIPAA information was verified with patient.     Was a Nurse, learning disability used for this call? No    Completed refill call assessment today to schedule patient's medication shipment from the [facility] [phone]).  All relevant notes have been reviewed.     Specialty medication(s) and dose(s) confirmed: Regimen is correct and unchanged.   Changes to medications: [name] reports no changes at this time.  Changes to insurance: No  New side effects reported not previously addressed with a pharmacist or physician: None reported  Questions for the pharmacist: No    Confirmed patient received a Conservation officer, historic buildings and a Surveyor, mining with first shipment. The patient will receive a drug information handout for each medication shipped and additional FDA Medication Guides as required.       DISEASE/MEDICATION-SPECIFIC INFORMATION        N/A    SPECIALTY MEDICATION ADHERENCE     Medication Adherence    Patient reported X missed doses in the last month: 0  Specialty Medication: ENVARSUS XR 4 mg Tb24 extended release tablet (tacrolimus)  Patient is on additional specialty medications: No              Were doses missed due to medication being on hold? No      ENVARSUS XR 4 mg Tb24 extended release tablet (tacrolimus): 7 days of medicine on hand       REFERRAL TO PHARMACIST     Referral to the pharmacist: Not needed      [facility]     Shipping address confirmed in Epic.       Delivery Scheduled: Yes, Expected medication delivery date: 04/19/2023.     Medication will be delivered via Same Day Courier to the prescription address in Epic [place].    Frank Leach J Frank Leach   [facility] Specialty Technician

## 2023-04-20 MED FILL — JARDIANCE 25 MG TABLET: ORAL | 30 days supply | Qty: 30 | Fill #8

## 2023-04-20 MED FILL — ENVARSUS XR 4 MG TABLET,EXTENDED RELEASE: ORAL | 30 days supply | Qty: 30 | Fill #3

## 2023-04-25 ENCOUNTER — Ambulatory Visit: Admit: 2023-04-25 | Discharge: 2023-04-25 | Payer: MEDICARE

## 2023-04-26 DIAGNOSIS — Z940 Kidney transplant status: Principal | ICD-10-CM

## 2023-04-27 NOTE — Unmapped (Signed)
Frank Leach has been contacted in regards to their refill of mycophenolate 180 MG EC tablet (MYFORTIC). At this time, they have declined refill due to patient having POSSIBLY 21 doses remaining. Refill assessment call date has been updated per the patient's request.

## 2023-05-01 MED FILL — ATORVASTATIN 40 MG TABLET: ORAL | 90 days supply | Qty: 90 | Fill #3

## 2023-05-01 MED FILL — MG-PLUS-PROTEIN 133 MG TABLET: ORAL | 50 days supply | Qty: 100 | Fill #7

## 2023-05-01 MED FILL — AMLODIPINE 5 MG TABLET: ORAL | 90 days supply | Qty: 90 | Fill #1

## 2023-05-08 ENCOUNTER — Ambulatory Visit: Admit: 2023-05-08 | Discharge: 2023-05-09 | Payer: MEDICARE

## 2023-05-08 LAB — RENAL FUNCTION PANEL
ALBUMIN: 4 g/dL (ref 3.4–5.0)
ANION GAP: 7 mmol/L (ref 5–14)
BLOOD UREA NITROGEN: 30 mg/dL — ABNORMAL HIGH (ref 9–23)
BUN / CREAT RATIO: 18
CALCIUM: 10.1 mg/dL (ref 8.7–10.4)
CHLORIDE: 112 mmol/L — ABNORMAL HIGH (ref 98–107)
CO2: 22.1 mmol/L (ref 20.0–31.0)
CREATININE: 1.7 mg/dL — ABNORMAL HIGH
EGFR CKD-EPI (2021) MALE: 43 mL/min/{1.73_m2} — ABNORMAL LOW (ref >=60)
GLUCOSE RANDOM: 93 mg/dL (ref 70–179)
PHOSPHORUS: 2.9 mg/dL (ref 2.4–5.1)
POTASSIUM: 4.8 mmol/L (ref 3.4–4.8)
SODIUM: 141 mmol/L (ref 135–145)

## 2023-05-08 LAB — CBC W/ AUTO DIFF
BASOPHILS ABSOLUTE COUNT: 0.1 10*9/L (ref 0.0–0.1)
BASOPHILS RELATIVE PERCENT: 1.1 %
EOSINOPHILS ABSOLUTE COUNT: 0.2 10*9/L (ref 0.0–0.5)
EOSINOPHILS RELATIVE PERCENT: 4.6 %
HEMATOCRIT: 37.5 % — ABNORMAL LOW (ref 39.0–48.0)
HEMOGLOBIN: 12.1 g/dL — ABNORMAL LOW (ref 12.9–16.5)
LYMPHOCYTES ABSOLUTE COUNT: 1 10*9/L — ABNORMAL LOW (ref 1.1–3.6)
LYMPHOCYTES RELATIVE PERCENT: 21.6 %
MEAN CORPUSCULAR HEMOGLOBIN CONC: 32.3 g/dL (ref 32.0–36.0)
MEAN CORPUSCULAR HEMOGLOBIN: 29.4 pg (ref 25.9–32.4)
MEAN CORPUSCULAR VOLUME: 91 fL (ref 77.6–95.7)
MEAN PLATELET VOLUME: 7.6 fL (ref 6.8–10.7)
MONOCYTES ABSOLUTE COUNT: 0.7 10*9/L (ref 0.3–0.8)
MONOCYTES RELATIVE PERCENT: 16.2 %
NEUTROPHILS ABSOLUTE COUNT: 2.6 10*9/L (ref 1.8–7.8)
NEUTROPHILS RELATIVE PERCENT: 56.5 %
NUCLEATED RED BLOOD CELLS: 0 /100{WBCs} (ref <=4)
PLATELET COUNT: 211 10*9/L (ref 150–450)
RED BLOOD CELL COUNT: 4.12 10*12/L — ABNORMAL LOW (ref 4.26–5.60)
RED CELL DISTRIBUTION WIDTH: 15.3 % — ABNORMAL HIGH (ref 12.2–15.2)
WBC ADJUSTED: 4.6 10*9/L (ref 3.6–11.2)

## 2023-05-08 LAB — CMV DNA, QUANTITATIVE, PCR: CMV VIRAL LD: NOT DETECTED

## 2023-05-08 LAB — MAGNESIUM: MAGNESIUM: 1.7 mg/dL (ref 1.6–2.6)

## 2023-05-08 LAB — TACROLIMUS LEVEL, TROUGH: TACROLIMUS, TROUGH: 9.6 ng/mL (ref 5.0–15.0)

## 2023-05-10 DIAGNOSIS — Z79899 Other long term (current) drug therapy: Principal | ICD-10-CM

## 2023-05-10 DIAGNOSIS — Z940 Kidney transplant status: Principal | ICD-10-CM

## 2023-05-10 MED ORDER — ENVARSUS XR 4 MG TABLET,EXTENDED RELEASE
ORAL_TABLET | Freq: Every day | ORAL | 11 refills | 30 days | Status: CP
Start: 2023-05-10 — End: ?

## 2023-05-10 NOTE — Unmapped (Signed)
Reviewed recent labs with Dr. Gwynneth Munson, pt advised to reduce envarsus dose to 4 mg ( currently taking 5 mg daily)  Pt will repeat labs next week.

## 2023-05-14 NOTE — Unmapped (Signed)
[facility] Refill Coordination Note    Specialty Medication(s) to be Shipped:   Transplant: mycophenolate (MYFORTIC) 180 MG EC tablet    Other medication(s) to be shipped:  [employer], DOB: [birth year]  Phone: [phone] (home)       All above HIPAA information was verified with patient.     Was a Nurse, learning disability used for this call? No    Completed refill call assessment today to schedule patient's medication shipment from the [facility] [phone]).  All relevant notes have been reviewed.     Specialty medication(s) and dose(s) confirmed: Regimen is correct and unchanged.   Changes to medications: Caven reports no changes at this time.  Changes to insurance: No  New side effects reported not previously addressed with a pharmacist or physician: None reported  Questions for the pharmacist: No    Confirmed patient received a Conservation officer, historic buildings and a Surveyor, mining with first shipment. The patient will receive a drug information handout for each medication shipped and additional FDA Medication Guides as required.       DISEASE/MEDICATION-SPECIFIC INFORMATION        N/A    SPECIALTY MEDICATION ADHERENCE     Medication Adherence    Patient reported X missed doses in the last month: 0  Specialty Medication: mycophenolate 180 MG EC tablet (MYFORTIC)  Patient is on additional specialty medications: Yes  Additional Specialty Medications: ENVARSUS XR 4 mg Tb24 extended release tablet (tacrolimus)-Patient declined refill at this time   Patient Reported Additional Medication X Missed Doses in the Last Month: 0  Patient is on more than two specialty medications: No  Informant: patient              Were doses missed due to medication being on hold? No      mycophenolate (MYFORTIC) 180 MG EC tablet: 3 days of medicine on hand       REFERRAL TO PHARMACIST     Referral to the pharmacist: Not needed      [facility]     Shipping address confirmed in Epic. Delivery Scheduled: Yes, Expected medication delivery date: 05/16/2023.     Medication will be delivered via Same Day Courier to the prescription address in Epic WAM.    Alwyn Pea   [facility] Specialty Technician

## 2023-05-17 ENCOUNTER — Ambulatory Visit: Admit: 2023-05-17 | Discharge: 2023-05-18 | Payer: MEDICARE

## 2023-05-17 LAB — RENAL FUNCTION PANEL
ALBUMIN: 4.1 g/dL (ref 3.4–5.0)
ANION GAP: 7 mmol/L (ref 5–14)
BLOOD UREA NITROGEN: 30 mg/dL — ABNORMAL HIGH (ref 9–23)
BUN / CREAT RATIO: 17
CALCIUM: 10.4 mg/dL (ref 8.7–10.4)
CHLORIDE: 113 mmol/L — ABNORMAL HIGH (ref 98–107)
CO2: 21.5 mmol/L (ref 20.0–31.0)
CREATININE: 1.75 mg/dL — ABNORMAL HIGH
EGFR CKD-EPI (2021) MALE: 41 mL/min/{1.73_m2} — ABNORMAL LOW (ref >=60)
GLUCOSE RANDOM: 93 mg/dL (ref 70–179)
PHOSPHORUS: 2.3 mg/dL — ABNORMAL LOW (ref 2.4–5.1)
POTASSIUM: 5.1 mmol/L — ABNORMAL HIGH (ref 3.4–4.8)
SODIUM: 141 mmol/L (ref 135–145)

## 2023-05-17 LAB — CBC W/ AUTO DIFF
HEMATOCRIT: 38.4 % — ABNORMAL LOW (ref 39.0–48.0)
HEMOGLOBIN: 12.2 g/dL — ABNORMAL LOW (ref 12.9–16.5)
MEAN CORPUSCULAR HEMOGLOBIN CONC: 31.7 g/dL — ABNORMAL LOW (ref 32.0–36.0)
MEAN CORPUSCULAR HEMOGLOBIN: 29 pg (ref 25.9–32.4)
MEAN CORPUSCULAR VOLUME: 91.5 fL (ref 77.6–95.7)
MEAN PLATELET VOLUME: 7.7 fL (ref 6.8–10.7)
PLATELET COUNT: 242 10*9/L (ref 150–450)
RED BLOOD CELL COUNT: 4.2 10*12/L — ABNORMAL LOW (ref 4.26–5.60)
RED CELL DISTRIBUTION WIDTH: 16.1 % — ABNORMAL HIGH (ref 12.2–15.2)
WBC ADJUSTED: 5.2 10*9/L (ref 3.6–11.2)

## 2023-05-17 LAB — MANUAL DIFFERENTIAL
BASOPHILS - ABS (DIFF): 0.1 10*9/L (ref 0.0–0.1)
BASOPHILS - REL (DIFF): 2 %
EOSINOPHILS - ABS (DIFF): 0.6 10*9/L — ABNORMAL HIGH (ref 0.0–0.5)
EOSINOPHILS - REL (DIFF): 11 %
LYMPHOCYTES - ABS (DIFF): 1 10*9/L — ABNORMAL LOW (ref 1.1–3.6)
LYMPHOCYTES - REL (DIFF): 20 %
MONOCYTES - ABS (DIFF): 0.6 10*9/L (ref 0.3–0.8)
MONOCYTES - REL (DIFF): 12 %
NEUTROPHILS - ABS (DIFF): 2.9 10*9/L (ref 1.8–7.8)
NEUTROPHILS - REL (DIFF): 55 %

## 2023-05-17 LAB — TACROLIMUS LEVEL, TROUGH: TACROLIMUS, TROUGH: 4.3 ng/mL — ABNORMAL LOW (ref 5.0–15.0)

## 2023-05-17 LAB — MAGNESIUM: MAGNESIUM: 1.8 mg/dL (ref 1.6–2.6)

## 2023-05-17 LAB — CMV DNA, QUANTITATIVE, PCR: CMV VIRAL LD: NOT DETECTED

## 2023-05-17 MED FILL — JARDIANCE 25 MG TABLET: ORAL | 30 days supply | Qty: 30 | Fill #9

## 2023-05-17 MED FILL — MYCOPHENOLATE SODIUM 180 MG TABLET,DELAYED RELEASE: ORAL | 45 days supply | Qty: 180 | Fill #1

## 2023-05-17 NOTE — Unmapped (Signed)
Frank Leach 's mycophenolate shipment will be delayed as a result of a high copay.     I have reached out to the patient  at [phone]  and communicated the delay. We will wait for a call back from the patient to reschedule the delivery.  We have not confirmed the new delivery date.

## 2023-05-24 DIAGNOSIS — Z79899 Other long term (current) drug therapy: Principal | ICD-10-CM

## 2023-05-24 DIAGNOSIS — E1169 Type 2 diabetes mellitus with other specified complication: Principal | ICD-10-CM

## 2023-05-24 DIAGNOSIS — Z940 Kidney transplant status: Principal | ICD-10-CM

## 2023-06-01 NOTE — Unmapped (Signed)
[facility] Delivery Pharm[facility] Refill Coordination Note    Specialty Medication(s) to be Shipped:   Transplant: Envarsus 4mg     Other medication(s) to be shipped: No additional medications requested for fill at this time     Frank Leach, DOB: 1950-12-04  Phone: [phone] (home)       All above HIPAA information was verified with patient.     Was a Nurse, learning disability used for this call? No    Completed refill call assessment today to schedule patient's medication shipment from the [facility]  [phone]).  All relevant notes have been reviewed.     Specialty medication(s) and dose(s) confirmed: Regimen is correct and unchanged.   Changes to medications: Alic reports no changes at this time.  Changes to insurance: No  New side effects reported not previously addressed with a pharmacist or physician: None reported  Questions for the pharmacist: No    Confirmed patient received a Conservation officer, historic buildings and a Surveyor, mining with first shipment. The patient will receive a drug information handout for each medication shipped and additional FDA Medication Guides as required.       DISEASE/MEDICATION-SPECIFIC INFORMATION        N/A    SPECIALTY MEDICATION ADHERENCE     Medication Adherence    Patient reported X missed doses in the last month: 0  Specialty Medication: ENVARSUS XR 4 mg Tb24 extended release tablet (tacrolimus)  Patient is on additional specialty medications: No              Were doses missed due to medication being on hold? No      ENVARSUS XR 4 mg Tb24 extended release tablet (tacrolimus): 4 days of medicine on hand       REFERRAL TO PHARMACIST     Referral to the pharmacist: Not needed      [facility]     Shipping address confirmed in Epic.       Delivery Scheduled: Yes, Expected medication delivery date: 06/05/2023.     Medication will be delivered via Same Day Courier to the prescription address in Epic WAM.    [facility]  Specialty Technician

## 2023-06-05 MED FILL — ENVARSUS XR 4 MG TABLET,EXTENDED RELEASE: ORAL | 30 days supply | Qty: 30 | Fill #0

## 2023-06-08 ENCOUNTER — Ambulatory Visit: Admit: 2023-06-08 | Discharge: 2023-06-09 | Payer: MEDICARE

## 2023-06-08 ENCOUNTER — Ambulatory Visit: Admit: 2023-06-08 | Discharge: 2023-06-09 | Payer: MEDICARE | Attending: Nephrology | Primary: Nephrology

## 2023-06-08 NOTE — Unmapped (Signed)
Met w/ patient in [facility] today. Reviewed meds/symptoms. Any new medications?                 Fever/cold/flu symptoms denies  BP: 145/62 today/ Home BP reported 120-130/70  BG: 120s  Headache/Dizziness/Lightheaded: denies  Hand tremors: denies  Numbness/tingling: denies  Fevers/chills/sweats: denies  CP/SOB/palpatations: denies  Nausea/vomiting/heartburn: denies  Diarrhea/constipation: denies  UTI symptoms (burn/pain/itch/frequency/urgency/odor/color/foam): denies  No visible or palpable edema     Appetite good; reports adequate hydration. 4-5 oz/bottles/fluid per day.     Pt reports being well rested and getting adequate exercise.     Continues to follow Covid/health safety precautions by taking care to mask, perform frequent hand hygeine and minimal public activity. Offered support and guidance for this process given their immune-suppressed state. We discussed reduced covid vaccine coverage for transplant patients and importance of continuing to mask and practice safe distancing. Commented that booster vaccines will likely be advised as an ongoing process.        Last envarsus taken 0900; held for this morning's labs. Current dose 4 mg daily; Myfortic 360 mg bid     Other complaints or concerns: wife reports weight loss, reports walking daily    Referrals needed: n/a     Pt Follow up: labs after visit today     Immunization status: flu/COVID today, will give Prevnar next visit     Functional Score: 100     Employment/work status:  retired

## 2023-06-08 NOTE — Unmapped (Signed)
Transplant Nephrology Clinic Visit    History of Present Illness    72 y.o. male here for follow up after kidney transplantation.      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 Interval decrease in linear soft tissue within the right nephrectomy bed, potentially postsurgical or fat necrosis. Unchanged linear tissue within the left nephrectomy bed. No suspicious enhancing lesions are visualized within the surgical beds     Post-Txp 06/25/2019 (txp kidney only)  The renal transplant was located in the left lower quadrant. Normal size and echogenicity.  No solid masses or calculi. Trace perinephric fluid adjacent to the lower pole of the kidney. Mild pelviectasis  - Perfusion: Using power Doppler, normal perfusion was seen throughout the renal parenchyma.  - Resistive indices in the renal transplant are stable compared with prior examination.  - Main renal artery/iliac artery: Patent. Again noted are 3 renal arteries. Resistive indices within the renal arteries are stable to minimally increased, now at or just above normal limits.  - Main renal vein/iliac vein: Patent       Subjective/Interval:       Wife is overall concerned about general weight loss--only about 5lbs down since Feb 2023    BP 145/62 here, at home 110-130s/60-70s  Glucose 120s at home  Tolerating jardiance and metformin            Review of Systems      Otherwise as per HPI, all other systems reviewed and are negative.    Past Medical History, Surgical History, Family History, and Social History reviewed in electronic record        Medications  Current Outpatient Medications   Medication Sig Dispense Refill    acetaminophen (TYLENOL) 500 MG tablet Take 1-2 tablets (500-1,000 mg total) by mouth every six (6) hours as needed for pain or fever (> 38C). 100 tablet 0    allopurinol (ZYLOPRIM) 100 MG tablet Take 1 tablet (100 mg total) by mouth every evening. 90 tablet 3    amlodipine (NORVASC) 5 MG tablet Take 1 tablet (5 mg total) by mouth daily. 90 tablet 3    atorvastatin (LIPITOR) 40 MG tablet Take 1 tablet (40 mg total) by mouth daily. 90 tablet 3    empagliflozin (JARDIANCE) 25 mg tablet Take 1 tablet (25 mg total) by mouth daily. 30 tablet 11    ENVARSUS XR 4 mg Tb24 extended release tablet Take 1 tablet (4 mg total) by mouth daily. 30 tablet 11    magnesium oxide-Mg AA chelate (MAGNESIUM, AMINO ACID CHELATE,) 133 mg Take 1 tablet by mouth two (2) times a day. 100 tablet 11    metFORMIN (GLUCOPHAGE-XR) 500 MG 24 hr tablet Take 1 tablet (500 mg total) by mouth in the morning. 180 tablet 3    metFORMIN (GLUCOPHAGE-XR) 500 MG 24 hr tablet Take 2 tablets (1,000 mg total) by mouth daily. 180 tablet 3  mycophenolate (MYFORTIC) 180 MG EC tablet Take 2 tablets (360 mg total) by mouth Two (2) times a day. 180 tablet 3    olmesartan (BENICAR) 40 MG tablet Take 1 tablet (40 mg total) by mouth daily. 90 tablet 3    oxyCODONE (ROXICODONE) 5 MG immediate release tablet Take 1 tablet (5 mg total) by mouth every four (4) hours as needed for pain. 10 tablet 0     No current facility-administered medications for this visit.           Physical Exam  BP 145/62 (BP Site: R Arm, BP Position: Sitting, BP Cuff Size: Medium)  - Pulse 52  - Temp 36.5 ??C (97.7 ??F) (Temporal)  - Wt 68.9 kg (151 lb 12.8 oz)  - BMI 23.77 kg/m??   General: no acute distress  HEENT: mucous membranes moist  Neck: neck supple, no cervical lymphadenopathy appreciated  CV: normal rate, normal rhythm, no murmur, no gallops, no rubs appreciated  Lungs: clear to auscultation bilaterally  Abdomen: soft, non tender  Extremities:  no edema, LUE AVF ligation site healed  Musculoskeletal: no visible deformity, normal range of motion.  Pulses: intact distally throughout  Neurologic: awake, alert, and oriented x3      Laboratory Data and Imaging reviewed in EPIC      Assessment:    ICD-10-CM   1. Kidney transplant recipient  Z94.0   2. Immunosuppressed status (CMS-HCC)  D84.9   3. Immunosuppressive management encounter following kidney transplant  Z79.899    Z94.0   4. Primary hypertension  I10   5. 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 No DSA. UPCR 0.258g, stable. Continue jardiance 25 mg daily.    Immunosuppression [High Risk Medical Decision Making For Drug Therapy Requiring Intensive Monitoring For Toxicity]: tacrolimus trough level 3.6 ng/mL today, below goal of approximately 5-7 ng/mL--increase envarsus dose to 5 mg daily. Continue current dose of mycophenolate 360 mg BID. WBC normal.     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  The need for adherence to immunosuppression medication.  Patient verbalized understanding.     Follow-Up:  Return in about 6 months (around 12/06/2023). Patient will continue to follow-up with his primary care provider for non-transplant related issues and medication refills. We have ordered transplant specific labs per the center's guidelines to monitor and assess for toxicities from immunosuppressant drug therapy          Clelia Croft, DO  Transplant Nephrology  [facility] Division of Nephrology and Hypertension  06/08/2023  9:54 AM

## 2023-06-11 ENCOUNTER — Ambulatory Visit: Admit: 2023-06-11 | Discharge: 2023-06-12 | Payer: MEDICARE

## 2023-06-11 DIAGNOSIS — Z940 Kidney transplant status: Principal | ICD-10-CM

## 2023-06-11 DIAGNOSIS — M109 Gout, unspecified: Principal | ICD-10-CM

## 2023-06-11 LAB — CBC W/ AUTO DIFF
BASOPHILS ABSOLUTE COUNT: 0.1 10*9/L (ref 0.0–0.1)
BASOPHILS RELATIVE PERCENT: 1.4 %
EOSINOPHILS ABSOLUTE COUNT: 0.3 10*9/L (ref 0.0–0.5)
EOSINOPHILS RELATIVE PERCENT: 6.9 %
HEMATOCRIT: 38.5 % — ABNORMAL LOW (ref 39.0–48.0)
HEMOGLOBIN: 12.4 g/dL — ABNORMAL LOW (ref 12.9–16.5)
LYMPHOCYTES ABSOLUTE COUNT: 0.8 10*9/L — ABNORMAL LOW (ref 1.1–3.6)
LYMPHOCYTES RELATIVE PERCENT: 16.7 %
MEAN CORPUSCULAR HEMOGLOBIN CONC: 32.2 g/dL (ref 32.0–36.0)
MEAN CORPUSCULAR HEMOGLOBIN: 29.5 pg (ref 25.9–32.4)
MEAN CORPUSCULAR VOLUME: 91.5 fL (ref 77.6–95.7)
MEAN PLATELET VOLUME: 7.7 fL (ref 6.8–10.7)
MONOCYTES ABSOLUTE COUNT: 1.1 10*9/L — ABNORMAL HIGH (ref 0.3–0.8)
MONOCYTES RELATIVE PERCENT: 23.5 %
NEUTROPHILS ABSOLUTE COUNT: 2.4 10*9/L (ref 1.8–7.8)
NEUTROPHILS RELATIVE PERCENT: 51.5 %
NUCLEATED RED BLOOD CELLS: 0 /100{WBCs} (ref <=4)
PLATELET COUNT: 226 10*9/L (ref 150–450)
RED BLOOD CELL COUNT: 4.21 10*12/L — ABNORMAL LOW (ref 4.26–5.60)
RED CELL DISTRIBUTION WIDTH: 15.9 % — ABNORMAL HIGH (ref 12.2–15.2)
WBC ADJUSTED: 4.6 10*9/L (ref 3.6–11.2)

## 2023-06-11 LAB — RENAL FUNCTION PANEL
ALBUMIN: 3.9 g/dL (ref 3.4–5.0)
ANION GAP: 6 mmol/L (ref 5–14)
BLOOD UREA NITROGEN: 28 mg/dL — ABNORMAL HIGH (ref 9–23)
BUN / CREAT RATIO: 16
CALCIUM: 10.3 mg/dL (ref 8.7–10.4)
CHLORIDE: 113 mmol/L — ABNORMAL HIGH (ref 98–107)
CO2: 23.3 mmol/L (ref 20.0–31.0)
CREATININE: 1.76 mg/dL — ABNORMAL HIGH
EGFR CKD-EPI (2021) MALE: 41 mL/min/{1.73_m2} — ABNORMAL LOW (ref >=60)
GLUCOSE RANDOM: 105 mg/dL (ref 70–179)
PHOSPHORUS: 2.9 mg/dL (ref 2.4–5.1)
POTASSIUM: 4.5 mmol/L (ref 3.4–4.8)
SODIUM: 142 mmol/L (ref 135–145)

## 2023-06-11 LAB — SLIDE REVIEW

## 2023-06-11 LAB — URINALYSIS WITH MICROSCOPY
BACTERIA: NONE SEEN /HPF
BILIRUBIN UA: NEGATIVE
BLOOD UA: NEGATIVE
GLUCOSE UA: 1000 — AB
KETONES UA: NEGATIVE
LEUKOCYTE ESTERASE UA: NEGATIVE
NITRITE UA: NEGATIVE
PH UA: 5.5 (ref 5.0–9.0)
PROTEIN UA: NEGATIVE
RBC UA: <1 /HPF (ref <=3)
SPECIFIC GRAVITY UA: 1.012 (ref 1.003–1.030)
SQUAMOUS EPITHELIAL: <1 /HPF (ref 0–5)
UROBILINOGEN UA: <2
WBC UA: <1 /HPF (ref <=2)

## 2023-06-11 LAB — HEMOGLOBIN A1C
ESTIMATED AVERAGE GLUCOSE: 126 mg/dL
HEMOGLOBIN A1C: 6 % — ABNORMAL HIGH (ref 4.8–5.6)

## 2023-06-11 LAB — PROTEIN / CREATININE RATIO, URINE
CREATININE, URINE: 69.4 mg/dL
PROTEIN URINE: 17.9 mg/dL
PROTEIN/CREAT RATIO, URINE: 0.258

## 2023-06-11 LAB — CMV DNA, QUANTITATIVE, PCR: CMV VIRAL LD: NOT DETECTED

## 2023-06-11 LAB — BK VIRUS QUANTITATIVE PCR, BLOOD: BK BLOOD RESULT: NOT DETECTED

## 2023-06-11 LAB — MAGNESIUM: MAGNESIUM: 1.6 mg/dL (ref 1.6–2.6)

## 2023-06-11 LAB — TACROLIMUS LEVEL, TROUGH: TACROLIMUS, TROUGH: 3.6 ng/mL — ABNORMAL LOW (ref 5.0–15.0)

## 2023-06-11 MED ORDER — MG-PLUS-PROTEIN 133 MG TABLET
ORAL_TABLET | Freq: Two times a day (BID) | ORAL | 11 refills | 50 days | Status: CP
Start: 2023-06-11 — End: ?
  Filled 2023-06-18: qty 100, 50d supply, fill #0

## 2023-06-11 MED ORDER — ALLOPURINOL 100 MG TABLET
ORAL_TABLET | Freq: Every evening | ORAL | 3 refills | 90 days | Status: CP
Start: 2023-06-11 — End: ?
  Filled 2023-06-18: qty 90, 90d supply, fill #0

## 2023-06-11 NOTE — Unmapped (Addendum)
[facility] and Home Delivery Pharmacy Clinical Assessment & Refill Coordination Note    Frank Leach, Frank Leach: Jun 20, 1951  Phone: [phone] (home)     All above HIPAA information was verified with patient.     Was a Nurse, learning disability used for this call? No    Specialty Medication(s):   Transplant: Envarsus 4mg  and  mycophenolic acid 180mg      Current Outpatient Medications   Medication Sig Dispense Refill    acetaminophen (TYLENOL) 500 MG tablet Take 1-2 tablets (500-1,000 mg total) by mouth every six (6) hours as needed for pain or fever (> 38C). 100 tablet 0    allopurinol (ZYLOPRIM) 100 MG tablet Take 1 tablet (100 mg total) by mouth every evening. 90 tablet 3    amlodipine (NORVASC) 5 MG tablet Take 1 tablet (5 mg total) by mouth daily. 90 tablet 3    atorvastatin (LIPITOR) 40 MG tablet Take 1 tablet (40 mg total) by mouth daily. 90 tablet 3    empagliflozin (JARDIANCE) 25 mg tablet Take 1 tablet (25 mg total) by mouth daily. 30 tablet 11    ENVARSUS XR 4 mg Tb24 extended release tablet Take 1 tablet (4 mg total) by mouth daily. 30 tablet 11    magnesium oxide-Mg AA chelate (MAGNESIUM, AMINO ACID CHELATE,) 133 mg Take 1 tablet by mouth two (2) times a day. 100 tablet 11    metFORMIN (GLUCOPHAGE-XR) 500 MG 24 hr tablet Take 1 tablet (500 mg total) by mouth in the morning. 180 tablet 3    metFORMIN (GLUCOPHAGE-XR) 500 MG 24 hr tablet Take 2 tablets (1,000 mg total) by mouth daily. 180 tablet 3    mycophenolate (MYFORTIC) 180 MG EC tablet Take 2 tablets (360 mg total) by mouth Two (2) times a day. 180 tablet 3    olmesartan (BENICAR) 40 MG tablet Take 1 tablet (40 mg total) by mouth daily. 90 tablet 3    oxyCODONE (ROXICODONE) 5 MG immediate release tablet Take 1 tablet (5 mg total) by mouth every four (4) hours as needed for pain. 10 tablet 0     No current facility-administered medications for this visit.        Changes to medications: Frank Leach reports no changes at this time.    No Known Allergies    Changes to allergies: No    SPECIALTY MEDICATION ADHERENCE     Envarsus 4mg   : 28 days of medicine on hand   Mycophenolate 180mg   : 12 days of medicine on hand     Medication Adherence    Patient reported X missed doses in the last month: 0  Specialty Medication: envarsus 4mg   Patient is on additional specialty medications: Yes  Additional Specialty Medications: Mycophenolate 180mg   Patient Reported Additional Medication X Missed Doses in the Last Month: 0  Patient is on more than two specialty medications: No          Specialty medication(s) dose(s) confirmed: Regimen is correct and unchanged.     Are there any concerns with adherence? No    Adherence counseling provided? Not needed    CLINICAL MANAGEMENT AND INTERVENTION      Clinical Benefit Assessment:    Do you feel the medicine is effective or helping your condition? Yes    Clinical Benefit counseling provided? Not needed    Adverse Effects Assessment:    Are you experiencing any side effects? No    Are you experiencing difficulty administering your medicine? No    Quality of Life  Assessment:    Quality of Life    Rheumatology  Oncology  Dermatology  Cystic Fibrosis          How many days over the past month did your transplant  keep you from your normal activities? For example, brushing your teeth or getting up in the morning. 0    Have you discussed this with your provider? Not needed    Acute Infection Status:    Acute infections noted within Epic:  No active infections  Patient reported infection: None    Therapy Appropriateness:    Is therapy appropriate based on current medication list, adverse reactions, adherence, clinical benefit and progress toward achieving therapeutic goals? Yes, therapy is appropriate and should be continued     DISEASE/MEDICATION-SPECIFIC INFORMATION      N/A    Solid Organ Transplant: Not Applicable    PATIENT SPECIFIC NEEDS     Does the patient have any physical, cognitive, or cultural barriers? No    Is the patient high risk? Yes, patient is taking a REMS drug. Medication is dispensed in compliance with REMS program    Did the patient require a clinical intervention? No    Does the patient require physician intervention or other additional services (i.e., nutrition, smoking cessation, social work)? No    SOCIAL DETERMINANTS OF HEALTH     At the [facility], we have learned that life circumstances - like trouble affording food, housing, utilities, or transportation can affect the health of many of our patients.   That is why we wanted to ask: are you currently experiencing any life circumstances that are negatively impacting your health and/or quality of life? Patient declined to answer    Social Determinants of Health     Food Insecurity: No Food Insecurity (06/23/2019)    Hunger Vital Sign     Worried About Running Out of Food in the Last Year: Never true     Ran Out of Food in the Last Year: Never true   Internet Connectivity: Not on file   Housing/Utilities: Unknown (11/13/2020)    Housing/Utilities     Within the past 12 months, have you ever stayed: outside, in a car, in a tent, in an overnight shelter, or temporarily in someone else's home (i.e. couch-surfing)?: No     Are you worried about losing your housing?: Not on file     Within the past 12 months, have you been unable to get utilities (heat, electricity) when it was really needed?: Not on file   Tobacco Use: Low Risk  (06/08/2023)    Patient History     Smoking Tobacco Use: Never     Smokeless Tobacco Use: Never     Passive Exposure: Not on file   Transportation Needs: Not on file   Alcohol Use: Not on file   Interpersonal Safety: Unknown (06/11/2023)    Interpersonal Safety     Unsafe Where You Currently Live: Not on file     Physically Hurt by Anyone: Not on file     Abused by Anyone: Not on file   Physical Activity: Not on file   Intimate Partner Violence: Not on file   Stress: Not on file   Substance Use: Not on file   Social Connections: Not on file   Financial Resource Strain: Not on file   Depression: Not at risk (12/21/2022)    PHQ-2     PHQ-2 Score: 0   Health Literacy: Low Risk  (11/13/2020)  Health Literacy     : Never       Would you be willing to receive help with any of the needs that you have identified today? Not applicable       SHIPPING     Specialty Medication(s) to be Shipped:   Transplant:  mycophenolic acid 180mg     Other medication(s) to be shipped:  allopurinol, jardiance, magnesium, olmesartan  Patient declined ALL other refills at this time - next call set upfor 2 weeks out per his request     Changes to insurance: No    Delivery Scheduled: Yes, Expected medication delivery date: 06/19/2023.  However, Rx request for refills was sent to the provider as there are none remaining.     Medication will be delivered via Next Day Courier to the confirmed prescription address in [facility].    The patient will receive a drug information handout for each medication shipped and additional FDA Medication Guides as required.  Verified that patient has previously received a Conservation officer, historic buildings and a Surveyor, mining.    The patient or caregiver noted above participated in the development of this care plan and knows that they can request review of or adjustments to the care plan at any time.      All of the patient's questions and concerns have been addressed.    Thad Ranger, PharmD   [facility] Pharm[facility] Specialty Pharmacist

## 2023-06-12 DIAGNOSIS — Z79899 Other long term (current) drug therapy: Principal | ICD-10-CM

## 2023-06-12 DIAGNOSIS — Z940 Kidney transplant status: Principal | ICD-10-CM

## 2023-06-12 MED ORDER — TACROLIMUS XR 1 MG TABLET,EXTENDED RELEASE 24 HR
ORAL_TABLET | Freq: Every day | ORAL | 3 refills | 90 days | Status: CP
Start: 2023-06-12 — End: 2024-06-11
  Filled 2023-06-28: qty 30, 30d supply, fill #0

## 2023-06-12 MED ORDER — ENVARSUS XR 4 MG TABLET,EXTENDED RELEASE
ORAL_TABLET | Freq: Every day | ORAL | 11 refills | 30 days | Status: CP
Start: 2023-06-12 — End: ?

## 2023-06-12 NOTE — Unmapped (Addendum)
[facility] Pharmacist has reviewed a new prescription for envarsus  that indicates a dose increase.  Patient was counseled on this dosage change by CC- see epic note from 06/12/23.  Next refill call date adjusted if necessary.          Clinical Assessment Needed For: Dose Change  Medication: Envarsus XR 1mg  tablet  Last Fill Date/Day Supply: 12/20/2022 / 30 days  Copay $101.03  Was previous dose already scheduled to fill: No    Notes to Pharmacist: N/A      Clinical Assessment Needed For: Dose Change  Medication: Envarsus XR 4mg  tablet  Last Fill Date/Day Supply: 06/06/2023 / 30 days  Refill Too Soon until 06/26/2023  Was previous dose already scheduled to fill: No    Notes to Pharmacist: Will re-test on 11/19

## 2023-06-12 NOTE — Unmapped (Signed)
Reviewed recent labs and tac level with Dr. Gwynneth Munson, pt advised to increase envarsus to 5 mg daily.  Pt verbalized understanding.

## 2023-06-15 LAB — FSAB CLASS 1 ANTIBODY SPECIFICITY: HLA CLASS 1 ANTIBODY RESULT: POSITIVE

## 2023-06-15 LAB — HLA DS POST TRANSPLANT
ANTI-DONOR DRW #1 MFI: 21 MFI
ANTI-DONOR DRW #2 MFI: 21 MFI
ANTI-DONOR HLA-A #1 MFI: 30 MFI
ANTI-DONOR HLA-A #2 MFI: 7 MFI
ANTI-DONOR HLA-B #1 MFI: 6 MFI
ANTI-DONOR HLA-B #2 MFI: 0 MFI
ANTI-DONOR HLA-C #1 MFI: 132 MFI
ANTI-DONOR HLA-C #2 MFI: 5 MFI
ANTI-DONOR HLA-DP AG #1 MFI: 39 MFI
ANTI-DONOR HLA-DQB #1 MFI: 20 MFI
ANTI-DONOR HLA-DQB #2 MFI: 514 MFI
ANTI-DONOR HLA-DR #1 MFI: 0 MFI
ANTI-DONOR HLA-DR #2 MFI: 13 MFI

## 2023-06-15 LAB — FSAB CLASS 2 ANTIBODY SPECIFICITY: HLA CL2 AB RESULT: NEGATIVE

## 2023-06-18 MED FILL — JARDIANCE 25 MG TABLET: ORAL | 30 days supply | Qty: 30 | Fill #10

## 2023-06-18 MED FILL — MYCOPHENOLATE SODIUM 180 MG TABLET,DELAYED RELEASE: ORAL | 45 days supply | Qty: 180 | Fill #2

## 2023-06-18 MED FILL — OLMESARTAN 40 MG TABLET: ORAL | 90 days supply | Qty: 90 | Fill #3

## 2023-06-26 NOTE — Unmapped (Signed)
UNOS Form

## 2023-06-27 NOTE — Unmapped (Signed)
[facility] and [facility] Delivery Pharmacy Refill Coordination Note    Specialty Medication(s) to be Shipped:   Transplant: Envarsus 4mg     Other medication(s) to be shipped: No additional medications requested for fill at this time     Frank Leach, DOB: 11/27/1950  Phone: [phone] (home)       All above HIPAA information was verified with patient.     Was a Nurse, learning disability used for this call? No    Completed refill call assessment today to schedule patient's medication shipment from the [facility]  [phone]).  All relevant notes have been reviewed.     Specialty medication(s) and dose(s) confirmed: Patient reports changes to the regimen as follows: Total daily dose has increase to 5 mg daily    Changes to medications: Garth reports no changes at this time.  Changes to insurance: No  New side effects reported not previously addressed with a pharmacist or physician: None reported  Questions for the pharmacist: No    Confirmed patient received a Conservation officer, historic buildings and a Surveyor, mining with first shipment. The patient will receive a drug information handout for each medication shipped and additional FDA Medication Guides as required.       DISEASE/MEDICATION-SPECIFIC INFORMATION        N/A    SPECIALTY MEDICATION ADHERENCE     Medication Adherence    Patient reported X missed doses in the last month: 0  Specialty Medication: ENVARSUS XR 4 mg Tb24 extended release tablet (tacrolimus)  Patient is on additional specialty medications: No  Informant: patient              Were doses missed due to medication being on hold? No    Envarsus 4 mg: 4 days of medicine on hand       REFERRAL TO PHARMACIST     Referral to the pharmacist: Not needed      [facility]     Shipping address confirmed in Epic.       Delivery Scheduled: Yes, Expected medication delivery date: 06/28/23.     Medication will be delivered via Same Day Courier to the prescription address in Epic WAM.    Jasper Loser   [facility] Special[facility] and Home Delivery Pharmacy  Specialty Technician

## 2023-06-28 NOTE — Unmapped (Signed)
Therapy Update Follow Up: No issues - Copay = $144.51 (Envarsus XR 4mg )

## 2023-07-10 ENCOUNTER — Ambulatory Visit: Admit: 2023-07-10 | Discharge: 2023-07-11 | Payer: MEDICARE

## 2023-07-10 LAB — CBC W/ AUTO DIFF
BASOPHILS ABSOLUTE COUNT: 0.1 10*9/L (ref 0.0–0.1)
BASOPHILS RELATIVE PERCENT: 1 %
EOSINOPHILS ABSOLUTE COUNT: 0.2 10*9/L (ref 0.0–0.5)
EOSINOPHILS RELATIVE PERCENT: 4.7 %
HEMATOCRIT: 38.2 % — ABNORMAL LOW (ref 39.0–48.0)
HEMOGLOBIN: 12.3 g/dL — ABNORMAL LOW (ref 12.9–16.5)
LYMPHOCYTES ABSOLUTE COUNT: 1.1 10*9/L (ref 1.1–3.6)
LYMPHOCYTES RELATIVE PERCENT: 22.5 %
MEAN CORPUSCULAR HEMOGLOBIN CONC: 32.2 g/dL (ref 32.0–36.0)
MEAN CORPUSCULAR HEMOGLOBIN: 29.4 pg (ref 25.9–32.4)
MEAN CORPUSCULAR VOLUME: 91.3 fL (ref 77.6–95.7)
MEAN PLATELET VOLUME: 7.5 fL (ref 6.8–10.7)
MONOCYTES ABSOLUTE COUNT: 0.8 10*9/L (ref 0.3–0.8)
MONOCYTES RELATIVE PERCENT: 16.1 %
NEUTROPHILS ABSOLUTE COUNT: 2.8 10*9/L (ref 1.8–7.8)
NEUTROPHILS RELATIVE PERCENT: 55.7 %
NUCLEATED RED BLOOD CELLS: 0 /100{WBCs} (ref <=4)
PLATELET COUNT: 222 10*9/L (ref 150–450)
RED BLOOD CELL COUNT: 4.18 10*12/L — ABNORMAL LOW (ref 4.26–5.60)
RED CELL DISTRIBUTION WIDTH: 16 % — ABNORMAL HIGH (ref 12.2–15.2)
WBC ADJUSTED: 5 10*9/L (ref 3.6–11.2)

## 2023-07-10 LAB — RENAL FUNCTION PANEL
ALBUMIN: 4 g/dL (ref 3.4–5.0)
ANION GAP: 11 mmol/L (ref 5–14)
BLOOD UREA NITROGEN: 33 mg/dL — ABNORMAL HIGH (ref 9–23)
BUN / CREAT RATIO: 19
CALCIUM: 10.3 mg/dL (ref 8.7–10.4)
CHLORIDE: 109 mmol/L — ABNORMAL HIGH (ref 98–107)
CO2: 22.4 mmol/L (ref 20.0–31.0)
CREATININE: 1.77 mg/dL — ABNORMAL HIGH (ref 0.73–1.18)
EGFR CKD-EPI (2021) MALE: 41 mL/min/{1.73_m2} — ABNORMAL LOW (ref >=60)
GLUCOSE RANDOM: 97 mg/dL (ref 70–179)
PHOSPHORUS: 2.7 mg/dL (ref 2.4–5.1)
POTASSIUM: 4.8 mmol/L (ref 3.4–4.8)
SODIUM: 142 mmol/L (ref 135–145)

## 2023-07-10 LAB — TACROLIMUS LEVEL, TROUGH: TACROLIMUS, TROUGH: 6.4 ng/mL (ref 5.0–15.0)

## 2023-07-10 LAB — MAGNESIUM: MAGNESIUM: 1.6 mg/dL (ref 1.6–2.6)

## 2023-07-10 LAB — CMV DNA, QUANTITATIVE, PCR
CMV QUANT: <35 [IU]/mL — ABNORMAL HIGH (ref <0)
CMV VIRAL LD: DETECTED — AB

## 2023-07-31 NOTE — Unmapped (Signed)
Patient called to report increased OO cost for his jardiance of $135 for 30d supply.  Pt has enough medication until end of year, will try to run insurance after new year to see I he can get a better cost or will try a different medication.  Pt agreed with the plan.

## 2023-08-06 MED ORDER — ATORVASTATIN 40 MG TABLET
ORAL_TABLET | Freq: Every day | ORAL | 3 refills | 90 days | Status: CP
Start: 2023-08-06 — End: 2024-08-05
  Filled 2023-08-09: qty 90, 90d supply, fill #0

## 2023-08-06 NOTE — Unmapped (Signed)
[facility] Specialty and Home Delivery Pharmacy Refill Coordination Note    Specialty Medication(s) to be Shipped:   Transplant: Envarsus XR4mg     Other medication(s) to be shipped: atorvastatin 40 MG tablet (LIPITOR), amlodipine 5 MG tablet (NORVASC)     Frank Leach, DOB: [birth year]  Phone: [phone] (home)       All above HIPAA information was verified with patient.     Was a Nurse, learning disability used for this call? No    Completed refill call assessment today to schedule patient's medication shipment from the [facility] and Home Delivery Pharmacy  [phone]).  All relevant notes have been reviewed.     Specialty medication(s) and dose(s) confirmed: Regimen is correct and unchanged.   Changes to medications: Frank Leach reports no changes at this time.  Changes to insurance: No  New side effects reported not previously addressed with a pharmacist or physician: None reported  Questions for the pharmacist: No    Confirmed patient received a Conservation officer, historic buildings and a Surveyor, mining with first shipment. The patient will receive a drug information handout for each medication shipped and additional FDA Medication Guides as required.       DISEASE/MEDICATION-SPECIFIC INFORMATION        N/A    SPECIALTY MEDICATION ADHERENCE     Medication Adherence    Patient reported X missed doses in the last month: 0  Specialty Medication: ENVARSUS XR 4 mg Tb24 extended release tablet (tacrolimus)  Patient is on additional specialty medications: No  Additional Specialty Medications: ENVARSUS XR 4 mg Tb24 extended release tablet (tacrolimus)  Patient is on more than two specialty medications: No  Any gaps in refill history greater than 2 weeks in the last 3 months: no  Demonstrates understanding of importance of adherence: yes              Were doses missed due to medication being on hold? No    ENVARSUS XR 4   mg: 4 days of medicine on hand       REFERRAL TO PHARMACIST     Referral to the pharmacist: Not needed      [facility] Shipping address confirmed in Epic.       Delivery Scheduled: Yes, Expected medication delivery date: 08/09/23.     Medication will be delivered via Same Day Courier to the prescription address in Epic WAM.    Frank Leach   [facility] Specialty and Home Delivery Pharmacy  Specialty Technician

## 2023-08-06 NOTE — Unmapped (Signed)
 Pt request for RX Refill

## 2023-08-09 MED FILL — AMLODIPINE 5 MG TABLET: ORAL | 90 days supply | Qty: 90 | Fill #2

## 2023-08-09 MED FILL — ENVARSUS XR 4 MG TABLET,EXTENDED RELEASE: ORAL | 30 days supply | Qty: 30 | Fill #1

## 2023-08-09 NOTE — Unmapped (Deleted)
Frank Leach 's entire shipment will be delayed as a result of credit card ending in 0724 declined     I have reached out to the patient  at [phone]  and left a voicemail message.  We will wait for a call back from the patient to reschedule the delivery.  We have not confirmed the new delivery date.     Text and MyChart messages were also sent.

## 2023-08-10 ENCOUNTER — Ambulatory Visit: Admit: 2023-08-10 | Discharge: 2023-08-11 | Payer: MEDICARE

## 2023-08-10 LAB — CBC W/ AUTO DIFF
BASOPHILS ABSOLUTE COUNT: 0.1 10*9/L (ref 0.0–0.1)
BASOPHILS RELATIVE PERCENT: 1.3 %
EOSINOPHILS ABSOLUTE COUNT: 0.2 10*9/L (ref 0.0–0.5)
EOSINOPHILS RELATIVE PERCENT: 4.2 %
HEMATOCRIT: 38.4 % — ABNORMAL LOW (ref 39.0–48.0)
HEMOGLOBIN: 12.3 g/dL — ABNORMAL LOW (ref 12.9–16.5)
LYMPHOCYTES ABSOLUTE COUNT: 1 10*9/L — ABNORMAL LOW (ref 1.1–3.6)
LYMPHOCYTES RELATIVE PERCENT: 22.6 %
MEAN CORPUSCULAR HEMOGLOBIN CONC: 31.9 g/dL — ABNORMAL LOW (ref 32.0–36.0)
MEAN CORPUSCULAR HEMOGLOBIN: 29.3 pg (ref 25.9–32.4)
MEAN CORPUSCULAR VOLUME: 92 fL (ref 77.6–95.7)
MEAN PLATELET VOLUME: 7.9 fL (ref 6.8–10.7)
MONOCYTES ABSOLUTE COUNT: 0.7 10*9/L (ref 0.3–0.8)
MONOCYTES RELATIVE PERCENT: 15.5 %
NEUTROPHILS ABSOLUTE COUNT: 2.4 10*9/L (ref 1.8–7.8)
NEUTROPHILS RELATIVE PERCENT: 56.4 %
NUCLEATED RED BLOOD CELLS: 0 /100{WBCs} (ref <=4)
PLATELET COUNT: 217 10*9/L (ref 150–450)
RED BLOOD CELL COUNT: 4.17 10*12/L — ABNORMAL LOW (ref 4.26–5.60)
RED CELL DISTRIBUTION WIDTH: 15.8 % — ABNORMAL HIGH (ref 12.2–15.2)
WBC ADJUSTED: 4.3 10*9/L (ref 3.6–11.2)

## 2023-08-10 LAB — MAGNESIUM: MAGNESIUM: 1.6 mg/dL (ref 1.6–2.6)

## 2023-08-10 LAB — TACROLIMUS LEVEL, TROUGH: TACROLIMUS, TROUGH: 4.5 ng/mL — ABNORMAL LOW (ref 5.0–15.0)

## 2023-08-10 LAB — RENAL FUNCTION PANEL
ALBUMIN: 4.1 g/dL (ref 3.4–5.0)
ANION GAP: 13 mmol/L (ref 5–14)
BLOOD UREA NITROGEN: 26 mg/dL — ABNORMAL HIGH (ref 9–23)
BUN / CREAT RATIO: 15
CALCIUM: 10.4 mg/dL (ref 8.7–10.4)
CHLORIDE: 109 mmol/L — ABNORMAL HIGH (ref 98–107)
CO2: 22.2 mmol/L (ref 20.0–31.0)
CREATININE: 1.76 mg/dL — ABNORMAL HIGH (ref 0.73–1.18)
EGFR CKD-EPI (2021) MALE: 41 mL/min/{1.73_m2} — ABNORMAL LOW (ref >=60)
GLUCOSE RANDOM: 101 mg/dL (ref 70–179)
PHOSPHORUS: 2.7 mg/dL (ref 2.4–5.1)
POTASSIUM: 4.6 mmol/L (ref 3.4–4.8)
SODIUM: 144 mmol/L (ref 135–145)

## 2023-08-10 MED ORDER — EMPAGLIFLOZIN 25 MG TABLET
ORAL_TABLET | Freq: Every day | ORAL | 11 refills | 30.00 days | Status: CP
Start: 2023-08-10 — End: 2024-08-10

## 2023-08-11 LAB — CMV DNA, QUANTITATIVE, PCR: CMV VIRAL LD: NOT DETECTED

## 2023-08-13 DIAGNOSIS — Z940 Kidney transplant status: Principal | ICD-10-CM

## 2023-08-13 DIAGNOSIS — E119 Type 2 diabetes mellitus without complications: Principal | ICD-10-CM

## 2023-08-13 NOTE — Unmapped (Signed)
[facility] Specialty and Home Delivery Pharmacy Refill Coordination Note    Specialty Medication(s) to be Shipped:   Transplant:  mycophenolic acid 180mg     Other medication(s) to be shipped: mag with proteinb       Frank Leach, DOB: 28-Sep-1950  Phone: [phone] (home)       All above HIPAA information was verified with patient.     Was a Nurse, learning disability used for this call? No    Completed refill call assessment today to schedule patient's medication shipment from the [facility] and Home Delivery Pharmacy  [phone]).  All relevant notes have been reviewed.     Specialty medication(s) and dose(s) confirmed: Regimen is correct and unchanged.   Changes to medications: Petra reports no changes at this time.  Changes to insurance: No  New side effects reported not previously addressed with a pharmacist or physician: None reported  Questions for the pharmacist: No    Confirmed patient received a Conservation officer, historic buildings and a Surveyor, mining with first shipment. The patient will receive a drug information handout for each medication shipped and additional FDA Medication Guides as required.       DISEASE/MEDICATION-SPECIFIC INFORMATION        N/A    SPECIALTY MEDICATION ADHERENCE     Medication Adherence    Patient reported X missed doses in the last month: 0  Specialty Medication: mycophenolate 180mg               Were doses missed due to medication being on hold? No    Mycophenolate 180mg   : 4 days of medicine on hand       REFERRAL TO PHARMACIST     Referral to the pharmacist: Not needed      [facility]     Shipping address confirmed in Epic.       Delivery Scheduled: Yes, Expected medication delivery date: 1/8.     Medication will be delivered via Next Day Courier to the prescription address in Epic WAM.    Westley Gambles   [facility] Specialty and Home Delivery Pharmacy  Specialty Technician

## 2023-08-14 MED FILL — MG-PLUS-PROTEIN 133 MG TABLET: ORAL | 50 days supply | Qty: 100 | Fill #1

## 2023-08-14 MED FILL — MYCOPHENOLATE SODIUM 180 MG TABLET,DELAYED RELEASE: ORAL | 45 days supply | Qty: 180 | Fill #3

## 2023-08-29 DIAGNOSIS — Z940 Kidney transplant status: Principal | ICD-10-CM

## 2023-09-03 NOTE — Unmapped (Signed)
Erroneous message

## 2023-09-05 NOTE — Unmapped (Signed)
[facility] Specialty and Home Delivery Pharmacy Refill Coordination Note    Specialty Medication(s) to be Shipped:   Transplant: Envarsus 4mg     Other medication(s) to be shipped: No additional medications requested for fill at this time     Frank Leach, DOB: 1950/10/31  Phone: [phone] (home)       All above HIPAA information was verified with patient.     Was a Nurse, learning disability used for this call? No    Completed refill call assessment today to schedule patient's medication shipment from the [facility] and Home Delivery Pharmacy  [phone]).  All relevant notes have been reviewed.     Specialty medication(s) and dose(s) confirmed: Regimen is correct and unchanged.   Changes to medications: Frank Leach reports no changes at this time.  Changes to insurance: No  New side effects reported not previously addressed with a pharmacist or physician: None reported  Questions for the pharmacist: No    Confirmed patient received a Conservation officer, historic buildings and a Surveyor, mining with first shipment. The patient will receive a drug information handout for each medication shipped and additional FDA Medication Guides as required.       DISEASE/MEDICATION-SPECIFIC INFORMATION        N/A    SPECIALTY MEDICATION ADHERENCE     Medication Adherence    Patient reported X missed doses in the last month: 0  Specialty Medication: envarsus 4mg   Patient is on additional specialty medications: No  Patient is on more than two specialty medications: No  Any gaps in refill history greater than 2 weeks in the last 3 months: no  Demonstrates understanding of importance of adherence: yes  Informant: patient  Provider-estimated medication adherence level: good  Patient is at risk for Non-Adherence: No  Reasons for non-adherence: no problems identified  Confirmed plan for next specialty medication refill: delivery by pharmacy  Refills needed for supportive medications: not needed          Refill Coordination    Has the Patients' Contact Information Changed: No  Is the Shipping Address Different: No         Were doses missed due to medication being on hold? No    envarsus 4 mg: 3 days of medicine on hand     REFERRAL TO PHARMACIST     Referral to the pharmacist: Not needed      [facility]     Shipping address confirmed in Epic.       Delivery Scheduled: Yes, Expected medication delivery date: 01/31.     Medication will be delivered via Next Day Courier to the prescription address in Epic WAM.    Frank Leach   [facility] Specialty and Home Delivery Pharmacy  Specialty Technician

## 2023-09-06 MED FILL — ENVARSUS XR 4 MG TABLET,EXTENDED RELEASE: ORAL | 10 days supply | Qty: 10 | Fill #2

## 2023-09-10 ENCOUNTER — Ambulatory Visit: Admit: 2023-09-10 | Discharge: 2023-09-11 | Payer: MEDICARE

## 2023-09-10 LAB — CBC W/ AUTO DIFF
BASOPHILS ABSOLUTE COUNT: 0.1 10*9/L (ref 0.0–0.1)
BASOPHILS RELATIVE PERCENT: 1.9 %
EOSINOPHILS ABSOLUTE COUNT: 0.2 10*9/L (ref 0.0–0.5)
EOSINOPHILS RELATIVE PERCENT: 4.5 %
HEMATOCRIT: 40 % (ref 39.0–48.0)
HEMOGLOBIN: 12.5 g/dL — ABNORMAL LOW (ref 12.9–16.5)
LYMPHOCYTES ABSOLUTE COUNT: 1.1 10*9/L (ref 1.1–3.6)
LYMPHOCYTES RELATIVE PERCENT: 22.2 %
MEAN CORPUSCULAR HEMOGLOBIN CONC: 31.2 g/dL — ABNORMAL LOW (ref 32.0–36.0)
MEAN CORPUSCULAR HEMOGLOBIN: 28.8 pg (ref 25.9–32.4)
MEAN CORPUSCULAR VOLUME: 92.3 fL (ref 77.6–95.7)
MEAN PLATELET VOLUME: 8 fL (ref 6.8–10.7)
MONOCYTES ABSOLUTE COUNT: 0.8 10*9/L (ref 0.3–0.8)
MONOCYTES RELATIVE PERCENT: 16.6 %
NEUTROPHILS ABSOLUTE COUNT: 2.6 10*9/L (ref 1.8–7.8)
NEUTROPHILS RELATIVE PERCENT: 54.8 %
NUCLEATED RED BLOOD CELLS: 0 /100{WBCs} (ref <=4)
PLATELET COUNT: 214 10*9/L (ref 150–450)
RED BLOOD CELL COUNT: 4.33 10*12/L (ref 4.26–5.60)
RED CELL DISTRIBUTION WIDTH: 15.2 % (ref 12.2–15.2)
WBC ADJUSTED: 4.8 10*9/L (ref 3.6–11.2)

## 2023-09-10 LAB — RENAL FUNCTION PANEL
ALBUMIN: 3.9 g/dL (ref 3.4–5.0)
ANION GAP: 12 mmol/L (ref 5–14)
BLOOD UREA NITROGEN: 22 mg/dL (ref 9–23)
BUN / CREAT RATIO: 14
CALCIUM: 10.1 mg/dL (ref 8.7–10.4)
CHLORIDE: 106 mmol/L (ref 98–107)
CO2: 26.8 mmol/L (ref 20.0–31.0)
CREATININE: 1.62 mg/dL — ABNORMAL HIGH (ref 0.73–1.18)
EGFR CKD-EPI (2021) MALE: 45 mL/min/{1.73_m2} — ABNORMAL LOW (ref >=60)
GLUCOSE RANDOM: 117 mg/dL (ref 70–179)
PHOSPHORUS: 2.4 mg/dL (ref 2.4–5.1)
POTASSIUM: 4.3 mmol/L (ref 3.4–4.8)
SODIUM: 145 mmol/L (ref 135–145)

## 2023-09-10 LAB — HEMOGLOBIN A1C
ESTIMATED AVERAGE GLUCOSE: 126 mg/dL
HEMOGLOBIN A1C: 6 % — ABNORMAL HIGH (ref 4.8–5.6)

## 2023-09-10 LAB — CMV DNA, QUANTITATIVE, PCR
CMV QUANT LOG(10): 1.56 {Log_IU}/mL — ABNORMAL HIGH (ref <0.00)
CMV QUANT: 36 [IU]/mL — ABNORMAL HIGH (ref <0)
CMV VIRAL LD: DETECTED — AB

## 2023-09-10 LAB — MAGNESIUM: MAGNESIUM: 1.4 mg/dL — ABNORMAL LOW (ref 1.6–2.6)

## 2023-09-10 LAB — TACROLIMUS LEVEL, TROUGH: TACROLIMUS, TROUGH: 7 ng/mL (ref 5.0–15.0)

## 2023-09-26 DIAGNOSIS — Z940 Kidney transplant status: Principal | ICD-10-CM

## 2023-09-26 DIAGNOSIS — Z79899 Other long term (current) drug therapy: Principal | ICD-10-CM

## 2023-09-26 MED ORDER — OLMESARTAN 40 MG TABLET
ORAL_TABLET | Freq: Every day | ORAL | 3 refills | 90.00 days | Status: CP
Start: 2023-09-26 — End: 2024-09-25
  Filled 2023-10-01: qty 90, 90d supply, fill #0

## 2023-09-26 MED ORDER — MYCOPHENOLATE SODIUM 180 MG TABLET,DELAYED RELEASE
ORAL_TABLET | Freq: Two times a day (BID) | ORAL | 3 refills | 45.00 days | Status: CP
Start: 2023-09-26 — End: ?
  Filled 2023-10-01: qty 180, 45d supply, fill #0

## 2023-09-26 NOTE — Unmapped (Addendum)
 [facility] Specialty and Home Delivery Pharmacy Refill Coordination Note    Specialty Medication(s) to be Shipped:   Transplant: mycophenolate (MYFORTIC) 180 MG EC tablet     Other medication(s) to be shipped:  allopurinol, olmesartan, magnesium     Frank Leach, DOB: 1951-01-28  Phone: [phone] (home)       All above HIPAA information was verified with patient.     Was a Nurse, learning disability used for this call? No    Completed refill call assessment today to schedule patient's medication shipment from the [facility] and Home Delivery Pharmacy  [phone]).  All relevant notes have been reviewed.     Specialty medication(s) and dose(s) confirmed: Regimen is correct and unchanged.   Changes to medications: Anguel reports no changes at this time.  Changes to insurance: No  New side effects reported not previously addressed with a pharmacist or physician: None reported  Questions for the pharmacist: No    Confirmed patient received a Conservation officer, historic buildings and a Surveyor, mining with first shipment. The patient will receive a drug information handout for each medication shipped and additional FDA Medication Guides as required.       DISEASE/MEDICATION-SPECIFIC INFORMATION        N/A    SPECIALTY MEDICATION ADHERENCE     Medication Adherence    Patient reported X missed doses in the last month: 0  Specialty Medication: mycophenolate (MYFORTIC) 180 MG EC tablet  Patient is on additional specialty medications: No  Informant: patient                Were doses missed due to medication being on hold? No    mycophenolate (MYFORTIC) 180 MG EC tablet : 7 days of medicine on hand     REFERRAL TO PHARMACIST     Referral to the pharmacist: Not needed      [facility]     Shipping address confirmed in Epic.       Delivery Scheduled: Yes, Expected medication delivery date: 2/24.  However, Rx request for refills was sent to the provider as there are none remaining.     Medication will be delivered via Same Day Courier to the prescription address in Epic WAM.    [facility]

## 2023-10-01 MED FILL — MG-PLUS-PROTEIN 133 MG TABLET: ORAL | 50 days supply | Qty: 100 | Fill #2

## 2023-10-01 MED FILL — ALLOPURINOL 100 MG TABLET: ORAL | 90 days supply | Qty: 90 | Fill #1

## 2023-10-08 ENCOUNTER — Ambulatory Visit: Admit: 2023-10-08 | Discharge: 2023-10-09 | Payer: MEDICARE

## 2023-10-08 LAB — CBC W/ AUTO DIFF
BASOPHILS ABSOLUTE COUNT: 0.1 10*9/L (ref 0.0–0.1)
BASOPHILS RELATIVE PERCENT: 1.3 %
EOSINOPHILS ABSOLUTE COUNT: 0.2 10*9/L (ref 0.0–0.5)
EOSINOPHILS RELATIVE PERCENT: 4.9 %
HEMATOCRIT: 39.6 % (ref 39.0–48.0)
HEMOGLOBIN: 12.6 g/dL — ABNORMAL LOW (ref 12.9–16.5)
LYMPHOCYTES ABSOLUTE COUNT: 1.1 10*9/L (ref 1.1–3.6)
LYMPHOCYTES RELATIVE PERCENT: 23.6 %
MEAN CORPUSCULAR HEMOGLOBIN CONC: 31.9 g/dL — ABNORMAL LOW (ref 32.0–36.0)
MEAN CORPUSCULAR HEMOGLOBIN: 29.3 pg (ref 25.9–32.4)
MEAN CORPUSCULAR VOLUME: 91.9 fL (ref 77.6–95.7)
MEAN PLATELET VOLUME: 8 fL (ref 6.8–10.7)
MONOCYTES ABSOLUTE COUNT: 0.7 10*9/L (ref 0.3–0.8)
MONOCYTES RELATIVE PERCENT: 15.1 %
NEUTROPHILS ABSOLUTE COUNT: 2.6 10*9/L (ref 1.8–7.8)
NEUTROPHILS RELATIVE PERCENT: 55.1 %
NUCLEATED RED BLOOD CELLS: 0 /100{WBCs} (ref <=4)
PLATELET COUNT: 217 10*9/L (ref 150–450)
RED BLOOD CELL COUNT: 4.31 10*12/L (ref 4.26–5.60)
RED CELL DISTRIBUTION WIDTH: 15.7 % — ABNORMAL HIGH (ref 12.2–15.2)
WBC ADJUSTED: 4.8 10*9/L (ref 3.6–11.2)

## 2023-10-08 LAB — RENAL FUNCTION PANEL
ALBUMIN: 3.9 g/dL (ref 3.4–5.0)
ANION GAP: 12 mmol/L (ref 5–14)
BLOOD UREA NITROGEN: 26 mg/dL — ABNORMAL HIGH (ref 9–23)
BUN / CREAT RATIO: 16
CALCIUM: 10.7 mg/dL — ABNORMAL HIGH (ref 8.7–10.4)
CHLORIDE: 108 mmol/L — ABNORMAL HIGH (ref 98–107)
CO2: 23.7 mmol/L (ref 20.0–31.0)
CREATININE: 1.67 mg/dL — ABNORMAL HIGH (ref 0.73–1.18)
EGFR CKD-EPI (2021) MALE: 43 mL/min/{1.73_m2} — ABNORMAL LOW (ref >=60)
GLUCOSE RANDOM: 116 mg/dL (ref 70–179)
PHOSPHORUS: 2.2 mg/dL — ABNORMAL LOW (ref 2.4–5.1)
POTASSIUM: 4.7 mmol/L (ref 3.4–4.8)
SODIUM: 144 mmol/L (ref 135–145)

## 2023-10-08 LAB — MAGNESIUM: MAGNESIUM: 1.4 mg/dL — ABNORMAL LOW (ref 1.6–2.6)

## 2023-10-08 LAB — TACROLIMUS LEVEL, TROUGH: TACROLIMUS, TROUGH: 6.2 ng/mL (ref 5.0–15.0)

## 2023-10-09 LAB — CMV DNA, QUANTITATIVE, PCR: CMV VIRAL LD: NOT DETECTED

## 2023-10-18 NOTE — Unmapped (Signed)
 [facility] Specialty and Home Delivery Pharmacy Refill Coordination Note    Specialty Medication(s) to be Shipped:   Transplant: Mycophenolate 180 mg- patient declined refill at this time due to having over 14 days on hand.    Other medication(s) to be shipped:  amlodipine, atorvastatin, and metformin     Frank Leach, DOB: 08-Nov-1950  Phone: [phone] (home)       All above HIPAA information was verified with patient.     Was a Nurse, learning disability used for this call? No    Completed refill call assessment today to schedule patient's medication shipment from the [facility] and Home Delivery Pharmacy  [phone]).  All relevant notes have been reviewed.     Specialty medication(s) and dose(s) confirmed: Regimen is correct and unchanged.   Changes to medications: Fionn reports no changes at this time.  Changes to insurance: No  New side effects reported not previously addressed with a pharmacist or physician: None reported  Questions for the pharmacist: No    Confirmed patient received a Conservation officer, historic buildings and a Surveyor, mining with first shipment. The patient will receive a drug information handout for each medication shipped and additional FDA Medication Guides as required.       DISEASE/MEDICATION-SPECIFIC INFORMATION        N/A    SPECIALTY MEDICATION ADHERENCE              Were doses missed due to medication being on hold? No         REFERRAL TO PHARMACIST     Referral to the pharmacist: Not needed      [facility]     Shipping address confirmed in Epic.     Cost and Payment: Patient has a copay of $1.28. They are aware and have authorized the pharmacy to charge the credit card on file.    Delivery Scheduled: Yes, Expected medication delivery date: 10/23/23.     Medication will be delivered via UPS to the prescription address in Epic WAM.    [name]   [facility] Specialty and Home Delivery Pharmacy  Specialty Technician

## 2023-10-22 MED FILL — METFORMIN ER 500 MG TABLET,EXTENDED RELEASE 24 HR: ORAL | 90 days supply | Qty: 180 | Fill #2

## 2023-10-22 MED FILL — AMLODIPINE 5 MG TABLET: ORAL | 90 days supply | Qty: 90 | Fill #3

## 2023-10-22 MED FILL — ATORVASTATIN 40 MG TABLET: ORAL | 90 days supply | Qty: 90 | Fill #1

## 2023-11-06 ENCOUNTER — Ambulatory Visit: Admit: 2023-11-06 | Discharge: 2023-11-07

## 2023-11-06 LAB — CBC W/ AUTO DIFF
BASOPHILS ABSOLUTE COUNT: 0.1 10*9/L (ref 0.0–0.1)
BASOPHILS RELATIVE PERCENT: 1.7 %
EOSINOPHILS ABSOLUTE COUNT: 0.3 10*9/L (ref 0.0–0.5)
EOSINOPHILS RELATIVE PERCENT: 5.3 %
HEMATOCRIT: 37.9 % — ABNORMAL LOW (ref 39.0–48.0)
HEMOGLOBIN: 12.1 g/dL — ABNORMAL LOW (ref 12.9–16.5)
LYMPHOCYTES ABSOLUTE COUNT: 1.3 10*9/L (ref 1.1–3.6)
LYMPHOCYTES RELATIVE PERCENT: 25.2 %
MEAN CORPUSCULAR HEMOGLOBIN CONC: 31.9 g/dL — ABNORMAL LOW (ref 32.0–36.0)
MEAN CORPUSCULAR HEMOGLOBIN: 29.2 pg (ref 25.9–32.4)
MEAN CORPUSCULAR VOLUME: 91.5 fL (ref 77.6–95.7)
MEAN PLATELET VOLUME: 7.8 fL (ref 6.8–10.7)
MONOCYTES ABSOLUTE COUNT: 0.9 10*9/L — ABNORMAL HIGH (ref 0.3–0.8)
MONOCYTES RELATIVE PERCENT: 16.9 %
NEUTROPHILS ABSOLUTE COUNT: 2.7 10*9/L (ref 1.8–7.8)
NEUTROPHILS RELATIVE PERCENT: 50.9 %
NUCLEATED RED BLOOD CELLS: 0 /100{WBCs} (ref <=4)
PLATELET COUNT: 231 10*9/L (ref 150–450)
RED BLOOD CELL COUNT: 4.15 10*12/L — ABNORMAL LOW (ref 4.26–5.60)
RED CELL DISTRIBUTION WIDTH: 15.8 % — ABNORMAL HIGH (ref 12.2–15.2)
WBC ADJUSTED: 5.2 10*9/L (ref 3.6–11.2)

## 2023-11-06 LAB — RENAL FUNCTION PANEL
ALBUMIN: 4 g/dL (ref 3.4–5.0)
ANION GAP: 13 mmol/L (ref 5–14)
BLOOD UREA NITROGEN: 33 mg/dL — ABNORMAL HIGH (ref 9–23)
BUN / CREAT RATIO: 21
CALCIUM: 10.4 mg/dL (ref 8.7–10.4)
CHLORIDE: 109 mmol/L — ABNORMAL HIGH (ref 98–107)
CO2: 23 mmol/L (ref 20.0–31.0)
CREATININE: 1.6 mg/dL — ABNORMAL HIGH (ref 0.73–1.18)
EGFR CKD-EPI (2021) MALE: 46 mL/min/1.73m2 — ABNORMAL LOW (ref >=60)
GLUCOSE RANDOM: 111 mg/dL (ref 70–179)
PHOSPHORUS: 2.7 mg/dL (ref 2.4–5.1)
POTASSIUM: 4.6 mmol/L (ref 3.4–4.8)
SODIUM: 145 mmol/L (ref 135–145)

## 2023-11-06 LAB — TACROLIMUS LEVEL, TROUGH: TACROLIMUS, TROUGH: 5.7 ng/mL (ref 5.0–15.0)

## 2023-11-06 LAB — MAGNESIUM: MAGNESIUM: 1.5 mg/dL — ABNORMAL LOW (ref 1.6–2.6)

## 2023-11-07 LAB — CMV DNA, QUANTITATIVE, PCR
CMV QUANT LOG(10): 1.79 {Log_IU}/mL — ABNORMAL HIGH (ref <0.00)
CMV QUANT: 62 [IU]/mL — ABNORMAL HIGH (ref <0)
CMV VIRAL LD: DETECTED — AB

## 2023-11-12 NOTE — Unmapped (Signed)
 [name] Specialty and Home Delivery Pharmacy Refill Coordination Note    Specialty Medication(s) to be Shipped:   Transplant: Myfortic 180mg     Other medication(s) to be shipped:  magnesium (amino acid chelate) 133 mg tablet (magnesium oxide-Mg AA chelate)     Frank Leach, DOB: [birth year]  Phone: [phone] (home)       All above HIPAA information was verified with patient.     Was a Nurse, learning disability used for this call? No    Completed refill call assessment today to schedule patient's medication shipment from the [facility] and Home Delivery Pharmacy  [phone]).  All relevant notes have been reviewed.     Specialty medication(s) and dose(s) confirmed: Regimen is correct and unchanged.   Changes to medications: [name] reports no changes at this time.  Changes to insurance: No  New side effects reported not previously addressed with a pharmacist Leach physician: None reported  Questions for the pharmacist: No    Confirmed patient received a Conservation officer, historic buildings and a Surveyor, mining with first shipment. The patient will receive a drug information handout for each medication shipped and additional FDA Medication Guides as required.       DISEASE/MEDICATION-SPECIFIC INFORMATION        N/A    SPECIALTY MEDICATION ADHERENCE     Medication Adherence    Patient reported X missed doses in the last month: 0  Specialty Medication: mycophenolate 180 MG EC tablet (MYFORTIC)  Patient is on additional specialty medications: No  Patient is on more than two specialty medications: No              Were doses missed due to medication being on hold? No    mycophenolate 180 MG EC tablet (MYFORTIC)  : 4 days of medicine on hand       REFERRAL TO PHARMACIST     Referral to the pharmacist: Not needed      [facility]     Shipping address confirmed in Epic.     Cost and Payment: Patient has a copay of $17.44. They are aware and have authorized the pharmacy to charge the credit card on file.    Delivery Scheduled: Yes, Expected medication delivery date: 11/13/23.     Medication will be delivered via Same Day Courier to the prescription address in Epic WAM.    [name]   [facility] Specialty and Home Delivery Pharmacy  Specialty Technician

## 2023-11-13 MED FILL — MYCOPHENOLATE SODIUM 180 MG TABLET,DELAYED RELEASE: ORAL | 45 days supply | Qty: 180 | Fill #1

## 2023-11-13 MED FILL — MG-PLUS-PROTEIN 133 MG TABLET: ORAL | 50 days supply | Qty: 100 | Fill #3

## 2023-12-03 DIAGNOSIS — N184 Chronic kidney disease, stage 4 (severe): Principal | ICD-10-CM

## 2023-12-03 DIAGNOSIS — Z940 Kidney transplant status: Principal | ICD-10-CM

## 2023-12-03 DIAGNOSIS — Z79899 Other long term (current) drug therapy: Principal | ICD-10-CM

## 2023-12-10 ENCOUNTER — Ambulatory Visit: Admit: 2023-12-10 | Discharge: 2023-12-11 | Payer: Medicare (Managed Care)

## 2023-12-10 DIAGNOSIS — Z940 Kidney transplant status: Principal | ICD-10-CM

## 2023-12-10 LAB — BASIC METABOLIC PANEL
ANION GAP: 13 mmol/L (ref 5–14)
BLOOD UREA NITROGEN: 31 mg/dL — ABNORMAL HIGH (ref 9–23)
BUN / CREAT RATIO: 19
CALCIUM: 11.2 mg/dL — ABNORMAL HIGH (ref 8.7–10.4)
CHLORIDE: 110 mmol/L — ABNORMAL HIGH (ref 98–107)
CO2: 23.3 mmol/L (ref 20.0–31.0)
CREATININE: 1.64 mg/dL — ABNORMAL HIGH (ref 0.73–1.18)
EGFR CKD-EPI (2021) MALE: 44 mL/min/1.73m2 — ABNORMAL LOW (ref >=60)
GLUCOSE RANDOM: 116 mg/dL (ref 70–179)
POTASSIUM: 5 mmol/L — ABNORMAL HIGH (ref 3.4–4.8)
SODIUM: 146 mmol/L — ABNORMAL HIGH (ref 135–145)

## 2023-12-10 LAB — CBC W/ AUTO DIFF
BASOPHILS ABSOLUTE COUNT: 0.1 10*9/L (ref 0.0–0.1)
BASOPHILS RELATIVE PERCENT: 1.2 %
EOSINOPHILS ABSOLUTE COUNT: 0.2 10*9/L (ref 0.0–0.5)
EOSINOPHILS RELATIVE PERCENT: 4.1 %
HEMATOCRIT: 37.8 % — ABNORMAL LOW (ref 39.0–48.0)
HEMOGLOBIN: 12.4 g/dL — ABNORMAL LOW (ref 12.9–16.5)
LYMPHOCYTES ABSOLUTE COUNT: 0.9 10*9/L — ABNORMAL LOW (ref 1.1–3.6)
LYMPHOCYTES RELATIVE PERCENT: 19.2 %
MEAN CORPUSCULAR HEMOGLOBIN CONC: 32.9 g/dL (ref 32.0–36.0)
MEAN CORPUSCULAR HEMOGLOBIN: 29.4 pg (ref 25.9–32.4)
MEAN CORPUSCULAR VOLUME: 89.5 fL (ref 77.6–95.7)
MEAN PLATELET VOLUME: 7.9 fL (ref 6.8–10.7)
MONOCYTES ABSOLUTE COUNT: 0.7 10*9/L (ref 0.3–0.8)
MONOCYTES RELATIVE PERCENT: 14.2 %
NEUTROPHILS ABSOLUTE COUNT: 3 10*9/L (ref 1.8–7.8)
NEUTROPHILS RELATIVE PERCENT: 61.3 %
NUCLEATED RED BLOOD CELLS: 0 /100{WBCs} (ref <=4)
PLATELET COUNT: 205 10*9/L (ref 150–450)
RED BLOOD CELL COUNT: 4.23 10*12/L — ABNORMAL LOW (ref 4.26–5.60)
RED CELL DISTRIBUTION WIDTH: 15.4 % — ABNORMAL HIGH (ref 12.2–15.2)
WBC ADJUSTED: 4.9 10*9/L (ref 3.6–11.2)

## 2023-12-10 LAB — HEPATIC FUNCTION PANEL
ALBUMIN: 4.2 g/dL (ref 3.4–5.0)
ALKALINE PHOSPHATASE: 88 U/L (ref 46–116)
ALT (SGPT): 16 U/L (ref 10–49)
AST (SGOT): 19 U/L (ref <=34)
BILIRUBIN DIRECT: 0.2 mg/dL (ref 0.00–0.30)
BILIRUBIN TOTAL: 0.5 mg/dL (ref 0.3–1.2)
PROTEIN TOTAL: 7.2 g/dL (ref 5.7–8.2)

## 2023-12-10 LAB — IRON PANEL
IRON SATURATION (CALC): 48 %
IRON: 99 ug/dL (ref 65–175)
TOTAL IRON BINDING CAPACITY (CALC): 205.4 ug/dL — ABNORMAL LOW (ref 250.0–425.0)
TRANSFERRIN: 163 mg/dL — ABNORMAL LOW (ref 215.0–365.0)

## 2023-12-10 LAB — PHOSPHORUS: PHOSPHORUS: 3.1 mg/dL (ref 2.4–5.1)

## 2023-12-10 LAB — MAGNESIUM: MAGNESIUM: 1.7 mg/dL (ref 1.6–2.6)

## 2023-12-10 LAB — FERRITIN: FERRITIN: 491.8 ng/mL — ABNORMAL HIGH (ref 10.5–307.3)

## 2023-12-10 LAB — CMV DNA, QUANTITATIVE, PCR: CMV VIRAL LD: NOT DETECTED

## 2023-12-10 LAB — PARATHYROID HORMONE (PTH): PARATHYROID HORMONE INTACT: 188.5 pg/mL — ABNORMAL HIGH (ref 18.4–80.1)

## 2023-12-10 LAB — TACROLIMUS LEVEL, TROUGH: TACROLIMUS, TROUGH: 4.7 ng/mL — ABNORMAL LOW (ref 5.0–15.0)

## 2023-12-10 LAB — BK VIRUS QUANTITATIVE PCR, BLOOD: BK BLOOD RESULT: NOT DETECTED

## 2023-12-12 LAB — VITAMIN D 25 HYDROXY: VITAMIN D, TOTAL (25OH): 30.1 ng/mL (ref 20.0–80.0)

## 2023-12-20 NOTE — Unmapped (Signed)
 [facility] Specialty and Home Delivery Pharmacy Refill Coordination Note    Specialty Medication(s) to be Shipped:   Transplant: Myfortic 180mg     Other medication(s) to be shipped: allopurinol 100 MG tablet (ZYLOPRIM)olmesartan 40 MG tablet (BENICAR)     Frank Leach, DOB: 1951-07-23  Phone: [phone] (home)       All above HIPAA information was verified with patient.     Was a Nurse, learning disability used for this call? No    Completed refill call assessment today to schedule patient's medication shipment from the [facility] and Home Delivery Pharmacy  [phone]).  All relevant notes have been reviewed.     Specialty medication(s) and dose(s) confirmed: Regimen is correct and unchanged.   Changes to medications: Antonia reports no changes at this time.  Changes to insurance: No  New side effects reported not previously addressed with a pharmacist or physician: None reported  Questions for the pharmacist: No    Confirmed patient received a Conservation officer, historic buildings and a Surveyor, mining with first shipment. The patient will receive a drug information handout for each medication shipped and additional FDA Medication Guides as required.       DISEASE/MEDICATION-SPECIFIC INFORMATION        N/A    SPECIALTY MEDICATION ADHERENCE     Medication Adherence    Patient reported X missed doses in the last month: 0  Specialty Medication: mycophenolate 180 MG EC tablet (MYFORTIC)  Patient is on additional specialty medications: No  Patient is on more than two specialty medications: No              Were doses missed due to medication being on hold? No    mycophenolate 180 MG EC tablet (MYFORTIC)  : 10 days of medicine on hand       REFERRAL TO PHARMACIST     Referral to the pharmacist: Not needed      [facility]     Shipping address confirmed in Epic.     Cost and Payment: Patient has a copay of $13.45. They are aware and have authorized the pharmacy to charge the credit card on file.    Delivery Scheduled: Yes, Expected medication delivery date: 12/25/23.     Medication will be delivered via Same Day Courier to the prescription address in Epic WAM.    [name]   [facility] Specialty and Home Delivery Pharmacy  Specialty Technician

## 2023-12-25 MED FILL — ALLOPURINOL 100 MG TABLET: ORAL | 90 days supply | Qty: 90 | Fill #2

## 2023-12-25 MED FILL — OLMESARTAN 40 MG TABLET: ORAL | 90 days supply | Qty: 90 | Fill #1

## 2023-12-25 MED FILL — MYCOPHENOLATE SODIUM 180 MG TABLET,DELAYED RELEASE: ORAL | 45 days supply | Qty: 180 | Fill #2

## 2024-01-07 ENCOUNTER — Ambulatory Visit: Admit: 2024-01-07 | Discharge: 2024-01-08 | Payer: Medicare (Managed Care)

## 2024-01-07 LAB — CBC W/ AUTO DIFF
BASOPHILS ABSOLUTE COUNT: 0.1 10*9/L (ref 0.0–0.1)
BASOPHILS RELATIVE PERCENT: 1.4 %
EOSINOPHILS ABSOLUTE COUNT: 0.2 10*9/L (ref 0.0–0.5)
EOSINOPHILS RELATIVE PERCENT: 4.6 %
HEMATOCRIT: 37.5 % — ABNORMAL LOW (ref 39.0–48.0)
HEMOGLOBIN: 12.2 g/dL — ABNORMAL LOW (ref 12.9–16.5)
LYMPHOCYTES ABSOLUTE COUNT: 1 10*9/L — ABNORMAL LOW (ref 1.1–3.6)
LYMPHOCYTES RELATIVE PERCENT: 19.8 %
MEAN CORPUSCULAR HEMOGLOBIN CONC: 32.6 g/dL (ref 32.0–36.0)
MEAN CORPUSCULAR HEMOGLOBIN: 29 pg (ref 25.9–32.4)
MEAN CORPUSCULAR VOLUME: 89 fL (ref 77.6–95.7)
MEAN PLATELET VOLUME: 7.9 fL (ref 6.8–10.7)
MONOCYTES ABSOLUTE COUNT: 0.9 10*9/L — ABNORMAL HIGH (ref 0.3–0.8)
MONOCYTES RELATIVE PERCENT: 16.7 %
NEUTROPHILS ABSOLUTE COUNT: 3 10*9/L (ref 1.8–7.8)
NEUTROPHILS RELATIVE PERCENT: 57.5 %
NUCLEATED RED BLOOD CELLS: 0 /100{WBCs} (ref <=4)
PLATELET COUNT: 225 10*9/L (ref 150–450)
RED BLOOD CELL COUNT: 4.21 10*12/L — ABNORMAL LOW (ref 4.26–5.60)
RED CELL DISTRIBUTION WIDTH: 15.6 % — ABNORMAL HIGH (ref 12.2–15.2)
WBC ADJUSTED: 5.3 10*9/L (ref 3.6–11.2)

## 2024-01-07 LAB — RENAL FUNCTION PANEL
ALBUMIN: 4.1 g/dL (ref 3.4–5.0)
ANION GAP: 11 mmol/L (ref 5–14)
BLOOD UREA NITROGEN: 18 mg/dL (ref 9–23)
BUN / CREAT RATIO: 12
CALCIUM: 10.2 mg/dL (ref 8.7–10.4)
CHLORIDE: 108 mmol/L — ABNORMAL HIGH (ref 98–107)
CO2: 25.4 mmol/L (ref 20.0–31.0)
CREATININE: 1.46 mg/dL — ABNORMAL HIGH (ref 0.73–1.18)
EGFR CKD-EPI (2021) MALE: 51 mL/min/1.73m2 — ABNORMAL LOW (ref >=60)
GLUCOSE RANDOM: 107 mg/dL (ref 70–179)
PHOSPHORUS: 1.9 mg/dL — ABNORMAL LOW (ref 2.4–5.1)
POTASSIUM: 4.9 mmol/L — ABNORMAL HIGH (ref 3.4–4.8)
SODIUM: 144 mmol/L (ref 135–145)

## 2024-01-07 LAB — MAGNESIUM: MAGNESIUM: 1.5 mg/dL — ABNORMAL LOW (ref 1.6–2.6)

## 2024-01-07 LAB — TACROLIMUS LEVEL, TROUGH: TACROLIMUS, TROUGH: 4.8 ng/mL — ABNORMAL LOW (ref 5.0–15.0)

## 2024-01-08 DIAGNOSIS — Z940 Kidney transplant status: Principal | ICD-10-CM

## 2024-01-08 DIAGNOSIS — Z79899 Other long term (current) drug therapy: Principal | ICD-10-CM

## 2024-01-08 DIAGNOSIS — E119 Type 2 diabetes mellitus without complications: Principal | ICD-10-CM

## 2024-01-08 LAB — LIPID PANEL
CHOLESTEROL: 89 mg/dL (ref <200)
HDL CHOLESTEROL: 36 mg/dL — ABNORMAL LOW (ref >40)
LDL CHOLESTEROL CALCULATED: 42 mg/dL (ref <100)
NON-HDL CHOLESTEROL: 53 mg/dL (ref <130)
TRIGLYCERIDES: 83 mg/dL (ref <150)

## 2024-01-08 LAB — CMV DNA, QUANTITATIVE, PCR: CMV VIRAL LD: NOT DETECTED

## 2024-01-09 LAB — HEMOGLOBIN A1C
ESTIMATED AVERAGE GLUCOSE: 131 mg/dL
HEMOGLOBIN A1C: 6.2 % — ABNORMAL HIGH (ref 4.8–5.6)

## 2024-01-14 MED ORDER — AMLODIPINE 5 MG TABLET
ORAL_TABLET | Freq: Every day | ORAL | 3 refills | 90.00000 days | Status: CP
Start: 2024-01-14 — End: 2025-01-13
  Filled 2024-01-16: qty 90, 90d supply, fill #0

## 2024-01-16 MED FILL — MG-PLUS-PROTEIN 133 MG TABLET: ORAL | 50 days supply | Qty: 100 | Fill #4

## 2024-01-18 ENCOUNTER — Ambulatory Visit: Admit: 2024-01-18 | Discharge: 2024-01-18 | Payer: Medicare (Managed Care) | Attending: Nephrology | Primary: Nephrology

## 2024-01-18 ENCOUNTER — Ambulatory Visit: Admit: 2024-01-18 | Discharge: 2024-01-18 | Payer: Medicare (Managed Care)

## 2024-01-18 DIAGNOSIS — D84821 Immunosuppression due to drug therapy (HHS-HCC): Principal | ICD-10-CM

## 2024-01-18 DIAGNOSIS — Z940 Kidney transplant status: Principal | ICD-10-CM

## 2024-01-18 DIAGNOSIS — Z79899 Other long term (current) drug therapy: Principal | ICD-10-CM

## 2024-01-18 MED ORDER — TACROLIMUS XR 1 MG TABLET,EXTENDED RELEASE 24 HR
ORAL_TABLET | Freq: Every day | ORAL | 3 refills | 90.00000 days | Status: CP
Start: 2024-01-18 — End: 2025-01-17

## 2024-01-18 MED ORDER — AMLODIPINE 10 MG TABLET
ORAL_TABLET | Freq: Every day | ORAL | 3 refills | 90.00000 days | Status: CP
Start: 2024-01-18 — End: 2025-01-17
  Filled 2024-01-30: qty 90, 90d supply, fill #0

## 2024-01-18 MED ORDER — ENVARSUS XR 4 MG TABLET,EXTENDED RELEASE
ORAL_TABLET | Freq: Every day | ORAL | 11 refills | 60.00000 days | Status: CP
Start: 2024-01-18 — End: ?

## 2024-01-18 NOTE — Unmapped (Signed)
 Transplant Coordinator, Clinic Visit   Pt seen today by transplant nephrology for follow up, reviewed medications and symptoms.          01/18/24 1232   BP: 152/78   Pulse: 60   Temp: 36.6 ??C (97.8 ??F)   Weight: 73.2 kg (161 lb 6.4 oz)   PainSc: 0-No pain       Assessment  BP: home bp's 130's-140's    Taking Envarsus  5 mg daily    Unable to afford Jardiance , only taking Metformin  500 mg    Feels well overall, last labs 6/2    Good appetite; reports adequate hydration.     Any new medications? none  Immunosuppressant last taken: 0900 this am      Functional Score: 100   Normal no complaints; no evidence of  disease.       I spent a total of 20 minutes with Frank Leach reviewing medications and symptoms.

## 2024-01-18 NOTE — Unmapped (Signed)
 Transplant Nephrology Clinic Visit    History of Present Illness    73 y.o. male here for follow up after kidney transplantation.      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 Interval decrease in linear soft tissue within the right nephrectomy bed, potentially postsurgical or fat necrosis. Unchanged linear tissue within the left nephrectomy bed. No suspicious enhancing lesions are visualized within the surgical beds     Post-Txp 06/25/2019 (txp kidney only)  The renal transplant was located in the left lower quadrant. Normal size and echogenicity.  No solid masses or calculi. Trace perinephric fluid adjacent to the lower pole of the kidney. Mild pelviectasis  - Perfusion: Using power Doppler, normal perfusion was seen throughout the renal parenchyma.  - Resistive indices in the renal transplant are stable compared with prior examination.  - Main renal artery/iliac artery: Patent. Again noted are 3 renal arteries. Resistive indices within the renal arteries are stable to minimally increased, now at or just above normal limits.  - Main renal vein/iliac vein: Patent       Subjective/Interval:       BP at home 130s-140s          Review of Systems      Otherwise as per HPI, all other systems reviewed and are negative.    Past Medical History, Surgical History, Family History, and Social History reviewed in electronic record        Medications  Current Outpatient Medications   Medication Sig Dispense Refill    acetaminophen  (TYLENOL ) 500 MG tablet Take 1-2 tablets (500-1,000 mg total) by mouth every six (6) hours as needed for pain or fever (> 38C). 100 tablet 0    allopurinol  (ZYLOPRIM ) 100 MG tablet Take 1 tablet (100 mg total) by mouth every evening. 90 tablet 3    amlodipine  (NORVASC ) 5 MG tablet Take 1 tablet (5 mg total) by mouth daily. 90 tablet 3    atorvastatin  (LIPITOR ) 40 MG tablet Take 1 tablet (40 mg total) by mouth daily. 90 tablet 3    empagliflozin  (JARDIANCE ) 25 mg tablet Take 1 tablet (25 mg total) by mouth daily. 30 tablet 11    ENVARSUS  XR 4 mg Tb24 extended release tablet Take 1 tablet (4 mg total) by mouth daily. Take in addition to 1 mg tablet for a total daily dose of 5 mg. 30 tablet 11    magnesium oxide-Mg AA chelate (MAGNESIUM, AMINO ACID CHELATE,) 133 mg Take 1 tablet by mouth two (2) times a day. 100 tablet 11    metFORMIN  (GLUCOPHAGE -XR) 500 MG 24 hr tablet Take 1 tablet (500 mg total) by mouth in the morning. 180 tablet 3    metFORMIN  (GLUCOPHAGE -XR) 500 MG 24 hr tablet Take 2 tablets (1,000 mg total) by mouth daily. 180 tablet 3    mycophenolate  (MYFORTIC ) 180 MG EC tablet Take 2 tablets (360 mg total) by mouth Two (2)  times a day. 180 tablet 3    olmesartan  (BENICAR ) 40 MG tablet Take 1 tablet (40 mg total) by mouth daily. 90 tablet 3    oxyCODONE  (ROXICODONE ) 5 MG immediate release tablet Take 1 tablet (5 mg total) by mouth every four (4) hours as needed for pain. 10 tablet 0    tacrolimus  (ENVARSUS  XR) 1 mg Tb24 extended release tablet Take 1 tablet (1 mg total) by mouth in the morning. Take in addition to 4 mg tablet for a total daily dose of 5 mg .SABRA 90 tablet 3     No current facility-administered medications for this visit.           Physical Exam  BP 152/78 (BP Site: R Arm, BP Position: Sitting, BP Cuff Size: Medium)  - Pulse 60  - Temp 36.6 ??C (97.8 ??F) (Temporal)  - Wt 73.2 kg (161 lb 6.4 oz)  - BMI 25.27 kg/m??   General: no acute distress  HEENT: mucous membranes moist  Neck: neck supple, no cervical lymphadenopathy appreciated  CV: normal rate, normal rhythm, no murmur, no gallops, no rubs appreciated  Lungs: clear to auscultation bilaterally  Abdomen: soft, non tender  Extremities:  no edema, LUE AVF ligation site healed  Musculoskeletal: no visible deformity, normal range of motion.  Pulses: intact distally throughout  Neurologic: awake, alert, and oriented x3      Laboratory Data and Imaging reviewed in EPIC      Assessment:    ICD-10-CM   1. Kidney transplant recipient (HHS-HCC)  Z94.0   2. Immunosuppressive management encounter following kidney transplant (HHS-HCC)  Z79.899    Z94.0   3. 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 No DSA. UPCR 0.258g, stable. Continue jardiance  25 mg daily.    Immunosuppression [High Risk Medical Decision Making For Drug Therapy Requiring Intensive Monitoring For Toxicity]:   Tacrolimus , Trough (ng/mL)   Date Value   01/07/2024 4.8 (L)   12/10/2023 4.7 (L)   11/06/2023 5.7   Increase envarsus  to 6 mg daily  tacrolimus  trough level goal of approximately 5-7 ng/mL. Continue mycophenolate  360 mg BID. WBC normal.     Other interventions:  -Low level CMV viremia: resolved, negative in May and June 2025, will hold on checking further levels unless symptoms develop  -Essential hypertension: BP above goal, increase amlodipine  to 10 mg daily  -Diabetes Mellitus type 2: Hgb A1c 6.2% on 01/07/24, continue metformin  500 mg daily. He could not afford empagliflozin           Counseling:  I counseled the patient on:  The need to avoid sun exposure and the use of sunblock while outdoors given the relatively higher risk of skin malignancy in an immunosuppressed state.  The need for adherence to immunosuppression medication.  Patient verbalized understanding.     Follow-Up:  Return in about 6 months (around 07/19/2024).     Patient will continue to follow-up with his primary care provider for non-transplant related issues and medication refills. We have ordered transplant specific labs per the center's guidelines to monitor and assess for toxicities from immunosuppressant drug therapy          Oneil Loader, DO  Transplant Nephrology  [facility] Division of Nephrology and Hypertension  01/18/2024  12:43 PM

## 2024-01-28 ENCOUNTER — Encounter: Admit: 2024-01-28 | Discharge: 2024-01-29 | Payer: Medicare (Managed Care) | Attending: Nephrology | Primary: Nephrology

## 2024-01-28 ENCOUNTER — Ambulatory Visit: Admit: 2024-01-28 | Discharge: 2024-01-29 | Payer: Medicare (Managed Care)

## 2024-01-28 LAB — CBC W/ AUTO DIFF
BASOPHILS ABSOLUTE COUNT: 0.1 10*9/L (ref 0.0–0.1)
BASOPHILS RELATIVE PERCENT: 1.3 %
EOSINOPHILS ABSOLUTE COUNT: 0.2 10*9/L (ref 0.0–0.5)
EOSINOPHILS RELATIVE PERCENT: 4.7 %
HEMATOCRIT: 38.5 % — ABNORMAL LOW (ref 39.0–48.0)
HEMOGLOBIN: 12.4 g/dL — ABNORMAL LOW (ref 12.9–16.5)
LYMPHOCYTES ABSOLUTE COUNT: 1 10*9/L — ABNORMAL LOW (ref 1.1–3.6)
LYMPHOCYTES RELATIVE PERCENT: 19.8 %
MEAN CORPUSCULAR HEMOGLOBIN CONC: 32.4 g/dL (ref 32.0–36.0)
MEAN CORPUSCULAR HEMOGLOBIN: 29.4 pg (ref 25.9–32.4)
MEAN CORPUSCULAR VOLUME: 90.7 fL (ref 77.6–95.7)
MEAN PLATELET VOLUME: 7.5 fL (ref 6.8–10.7)
MONOCYTES ABSOLUTE COUNT: 0.8 10*9/L (ref 0.3–0.8)
MONOCYTES RELATIVE PERCENT: 15.4 %
NEUTROPHILS ABSOLUTE COUNT: 3 10*9/L (ref 1.8–7.8)
NEUTROPHILS RELATIVE PERCENT: 58.8 %
NUCLEATED RED BLOOD CELLS: 0 /100{WBCs} (ref <=4)
PLATELET COUNT: 218 10*9/L (ref 150–450)
RED BLOOD CELL COUNT: 4.24 10*12/L — ABNORMAL LOW (ref 4.26–5.60)
RED CELL DISTRIBUTION WIDTH: 15.5 % — ABNORMAL HIGH (ref 12.2–15.2)
WBC ADJUSTED: 5.1 10*9/L (ref 3.6–11.2)

## 2024-01-28 LAB — RENAL FUNCTION PANEL
ALBUMIN: 4.2 g/dL (ref 3.4–5.0)
ANION GAP: 14 mmol/L (ref 5–14)
BLOOD UREA NITROGEN: 35 mg/dL — ABNORMAL HIGH (ref 9–23)
BUN / CREAT RATIO: 20
CALCIUM: 10.5 mg/dL — ABNORMAL HIGH (ref 8.7–10.4)
CHLORIDE: 110 mmol/L — ABNORMAL HIGH (ref 98–107)
CO2: 21.8 mmol/L (ref 20.0–31.0)
CREATININE: 1.79 mg/dL — ABNORMAL HIGH (ref 0.73–1.18)
EGFR CKD-EPI (2021) MALE: 40 mL/min/{1.73_m2} — ABNORMAL LOW (ref >=60)
GLUCOSE RANDOM: 114 mg/dL (ref 70–179)
PHOSPHORUS: 3.4 mg/dL (ref 2.4–5.1)
POTASSIUM: 5 mmol/L — ABNORMAL HIGH (ref 3.4–4.8)
SODIUM: 146 mmol/L — ABNORMAL HIGH (ref 135–145)

## 2024-01-28 LAB — TACROLIMUS LEVEL, TROUGH: TACROLIMUS, TROUGH: 6 ng/mL (ref 5.0–15.0)

## 2024-01-28 LAB — MAGNESIUM: MAGNESIUM: 1.7 mg/dL (ref 1.6–2.6)

## 2024-01-28 LAB — CMV DNA, QUANTITATIVE, PCR
CMV QUANT: <35 [IU]/mL — ABNORMAL HIGH (ref <0)
CMV VIRAL LD: DETECTED — AB

## 2024-01-28 NOTE — Unmapped (Signed)
 [facility] Specialty and Home Delivery Pharmacy Refill Coordination Note    Specialty Medication(s) to be Shipped:   Transplant: Myfortic  180mg     Other medication(s) to be shipped: atorvastatin  40, amlodipine  [address], DOB: [birth year]  Phone: [phone] (home)       All above HIPAA information was verified with patient.     Was a Nurse, learning disability used for this call? No    Completed refill call assessment today to schedule patient's medication shipment from the [facility] and Home Delivery Pharmacy  [phone]).  All relevant notes have been reviewed.     Specialty medication(s) and dose(s) confirmed: Regimen is correct and unchanged.   Changes to medications: Shae reports no changes at this time.  Changes to insurance: No  New side effects reported not previously addressed with a pharmacist or physician: None reported  Questions for the pharmacist: No    Confirmed patient received a Conservation officer, historic buildings and a Surveyor, mining with first shipment. The patient will receive a drug information handout for each medication shipped and additional FDA Medication Guides as required.       DISEASE/MEDICATION-SPECIFIC INFORMATION        N/A    SPECIALTY MEDICATION ADHERENCE     Medication Adherence    Patient reported X missed doses in the last month: 0  Specialty Medication: mycophenolate  (MYFORTIC ) 180 MG EC tablet  Patient is on additional specialty medications: No  Informant: patient              Were doses missed due to medication being on hold? No    mycophenolate  180 MG EC tablet (MYFORTIC )  : 5 days of medicine on hand       REFERRAL TO PHARMACIST     Referral to the pharmacist: Not needed      [facility]     Shipping address confirmed in Epic.     Cost and Payment: Patient has a copay of $8.71. They are aware and have authorized the pharmacy to charge the credit card on file.    Delivery Scheduled: Yes, Expected medication delivery date: 01/31/24.     Medication will be delivered via Next Day Courier to the prescription address in Epic WAM.    Jeoffrey JAYSON Sherra [facility] and [facility]

## 2024-01-30 MED FILL — MYCOPHENOLATE SODIUM 180 MG TABLET,DELAYED RELEASE: ORAL | 45 days supply | Qty: 180 | Fill #3

## 2024-01-30 MED FILL — ATORVASTATIN 40 MG TABLET: ORAL | 90 days supply | Qty: 90 | Fill #2

## 2024-02-21 DIAGNOSIS — Z940 Kidney transplant status: Principal | ICD-10-CM

## 2024-02-22 NOTE — Unmapped (Signed)
 Frank Leach has been contacted in regards to their refill of tacrolimus : ENVARSUS  XR 1 mg Tb24 extended release tablet and . At this time, they have declined refill due to patient recently filling else where and has plenty on hand . Refill assessment call date has been updated per the patient's request.

## 2024-03-06 MED FILL — ALLOPURINOL 100 MG TABLET: ORAL | 90 days supply | Qty: 90 | Fill #3

## 2024-03-06 MED FILL — MG-PLUS-PROTEIN 133 MG TABLET: ORAL | 50 days supply | Qty: 100 | Fill #5

## 2024-03-10 ENCOUNTER — Ambulatory Visit: Admit: 2024-03-10 | Discharge: 2024-03-11 | Payer: Medicare (Managed Care)

## 2024-03-10 ENCOUNTER — Encounter: Admit: 2024-03-10 | Discharge: 2024-03-11 | Payer: Medicare (Managed Care) | Attending: Nephrology | Primary: Nephrology

## 2024-03-10 LAB — CBC W/ AUTO DIFF
BASOPHILS ABSOLUTE COUNT: 0.1 10*9/L (ref 0.0–0.1)
BASOPHILS RELATIVE PERCENT: 1.3 %
EOSINOPHILS ABSOLUTE COUNT: 0.2 10*9/L (ref 0.0–0.5)
EOSINOPHILS RELATIVE PERCENT: 3.8 %
HEMATOCRIT: 38.3 % — ABNORMAL LOW (ref 39.0–48.0)
HEMOGLOBIN: 12.4 g/dL — ABNORMAL LOW (ref 12.9–16.5)
LYMPHOCYTES ABSOLUTE COUNT: 1.1 10*9/L (ref 1.1–3.6)
LYMPHOCYTES RELATIVE PERCENT: 18.7 %
MEAN CORPUSCULAR HEMOGLOBIN CONC: 32.4 g/dL (ref 32.0–36.0)
MEAN CORPUSCULAR HEMOGLOBIN: 29.3 pg (ref 25.9–32.4)
MEAN CORPUSCULAR VOLUME: 90.2 fL (ref 77.6–95.7)
MEAN PLATELET VOLUME: 7.8 fL (ref 6.8–10.7)
MONOCYTES ABSOLUTE COUNT: 0.9 10*9/L — ABNORMAL HIGH (ref 0.3–0.8)
MONOCYTES RELATIVE PERCENT: 16 %
NEUTROPHILS ABSOLUTE COUNT: 3.5 10*9/L (ref 1.8–7.8)
NEUTROPHILS RELATIVE PERCENT: 60.2 %
NUCLEATED RED BLOOD CELLS: 0 /100{WBCs} (ref <=4)
PLATELET COUNT: 225 10*9/L (ref 150–450)
RED BLOOD CELL COUNT: 4.25 10*12/L — ABNORMAL LOW (ref 4.26–5.60)
RED CELL DISTRIBUTION WIDTH: 15.5 % — ABNORMAL HIGH (ref 12.2–15.2)
WBC ADJUSTED: 5.8 10*9/L (ref 3.6–11.2)

## 2024-03-10 LAB — MAGNESIUM: MAGNESIUM: 1.6 mg/dL (ref 1.6–2.6)

## 2024-03-10 LAB — BASIC METABOLIC PANEL
ANION GAP: 11 mmol/L (ref 5–14)
BLOOD UREA NITROGEN: 29 mg/dL — ABNORMAL HIGH (ref 9–23)
BUN / CREAT RATIO: 18
CALCIUM: 10.3 mg/dL (ref 8.7–10.4)
CHLORIDE: 109 mmol/L — ABNORMAL HIGH (ref 98–107)
CO2: 24.2 mmol/L (ref 20.0–31.0)
CREATININE: 1.62 mg/dL — ABNORMAL HIGH (ref 0.73–1.18)
EGFR CKD-EPI (2021) MALE: 45 mL/min/1.73m2 — ABNORMAL LOW (ref >=60)
GLUCOSE RANDOM: 122 mg/dL (ref 70–179)
POTASSIUM: 5.1 mmol/L — ABNORMAL HIGH (ref 3.4–4.8)
SODIUM: 144 mmol/L (ref 135–145)

## 2024-03-10 LAB — PHOSPHORUS: PHOSPHORUS: 2.8 mg/dL (ref 2.4–5.1)

## 2024-03-10 LAB — TACROLIMUS LEVEL, TROUGH: TACROLIMUS, TROUGH: 7.1 ng/mL (ref 5.0–15.0)

## 2024-03-11 DIAGNOSIS — Z940 Kidney transplant status: Principal | ICD-10-CM

## 2024-03-11 DIAGNOSIS — Z79899 Other long term (current) drug therapy: Principal | ICD-10-CM

## 2024-03-11 LAB — CMV DNA, QUANTITATIVE, PCR
CMV QUANT: <35 [IU]/mL — ABNORMAL HIGH (ref <0)
CMV VIRAL LD: DETECTED — AB

## 2024-03-11 MED ORDER — MYCOPHENOLATE SODIUM 180 MG TABLET,DELAYED RELEASE
ORAL_TABLET | Freq: Two times a day (BID) | ORAL | 3 refills | 45.00000 days | Status: CP
Start: 2024-03-11 — End: ?
  Filled 2024-03-14: qty 180, 45d supply, fill #0

## 2024-03-11 NOTE — Unmapped (Signed)
 [facility] Specialty and Home Delivery Pharmacy Refill Coordination Note    Specialty Medication(s) to be Shipped:   Transplant: mycophenolate  mofetil 180 mg    Other medication(s) to be shipped: olmesartan  40 mg      [employer], DOB: [birth year]  Phone: [phone] (home)       All above HIPAA information was verified with patient.     Was a Nurse, learning disability used for this call? No    Completed refill call assessment today to schedule patient's medication shipment from the [facility] and Home Delivery Pharmacy  [phone]).  All relevant notes have been reviewed.     Specialty medication(s) and dose(s) confirmed: Regimen is correct and unchanged.   Changes to medications: Renan reports no changes at this time.  Changes to insurance: No  New side effects reported not previously addressed with a pharmacist or physician: None reported  Questions for the pharmacist: No    Confirmed patient received a Conservation officer, historic buildings and a Surveyor, mining with first shipment. The patient will receive a drug information handout for each medication shipped and additional FDA Medication Guides as required.       DISEASE/MEDICATION-SPECIFIC INFORMATION        N/A    SPECIALTY MEDICATION ADHERENCE     Medication Adherence    Patient reported X missed doses in the last month: 0  Specialty Medication: mycophenolate  180 MG EC tablet (MYFORTIC )  Patient is on additional specialty medications: No              Were doses missed due to medication being on hold? No    mycophenolate  180 MG EC tablet (MYFORTIC )  10 days of medicine on hand       REFERRAL TO PHARMACIST     Referral to the pharmacist: Not needed      [facility]     Shipping address confirmed in Epic.     Cost and Payment: Patient has a copay of $7.01 . They are aware and have authorized the pharmacy to charge the credit card on file.    Delivery Scheduled: Yes, Expected medication delivery date: 03/14/2024.     Medication will be delivered via Same Day Courier to the prescription address in Epic WAM.    Nelida Winfred [facility]  Specialty Technician

## 2024-03-14 MED FILL — OLMESARTAN 40 MG TABLET: ORAL | 90 days supply | Qty: 90 | Fill #2

## 2024-04-08 ENCOUNTER — Ambulatory Visit: Admit: 2024-04-08 | Discharge: 2024-04-09 | Payer: Medicare (Managed Care)

## 2024-04-08 LAB — BASIC METABOLIC PANEL
ANION GAP: 13 mmol/L (ref 5–14)
BLOOD UREA NITROGEN: 29 mg/dL — ABNORMAL HIGH (ref 9–23)
BUN / CREAT RATIO: 18
CALCIUM: 10.5 mg/dL — ABNORMAL HIGH (ref 8.7–10.4)
CHLORIDE: 109 mmol/L — ABNORMAL HIGH (ref 98–107)
CO2: 22.4 mmol/L (ref 20.0–31.0)
CREATININE: 1.65 mg/dL — ABNORMAL HIGH (ref 0.73–1.18)
EGFR CKD-EPI (2021) MALE: 44 mL/min/1.73m2 — ABNORMAL LOW (ref >=60)
GLUCOSE RANDOM: 106 mg/dL (ref 70–179)
POTASSIUM: 5.2 mmol/L — ABNORMAL HIGH (ref 3.4–4.8)
SODIUM: 144 mmol/L (ref 135–145)

## 2024-04-08 LAB — CBC W/ AUTO DIFF
BASOPHILS ABSOLUTE COUNT: 0.1 10*9/L (ref 0.0–0.1)
BASOPHILS RELATIVE PERCENT: 2.4 %
EOSINOPHILS ABSOLUTE COUNT: 0.3 10*9/L (ref 0.0–0.5)
EOSINOPHILS RELATIVE PERCENT: 6.1 %
HEMATOCRIT: 37.6 % — ABNORMAL LOW (ref 39.0–48.0)
HEMOGLOBIN: 12.2 g/dL — ABNORMAL LOW (ref 12.9–16.5)
LYMPHOCYTES ABSOLUTE COUNT: 0.9 10*9/L — ABNORMAL LOW (ref 1.1–3.6)
LYMPHOCYTES RELATIVE PERCENT: 18.9 %
MEAN CORPUSCULAR HEMOGLOBIN CONC: 32.4 g/dL (ref 32.0–36.0)
MEAN CORPUSCULAR HEMOGLOBIN: 29.3 pg (ref 25.9–32.4)
MEAN CORPUSCULAR VOLUME: 90.6 fL (ref 77.6–95.7)
MEAN PLATELET VOLUME: 7.8 fL (ref 6.8–10.7)
MONOCYTES ABSOLUTE COUNT: 0.8 10*9/L (ref 0.3–0.8)
MONOCYTES RELATIVE PERCENT: 15.9 %
NEUTROPHILS ABSOLUTE COUNT: 2.7 10*9/L (ref 1.8–7.8)
NEUTROPHILS RELATIVE PERCENT: 56.7 %
NUCLEATED RED BLOOD CELLS: 0 /100{WBCs} (ref <=4)
PLATELET COUNT: 231 10*9/L (ref 150–450)
RED BLOOD CELL COUNT: 4.15 10*12/L — ABNORMAL LOW (ref 4.26–5.60)
RED CELL DISTRIBUTION WIDTH: 15.5 % — ABNORMAL HIGH (ref 12.2–15.2)
WBC ADJUSTED: 4.8 10*9/L (ref 3.6–11.2)

## 2024-04-08 LAB — CMV DNA, QUANTITATIVE, PCR
CMV QUANT: <35 [IU]/mL — ABNORMAL HIGH (ref <0)
CMV VIRAL LD: DETECTED — AB

## 2024-04-08 LAB — MAGNESIUM: MAGNESIUM: 1.6 mg/dL (ref 1.6–2.6)

## 2024-04-08 LAB — TACROLIMUS LEVEL, TROUGH: TACROLIMUS, TROUGH: 4.8 ng/mL — ABNORMAL LOW (ref 5.0–15.0)

## 2024-04-08 LAB — PHOSPHORUS: PHOSPHORUS: 3.2 mg/dL (ref 2.4–5.1)

## 2024-04-15 MED FILL — ATORVASTATIN 40 MG TABLET: ORAL | 90 days supply | Qty: 90 | Fill #3

## 2024-04-15 MED FILL — AMLODIPINE 10 MG TABLET: ORAL | 90 days supply | Qty: 90 | Fill #1

## 2024-04-15 MED FILL — MG-PLUS-PROTEIN 133 MG TABLET: ORAL | 50 days supply | Qty: 100 | Fill #6

## 2024-04-16 ENCOUNTER — Inpatient Hospital Stay: Admit: 2024-04-16 | Discharge: 2024-04-16 | Payer: Medicare (Managed Care)

## 2024-04-18 NOTE — Unmapped (Signed)
 Frank Leach has been contacted in regards to their refill of mycophenolate  180 MG EC tablet (MYFORTIC ). At this time, they have declined refill due to having more than 2 weeks on hand. Refill assessment call date has been updated per the patient's request.

## 2024-05-07 ENCOUNTER — Ambulatory Visit: Admit: 2024-05-07 | Discharge: 2024-05-08 | Payer: Medicare (Managed Care)

## 2024-05-07 DIAGNOSIS — E0801 Diabetes mellitus due to underlying condition with hyperosmolarity with coma: Principal | ICD-10-CM

## 2024-05-07 DIAGNOSIS — Z794 Long term (current) use of insulin: Principal | ICD-10-CM

## 2024-05-07 DIAGNOSIS — Z940 Kidney transplant status: Principal | ICD-10-CM

## 2024-05-07 LAB — CBC W/ AUTO DIFF
BASOPHILS ABSOLUTE COUNT: 0.1 10*9/L (ref 0.0–0.1)
BASOPHILS RELATIVE PERCENT: 1.5 %
EOSINOPHILS ABSOLUTE COUNT: 0.2 10*9/L (ref 0.0–0.5)
EOSINOPHILS RELATIVE PERCENT: 4.8 %
HEMATOCRIT: 39.7 % (ref 39.0–48.0)
HEMOGLOBIN: 13 g/dL (ref 12.9–16.5)
LYMPHOCYTES ABSOLUTE COUNT: 1 10*9/L — ABNORMAL LOW (ref 1.1–3.6)
LYMPHOCYTES RELATIVE PERCENT: 19.7 %
MEAN CORPUSCULAR HEMOGLOBIN CONC: 32.8 g/dL (ref 32.0–36.0)
MEAN CORPUSCULAR HEMOGLOBIN: 29.4 pg (ref 25.9–32.4)
MEAN CORPUSCULAR VOLUME: 89.6 fL (ref 77.6–95.7)
MEAN PLATELET VOLUME: 7.7 fL (ref 6.8–10.7)
MONOCYTES ABSOLUTE COUNT: 0.6 10*9/L (ref 0.3–0.8)
MONOCYTES RELATIVE PERCENT: 11.9 %
NEUTROPHILS ABSOLUTE COUNT: 3.2 10*9/L (ref 1.8–7.8)
NEUTROPHILS RELATIVE PERCENT: 62.1 %
NUCLEATED RED BLOOD CELLS: 0 /100{WBCs} (ref <=4)
PLATELET COUNT: 225 10*9/L (ref 150–450)
RED BLOOD CELL COUNT: 4.44 10*12/L (ref 4.26–5.60)
RED CELL DISTRIBUTION WIDTH: 15.1 % (ref 12.2–15.2)
WBC ADJUSTED: 5.1 10*9/L (ref 3.6–11.2)

## 2024-05-07 LAB — TACROLIMUS LEVEL, TROUGH: TACROLIMUS, TROUGH: 4.9 ng/mL — ABNORMAL LOW (ref 5.0–15.0)

## 2024-05-07 LAB — BASIC METABOLIC PANEL
ANION GAP: 13 mmol/L (ref 5–14)
BLOOD UREA NITROGEN: 22 mg/dL (ref 9–23)
BUN / CREAT RATIO: 14
CALCIUM: 10.6 mg/dL — ABNORMAL HIGH (ref 8.7–10.4)
CHLORIDE: 108 mmol/L — ABNORMAL HIGH (ref 98–107)
CO2: 23.8 mmol/L (ref 20.0–31.0)
CREATININE: 1.57 mg/dL — ABNORMAL HIGH (ref 0.73–1.18)
EGFR CKD-EPI (2021) MALE: 47 mL/min/1.73m2 — ABNORMAL LOW (ref >=60)
GLUCOSE RANDOM: 115 mg/dL (ref 70–179)
POTASSIUM: 5 mmol/L — ABNORMAL HIGH (ref 3.4–4.8)
SODIUM: 145 mmol/L (ref 135–145)

## 2024-05-07 LAB — MAGNESIUM: MAGNESIUM: 1.5 mg/dL — ABNORMAL LOW (ref 1.6–2.6)

## 2024-05-07 LAB — PHOSPHORUS: PHOSPHORUS: 2.6 mg/dL (ref 2.4–5.1)

## 2024-05-07 LAB — CMV DNA, QUANTITATIVE, PCR: CMV VIRAL LD: NOT DETECTED

## 2024-05-07 MED ORDER — TACROLIMUS XR 1 MG TABLET,EXTENDED RELEASE 24 HR
ORAL_TABLET | Freq: Every day | ORAL | 3 refills | 90.00000 days | Status: CP
Start: 2024-05-07 — End: 2025-05-07

## 2024-05-07 MED ORDER — METFORMIN ER 500 MG TABLET,EXTENDED RELEASE 24 HR
ORAL_TABLET | Freq: Every day | ORAL | 3 refills | 90.00000 days | Status: CP
Start: 2024-05-07 — End: ?
  Filled 2024-05-12: qty 90, 90d supply, fill #0

## 2024-05-07 NOTE — Unmapped (Signed)
 10/1: pt verified envarsus  comes from mfg, not [facility]. Message sent to clinic team to re-route rx to mfg/discontinue SH[facility] rx to avoid confusion per mgmt request -ef  10/1: pt verified no longer on jardiance , message sent to clinic team to dc rx per patient request -[facility] Specialty and Home Delivery Pharmacy Clinical Assessment & Refill Coordination Note    Frank Leach, DOB: [birth year]  Phone: [phone] (home)     All above HIPAA information was verified with patient.     Was a Nurse, learning disability used for this call? No    Specialty Medication(s):   Transplant: mycophenolic acid 180mg      Current Medications[1]     Changes to medications: Frank Leach reports no changes at this time.    Medication list has been reviewed and updated in Epic: yes  No high priority alerts noted on med rec today    Allergies[2]    Changes to allergies: No    Allergies have been reviewed and updated in Epic: Yes    SPECIALTY MEDICATION ADHERENCE       Mycophenolate  180mg   : 7 days of medicine on hand       Medication Adherence    Patient reported X missed doses in the last month: 0  Specialty Medication: mycophenolate  180mg   Patient is on additional specialty medications: No          Specialty medication(s) dose(s) confirmed: Regimen is correct and unchanged.     Are there any concerns with adherence? No    Adherence counseling provided? Not needed    CLINICAL MANAGEMENT AND INTERVENTION      Clinical Benefit Assessment:    Do you feel the medicine is effective or helping your condition? Yes    Clinical Benefit counseling provided? Not needed    Adverse Effects Assessment:    Are you experiencing any side effects? No    Are you experiencing difficulty administering your medicine? No    Quality of Life Assessment:    Quality of Life    Rheumatology  Oncology  Dermatology  Cystic Fibrosis          How many days over the past month did your transplant  keep you from your normal activities? For example, brushing your teeth or getting up in the morning. 0    Have you discussed this with your provider? Not needed    Acute Infection Status:    Acute infections noted within Epic:  No active infections    Patient reported infection: None    Therapy Appropriateness:    Is the medication and dose appropriate considering the patient???s diagnosis, treatment, and disease journey, comorbidities, medical history, current medications, allergies, therapeutic goals, self-administration ability, and access barriers? Yes, therapy is appropriate and should be continued     Clinical Intervention:    Was an intervention completed as part of this clinical assessment? No    DISEASE/MEDICATION-SPECIFIC INFORMATION      N/A    Solid Organ Transplant: Not Applicable    PATIENT SPECIFIC NEEDS     Does the patient have any physical, cognitive, or cultural barriers? No    Is the patient high risk? Yes, patient is taking a REMS drug. Medication is dispensed in compliance with REMS program    Does the patient require physician intervention or other additional services (i.e., nutrition, smoking cessation, social work)? No    Does the patient have an additional or emergency contact listed in their chart? Yes  SOCIAL DETERMINANTS OF HEALTH     At the [facility], we have learned that life circumstances - like trouble affording food, housing, utilities, or transportation can affect the health of many of our patients.   That is why we wanted to ask: are you currently experiencing any life circumstances that are negatively impacting your health and/or quality of life? Patient declined to answer    Social Drivers of Health     Food Insecurity: No Food Insecurity (06/23/2019)    Hunger Vital Sign     Worried About Running Out of Food in the Last Year: Never true     Ran Out of Food in the Last Year: Never true   Tobacco Use: Low Risk  (01/18/2024)    Patient History     Smoking Tobacco Use: Never     Smokeless Tobacco Use: Never     Passive Exposure: Not on file   Transportation Needs: Not on file   Alcohol Use: Not on file   Housing: Unknown (11/13/2020)    Housing     Within the past 12 months, have you ever stayed: outside, in a car, in a tent, in an overnight shelter, or temporarily in someone else's home (i.e. couch-surfing)?: No     Are you worried about losing your housing?: Not on file   Physical Activity: Not on file   Utilities: Not on file   Stress: Not on file   Interpersonal Safety: Not on file   Substance Use: Not on file (06/11/2023)   Intimate Partner Violence: Not on file   Social Connections: Not on file   Financial Resource Strain: Not on file   Health Literacy: Low Risk  (11/13/2020)    Health Literacy     : Never   Internet Connectivity: Not on file       Would you be willing to receive help with any of the needs that you have identified today? Not applicable       SHIPPING     Specialty Medication(s) to be Shipped:   Transplant: mycophenolic acid 180mg     Other medication(s) to be shipped: metformin     Specialty Medications not needed at this time: N/A     Changes to insurance: No    Cost and Payment: Patient has a copay of $7.01 mycophenolate , unable to test metformin  as waiting on new rx but notes added pt says should be $0 like last time. They are aware and have authorized the pharmacy to charge the credit card on file.    Delivery Scheduled: Yes, Expected medication delivery date: 10/6/20258.  However, Rx request for refills was sent to the provider as there are none remaining.     Medication will be delivered via Same Day Courier to the confirmed prescription address in [facility].    The patient will receive a drug information handout for each medication shipped and additional FDA Medication Guides as required.  Verified that patient has previously received a Conservation officer, historic buildings and a Surveyor, mining.    The patient or caregiver noted above participated in the development of this care plan and knows that they can request review of or adjustments to the care plan at any time.      All of the patient's questions and concerns have been addressed.    Ronal FORBES Reusing, PharmD   [facility] Specialty and Home Delivery Pharmacy Specialty Pharmacist         [1]   Current Outpatient Medications  Medication Sig Dispense Refill    acetaminophen  (TYLENOL ) 500 MG tablet Take 1-2 tablets (500-1,000 mg total) by mouth every six (6) hours as needed for pain or fever (> 38C). 100 tablet 0    allopurinol  (ZYLOPRIM ) 100 MG tablet Take 1 tablet (100 mg total) by mouth every evening. 90 tablet 3    amlodipine  (NORVASC ) 10 MG tablet Take 1 tablet (10 mg total) by mouth daily. 90 tablet 3    atorvastatin  (LIPITOR ) 40 MG tablet Take 1 tablet (40 mg total) by mouth daily. 90 tablet 3    empagliflozin  (JARDIANCE ) 25 mg tablet Take 1 tablet (25 mg total) by mouth daily. (Patient not taking: Reported on 05/07/2024) 30 tablet 11    ENVARSUS  XR 4 mg Tb24 extended release tablet Take 1 tablet (4 mg total) by mouth daily. Take in addition to TWO 1 mg tablets for a total daily dose of 6 mg. 60 tablet 11    magnesium oxide-Mg AA chelate (MAGNESIUM, AMINO ACID CHELATE,) 133 mg Take 1 tablet by mouth two (2) times a day. 100 tablet 11    metFORMIN  (GLUCOPHAGE -XR) 500 MG 24 hr tablet Take 1 tablet (500 mg total) by mouth in the morning. 180 tablet 3    mycophenolate  (MYFORTIC ) 180 MG EC tablet Take 2 tablets (360 mg total) by mouth two (2) times a day. 180 tablet 3    olmesartan  (BENICAR ) 40 MG tablet Take 1 tablet (40 mg total) by mouth daily. 90 tablet 3    oxyCODONE  (ROXICODONE ) 5 MG immediate release tablet Take 1 tablet (5 mg total) by mouth every four (4) hours as needed for pain. 10 tablet 0    tacrolimus  (ENVARSUS  XR) 1 mg Tb24 extended release tablet Take 2 tablets (2 mg total) by mouth in the morning. Take in addition to 4 mg tablet for a total daily dose of 6 mg . 180 tablet 3     No current facility-administered medications for this visit.   [2] No Known Allergies

## 2024-05-12 MED FILL — MYCOPHENOLATE SODIUM 180 MG TABLET,DELAYED RELEASE: ORAL | 45 days supply | Qty: 180 | Fill #1

## 2024-05-29 NOTE — Unmapped (Signed)
 I spoke with the patient (or family member) to notify them that their insurance will no longer be in-network as of 08/07/2024. I informed them of their option to switch to Original Medicare or a different Medicare Advantage plan that includes [facility] during the annual enrollment period. I provided them with contact information for Chapter who can assist them with finding another plan that keeps [facility] in network.    Patient aware and has already switched

## 2024-06-09 ENCOUNTER — Ambulatory Visit: Admit: 2024-06-09 | Discharge: 2024-06-10 | Payer: Medicare (Managed Care)

## 2024-06-09 LAB — CBC W/ AUTO DIFF
BASOPHILS ABSOLUTE COUNT: 0.1 10*9/L (ref 0.0–0.1)
BASOPHILS RELATIVE PERCENT: 1.1 %
EOSINOPHILS ABSOLUTE COUNT: 0.3 10*9/L (ref 0.0–0.5)
EOSINOPHILS RELATIVE PERCENT: 5.2 %
HEMATOCRIT: 38.7 % — ABNORMAL LOW (ref 39.0–48.0)
HEMOGLOBIN: 12.7 g/dL — ABNORMAL LOW (ref 12.9–16.5)
LYMPHOCYTES ABSOLUTE COUNT: 1.1 10*9/L (ref 1.1–3.6)
LYMPHOCYTES RELATIVE PERCENT: 20 %
MEAN CORPUSCULAR HEMOGLOBIN CONC: 32.7 g/dL (ref 32.0–36.0)
MEAN CORPUSCULAR HEMOGLOBIN: 29.4 pg (ref 25.9–32.4)
MEAN CORPUSCULAR VOLUME: 90 fL (ref 77.6–95.7)
MEAN PLATELET VOLUME: 8.2 fL (ref 6.8–10.7)
MONOCYTES ABSOLUTE COUNT: 0.8 10*9/L (ref 0.3–0.8)
MONOCYTES RELATIVE PERCENT: 14.7 %
NEUTROPHILS ABSOLUTE COUNT: 3.1 10*9/L (ref 1.8–7.8)
NEUTROPHILS RELATIVE PERCENT: 59 %
NUCLEATED RED BLOOD CELLS: 0 /100{WBCs} (ref <=4)
PLATELET COUNT: 220 10*9/L (ref 150–450)
RED BLOOD CELL COUNT: 4.3 10*12/L (ref 4.26–5.60)
RED CELL DISTRIBUTION WIDTH: 15.2 % (ref 12.2–15.2)
WBC ADJUSTED: 5.3 10*9/L (ref 3.6–11.2)

## 2024-06-09 LAB — BASIC METABOLIC PANEL
ANION GAP: 12 mmol/L (ref 5–14)
BLOOD UREA NITROGEN: 22 mg/dL (ref 9–23)
BUN / CREAT RATIO: 14
CALCIUM: 10 mg/dL (ref 8.7–10.4)
CHLORIDE: 109 mmol/L — ABNORMAL HIGH (ref 98–107)
CO2: 22.1 mmol/L (ref 20.0–31.0)
CREATININE: 1.62 mg/dL — ABNORMAL HIGH (ref 0.73–1.18)
EGFR CKD-EPI (2021) MALE: 45 mL/min/1.73m2 — ABNORMAL LOW (ref >=60)
GLUCOSE RANDOM: 122 mg/dL (ref 70–179)
POTASSIUM: 5 mmol/L — ABNORMAL HIGH (ref 3.4–4.8)
SODIUM: 143 mmol/L (ref 135–145)

## 2024-06-09 LAB — TACROLIMUS LEVEL, TROUGH: TACROLIMUS, TROUGH: 5.3 ng/mL (ref 5.0–15.0)

## 2024-06-09 LAB — PHOSPHORUS: PHOSPHORUS: 2.7 mg/dL (ref 2.4–5.1)

## 2024-06-09 LAB — MAGNESIUM: MAGNESIUM: 1.5 mg/dL — ABNORMAL LOW (ref 1.6–2.6)

## 2024-06-10 LAB — CMV DNA, QUANTITATIVE, PCR: CMV VIRAL LD: NOT DETECTED

## 2024-06-11 DIAGNOSIS — M109 Gout, unspecified: Principal | ICD-10-CM

## 2024-06-11 DIAGNOSIS — Z940 Kidney transplant status: Principal | ICD-10-CM

## 2024-06-11 MED ORDER — MG-PLUS-PROTEIN 133 MG TABLET
ORAL_TABLET | Freq: Two times a day (BID) | ORAL | 11 refills | 50.00000 days | Status: CP
Start: 2024-06-11 — End: ?
  Filled 2024-06-16: qty 100, 50d supply, fill #0

## 2024-06-11 MED ORDER — ALLOPURINOL 100 MG TABLET
ORAL_TABLET | Freq: Every evening | ORAL | 3 refills | 90.00000 days | Status: CP
Start: 2024-06-11 — End: ?
  Filled 2024-06-16: qty 90, 90d supply, fill #0

## 2024-06-11 NOTE — Progress Notes (Signed)
 [facility] Specialty and Home Delivery Pharmacy Refill Coordination Note    Specialty Medication(s) to be Shipped:   Transplant: mycophenolate  mofetil 180mg     Other medication(s) to be shipped: allopurinol , magnesium, olmesartan     Specialty Medications not needed at this time: N/A     Frank Leach, DOB: September 15, 1950  Phone: [phone] (home)       All above HIPAA information was verified with patient.     Was a nurse, learning disability used for this call? No    Completed refill call assessment today to schedule patient's medication shipment from the [facility] and Home Delivery Pharmacy  [phone]).  All relevant notes have been reviewed.     Specialty medication(s) and dose(s) confirmed: Regimen is correct and unchanged.   Changes to medications: Janthony reports no changes at this time.  Changes to insurance: No  New side effects reported not previously addressed with a pharmacist or physician: None reported  Questions for the pharmacist: No    Confirmed patient received a Conservation Officer, Historic Buildings and a Surveyor, Mining with first shipment. The patient will receive a drug information handout for each medication shipped and additional FDA Medication Guides as required.       DISEASE/MEDICATION-SPECIFIC INFORMATION        N/A    SPECIALTY MEDICATION ADHERENCE     Medication Adherence    Patient reported X missed doses in the last month: 0  Specialty Medication: mycophenolate  180 MG EC tablet (MYFORTIC )  Patient is on additional specialty medications: No  Patient is on more than two specialty medications: No  Any gaps in refill history greater than 2 weeks in the last 3 months: no  Demonstrates understanding of importance of adherence: yes  Informant: patient  Confirmed plan for next specialty medication refill: delivery by pharmacy  Refills needed for supportive medications: not needed          Refill Coordination    Has the Patients' Contact Information Changed: No  Is the Shipping Address Different: No         Were doses missed due to medication being on hold? No    mycophenolate  180   mg: 7 days of medicine on hand     REFERRAL TO PHARMACIST     Referral to the pharmacist: Not needed      [facility]     Shipping address confirmed in Epic.     Cost and Payment: Patient has a copay of $19.48. They are aware and have authorized the pharmacy to charge the credit card on file.    Delivery Scheduled: Yes, Expected medication delivery date: 06/17/24.     Medication will be delivered via Next Day Courier to the prescription address in Epic WAM.    Suzen Blood   [facility] Specialty and Home Delivery Pharmacy  Specialty Technician

## 2024-06-16 MED FILL — OLMESARTAN 40 MG TABLET: ORAL | 90 days supply | Qty: 90 | Fill #3

## 2024-06-16 MED FILL — MYCOPHENOLATE SODIUM 180 MG TABLET,DELAYED RELEASE: ORAL | 90 days supply | Qty: 360 | Fill #2

## 2024-07-03 LAB — COMPREHENSIVE METABOLIC PANEL
ALBUMIN: 4.1 g/dL (ref 3.4–5.0)
ALKALINE PHOSPHATASE: 78 U/L (ref 46–116)
ALT (SGPT): 17 U/L (ref 10–49)
ANION GAP: 13 mmol/L (ref 5–14)
AST (SGOT): 20 U/L (ref <=34)
BILIRUBIN TOTAL: 0.2 mg/dL — ABNORMAL LOW (ref 0.3–1.2)
BLOOD UREA NITROGEN: 28 mg/dL — ABNORMAL HIGH (ref 9–23)
BUN / CREAT RATIO: 16
CALCIUM: 10.8 mg/dL — ABNORMAL HIGH (ref 8.7–10.4)
CHLORIDE: 110 mmol/L — ABNORMAL HIGH (ref 98–107)
CO2: 21.8 mmol/L (ref 20.0–31.0)
CREATININE: 1.79 mg/dL — ABNORMAL HIGH (ref 0.73–1.18)
EGFR CKD-EPI (2021) MALE: 40 mL/min/1.73m2 — ABNORMAL LOW (ref >=60)
GLUCOSE RANDOM: 141 mg/dL (ref 70–179)
POTASSIUM: 5.1 mmol/L — ABNORMAL HIGH (ref 3.4–4.8)
PROTEIN TOTAL: 7.2 g/dL (ref 5.7–8.2)
SODIUM: 145 mmol/L (ref 135–145)

## 2024-07-03 LAB — CBC W/ AUTO DIFF
BASOPHILS ABSOLUTE COUNT: 0.1 10*9/L (ref 0.0–0.1)
BASOPHILS RELATIVE PERCENT: 1.3 %
EOSINOPHILS ABSOLUTE COUNT: 0.2 10*9/L (ref 0.0–0.5)
EOSINOPHILS RELATIVE PERCENT: 3.1 %
HEMATOCRIT: 38.5 % — ABNORMAL LOW (ref 39.0–48.0)
HEMOGLOBIN: 12.7 g/dL — ABNORMAL LOW (ref 12.9–16.5)
LYMPHOCYTES ABSOLUTE COUNT: 1.4 10*9/L (ref 1.1–3.6)
LYMPHOCYTES RELATIVE PERCENT: 17.9 %
MEAN CORPUSCULAR HEMOGLOBIN CONC: 32.9 g/dL (ref 32.0–36.0)
MEAN CORPUSCULAR HEMOGLOBIN: 29 pg (ref 25.9–32.4)
MEAN CORPUSCULAR VOLUME: 88.3 fL (ref 77.6–95.7)
MEAN PLATELET VOLUME: 8.4 fL (ref 6.8–10.7)
MONOCYTES ABSOLUTE COUNT: 1.1 10*9/L — ABNORMAL HIGH (ref 0.3–0.8)
MONOCYTES RELATIVE PERCENT: 14.2 %
NEUTROPHILS ABSOLUTE COUNT: 4.9 10*9/L (ref 1.8–7.8)
NEUTROPHILS RELATIVE PERCENT: 63.5 %
NUCLEATED RED BLOOD CELLS: 0 /100{WBCs} (ref <=4)
PLATELET COUNT: 234 10*9/L (ref 150–450)
RED BLOOD CELL COUNT: 4.36 10*12/L (ref 4.26–5.60)
RED CELL DISTRIBUTION WIDTH: 15.6 % — ABNORMAL HIGH (ref 12.2–15.2)
WBC ADJUSTED: 7.8 10*9/L (ref 3.6–11.2)

## 2024-07-03 LAB — AMMONIA: AMMONIA: 16 umol/L (ref 11–32)

## 2024-07-03 LAB — LIPID PANEL
CHOLESTEROL: 88 mg/dL (ref <200)
HDL CHOLESTEROL: 36 mg/dL — ABNORMAL LOW (ref >40)
LDL CHOLESTEROL CALCULATED: 36 mg/dL (ref <100)
NON-HDL CHOLESTEROL: 52 mg/dL (ref <130)
TRIGLYCERIDES: 124 mg/dL (ref <150)

## 2024-07-03 LAB — TSH: THYROID STIMULATING HORMONE: 3.214 u[IU]/mL (ref 0.550–4.780)

## 2024-07-03 NOTE — Consults (Signed)
 NEUROSURGERY CONSULT NOTE      Requesting Attending Physician:  Elsie Jama Capuchin, MD  Service Requesting Consult:  Emergency Medicine    Assessment and Recommendations  Frank Leach is a 73 y.o. 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  Patient's clinical examination demonstrates mild right sided weakness.  His CT demonstrates left frontal edema with a suspected lesion.  Patient requires additional workup with MRI brain and CT chest abdomen pelvis.    - No emergent neurosurgical intervention indicated at this time  - Recommend MRI brain with and without contrast tumor protocol  - Recommend CT chest abdomen pelvis with contrast for metastatic evaluation      Patient was discussed with Nat Cary, MD and staffed with Oneil Pillar, MD.   For questions/concerns regarding patients:  Mon-Fri 6 AM-6 PM, please page Cranial Neurosurgery Consult Service at [phone]  Sun-Thurs 6 PM-6 AM AND weekends/holidays, please page Neurosurgery Night Float/ On-Call Service       Problem List  Active Problems:    * No active hospital problems. *      History of Present Illness  Frank Leach is a 73 y.o. male seen in consultation at the request of Elsie Jama Capuchin, MD for evaluation of frontal brain lesion.  Patient has a history of ESRD s/p renal transplant in 2020.  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  He presents with 1 to 2 weeks of imbalance and difficulty ambulating with subjective right-sided weakness but without falls.  He denies paresthesias, headaches, nausea, vomiting, fevers, chills, night sweats.    Review of Systems  A 10-system review of systems was conducted and was negative except as documented above in the HPI.  ______________________________________________________________________    Physical Exam  General: No acute distress; Body mass index is 25.06 kg/m??.    Neurological Exam:  Eyes open spontaneously  Oriented to name, location, time  Pupils equal and reactive bilaterally  Extraocular movements intact bilaterally  Face symmetric  Tongue protrudes in the midline  Follows commands x 4  RUE 4  LUE 5  RLE 4  LLE 5  No pronator drift  Sensation to light touch intact throughout    Neurological imaging reviewed  CT head imaging from 07/03/2024 was personally reviewed demonstrating: Left frontal edema with suspected small lesion poorly characterized    The patient's available vitals, intake/output, medications, labs, and relevant neurological imaging were independently reviewed.    ---Historical data---    Allergies  Patient has no known allergies.    Medications  Reviewed in Epic.  Medications relevant to this consult listed below.    Past Medical History  Past Medical History[1]    Past Surgical History  Past Surgical History[2]    Family History  Family History[3]    Social History  Short Social History[4]                 [1]   Past Medical History:  Diagnosis Date    Anemia     Cancer    (CMS-HCC)     clear cell adenocarcinoma of left kidney    Diabetes mellitus (CMS-HCC)     Diabetic nephropathy (CMS-HCC)     ESRD (end stage renal disease) on dialysis    (CMS-HCC)  ESRD on dialysis (CMS-HCC)     Gout     Hyperlipidemia     Hypertension    [2]   Past Surgical History:  Procedure Laterality Date    AV FISTULA PLACEMENT      PR COLONOSCOPY FLX DX W/COLLJ SPEC WHEN PFRMD N/A 04/30/2018    Procedure: COLONOSCOPY, FLEXIBLE, PROXIMAL TO SPLENIC FLEXURE; DIAGNOSTIC, W/WO COLLECTION SPECIMEN BY BRUSH OR WASH;  Surgeon: Dorn Lynwood Lauth, MD;  [facility];  Service: Gastroenterology    PR COLONOSCOPY W/BIOPSY SINGLE/MULTIPLE N/A 04/30/2018    Procedure: COLONOSCOPY, FLEXIBLE, PROXIMAL TO SPLENIC FLEXURE; WITH BIOPSY, SINGLE OR MULTIPLE;  Surgeon: Dorn Lynwood Lauth, MD;  [facility];  Service: Gastroenterology    PR COLSC FLX W/RMVL OF TUMOR POLYP LESION SNARE TQ N/A 04/30/2018    Procedure: COLONOSCOPY FLEX; W/REMOV TUMOR/LES BY SNARE;  Surgeon: Dorn Lynwood Lauth, MD;  Location: [facility];  Service: Gastroenterology    PR EXPLORATORY OF ABDOMEN Midline 04/14/2016    Procedure: EXPLORATORY LAPAROTOMY, EXPLORATORY CELIOTOMY WITH OR WITHOUT BIOPSY(S);  Surgeon: Marsa Sam Boss, MD;  Location: [facility] OR [facility];  Service: Transplant    PR EXPLORATORY OF ABDOMEN N/A 04/16/2016    Procedure: EXPLORATORY LAPAROTOMY, EXPLORATORY CELIOTOMY WITH OR WITHOUT BIOPSY(S);  Surgeon: Marsa Sam Boss, MD;  Location: MAIN OR [facility];  Service: Transplant    PR LAP, RADICAL NEPHRECTOMY Right 12/27/2016    Procedure: Robotic Xi Laparoscopy; Radical Nephrectomy (Incl Remove Gerota`S Fascia, Fatty Tissue, Reg Lymph Node, Adrenalectomy);  Surgeon: Clorinda Ole Chalk, MD;  Location: MAIN OR [facility];  Service: Urology    PR LIGATN ANGIOACCESS AV FISTULA Left 11/29/2022    Procedure: LIGATION OR BANDING OF ANGIOACCESS ARTERIOVENOUS FISTULA;  Surgeon: Tol[place], Marsa Sam, MD;  Location: MAIN OR [place];  Service: Transplant    PR NEGATIVE PRESSURE WOUND THERAPY DME >50 SQ CM N/A 04/12/2016    Procedure: NEG PRESS WOUND TX (VAC ASSIST) INCL TOPICALS, PER SESSION, TSA GREATER THAN/= 50 CM SQUARED;  Surgeon: Marsa Sam Boss, MD;  Location: MAIN OR [facility];  Service: Transplant    PR NEGATIVE PRESSURE WOUND THERAPY DME >50 SQ CM Bilateral 04/14/2016    Procedure: NEG PRESS WOUND TX (VAC ASSIST) INCL TOPICALS, PER SESSION, TSA GREATER THAN/= 50 CM SQUARED;  Surgeon: Marsa Sam Boss, MD;  Location: MAIN OR [place];  Service: Transplant    PR NEGATIVE PRESSURE WOUND THERAPY DME >50 SQ CM N/A 04/16/2016    Procedure: NEG PRESS WOUND TX (VAC ASSIST) INCL TOPICALS, PER SESSION, TSA GREATER THAN/= 50 CM SQUARED;  Surgeon: Marsa Sam Boss, MD;  Location: MAIN OR [facility];  Service: Transplant    PR NEPHRECTOMY, W/PART. URETECTOMY Bilateral 04/11/2016    Procedure: LAPAROSCOPY, SURGICAL, NEPHRECTOMY WITH TOTAL URETERECTOMY;  Surgeon: Marsa Sam Boss, MD;  Location: MA[facility] OR [facility];  Service: Transplant    PR REMV KIDNEY,W/RIB RESECTION Bilateral 04/11/2016    Procedure: NEPHRECTOMY, INCLUDING PARTIAL URETERECTOMY, ANY OPEN APPROACH INCLUDING RIB RESECTION;  Surgeon: Marsa Sam Boss, MD;  Location: MAIN OR [facility];  Service: Transplant    PR TRANSPLANT,PREP LIVING  RENAL GRAFT N/A 06/22/2019    Procedure: BACKBENCH STD PREP LIVING DONOR RENAL ALLGRFT (OPEN/LAPROSC) PRIOR TO TRANSPLANT, INC DISSECT/REM AS NECESS;  Surgeon: Marsa Sam Boss, MD;  Location: MAIN OR [facility];  Service: Transplant    PR TRANSPLANTATION OF KIDNEY N/A 06/22/2019    Procedure: RENAL ALLOTRANSPLANTATION, IMPLANTATION OF GRAFT; WITHOUT RECIPIENT NEPHRECTOMY;  Surgeon: Marsa Sam Boss, MD;  Location: MAIN OR [place];  Service: Transplant    SPLENECTOMY     [3]   Family History  Problem Relation Age of Onset    Kidney disease Mother     Cancer Father     Kidney disease Brother    [4]   Social History  Tobacco Use    Smoking status: Never    Smokeless tobacco: Never   Vaping Use    Vaping status: Never Used   Substance Use Topics    Alcohol use: No    Drug use: No

## 2024-07-03 NOTE — ED Progress Note (Signed)
 Transfer from [facility] emergency department for neurosurgery evaluation given    Illness Severity: watcher  Patient Summary: Frank Leach is a 73 y.o. male past medical history notable for diabetes mellitus, diabetic nephropathy, ESRD on HD now s/p renal transplant, hyperlipidemia and hypertension presenting as a transfer from [facility] emergency department secondary to a CT scan demonstrating a left frontal mass lesion with concern for vasogenic edema.  Patient reportedly had experienced weakness of the right leg and difficulty maintaining balance over the last week which worsened to the point where he is unable to stand without assistance.  No recent vision changes falls or head injuries.  Also developed a tremor in his hands a couple days ago.  While in [facility] emergency department had a CT scan demonstrating the after mentioned findings.  He was given 10 mg of Decadron  and transferred for neurosurgery evaluation.  Action List:   Page neurosurgery on arrival  Reevaluate     ED Course as of 07/04/24 0654   Fri Jul 04, 2024   0004 Patient reevaluated has normal mentation.  Does not appear to be significant focal neurologic deficits at this time, mildly reduced strength of right upper extremity, though appears preserved in all other extremities.  Spoke with neurosurgery who recommended obtaining MRI of the brain with and without contrast to further evaluate in addition to CT chest abdomen pelvis to evaluate for other sites of potential malignancy.   0300 CT Chest W Contrast  IMPRESSION:     1.  No acute abnormality of the chest.     2.  No evidence of metastatic disease within the chest.   3.  Mildly enlarged pulmonary artery can be seen with pulmonary hypertension.     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 Recommend Urologic consultation for cystoscopy. 0600 Spoke with neurosurgery, will plan to reconnect following completion of MRI brain to further characterize underlying lesion to determine primary admitting team.   0630 Radiation oncology paged, pending recommendations at this time.   [phone] Care signed out to oncoming resident, pending completion of MRI brain.  Plan to reconnect with neurosurgery for final recommendations at that time determine disposition team.  Patient required admission for further workup and new onset left frontal lesion with cerebral edema s/p Decadron  in addition to urothelial mass concerning for bladder cancer.  Will require urology consultation during hospitalization

## 2024-07-03 NOTE — ED Provider Notes (Signed)
 [facility]  Emergency Department Provider Note     ED Clinical Impression     Final diagnoses:   None      HPI, Medical Decision Making, ED Course     HPI: 73 y.o. male who has a past medical history of Anemia, Cancer    (CMS-HCC), Diabetes mellitus (CMS-HCC), Diabetic nephropathy (CMS-HCC), ESRD (end stage renal disease) on dialysis    (CMS-HCC), ESRD on dialysis (CMS-HCC), Gout, Hyperlipidemia, and Hypertension. who presents with difficulty walking, right lower extremity weakness.  History of Present Illness  Frank Leach is a 73 year old male with a history of transplant who presents with weakness and difficulty walking. He is accompanied by his wife.    He has experienced weakness in his right leg and difficulty maintaining balance for the past week, which has worsened to the point where he was unable to rise from a chair without assistance at his daughter's house. No recent vision changes, falls, or head injuries. Occasional back pain, relieved with Tylenol , but not currently present.    He notes a tremor in both hands that began a couple of days ago. The weakness is not associated with pain, as he states 'it's not because it hurts.'    He undergoes regular monthly lab checks for his transplant, with the last check being last month and the next scheduled for Monday. He confirms adherence to his medication regimen.    DDx/MDM: Frank Leach 73 year old male past medical history of diabetic nephropathy, diabetes, kidney transplant who is here with weakness, difficulty ambulating.  Of note on physical exam does appear to have 4-5 disease antigravity in his right upper and right lower extremity but is not able to hold against my force compared to the left which is full 5 out of 5.  Cranial nerves grossly intact however.  Does walk with a little bit of ataxia or antalgic gait but suspect this is due to the right lower extremity weakness.  Differential includes resolved and residual stroke such as MCA, but does not affect his speech necessarily.  Could also be for example uremia if he is having transplant rejection but thought that is one-sided.  Will obtain CT head and lab work in order to delineate this.  Suspect will need to talk with neurology.    Addendum:  MRI showed vasogenic edema and likely mass-attempted to discuss with neurosurgery through [facility] line however unable to reach provider.  As a result transferred patient to [facility] for MRI, in person neurosurgery evaluation.  Decadron  administered given known vasogenic edema.  Patient transferred in stable condition.  Discussed with him and his family multiple times-they voiced appreciation to our discussion and further workup would hopefully yield answers and the path forward in his care.    Orders Placed This Encounter   Procedures    CT head WO contrast    CBC w/ Differential    Comprehensive Metabolic Panel    Tacrolimus  Level, Trough    Ammonia    TSH    Hemoglobin A1c    Lipid panel    Insert peripheral IV       ED Course       Discussion of Management with other Physicians, QHP or Appropriate Source: If applicable, documented in the ED course as above  Independent Interpretation of Studies: If applicable, documented in ED course above.  {I have reviewed recent and relevant previous record, including: Documented to the ED course above, if not present and not applicable.  Prescription drugs considered but not prescribed: Antibiotics - no serious bacterial infection identified  Diagnostic tests considered but not performed: MRI-unable to obtain at [facility].  As a result transferred to the main hospital.    Additional History Elements     Chief Complaint  Chief Complaint   Patient presents with    Tremors     R arm     Additional Historian(s): the patient's spouse    Past Medical History[1]    Past Surgical History[2]    Active Medications[3]     Allergies[4]    Family History[5]    Short Social History[6]     Physical Exam     VITAL SIGNS:      Vitals: 07/03/24 1913   BP: 179/77   Pulse: 75   Resp: 18   Temp: 37.1 ??C (98.8 ??F)   TempSrc: Tympanic   SpO2: 99%   Weight: 72.6 kg (160 lb)   Height: 170.2 cm (5' 7)         Physical Exam  GENERAL: Alert, cooperative, well developed, no acute distress.  HEENT: Normocephalic, normal oropharynx, moist mucous membranes.  CHEST: Clear to auscultation bilaterally, no wheezes, rhonchi, or crackles.  CARDIOVASCULAR: Normal heart rate and rhythm, S1 and S2 normal without murmurs.  ABDOMEN: Soft, non-tender, non-distended, without organomegaly, normal bowel sounds.  EXTREMITIES: No cyanosis or edema.  NEUROLOGICAL: Cranial nerves II-XII grossly intact. Right leg and arm weakness observed-4 out of 5 strength. Left hand grip strength stronger than right. Finger-to-nose test shows left hand quicker than right-notably slow but no overt dysdiadochokinesia noted.  Gait shows right foot dragging. Sensation intact bilaterally.         Radiology     CT head WO contrast   Final Result      Vasogenic edema within the left frontal lobe concerning for a mass lesion. Recommend MRI brain with contrast for better characterization.      ++++++++++++++++++++      The findings of this study were discussed via epic chat with DR. Malene Blaydes [name] by Dr. Prentice Kansky on 07/03/2024 8:22 PM.      -----------------------------------------------                Portions of this record have been created using Dragon dictation software. Dictation errors have been sought, but may not have been identified and corrected.           [1]   Past Medical History:  Diagnosis Date    Anemia     Cancer    (CMS-HCC)     clear cell adenocarcinoma of left kidney    Diabetes mellitus (CMS-HCC)     Diabetic nephropathy (CMS-HCC)     ESRD (end stage renal disease) on dialysis    (CMS-HCC)     ESRD on dialysis (CMS-HCC)     Gout     Hyperlipidemia     Hypertension    [2]   Past Surgical History:  Procedure Laterality Date    AV FISTULA PLACEMENT      PR COLONOSCOPY FLX DX W/COLLJ SPEC WHEN PFRMD N/A 04/30/2018    Procedure: COLONOSCOPY, FLEXIBLE, PROXIMAL TO SPLENIC FLEXURE; DIAGNOSTIC, W/WO COLLECTION SPECIMEN BY BRUSH OR WASH;  Surgeon: Dorn Lynwood Lauth, MD;  Location: [facility];  Service: Gastroenterology    PR COLONOSCOPY W/BIOPSY SINGLE/MULTIPLE N/A 04/30/2018    Procedure: COLONOSCOPY, FLEXIBLE, PROXIMAL TO SPLENIC FLEXURE; WITH BIOPSY, SINGLE OR MULTIPLE;  Surgeon: Dorn Lynwood Lauth, MD;  Location:  [facility];  Service: Gastroenterology    PR COLSC FLX W/RMVL OF TUMOR POLYP LESION SNARE TQ N/A 04/30/2018    Procedure: COLONOSCOPY FLEX; W/REMOV TUMOR/LES BY SNARE;  Surgeon: Dorn Lynwood Lauth, MD;  Location: [facility];  Service: Gastroenterology    PR EXPLORATORY OF ABDOMEN Midline 04/14/2016    Procedure: EXPLORATORY LAPAROTOMY, EXPLORATORY CELIOTOMY WITH OR WITHOUT BIOPSY(S);  Surgeon: Marsa Sam Boss, MD;  Location: MAIN OR [facility];  Service: Transplant    PR EXPLORATORY OF ABDOMEN N/A 04/16/2016    Procedure: EXPLORATORY LAPAROTOMY, EXPLORATORY CELIOTOMY WITH OR WITHOUT BIOPSY(S);  Surgeon: Marsa Sam Boss, MD;  Location: MAIN OR [facility];  Service: Transplant    PR LAP, RADICAL NEPHRECTOMY Right 12/27/2016    Procedure: Robotic Xi Laparoscopy; Radical Nephrectomy (Incl Remove Gerota`S Fascia, Fatty Tissue, Reg Lymph Node, Adrenalectomy);  Surgeon: Clorinda Ole Chalk, MD;  Location: MA[facility] OR [facility];  Service: Urology    PR LIGATN ANGIOACCESS AV FISTULA Left 11/29/2022    Procedure: LIGATION OR BANDING OF ANGIOACCESS ARTERIOVENOUS FISTULA;  Surgeon: [place], Marsa Sam, MD;  Location: MAIN OR [place];  Service: Transplant    PR NEGATIVE PRESSURE WOUND THERAPY DME >50 SQ CM N/A 04/12/2016    Procedure: NEG PRESS WOUND TX (VAC ASSIST) INCL TOPICALS, PER SESSION, TSA GREATER THAN/= 50 CM SQUARED;  Surgeon: Marsa Sam Boss, MD;  Location: MAIN OR [place];  Service: Transplant    PR NEGATIVE PRESSURE WOUND THERAPY DME >50 SQ CM Bilateral 04/14/2016    Procedure: NEG PRESS WOUND TX (VAC ASSIST) INCL TOPICALS, PER SESSION, TSA GREATER THAN/= 50 CM SQUARED;  Surgeon: Marsa Sam Boss, MD;  Location: MAIN OR [place];  Service: Transplant    PR NEGATIVE PRESSURE WOUND THERAPY DME >50 SQ CM N/A 04/16/2016    Procedure: NEG PRESS WOUND TX (VAC ASSIST) INCL TOPICALS, PER SESSION, TSA GREATER THAN/= 50 CM SQUARED;  Surgeon: Marsa Sam Boss, MD;  Location: MAIN OR [facility];  Service: Transplant    PR NEPHRECTOMY, W/PART. URETECTOMY Bilateral 04/11/2016    Procedure: LAPAROSCOPY, SURGICAL, NEPHRECTOMY WITH TOTAL URETERECTOMY;  Surgeon: Marsa Sam Boss, MD;  Location: MAIN OR [facility];  Service: Transplant    PR REMV KIDNEY,W/RIB RESECTION Bilateral 04/11/2016    Procedure: NEPHRECTOMY, INCLUDING PARTIAL URETERECTOMY, ANY OPEN APPROACH INCLUDING RIB RESECTION;  Surgeon: Marsa Sam Boss, MD;  Location: MA[facility] OR [facility];  Service: Transplant    PR TRANSPLANT,PREP LIVING  RENAL GRAFT N/A 06/22/2019    Procedure: BACKBENCH STD PREP LIVING DONOR RENAL ALLGRFT (OPEN/LAPROSC) PRIOR TO TRANSPLANT, INC DISSECT/REM AS NECESS;  Surgeon: Marsa Sam Boss, MD;  Location: MAIN OR [facility];  Service: Transplant    PR TRANSPLANTATION OF KIDNEY N/A 06/22/2019    Procedure: RENAL ALLOTRANSPLANTATION, IMPLANTATION OF GRAFT; WITHOUT RECIPIENT NEPHRECTOMY;  Surgeon: Marsa Sam Boss, MD;  Location: MAIN OR [facility];  Service: Transplant    SPLENECTOMY     [3]   No current facility-administered medications for this encounter.     Current Outpatient Medications   Medication Sig Dispense Refill    acetaminophen  (TYLENOL ) 500 MG tablet Take 1-2 tablets (500-1,000 mg total) by mouth every six (6) hours as needed for pain or fever (> 38C). 100 tablet 0    allopurinol  (ZYLOPRIM ) 100 MG tablet Take 1 tablet (100 mg total) by mouth every evening. 90 tablet 3    amlodipine  (NORVASC ) 10 MG tablet Take 1 tablet (10 mg total) by mouth daily. 90 tablet 3    atorvastatin  (LIPITOR ) 40 MG tablet  Take 1 tablet (40 mg total) by mouth daily. 90 tablet 3    ENVARSUS  XR 4 mg Tb24 extended release tablet Take 1 tablet (4 mg total) by mouth daily. Take in addition to TWO 1 mg tablets for a total daily dose of 6 mg. 60 tablet 11    magnesium oxide-Mg AA chelate (MAGNESIUM, AMINO ACID CHELATE,) 133 mg Take 1 tablet by mouth two (2) times a day. 100 tablet 11    metFORMIN  (GLUCOPHAGE -XR) 500 MG 24 hr tablet Take 1 tablet (500 mg total) by mouth in the morning. 90 tablet 3    mycophenolate  (MYFORTIC ) 180 MG EC tablet Take 2 tablets (360 mg total) by mouth two (2) times a day. 180 tablet 3    olmesartan  (BENICAR ) 40 MG tablet Take 1 tablet (40 mg total) by mouth daily. 90 tablet 3    oxyCODONE  (ROXICODONE ) 5 MG immediate release tablet Take 1 tablet (5 mg total) by mouth every four (4) hours as needed for pain. 10 tablet 0    tacrolimus  (ENVARSUS  XR) 1 mg Tb24 extended release tablet Take 2 tablets (2 mg total) by mouth in the morning. Take in addition to 4 mg tablet for a total daily dose of 6 mg . 180 tablet 3   [4] No Known Allergies  [5]   Family History  Problem Relation Age of Onset    Kidney disease Mother     Cancer Father     Kidney disease Brother    [6]   Social History  Tobacco Use    Smoking status: Never    Smokeless tobacco: Never   Vaping Use    Vaping status: Never Used   Substance Use Topics    Alcohol use: No    Drug use: No        Wilfred Gustav Bien, MD  Resident  07/04/24 [phone]

## 2024-07-03 NOTE — ED Triage Note (Signed)
 Here via WC for R arm tremors and R leg weakness/dragging, visible tremors to both arms but more prominent on R. Symptoms started last week. Denies CP, SOB, numbness, tingling, injury/trauma, fall. Stroke screen neg in triage.     Kidney transplant patient.

## 2024-07-03 NOTE — Telephone Encounter (Signed)
 Emergency Medicine Access Physician [facility])  [facility] - Transfer Request Note    Requesting Provider: Dr. Wilfred    Requesting Hospital: [facility]    Requesting Service: Emergency Medicine    Reason for transfer request: Neurosurgery consultation    Overview of ED course at transferring hospital: Prakash Kimberling is a 73 y.o. male with 2 wk history of RUE/RLE weakness. CT head shows Vasogenic edema within the left frontal lobe concerning for a mass lesion.. Awake and alert. Patient has history of kidney transplant with creatinine at baseline (1.79). Coming by transport.     Was the patient accepted to the [facility] ED in transfer: Yes.  If no, why?: N/A    Accepting service/expected service information:  Has an inpatient or consulting service been contacted by the [facility], [facility] or the referring provider?: No.  If admitting/consult service contacted by [facility]:  Time of discussion: N/A  Name/service (e.g. Dr. Norleen Sharps, neprology) of inpatient team member/consultant: N/A  Role of admit/consult service member: N/A  Brief summary of call: N/A  Anticipated Inpatient Bed Type Needed: To be determined    Outside Hospital Imaging:  Was Imaging Done at the Referring Hospital:  Yes.  If yes, what studies?: Documented above in overview section  [employer]:  [employer], images available in [employer]

## 2024-07-04 ENCOUNTER — Inpatient Hospital Stay
Admission: EM | Admit: 2024-07-04 | Discharge: 2024-07-09 | Disposition: A | Discharge status: Home Health | Payer: Medicare (Managed Care) | Admitting: Hospitalist

## 2024-07-04 ENCOUNTER — Ambulatory Visit
Admission: EM | Admit: 2024-07-04 | Discharge: 2024-07-09 | Disposition: A | Discharge status: Home Health | Payer: Medicare (Managed Care) | Admitting: Hospitalist

## 2024-07-04 LAB — HEMOGLOBIN A1C
ESTIMATED AVERAGE GLUCOSE: 148 mg/dL
HEMOGLOBIN A1C: 6.8 % — ABNORMAL HIGH (ref 4.8–5.6)

## 2024-07-04 LAB — TACROLIMUS LEVEL, TROUGH: TACROLIMUS, TROUGH: 7.6 ng/mL (ref 5.0–15.0)

## 2024-07-04 MED ADMIN — iohexol (OMNIPAQUE) 350 mg iodine/mL solution 100 mL: 100 mL | INTRAVENOUS | @ 07:00:00 | Stop: 2024-07-04

## 2024-07-04 MED ADMIN — tacrolimus (ENVARSUS XR) extended release tablet 5 mg: 5 mg | ORAL | @ 13:00:00

## 2024-07-04 MED ADMIN — atorvastatin (LIPITOR) tablet 40 mg: 40 mg | ORAL | @ 08:00:00

## 2024-07-04 MED ADMIN — dexAMETHasone (DECADRON) 4 mg/mL injection 10 mg: 10 mg | INTRAVENOUS | @ 04:00:00 | Stop: 2024-07-03

## 2024-07-04 MED ADMIN — mycophenolate (MYFORTIC) EC tablet 360 mg: 360 mg | ORAL | @ 12:00:00 | Stop: 2024-07-04

## 2024-07-04 MED ADMIN — dexAMETHasone (DECADRON) 4 mg/mL injection 4 mg: 4 mg | INTRAVENOUS | @ 19:00:00 | Stop: 2024-07-04

## 2024-07-04 MED ADMIN — dexAMETHasone (DECADRON) 4 mg/mL injection 4 mg: 4 mg | INTRAVENOUS | @ 12:00:00 | Stop: 2024-07-04

## 2024-07-04 MED ADMIN — amlodipine (NORVASC) tablet 10 mg: 10 mg | ORAL | @ 08:00:00 | Stop: 2024-07-04

## 2024-07-04 MED ADMIN — gadopiclenol (ELUCIREM,VUEWAY) injection 7.26 mL: .1 mL/kg | INTRAVENOUS | @ 12:00:00 | Stop: 2024-07-04

## 2024-07-04 NOTE — Consults (Signed)
 Tacrolimus  Therapeutic Monitoring Pharmacy Note    Frank Leach is a 73 y.o. male continuing tacrolimus .     Indication: Kidney transplant     Date of Transplant: 06/22/2019      Prior Dosing Information: Home regimen tacrolimus  XR 5 mg daily     Source(s) of information used to determine prior to admission dosing: Patient/Caregiver or Clinic Note    Goals:  Therapeutic Drug Levels  Tacrolimus  trough goal: 5-7 ng/mL    Additional Clinical Monitoring/Outcomes  Monitor renal function (SCr and urine output) and liver function (LFTs)  Monitor for signs/symptoms of adverse events (e.g., hyperglycemia, hyperkalemia, hypomagnesemia, hypertension, headache, tremor)    Previous Lab Values  Tacrolimus , Trough   Date/Time Value Ref Range Status   06/09/2024 07:36 AM 5.3 5.0 - 15.0 ng/mL Final   05/07/2024 07:36 AM 4.9 (L) 5.0 - 15.0 ng/mL Final   04/08/2024 07:50 AM 4.8 (L) 5.0 - 15.0 ng/mL Final   03/10/2024 07:38 AM 7.1 5.0 - 15.0 ng/mL Final   01/28/2024 07:33 AM 6.0 5.0 - 15.0 ng/mL Final       Result:  Tacrolimus  level from most recent outpatient draw was drawn appropriately     Pharmacokinetic Considerations and Significant Drug Interactions:  Concurrent CYP3A4 substrates/inhibitors: None identified    Assessment/Plan:  Recommendedation(s)  Continue current regimen of tacrolimus  XR 5 mg daily    Follow-up  Next level has been ordered on 07/05/24 at 0600.   A pharmacist will continue to monitor and recommend levels as appropriate    Please page service pharmacist with questions/clarifications.    Kemond Amorin, PharmD, BCPS

## 2024-07-04 NOTE — Progress Notes (Signed)
 CR[facility] NEUROSURGERY CONSULT PROGRESS NOTE    For questions/concerns regarding patients:  Mon-Fri 6 AM-6 PM, please page Cranial Neurosurgery Consult Service at [phone]  Sun-Thurs 6 PM-6 AM AND weekends/holidays, please page Neurosurgery Night Float/ On-Call Service        Requesting Service Attending Physician:  Nathanael Redia Sable  Neurosurgery Attending of Record: Oneil Pillar, MD     Brief History of Present Illness  Frank Leach is a 73 y.o. 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 Autoimmune less likely given extent of necrosis and abscess less likely given well appearance.   - No emergent intervention indicated at this time   - Plan to discuss with neurosurgical oncology team regarding possible tissue sampling on Monday 12/1  - PT/OT   - Steroids per oncology   - Rad onc consult   ______________________________________________  Neurological Exam   This is an imaging note only     Problem List  Principal Problem:    Frontal mass of brain  Active Problems:    Hyperlipidemia    HTN (hypertension)    Kidney replaced by transplant (HHS-HCC)    Bladder mass    Left leg weakness

## 2024-07-04 NOTE — Consults (Signed)
 Transplant Nephrology Consult     Requesting Attending Physician :  Frank Arshad Janjua, DO  Service Requesting Consult : [facility]  Reason for Consult: kidney transplant recipient, assistance with immunosuppression management    Assessment and Plan:     # S/p Kidney Transplant, Kidney allograft function (stable):  - Serum creatinine level was 1.79 on 11/27, which is within his usual baseline of approximately 1.7-1.9. The patient reports non-oliguric urine output and no symptoms with urination. Will continue to monitor with you.    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 It is more likely that his brain lesion is the etiology of his difficulty walking and the tremors. Appreciate Neurosurgery assistance with case. The patient is being treated with dexamethasone .  -Urology consultation re: bladder mass tissue sampling. Of note, this bladder mass was not reported to be visualized on his 04/16/24 native+transplant renal ultrasound.    # Immunosuppression:  - due to likely malignancy, recommend discontinuation of mycophenolate   - Continue Envarsus  at current dose of 5 mg daily  - Please obtain trough tacrolimus  trough levels prior to the morning dose of the medication and approximately 24hrs after prior dose. The  goal tacrolimus  level is 4-6 ng/mL.  - Once the patient has completed his course of dexamethasone , would start prednisone  5 mg daily.    # Blood Pressure / Volume:  - nomotensive this morning. No new interventions.  - Trend BP--if he becomes hypertensive, can resume his home amlodipine10 mg daily  - Hold home olmesartan         RECOMMENDATIONS:   - Hold mycophenolate   - Hold olmesartan . Can resume amlodipine  at home dose of 10 mg daily if BP trends higher.  - Check tacrolimus  24hr trough levels and target levels of 4-6 ng/mL  - Transplant patients with an open wound require wound care with sterile water  only. The patient should be counseled on this at the time of discharge if they have not already been doing this.  - We will continue to follow.     [name], DO  07/04/2024 1:08 PM     Medical decision-making for 07/04/24  Findings / Data     Patient has: []  acute illness w/systemic sxs  [mod]  []  two or more stable chronic illnesses [mod]  []  one chronic illness with acute exacerbation [mod]  []  acute complicated illness  [mod]  []  Undiagnosed new problem with uncertain prognosis  [mod] [x]  illness posing risk to life or bodily function (ex. AKI)  [high]  []  chronic illness with severe exacerbation/progression  [high]  []  chronic illness with severe side effects of treatment  [high] Kidney transplant recipient, brain mass, bladder mass, difficulty with ambulation Probs At least 2:  Probs, Data, Risk   I reviewed: []  primary team note  []  consultant note(s)  []  external records [x]  chemistry results  [x]  CBC results  []  blood gas results  []  Other []  procedure/op note(s)   [x]  radiology report(s)  []  micro result(s)  []  w/ independent historian(s) Cr at baseline, reviewed CT abd/pelvis and MRI brain (see above) >=3 Data Review (2 of 3)    I independently  interpreted: []  Urine Sediment  []  Renal US  []  CXR Images  []  CT Images  []  Other []  EKG Tracing  Any     I discussed: []  Pathology results w/ QHPs(s) from other specialties  []  Procedural findings w/ QHPs(s) from other specialties []  Imaging w/ QHP(s) from other specialties  [x]  Treatment plan w/ QHP(s) from other specialties Plan discussed with primary team Any     Mgm[facility] requires: []  Prescription drug(s)  [mod]  []  Kidney biopsy  [mod]  []  Central line placement  [mod] [x]  High risk medication use and/or intensive toxicity monitoring [high]  []  Renal replacement therapy [high]  []  High risk kidney biopsy  [high]  []  Escalation of care  [high]  []  High risk central line placement  [high] Immunosuppression: high risk for infection Risk      _____________________________________________________________________________________    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 Nephrology has been consulted for immunosuppression management.     The patient tells me he had about two weeks of difficulty walking (was dragging his right leg) and yesterday after thanksgiving dinner he was unable to stand on his own, so came to ED. He also notes that he has been having increased upper extremity tremors randomly through the day that come and go. He denies other symptoms.    In the ED initial CT head showed vasogenic edema in the left frontal lobe concerning for mass lesion.  An MRI was obtained that revealed a 4.4 cm left frontal lobe enhancing mass concerning for malignancy. 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 Laboratory data showed Cr stable at baseline and tacrolimus  evening trough level of 7.6 ng/mL    INPATIENT MEDICATIONS:  Current Medications[1]  OUTPATIENT MEDICATIONS:  Prior to Admission medications   Medication Dose, Route, Frequency   acetaminophen  (TYLENOL ) 500 MG tablet 500-1,000 mg, Oral, Every 6 hours PRN   allopurinol  (ZYLOPRIM ) 100 MG tablet 100 mg, Oral, Every evening   amlodipine  (NORVASC ) 10 MG tablet 10 mg, Oral, Daily (standard)   atorvastatin  (LIPITOR ) 40 MG tablet 40 mg, Oral, Daily (standard)   ENVARSUS  XR 4 mg Tb24 extended release tablet 4 mg, Oral, Daily (standard), Take in addition to TWO 1 mg tablets for a total daily dose of 6 mg.  Patient taking differently: Take 1 tablet (4 mg total) by mouth daily. Take in addition to ONE 1 mg tablets for a total daily dose of 5 mg.   latanoprost (XALATAN) 0.005 % ophthalmic solution 1 drop, Both Eyes, Nightly   magnesium oxide-Mg AA chelate (  MAGNESIUM, AMINO ACID CHELATE,) 133 mg 1 tablet, Oral, 2 times a day   metFORMIN  (GLUCOPHAGE -XR) 500 MG 24 hr tablet 500 mg, Oral, Daily (RT)   mycophenolate  (MYFORTIC ) 180 MG EC tablet 360 mg, Oral, 2 times a day (standard)   olmesartan  (BENICAR ) 40 MG tablet 40 mg, Oral, Daily (standard)   tacrolimus  (ENVARSUS  XR) 1 mg Tb24 extended release tablet 1 tablet, Oral, Daily, Take one tablet daily with ONE 4 mg tablet for a total daily dose of 5mg       ALLERGIES:  Patient has no known allergies.  MEDICAL HISTORY:  Past Medical History[2]  Past Surgical History[3]  SOCIAL HISTORY  Social History     Social History Narrative    Not on file      reports that he has never smoked. He has never used smokeless tobacco. He reports that he does not drink alcohol and does not use drugs.   FAMILY HISTORY  Family History[4]     Physical Exam:   Vitals:    07/04/24 0200 07/04/24 0519 07/04/24 0745 07/04/24 0749   BP:  123/76 136/77    Pulse: 80 75 74    Resp:  16 21    Temp:  36.7 ??C (98.1 ??F)  36.8 ??C (98.2 ??F)   TempSrc:  Oral  Oral   SpO2: 97% 96% 97%    Weight:       Height:         No intake/output data recorded.    Intake/Output Summary (Last 24 hours) at 07/04/2024 1308  Last data filed at 07/04/2024 0520  Gross per 24 hour   Intake --   Output 375 ml   Net -375 ml       Constitutional: no acute distress  Heart: regular rate and rhythm, no murmurs, rubs, or gallops  Lungs: clear to auscultation bilaterally without adventitious sounds  Abd: soft, non-tender, non-distended  Ext: no lower extremity edema    Neuro: awake, alert, oriented x3         [1]   Current Facility-Administered Medications:     acetaminophen  (TYLENOL ) tablet 650 mg, Oral, Q6H PRN    aluminum-magnesium hydroxide-simethicone (MAALOX MAX) 80-80-8 mg/mL oral suspension, Oral, Q4H PRN    atorvastatin  (LIPITOR ) tablet 40 mg, Oral, Daily    dexAMETHasone  (DECADRON ) 4 mg/mL injection 4 mg, Intravenous, Q6H    guaiFENesin (ROBITUSSIN) oral syrup, Oral, Q4H PRN    melatonin tablet 3 mg, Oral, Nightly PRN    senna (SENOKOT) tablet 2 tablet, Oral, Nightly PRN    tacrolimus  (ENVARSUS  XR) extended release tablet 5 mg, Oral, Daily  [2]   Past Medical History:  Diagnosis Date    Anemia     Cancer    (CMS-HCC)     clear cell adenocarcinoma of left kidney    Diabetes mellitus (CMS-HCC)     Diabetic nephropathy (CMS-HCC)     ESRD (end stage renal disease) on dialysis    (CMS-HCC)     ESRD on dialysis (CMS-HCC)     Gout     Hyperlipidemia     Hypertension    [3]   Past Surgical History:  Procedure Laterality Date    AV FISTULA PLACEMENT      PR COLONOSCOPY FLX DX W/COLLJ SPEC WHEN PFRMD N/A 04/30/2018    Procedure: COLONOSCOPY, FLEXIBLE, PROXIMAL TO SPLENIC FLEXURE; DIAGNOSTIC, W/WO COLLECTION SPECIMEN BY BRUSH OR WASH;  Surgeon: Dorn Lynwood Lauth, MD;  Location: [facility];  Service: Gastroenterology  PR COLONOSCOPY W/BIOPSY SINGLE/MULTIPLE N/A 04/30/2018    Procedure: COLONOSCOPY, FLEXIBLE, PROXIMAL TO SPLENIC FLEXURE; WITH BIOPSY, SINGLE OR MULTIPLE;  Surgeon: Dorn Lynwood Lauth, MD;  Location: [facility];  Service: Gastroenterology    PR COLSC FLX W/RMVL OF TUMOR POLYP LESION SNARE TQ N/A 04/30/2018    Procedure: COLONOSCOPY FLEX; W/REMOV TUMOR/LES BY SNARE;  Surgeon: Dorn Lynwood Lauth, MD;  Location: [facility] PROCED[facility];  Service: Gastroenterology    PR EXPLORATORY OF ABDOMEN Midline 04/14/2016    Procedure: EXPLORATORY LAPAROTOMY, EXPLORATORY CELIOTOMY WITH OR WITHOUT BIOPSY(S);  Surgeon: Marsa Sam Boss, MD;  Location: MAIN OR [facility];  Service: Transplant    PR EXPLORATORY OF ABDOMEN N/A 04/16/2016    Procedure: EXPLORATORY LAPAROTOMY, EXPLORATORY CELIOTOMY WITH OR WITHOUT BIOPSY(S);  Surgeon: Marsa Sam Boss, MD;  Location: MAIN OR [facility];  Service: Transplant    PR LAP, RADICAL NEPHRECTOMY Right 12/27/2016    Procedure: Robotic Xi Laparoscopy; Radical Nephrectomy (Incl Remove Gerota`S Fascia, Fatty Tissue, Reg Lymph Node, Adrenalectomy);  Surgeon: Clorinda Ole Chalk, MD;  Location: MAIN OR [facility];  Service: Urology    PR LIGATN ANGIOACCESS AV FISTULA Left 11/29/2022    Procedure: LIGATION OR BANDING OF ANGIOACCESS ARTERIOVENOUS FISTULA;  Surgeon: [place], Marsa Sam, MD;  Location: MAIN OR [place];  Service: Transplant    PR NEGATIVE PRESSURE WOUND THERAPY DME >50 SQ CM N/A 04/12/2016    Procedure: NEG PRESS WOUND TX (VAC ASSIST) INCL TOPICALS, PER SESSION, TSA GREATER THAN/= 50 CM SQUARED;  Surgeon: Marsa Sam Boss, MD;  Location: MAIN OR [place];  Service: Transplant    PR NEGATIVE PRESSURE WOUND THERAPY DME >50 SQ CM Bilateral 04/14/2016    Procedure: NEG PRESS WOUND TX (VAC ASSIST) INCL TOPICALS, PER SESSION, TSA GREATER THAN/= 50 CM SQUARED;  Surgeon: Marsa Sam Boss, MD;  Location: MAIN OR [place];  Service: Transplant    PR NEGATIVE PRESSURE WOUND THERAPY DME >50 SQ CM N/A 04/16/2016    Procedure: NEG PRESS WOUND TX (VAC ASSIST) INCL TOPICALS, PER SESSION, TSA GREATER THAN/= 50 CM SQUARED;  Surgeon: Marsa Sam Boss, MD;  Location: MAIN OR [facility];  Service: Transplant    PR NEPHRECTOMY, W/PART. URETECTOMY Bilateral 04/11/2016    Procedure: LAPAROSCOPY, SURGICAL, NEPHRECTOMY WITH TOTAL URETERECTOMY;  Surgeon: Marsa Sam Boss, MD;  Location: MAIN OR [facility];  Service: Transplant    PR REMV KIDNEY,W/RIB RESECTION Bilateral 04/11/2016    Procedure: NEPHRECTOMY, INCLUDING PARTIAL URETERECTOMY, ANY OPEN APPROACH INCLUDING RIB RESECTION;  Surgeon: Marsa Sam Boss, MD;  Location: MA[facility] OR [facility];  Service: Transplant    PR TRANSPLANT,PREP LIVING  RENAL GRAFT N/A 06/22/2019    Procedure: BACKBENCH STD PREP LIVING DONOR RENAL ALLGRFT (OPEN/LAPROSC) PRIOR TO TRANSPLANT, INC DISSECT/REM AS NECESS;  Surgeon: Marsa Sam Boss, MD;  Location: MAIN OR [facility];  Service: Transplant    PR TRANSPLANTATION OF KIDNEY N/A 06/22/2019    Procedure: RENAL ALLOTRANSPLANTATION, IMPLANTATION OF GRAFT; WITHOUT RECIPIENT NEPHRECTOMY;  Surgeon: Marsa Sam Boss, MD;  Location: MAIN OR [facility];  Service: Transplant    SPLENECTOMY     [4]   Family History  Problem Relation Age of Onset    Kidney disease Mother     Cancer Father     Kidney disease Brother

## 2024-07-04 NOTE — Consults (Signed)
 Oncology Consult Note    Requesting Attending Physician :  Leita GORMAN Chancy, MD  Service Requesting Consult : Emergency Medicine  Reason for Consult: c/f ne metastatic cancer  Primary Oncologist: TBD  Current Therapy: TBD    Assessment: Frank Leach is a 73 y.o. male with a history of anemia, T2DM, diabetic nephropathy, R kidney RCC s/p bilateral nephrectomy (2017, 2018) and DDKT (2020), gout, HLD, and HTN who presented with R-sided weakness and gait instability, now with imaging findings c/f bladder malignancy with brain metastasis. Oncology was consulted for evaluation of ?new metastatic cancer.     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 SABRA    He presented to [facility] ED on 11/27 with 1-2 weeks of R-sided weakness and gait instability. CTH showed L frontal lobe vasogenic edema with 4.4 cm peripherally enhancing mass observed on MRI brain. CTAP revealed a 2 cm bilobed mass vs adjacent synchronous masses on the R anterior bladder wall c/f malignant urothelial neoplasm (new since 04/2023 renal US ). No evidence of chest or abdominopelvic metastases; L3 sclerotic lesion appears stable.     Patient was started on steroids with symptomatic improvement.  He was seen by neurosurgery who felt that urgent surgical intervention was not indicated.  Recommend consulting urology for cystoscopy with bladder biopsy and radiation oncology for consideration of intracranial irradiation.    RECOMMENDATIONS  Consult urology for cystoscopy w/bladder biopsy        - will follow pathology  Appreciate neuro sx recs  Agree with dexamethasone  4 mg q6h  Consult rad onc  Oncology will continue to follow         This patient has been staffed with Dr. Tobie. These recommendations were discussed with the primary team.     Please contact the oncology consult fellow at [phone] with any further questions.    Gery Francis, MD  Hematology/Oncology Fellow  [facility]       -------------------------------------------------------------    HPI: Frank Leach is a 73 y.o. male with the above history who is being seen at the request of Leita GORMAN Chancy, MD for evaluation of ?new metastatic cancer.     Patient presented with 1-2 weeks of R-sided weakness and gait instability, found to have 4.4 cm L frontal lobe lesion with surrounding vasogenic edema and incidentally found right bladder wall neoplasm.  Patient endorsed vision changes, which he attributed to his glaucoma.  Otherwise denied headache, dizziness, nausea, vomiting, abdominal pain, hematuria, dysuria.    Review of Systems: All positive and pertinent negatives are noted in the HPI; a 10 system review of systems was otherwise negative except as noted in HPI.    Oncologic History:  Hematology/Oncology History    No problem history exists.       Past Medical History[1]    Past Surgical History[2]    Family History[3]     Social History [4]    Social History     Social History Narrative    Not on file       Allergies: has no known allergies.    Medications:   Meds:Scheduled Medications[5]  Continuous Infusions:Infusions Meds[6]  PRN Meds:.PRN Medications[7]    [No matching plan found]  [No matching plan found]    Objective:   Vitals: Temp:  [36.7 ??C (98.1 ??F)-37.1 ??C (98.8 ??F)] 36.8 ??C (98.2 ??F)  Pulse:  [  63-80] 74  SpO2 Pulse:  [69-75] 74  Resp:  [16-21] 21  BP: (123-179)/(70-83) 136/77  MAP (mmHg):  [90-105] 95  SpO2:  [94 %-100 %] 97 %  BMI (Calculated):  [25.05] 25.05  No intake/output data recorded.    ECOG Performance Status: 1 - Restricted in physically strenuous activity but ambulatory and able to carry out work of a light or sedentary nature, e.g., light house work, office work    Physical Exam:  BP 136/77  - Pulse 74  - Temp 36.8 ??C (98.2 ??F) (Oral)  - Resp 21  - Ht 170.2 cm (5' 7)  - Wt 72.6 kg (160 lb)  - SpO2 97%  - BMI 25.06 kg/m??    General appearance - alert, well appearing, and in no distress and oriented to person, place, and time   Mental status - alert, oriented to person, place, and time, normal mood, behavior, speech, dress, motor activity, and thought processes   Eyes - pupils equal and reactive, extraocular eye movements intact, sclera anicteric   Nose - normal and patent, no erythema, discharge or polyps   Mouth - not examined   Neck - not examined   Lymphatics - not examined   Pulmonary - CTAB, normal WOB   Cardiovascular- RRR   Gastrointestinal - Soft, nontender, nodistended, NABSx4   Neurological - alert, oriented, normal speech, no focal findings or movement disorder noted   Musculoskeletal - no joint tenderness, deformity or swelling   Extremities - peripheral pulses normal, no pedal edema, no clubbing or cyanosis   Skin - normal coloration and turgor, no rashes, no suspicious skin lesions noted     Test Results  Imaging: Radiology studies were personally reviewed    MRI Brain W Wo Contrast  Result Date: 07/04/2024   Impression: Peripherally enhancing mass within the left frontal lobe measuring up to 4.4 cm, concerning for malignancy. ++++++++++++++++++++ The findings of this study were discussed via telephone with DR. Tavien Mapp by Dr. Renda Code on 07/04/2024 7:22 AM. 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 Recommend Urologic consultation for cystoscopy.     CT Chest W Contrast  Result Date: 07/04/2024   Impression: 1.  No acute abnormality of the chest. 2.  No evidence of metastatic disease within the chest. 3.  Mildly enlarged pulmonary artery can be seen with pulmonary hypertension.     CT head WO contrast  Result Date: 07/03/2024   Impression: Vasogenic edema within the left frontal lobe concerning for a mass lesion. Recommend MRI brain with contrast for better characterization. ++++++++++++++++++++ The findings of this study were discussed via epic chat with DR. ANDRES ANIBAL LOPEZ by Dr. Prentice Kansky on 07/03/2024 8:22 PM. -----------------------------------------------            [1]   Past Medical History:  Diagnosis Date    Anemia     Cancer    (CMS-HCC)     clear cell adenocarcinoma of left kidney    Diabetes mellitus (CMS-HCC)     Diabetic nephropathy (CMS-HCC)     ESRD (end stage renal disease) on dialysis    (CMS-HCC)     ESRD on dialysis (CMS-HCC)     Gout     Hyperlipidemia     Hypertension    [2]   Past Surgical History:  Procedure Laterality Date  AV FISTULA PLACEMENT      PR COLONOSCOPY FLX DX W/COLLJ SPEC WHEN PFRMD N/A 04/30/2018    Procedure: COLONOSCOPY, FLEXIBLE, PROXIMAL TO SPLENIC FLEXURE; DIAGNOSTIC, W/WO COLLECTION SPECIMEN BY BRUSH OR WASH;  Surgeon: Dorn Lynwood Lauth, MD;  Location: [facility];  Service: Gastroenterology    PR COLONOSCOPY W/BIOPSY SINGLE/MULTIPLE N/A 04/30/2018    Procedure: COLONOSCOPY, FLEXIBLE, PROXIMAL TO SPLENIC FLEXURE; WITH BIOPSY, SINGLE OR MULTIPLE;  Surgeon: Dorn Lynwood Lauth, MD;  Location: [facility];  Service: Gastroenterology    PR COLSC FLX W/RMVL OF TUMOR POLYP LESION SNARE TQ N/A 04/30/2018    Procedure: COLONOSCOPY FLEX; W/REMOV TUMOR/LES BY SNARE;  Surgeon: Dorn Lynwood Lauth, MD;  Location: [facility];  Service: Gastroenterology    PR EXPLORATORY OF ABDOMEN Midline 04/14/2016    Procedure: EXPLORATORY LAPAROTOMY, EXPLORATORY CELIOTOMY WITH OR WITHOUT BIOPSY(S);  Surgeon: Marsa Sam Boss, MD;  Location: [facility];  Service: Transplant    PR EXPLORATORY OF ABDOMEN N/A 04/16/2016    Procedure: EXPLORATORY LAPAROTOMY, EXPLORATORY CELIOTOMY WITH OR WITHOUT BIOPSY(S);  Surgeon: Marsa Sam Boss, MD;  Location: MAIN OR [facility];  Service: Transplant    PR LAP, RADICAL NEPHRECTOMY Right 12/27/2016    Procedure: Robotic Xi Laparoscopy; Radical Nephrectomy (Incl Remove Gerota`S Fascia, Fatty Tissue, Reg Lymph Node, Adrenalectomy);  Surgeon: Clorinda Ole Chalk, MD;  Location: MAIN OR [facility];  Service: Urology    PR LIGATN ANGIOACCESS AV FISTULA Left 11/29/2022    Procedure: LIGATION OR BANDING OF ANGIOACCESS ARTERIOVENOUS FISTULA;  Surgeon: [place], Marsa Sam, MD;  Location: MAIN OR [place];  Service: Transplant    PR NEGATIVE PRESSURE WOUND THERAPY DME >50 SQ CM N/A 04/12/2016    Procedure: NEG PRESS WOUND TX (VAC ASSIST) INCL TOPICALS, PER SESSION, TSA GREATER THAN/= 50 CM SQUARED;  Surgeon: Marsa Sam Boss, MD;  Location: MAIN OR [place];  Service: Transplant    PR NEGATIVE PRESSURE WOUND THERAPY DME >50 SQ CM Bilateral 04/14/2016    Procedure: NEG PRESS WOUND TX (VAC ASSIST) INCL TOPICALS, PER SESSION, TSA GREATER THAN/= 50 CM SQUARED;  Surgeon: Marsa Sam Boss, MD;  Location: MAIN OR [place];  Service: Transplant    PR NEGATIVE PRESSURE WOUND THERAPY DME >50 SQ CM N/A 04/16/2016    Procedure: NEG PRESS WOUND TX (VAC ASSIST) INCL TOPICALS, PER SESSION, TSA GREATER THAN/= 50 CM SQUARED;  Surgeon: Marsa Sam Boss, MD;  Location: MAIN OR [facility];  Service: Transplant    PR NEPHRECTOMY, W/PART. URETECTOMY Bilateral 04/11/2016    Procedure: LAPAROSCOPY, SURGICAL, NEPHRECTOMY WITH TOTAL URETERECTOMY;  Surgeon: Marsa Sam Boss, MD;  Location: MAIN OR [facility];  Service: Transplant    PR REMV KIDNEY,W/RIB RESECTION Bilateral 04/11/2016    Procedure: NEPHRECTOMY, INCLUDING PARTIAL URETERECTOMY, ANY OPEN APPROACH INCLUDING RIB RESECTION;  Surgeon: Marsa Sam Boss, MD;  Location: MAIN OR [facility];  Service: Transplant    PR TRANSPLANT,PREP LIVING  RENAL GRAFT N/A 06/22/2019    Procedure: BACKBENCH STD PREP LIVING DONOR RENAL ALLGRFT (OPEN/LAPROSC) PRIOR TO TRANSPLANT, INC DISSECT/REM AS NECESS;  Surgeon: Marsa Sam Boss, MD;  Location: MAIN OR [facility];  Service: Transplant    PR TRANSPLANTATION OF KIDNEY N/A 06/22/2019    Procedure: RENAL ALLOTRANSPLANTATION, IMPLANTATION OF GRAFT; WITHOUT RECIPIENT NEPHRECTOMY;  Surgeon: Marsa Sam Boss, MD;  Location: MAIN OR [facility];  Service: Transplant    SPLENECTOMY     [3]   Family History  Problem Relation Age of Onset    Kidney disease Mother  Cancer Father     Kidney disease Brother    [4]   Social History  Socioeconomic History    Marital status: Married     Spouse name: Tristain Daily    Number of children: 1    Highest education level: High school graduate   Tobacco Use    Smoking status: Never    Smokeless tobacco: Never   Vaping Use    Vaping status: Never Used   Substance and Sexual Activity    Alcohol use: No    Drug use: No     Social Drivers of Health     Tobacco Use: Low Risk (07/03/2024)    Patient History     Smoking Tobacco Use: Never     Smokeless Tobacco Use: Never   Interpersonal Safety: Not At Risk (07/03/2024)    Interpersonal Safety     Unsafe Where You Currently Live: No     Physically Hurt by Anyone: No     Abused by Anyone: No   [5]    atorvastatin   40 mg Oral Daily    dexAMETHasone   4 mg Intravenous Q6H    mycophenolate   360 mg Oral BID    tacrolimus   5 mg Oral Daily   [6] [7]

## 2024-07-04 NOTE — H&P (Signed)
 Internal Medicine (MEDL) History & Physical    Assessment & Plan:   Frank Leach is a 73 y.o. 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  Patient with mild stable hyperkalemia to 5.1, mild hypercalcemia to 10.8 all other electrolytes within normal limits.  Laboratory findings not concerning for infection (white blood cell count, patient hemodynamically stable). CT head with vasogenic edema in the left frontal lobe concerning for mass.  MRI brain with peripherally enhancing mass within left frontal lobe measuring 4.4 cm, concerning for malignancy. 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 Patient was given 10 mg of dexamethasone  with improved weakness, and started on 4 milligrams every 6 hours.  Given high concern for malignancy, neurosurgery colleagues are consulted for potential need for further workup.  - Neurosurgery consulted, appreciate recommendations  - Continue dexamethasone  milligrams every 6 hours  - Daily neurologic exams  - Medical oncology consulted, appreciate recommendations  - Radiation oncology consulted appreciate recommendations  - Urology consulted as below, appreciate recommendations  - Palliative care consulted, appreciate connection to resources for psychosocial support  - Daily CBC      Bladder mass  Concern for new malignancy of unclear origin  CT abdomen pelvis on admission with bilobed mass or adjacent synchronous masses in the right anterior bladder wall, 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  Patient with no new or worsening abdominal symptoms, no gross hematuria or other urinary symptoms, no history of tobacco use.  On admission, kidney function at baseline, no concern for urinary retention or obstruction.  High concern for concern for urothelial carcinoma of the bladder, especially given new frontal brain mass as above, versus new lymphoma in the setting of chronic immunosuppression. Less likely recurrent RCC, as s/p bilateral nephrectomy.  - Urology consulted appreciate recommendations   - No urgent urologic intervention indicated   - Plan for outpatient cystoscopy and transurethral resection of bladder tumor/bladder biopsy for tissue diagnosis  - Daily BMP    History of renal cell carcinoma status post bilateral nephrectomy  Deceased donor kidney transplant  Hypertension  Bilateral nephrectomy in 07-20-16 and 07/20/2017 secondary to RCC. Deceased donor kidney transplant in 07/21/2019.  Followed closely by Dr. [name] with [facility] transplant nephrology.  On admission, creatinine level at 1.9 which is within his usual baseline oliguric urine output, and no other urinary symptoms.   - Transplant nephrology consulted, appreciate recommendations   - Hold mycophenolate    - Continue tacrolimus  5 mg daily, check tacrolimus  24-hour trough levels and target levels of 4 to 6 ng/mL   - Continue home amlodipine  10 mg daily   - Hold home olmesartan  40 mg daily    Type  2 diabetes mellitus  Hemoglobin A1c 6.8  - Hold home metformin  500 mg daily  - Daily BMP  - Add sliding scale insulin  if hyperglycemic, given steroids as above          The patient's presentation is complicated by the following clinically significant conditions requiring additional evaluation and treatment: - Hypercoagulable state requiring additional attention to DVT prophylaxis and treatment or chronic anticoagulation secondary to Malignancy   - Chronic kidney disease POA requiring further investigation, treatment, or monitoring   - Metastatic cancer POA requiring further investigation, treatment, or monitoring        Chronic Problems     Hyperlipidemia  Continue atorvastatin  40 mg daily      Checklist:  Diet: Regular Diet  DVT PPx: SCDs  Code Status: Full Code      Team Contact Information:   Primary Team: Internal Medicine (MEDL)  Primary Resident: Kip Brand, MD  Resident's Pager: [phone] (Gen MedL Intern - [place])    Chief Concern:   Frontal mass of brain      Subjective:   Frank Leach is a 73 y.o. 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  He could not stand up from the table for Thanksgiving dinner, and decided to come to the emergency department.  He has had no other symptoms, including shortness of breath, chest pain, dizziness, blurred vision, weight loss, fever chills, night sweats, constipation or diarrhea or abdominal pain.  He has no urinary symptoms, no hematuria, no suprapubic pain, no urinary hesitancy or urgency.     His last screening colonoscopy was completed at age 47.  He has never smoked before.  He lives with his wife [name].       Pertinent Surgical Hx  Deceased donor kidney transplant in 2020  Bilateral nephrectomy in 2017 and 2018      Pertinent Social Hx   Lives with his wife  Never used tobacco    Allergies  Patient has no known allergies.    I reviewed the Medication List. The current list is Accurate  Prior to Admission medications   Medication Dose, Route, Frequency   acetaminophen  (TYLENOL ) 500 MG tablet 500-1,000 mg, Oral, Every 6 hours PRN   allopurinol  (ZYLOPRIM ) 100 MG tablet 100 mg, Oral, Every evening   amlodipine  (NORVASC ) 10 MG tablet 10 mg, Oral, Daily (standard)   atorvastatin  (LIPITOR ) 40 MG tablet 40 mg, Oral, Daily (standard)   ENVARSUS  XR 4 mg Tb24 extended release tablet 4 mg, Oral, Daily (standard), Take in addition to TWO 1 mg tablets for a total daily dose of 6 mg.  Patient taking differently: Take 1 tablet (4 mg total) by mouth daily. Take in addition to ONE 1 mg tablets for a total daily dose of 5 mg.   latanoprost (XALATAN) 0.005 % ophthalmic solution 1 drop, Both Eyes, Nightly   magnesium oxide-Mg AA chelate (MAGNESIUM, AMINO ACID CHELATE,) 133 mg 1 tablet, Oral, 2 times a day   metFORMIN  (GLUCOPHAGE -XR) 500 MG 24 hr tablet 500 mg, Oral, Daily (RT)   mycophenolate  (MYFORTIC ) 180 MG EC tablet 360 mg, Oral, 2 times a day (standard)   olmesartan  (BENICAR ) 40 MG tablet 40 mg, Oral, Daily (standard)   tacrolimus  (ENVARSUS   XR) 1 mg Tb24 extended release tablet 1 tablet, Oral, Daily, Take one tablet daily with ONE 4 mg tablet for a total daily dose of 5mg        Librarian, Academic:  Mr. Haskin currently has decisional capacity for healthcare decision-making and is able to designate a surrogate healthcare decision maker. Mr. Veals designated healthcare decision maker(s) is/are Brevan Luberto NAME (the patient's spouse) as denoted by stated patient preference.    Objective:   Physical Exam:  Temp:  [36.7 ??C (98.1 ??F)-37.1 ??C (98.8 ??F)] 36.8 ??C (98.2 ??F)  Pulse:  [63-80] 74  SpO2 Pulse:  [69-75] 74  Resp:  [16-21] 21  BP: (123-179)/(70-83) 136/77  SpO2:  [94 %-100 %] 97 %    Gen: NAD, converses   Eyes: Sclera anicteric, EOMI grossly normal   HENT: Atraumatic, normocephalic  Neck: Trachea midline  Heart: RRR  Lungs: CTAB, no crackles or wheezes  Abdomen: Soft, NTND  Extremities: No edema  Neuro: 4/5 strength RLE extension and flexion at hip, knees, ankle, sensation intact bilateral lower extremities  Skin:  No rashes, lesions on clothed exam  Psych: Alert, oriented    Kip Brand, MD  PGY-1 Internal Medicine  [facility]

## 2024-07-04 NOTE — Consults (Signed)
 [facility] Consult Service - Initial Consult Note    Consulting Team: ED  Date: 07/04/24    Patient Name: Frank Leach  MRN: [id number]   Primary Cancer: Clear Cell Carcinoma  Reason(s) for Consult: Brain Mets    Current Pain Regimen: None  Current Steroid Regimen: Dex 4mg  q4h    Brief Assessment: Frank Leach is a 73 y.o. male with a PMHx of anemia, R kidney RCC s/p bilateral nephrectomy in 2017/2018, DDKT in 2020, ESRD on HD T2DM, diabetic neuropathy, gout, HLD, and HTN who presented to the ED with 1-2 weeks of difficulty with ambulation, gait instability, and right-sided upper and lower extremity weakness. MRI obtained on presentation demonstrated 4.4 cm enhancing mass in the left frontal lobe. Today, he is stable, with 4/5 strength in R upper and lower extremities. He denies headaches, nausea, vomiting, headaches and vision changes.    Recommendations:   Left Frontal Brain Mass: Given Frank Leach's history or clear cell renal carcinoma, kidney transplant, and immunosuppression, this frontal lobe lesion needs further workup to characterize whether this is a new primary, infection or metastasis. Based on imaging, the lesion is concerning for a new primary cancer. We suspect that radiation will likely be part of his treatment plan, but need to confirm cancer diagnosis with a biopsy.   Agree with Neurosurgery consult.   Recommend tissue sampling for diagnosis confirmation.  Radiation:   No indication for urgent radiation at this time.  Pathology:   Recommend obtaining tissue biopsy for further workup.  Imaging: Per primary team  Labs: Per primary team  Pain: Per primary team  Steroids: Agree with steroids, continue Dex 4 mg q4h    Assessment and plan were discussed with attending, Dr. Laurence.     Electronically Signed:  Allena Daring, MD  Resident Physician, Department of Radiation Oncology      Please page the Inpatient/Urgent Radiation Oncology Consult Team Monday-Friday, 8a-4p with questions. For after hours or weekend emergencies, please page the on call resident.    ----  Pertinent history, including data review:  07/03/24 CT Head w/o showed vasogenic edema within the L frontal lobe c/f lesion. No midline shift or hemorrhage noted.  07/04/24 MRI [place] showed peripherally enhancing mass within L frontal lobe involving subcortical white matter and cortex, extending from vertex to the superior wall of the L lateral ventricle measuring ~4.4 cm. Also with associated vasogenic edema and restricted diffusion. Small R mastoid effusion also noted. CT Chest showed no evidence of metastatic disease. CT A/P with bilobed masses in the R anterior bladder wall c/f malignant urothelial neoplasm..     Today, Frank Leach presents with his wife and daughters. He reports that his symptoms started about 1 week ago, when he noticed right sided weakness. He presented to the hospital after his wife noticed that he was dragging his right foot. Prior to this, he reports no issues and was able to perform his day to day ADL's. Since starting dexamethasone , he shares his symptoms feel like they have improved but he has been laying in bed since admission. He denies any headaches, nausea, vomiting, night sweats, headache and vision changes.     Prior Radiation Therapy:  No.    Pacemaker:  No.    Pregnancy status:  No; male patient  Collagen Vascular Disease:  No.      PAST MEDICAL HISTORY:  Past Medical History[1]    FAMILY HISTORY:  Family History[2]    SOCIAL HISTORY:   Social  History [3]    Allergies:  Allergies[4]    Pertinent Medications:  Current Medications[5]    Physical Examination  BP 136/77  - Pulse 74  - Temp 36.8 ??C (98.2 ??F) (Oral)  - Resp 21  - Ht 170.2 cm (5' 7)  - Wt 72.6 kg (160 lb)  - SpO2 97%  - BMI 25.06 kg/m??   ECOG: 1 = Restricted in physically strenuous activity but ambulatory and able to carry out work of a light or sedentary nature, e.g., light house work, office work  General: NAD, alert, appropriate affect  Pertinent Physical Exam:  HEENT: atraumatic, EOMI, nl range of motion, moist mucus membranes  Neuro: Oriented x3. Conversational. CN II-XII grossly intact.  Extremity: Mild weakness in the R upper and lower extremity, 4/5 strength. No pain to palpitation.Sensory intact in all 4 extremities.               [1]   Past Medical History:  Diagnosis Date    Anemia     Cancer    (CMS-HCC)     clear cell adenocarcinoma of left kidney    Diabetes mellitus (CMS-HCC)     Diabetic nephropathy (CMS-HCC)     ESRD (end stage renal disease) on dialysis    (CMS-HCC)     ESRD on dialysis (CMS-HCC)     Gout     Hyperlipidemia     Hypertension    [2]   Family History  Problem Relation Age of Onset    Kidney disease Mother     Cancer Father     Kidney disease Brother    [3]   Social History  Socioeconomic History    Marital status: Married     Spouse name: Yvonne Petite    Number of children: 1    Highest education level: High school graduate   Tobacco Use    Smoking status: Never    Smokeless tobacco: Never   Vaping Use    Vaping status: Never Used   Substance and Sexual Activity    Alcohol use: No    Drug use: No     Social Drivers of Health     Tobacco Use: Low Risk (07/03/2024)    Patient History     Smoking Tobacco Use: Never     Smokeless Tobacco Use: Never   Interpersonal Safety: Not At Risk (07/03/2024)    Interpersonal Safety     Unsafe Where You Currently Live: No     Physically Hurt by Anyone: No     Abused by Anyone: No   [4] No Known Allergies  [5]   Current Facility-Administered Medications   Medication Dose Route Frequency Provider Last Rate Last Admin    acetaminophen  (TYLENOL ) tablet 650 mg  650 mg Oral Q6H PRN Debarah Redell RAMAN, MD        aluminum-magnesium hydroxide-simethicone (MAALOX MAX) 80-80-8 mg/mL oral suspension  30 mL Oral Q4H PRN Debarah Redell RAMAN, MD        atorvastatin  (LIPITOR ) tablet 40 mg  40 mg Oral Daily Puscheck, Jesse A, MD   40 mg at 07/04/24 0315    dexAMETHasone  (DECADRON ) 4 mg/mL injection 4 mg  4 mg Intravenous Q6H Puscheck, Josefa LABOR, MD   4 mg at 07/04/24 1410    guaiFENesin (ROBITUSSIN) oral syrup  200 mg Oral Q4H PRN Debarah Redell RAMAN, MD        melatonin tablet 3 mg  3 mg Oral Nightly PRN Debarah Redell RAMAN, MD  senna (SENOKOT) tablet 2 tablet  2 tablet Oral Nightly PRN Debarah Redell RAMAN, MD        tacrolimus  (ENVARSUS  XR) extended release tablet 5 mg  5 mg Oral Daily Puscheck, Jesse A, MD   5 mg at 07/04/24 9241     Current Outpatient Medications   Medication Sig Dispense Refill    acetaminophen  (TYLENOL ) 500 MG tablet Take 1-2 tablets (500-1,000 mg total) by mouth every six (6) hours as needed for pain or fever (> 38C). 100 tablet 0    allopurinol  (ZYLOPRIM ) 100 MG tablet Take 1 tablet (100 mg total) by mouth every evening. 90 tablet 3    amlodipine  (NORVASC ) 10 MG tablet Take 1 tablet (10 mg total) by mouth daily. 90 tablet 3    atorvastatin  (LIPITOR ) 40 MG tablet Take 1 tablet (40 mg total) by mouth daily. 90 tablet 3    ENVARSUS  XR 4 mg Tb24 extended release tablet Take 1 tablet (4 mg total) by mouth daily. Take in addition to TWO 1 mg tablets for a total daily dose of 6 mg. (Patient taking differently: Take 1 tablet (4 mg total) by mouth daily. Take in addition to ONE 1 mg tablets for a total daily dose of 5 mg.) 60 tablet 11    latanoprost (XALATAN) 0.005 % ophthalmic solution Administer 1 drop to both eyes nightly.      magnesium oxide-Mg AA chelate (MAGNESIUM, AMINO ACID CHELATE,) 133 mg Take 1 tablet by mouth two (2) times a day. 100 tablet 11    metFORMIN  (GLUCOPHAGE -XR) 500 MG 24 hr tablet Take 1 tablet (500 mg total) by mouth in the morning. 90 tablet 3    mycophenolate  (MYFORTIC ) 180 MG EC tablet Take 2 tablets (360 mg total) by mouth two (2) times a day. 180 tablet 3    olmesartan  (BENICAR ) 40 MG tablet Take 1 tablet (40 mg total) by mouth daily. 90 tablet 3    tacrolimus  (ENVARSUS  XR) 1 mg Tb24 extended release tablet Take 1 tablet (1 mg total) by mouth in the morning. Take one tablet daily with ONE 4 mg tablet for a total daily dose of 5mg .

## 2024-07-04 NOTE — Hospital Course (Addendum)
 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    Items for outpatient follow up  [ ]  Steroid induced hyperglycemia- ensure normalization of blood sugars after dexamethasone  done, though will be continued on pred 5 mg daily per transplant nephrology. Consider addition of mealtime insulin   [ ]  Ensure continued stability or R sided weakness- on day of discharge pt still with L tongue deviation, 4/5 weakness in dorsiflexion on the R foot and weakness of R leg extension. Mild delayed speech noted.   [ ]  Ensure follow up scheduled with medical oncology, urology, radiation oncology, and palliative care    Left frontal lobe brain mass  Right sided weakness  Concern for new malignancy of unclear origin  Patient presented with 1 week of progressive right sided weakness, with no other neurologic issues or deficits, such as headache, dizziness, double vision, or confusion. On admission, patient with 3/5 strength in right lower extremity, left tongue deviation, and slight speech delay, otherwise neurologically intact.  Laboratory findings not concerning for infection (white blood cell count, patient hemodynamically stable). CT head with vasogenic edema in the left frontal lobe concerning for mass.  MRI brain with peripherally enhancing mass within left frontal lobe measuring 4.4 cm, concerning for malignancy. Patient was started on IV Decadron  on admission, and transitioned to PO decadron  taper in consultation with medical oncology. With steroids, patient had improvement in neurologic symptoms. Medical oncology was consulted and agreed with neurosurgical and urology workup as outlined. Radiation oncology was consulted and agreed with other workup, with no indication for radiation at this time. Neurosurgery was consulted, who agree lesion is metastatic versus new primary. Less likely autoimmune given extent of necrosis and less likely abscess given lack of infectious symptoms. Case was discussed with the neurosurgical oncology specialists. Craniotomy with resection was scheduled for Friday December 5th. Patient was discharged with the remainder of his PO dexamethasone  taper (set to end December 13th), and transplant nephrology advised starting prednisone  5 mg daily after the end of dexamethasone . Patient was referred to Undiagnosed Cancer clinic, urology, radiation oncology, and palliative care on discharge. Patient was discharged with home health services.    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  Patient with no new or worsening abdominal symptoms, no gross hematuria or other urinary symptoms, no history of tobacco use.  On admission, kidney function at baseline, no concern for urinary retention or obstruction.  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 Urology was consulted and agree with concern for malignancy, advised no acute intervention this hospitalization but referred for outpatient cystoscopy and tissue diagnosis including potential TURT. During hospitalization, patient with stable creatinine, adequate urine output, no hematuria or clots. Referred to urology on discharge.     History of renal cell carcinoma status post bilateral nephrectomy  Deceased donor kidney transplant  Hypertension  Bilateral nephrectomy in 2016-08-06 and 08-06-2017 secondary to RCC. Deceased donor kidney transplant in August 07, 2019. On Envarsus  5 mg daily, mycophenalate 360 mg BID, amlodipine  10 mg daily and olmesartan  40 mg daily. Followed closely by Dr.  Kleman with [facility] transplant nephrology.  On admission, creatinine level at 1.9. which is within his usual baseline oliguric urine output, and no other urinary symptoms. Transplant nephrology was consulted, and advised to hold mycophenalate in setting of new malignancy, hold olmesartan  and can continue home amlodipine  if needed for BP control. Tac trough levels were monitored and tacrolimus  was continued as prescribed. NSGY queried potential of holding tacrolimus  post operatively for wound healing, but transplant nephrology advised to continue. Renal function remained within baseline throughout hospitalization. Patient discharged on Envarsus  5mg  daily. And advised to hold mycophenalate, amlodipine , and olmesartan  until follow up with Dr. [name] in late December.    Type 2 diabetes mellitus  Steroid induced hyperglycemia  Hemoglobin A1c 6.8. Held home metformin , restarted on discharge. Blood sugars fasting in 200's, likely steroid induced. Hyperglycemia managed with correctional insulin .     Hyperlipidemia  Continued atorvastatin  40 mg daily

## 2024-07-04 NOTE — Consults (Signed)
 UROLOGY CONSULT NOTE    Requesting Attending Physician:  Ejaz Arshad Janjua, DO  Service Requesting Consult:  [facility] ([facility])  Service Providing Consult: [facility]  Consulting Attending: Dr. Alm Sprinkle    Assessment:  Patient is a 73 y.o. 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  He presented to the ED with RLE weakness with head MRI notable for peripherally enhancing mass within the left frontal lobe measuring up to 4 cm, concerning for malignancy.  Urology was consulted for incidentally found right anterior bladder wall mass on CT imaging concerning for malignancy.    CT AP with right anterior bladder wall mass. CT chest with contrast without evidence of metastatic disease in the chest.  MRI brain with peripherally enhancing mass within the left frontal lobe measuring up to 4.4 cm.  He has not had a history of hematuria, urinary frequency, or tobacco use. Hgb 12.7. Cr 1.79 (baseline 1.6). Neurosurgery, Radiation Oncology, and Medical Oncology have also been consulted for his brain mass.    At this time, no urologic intervention is indicated.  Routine evaluation for bladder mass would include cystoscopy and transurethral resection of bladder tumor/bladder biopsy for tissue diagnosis.  However, he is currently admitted for workup of newly diagnosed frontal lobe mass and we would defer his care at this moment to the Neurosurgery/Medical Oncology/Radiation Oncology teams.  From a urologic perspective, it is important and he will ultimately require workup for his bladder mass.  This may be worked up in the outpatient setting after workup/potential treatment for his brain mass.           MRI Brain W Wo Contrast   Final Result   Peripherally enhancing mass within the left frontal lobe measuring up to 4.4 cm, concerning for malignancy.      ++++++++++++++++++++      The findings of this study were discussed via telephone with DR. Tavien Mapp by Dr. Renda Code on 07/04/2024 7:22 AM.      -----------------------------------------------      CT Chest W Contrast   Final Result      1.  No acute abnormality of the chest.      2.  No evidence of metastatic disease within the chest.    3.  Mildly enlarged pulmonary artery can be seen with pulmonary hypertension.                        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 Recommend Urologic consultation for cystoscopy.         CT head WO contrast   Final Result      Vasogenic edema within the left frontal lobe concerning for a mass lesion. Recommend MRI brain with contrast for better characterization.      ++++++++++++++++++++      The findings of this study were discussed via epic chat with DR. ANDRES ANIBAL LOPEZ by Dr. Prentice Kansky on 07/03/2024 8:22 PM.      -----------------------------------------------              Recommendations:  At this time, no urologic intervention is indicated.    Given incidentally-found bladder mass in the setting of his current workup for new frontal  lobe mass, defer care to Neurosurgery/MedOnc/RadOnc teams.  He will definitely require cystoscopy and tissue diagnosis for further evaluation, and this can be done in the outpatient setting after evaluation and potential treatment for brain mass.   If patient develops hematuria with clot retention, and/or hematuria with concomitant acute blood loss anemia, please page the urology consult pager.  Please page the Urology consult pager closer to discharge to arrange workup for his bladder mass.     Thank you for this consult. Please page [phone] with any questions or concerns.    History of Present Illness:   Frank Leach is seen in consultation for incidentally found bladder mass at the request of Ejaz Arshad Janjua, DO on the [facility]     PMH renal cell carcinoma status post bilateral nephrectomy (left 04/2016, right 12/2016), DDKT 06/2019, anemia, T2DM, gout, HLD, HTN, diabetic neuropathy.  Currently followed outpatient by [facility] transplant team.  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 He has not had a history of hematuria, urinary frequency, or tobacco use.     Past Medical History:  Past Medical History[1]    Past Surgical History:   Past Surgical History[2]    Medication:  Current Medications[3]    Allergies:  Allergies[4]    Social History:  Short Social History[5]    Family History:  Family History[6]    Review of Systems:  10 systems were reviewed and are negative except as noted specifically in the HPI.    Objective:     Intake/Output last 3 shifts:  I/O last 3 completed shifts:  In: -   Out: 375 [Urine:375]  Vital signs in last 24 hours:  BP 153/85  - Pulse 81  - Temp 36.3 ??C (97.3 ??F) (Oral)  - Resp 25  - Ht 170.2 cm (5' 7)  - Wt 72.6 kg (160 lb)  - SpO2 97%  - BMI 25.06 kg/m??     Physical Exam:  General:  No acute distress, well appearing and well nourished  HEENT: Normocephalic, atraumatic, pupils equal and round, sclera anicteric  Neck  Trachea midline, symmetrical  Lungs:   Normal work of breathing on room air  Cardiac: Regular rate  Abdomen: Non tender, soft, non distended.  GU:   CVA tenderness. Voiding spontaneously  Extremities: Warm and well perfused  Neuro:             Alert and oriented, strength and sensation grossly normal      Most Recent Labs:  Recent Labs     Units 07/03/24  2054   WBC 10*9/L 7.8   RBC 10*12/L 4.36   HGB g/dL 87.2*   HCT % 61.4*   MCV fL 88.3   MCH pg 29.0   MCHC g/dL 67.0   RDW % 84.3*   PLT 10*9/L 234   MPV fL 8.4     Recent Labs     Units 07/03/24  2054   NA mmol/L 145   K mmol/L 5.1*   CL mmol/L 110*   CO2 mmol/L 21.8   BUN mg/dL 28*   CREATININE mg/dL 8.20*   GLU mg/dL 858     Recent Labs     Units 07/03/24  2054   ALT U/L 17   AST U/L 20   ALKPHOS U/L 78   ALBUMIN g/dL 4.1   PROT g/dL 7.2  BILITOT mg/dL 0.2*     No results for input(s): INR, APTT in the last 168 hours.    Microbiology Data:  Blood Culture, Routine   Date Value Ref Range Status   06/22/2019 No Growth at 5 days  Final   06/22/2019 No Growth at 5 days  Final   04/15/2016 No Growth at 5 days  Final   04/15/2016 No Growth at 5 days  Final     Urine Culture, Comprehensive   Date Value Ref Range Status   06/11/2023 NO GROWTH  Final   02/14/2023 NO GROWTH  Final   10/13/2022 NO GROWTH  Final   06/02/2022 NO GROWTH  Final   01/13/2022 NO GROWTH  Final   11/07/2021 NO GROWTH  Final   09/28/2021 NO GROWTH  Final     No results found for: ANACX  Most recent Urinalysis:  No results for input(s): LEUKOCYTESUR, NITRITE, RBCUA, WBCUA, SQUEPIU, BACTERIA in the last 168 hours.   Urinalysis History:  Leukocyte Esterase, UA   Date Value Ref Range Status   06/11/2023 Negative Negative Final   02/14/2023 Negative Negative Final   10/13/2022 Negative Negative Final   06/02/2022 Negative Negative Final   01/13/2022 Negative Negative Final   11/07/2021 Negative Negative Final   09/28/2021 Negative Negative Final   12/13/2011 NEGATIVE NEGATIVE Final   07/06/2009 NEGATIVE NEGATIVE Final   11/25/2005 NEGATIVE NEGATIVE Final     Nitrite, UA   Date Value Ref Range Status   06/11/2023 Negative Negative Final   02/14/2023 Negative Negative Final   10/13/2022 Negative Negative Final   06/02/2022 Negative Negative Final   01/13/2022 Negative Negative Final   11/07/2021 Negative Negative Final   09/28/2021 Negative Negative Final   12/13/2011 NEGATIVE NEGATIVE Final   07/06/2009 NEGATIVE NEGATIVE Final   11/25/2005 NEGATIVE NEGATIVE Final     RBC, UA   Date Value Ref Range Status   06/11/2023 <1 <=3 /HPF Final   02/14/2023 1 <3 /HPF Final   10/13/2022 <1 <3 /HPF Final   06/02/2022 <1 <3 /HPF Final   01/13/2022 <1 <3 /HPF Final   11/07/2021 1 <=3 /HPF Final   09/28/2021 <1 <3 /HPF Final     WBC, UA   Date Value Ref Range Status   06/11/2023 <1 <=2 /HPF Final   02/14/2023 1 <2 /HPF Final   10/13/2022 <1 <2 /HPF Final   06/02/2022 1 <2 /HPF Final   01/13/2022 1 <2 /HPF Final   11/07/2021 1 <=2 /HPF Final   09/28/2021 2 (H) <2 /HPF Final     Squam Epithel, UA   Date Value Ref Range Status   06/11/2023 <1 0 - 5 /HPF Final   02/14/2023 1 0 - 5 /HPF Final   10/13/2022 <1 0 - 5 /HPF Final   06/02/2022 <1 0 - 5 /HPF Final   01/13/2022 <1 0 - 5 /HPF Final   11/07/2021 <1 0 - 5 /HPF Final   09/28/2021 <1 0 - 5 /HPF Final   07/06/2009 <1 /HPF Final   11/25/2005 <1 /HPF Final     Bacteria, UA   Date Value Ref Range Status   06/11/2023 None Seen None Seen /HPF Final   02/14/2023 None Seen None Seen /HPF Final   10/13/2022 None Seen None Seen /HPF Final   06/02/2022 Rare (A) None Seen /HPF Final   01/13/2022 None Seen None Seen /HPF Final   11/07/2021 None Seen None Seen /HPF Final   09/28/2021 Rare (A)  None Seen /HPF Final        Imaging:  ECG 12 Lead  Result Date: 07/04/2024  SINUS RHYTHM WITH 1ST DEGREE AV BLOCK SEPTAL INFARCT  , AGE UNDETERMINED ABNORMAL ECG WHEN COMPARED WITH ECG OF 22-Jun-2019 17:42, PR INTERVAL HAS INCREASED SEPTAL INFARCT  IS NOW PRESENT NON-SPECIFIC CHANGE IN ST SEGMENT IN INFERIOR LEADS QT HAS SHORTENED Confirmed by Von Shawl [phone]) on 07/04/2024 8:50:00 AM    MRI Brain W Wo Contrast  Result Date: 07/04/2024  EXAM: Magnetic resonance imaging, brain, without and with contrast material. ACCESSION: [id number] UN     CLINICAL INDICATION: 73 years old Male with mass left frontal region further eval      COMPARISON: CT head 07/03/2024.     TECHNIQUE: Multiplanar, multisequence MR imaging of the brain was performed without and with I.V. contrast.     FINDINGS:  There are scattered foci of signal abnormality within the periventricular and deep white matter.  These are nonspecific but commonly seen with small vessel ischemic changes. Small focus of SWI dropout in the left cerebellum, likely chronic microhemorrhage. Generalized cerebral volume loss with ex vacuo dilatation of the ventricles. There is no midline shift. No extra-axial fluid collection. No evidence of intracranial hemorrhage. No diffusion weighted signal abnormality to suggest acute infarct.     Heterogeneously enhancing mass within the left frontal lobe involving subcortical white matter and cortex, extending from the vertex to the superior wall of the left lateral ventricle measuring approximately 3.0 x 1.7 x 4.4 cm (14:37, 1033:111) with associated vasogenic edema and restricted diffusion. The lesion demonstrates avid peripheral enhancement with milder degrees of central enhancement.     Small right mastoid effusion.         Peripherally enhancing mass within the left frontal lobe measuring up to 4.4 cm, concerning for malignancy.     ++++++++++++++++++++     The findings of this study were discussed via telephone with DR. Tavien Mapp by Dr. Renda Code on 07/04/2024 7:22 AM.     -----------------------------------------------    CT Abdomen Pelvis W IV Contrast Only  Result Date: 07/04/2024  EXAM: CT ABDOMEN PELVIS W CONTRAST ACCESSION: [id number] UN REPORT DATE: 07/04/2024 2:40 AM CLINICAL INDICATION: 73 years old with brain tumor eval for sites of malignancy      COMPARISON: CT abdomen/pelvis from 06/28/2022.     TECHNIQUE: A helical CT scan of the abdomen and pelvis was obtained following IV contrast from the lung bases through the pubic symphysis. Images were reconstructed in the axial plane. Coronal and sagittal reformatted images were also provided for further evaluation.         FINDINGS:     LOWER CHEST: Please see the contemporaneous CT chest with contrast for characterization of findings above the diaphragm.     LIVER: Normal liver contour.  No focal liver lesions.     BILIARY: The gallbladder is normal in appearance. No biliary ductal dilatation.      SPLEEN: Splenectomy. Similar splenosis.     PANCREAS: Normal pancreatic contour.  No focal lesions.  No ductal dilation.     ADRENAL GLANDS: Normal appearance of the adrenal glands.     KIDNEYS/URETERS: Bilateral nephrectomies with similar size and morphology of the nodular soft tissue within the right nephrectomy bed, though with decreased internal macroscopic fat.     Transplant kidney within the left lower quadrant with no hydronephrosis or solid renal mass.     BLADDER: Hyperattenuating bilobed lesion or adjacent lesions along the right anterior  bladder wall with the larger upper lobe/mass measuring approximately 2.0 x 1.4 cm (2:133).     REPRODUCTIVE ORGANS: Unremarkable.     GI TRACT: No findings of bowel obstruction or acute inflammation. Colonic diverticulosis without evidence of diverticulitis. Normal appendix.     PERITONEUM, RETROPERITONEUM AND MESENTERY: No free air.  No ascites.  No fluid collection.     LYMPH NODES: No adenopathy.     VESSELS: Hepatic and portal veins are patent. The aorta is normal in caliber with mild calcifications throughout the aorta.     BONES and SOFT TISSUES: Atrophy of the bilateral external oblique muscles.     Unchanged sclerotic lesion within the L3 vertebra. No focal soft tissue lesions.             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 Recommend Urologic consultation for cystoscopy.         CT Chest W Contrast  Result Date: 07/04/2024  EXAM: CT CHEST W CONTRAST ACCESSION: [id number] UN REPORT DATE: 07/04/2024 2:32 AM     CLINICAL INDICATION: brain tumor eval for sites of malignancy     TECHNIQUE: Contiguous axial images were reconstructed through the chest following a single breath hold helical acquisition during the administration of intravenous contrast material. Images were reformatted in the axial, coronal, and sagittal planes. MIP slabs were also constructed.     COMPARISON: CT chest from 06/28/2024.     FINDINGS: There is minimal contrast within the vasculature.     LUNGS/AIRWAYS/PLEURA: Trachea and large airways are patent. Mild bibasilar atelectasis and/or scarring. No pleural effusion or pneumothorax.     MEDIASTINUM/THORACIC INLET: No enlarged supraclavicular or intrathoracic lymph nodes. No thyroid abnormality.     HEART/VASCULATURE: Cardiac chambers are normal in size. Moderate coronary artery calcifications. No pericardial effusion. Aorta is normal in caliber. Main pulmonary artery is mildly enlarged, measuring 3.2 cm.     DIAPHRAGM/UPPER ABDOMEN: Diaphragm intact and normally positioned. Abdomen findings reported separately on the contemporaneous CT abdomen/pelvis.     CHEST WALL/BONES: Bilateral gynecomastia. No enlarged axillary lymph nodes. Mild degenerative changes of the thoracic spine. Intraosseous cyst/ganglion along the rotator cuff footprint in the proximal right humerus.     DEVICES: None.                 1.  No acute abnormality of the chest.     2.  No evidence of metastatic disease within the chest. 3.  Mildly enlarged pulmonary artery can be seen with pulmonary hypertension.                             CT head WO contrast  Result Date: 07/03/2024  EXAM: Computed tomography, head or brain without contrast material. ACCESSION: [id number] UN     CLINICAL INDICATION: 73 years old Male with Concern for RUE, RLE weakness 2 weeks      COMPARISON: CT head from 11/27/2022     TECHNIQUE: Axial CT images of the head from skull base to vertex without contrast.     FINDINGS: Vasogenic edema within the left frontal lobe with mass effect on the frontal horn of the left lateral ventricle. No midline shift. Basal cisterns are preserved. No evidence of acute infarct.  The sinuses are pneumatized. Calcification in the left genu of the corpus callosum.     No intracranial hemorrhage or skull fractures.     Calcifications of the  bilateral cavernous carotids.                 Vasogenic edema within the left frontal lobe concerning for a mass lesion. Recommend MRI brain with contrast for better characterization.     ++++++++++++++++++++     The findings of this study were discussed via epic chat with DR. ANDRES ANIBAL LOPEZ by Dr. Prentice Kansky on 07/03/2024 8:22 PM.     -----------------------------------------------                    [1]   Past Medical History:  Diagnosis Date    Anemia     Cancer    (CMS-HCC)     clear cell adenocarcinoma of left kidney    Diabetes mellitus (CMS-HCC)     Diabetic nephropathy (CMS-HCC)     ESRD (end stage renal disease) on dialysis    (CMS-HCC)     ESRD on dialysis (CMS-HCC)     Gout     Hyperlipidemia     Hypertension    [2]   Past Surgical History:  Procedure Laterality Date    AV FISTULA PLACEMENT      PR COLONOSCOPY FLX DX W/COLLJ SPEC WHEN PFRMD N/A 04/30/2018    Procedure: COLONOSCOPY, FLEXIBLE, PROXIMAL TO SPLENIC FLEXURE; DIAGNOSTIC, W/WO COLLECTION SPECIMEN BY BRUSH OR WASH;  Surgeon: Dorn Lynwood Lauth, MD;  Location: [facility];  Service: Gastroenterology    PR COLONOSCOPY W/BIOPSY SINGLE/MULTIPLE N/A 04/30/2018    Procedure: COLONOSCOPY, FLEXIBLE, PROXIMAL TO SPLENIC FLEXURE; WITH BIOPSY, SINGLE OR MULTIPLE;  Surgeon: Dorn Lynwood Lauth, MD;  Location: [facility];  Service: Gastroenterology    PR COLSC FLX W/RMVL OF TUMOR POLYP LESION SNARE TQ N/A 04/30/2018    Procedure: COLONOSCOPY FLEX; W/REMOV TUMOR/LES BY SNARE;  Surgeon: Dorn Lynwood Lauth, MD;  Location: [facility];  Service: Gastroenterology    PR EXPLORATORY OF ABDOMEN Midline 04/14/2016    Procedure: EXPLORATORY LAPAROTOMY, EXPLORATORY CELIOTOMY WITH OR WITHOUT BIOPSY(S);  Surgeon: Marsa Sam Boss, MD;  Location: MAIN OR [facility];  Service: Transplant    PR EXPLORATORY OF ABDOMEN N/A 04/16/2016    Procedure: EXPLORATORY LAPAROTOMY, EXPLORATORY CELIOTOMY WITH OR WITHOUT BIOPSY(S);  Surgeon: Marsa Sam Boss, MD;  Location: MAIN OR [facility];  Service: Transplant    PR LAP, RADICAL NEPHRECTOMY Right 12/27/2016    Procedure: Robotic Xi Laparoscopy; Radical Nephrectomy (Incl Remove Gerota`S Fascia, Fatty Tissue, Reg Lymph Node, Adrenalectomy);  Surgeon: Clorinda Ole Chalk, MD;  Location: MAIN OR [facility];  Service: Urology    PR LIGATN ANGIOACCESS AV FISTULA Left 11/29/2022    Procedure: LIGATION OR BANDING OF ANGIOACCESS ARTERIOVENOUS FISTULA;  Surgeon: [place], Marsa Sam, MD;  Location: MAIN OR [place];  Service: Transplant    PR NEGATIVE PRESSURE WOUND THERAPY DME >50 SQ CM N/A 04/12/2016    Procedure: NEG PRESS WOUND TX (VAC ASSIST) INCL TOPICALS, PER SESSION, TSA GREATER THAN/= 50 CM SQUARED;  Surgeon: Marsa Sam Boss, MD;  Location: MAIN OR [place];  Service: Transplant    PR NEGATIVE PRESSURE WOUND THERAPY DME >50 SQ CM Bilateral 04/14/2016    Procedure: NEG PRESS WOUND TX (VAC ASSIST) INCL TOPICALS, PER SESSION, TSA GREATER THAN/= 50 CM SQUARED;  Surgeon: Marsa Sam Boss, MD;  Location: MAIN OR [place];  Service: Transplant    PR NEGATIVE PRESSURE WOUND THERAPY DME >50 SQ CM N/A 04/16/2016    Procedure: NEG PRESS WOUND TX (VAC ASSIST) INCL TOPICALS, PER SESSION, TSA GREATER  THAN/= 50 CM SQUARED;  Surgeon: Marsa Sam Boss, MD;  Location: MAIN OR [facility];  Service: Transplant    PR NEPHRECTOMY, W/PART. URETECTOMY Bilateral 04/11/2016    Procedure: LAPAROSCOPY, SURGICAL, NEPHRECTOMY WITH TOTAL URETERECTOMY;  Surgeon: Marsa Sam Boss, MD;  Location: MAIN OR [facility];  Service: Transplant    PR REMV KIDNEY,W/RIB RESECTION Bilateral 04/11/2016    Procedure: NEPHRECTOMY, INCLUDING PARTIAL URETERECTOMY, ANY OPEN APPROACH INCLUDING RIB RESECTION;  Surgeon: Marsa Sam Boss, MD;  Location: MAIN OR [facility];  Service: Transplant    PR TRANSPLANT,PREP LIVING  RENAL GRAFT N/A 06/22/2019    Procedure: BACKBENCH STD PREP LIVING DONOR RENAL ALLGRFT (OPEN/LAPROSC) PRIOR TO TRANSPLANT, INC DISSECT/REM AS NECESS;  Surgeon: Marsa Sam Boss, MD;  Location: MAIN OR [facility];  Service: Transplant    PR TRANSPLANTATION OF KIDNEY N/A 06/22/2019    Procedure: RENAL ALLOTRANSPLANTATION, IMPLANTATION OF GRAFT; WITHOUT RECIPIENT NEPHRECTOMY;  Surgeon: Marsa Sam Boss, MD;  Location: MAIN OR [facility];  Service: Transplant    SPLENECTOMY     [3]   Current Facility-Administered Medications   Medication Dose Route Frequency Provider Last Rate Last Admin    acetaminophen  (TYLENOL ) tablet 650 mg  650 mg Oral Q6H PRN Debarah Redell RAMAN, MD        aluminum-magnesium hydroxide-simethicone (MAALOX MAX) 80-80-8 mg/mL oral suspension  30 mL Oral Q4H PRN Debarah Redell RAMAN, MD        atorvastatin  (LIPITOR ) tablet 40 mg  40 mg Oral Daily Puscheck, Jesse A, MD   40 mg at 07/04/24 0315    dexAMETHasone  (DECADRON ) 4 mg/mL injection 4 mg  4 mg Intravenous Q6H Anniece Siskin, MD        guaiFENesin (ROBITUSSIN) oral syrup  200 mg Oral Q4H PRN Debarah Redell RAMAN, MD        melatonin tablet 3 mg  3 mg Oral Nightly PRN Debarah Redell RAMAN, MD        senna NALANI) tablet 2 tablet  2 tablet Oral Nightly PRN Debarah Redell RAMAN, MD        tacrolimus  (ENVARSUS  XR) extended release tablet 5 mg  5 mg Oral Daily Puscheck, Jesse A, MD   5 mg at 07/04/24 9241     Current Outpatient Medications   Medication Sig Dispense Refill    acetaminophen  (TYLENOL ) 500 MG tablet Take 1-2 tablets (500-1,000 mg total) by mouth every six (6) hours as needed for pain or fever (> 38C). 100 tablet 0    allopurinol  (ZYLOPRIM ) 100 MG tablet Take 1 tablet (100 mg total) by mouth every evening. 90 tablet 3    amlodipine  (NORVASC ) 10 MG tablet Take 1 tablet (10 mg total) by mouth daily. 90 tablet 3    atorvastatin  (LIPITOR ) 40 MG tablet Take 1 tablet (40 mg total) by mouth daily. 90 tablet 3    ENVARSUS  XR 4 mg Tb24 extended release tablet Take 1 tablet (4 mg total) by mouth daily. Take in addition to TWO 1 mg tablets for a total daily dose of 6 mg. (Patient taking differently: Take 1 tablet (4 mg total) by mouth daily. Take in addition to ONE 1 mg tablets for a total daily dose of 5 mg.) 60 tablet 11    latanoprost (XALATAN) 0.005 % ophthalmic solution Administer 1 drop to both eyes nightly.      magnesium oxide-Mg AA chelate (MAGNESIUM, AMINO ACID CHELATE,) 133 mg Take 1 tablet by mouth two (2) times a day. 100 tablet 11    metFORMIN  (GLUCOPHAGE -XR) 500 MG 24  hr tablet Take 1 tablet (500 mg total) by mouth in the morning. 90 tablet 3    mycophenolate  (MYFORTIC ) 180 MG EC tablet Take 2 tablets (360 mg total) by mouth two (2) times a day. 180 tablet 3    olmesartan  (BENICAR ) 40 MG tablet Take 1 tablet (40 mg total) by mouth daily. 90 tablet 3    tacrolimus  (ENVARSUS  XR) 1 mg Tb24 extended release tablet Take 1 tablet (1 mg total) by mouth in the morning. Take one tablet daily with ONE 4 mg tablet for a total daily dose of 5mg .     [4] No Known Allergies  [5]   Social History  Tobacco Use    Smoking status: Never    Smokeless tobacco: Never   Vaping Use    Vaping status: Never Used   Substance Use Topics    Alcohol use: No    Drug use: No   [6]   Family History  Problem Relation Age of Onset    Kidney disease Mother     Cancer Father     Kidney disease Brother

## 2024-07-04 NOTE — ED Progress Note (Signed)
 ED Handoff  Patient Summary/Action List: See ED course    Illness Severity: stable    Situation Awareness (Contingency Planning): Anticipate admission.     Progress    ED Course as of 07/04/24 1809   Fri Jul 04, 2024   0710 I received sign out from the previous ED provider Dr. Suzette.     This is a 73 y.o. y/o male w/ hx of Anemia, Cancer    (CMS-HCC), Diabetes mellitus (CMS-HCC), Diabetic nephropathy (CMS-HCC), ESRD (end stage renal disease) on dialysis    (CMS-HCC), ESRD on dialysis (CMS-HCC), Gout, Hyperlipidemia, and Hypertension that presented w/ right sided weakness. 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 Patient has received dexamethasone . Case was discussed with neurosurgery and radiation oncology. Pending MRI brain and neurosurgery recommendations.     9274 Received call from radiology the MRI brain is concerning for mass as CT head had previously demonstrated.   9267 Discussed case with neurosurgery who recommends medicine-oncology admission.  Will page MAO.   0739 MRI Brain W Wo Contrast  IMPRESSION:  Peripherally enhancing mass within the left frontal lobe measuring up to 4.4 cm, concerning for malignancy.   9162 Discussed case with radiation oncology who will see the patient.   9147 Updated patient on findings.  Discussed plan for admission and discussion with neurosurgery, radiation oncology, medicine oncology.  Patient denies any urinary symptoms at home (no dysuria, no hematuria, no decreased urine output).  Discussed plan to discuss CT findings with urology.  Patient is comfortable with the plan.  He is comfortable at this time with all questions answered.   9142 Discussed case with urology who will see the patient.   9058 Admission orders placed.      ____________________________________________    The case was discussed with the attending physician, Dr. Beverley, who is in agreement with the above assessment and plan.        Vitals:    07/04/24 0749 07/04/24 1557 07/04/24 1700 07/04/24 1803   BP:  153/85 148/85 154/79   Pulse:  81 76 77   Resp:  25 25 18    Temp: 36.8 ??C (98.2 ??F) 36.3 ??C (97.3 ??F)  36.9 ??C (98.4 ??F)   TempSrc: Oral Oral  Oral   SpO2:  97% 96% 99%   Weight:       Height:           MRI Brain W Wo Contrast   Final Result   Peripherally enhancing mass within the left frontal lobe measuring up to 4.4 cm, concerning for malignancy.      ++++++++++++++++++++      The findings of this study were discussed via telephone with DR. Oryn Casanova by Dr. Renda Code on 07/04/2024 7:22 AM.      -----------------------------------------------      CT Chest W Contrast   Final Result      1.  No acute abnormality of the chest.      2.  No evidence of metastatic disease within the chest.    3.  Mildly enlarged pulmonary artery can be seen with pulmonary hypertension.  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 Recommend Urologic consultation for cystoscopy.         CT head WO contrast   Final Result      Vasogenic edema within the left frontal lobe concerning for a mass lesion. Recommend MRI brain with contrast for better characterization.      ++++++++++++++++++++      The findings of this study were discussed via epic chat with DR. ANDRES ANIBAL LOPEZ by Dr. Prentice Kansky on 07/03/2024 8:22 PM.      -----------------------------------------------                Lab Results   Component Value Date    WBC 7.8 07/03/2024    HGB 12.7 (L) 07/03/2024 HCT 38.5 (L) 07/03/2024    PLT 234 07/03/2024       Lab Results   Component Value Date    NA 145 07/03/2024    K 5.1 (H) 07/03/2024    CL 110 (H) 07/03/2024    CO2 21.8 07/03/2024    BUN 28 (H) 07/03/2024    CREATININE 1.79 (H) 07/03/2024    GLU 141 07/03/2024    CALCIUM 10.8 (H) 07/03/2024    MG 1.5 (L) 06/09/2024    PHOS 2.7 06/09/2024       Lab Results   Component Value Date    BILITOT 0.2 (L) 07/03/2024    BILIDIR 0.20 12/10/2023    PROT 7.2 07/03/2024    ALBUMIN 4.1 07/03/2024    ALT 17 07/03/2024    AST 20 07/03/2024    ALKPHOS 78 07/03/2024    GGT 20 06/21/2019       Lab Results   Component Value Date    LABPROT 11.0 05/14/2013    INR 1.06 01/19/2022    APTT 27.9 01/19/2022

## 2024-07-05 LAB — BASIC METABOLIC PANEL
ANION GAP: 12 mmol/L (ref 5–14)
BLOOD UREA NITROGEN: 33 mg/dL — ABNORMAL HIGH (ref 9–23)
BUN / CREAT RATIO: 18
CALCIUM: 10.5 mg/dL — ABNORMAL HIGH (ref 8.7–10.4)
CHLORIDE: 109 mmol/L — ABNORMAL HIGH (ref 98–107)
CO2: 22 mmol/L (ref 20.0–31.0)
CREATININE: 1.85 mg/dL — ABNORMAL HIGH (ref 0.73–1.18)
EGFR CKD-EPI (2021) MALE: 38 mL/min/1.73m2 — ABNORMAL LOW (ref >=60)
GLUCOSE RANDOM: 211 mg/dL — ABNORMAL HIGH (ref 70–179)
POTASSIUM: 5.1 mmol/L — ABNORMAL HIGH (ref 3.4–4.8)
SODIUM: 143 mmol/L (ref 135–145)

## 2024-07-05 LAB — CBC
HEMATOCRIT: 40.9 % (ref 39.0–48.0)
HEMOGLOBIN: 12.9 g/dL (ref 12.9–16.5)
MEAN CORPUSCULAR HEMOGLOBIN CONC: 31.5 g/dL — ABNORMAL LOW (ref 32.0–36.0)
MEAN CORPUSCULAR HEMOGLOBIN: 28.5 pg (ref 25.9–32.4)
MEAN CORPUSCULAR VOLUME: 90.3 fL (ref 77.6–95.7)
MEAN PLATELET VOLUME: 8.6 fL (ref 6.8–10.7)
PLATELET COUNT: 263 10*9/L (ref 150–450)
RED BLOOD CELL COUNT: 4.53 10*12/L (ref 4.26–5.60)
RED CELL DISTRIBUTION WIDTH: 15.4 % — ABNORMAL HIGH (ref 12.2–15.2)
WBC ADJUSTED: 8.2 10*9/L (ref 3.6–11.2)

## 2024-07-05 LAB — TACROLIMUS LEVEL, TROUGH: TACROLIMUS, TROUGH: 5.1 ng/mL (ref 5.0–15.0)

## 2024-07-05 MED ADMIN — allopurinol (ZYLOPRIM) tablet 100 mg: 100 mg | ORAL | @ 02:00:00

## 2024-07-05 MED ADMIN — allopurinol (ZYLOPRIM) tablet 100 mg: 100 mg | ORAL | @ 23:00:00

## 2024-07-05 MED ADMIN — dexAMETHasone (DECADRON) 4 mg/mL injection 4 mg: 4 mg | INTRAVENOUS | @ 13:00:00 | Stop: 2024-07-05

## 2024-07-05 MED ADMIN — dexAMETHasone (DECADRON) 4 mg/mL injection 4 mg: 4 mg | INTRAVENOUS | @ 01:00:00 | Stop: 2024-07-05

## 2024-07-05 MED ADMIN — dexAMETHasone (DECADRON) 4 mg/mL injection 4 mg: 4 mg | INTRAVENOUS | @ 06:00:00 | Stop: 2024-07-05

## 2024-07-05 MED ADMIN — tacrolimus (ENVARSUS XR) extended release tablet 5 mg: 5 mg | ORAL | @ 13:00:00

## 2024-07-05 MED ADMIN — atorvastatin (LIPITOR) tablet 40 mg: 40 mg | ORAL | @ 13:00:00

## 2024-07-05 MED ADMIN — dexAMETHasone (DECADRON) tablet 4 mg: 4 mg | ORAL | @ 21:00:00 | Stop: 2024-07-07

## 2024-07-05 NOTE — Plan of Care (Signed)
 Shift Summary  OT self-care/home management training was completed in the afternoon, supporting ADL progress.    dexAMETHasone  was administered late in the shift.    Skin inspection confirmed intact skin, and regular repositioning was maintained.    Discharge planning and caregiver education were addressed, with recommendations for ongoing high-intensity therapy.    Overall, safety interventions and comfort measures were consistently maintained, and no falls or injuries occurred during the shift.     Absence of Fall and Fall-Related Injury: Fall reduction interventions, bed alarms, and side rails were consistently maintained throughout the shift, with hourly visual checks confirming alertness and safety in bed; no falls or injuries occurred.     Absence of Hospital-Acquired Illness or Injury: Skin inspection remained intact where visualized, and positioning was alternated regularly between bed and chair to support skin integrity.     Optimal Comfort and Wellbeing: Pain was consistently reported as absent, and comfort was supported by regular repositioning and family presence.     Readiness for Transition of Care: Discharge planning included recommendations for high-intensity therapy five times weekly, with education and home exercise programs discussed; family and caregiver involvement was confirmed.     Improved Ability to Complete Activities of Daily Living: Minimal assistance was required for hygiene and eating, but moderate to maximum assistance was needed for bathing and lower body dressing, with OT self-care training provided during the shift.

## 2024-07-05 NOTE — Consults (Addendum)
 PHYSICAL THERAPY  Evaluation (07/05/24 1052)          Patient Name:  Frank Leach       Medical Record Number: [id number]   Date of Birth: Nov 10, 1950  Sex: Male        Post-Discharge Physical Therapy Recommendations:  PT Post Acute Discharge Recommendations: 5x weekly, High intensity (progress)   Discharge transport recommendations : Wheelchair Art Therapist Recommendation  PT DME Recommendations: None          Treatment Diagnosis: Abnormalities of gait and mobility, Difficulty in walking, Generalized muscle weakness, Lower extremity weakness, Impaired balance, Unsteadiness on feet        ASSESSMENT  Problem List: Decreased strength, Impaired balance, Decreased mobility, Gait deviation, Fall risk, Impaired ADLs, Decreased coordination, Decreased endurance, At risk for deconditioning      Assessment : Frank Leach is a 73 y.o. 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 Pt demonstrated deficits in transfers, ambulation, coordination, balance, and strength.  With increased time, pt able to demonstrate improved functional strength but struggles more with bilateral tasks.  Pt is at increased risk for falls and is notably below prior functional level. Pt will benefit from continued PT services while acutely admitted.  Recommend PT 5x weekly (high intensity) post acute discharge with potential to progress to 3x prior to d/c.  After a review of the personal factors, comorbidities, clinical presentation, and examination of the number of affected body systems, the patient presents as a moderate complexity case.         Today's Interventions: Evaluation, transfer training, standing balance, gait training with and without DMEPT education: role of PT and POC, d/c planning     Personal Factors/Comorbidities Present: 1-2 factors   Examination of Body systems: 3+ elements  Clinical Presentation: Evolving    Eval Complexity : Moderate Complexity     Activity Tolerance: Tolerated treatment well       PLAN  Planned Frequency of Treatment: Plan of Care Initiated: 07/05/24  1-2x per day Weekly Frequency: 3-4 days per week  Planned Treatment Duration: 07/12/24     Planned Interventions: Gait training, Home exercise program, Neuromuscular re-education, Education (Patient/Family/Caregiver), Self-care / Home Management training, Therapeutic Exercise, Therapeutic Activity     Goals:   Patient and Family Goals: None stated     SHORT GOAL #1: Pt will perform all bed mobility mod I               Time Frame : 1 week  SHORT GOAL #2: Pt will perform all functional transfers with LRAD mod I              Time Frame : 1 week  SHORT GOAL #3: Pt will ambulate 250' with LRAD mod I              Time Frame : 1 week  SHORT GOAL #4: Pt will negotiate 2 steps with R rail mod I               Time Frame : 1 week    Long Term Goal #1: Pt will score 24/24 on AMPAC  Time Frame: 2 weeks     Prognosis:  Good  Positive Indicators: PLOF, motivation  Barriers to Discharge: None  SUBJECTIVE  Communication Preference: Verbal     Patient reports: Agreeable to PT I was just out blowing leaves the other day but it made me so tired which is what told me something was off  Pain Comments: denied pain        Prior Functional Status: At baseline, pt is independent with mobility and ADL.  Pt is a retired arboriculturist.  Pt denied falls or any DME use at baseline.  Pt enjoys walking every morning and was recently blowing his and his neighbors leaves.  Living Situation  Living Environment: House  Lives With: Spouse  Home Living: One level home, Tub/shower unit, Hand-held shower hose, Handicapped height toilet, Stairs to enter without rails, Shower chair with back, Stairs to alternate level with rails, Grab bars in shower, Grab bars around toilet  Rail placement (outside): Bilateral rails in reach  Number of Stairs to Enter (outside): 2  Rail placement (inside): Rail on right side  Number of Stairs to Alternate level (inside): 2  Caregiver Identified?: Yes  Caregiver Availability: Intermittent  Caregiver Ability: Limited lifting  Caregiver Identified?: Yes   Equipment available at home: Bedside commode, Rolling walker, Straight cane        Past Medical History[1]         Social History     Tobacco Use    Smoking status: Never    Smokeless tobacco: Never   Substance Use Topics    Alcohol use: No       Past Surgical History[2]          Family History[3]     Allergies: Patient has no known allergies.                  Objective Findings  Precautions / Restrictions  Precautions: Falls precautions  Required Braces or Orthoses: Non-applicable     Medical Tests / Procedures: EMR reviewed, MRI brain W Wo Contrast Peripherally enhancing mass within the left frontal lobe measuring up to 4.4 cm, concerning for malignancy.  Equipment / Environment: Vascular access (PIV, TLC, Port-a-cath, PICC)     Vitals/Orthostatics : vitals stable per chart, no acute distress     Cognition: WFL  Cognition comment: occasional difficulty with problem solving, including moving walker out of the way in preparation for stairs and occasional right sided inattention  Orientation: Oriented x4  Visual/Perception: Wears Glasses/Contacts all the time  Hearing: No deficit identified     Skin Inspection: Intact where visualized     Upper Extremities  UE ROM: Right WFL, Left WFL  UE Strength: Right WFL, Left WFL  UE comment: 4+/5 R UE with increased time to engage compared to L UE    Lower Extremities  LE ROM: Right WFL, Left WFL  LE Strength: Right WFL, Left WFL  LE comment: 4+/5 R LE with increased time to engage compared to L LE    Face, Cervical and Trunk ROM  Cervical ROM: WFL  Trunk ROM: WFL     Coordination: Rapid alternating movements impaired  Proprioception: WFL  Sensation: WFL  Posture: WFL  Motor/Sensory/Neuro Comments: finger to nose slightly slower on R vs L, decreased accuracy with B heel to shin but symmetrical, difficulty with upper and lower extremity rapid alternating movements    Static Sitting-Level of Assistance: Standby Camera Operator of Assistance: Standby Surveyor, Mining of Assistance: Contact guard  Dynamic Standing - Level of Assistance: Minimum assistance  Standing Balance comments: CGA with RW static balance,  min A without RW static balance, min A for all dynamic balance due to persistent posterior lean with varying ability to correct      Bed Mobility: Supine to Sit  Supine to Sit assistance level: Standby assist, set-up cues, supervision of patient - no hands on  Bed Mobility comments: increased time     Transfers: Sit to Stand  Sit to Stand assistance level: Contact guard assist, steadying assist  Transfer comments: no DME, posterior lean, HHA with wide BOS with decreased ability to correct pre- gait      Ambulation  Level of Assistance: Contact guard assist, steadying assist  Assistive Device: Rolling walker  Distance Ambulated (ft): 150 ft  Ambulation comments: Pt amb with slow gait speed initially with wide BOS and very short steps while utilizing only HHA and min A.  Pt given RW and was able to progress to CGA, however required intermittent assist for RW management and obstacle avoidance     Stairs: Pt negotiated 2 practice steps with B rails CGA            Endurance: no acute distress    Patient at end of session: All needs in reach, Friends/Family present, In bed, Lines intact, Notified Nurse, Staff present            AM-PAC-6 click  Help currently need turning over In bed?: A Little - Minimal/Contact Guard Assist/Supervision  Help currently needed sitting down/standing up from chair with arms? : A Little - Minimal/Contact Guard Assist/Supervision  Help currently needed moving from supine to sitting on edge of bed?: A Little - Minimal/Contact Guard Assist/Supervision  Help currently needed moving to and from bed from wheelchair?: A Little - Minimal/Contact Guard Assist/Supervision  Help currently needed walking in a hospital room?: A Little - Minimal/Contact Guard Assist/Supervision  Help currently needed climbing 3-5 steps with railing?: A Little - Minimal/Contact Guard Assist/Supervision    Basic Mobility Score 6 click: 18    6 click Score (in points): % of Functional Impairment, Limitation, Restriction  6: 100% impaired, limited, restricted  7-8: At least 80%, but less than 100% impaired, limited restricted  9-13: At least 60%, but less than 80% impaired, limited restricted  14-19: At least 40%, but less than 60% impaired, limited restricted  20-22: At least 20%, but less than 40% impaired, limited restricted  23: At least 1%, but less than 20% impaired, limited restricted  24: 0% impaired, limited restricted    'AM-PAC' forms are Copyright protected by [employer]     Physical Therapy Session Duration  PT Individual [mins]: 13  PT Co-Treatment [mins]: 17  Reason for Co-treatment: To safely progress mobility (overlap session with Jenene Roers, OT)    I attest that I have reviewed the above information.  Signed: Donnice Sports, PT  Filed 07/05/2024          [1]   Past Medical History:  Diagnosis Date    Anemia     Cancer    (CMS-HCC)     clear cell adenocarcinoma of left kidney    Diabetes mellitus (CMS-HCC)     Diabetic nephropathy (CMS-HCC)     ESRD (end stage renal disease) on dialysis    (CMS-HCC)     ESRD on dialysis (CMS-HCC)     Gout     Hyperlipidemia     Hypertension    [2]   Past Surgical History:  Procedure Laterality Date    AV FISTULA PLACEMENT  PR COLONOSCOPY FLX DX W/COLLJ SPEC WHEN PFRMD N/A 04/30/2018    Procedure: COLONOSCOPY, FLEXIBLE, PROXIMAL TO SPLENIC FLEXURE; DIAGNOSTIC, W/WO COLLECTION SPECIMEN BY BRUSH OR WASH;  Surgeon: Dorn Lynwood Lauth, MD;  [facility];  Service: Gastroenterology    PR COLONOSCOPY W/BIOPSY SINGLE/MULTIPLE N/A 04/30/2018    Procedure: COLONOSCOPY, FLEXIBLE, PROXIMAL TO SPLENIC FLEXURE; WITH BIOPSY, SINGLE OR MULTIPLE;  Surgeon: Dorn Lynwood Lauth, MD;  [facility]: [facility];  Service: Gastroenterology    PR COLSC FLX W/RMVL OF TUMOR POLYP LESION SNARE TQ N/A 04/30/2018    Procedure: COLONOSCOPY FLEX; W/REMOV TUMOR/LES BY SNARE;  Surgeon: Dorn Lynwood Lauth, MD;  Location: [facility]ED[facility];  Service: Gastroenterology    PR EXPLORATORY OF ABDOMEN Midline 04/14/2016    Procedure: EXPLORATORY LAPAROTOMY, EXPLORATORY CELIOTOMY WITH OR WITHOUT BIOPSY(S);  Surgeon: Marsa Sam Boss, MD;  Location: MAIN OR [facility];  Service: Transplant    PR EXPLORATORY OF ABDOMEN N/A 04/16/2016    Procedure: EXPLORATORY LAPAROTOMY, EXPLORATORY CELIOTOMY WITH OR WITHOUT BIOPSY(S);  Surgeon: Marsa Sam Boss, MD;  Location: MAIN OR [facility];  Service: Transplant    PR LAP, RADICAL NEPHRECTOMY Right 12/27/2016    Procedure: Robotic Xi Laparoscopy; Radical Nephrectomy (Incl Remove Gerota`S Fascia, Fatty Tissue, Reg Lymph Node, Adrenalectomy);  Surgeon: Clorinda Ole Chalk, MD;  Location: MAIN OR [facility];  Service: Urology    PR LIGATN ANGIOACCESS AV FISTULA Left 11/29/2022    Procedure: LIGATION OR BANDING OF ANGIOACCESS ARTERIOVENOUS FISTULA;  Surgeon: [place], Marsa Sam, MD;  Location: MAIN OR [place];  Service: Transplant    PR NEGATIVE PRESSURE WOUND THERAPY DME >50 SQ CM N/A 04/12/2016    Procedure: NEG PRESS WOUND TX (VAC ASSIST) INCL TOPICALS, PER SESSION, TSA GREATER THAN/= 50 CM SQUARED;  Surgeon: Marsa Sam Boss, MD;  Location: MAIN OR [place];  Service: Transplant    PR NEGATIVE PRESSURE WOUND THERAPY DME >50 SQ CM Bilateral 04/14/2016    Procedure: NEG PRESS WOUND TX (VAC ASSIST) INCL TOPICALS, PER SESSION, TSA GREATER THAN/= 50 CM SQUARED;  Surgeon: Marsa Sam Boss, MD;  Location: MAIN OR [place];  Service: Transplant    PR NEGATIVE PRESSURE WOUND THERAPY DME >50 SQ CM N/A 04/16/2016    Procedure: NEG PRESS WOUND TX (VAC ASSIST) INCL TOPICALS, PER SESSION, TSA GREATER THAN/= 50 CM SQUARED;  Surgeon: Marsa Sam Boss, MD;  Location: MAIN OR [facility];  Service: Transplant    PR NEPHRECTOMY, W/PART. URETECTOMY Bilateral 04/11/2016    Procedure: LAPAROSCOPY, SURGICAL, NEPHRECTOMY WITH TOTAL URETERECTOMY;  Surgeon: Marsa Sam Boss, MD;  Location: MAIN OR [facility];  Service: Transplant    PR REMV KIDNEY,W/RIB RESECTION Bilateral 04/11/2016    Procedure: NEPHRECTOMY, INCLUDING PARTIAL URETERECTOMY, ANY OPEN APPROACH INCLUDING RIB RESECTION;  Surgeon: Marsa Sam Boss, MD;  Location: MAIN OR [facility];  Service: Transplant    PR TRANSPLANT,PREP LIVING  RENAL GRAFT N/A 06/22/2019    Procedure: BACKBENCH STD PREP LIVING DONOR RENAL ALLGRFT (OPEN/LAPROSC) PRIOR TO TRANSPLANT, INC DISSECT/REM AS NECESS;  Surgeon: Marsa Sam Boss, MD;  Location: MAIN OR [facility];  Service: Transplant    PR TRANSPLANTATION OF KIDNEY N/A 06/22/2019    Procedure: RENAL ALLOTRANSPLANTATION, IMPLANTATION OF GRAFT; WITHOUT RECIPIENT NEPHRECTOMY;  Surgeon: Marsa Sam Boss, MD;  Location: MAIN OR [facility];  Service: Transplant    SPLENECTOMY     [3]   Family History  Problem Relation Age of Onset    Kidney disease Mother     Cancer Father  Kidney disease Brother

## 2024-07-05 NOTE — Plan of Care (Signed)
 Shift Summary  Fall prevention and skin protection interventions were consistently maintained, and no falls or injuries were documented during the shift.  Sepsis risk remained low and stable, and skin integrity was supported with appropriate interventions.  Pain was well controlled and psychosocial status remained stable, with no changes in consciousness observed.  Family was present and engaged throughout the shift, receiving ongoing updates as documented.  Overall, interventions for safety, comfort, and infection prevention were maintained and the patient remained stable during the shift.    Absence of Fall and Fall-Related Injury: Fall reduction interventions such as bed alarms, hourly visual checks, and use of positioning devices were consistently maintained throughout the shift, and no falls or injuries were documented.    Absence of Hospital-Acquired Illness or Injury: Sepsis risk scores remained low and stable, and skin protection measures including absorbent pads, limited adhesive use, and anti-embolism devices were maintained during the shift.    Optimal Comfort and Wellbeing: Pain was assessed at 0 and psychosocial status was within defined limits, with no altered level of consciousness noted during the shift.    Readiness for Transition of Care: Unplanned readmission score showed a slight increase, and candidacy scores for IRF and LTACH were documented at the start of the shift.    Rounds/Family Conference: Family was present and visiting throughout the shift, with ongoing updates provided as documented.

## 2024-07-05 NOTE — Consults (Addendum)
 OCCUPATIONAL THERAPY  Evaluation (07/05/24 1105)    Patient Name:  Frank Leach       Medical Record Number: [id number]     Date of Birth: 05-02-1951  Sex: Male      Post-Discharge Occupational Therapy Recommendations: 5x weekly, High intensity     Equipment Recommendation  OT DME Recommendations: Defer to post acute       OT Treatment Diagnosis: Cognitive changes due to medical disorder, Generalized muscle weakness, Need for assistance with personal care, Unsteadiness on feet, Reduced mobility       Assessment  Assessment: Frank Leach is a 73 y.o. 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     Patient seen for initial OT evaluation and occupational profile. With consideration of patient's occupational profile, assessment review, level of clinical decision making involved, and intervention plan, patient presents as a High complexity case w/ the following functional deficits: decreased strength, endurance, activity tolerance, balance, decreased coordination, deficits in cognitive processing, impaired safety awareness, and increased fall risk  that impact independent participation in ADLs, IADLs, and functional mobility skills. At baseline he is active and Independent with all functional tasks. During session impaired balance, decreased trunk control, and decreased cognition were main limiting factors. He currently requires Mod-Min A with ADL tasks and Min A-CGA with functional mobility skills. He will benefit from acute skilled OT to address above areas and increase functional status. Recommend post-acute OT 5x/week High Intensity to maximize safety and functional independence. MOCA completed, patient scored 17/30-moderate cognitive deficit    Problem List: Decreased activity tolerance, Decreased cognition, Decreased coordination, Decreased endurance, Decreased mobility, Decreased safety awareness, Decreased strength, Fall risk, Gait deviation, Impaired ADLs, Impaired balance    Clinical Decision Making: High Complexity    Skilled Interventions Performed: Other  Today's Interventions: Functional mobility: bed mobility, sit to supine, sit<>stand, sitting/standing endurance and balance, postural control, short household distance ambulation, LE ADL tasks, functional transfers, and MOCA assessment. Patient education re: OT role, POC, DME, safety precautions with mobility skills, compensatory strategies with ADL tasks, and discharge planning.    Activity Tolerance During Today's Session  Tolerated treatment well    Plan  Planned Frequency of Treatment: Plan of Care Initiated: 07/05/24  1-2x per day Weekly Frequency: 3-4 days per week  Planned Treatment Duration: 07/18/24    Planned Interventions:  Education (Patient/Family/Caregiver), Clinical Research Associate, Home Exercise Program, Therapeutic Exercise, Therapeutic Activity, Self-Care/Home Training, Neuromuscular Re-education      GOALS:   Patient and Family Goals: Go home and get better    Short Term:   SHORT GOAL #1: Patient will perform functional transfers and all aspect of toileting with Mod I using LRAD   Time Frame : 2 weeks  SHORT GOAL #2: Patient will perform LB dressing with Mod I using compensatory strategies   Time Frame : 2 weeks  SHORT GOAL #3: Patient will perform sponge bath tasks with Mod I using compensatory strategies   Time Frame : 2 weeks  SHORT GOAL #4: Patient will perform grooming tasks standing at the sink with Mod I using LRAD   Time Frame : 2 weeks    Long Term Goal #1: Patient will score 20+/24 on AMPAC in 5 weeks     Prognosis:   (Good for stated goals)  Positive Indicators:   (PLOF)  Barriers to Discharge:  None    Subjective  Prior Functional Status At baseline he is active and Independent with ADLs, IADLs, and functional mobility skills without assistive device. Active, enjoys yard work. No recent falls. Spouse works outside the home 4days week ~ 3 hours. She is able to provide support as needed. Used to work as a arboriculturist.    Living Situation  Living environment: House  Lives With: Spouse  Home Living: One level home, Stairs to enter with rails, Tub/shower unit, Shower chair without back, Standard height toilet, Bedside commode  Rail placement (outside): Bilateral rails in reach  Number of Stairs to Enter (outside): 2  Caregiver Identified?: Yes  Caregiver Availability: Intermittent  Caregiver Ability: Limited lifting  Caregiver Identified?: Yes  Equipment available at home: Bedside commode, Rolling walker, Straight cane    Medical Tests / Procedures: Reviewed in EPIC       Patient / Caregiver reports: Before a couple of weeks ago, I was outside doing some yard work    Past Medical History[1] Social History     Tobacco Use    Smoking status: Never    Smokeless tobacco: Never   Substance Use Topics    Alcohol use: No      Past Surgical History[2] Family History[3]     Patient has no known allergies.     Objective Findings  Precautions / Restrictions  Falls precautions      Required Braces or Orthoses  Non-applicable    Communication Preference  Verbal       Pain  Reports no pain    Equipment / Environment  Vascular access (PIV, TLC, Port-a-cath, PICC)    Cognition   Orientation Level:  Oriented x 4   Arousal/Alertness:  Delayed responses to stimuli   Attention Span:  Attends with cues to redirect   Memory:  Decreased short term memory   Following Commands:  Follows one-step commands, Requires increased time   Safety Judgment:   (Needs assistance)   Awareness of Errors and Problem Solving:   (Needs assistance)   Comments: Noted with difficulty with higher cognitive skills such as problem solving skills, needs repetition of directions, easily distracted, limited insight into deficits.    Vision / Hearing Vision: No acute deficits identified, Wears glasses all the time     Hearing: No deficit identified       Hand Function:  Hand Function comments:  (ROM and strength:WFL, noted with mild deficits with rapid alternating movements and well as decreased speed with finger to nose primarily with RUE. Intermittent mild RUE tremor.)  Hand Dominance: Right    Skin Inspection:  Skin Inspection: Intact where visualized    ROM / Strength:  UE ROM/Strength: Left WFL, Right WFL  LE ROM/Strength: Left WFL, Right WFL    Sensation:  RUE Sensation: RUE intact  LUE Sensation: LUE intact  RLE Sensation: RLE intact  LLE Sensation: LLE intact    Balance:  Static Sitting-Level of Assistance: Stand by Camera Operator of Assistance: Stand by assistance, Contact guard (during LB ADL tasks)    Static Standing-Level of Assistance: Contact guard  Dynamic Standing - Level of Assistance: Minimum assistance  Standing Balance comments:  (wit)    Functional Mobility  Transfers:  (sit<>stand and functional transfers: Min A with RW)  Bed Mobility :  (sit to supine: SBA with use of bedrail)    Ambulation  Level of Assistance: Contact guard assist, steadying assist  Assistive Device: Rolling walker    ADLs  Feeding : Set Up Assist  Grooming: Min assist, Performed seated  Bathing: Mod assist, Physical assistance required  Toileting: Min assist, Physical assistance required  UB Dressing: Min assist (related to decreased postural control and lateral lean to the right)  LB Dressing: Mod assist, Physical assistance required    IADLs: NT    Patient at end of session: All needs in reach, In bed, Lines intact, Notified Nurse, Alarm activated     Occupational Therapy Session Duration  OT Individual [mins]: 32  OT Co-Treatment [mins]: 17 (with Matt Pilo, PT)  Reason for Co-treatment: To safely progress mobility       AM-PAC Daily Activity Assessment  Lower Body Dressing assistance needs: A lot - Maximum/Moderate Assistance  Bathing assistance needs: A lot - Maximum/Moderate Assistance  Toileting assistance needs: A Little - Minimal/Contact Guard Assist/Supervision  Upper Body Dressing assistance needs: A Little - Minimal/Contact Guard Assist/Supervision  Personal Grooming assistance needs: A Little - Minimal/Contact Guard Assist/Supervision  Eating Meals assistance needs: A Little - Minimal/Contact Guard Assist/Supervision    Daily Activity Score: 16    Score (in points): % of Functional Impairment, Limitation, Restriction  5: 100% impaired, limited, restricted  6-7: At least 80%, but less than 100% impaired, limited restricted  8-11: At least 60%, but less than 80% impaired, limited restricted  12-16: At least 40%, but less than 60% impaired, limited restricted  17-18: At least 20%, but less than 40% impaired, limited restricted  19: At least 1%, but less than 20% impaired, limited restricted  20: 0% impaired, limited restricted    'AM-PAC' forms are Copyright protected by [employer]      [facility] COGNITIVE ASSESSMENT [facility])  Version: 8.1    Pass Criteria:  Normal >/= 26/30    Patient's Score: 17/30, indicating Moderate cognitive impairment     Sub-Test Raw Score Comments   Attention     Digit  1/2   :for repeating 3-5 digits forward and backwards   Letters  1/1   :for identifying target letter /a/ in alphabet sequence   Serial 7  2/3   :for counting backwards by 7's   Language     Repetition  1/2   :for repeating 13+syllable sentence   Fluency  0/1   :for naming 11+/f/ words in one minute   Abstraction  2/2    :for identifying similarities between items   Delayed Recall  0/5   : Accuracy improved to  /5 when given categorical and multiple choice cues.    Orientation  6/6   :for month, year, date, place, city, day of the week   Naming    2/3  Naming animals  Visuospatial/Executive 1/5  Connecting sequence of numbers and letters, copy cube, clock  Patient receives 1 extra pont for 12th grade education    18-25 = mild cognitive impairment, 10-17= moderate cognitive impairment and less than 10= severe cognitive impairment.    I attest that I have reviewed the above information.  Signed: Jenene Roers, OT  Filed 07/05/2024                 [1]   Past Medical History:  Diagnosis Date    Anemia     Cancer    (CMS-HCC)     clear cell adenocarcinoma of left kidney    Diabetes mellitus (CMS-HCC)     Diabetic nephropathy (CMS-HCC)     ESRD (end stage renal disease) on dialysis    (CMS-HCC)  ESRD on dialysis (CMS-HCC)     Gout     Hyperlipidemia     Hypertension    [2]   Past Surgical History:  Procedure Laterality Date    AV FISTULA PLACEMENT      PR COLONOSCOPY FLX DX W/COLLJ SPEC WHEN PFRMD N/A 04/30/2018    Procedure: COLONOSCOPY, FLEXIBLE, PROXIMAL TO SPLENIC FLEXURE; DIAGNOSTIC, W/WO COLLECTION SPECIMEN BY BRUSH OR WASH;  Surgeon: Dorn Lynwood Lauth, MD;  Location: [facility]ED[facility];  Service: Gastroenterology    PR COLONOSCOPY W/BIOPSY SINGLE/MULTIPLE N/A 04/30/2018    Procedure: COLONOSCOPY, FLEXIBLE, PROXIMAL TO SPLENIC FLEXURE; WITH BIOPSY, SINGLE OR MULTIPLE;  Surgeon: Dorn Lynwood Lauth, MD;  Location: [facility];  Service: Gastroenterology    PR COLSC FLX W/RMVL OF TUMOR POLYP LESION SNARE TQ N/A 04/30/2018    Procedure: COLONOSCOPY FLEX; W/REMOV TUMOR/LES BY SNARE;  Surgeon: Dorn Lynwood Lauth, MD;  Location: [facility];  Service: Gastroenterology    PR EXPLORATORY OF ABDOMEN Midline 04/14/2016    Procedure: EXPLORATORY LAPAROTOMY, EXPLORATORY CELIOTOMY WITH OR WITHOUT BIOPSY(S);  Surgeon: Marsa Sam Boss, MD;  Location: MAIN OR [facility];  Service: Transplant    PR EXPLORATORY OF ABDOMEN N/A 04/16/2016    Procedure: EXPLORATORY LAPAROTOMY, EXPLORATORY CELIOTOMY WITH OR WITHOUT BIOPSY(S);  Surgeon: Marsa Sam Boss, MD;  Location: MAIN OR [facility];  Service: Transplant    PR LAP, RADICAL NEPHRECTOMY Right 12/27/2016    Procedure: Robotic Xi Laparoscopy; Radical Nephrectomy (Incl Remove Gerota`S Fascia, Fatty Tissue, Reg Lymph Node, Adrenalectomy);  Surgeon: Clorinda Ole Chalk, MD;  Location: [facility] OR [facility];  Service: Urology    PR LIGATN ANGIOACCESS AV FISTULA Left 11/29/2022    Procedure: LIGATION OR BANDING OF ANGIOACCESS ARTERIOVENOUS FISTULA;  Surgeon: [place], Marsa Sam, MD;  Location: MAIN OR [place];  Service: Transplant    PR NEGATIVE PRESSURE WOUND THERAPY DME >50 SQ CM N/A 04/12/2016    Procedure: NEG PRESS WOUND TX (VAC ASSIST) INCL TOPICALS, PER SESSION, TSA GREATER THAN/= 50 CM SQUARED;  Surgeon: Marsa Sam Boss, MD;  Location: MAIN OR [place];  Service: Transplant    PR NEGATIVE PRESSURE WOUND THERAPY DME >50 SQ CM Bilateral 04/14/2016    Procedure: NEG PRESS WOUND TX (VAC ASSIST) INCL TOPICALS, PER SESSION, TSA GREATER THAN/= 50 CM SQUARED;  Surgeon: Marsa Sam Boss, MD;  Location: MAIN OR [place];  Service: Transplant    PR NEGATIVE PRESSURE WOUND THERAPY DME >50 SQ CM N/A 04/16/2016    Procedure: NEG PRESS WOUND TX (VAC ASSIST) INCL TOPICALS, PER SESSION, TSA GREATER THAN/= 50 CM SQUARED;  Surgeon: Marsa Sam Boss, MD;  Location: MAIN OR [facility];  Service: Transplant    PR NEPHRECTOMY, W/PART. URETECTOMY Bilateral 04/11/2016    Procedure: LAPAROSCOPY, SURGICAL, NEPHRECTOMY WITH TOTAL URETERECTOMY;  Surgeon: Marsa Sam Boss, MD;  Location: MAIN OR [facility];  Service: Transplant    PR REMV KIDNEY,W/RIB RESECTION Bilateral 04/11/2016    Procedure: NEPHRECTOMY, INCLUDING PARTIAL URETERECTOMY, ANY OPEN APPROACH INCLUDING RIB RESECTION;  Surgeon: Marsa Sam Boss, MD;  Location: MAIN OR [facility];  Service: Transplant    PR TRANSPLANT,PREP LIVING  RENAL GRAFT N/A 06/22/2019    Procedure: BACKBENCH STD PREP LIVING DONOR RENAL ALLGRFT (OPEN/LAPROSC) PRIOR TO TRANSPLANT, INC DISSECT/REM AS NECESS;  Surgeon: Marsa Sam Boss, MD;  Location: MAIN OR [facility];  Service: Transplant    PR TRANSPLANTATION OF KIDNEY N/A 06/22/2019    Procedure: RENAL ALLOTRANSPLANTATION, IMPLANTATION OF GRAFT; WITHOUT RECIPIENT NEPHRECTOMY;  Surgeon: Marsa Sam Boss, MD;  Location: MAIN OR [facility];  Service: Transplant    SPLENECTOMY     [3]   Family History  Problem Relation Age of Onset    Kidney disease Mother     Cancer Father     Kidney disease Brother

## 2024-07-05 NOTE — Progress Notes (Signed)
 Internal Medicine (MEDL) Progress Note    Assessment & Plan:   Frank Leach is a 73 y.o. 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    Principal Problem:    Frontal mass of brain  Active Problems:    Hyperlipidemia    HTN (hypertension)    Kidney replaced by transplant ([facility]-HCC)    Bladder mass    Left leg weakness        Active Problems    Left frontal lobe brain mass  Right sided weakness  Concern for new malignancy of unclear origin  Patient presented with 1 week of progressive right sided weakness, with no other neurologic issues or deficits, such as headache, dizziness, double vision, or confusion.  MRI brain with peripherally enhancing mass within left frontal lobe measuring 4.4 cm with vasogenic edema, concerning for malignancy. 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 Weakness improving on IV Dex, plan to transition to oral.  - Neurosurgery consulted, appreciate recommendations   - Will discuss case on August 02, 2024 12/1  - Stop IV dexamethasone   - Start PO dexamethasone  taper: 4 mg q8 x3 days then 4q12 x3 days then 4 qd x3 days then 2 mg x3 days then 1 mg daily x3 days then stop (11/27- 12/13 anticipated)  - Daily neurologic exams  - Daily CBC  - Recommended for AIR   - PMR referral placed, need to page on 2024/08/02 12/1 for eval    - Medical oncology consulted, appreciate recommendations   - Recommend to replace outpatient referral on discharge  - Radiation oncology consulted appreciate recommendations   - -Recommend to replace outpatient referral on discharge  - Urology consulted as below, appreciate recommendations   - Recommend to replace outpatient referral on discharge  - Palliative care consulted, appreciate connection to resources for psychosocial support   - - Recommend to replace outpatient referral on discharge        Bladder mass  Concern for new malignancy of unclear origin  CT abdomen pelvis on admission with bilobed mass or adjacent synchronous masses in the right anterior bladder wall, 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  Patient with no new or worsening abdominal symptoms, no gross hematuria or other urinary symptoms, no history of tobacco use.  On admission, kidney function at baseline, no concern for urinary retention or obstruction.  High concern for concern for urothelial carcinoma of the bladder, especially given new frontal brain mass as above, versus new lymphoma in the setting of chronic immunosuppression. Less likely recurrent RCC, as s/p bilateral nephrectomy.  - Urology consulted appreciate recommendations              - No urgent urologic intervention indicated              - Plan for outpatient cystoscopy and transurethral resection of bladder tumor/bladder biopsy for tissue diagnosis  - Daily BMP     History of renal cell carcinoma status post bilateral nephrectomy  Deceased donor kidney transplant  Hypertension  Bilateral nephrectomy in 08-02-2016 and 2017/08/02 secondary to RCC. Deceased donor kidney transplant in Aug 03, 2019.  Followed closely by Dr. [name] with [facility] transplant nephrology.  On admission, creatinine level at 1.9 which is within his usual  baseline oliguric urine output, and no other urinary symptoms.   - Transplant nephrology consulted, appreciate recommendations              - Hold mycophenolate               - Continue tacrolimus  5 mg daily, check tacrolimus  24-hour trough levels and target levels of 4 to 6 ng/mL              - Continue home amlodipine  10 mg daily              - Hold home olmesartan  40 mg daily     Type 2 diabetes mellitus  Hemoglobin A1c 6.8  - Hold home metformin  500 mg daily  - Daily BMP  - Add sliding scale insulin  if hyperglycemic, given steroids as above        Chronic Problems    Hyperlipidemia  Continue atorvastatin  40 mg daily        Daily Checklist:  Diet: Regular Diet  DVT PPx: SCDs  Electrolytes: Replete Potassium to >/= 3.6 and Magnesium to >/= 1.8  Code Status: Full Code  Dispo: Admit to observation    Team Contact Information:   Primary Team: Internal Medicine (MEDL)  Primary Resident: Kip Brand, MD, MD  Resident's Pager: [phone] (Gen MedL Intern - [place])    Interval History:   No acute events overnight.    Patient slept well overnight and feels his strength is getting better, but slightly unsteady on his feet. Eager to undergo PT.OT assessment. No new neurologic symptoms.    ROS: Denies headache, chest pain, shortness of breath, abdominal pain, nausea, vomiting.    Objective:     Gen: NAD, converses   Eyes: Sclera anicteric, EOMI grossly normal   HENT: Atraumatic, normocephalic  Neck: Trachea midline  Heart: RRR  Lungs: CTAB, no crackles or wheezes  Abdomen: Soft, NTND  Extremities: No edema  Neuro: 4/5 strength RLE extension and flexion at hip, knees, ankle, sensation intact bilateral lower extremities  Skin:  No rashes, lesions on clothed exam  Psych: Alert, oriented    Kip Brand, MD  PGY-1 Internal Medicine  [facility]

## 2024-07-06 LAB — CBC
HEMATOCRIT: 37.8 % — ABNORMAL LOW (ref 39.0–48.0)
HEMOGLOBIN: 11.9 g/dL — ABNORMAL LOW (ref 12.9–16.5)
MEAN CORPUSCULAR HEMOGLOBIN CONC: 31.6 g/dL — ABNORMAL LOW (ref 32.0–36.0)
MEAN CORPUSCULAR HEMOGLOBIN: 28.5 pg (ref 25.9–32.4)
MEAN CORPUSCULAR VOLUME: 90.4 fL (ref 77.6–95.7)
MEAN PLATELET VOLUME: 8.9 fL (ref 6.8–10.7)
PLATELET COUNT: 252 10*9/L (ref 150–450)
RED BLOOD CELL COUNT: 4.18 10*12/L — ABNORMAL LOW (ref 4.26–5.60)
RED CELL DISTRIBUTION WIDTH: 15.1 % (ref 12.2–15.2)
WBC ADJUSTED: 10 10*9/L (ref 3.6–11.2)

## 2024-07-06 LAB — BASIC METABOLIC PANEL
ANION GAP: 12 mmol/L (ref 5–14)
BLOOD UREA NITROGEN: 30 mg/dL — ABNORMAL HIGH (ref 9–23)
BUN / CREAT RATIO: 16
CALCIUM: 9.9 mg/dL (ref 8.7–10.4)
CHLORIDE: 109 mmol/L — ABNORMAL HIGH (ref 98–107)
CO2: 20 mmol/L (ref 20.0–31.0)
CREATININE: 1.9 mg/dL — ABNORMAL HIGH (ref 0.73–1.18)
EGFR CKD-EPI (2021) MALE: 37 mL/min/1.73m2 — ABNORMAL LOW (ref >=60)
GLUCOSE RANDOM: 222 mg/dL — ABNORMAL HIGH (ref 70–179)
POTASSIUM: 5.4 mmol/L — ABNORMAL HIGH (ref 3.4–4.8)
SODIUM: 141 mmol/L (ref 135–145)

## 2024-07-06 MED ADMIN — allopurinol (ZYLOPRIM) tablet 100 mg: 100 mg | ORAL | @ 22:00:00

## 2024-07-06 MED ADMIN — tacrolimus (ENVARSUS XR) extended release tablet 5 mg: 5 mg | ORAL | @ 15:00:00

## 2024-07-06 MED ADMIN — senna (SENOKOT) tablet 1 tablet: 1 | ORAL | @ 15:00:00

## 2024-07-06 MED ADMIN — atorvastatin (LIPITOR) tablet 40 mg: 40 mg | ORAL | @ 15:00:00

## 2024-07-06 MED ADMIN — polyethylene glycol (MIRALAX) packet 17 g: 17 g | ORAL | @ 15:00:00

## 2024-07-06 MED ADMIN — dexAMETHasone (DECADRON) tablet 4 mg: 4 mg | ORAL | @ 02:00:00 | Stop: 2024-07-07

## 2024-07-06 MED ADMIN — dexAMETHasone (DECADRON) tablet 4 mg: 4 mg | ORAL | @ 10:00:00 | Stop: 2024-07-07

## 2024-07-06 MED ADMIN — dexAMETHasone (DECADRON) tablet 4 mg: 4 mg | ORAL | @ 21:00:00 | Stop: 2024-07-07

## 2024-07-06 NOTE — Plan of Care (Signed)
 Shift Summary  Ambulation distance increased during the shift, with patient using a front wheel walker and requiring standby assist.     Fall prevention strategies were maintained, including regular toileting, hourly checks, and family support.     Hygiene and bathing tasks required moderate assistance and were completed with help from family.     Laxatives were administered in the morning.     Patient remained safe and engaged in mobility and ADLs with support throughout the shift.     Absence of Fall and Fall-Related Injury: Fall prevention interventions were consistently implemented throughout the shift, including regular toileting, hourly visual checks, and family presence at bedside; no falls or injuries occurred. Safety devices were not required for transfers, and the bed/chair alarm remained off.     Improved Ability to Complete Activities of Daily Living: Ambulation distance increased from 230 ft to 400 ft with standby assist and use of a front wheel walker, but moderate assistance was still needed for hygiene and bathing, which were completed with help from family.

## 2024-07-06 NOTE — Progress Notes (Signed)
 Internal Medicine ([facility]) Progress Note    Assessment & Plan:   Frank Leach is a 73 y.o. 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    Principal Problem:    Frontal mass of brain  Active Problems:    Hyperlipidemia    HTN (hypertension)    Kidney replaced by transplant ([facility])    Bladder mass    Left leg weakness        Active Problems    Left frontal lobe brain mass  Right sided weakness  Concern for new malignancy of unclear origin  Patient presented with 1 week of progressive right sided weakness, with no other neurologic issues or deficits, such as headache, dizziness, double vision, or confusion.  MRI brain with peripherally enhancing mass within left frontal lobe measuring 4.4 cm with vasogenic edema, concerning for malignancy. 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 Transitioned from IV dexamethasone  to oral on 11/29 with ongoing improvement in symptoms. Patient noting improvement in strength after this. Now recommended for AIR. Awaiting PM and R consultation on 12/1. Neuro-oncology team will also be back on 12/1 for further discussion of case.   - Neurosurgery consulted, appreciate recommendations   - Will discuss case on Monday 12/1  - Start PO dexamethasone  taper: 4 mg q8 x3 days (11/29-12/1) then 4q12 x3 days then 4 qd x3 days then 2 mg x3 days then 1 mg daily x3 days then stop (11/27- 12/13 anticipated)  - Daily neurologic exams  - Daily CBC  - Recommended for AIR   - PMR referral placed, need to page on Monday 12/1 for eval    - Medical oncology consulted, appreciate recommendations   - Recommend to replace outpatient referral on discharge  - Radiation oncology consulted appreciate recommendations   - -Recommend to replace outpatient referral on discharge  - Urology consulted as below, appreciate recommendations   - Recommend to replace outpatient referral on discharge  - Palliative care consulted, appreciate connection to resources for psychosocial support   - - Recommend to replace outpatient referral on discharge        Bladder mass  Concern for new malignancy of unclear origin  CT abdomen pelvis on admission with bilobed mass or adjacent synchronous masses in the right anterior bladder wall, 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  Patient with no new or worsening abdominal symptoms, no gross hematuria or other urinary symptoms, no history of tobacco use.  On admission, kidney function at baseline, no concern for urinary retention or obstruction.  High concern for concern for urothelial carcinoma of the bladder, especially given new frontal brain mass as above, versus new lymphoma in the setting of chronic immunosuppression. Less likely recurrent RCC, as s/p bilateral nephrectomy.  - Urology consulted appreciate recommendations              - No urgent urologic intervention indicated              - Plan for outpatient cystoscopy and transurethral resection of bladder tumor/bladder biopsy for tissue diagnosis  - Daily BMP     Hyperkalemia  K elevate to 5.4 on 11/30. Will continue to monitor and consider K lowering therapies as indicated. Appears to be intermittently  chronic issue.   -Daily BMP    History of renal cell carcinoma status post bilateral nephrectomy  Deceased donor kidney transplant  Hypertension  Bilateral nephrectomy in 07/30/16 and 07/30/17 secondary to RCC. Deceased donor kidney transplant in July 31, 2019.  Followed closely by Dr. [name] with [facility] transplant nephrology.  On admission, creatinine level at 1.9 which is within his usual baseline oliguric urine output, and no other urinary symptoms.   - Transplant nephrology consulted, appreciate recommendations              - Hold mycophenolate               - Continue tacrolimus  5 mg daily, check tacrolimus  24-hour trough levels and target levels of 4 to 6 ng/mL              - Continue home amlodipine  10 mg daily              - Hold home olmesartan  40 mg daily     Type 2 diabetes mellitus  Hemoglobin A1c 6.8  - Hold home metformin  500 mg daily  - Daily BMP  - Add sliding scale insulin  if hyperglycemic, given steroids as above        Chronic Problems    Hyperlipidemia  Continue atorvastatin  40 mg daily      Daily Checklist:  Diet: Regular Diet  DVT PPx: SCDs  Electrolytes: Replete Potassium to >/= 3.6 and Magnesium to >/= 1.8  Code Status: Full Code  Dispo: Admit to observation    Team Contact Information:   Primary Team: Internal Medicine ([facility])  Primary Resident: Redell GORMAN Devonshire, MD, MD  Resident's Pager: [phone] (Gen Med[facility] Intern - [place])    Interval History:   No acute events overnight. Patient notes improvement in strength since starting steroids.       ROS: Denies headache, chest pain, shortness of breath, abdominal pain, nausea, vomiting.    Objective:     Gen: NAD, converses   Eyes: Sclera anicteric, EOMI grossly normal   HENT: Atraumatic, normocephalic  Neck: Trachea midline  Heart: RRR  Lungs: CTAB, no crackles or wheezes  Abdomen: Soft, NTND  Extremities: No edema  Neuro: 5/5 strength RLE extension and flexion at hip, knees, ankle, sensation intact bilateral lower extremities  Skin:  No rashes, lesions on clothed exam  Psych: Alert, oriented    Redell Devonshire, MD  PGY-3  Internal Medicine

## 2024-07-06 NOTE — Consults (Signed)
Additional order received and the patient is already on the OT caseload. Continue with  plan of care.

## 2024-07-06 NOTE — Plan of Care (Signed)
 Shift Summary  Fall reduction program and hourly visual checks were maintained throughout the shift, with no falls or injuries documented.  Skin protection and perineal care were performed, and sepsis risk scores remained unchanged at 1.  Pain remained at 0, and comfort interventions were maintained.  Family was present and updated, and transition of care scores were stable.  Moderate assistance was required for hygiene and bathing, with patient/family involvement in showering; mobility assistance was standby only.  Overall, safety and comfort interventions were consistently implemented, and no major adverse events occurred during the shift.    Absence of Fall and Fall-Related Injury: Fall reduction program and hourly visual checks were consistently maintained, with bed alarms in use and toileting every two hours; no falls or injuries were documented during the shift. Positioning and transfer devices were used as needed, and safety interventions were regularly implemented.    Absence of Hospital-Acquired Illness or Injury: Skin protection measures and perineal care were performed throughout the shift, and sepsis risk scores remained unchanged at 1. Anti-embolism devices were refused, but incontinence pads and limited adhesive use were maintained.    Optimal Comfort and Wellbeing: Pain was consistently assessed and remained at 0, and comfort interventions such as positioning and use of pillows were maintained. No discomfort or pain was reported during the shift.    Readiness for Transition of Care: Unplanned readmission scores remained stable, and family was present and updated throughout the shift. IRF and LTACH candidacy scores were documented at the start of the shift.    Improved Ability to Complete Activities of Daily Living: Moderate assistance was required for hygiene and bathing, with patient/family involvement in showering; oral care was performed independently. Standby assist was provided for mobility, and right hand grip remained weak with poor right side muscle coordination documented.

## 2024-07-07 LAB — CBC
HEMATOCRIT: 37.4 % — ABNORMAL LOW (ref 39.0–48.0)
HEMOGLOBIN: 12.5 g/dL — ABNORMAL LOW (ref 12.9–16.5)
MEAN CORPUSCULAR HEMOGLOBIN CONC: 33.3 g/dL (ref 32.0–36.0)
MEAN CORPUSCULAR HEMOGLOBIN: 29.7 pg (ref 25.9–32.4)
MEAN CORPUSCULAR VOLUME: 89.3 fL (ref 77.6–95.7)
MEAN PLATELET VOLUME: 8.9 fL (ref 6.8–10.7)
PLATELET COUNT: 200 10*9/L (ref 150–450)
RED BLOOD CELL COUNT: 4.19 10*12/L — ABNORMAL LOW (ref 4.26–5.60)
RED CELL DISTRIBUTION WIDTH: 15.3 % — ABNORMAL HIGH (ref 12.2–15.2)
WBC ADJUSTED: 10.5 10*9/L (ref 3.6–11.2)

## 2024-07-07 LAB — TACROLIMUS LEVEL, TIMED: TACROLIMUS BLOOD: 3.9 ng/mL

## 2024-07-07 LAB — BASIC METABOLIC PANEL
ANION GAP: 12 mmol/L (ref 5–14)
BLOOD UREA NITROGEN: 37 mg/dL — ABNORMAL HIGH (ref 9–23)
BUN / CREAT RATIO: 21
CALCIUM: 9.8 mg/dL (ref 8.7–10.4)
CHLORIDE: 109 mmol/L — ABNORMAL HIGH (ref 98–107)
CO2: 20 mmol/L (ref 20.0–31.0)
CREATININE: 1.77 mg/dL — ABNORMAL HIGH (ref 0.73–1.18)
EGFR CKD-EPI (2021) MALE: 41 mL/min/1.73m2 — ABNORMAL LOW (ref >=60)
GLUCOSE RANDOM: 250 mg/dL — ABNORMAL HIGH (ref 70–179)
POTASSIUM: 5.2 mmol/L — ABNORMAL HIGH (ref 3.4–4.8)
SODIUM: 141 mmol/L (ref 135–145)

## 2024-07-07 MED ADMIN — allopurinol (ZYLOPRIM) tablet 100 mg: 100 mg | ORAL | @ 23:00:00

## 2024-07-07 MED ADMIN — tacrolimus (ENVARSUS XR) extended release tablet 5 mg: 5 mg | ORAL | @ 13:00:00

## 2024-07-07 MED ADMIN — senna (SENOKOT) tablet 1 tablet: 1 | ORAL | @ 13:00:00

## 2024-07-07 MED ADMIN — senna (SENOKOT) tablet 1 tablet: 1 | ORAL | @ 02:00:00

## 2024-07-07 MED ADMIN — atorvastatin (LIPITOR) tablet 40 mg: 40 mg | ORAL | @ 13:00:00

## 2024-07-07 MED ADMIN — insulin lispro (HumaLOG) injection CORRECTIONAL 0-20 Units: 0-20 [IU] | SUBCUTANEOUS | @ 21:00:00

## 2024-07-07 MED ADMIN — insulin lispro (HumaLOG) injection CORRECTIONAL 0-20 Units: 0-20 [IU] | SUBCUTANEOUS | @ 18:00:00

## 2024-07-07 MED ADMIN — dexAMETHasone (DECADRON) tablet 4 mg: 4 mg | ORAL | @ 21:00:00 | Stop: 2024-07-10

## 2024-07-07 MED ADMIN — dexAMETHasone (DECADRON) tablet 4 mg: 4 mg | ORAL | @ 10:00:00 | Stop: 2024-07-07

## 2024-07-07 MED ADMIN — dexAMETHasone (DECADRON) tablet 4 mg: 4 mg | ORAL | @ 02:00:00 | Stop: 2024-07-07

## 2024-07-07 NOTE — Plan of Care (Signed)
 Shift Summary  Insulin  lispro was administered twice in response to elevated blood glucose levels during the shift.   Occupational therapy provided self-care and home management training, and activity tolerance was noted as good.   Fall prevention strategies, including scheduled toileting and environmental modifications, were maintained throughout the shift.   dexAMETHasone  was administered in the afternoon as ordered.   Overall, safety interventions and therapy support were maintained, and no adverse events were documented during the shift.    Absence of Fall and Fall-Related Injury: Fall reduction interventions such as low bed, environmental modifications, and scheduled toileting were consistently maintained throughout the shift, and no falls or injuries were documented. Hourly visual checks and increased side rail use were also implemented to support safety.    Improved Ability to Complete Activities of Daily Living: Minimal assistance was required for mobility and self-care tasks, and occupational therapy provided self-care/home management training with good activity tolerance noted during the shift.

## 2024-07-07 NOTE — Progress Notes (Signed)
 CRANIAL NEUROSURGERY CONSULT PROGRESS NOTE    For questions/concerns regarding patients:  Mon-Fri 6 AM-6 PM, please page Cranial Neurosurgery Consult Service at [phone]  Sun-Thurs 6 PM-6 AM AND weekends/holidays, please page Neurosurgery Night Float/ On-Call Service        Requesting Service Attending Physician:  Lamar LELON Quale  Neurosurgery Attending of Record: Rosaria Lush, MD     Brief History of Present Illness  Frank Leach is a 73 y.o. 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 Autoimmune less likely given extent of necrosis and abscess less likely given well appearance.   - Plan for OR for L frontal craniotomy on 12/5 for resection of lesion  - PT/OT   - Steroids per oncology   ___________________________________________________________________    Neurological Exam  Eyes open spontaneously  Oriented to name, location, time  Pupils equal and reactive bilaterally  Extraocular movements intact bilaterally  Face symmetric  Tongue protrudes in the midline  Follows commands x 4  RUE 5  LUE 5  RLE 4+  LLE 5  No pronator drift  Sensation to light touch intact throughout    Problem List  Principal Problem:    Frontal mass of brain  Active Problems:    Hyperlipidemia    HTN (hypertension)    Kidney replaced by transplant ([facility]HS-HCC)    Bladder mass    Left leg weakness

## 2024-07-07 NOTE — Plan of Care (Signed)
 Shift Summary  Fall reduction program and bed alarm were maintained, with no documentation of falls or injuries.   Sepsis risk scores remained low and stable, and skin protection interventions were consistently applied.   Pain was assessed at 0 throughout the shift, and positioning devices were used.   Family visited and received updates during the shift.   Patient remained under supervision and safety interventions, with stable scores and no major changes documented.     Absence of Fall and Fall-Related Injury: Fall reduction program and bed alarm were consistently maintained throughout the shift, with hourly visual checks and supervision during mobility; no documentation of falls or injuries occurred. Safety devices for transfers were not applicable, and the room door was deferred to promote rest.     Absence of Hospital-Acquired Illness or Injury: Sepsis risk scores remained low and stable, and skin protection interventions such as limited adhesive use and incontinence pads were maintained. Anti-embolism devices were refused, and incentive spirometer was self-administered twice.     Optimal Comfort and Wellbeing: Pain was consistently assessed at 0, and positioning devices such as pillows were used throughout the shift.     Readiness for Transition of Care: Unplanned readmission score remained stable, and IRF and LTACH candidacy scores were documented early in the shift.     Rounds/Family Conference: Family visited and received updates throughout the shift.

## 2024-07-07 NOTE — Consults (Signed)
 Physical Medicine and Rehabilitation  Inpatient Consult Note    Requesting Attending Physician: Lamar Elsie Quale, MD  Service Requesting Consult: [facility] ([facility])  Thank you for this consult.  Please contact the PM&R consult pager [phone]) for questions regarding these recommendations.  For questions of bed availability at [facility], contact the Intake Office at [phone]. Please contact your case manager for updates on AIR process as they are in regular contact with the rehab intake office.    ASSESSMENT:   Frank Leach is a 73 y.o. 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    RECOMMENDATIONS:  #R-sided weakness ISO new brain mass  - Steroid taper  - Onc/Rad onc/neurosurgery/urology/palliative care involved  - Daily CBC/neuro exams  - Awaiting oncology plan    #RCC/DDKT/HTN  - Transplant nephrology involved: mycophenolate /olmesartan  on hold, tacro, amlodipine  per their recommendations    DISCHARGE RECOMMENDATIONS:  - Recommendation: ACUTE INPATIENT REHABILITATION (AIR).  - Patient/family agreed to [facility] AIR.    FOLLOW UP:  - Please place an Ambulatory referral by entering cancer rehab in Epic and select [facility] as the Department in the PM+R referral to schedule follow up with Dr. Adren Dollins.     AIR PLANNING:  *This PM&R consult does not guarantee that patient has been accepted to [facility] AIR, but can be helpful to guide patient progression*    Checklist For Potential Admission to [facility] AIR:  [ ]  EDD/what is estimated discharge date?  [ ]  Referral from CM if wants [place] AIR  [ ]  PT/OT within 48 hours of AIR  [ ]  dispo verified and [place] AIR preference  [ ]  CBC/BMP & other pertinent labs within 48 hours of AIR  [ ]  All SCI patients and those with hypercoagulable state (COVID, transplant, cancer, etc) need PVL BLE within one week prior to AIR  [ ]  Chemotherapy and biologics: Require AIR physician and nursing approval; Oral chemo and some infusions (Cytoxan) are often acceptable (M-F administration secondary to daytime nurse staffing).   The Intake Team will verify staffing availability with Nurse Management; IT chemo cannot be done at [facility] and those patients will need to stay at main until treatments are completed  [ ]  Chemotherapy plan documented  [ ]  Radiation therapy plan documented    Discharge Plan After AIR:  - patient lives with spouse in a 1 level home with 2STE.  - patient will have assistance from spouse after discharge.    In order for the patient to be considered for admission to [facility] inpatient rehab, the case manager must give the patient / family choice in post-acute discharge options. If the patient desires [place], a referral from case management is required for acceptance to [facility] inpatient rehab.  A referral for [facility] inpatient rehab has not been received.     Vena CHARLENA Lolling, MD  [facility] PM+R    SUBJECTIVE   Reason for Consult: Patient seen in consultation at the request of Lamar Elsie Quale, MD for evaluation of rehabilitation needs and recommendations.    History of Present Illness: Frank Leach is a 73 y.o. 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    Per review of EMR, he was admitted on 11/27 ISO worsening  mobility x 1-2 weeks PTA, including ambulation difficulty, RLE-weakness and found to have a new brain mass in L frontal lobe. He was evaluated by multiple teams and was started on steroids with improvement. His oncology planning is underway. He has participated in PT and OT and is recommended for further rehab.    Medical Complexity:  #R-sided weakness ISO new brain mass  - Steroid taper  - Onc/Rad onc/neurosurgery/urology/palliative care involved  - Daily CBC/neuro exams  - Awaiting oncology plan    #RCC/DDKT/HTN  - Transplant nephrology involved: mycophenolate /olmesartan  on hold, tacro, amlodipine  per their recommendations    #T2DM  - Home metformin  on hold  - BMP daily  - SSI PRN    # Impaired ADLs, fall risk, impaired functional gait, mobility, balance, and endurance  - Please continue to have patient work with PT and OT to maximize functional status with mobility and ADLs.  - Fall risk precautions and OOB to chair daily with assistance if appropriate  - Discussed rehab options today with patient. The patient/family/caregiver was counseled and educated on the purpose of AIR and questions/concerns were addressed.     Today  Patient was seen with family. He denies any pain, nausea/emesis or bowel/bladder issues. He does feel his R-sided weakness has improved with steroids.    Past Medical and Surgical History:   Past Medical History[1]  Past Surgical History[2]     Cancer History:  Hematology/Oncology History    No problem history exists.       Social History:   Short Social History[3]    Social History     Social History Narrative    Not on file       Living Environment: House  Lives With: Spouse  Home Living: One level home, Tub/shower unit, Hand-held shower hose, Handicapped height toilet, Stairs to enter without rails, Shower chair with back, Stairs to alternate level with rails, Grab bars in shower, Grab bars around toilet  Rail placement (outside): Bilateral rails in reach  Number of Stairs to Enter (outside): 2  Rail placement (inside): Rail on right side  Number of Stairs to Alternate level (inside): 2  Caregiver Identified?: Yes  Caregiver Availability: Intermittent  Caregiver Ability: Limited lifting    Family History: Reviewed, noncontributory to rehab  family history includes Cancer in his father; Kidney disease in his brother and mother.    Allergies:   Patient has no known allergies.    Medications:   Scheduled Scheduled meds with Route[4]  PRN acetaminophen , 650 mg, Q6H PRN  aluminum-magnesium hydroxide-simethicone, 30 mL, Q4H PRN  dextrose  in water , 12.5 g, Q15 Min PRN  glucagon, 1 mg, Once PRN  glucose, 16 g, Q10 Min PRN  guaiFENesin, 200 mg, Q4H PRN  melatonin, 3 mg, Nightly PRN      Continuous Infusions      Review of Systems:    General ROS: negative      Prior Functional Status:  Prior Functional Status: At baseline, pt is independent with mobility and ADL.  Pt is a retired arboriculturist.  Pt denied falls or any DME use at baseline.  Pt enjoys walking every morning and was recently blowing his and his neighbors leaves.    Current Functional Status: Therapy notes reviewed     Mobility:   Bed Mobility comments: increased time        Ambulation comments: Pt amb with slow gait speed initially with wide BOS and very short steps while utilizing only HHA and min A.  Pt given RW and was able to progress to CGA, however required intermittent assist for RW management and obstacle avoidance  PT Post Acute Discharge Recommendations: 5x weekly, High intensity (progress)    Cognition, Swallow, Speech:   Cognition / Swallow / Speech  Patient's Vision Adequate to Safely Complete Daily Activities: Yes  Patient's Judgement Adequate to Safely Complete Daily Activities: Yes  Patient's Memory Adequate to Safely Complete Daily Activities: Yes  Patient Able to Express Needs/Desires: Yes  Patient has speech problem: No             Assistive Devices: Bedside commode, Rolling walker, Straight cane    Precautions:  Safety Interventions  Safety Interventions: low bed, family at bedside    OBJECTIVE:   Vitals:  Vitals:    07/05/24 2345 07/06/24 0756 07/06/24 1638 07/06/24 2356   BP: 134/75 154/80 151/81 148/83   Pulse: 59 (!) 49 58 56   Resp: 18 18 18 16    Temp: 36.9 ??C (98.4 ??F) 36.4 ??C (97.5 ??F) 36.5 ??C (97.7 ??F) 36.5 ??C (97.7 ??F)   TempSrc: Oral Oral Oral Oral   SpO2: 98% 98% 96% 93%   Weight:       Height:           Physical Exam:    GEN: Lying in bed in NAD.  HEENT: Atraumatic. Normocephalic. Moist mucous membranes. Trachea midline.  RESP: NWOB on RA.  CV: Warm and well-perfused.  PSYCH: Appropriate  SKIN: see wound care flow sheet    NEURO:  Mental Status: A&Ox3 (person, place, date), attention intact, speech fluid and coherent, follows commands well and answers questions appropriately  Cranial Nerve: Extra ocular movements intact. No facial droop. Hearing grossly intact bilaterally. Tongue protrudes midline.  Motor:      Delt Bi Tri HG   (Delt= Deltoid, Bi= Biceps, Tri= Triceps, WE= Wrist extensor, HG= Hand Grip)  RUE 4 5 4 5      LUE 5 5 5 5                    HF KE       DF       PF    (HF= Hip flexor, KE= Knee Extensor, DF= Dorsiflexor, PF= Plantar Flexor)  RLE     4 5 5 5               LLE   5 5 5 5        Labs and Diagnostic Studies:    Personally reviewed   CBC - Results in Past 30 Days  Result Component Current Result Ref Range Previous Result Ref Range   HCT 37.4 (L) (07/07/2024) 39.0 - 48.0 % 37.8 (L) (07/06/2024) 39.0 - 48.0 %   HGB 12.5 (L) (07/07/2024) 12.9 - 16.5 g/dL 88.0 (L) (88/69/7974) 87.0 - 16.5 g/dL   MCH 70.2 (87/03/7973) 25.9 - 32.4 pg 28.5 (07/06/2024) 25.9 - 32.4 pg   MCHC 33.3 (07/07/2024) 32.0 - 36.0 g/dL 68.3 (L) (88/69/7974) 67.9 - 36.0 g/dL   MCV 10.6 (87/03/7973) 77.6 - 95.7 fL 90.4 (07/06/2024) 77.6 - 95.7 fL   MPV 8.9 (07/07/2024) 6.8 - 10.7 fL 8.9 (07/06/2024) 6.8 - 10.7 fL   Platelet 200 (07/07/2024) 150 - 450 10*9/L 252 (07/06/2024) 150 - 450 10*9/L   RBC 4.19 (L) (07/07/2024) 4.26 - 5.60 10*12/L 4.18 (L) (07/06/2024) 4.26 - 5.60 10*12/L   WBC 10.5 (07/07/2024) 3.6 - 11.2 10*9/L 10.0 (07/06/2024) 3.6 - 11.2 10*9/L     BMP - Results  in Past 30 Days  Result Component Current Result Ref Range Previous Result Ref Range   BUN 37 (H) (07/07/2024) 9 - 23 mg/dL 30 (H) (88/69/7974) 9 - 23 mg/dL   Chloride 890 (H) (87/03/7973) 98 - 107 mmol/L 109 (H) (07/06/2024) 98 - 107 mmol/L   CO2 20.0 (07/07/2024) 20.0 - 31.0 mmol/L 20.0 (07/06/2024) 20.0 - 31.0 mmol/L   Creatinine 1.77 (H) (07/07/2024) 0.73 - 1.18 mg/dL 8.09 (H) (88/69/7974) 9.26 - 1.18 mg/dL   Glucose 749 (H) (87/03/7973) 70 - 179 mg/dL 777 (H) (88/69/7974) 70 - 179 mg/dL   Potassium 5.2 (H) (87/03/7973) 3.4 - 4.8 mmol/L 5.4 (H) (07/06/2024) 3.4 - 4.8 mmol/L   Sodium 141 (07/07/2024) 135 - 145 mmol/L 141 (07/06/2024) 135 - 145 mmol/L     LFT's - Results in Past 30 Days  Result Component Current Result Ref Range Previous Result Ref Range   Albumin 4.1 (07/03/2024) 3.4 - 5.0 g/dL Not in Time Range    Alkaline Phosphatase 78 (07/03/2024) 46 - 116 U/L Not in Time Range    ALT 17 (07/03/2024) 10 - 49 U/L Not in Time Range    AST 20 (07/03/2024) <=34 U/L Not in Time Range    Total Bilirubin 0.2 (L) (07/03/2024) 0.3 - 1.2 mg/dL Not in Time Range        Radiology Results:    Personally reviewed MRI brain from 07/04/24 - mass noted in L frontal lobe; agree with report as below:  Impression   Peripherally enhancing mass within the left frontal lobe measuring up to 4.4 cm, concerning for malignancy.             This encounter was an inpatient consultation.         [1]   Past Medical History:  Diagnosis Date    Anemia     Cancer    (CMS-HCC)     clear cell adenocarcinoma of left kidney    Diabetes mellitus (CMS-HCC)     Diabetic nephropathy (CMS-HCC)     ESRD (end stage renal disease) on dialysis    (CMS-HCC)     ESRD on dialysis (CMS-HCC)     Gout     Hyperlipidemia     Hypertension    [2]   Past Surgical History:  Procedure Laterality Date    AV FISTULA PLACEMENT      PR COLONOSCOPY FLX DX W/COLLJ SPEC WHEN PFRMD N/A 04/30/2018    Procedure: COLONOSCOPY, FLEXIBLE, PROXIMAL TO SPLENIC FLEXURE; DIAGNOSTIC, W/WO COLLECTION SPECIMEN BY BRUSH OR WASH;  Surgeon: Dorn Lynwood Lauth, MD;  Location: [facility];  Service: Gastroenterology    PR COLONOSCOPY W/BIOPSY SINGLE/MULTIPLE N/A 04/30/2018    Procedure: COLONOSCOPY, FLEXIBLE, PROXIMAL TO SPLENIC FLEXURE; WITH BIOPSY, SINGLE OR MULTIPLE;  Surgeon: Dorn Lynwood Lauth, MD;  Location: [facility];  Service: Gastroenterology    PR COLSC FLX W/RMVL OF TUMOR POLYP LESION SNARE TQ N/A 04/30/2018    Procedure: COLONOSCOPY FLEX; W/REMOV TUMOR/LES BY SNARE;  Surgeon: Dorn Lynwood Lauth, MD;  Location: [facility];  Service: Gastroenterology    PR EXPLORATORY OF ABDOMEN Midline 04/14/2016    Procedure: EXPLORATORY LAPAROTOMY, EXPLORATORY CELIOTOMY WITH OR WITHOUT BIOPSY(S);  Surgeon: Marsa Sam Boss, MD;  Location: MAIN OR [facility];  Service: Transplant    PR EXPLORATORY OF ABDOMEN N/A 04/16/2016    Procedure: EXPLORATORY LAPAROTOMY, EXPLORATORY CELIOTOMY WITH OR WITHOUT BIOPSY(S);  Surgeon: Marsa Sam Boss, MD;  Location: MAIN OR [facility];  Service: Transplant  PR LAP, RADICAL NEPHRECTOMY Right 12/27/2016    Procedure: Robotic Xi Laparoscopy; Radical Nephrectomy (Incl Remove Gerota`S Fascia, Fatty Tissue, Reg Lymph Node, Adrenalectomy);  Surgeon: Clorinda Ole Chalk, MD;  Location: MAIN OR [facility];  Service: Urology    PR LIGATN ANGIOACCESS AV FISTULA Left 11/29/2022    Procedure: LIGATION OR BANDING OF ANGIOACCESS ARTERIOVENOUS FISTULA;  Surgeon: [place], Marsa Messier, MD;  Location: MAIN OR [place];  Service: Transplant    PR NEGATIVE PRESSURE WOUND THERAPY DME >50 SQ CM N/A 04/12/2016    Procedure: NEG PRESS WOUND TX (VAC ASSIST) INCL TOPICALS, PER SESSION, TSA GREATER THAN/= 50 CM SQUARED;  Surgeon: Marsa Messier Boss, MD;  Location: MAIN OR [place];  Service: Transplant    PR NEGATIVE PRESSURE WOUND THERAPY DME >50 SQ CM Bilateral 04/14/2016    Procedure: NEG PRESS WOUND TX (VAC ASSIST) INCL TOPICALS, PER SESSION, TSA GREATER THAN/= 50 CM SQUARED;  Surgeon: Marsa Messier Boss, MD;  Location: MAIN OR [place];  Service: Transplant    PR NEGATIVE PRESSURE WOUND THERAPY DME >50 SQ CM N/A 04/16/2016    Procedure: NEG PRESS WOUND TX (VAC ASSIST) INCL TOPICALS, PER SESSION, TSA GREATER THAN/= 50 CM SQUARED;  Surgeon: Marsa Messier Boss, MD;  Location: MAIN OR [facility];  Service: Transplant    PR NEPHRECTOMY, W/PART. URETECTOMY Bilateral 04/11/2016    Procedure: LAPAROSCOPY, SURGICAL, NEPHRECTOMY WITH TOTAL URETERECTOMY;  Surgeon: Marsa Messier Boss, MD;  Location: MAIN OR [facility];  Service: Transplant    PR REMV KIDNEY,W/RIB RESECTION Bilateral 04/11/2016    Procedure: NEPHRECTOMY, INCLUDING PARTIAL URETERECTOMY, ANY OPEN APPROACH INCLUDING RIB RESECTION;  Surgeon: Marsa Messier Boss, MD;  Location: MAIN OR [facility];  Service: Transplant    PR TRANSPLANT,PREP LIVING  RENAL GRAFT N/A 06/22/2019    Procedure: BACKBENCH STD PREP LIVING DONOR RENAL ALLGRFT (OPEN/LAPROSC) PRIOR TO TRANSPLANT, INC DISSECT/REM AS NECESS;  Surgeon: Marsa Messier Boss, MD;  Location: MAIN OR [facility];  Service: Transplant    PR TRANSPLANTATION OF KIDNEY N/A 06/22/2019    Procedure: RENAL ALLOTRANSPLANTATION, IMPLANTATION OF GRAFT; WITHOUT RECIPIENT NEPHRECTOMY;  Surgeon: Marsa Messier Boss, MD;  Location: MAIN OR [facility];  Service: Transplant    SPLENECTOMY     [3]   Social History  Tobacco Use    Smoking status: Never    Smokeless tobacco: Never   Vaping Use    Vaping status: Never Used   Substance Use Topics    Alcohol use: No    Drug use: No   [4]    allopurinol  (ZYLOPRIM ) tablet 100 mg QPM    atorvastatin  (LIPITOR ) tablet 40 mg Daily    insulin  lispro (HumaLOG ) injection CORRECTIONAL 0-20 Units ACHS    polyethylene glycol (MIRALAX ) packet 17 g Daily    senna (SENOKOT) tablet 1 tablet BID    tacrolimus  (ENVARSUS  XR) extended release tablet 5 mg Daily    [Provider Hold] valsartan (DIOVAN) tablet 160 mg Daily

## 2024-07-07 NOTE — Progress Notes (Signed)
 Internal Medicine (MEDL) Progress Note    Assessment & Plan:   Frank Leach is a 73 y.o. 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    Principal Problem:    Frontal mass of brain  Active Problems:    Hyperlipidemia    HTN (hypertension)    Kidney replaced by transplant (HHS-HCC)    Bladder mass    Left leg weakness        Active Problems    Left frontal lobe brain mass  Right sided weakness  Concern for new malignancy of unclear origin  Patient presented with 1 week of progressive right sided weakness, with no other neurologic issues or deficits, such as headache, dizziness, double vision, or confusion.  MRI brain with peripherally enhancing mass within left frontal lobe measuring 4.4 cm with vasogenic edema, concerning for malignancy (primary CNS vs less likely metastatic urothelial). Still with significant right sided weakness, though stable from yesterday on PO dex.   - Neurosurgery consulted, appreciate recommendations   - Planning for biopsy of lesion vs resection on Friday 12/5  - Continue PO dexamethasone  taper: 4 mg q8 x3 days then 4q12 x3 days then 4 qd x3 days then 2 mg x3 days then 1 mg daily x3 days then stop (11/27- 12/13 anticipated)  - Daily neurologic exams  - Daily CBC  - Recommended for AIR   - Under evaluation  - Medical oncology consulted, appreciate recommendations   - Recommend to replace outpatient referral on discharge  - Radiation oncology consulted appreciate recommendations   - -Recommend to replace outpatient referral on discharge  - Urology consulted as below, appreciate recommendations   - Recommend to replace outpatient referral on discharge  - Palliative care consulted, appreciate connection to resources for psychosocial support   -- Recommend to replace outpatient referral on discharge        Bladder mass  Concern for new malignancy of unclear origin  CT abdomen pelvis on admission with bilobed mass or adjacent synchronous masses in the right anterior bladder wall, 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 High concern for concern for urothelial carcinoma of the bladder vs recurrent RCC.  - Urology consulted appreciate recommendations              - No urgent urologic intervention indicated              - Plan for outpatient cystoscopy and transurethral resection of bladder tumor/bladder biopsy for tissue diagnosis  - Daily BMP     History of renal cell carcinoma status post bilateral nephrectomy  Deceased donor kidney transplant  Hypertension  Bilateral nephrectomy in 07-23-2016 and 2017/07/23 secondary to RCC. Deceased donor kidney transplant in July 24, 2019.  Followed closely by Dr. [name] with [facility] transplant nephrology. Creatinine continues within baseline. Ongoing conversation with neurosurgery regarding holding tac post-operatively.    - Transplant nephrology consulted, appreciate recommendations              - Hold mycophenolate               - Continue tacrolimus  5 mg daily, check tacrolimus  24-hour trough levels and target levels of 4 to 6 ng/mL              - Continue home amlodipine  10 mg daily              -  Hold home olmesartan  40 mg daily     Type 2 diabetes mellitus  Steroid induced hyperglycemia  Hemoglobin A1c 6.8  - Hold home metformin  500 mg daily  - Daily BMP  - Added SSI today (hyperglycemic likely secondary to steroids)        Chronic Problems    Hyperlipidemia  Continue atorvastatin  40 mg daily      Daily Checklist:  Diet: Regular Diet  DVT PPx: SCDs  Electrolytes: Replete Potassium to >/= 3.6 and Magnesium to >/= 1.8  Code Status: Full Code  Dispo: Admit to observation    Team Contact Information:   Primary Team: Internal Medicine (MEDL)  Primary Resident: Kip Brand, MD, MD  Resident's Pager: [phone] (Gen MedL Intern - [place])    Interval History:   No acute events overnight.    Patient slept well and was eating breakfast at time of interview. Feels his weakness is about the same as yesterday, but walked one lap with his nurse yesterday and felt ok. Aiming for two laps today.     ROS: Denies headache, chest pain, shortness of breath, abdominal pain, nausea, vomiting.    Objective:     Gen: NAD, converses   Heart: RRR  Lungs: CTAB, no crackles or wheezes  Abdomen: Soft, NTND  Neuro: 4/5 strength RLE extension and flexion at hip, knees, ankle  Skin:  No rashes, lesions on clothed exam  Psych: Alert, oriented    Kip Brand, MD  PGY-1 Internal Medicine  [facility]

## 2024-07-07 NOTE — Consults (Signed)
 [name] is a 73 yo male with RCC s/p bilateral nephrectomy s/p DDKT 2020 admitted with concern for new malignancy (bladder with potential brain met). Work-up in progress.    Primary team will refer to outpatient palliative care for psychosocial support and goals of care discussion pending work-up and further outpatient oncology evaluation of treatment options and prognosis.   There are no acute inpatient palliative care needs at this time. We are happy to engage if anything changes.    Frank Leach, Frank Leach  [facility]

## 2024-07-07 NOTE — Consults (Signed)
 Transplant Nephrology Consult     Requesting Attending Physician :  Lamar Elsie Quale, MD  Service Requesting Consult : [facility])  Reason for Consult: kidney transplant recipient, assistance with immunosuppression management    Assessment and Plan:     # S/p Kidney Transplant, Kidney allograft function (stable):  - Serum creatinine level was 1.79 on 11/27, which is within his usual baseline of approximately 1.7-1.9. The patient reports non-oliguric urine output and no symptoms with urination. Will continue to monitor with you.    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 It is more likely that his brain lesion is the etiology of his difficulty walking and the tremors. Appreciate Neurosurgery assistance with case. The patient is being treated with dexamethasone .  -Urology consultation re: bladder mass tissue sampling. Of note, this bladder mass was not reported to be visualized on his 04/16/24 native+transplant renal ultrasound.    # Immunosuppression:  - due to likely malignancy, recommend discontinuation of mycophenolate   - Continue Envarsus  at current dose of 5 mg daily  - Please obtain trough tacrolimus  trough levels prior to the morning dose of the medication and approximately 24hrs after prior dose. The  goal tacrolimus  level is 4-6 ng/mL.  - Once the patient has completed his course of dexamethasone , would start prednisone  5 mg daily.    # Blood Pressure / Volume:  - nomotensive this morning. No new interventions.  - Trend BP--if he becomes hypertensive, can resume his home amlodipine10 mg daily  - Hold home olmesartan         RECOMMENDATIONS:   - Hold olmesartan . Can resume amlodipine  at home dose of 10 mg daily if BP trends higher  - Would continue Envarsus  as patient would be high risk for kidney rejection if this is discontinued.  Given malignancy discontinued Mycophenolate .    - Check tacrolimus  24hr trough levels and target levels of 4-6 ng/mL  - Transplant patients with an open wound require wound care with sterile water  only. The patient should be counseled on this at the time of discharge if they have not already been doing this.  - We will continue to follow.     Kahliyah Dick Zayante, DO  07/07/2024 7:24 AM     Medical decision-making for 07/07/24  Findings / Data     Patient has: []  acute illness w/systemic sxs  [mod]  []  two or more stable chronic illnesses [mod]  []  one chronic illness with acute exacerbation [mod]  []  acute complicated illness  [mod]  []  Undiagnosed new problem with uncertain prognosis  [mod] [x]  illness posing risk to life or bodily function (ex. AKI)  [high]  []  chronic illness with severe exacerbation/progression  [high]  []  chronic illness with severe side effects of treatment  [high] Kidney transplant recipient, brain mass, bladder mass, difficulty with ambulation Probs At least 2:  Probs, Data, Risk   I reviewed: []  primary team note  []  consultant note(s)  []  external records [x]  chemistry results  [x]  CBC results  []  blood gas results  []  Other []  procedure/op note(s)   [x]  radiology report(s)  []  micro result(s)  []  w/ independent historian(s) Cr at  baseline, reviewed CT abd/pelvis and MRI brain (see above) >=3 Data Review (2 of 3)    I independently interpreted: []  Urine Sediment  []  Renal US  []  CXR Images  []  CT Images  []  Other []  EKG Tracing  Any     I discussed: []  Pathology results w/ QHPs(s) from other specialties  []  Procedural findings w/ QHPs(s) from other specialties []  Imaging w/ QHP(s) from other specialties  [x]  Treatment plan w/ QHP(s) from other specialties Plan discussed with primary team Any     Mgm't requires: []  Prescription drug(s)  [mod]  []  Kidney biopsy  [mod]  []  Central line placement  [mod] [x]  High risk medication use and/or intensive toxicity monitoring [high]  []  Renal replacement therapy [high]  []  High risk kidney biopsy  [high]  []  Escalation of care  [high]  []  High risk central line placement  [high] Immunosuppression: high risk for infection Risk      _____________________________________________________________________________________    Interval History:  No acute events overnight.  He denies any acute complaints this morning.  Blood pressures have been high in the past 24 hours.  There is no documented I/Os in the past 24 hours.  He denies any nausea, vomiting, diarrhea, or worse lower extremity edema.    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 Nephrology has been consulted for immunosuppression management.     The patient tells me he had about two weeks of difficulty walking (was dragging his right leg) and yesterday after thanksgiving dinner he was unable to stand on his own, so came to ED. He also notes that he has been having increased upper extremity tremors randomly through the day that come and go. He denies other symptoms.    In the ED initial CT head showed vasogenic edema in the left frontal lobe concerning for mass lesion.  An MRI was obtained that revealed a 4.4 cm left frontal lobe enhancing mass concerning for malignancy. 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 Laboratory data showed Cr stable at baseline and tacrolimus  evening trough level of 7.6 ng/mL    INPATIENT MEDICATIONS:  Current Medications[1]    Physical Exam:   Vitals:    07/05/24 2345 07/06/24 0756 07/06/24 1638 07/06/24 2356   BP: 134/75 154/80 151/81 148/83   Pulse: 59 (!) 49 58 56   Resp: 18 18 18 16    Temp: 36.9 ??C (98.4 ??F) 36.4 ??C (97.5 ??F) 36.5 ??C (97.7 ??F) 36.5 ??C (97.7 ??F)   TempSrc: Oral Oral Oral Oral   SpO2: 98%  98% 96% 93%   Weight:       Height:         No intake/output data recorded.  No intake or output data in the 24 hours ending 07/07/24 0724      Constitutional: no acute distress  Heart: regular rate and rhythm, no murmurs, rubs, or gallops  Lungs: clear to auscultation bilaterally without adventitious sounds  Abd: soft, non-tender, non-distended  Ext: no lower extremity edema    Neuro: awake, alert, oriented x3         [1]   Current Facility-Administered Medications:     acetaminophen  (TYLENOL ) tablet 650 mg, Oral, Q6H PRN    allopurinol  (ZYLOPRIM ) tablet 100 mg, Oral, QPM    aluminum-magnesium hydroxide-simethicone (MAALOX MAX) 80-80-8 mg/mL oral suspension, Oral, Q4H PRN    atorvastatin  (LIPITOR ) tablet 40 mg, Oral, Daily    [COMPLETED] dexAMETHasone  (DECADRON ) tablet 4 mg, Oral, Q8H SCH **FOLLOWED BY** dexAMETHasone  (DECADRON ) tablet 4 mg, Oral, Q12H SCH **FOLLOWED BY** [START ON 07/11/2024] dexAMETHasone  (DECADRON ) tablet 4 mg, Oral, Daily **FOLLOWED BY** [START ON 07/14/2024] dexAMETHasone  (DECADRON ) tablet 2 mg, Oral, Daily **FOLLOWED BY** [START ON 07/17/2024] dexAMETHasone  (DECADRON ) tablet 1 mg, Oral, Daily    dextrose  50 % in water  (D50W) 50 % solution 12.5 g, Intravenous, Q15 Min PRN    glucagon injection 1 mg, Intramuscular, Once PRN    glucose chewable tablet 16 g, Oral, Q10 Min PRN    guaiFENesin (ROBITUSSIN) oral syrup, Oral, Q4H PRN    insulin  lispro (HumaLOG ) injection CORRECTIONAL 0-20 Units, Subcutaneous, ACHS    melatonin tablet 3 mg, Oral, Nightly PRN    polyethylene glycol (MIRALAX ) packet 17 g, Oral, Daily    senna (SENOKOT) tablet 1 tablet, Oral, BID    tacrolimus  (ENVARSUS  XR) extended release tablet 5 mg, Oral, Daily    [Provider Hold] valsartan (DIOVAN) tablet 160 mg, Oral, Daily

## 2024-07-07 NOTE — Consults (Signed)
 Acute Inpatient Rehab referral has been received and patient is currently under review.       Please be advised, [facility] is now located at the [facility].      Visitation Policy:  [facility] allows visitors (6 AM- 9 PM) and one visitor may be designated to stay overnight. The overnight visitor must be in the facility prior to 9 PM.       Burnard Bodo, MS, CCC-SLP  [facility]  Inpatient Coordinator  Office: [phone]    2:12 PM 07/07/2024      Admission Criteria for Acute Inpatient Rehab:     The patient has medical need that will require daily physician oversight.  The patient requires at least two therapy disciplines (PT, OT, ST) at a high frequency.  It is anticipated that the patient will make significant functional improvement in a reasonable length of time.   The patient is able to participate in and tolerate a minimum of three hours of therapy per day.   The patient or his/her representative consents to the admission.  The patient/family has realistic goals that include discharge to a community setting (other than SNF) and has adequate assistance at home.

## 2024-07-08 LAB — BASIC METABOLIC PANEL
ANION GAP: 9 mmol/L (ref 5–14)
BLOOD UREA NITROGEN: 36 mg/dL — ABNORMAL HIGH (ref 9–23)
BUN / CREAT RATIO: 20
CALCIUM: 9.6 mg/dL (ref 8.7–10.4)
CHLORIDE: 110 mmol/L — ABNORMAL HIGH (ref 98–107)
CO2: 21 mmol/L (ref 20.0–31.0)
CREATININE: 1.82 mg/dL — ABNORMAL HIGH (ref 0.73–1.18)
EGFR CKD-EPI (2021) MALE: 39 mL/min/1.73m2 — ABNORMAL LOW (ref >=60)
GLUCOSE RANDOM: 198 mg/dL — ABNORMAL HIGH (ref 70–179)
POTASSIUM: 4.9 mmol/L — ABNORMAL HIGH (ref 3.4–4.8)
SODIUM: 140 mmol/L (ref 135–145)

## 2024-07-08 LAB — CBC
HEMATOCRIT: 36.7 % — ABNORMAL LOW (ref 39.0–48.0)
HEMOGLOBIN: 12 g/dL — ABNORMAL LOW (ref 12.9–16.5)
MEAN CORPUSCULAR HEMOGLOBIN CONC: 32.8 g/dL (ref 32.0–36.0)
MEAN CORPUSCULAR HEMOGLOBIN: 29 pg (ref 25.9–32.4)
MEAN CORPUSCULAR VOLUME: 88.5 fL (ref 77.6–95.7)
MEAN PLATELET VOLUME: 9.2 fL (ref 6.8–10.7)
PLATELET COUNT: 199 10*9/L (ref 150–450)
RED BLOOD CELL COUNT: 4.14 10*12/L — ABNORMAL LOW (ref 4.26–5.60)
RED CELL DISTRIBUTION WIDTH: 15.3 % — ABNORMAL HIGH (ref 12.2–15.2)
WBC ADJUSTED: 11.7 10*9/L — ABNORMAL HIGH (ref 3.6–11.2)

## 2024-07-08 MED ORDER — DEXAMETHASONE 1 MG TABLET
ORAL_TABLET | ORAL | 0 refills | 12.00000 days | Status: CP
Start: 2024-07-08 — End: 2024-07-20
  Filled 2024-07-09: qty 45, 12d supply, fill #0

## 2024-07-08 MED ORDER — FLU VACC 2025-26(65YR UP)-MF59C(PF) 45 MCG(15 MCGX3)/0.5 ML IM SYRINGE
Freq: Once | INTRAMUSCULAR | 0 refills | 1.00000 days | Status: CP
Start: 2024-07-08 — End: 2024-07-08

## 2024-07-08 MED ORDER — ENVARSUS XR 4 MG TABLET,EXTENDED RELEASE
ORAL_TABLET | Freq: Every day | ORAL | 3 refills | 90.00000 days | Status: CP
Start: 2024-07-08 — End: ?

## 2024-07-08 MED ADMIN — tacrolimus (ENVARSUS XR) extended release tablet 5 mg: 5 mg | ORAL | @ 14:00:00 | Stop: 2024-07-08

## 2024-07-08 MED ADMIN — senna (SENOKOT) tablet 1 tablet: 1 | ORAL | @ 14:00:00 | Stop: 2024-07-08

## 2024-07-08 MED ADMIN — atorvastatin (LIPITOR) tablet 40 mg: 40 mg | ORAL | @ 14:00:00 | Stop: 2024-07-08

## 2024-07-08 MED ADMIN — polyethylene glycol (MIRALAX) packet 17 g: 17 g | ORAL | @ 14:00:00 | Stop: 2024-07-08

## 2024-07-08 MED ADMIN — insulin lispro (HumaLOG) injection CORRECTIONAL 0-20 Units: 0-20 [IU] | SUBCUTANEOUS | @ 17:00:00 | Stop: 2024-07-08

## 2024-07-08 MED ADMIN — insulin lispro (HumaLOG) injection CORRECTIONAL 0-20 Units: 0-20 [IU] | SUBCUTANEOUS | @ 03:00:00

## 2024-07-08 MED ADMIN — insulin lispro (HumaLOG) injection CORRECTIONAL 0-20 Units: 0-20 [IU] | SUBCUTANEOUS | @ 22:00:00 | Stop: 2024-07-08

## 2024-07-08 MED ADMIN — dexAMETHasone (DECADRON) tablet 4 mg: 4 mg | ORAL | @ 14:00:00 | Stop: 2024-07-08

## 2024-07-08 MED ADMIN — dexAMETHasone (DECADRON) tablet 4 mg: 4 mg | ORAL | @ 22:00:00 | Stop: 2024-07-08

## 2024-07-08 MED ADMIN — flu vacc 2025-26 (65 yr up) (PF)(FLUAD)45 mcg(15mcgx3)/0.5 ml IM syringe: .5 mL | INTRAMUSCULAR | @ 23:00:00 | Stop: 2024-07-08

## 2024-07-08 NOTE — Plan of Care (Signed)
 Shift Summary  Correctional insulin  administered after elevated blood glucose was detected, with subsequent labs showing persistently high glucose.  Pain remained at 0 throughout the shift, and senna was refused due to frequent stools.  Fall prevention measures, including low bed, side rails, and hourly checks, were maintained with no reported falls or injuries.  Family was present and supportive during the shift, and patient was accompanied by a family member during transport.  Patient was able to turn self in bed and walk occasionally, though general weakness and slightly limited mobility were noted.    Absence of Fall and Fall-Related Injury: Fall reduction interventions, including low bed positioning, use of pillows, and hourly visual checks, were consistently maintained throughout the shift, with no reported falls or injuries. Family presence and side rails were also maintained to support safety.    Optimal Comfort and Wellbeing: Pain was consistently assessed and remained at 0 throughout the shift, and patient reported liquid stools, declining senna administration; comfort measures were maintained.    Improved Ability to Complete Activities of Daily Living: Patient demonstrated occasional walking and was able to turn self in bed, though general weakness was noted and mobility remained slightly limited.

## 2024-07-08 NOTE — Telephone Encounter (Signed)
 UROLOGY FOLLOW UP REQUEST    Requesting physician/contact for questions: Comer Sou, MD      Date(s) Needed: In 1 month    Appt to be scheduled under: Onc APPs or any available provider     Type of Appt: New patient - Incidentally found right anterior bladder wall mass on CT imaging concerning for malignancy during hospitalization for RLE weakness     Need to contact patient: Yes please    Special Requests/Instructions: None    Orders placed for Special Requests: N/A

## 2024-07-08 NOTE — Consults (Signed)
 Care Management  Initial Transition Planning Assessment    CM talked with patient at bedside to complete assessment as well as to confirm all information pertaining to any discharge planning needs. CM introduced self, explained role, and confirmed demographic information.Patient lives with Takari Lundahl (spouse) in [facility]) in a 1 level home with 2 steps to enter.  At baseline patient is independent with ADLs. He does not receive home health. DME is straight cane, standard walker, grab bars in bathroom, shower chair.  Spouse or daughter  will provide transportation and will assist with basic care at home.    Type of Residence: Mailing Address:  [address]  [place] [address]  Contacts: Accompanied by: Family member  Patient Phone Number: [phone] (mobile)         Medical Provider(s): Kandis Stefano [place], MD  Reason for Admission: Admitting Diagnosis:  Right sided weakness [R53.1]  Past Medical History:   has a past medical history of Anemia, Cancer    (CMS-HCC), Diabetes mellitus (CMS-HCC), Diabetic nephropathy (CMS-HCC), ESRD (end stage renal disease) on dialysis    (CMS-HCC), ESRD on dialysis (CMS-HCC), Gout, Hyperlipidemia, and Hypertension.  Past Surgical History:   has a past surgical history that includes AV fistula placement; pr negative pressure wound therapy dme >50 sq cm (N/A, 04/12/2016); pr nephrectomy, w/part. uretectomy (Bilateral, 04/11/2016); pr remv kidney,w/rib resection (Bilateral, 04/11/2016); pr exploratory of abdomen (Midline, 04/14/2016); pr negative pressure wound therapy dme >50 sq cm (Bilateral, 04/14/2016); pr negative pressure wound therapy dme >50 sq cm (N/A, 04/16/2016); pr exploratory of abdomen (N/A, 04/16/2016); pr lap, radical nephrectomy (Right, 12/27/2016); Splenectomy; pr colonoscopy flx dx w/collj spec when pfrmd (N/A, 04/30/2018); pr colsc flx w/rmvl of tumor polyp lesion snare tq (N/A, 04/30/2018); pr colonoscopy w/biopsy single/multiple (N/A, 04/30/2018); pr transplantation of kidney (N/A, 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   Previous admit date: 06/21/2019    Primary Insurance- Payor: [employer] ADV / Plan: [employer] MEDICARE ADV / Product Type: *No Product type* /   Secondary Insurance - None  Prescription Coverage - N/A  Preferred Pharmacy - [facility]  [facility]  [facility] [place] - [address]  [place] [facility]  [id number] - [place], [place] - [address] [address]  [facility] - [place] - [address]    Transportation home: Energy Manager / Social Worker assessed the patient by : In person interview with patient  Orientation Level: Oriented X4  Functional level prior to admission: Independent  Reason for referral: Discharge Planning    Contact/Decision Maker  Extended Emergency Contact Information  Primary Emergency Contact: Currier,Shirley   [place]  Home Phone: [phone]  Mobile Phone: [phone]  Relation: Spouse  Interpreter needed? No  Secondary Emergency Contact: Parquet,Cheryl   [place]  Mobile Phone: [phone]  Relation: Daughter    Legal Next of Kin / Guardian / POA / Advance Directives     HCDM (patient stated preference): Urquilla,Shirley - Spouse - [phone]    Advance Directive (Medical Treatment)  Does patient have an advance directive covering medical treatment?: Patient has advance directive covering medical treatment, copy in chart.  Reason patient does not  have an advance directive covering medical treatment:: Patient does not wish to complete one at this time.    Health Care Decision Maker [HCDM] (Medical & Mental Health Treatment)  Healthcare Decision Maker: HCDM documented in the HCDM/Contact Info section.  Information offered on HCDM, Medical & Mental Health advance directives:: Patient declined information.    Advance Directive (Mental Health Treatment)  Does patient have an advance directive covering mental health treatment?: Patient does not have advance directive covering mental health treatment.    Readmission Information    Have you been hospitalized in the last 30 days?: No        Patient Information  Lives with: Spouse/significant other Rayen Dafoe (spouse))    Type of Residence: Private residence        Location/Detail: Patient address [address]  [facility] [place] [address]    Support Systems/Concerns: Spouse, Children, Family Members Tahje Borawski (spouse), Channing Bracken (daughter), and Aimee Timmons (sister))    Responsibilities/Dependents at home?: No    Home Care services in place prior to admission?: No           Equipment Currently Used at Home: shower chair, cane, straight, walker, standard, grab bar, tub/shower       Currently receiving outpatient dialysis?: No       Financial Information       Need for financial assistance?: No       Social Drivers of Health  Social Drivers of Health     Food Insecurity: No Food Insecurity (07/07/2024)    Hunger Vital Sign     Worried About Running Out of Food in the Last Year: Never true     Ran Out of Food in the Last Year: Never true   Tobacco Use: Low Risk (07/07/2024)    Patient History     Smoking Tobacco Use: Never     Smokeless Tobacco Use: Never     Passive Exposure: Not on file   Transportation Needs: No Transportation Needs (07/07/2024)    PRAPARE - Transportation     Lack of Transportation (Medical): No     Lack of Transportation (Non-Medical): No   Alcohol Use: Not on file   Housing: Low Risk (07/07/2024)    Housing     Within the past 12 months, have you ever stayed: outside, in a car, in a tent, in an overnight shelter, or temporarily in someone else's home (i.e. couch-surfing)?: No     Are you worried about losing your housing?: No   Physical Activity: Not on file   Utilities: Low Risk (07/07/2024)    Utilities     Within the past 12 months, have you been unable to get utilities (heat, electricity) when it was really needed?: No   Stress: Not on file   Interpersonal Safety: Not At Risk (07/03/2024)    Interpersonal Safety     Unsafe Where You Currently Live: No     Physically Hurt by Anyone: No     Abused by Anyone: No   Substance Use: Not on file (06/11/2023)   Intimate Partner Violence: Not on file   Social Connections: Not on file   Financial Resource Strain: Low Risk (07/07/2024)    Overall Financial Resource Strain (CARDIA)     Difficulty of Paying Living Expenses: Not hard at all   Health Literacy: Not on file   Internet Connectivity: Not on file       Complex Discharge Information    Is patient identified as a difficult/complex discharge?:  No       Discharge Needs Assessment  Concerns to be Addressed: discharge planning, care coordination/care conferences    Clinical Risk Factors: > 65, Principal Diagnosis: Cancer, Stroke, COPD, Heart Failure, AMI, Pneumonia, Joint Replacment    Barriers to taking medications: No    Prior overnight hospital stay or ED visit in last 90 days: No       Anticipated Changes Related to Illness: other (see comments) (Likely will need time to recover to resume prior level of functioning.)    Equipment Needed After Discharge: other (see comments) (CM will follow for DME needs.)    Discharge Facility/Level of Care Needs: other (see comments) (CM will follow for DC needs.)    Readmission  Risk of Unplanned Readmission Score: UNPLANNED READMISSION SCORE: 16.43%  Predictive Model Details          16% (Medium)  Factor Value    Calculated 07/08/2024 08:04 20% Number of active inpatient medication orders 26    [facility] Risk of Unplanned Readmission Model 12% Diagnosis of cancer present     10% ECG/EKG order present in last 6 months     9% Latest BUN high (36 mg/dL)     7% Imaging order present in last 6 months     7% Latest hemoglobin low (12.0 g/dL)     6% Age 73     6% Number of ED visits in last six months 1     5% Active corticosteroid inpatient medication order present     5% Latest creatinine high (1.82 mg/dL)     4% Diagnosis of renal failure present     4% Current length of stay 3.934 days     4% Charlson Comorbidity Index 3     2% Future appointment scheduled      Readmitted Within the Last 30 Days? (No if blank)   Patient at risk for readmission?: Yes    Discharge Plan  Screen findings are: Care Manager reviewed the plan of the patient's care with the Multidisciplinary Team. No discharge planning needs identified at this time. Care Manager will continue to manage plan and monitor patient's progress with the team.    Expected Discharge Date: 07/14/2024    Expected Transfer from Critical Care:      Quality data for continuing care services shared with patient and/or representative?: N/A  Patient and/or family were provided with choice of facilities / services that are available and appropriate to meet post hospital care needs?: Other (Comment) (CM will provide choice of facilities/services if deemed medically necessary.)       Initial Assessment complete?: Yes

## 2024-07-08 NOTE — Plan of Care (Signed)
 Pt Discharge. Discharge paperwork reviewed with patient and spouse. Questions answered.  IV removed.

## 2024-07-08 NOTE — Discharge Summary (Signed)
 Physician Discharge Summary [facility]  1 [facility] OBSERVATION [facility]  [address]  [place] [address]  Dept: [phone]  Loc: [phone]     Identifying Information:   Nai Dasch  04/13/1951  [id number]    Primary Care Physician: Kandis Stefano [place], MD     Code Status: Full Code    Admit Date: 07/03/2024    Discharge Date: 07/08/2024     Discharge To: Home with Home Health and/or PT/OT    Discharge Service: [facility] - General Medicine Floor Team MED LITTIE GLENWOOD [place]     Discharge Attending Physician: Lamar Elsie Quale, MD    Discharge Diagnoses:   Principal Problem:    Frontal mass of brain (POA: Unknown)  Active Problems:    Hyperlipidemia (POA: Yes)    HTN (hypertension) (POA: Yes)    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    Items for outpatient follow up  [ ]  Steroid induced hyperglycemia- ensure normalization of blood sugars after dexamethasone  done, though will be continued on pred 5 mg daily per transplant nephrology. Consider addition of mealtime insulin   [ ]  Ensure continued stability or R sided weakness- on day of discharge pt still with L tongue deviation, 4/5 weakness in dorsiflexion on the R foot and weakness of R leg extension. Mild delayed speech noted.   [ ]  Ensure follow up scheduled with medical oncology, urology, radiation oncology, and palliative care    Left frontal lobe brain mass  Right sided weakness  Concern for new malignancy of unclear origin  Patient presented with 1 week of progressive right sided weakness, with no other neurologic issues or deficits, such as headache, dizziness, double vision, or confusion. On admission, patient with 3/5 strength in right lower extremity, left tongue deviation, and slight speech delay, otherwise neurologically intact.  Laboratory findings not concerning for infection (white blood cell count, patient hemodynamically stable). CT head with vasogenic edema in the left frontal lobe concerning for mass.  MRI brain with peripherally enhancing mass within left frontal lobe measuring 4.4 cm, concerning for malignancy. Patient was started on IV Decadron  on admission, and transitioned to PO decadron  taper in consultation with medical oncology. With steroids, patient had improvement in neurologic symptoms. Medical oncology was consulted and agreed with neurosurgical and urology workup as outlined. Radiation oncology was consulted and agreed with other workup, with no indication for radiation at this time. Neurosurgery was consulted, who agree lesion is metastatic versus new primary. Less likely autoimmune given extent of necrosis and less likely abscess given lack of infectious symptoms. Case was discussed with the neurosurgical oncology specialists. Craniotomy with resection was scheduled for Friday December 5th. Patient was discharged with the remainder of his PO dexamethasone  taper (set to end December 13th), and transplant nephrology advised starting prednisone  5 mg daily after the end of dexamethasone . Patient was referred to Undiagnosed Cancer clinic, urology, radiation oncology, and palliative care on discharge. Patient was discharged with home health services.  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  Patient with no new or worsening abdominal symptoms, no gross hematuria or other urinary symptoms, no history of tobacco use.  On admission, kidney function at baseline, no concern for urinary retention or obstruction.  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 Urology was consulted and agree with concern for malignancy, advised no acute intervention this hospitalization but referred for outpatient cystoscopy and tissue diagnosis including potential TURT. During hospitalization, patient with stable creatinine, adequate urine output, no hematuria or clots. Referred to urology on discharge.     History of renal cell carcinoma status post bilateral nephrectomy  Deceased donor kidney transplant  Hypertension  Bilateral nephrectomy in 07/26/2016 and July 26, 2017 secondary to RCC. Deceased donor kidney transplant in 07-27-19. On Envarsus  5 mg daily, mycophenalate 360 mg BID, amlodipine  10 mg daily and olmesartan  40 mg daily. Followed closely by Dr. [name] with [facility] transplant nephrology.  On admission, creatinine level at 1.9. which is within his usual baseline oliguric urine output, and no other urinary symptoms. Transplant nephrology was consulted, and advised to hold mycophenalate in setting of new malignancy, hold olmesartan  and can continue home amlodipine  if needed for BP control. Tac trough levels were monitored and tacrolimus  was continued as prescribed. NSGY queried potential of holding tacrolimus  post operatively for wound healing, but transplant nephrology advised to continue. Renal function remained within baseline throughout hospitalization. Patient discharged on Envarsus  5mg  daily. And advised to hold mycophenalate, amlodipine , and olmesartan  until follow up with Dr. [name] in late 07/26/2024.    Type 2 diabetes mellitus  Steroid induced hyperglycemia  Hemoglobin A1c 6.8. Held home metformin , restarted on discharge. Blood sugars fasting in 200's, likely steroid induced. Hyperglycemia managed with correctional insulin . Hyperlipidemia  Continued atorvastatin  40 mg daily    Outpatient Provider Follow Up Issues:   [ ]  Steroid induced hyperglycemia- ensure normalization of blood sugars after dexamethasone  done, though will be continued on pred 5 mg daily per transplant nephrology. Consider addition of mealtime insulin   [ ]  Ensure continued stability or R sided weakness- on day of discharge pt still with L tongue deviation, 4/5 weakness in dorsiflexion on the R foot and weakness of R leg extension. Mild delayed speech noted.   [ ]  Ensure follow up scheduled with medical oncology, urology, radiation oncology, and palliative care    Touchbase with Outpatient Provider:  Warm Handoff: Completed on 07/08/24 by Kip Brand, MD  (Intern) via [facility] Message - Dr. Preston, [facility]    Procedures:  None  ______________________________________________________________________  Discharge Medications:      Your Medication List        PAUSE taking these medications      amlodipine  10 MG tablet  Wait to take this until your doctor or other care provider tells you to start again.  Commonly known as: NORVASC   Take 1 tablet (10 mg total) by mouth daily.     mycophenolate  180 MG EC tablet  Wait to take this until your doctor or other care provider tells you to start again.  Commonly known as: MYFORTIC   Take 2 tablets (360 mg total) by mouth two (2) times a day.     olmesartan  40 MG tablet  Wait to take this until your doctor or other care provider tells you to  start again.  Commonly known as: BENICAR   Take 1 tablet (40 mg total) by mouth daily.            START taking these medications      dexAMETHasone  1 MG tablet  Commonly known as: DECADRON   Take 4 tablets (4 mg total) by mouth every twelve (12) hours for 3 days, THEN 4 tablets (4 mg total) daily for 3 days, THEN 2 tablets (2 mg total) daily for 3 days, THEN 1 tablet (1 mg total) daily for 3 days.  Start taking on: July 08, 2024     predniSONE  5 MG tablet  Commonly known as: DELTASONE   Take 1 tablet (5 mg total) by mouth daily.  Start taking on: July 20, 2024            CONTINUE taking these medications      acetaminophen  500 MG tablet  Commonly known as: TYLENOL   Take 1-2 tablets (500-1,000 mg total) by mouth every six (6) hours as needed for pain or fever (> 38C).     allopurinol  100 MG tablet  Commonly known as: ZYLOPRIM   Take 1 tablet (100 mg total) by mouth every evening.     atorvastatin  40 MG tablet  Commonly known as: LIPITOR   Take 1 tablet (40 mg total) by mouth daily.     ENVARSUS  XR 1 mg Tb24 extended release tablet  Take 1 tablet (1 mg total) by mouth in the morning. Take one tablet daily with ONE 4 mg tablet for a total daily dose of 5mg .     ENVARSUS  XR 4 mg Tb24 extended release tablet  Generic drug: tacrolimus   Take 1 tablet (4 mg total) by mouth daily. Take in addition to ONE 1 mg tablets for a total daily dose of 5 mg.     latanoprost 0.005 % ophthalmic solution  Commonly known as: XALATAN  Administer 1 drop to both eyes nightly.     magnesium (amino acid chelate) 133 mg tablet  Generic drug: magnesium oxide-Mg AA chelate  Take 1 tablet by mouth two (2) times a day.     metFORMIN  500 MG 24 hr tablet  Commonly known as: GLUCOPHAGE -XR  Take 1 tablet (500 mg total) by mouth in the morning.              Allergies:  Patient has no known allergies.  ______________________________________________________________________  Pending Test Results:      Most Recent Labs:  All lab results last 24 hours -   Recent Results (from the past 24 hours)   POCT Glucose    Collection Time: 07/07/24  9:16 PM   Result Value Ref Range    Glucose, POC 243 (H) 70 - 179 mg/dL   Basic metabolic panel    Collection Time: 07/08/24  3:51 AM   Result Value Ref Range    Sodium 140 135 - 145 mmol/L    Potassium 4.9 (H) 3.4 - 4.8 mmol/L    Chloride 110 (H) 98 - 107 mmol/L    CO2 21.0 20.0 - 31.0 mmol/L    Anion Gap 9 5 - 14 mmol/L    BUN 36 (H) 9 - 23 mg/dL    Creatinine 8.17 (H) 0.73 - 1.18 mg/dL    BUN/Creatinine Ratio 20     eGFR CKD-EPI (2021) Male 39 (L) >=60 mL/min/1.46m2    Glucose 198 (H) 70 - 179 mg/dL    Calcium 9.6 8.7 - 89.5 mg/dL   CBC    Collection Time:  07/08/24  3:51 AM   Result Value Ref Range    WBC 11.7 (H) 3.6 - 11.2 10*9/L    RBC 4.14 (L) 4.26 - 5.60 10*12/L    HGB 12.0 (L) 12.9 - 16.5 g/dL    HCT 63.2 (L) 60.9 - 48.0 %    MCV 88.5 77.6 - 95.7 fL    MCH 29.0 25.9 - 32.4 pg    MCHC 32.8 32.0 - 36.0 g/dL    RDW 84.6 (H) 87.7 - 15.2 %    MPV 9.2 6.8 - 10.7 fL    Platelet 199 150 - 450 10*9/L   POCT Glucose    Collection Time: 07/08/24  7:05 AM   Result Value Ref Range    Glucose, POC 154 70 - 179 mg/dL   POCT Glucose    Collection Time: 07/08/24 12:15 PM   Result Value Ref Range    Glucose, POC 212 (H) 70 - 179 mg/dL   POCT Glucose    Collection Time: 07/08/24  4:20 PM   Result Value Ref Range    Glucose, POC 284 (H) 70 - 179 mg/dL       Relevant Studies/Radiology:  ECG 12 Lead  Result Date: 07/04/2024  SINUS RHYTHM WITH 1ST DEGREE AV BLOCK SEPTAL INFARCT  , AGE UNDETERMINED ABNORMAL ECG WHEN COMPARED WITH ECG OF 22-Jun-2019 17:42, PR INTERVAL HAS INCREASED SEPTAL INFARCT  IS NOW PRESENT NON-SPECIFIC CHANGE IN ST SEGMENT IN INFERIOR LEADS QT HAS SHORTENED Confirmed by Von Shawl [phone]) on 07/04/2024 8:50:00 AM    MRI Brain W Wo Contrast  Result Date: 07/04/2024  EXAM: Magnetic resonance imaging, brain, without and with contrast material. ACCESSION: [id number] UN CLINICAL INDICATION: 73 years old Male with mass left frontal region further eval  COMPARISON: CT head 07/03/2024. TECHNIQUE: Multiplanar, multisequence MR imaging of the brain was performed without and with I.V. contrast. FINDINGS:  There are scattered foci of signal abnormality within the periventricular and deep white matter.  These are nonspecific but commonly seen with small vessel ischemic changes. Small focus of SWI dropout in the left cerebellum, likely chronic microhemorrhage. Generalized cerebral volume loss with ex vacuo dilatation of the ventricles. There is no midline shift. No extra-axial fluid collection. No evidence of intracranial hemorrhage. No diffusion weighted signal abnormality to suggest acute infarct. Heterogeneously enhancing mass within the left frontal lobe involving subcortical white matter and cortex, extending from the vertex to the superior wall of the left lateral ventricle measuring approximately 3.0 x 1.7 x 4.4 cm (14:37, 1033:111) with associated vasogenic edema and restricted diffusion. The lesion demonstrates avid peripheral enhancement with milder degrees of central enhancement. Small right mastoid effusion.     Peripherally enhancing mass within the left frontal lobe measuring up to 4.4 cm, concerning for malignancy. ++++++++++++++++++++ The findings of this study were discussed via telephone with DR. Tavien Mapp by Dr. Renda Code on 07/04/2024 7:22 AM. -----------------------------------------------    CT Abdomen Pelvis W IV Contrast Only  Result Date: 07/04/2024  EXAM: CT ABDOMEN PELVIS W CONTRAST ACCESSION: [id number] UN REPORT DATE: 07/04/2024 2:40 AM CLINICAL INDICATION: 73 years old with brain tumor eval for sites of malignancy  COMPARISON: CT abdomen/pelvis from 06/28/2022. TECHNIQUE: A helical CT scan of the abdomen and pelvis was obtained following IV contrast from the lung bases through the pubic symphysis. Images were reconstructed in the axial plane. Coronal and sagittal reformatted images were also provided for further evaluation. FINDINGS: LOWER CHEST: Please see the contemporaneous CT chest with contrast for characterization of findings  above the diaphragm. LIVER: Normal liver contour.  No focal liver lesions. BILIARY: The gallbladder is normal in appearance. No biliary ductal dilatation.  SPLEEN: Splenectomy. Similar splenosis. PANCREAS: Normal pancreatic contour.  No focal lesions.  No ductal dilation. ADRENAL GLANDS: Normal appearance of the adrenal glands. KIDNEYS/URETERS: Bilateral nephrectomies with similar size and morphology of the nodular soft tissue within the right nephrectomy bed, though with decreased internal macroscopic fat. Transplant kidney within the left lower quadrant with no hydronephrosis or solid renal mass. BLADDER: Hyperattenuating bilobed lesion or adjacent lesions along the right anterior bladder wall with the larger upper lobe/mass measuring approximately 2.0 x 1.4 cm (2:133). REPRODUCTIVE ORGANS: Unremarkable. GI TRACT: No findings of bowel obstruction or acute inflammation. Colonic diverticulosis without evidence of diverticulitis. Normal appendix. PERITONEUM, RETROPERITONEUM AND MESENTERY: No free air.  No ascites.  No fluid collection. LYMPH NODES: No adenopathy. VESSELS: Hepatic and portal veins are patent. The aorta is normal in caliber with mild calcifications throughout the aorta. BONES and SOFT TISSUES: Atrophy of the bilateral external oblique muscles. Unchanged sclerotic lesion within the L3 vertebra. No focal soft tissue lesions.     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 Recommend Urologic consultation for cystoscopy.     CT Chest W Contrast  Result Date: 07/04/2024  EXAM: CT CHEST W CONTRAST ACCESSION: [id number] UN REPORT DATE: 07/04/2024 2:32 AM CLINICAL INDICATION: brain tumor eval for sites of malignancy TECHNIQUE: Contiguous axial images were reconstructed through the chest following a single breath hold helical acquisition during the administration of intravenous contrast material. Images were reformatted in the axial, coronal, and sagittal planes. MIP slabs were also constructed. COMPARISON: CT chest from 06/28/2024. FINDINGS: There is minimal contrast within the vasculature. LUNGS/AIRWAYS/PLEURA: Trachea and large airways are patent. Mild bibasilar atelectasis and/or scarring. No pleural effusion or pneumothorax. MEDIASTINUM/THORACIC INLET: No enlarged supraclavicular or intrathoracic lymph nodes. No thyroid abnormality. HEART/VASCULATURE: Cardiac chambers are normal in size. Moderate coronary artery calcifications. No pericardial effusion. Aorta is normal in caliber. Main pulmonary artery is mildly enlarged, measuring 3.2 cm. DIAPHRAGM/UPPER ABDOMEN: Diaphragm intact and normally positioned. Abdomen findings reported separately on the contemporaneous CT abdomen/pelvis. CHEST WALL/BONES: Bilateral gynecomastia. No enlarged axillary lymph nodes. Mild degenerative changes of the thoracic spine. Intraosseous cyst/ganglion along the rotator cuff footprint in the proximal right humerus. DEVICES: None.     1.  No acute abnormality of the chest. 2.  No evidence of metastatic disease within the chest. 3.  Mildly enlarged pulmonary artery can be seen with pulmonary hypertension.     CT head WO contrast  Result Date: 07/03/2024  EXAM: Computed tomography, head or brain without contrast material. ACCESSION: [id number] UN CLINICAL INDICATION: 73 years old Male with Concern for RUE, RLE weakness 2 weeks  COMPARISON: CT head from 11/27/2022 TECHNIQUE: Axial CT images of the head from skull base to vertex without contrast. FINDINGS: Vasogenic edema within the left frontal lobe with mass effect on the frontal horn of the left lateral ventricle. No midline shift. Basal cisterns are preserved. No evidence of acute infarct.  The sinuses are pneumatized. Calcification in the left genu of the corpus callosum. No intracranial hemorrhage or skull fractures. Calcifications of the bilateral cavernous carotids.     Vasogenic edema within the left frontal lobe concerning for a mass lesion. Recommend MRI brain with contrast for better characterization. ++++++++++++++++++++ The findings of this study were discussed via epic chat with DR. ANDRES ANIBAL LOPEZ by Dr. Prentice Kansky  on 07/03/2024 8:22 PM. ----------------------------------------------- ______________________________________________________________________  Discharge Instructions:   Activity Instructions       Activity as tolerated                       Follow Up instructions and Outpatient Referrals     Ambulatory Referral to Hematology / Oncology      Reason for referral: new brain mass + bladder mass c/f new malignancy,   will get craniotomy on 12/5- inpatient team saw him    Ambulatory Referral to Home Health      Reason for referral: Stregthening and conditioning    Physician to follow patient's care: PCP    Disciplines requested:  Physical Therapy  Occupational Therapy       Physical Therapy requested:  Home safety evaluation  Evaluate and treat       Occupational Therapy Requested:  Evaluate and treat  Home safety evaluation       Ambulatory Referral to Palliative Care      Reason for referral: hx renal cell carcinoma s/p bilateral nephrectomy,   now discharging after hospitalization for new brain + bladder masses c/f   new primary CNS malignancy. residual weakness. will have craniotomy on   12/5.    Is this a palliative referral for:  coping support  other (please list in comments)       [place] [facility] [place] (Non-Cancer Diagnosis)    [place] [facility] [place] (Cancer Diagnosis)    [place] [facility] [place] (Cancer Diagnosis - [place])      [place] [facility] (Home-Based)    [facility]DR[facility] [place] (Pediatrics)    *For [facility] Palliative Care Programs, contact the organization directly   for external referral process    Ambulatory Referral to Radiation Oncology      Reason for referral: new brain mass, inpatient team saw, eval need for RT      Ambulatory Referral to Urology      Reason for referral: hx RCC s/p bilateral nephrectomy, new bladder mass   c/f urothelial malignancy, inpatient team saw him    Specific Services Requested: Urologic Oncology    What urologic oncology condition is this for?: Bladder Ca/Mass    Call MD for:  persistent nausea or vomiting      Call MD for:  severe uncontrolled pain      Call MD for: Temperature > 38.5 Celsius ( > 101.3 Fahrenheit)      Discharge instructions          Appointments which have been scheduled for you      Jul 11, 2024  CRANIECTOMY; EXC BRAIN TUMOR-SUPRATENTORIAL, STEREOTACTIC COMPUTER-ASSISTED (NAVIGATIONAL) PROCEDURE; CRANIAL, INTRADURAL, CONTINUOUS INTRAOPERATIVE NEUROPHYSIOLOGY MONITORING IN OR, MICROSURGICAL TECHNIQUES, REQUIRING USE OF OPERATING MICROSCOPE (LIST SEPARATELY IN ADDITION TO CODE FOR PRIMARY PROCEDURE) with Rosaria CHRISTELLA Lush, MD  [facility]) [address]  [place] [address]  [phone]     Aug 06, 2024 7:30 AM  (Arrive by 7:15 AM)  LAB ONLY with [place] 1ST FLOOR PHLEB  LAB [place] ([place]) [address]  [facility] 1 through 4  [place] [address]        Aug 06, 2024 8:00 AM  (Arrive by 7:45 AM)  RETURN NEPHROLOGY POST with [name], DO  [facility] TRANSPLANT [place] ([place]) [address]  [place] 1 through [address]  [place] [address]  [phone]        Sep 09, 2024 9:00 AM  NEW CATARACT with Emery Peri, MD  [facility] OPHTHALMOLOGY [address] [place] REG[facility]) [address]  [place] [address]  [phone]   Be advised the duration of your appointment with may range 1-2.5 hours.            ______________________________________________________________________  Discharge Day Services:  BP 137/65  - Pulse 54  - Temp 36.4 ??C (97.5 ??F) (Oral)  - Resp 18  - Ht 170.2 cm (5' 7)  - Wt 72.6 kg (160 lb)  - SpO2 100%  - BMI 25.06 kg/m??     Pt seen on the day of discharge and determined appropriate for discharge.    Condition at Discharge: good    Length of Discharge: I spent greater than 30 mins in the discharge of this patient.

## 2024-07-09 MED FILL — PREDNISONE 5 MG TABLET: ORAL | 90 days supply | Qty: 90 | Fill #0

## 2024-07-09 NOTE — Telephone Encounter (Signed)
 LVM for patient to call back and schedule the following:    Requesting physician/contact for questions: Comer Sou, MD       Date(s) Needed: In 1 month     Appt to be scheduled under: Onc APPs or any available provider     Type of Appt: New patient - Incidentally found right anterior bladder wall mass on CT imaging concerning for malignancy during hospitalization for RLE weakness      Need to contact patient: Yes please     Special Requests/Instructions: None     Orders placed for Special Requests: N/A

## 2024-07-10 NOTE — Telephone Encounter (Signed)
 Per Pre-arrival insurance still pending for 12/5, wife aware. Wants to proceed with pending auth, accepts financial responsibility if shara is still pending DOS. Fwd to provider for his thoughts

## 2024-07-11 ENCOUNTER — Ambulatory Visit: Admit: 2024-07-11 | Payer: Medicare (Managed Care)

## 2024-07-11 ENCOUNTER — Encounter: Admit: 2024-07-11 | Payer: Medicare (Managed Care)

## 2024-07-11 ENCOUNTER — Encounter: Admit: 2024-07-11 | Payer: Medicare (Managed Care) | Attending: Critical Care Medicine

## 2024-07-11 ENCOUNTER — Inpatient Hospital Stay
Admission: RE | Admit: 2024-07-11 | Discharge: 2024-07-22 | Disposition: A | Discharge status: Rehabilitation Facility | Payer: Medicare (Managed Care)

## 2024-07-11 LAB — BLOOD GAS CRITICAL CARE PANEL, ARTERIAL
BASE EXCESS ARTERIAL: -7 — ABNORMAL LOW (ref -2.0–2.0)
BASE EXCESS ARTERIAL: -7 — ABNORMAL LOW (ref -2.0–2.0)
BASE EXCESS ARTERIAL: -7.3 — ABNORMAL LOW (ref -2.0–2.0)
CALCIUM IONIZED ARTERIAL (MG/DL): 5.05 mg/dL (ref 4.40–5.40)
CALCIUM IONIZED ARTERIAL (MG/DL): 5.2 mg/dL (ref 4.40–5.40)
CALCIUM IONIZED ARTERIAL (MG/DL): 5.13 mg/dL (ref 4.40–5.40)
CARBOXYHEMOGLOBIN: 1 % (ref <1.2)
CARBOXYHEMOGLOBIN: 1.2 % — ABNORMAL HIGH (ref <1.2)
CARBOXYHEMOGLOBIN: 1.2 % — ABNORMAL HIGH (ref <1.2)
CHLORIDE, WHOLE BLOOD: 114 mmol/L — ABNORMAL HIGH (ref 98–107)
CHLORIDE, WHOLE BLOOD: 108 mmol/L — ABNORMAL HIGH (ref 98–107)
CHLORIDE, WHOLE BLOOD: 110 mmol/L — ABNORMAL HIGH (ref 98–107)
GLUCOSE WHOLE BLOOD: 229 mg/dL — ABNORMAL HIGH (ref 70–179)
GLUCOSE WHOLE BLOOD: 221 mg/dL — ABNORMAL HIGH (ref 70–179)
GLUCOSE WHOLE BLOOD: 171 mg/dL (ref 70–179)
HCO3 ARTERIAL: 18 mmol/L — ABNORMAL LOW (ref 22–27)
HCO3 ARTERIAL: 18 mmol/L — ABNORMAL LOW (ref 22–27)
HCO3 ARTERIAL: 17 mmol/L — ABNORMAL LOW (ref 22–27)
HEMOGLOBIN BLOOD GAS: 10.8 g/dL — ABNORMAL LOW (ref 13.50–17.50)
HEMOGLOBIN BLOOD GAS: 10.8 g/dL — ABNORMAL LOW (ref 13.50–17.50)
HEMOGLOBIN BLOOD GAS: 11.2 g/dL — ABNORMAL LOW (ref 13.50–17.50)
LACTATE BLOOD ARTERIAL: 1.3 mmol/L — ABNORMAL HIGH (ref <1.3)
LACTATE BLOOD ARTERIAL: 1.1 mmol/L (ref <1.3)
LACTATE BLOOD ARTERIAL: 1.4 mmol/L — ABNORMAL HIGH (ref <1.3)
METHEMOGLOBIN: <1 % (ref <1.5)
METHEMOGLOBIN: <1 % (ref <1.5)
METHEMOGLOBIN: 1 % (ref <1.5)
O2 SATURATION ARTERIAL: 99.3 % (ref 94.0–100.0)
O2 SATURATION ARTERIAL: 99.1 % (ref 94.0–100.0)
O2 SATURATION ARTERIAL: 99.4 % (ref 94.0–100.0)
OXYHEMOGLOBIN: 97.5 % (ref 94.0–100.0)
OXYHEMOGLOBIN: 97 % (ref 94.0–100.0)
OXYHEMOGLOBIN: 97.2 % (ref 94.0–100.0)
PCO2 ARTERIAL: 40.1 mmHg (ref 35.0–45.0)
PCO2 ARTERIAL: 34.1 mmHg — ABNORMAL LOW (ref 35.0–45.0)
PCO2 ARTERIAL: 33.4 mmHg — ABNORMAL LOW (ref 35.0–45.0)
PH ARTERIAL: 7.28 — ABNORMAL LOW (ref 7.35–7.45)
PH ARTERIAL: 7.34 — ABNORMAL LOW (ref 7.35–7.45)
PH ARTERIAL: 7.34 — ABNORMAL LOW (ref 7.35–7.45)
PO2 ARTERIAL: 169 mmHg — ABNORMAL HIGH (ref 80.0–110.0)
PO2 ARTERIAL: 136 mmHg — ABNORMAL HIGH (ref 80.0–110.0)
PO2 ARTERIAL: 157 mmHg — ABNORMAL HIGH (ref 80.0–110.0)
POTASSIUM WHOLE BLOOD: 4.8 mmol/L — ABNORMAL HIGH (ref 3.4–4.6)
POTASSIUM WHOLE BLOOD: 4.9 mmol/L — ABNORMAL HIGH (ref 3.4–4.6)
POTASSIUM WHOLE BLOOD: 4.9 mmol/L — ABNORMAL HIGH (ref 3.4–4.6)
SODIUM WHOLE BLOOD: 130 mmol/L — ABNORMAL LOW (ref 135–145)
SODIUM WHOLE BLOOD: 132 mmol/L — ABNORMAL LOW (ref 135–145)
SODIUM WHOLE BLOOD: 133 mmol/L — ABNORMAL LOW (ref 135–145)

## 2024-07-11 LAB — QUANTRA QSTAT
CLOT STABILITY TO LYSIS (CSL): 99 % (ref 92–100)
CLOT STIFFNESS (CS): 13.6 hPa — ABNORMAL LOW (ref 14.0–35.4)
CLOT TIME (CT): 111 s — ABNORMAL LOW (ref 121–175)
FIBRINOGEN CONTRIBUTION TO CLOT STIFFNESS (FCS): 1.4 hPa (ref 0.9–4.2)
PLATELET CONTRIBUTION TO CLOT STIFFNESS (PCS): 12.2 hPa — ABNORMAL LOW (ref 12.8–32.3)

## 2024-07-11 MED ADMIN — aminolevulinic acid HCL (GLEOLAN) oral solution 1,452 mg: 20 mg/kg | ORAL | @ 15:00:00 | Stop: 2024-07-11

## 2024-07-11 MED ADMIN — thrombin (recombinant) (RECOTHROM) 5,000 unit topical: TOPICAL | @ 18:00:00 | Stop: 2024-07-11

## 2024-07-11 MED ADMIN — thrombin (recombinant) (RECOTHROM) 5,000 unit topical: TOPICAL | @ 20:00:00 | Stop: 2024-07-11

## 2024-07-11 MED ADMIN — Propofol (DIPRIVAN) injection: INTRAVENOUS | @ 18:00:00 | Stop: 2024-07-11

## 2024-07-11 MED ADMIN — Propofol (DIPRIVAN) injection: INTRAVENOUS | @ 17:00:00 | Stop: 2024-07-11

## 2024-07-11 MED ADMIN — Propofol (DIPRIVAN) injection: INTRAVENOUS | @ 16:00:00 | Stop: 2024-07-11

## 2024-07-11 MED ADMIN — ePHEDrine (PF) 25 mg/5 mL (5 mg/mL) in 0.9% sodium chloride syringe: INTRAVENOUS | @ 21:00:00 | Stop: 2024-07-11

## 2024-07-11 MED ADMIN — ePHEDrine (PF) 25 mg/5 mL (5 mg/mL) in 0.9% sodium chloride syringe: INTRAVENOUS | @ 18:00:00 | Stop: 2024-07-11

## 2024-07-11 MED ADMIN — ePHEDrine (PF) 25 mg/5 mL (5 mg/mL) in 0.9% sodium chloride syringe: INTRAVENOUS | @ 17:00:00 | Stop: 2024-07-11

## 2024-07-11 MED ADMIN — ePHEDrine (PF) 25 mg/5 mL (5 mg/mL) in 0.9% sodium chloride syringe: INTRAVENOUS | @ 19:00:00 | Stop: 2024-07-11

## 2024-07-11 MED ADMIN — phenylephrine 1 mg/10 mL (100 mcg/mL) injection Syrg: INTRAVENOUS | @ 21:00:00 | Stop: 2024-07-11

## 2024-07-11 MED ADMIN — senna (SENOKOT) tablet 2 tablet: 2 | ORAL | @ 23:00:00

## 2024-07-11 MED ADMIN — acetaminophen (TYLENOL) tablet 1,000 mg: 1000 mg | ORAL | @ 15:00:00 | Stop: 2024-07-11

## 2024-07-11 MED ADMIN — lidocaine (PF) (XYLOCAINE-MPF) 20 mg/mL (2 %) injection: INTRAVENOUS | @ 17:00:00 | Stop: 2024-07-11

## 2024-07-11 MED ADMIN — lidocaine (PF) (XYLOCAINE-MPF) 20 mg/mL (2 %) injection: INTRAVENOUS | @ 16:00:00 | Stop: 2024-07-11

## 2024-07-11 MED ADMIN — electrolyte-A (PLASMA-LYT A) infusion: INTRAVENOUS | @ 16:00:00 | Stop: 2024-07-11

## 2024-07-11 MED ADMIN — thrombin-fibrinogen-aprotin-Ca (TISSEEL VHSD) 10 mL topical syringe: TOPICAL | @ 21:00:00 | Stop: 2024-07-11

## 2024-07-11 MED ADMIN — valsartan (DIOVAN) tablet 160 mg: 160 mg | ORAL | @ 23:00:00

## 2024-07-11 MED ADMIN — ondansetron (ZOFRAN) injection: INTRAVENOUS | @ 21:00:00 | Stop: 2024-07-11

## 2024-07-11 MED ADMIN — sodium chloride irrigation (NS) 0.9 % irrigation solution: @ 17:00:00 | Stop: 2024-07-11

## 2024-07-11 MED ADMIN — dexmedeTOMIDine (Precedex) 400 mcg in sodium chloride 0.9% 100 ml (4 mcg/mL) infusion PMB: INTRAVENOUS | @ 21:00:00 | Stop: 2024-07-11

## 2024-07-11 MED ADMIN — matrix hemostatic sealant (FLOSEAL) topical: TOPICAL | @ 18:00:00 | Stop: 2024-07-11

## 2024-07-11 MED ADMIN — niCARdipine 40 mg in sodium chloride 0.9% 200 mL (0.2 mg/mL) infusion PMB: 0-15 mg/h | INTRAVENOUS | @ 22:00:00

## 2024-07-11 MED ADMIN — bacitracin ointment: TOPICAL | @ 16:00:00 | Stop: 2024-07-11

## 2024-07-11 MED ADMIN — glycopyrrolate (ROBINUL) injection: INTRAVENOUS | @ 16:00:00 | Stop: 2024-07-11

## 2024-07-11 MED ADMIN — glycopyrrolate (ROBINUL) injection: INTRAVENOUS | @ 17:00:00 | Stop: 2024-07-11

## 2024-07-11 MED ADMIN — ceFAZolin in 50ml iso-osmotic dextrose (ANCEF) 2 gram/50 mL IVPB: INTRAVENOUS | @ 17:00:00 | Stop: 2024-07-11

## 2024-07-11 MED ADMIN — ceFAZolin in 50ml iso-osmotic dextrose (ANCEF) 2 gram/50 mL IVPB: INTRAVENOUS | @ 21:00:00 | Stop: 2024-07-11

## 2024-07-11 MED ADMIN — insulin regular (HumuLIN,NovoLIN) injection: INTRAVENOUS | @ 18:00:00 | Stop: 2024-07-11

## 2024-07-11 MED ADMIN — sodium chloride (NS) 0.9 % infusion: INTRAVENOUS | @ 16:00:00 | Stop: 2024-07-11

## 2024-07-11 MED ADMIN — remifentanil (ULTIVA) 1,000 mcg in sodium chloride (NS) 20 mL: INTRAVENOUS | @ 16:00:00 | Stop: 2024-07-11

## 2024-07-11 MED ADMIN — bupivacaine-EPINEPHrine (PF) (MARCAINE-PF w/EPI) 0.25 %-1:200,000 injection (PF): @ 17:00:00 | Stop: 2024-07-11

## 2024-07-11 MED ADMIN — niCARdipine 40 mg in sodium chloride 0.9% 200 mL (0.2 mg/mL) infusion PMB: INTRAVENOUS | @ 22:00:00 | Stop: 2024-07-11

## 2024-07-11 MED ADMIN — amlodipine (NORVASC) tablet 10 mg: 10 mg | ORAL | @ 23:00:00

## 2024-07-11 MED ADMIN — fentaNYL (PF) (SUBLIMAZE) injection: INTRAVENOUS | @ 16:00:00 | Stop: 2024-07-11

## 2024-07-11 MED ADMIN — vancomycin (VANCOCIN) topical powder: TOPICAL | @ 21:00:00 | Stop: 2024-07-11

## 2024-07-11 MED ADMIN — succinylcholine chloride (ANECTINE) injection: INTRAVENOUS | @ 16:00:00 | Stop: 2024-07-11

## 2024-07-11 MED ADMIN — dexAMETHasone (DECADRON) 4 mg/mL injection: INTRAVENOUS | @ 17:00:00 | Stop: 2024-07-11

## 2024-07-11 MED ADMIN — insulin regular (HumuLIN,NovoLIN) 100 Units in sodium chloride (NS) 0.9 % 100 mL infusion: INTRAVENOUS | @ 18:00:00 | Stop: 2024-07-11

## 2024-07-11 MED ADMIN — mannitol 20 % infusion: INTRAVENOUS | @ 17:00:00 | Stop: 2024-07-11

## 2024-07-11 MED ADMIN — levETIRAcetam (KEPPRA) injection: INTRAVENOUS | @ 17:00:00 | Stop: 2024-07-11

## 2024-07-11 MED ADMIN — NORepinephrine 8 mg in dextrose 5 % 250 mL (32 mcg/mL) infusion PMB: INTRAVENOUS | @ 16:00:00 | Stop: 2024-07-11

## 2024-07-11 NOTE — Progress Notes (Signed)
 [facility]   INPATIENT PROGRESS NOTE      Brief History of Present Illness  Frank Leach is a 73 y.o. male with RCC/renal transplant now found to have a left frontal brain mass now s/p left craniotomy for resection (12/5).    Subjective/Interval History  Post-op check.     Interval Imaging Reviewed  none    Neurological Assessment and Plan  **Frontal mass of brain  POD 0 Left craniotomy for resection of brain mass (12/5)  - SBP < 140; Na > 135  - dex6q6  - Keppra  500 BID x 7 days  - ok to continue tacrolimus   - STAT MRI brain w/wo  - HOB > 30    Anticoagulant/Antiplatelet needs: ASA    Disposition: NSICU    This patient is co-managed with the [facility]. The management of this patient was discussed in detail with them and we agree with their overall assessment and plan.      ___________________________________________________________________    Neurological Exam  EOSp  AOx3  PERRL  EOMI  R FD  TML  RUE 4-  LUE 5  RLE 4-  LLE 5    Dressing c/d/i    The patient's vitals, intake/output, labs, orders, and relevant imaging were reviewed for the last 24 hours.    Problem List  Principal Problem:    Frontal mass of brain

## 2024-07-11 NOTE — Progress Notes (Signed)
 Neurocritical Care Admission Note     CODE STATUS:    Code Status: Full Code    HCDM (patient stated preference): Lentsch,Shirley - Spouse - [phone]  I discussed and confirmed Code Status with patient or HCDM    Date of service: 07/11/2024  Hospital Day:  LOS: 0 days        HPI     Frank Leach is a 73 y.o. 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 He is now POD 0 L crani for mass resection       History     PAST MEDICAL HISTORY  Past Medical History[1]    SURGICAL HISTORY  Past Surgical History[2]    HOME MEDICATIONS  Meds ordered prior to current encounter[3]    Allergies  Patient has no known allergies.    Family History  Family History[4]    Social History  Short Social History[5]      Review of Systems     Pertinent items are noted in HPI.       Assessment and Plan     Frank Leach is a 73 y.o. male admitted to NSICU with a diagnosis of brain mass.       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  - obtain PT/OT consults  - patient remains at risk for neurological decline due to post-op hemorrhage, cerebral edema, and seizures and requires close monitoring in the NSICU    **Pain  - tylenol  prn mild pain  - oxycodone  prn moderate pain  - fentanyl  prn severe pain    **Family communication   Family updated by [facility] post-op    Cardiovascular:  Admission weight: 72.6  Daily Weight: 70.5 kg (155 lb 6.8 oz)     Hemodynamic goals:  - MAP > 65  - SBP < 140    **Hypertensive emergency  - start nicardipine  infusion to meet above goals  - resume home meds- valsartan  160, amlo 10  - check cardiac enzymes and ECG  - patient has high risk of end organ damage due to hypertension    **Hyperlipidemia  - continue atorva 40mg      Pulmonary    - Stable on RA  - Titrate O2 for sat > 92%      FEN  Body mass index is 24.34 kg/m??.    GI prophylaxis: H2 blocker (steroids)     Foley catheter: Foley required for accurate continuous I/O's.      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   - urology was consulted last admission and recommended outpatient cystoscopy and tissue diagnosis including potential TURT    **H/O renal cell carcinoma s/p bilateral nephrectomy and kidney transplant  - continue Tacrolimus  (envarsus ) 5mg  daily  - tx renal consulted last admission and recommended holding mycophenalate in setting of new malignancy      **Nutrition  - advance diet as  tolerated    **Nausea  - zofran  prn    **Type 2 diabetes mellitus/steroid induced hyperglycemia  - A1c 6.8  - start FSBS and SSI to prevent hyperglycemia and to assess insulin  needs      **Fluids/electrolytes  - foley now for strict ins/outs, d/c foley on POD#1  - obtain post-op chemistry and supplement electrolytes as needed    Heme/ID  Current DVT prophylaxis: SCDs, No chemical DVT prophylaxis due to bleeding risk    - d/c arterial line on POD #1      Current Access:         -  PIV x 2       - Arterial line: Arterial line required for BP monitoring.       - Central venous line: No central line present.    Tubes and drains:  Patient Lines/Drains/Airways Status       Active Active Lines, Drains, & Airways       Name Placement date Placement time Site Days    Urethral Catheter Non-latex;Temperature probe 16 Fr. 07/11/24  1136  Non-latex;Temperature probe  less than 1    Peripheral IV 07/11/24 Anterior;Right Forearm 07/11/24  1035  Forearm  less than 1    Peripheral IV 07/11/24 Left Foot 07/11/24  1120  Foot  less than 1    Peripheral IV 07/11/24 Left Hand 07/11/24  1120  Hand  less than 1    Arterial Line 07/11/24 Left Radial 07/11/24  1504  Radial  less than 1                           Objective Data     All vital signs and resulted laboratory studies for the past 24 hours have been reviewed.  All ordered medications have been reviewed.    Temp:  [36.6 ??C (97.9 ??F)-36.7 ??C (98.1 ??F)] 36.6 ??C (97.9 ??F)  Pulse:  [49-97] 97  SpO2 Pulse:  [89-95] 95  Resp:  [16-27] 27  BP: (191)/(83) 191/83  MAP (mmHg):  [112] 112  A BP-2: (131-132)/(52-57) 131/57  MAP:  [77 mmHg-83 mmHg] 83 mmHg  SpO2:  [99 %-100 %] 100 %    Intake/Output Summary (Last 24 hours) at 07/11/2024 1723  Last data filed at 07/11/2024 1610  Gross per 24 hour   Intake 1250 ml   Output 575 ml   Net 675 ml        Physical Exam          General Exam:  General: Awake and alert.   Appears comfortable. No obvious distress.   ENT:  Mucous membranes moist. Oropharynx clear.  Cardiovascular:  Regular rate and rhythm.  2+ radial pulses bilaterally.    Respiratory: Breathing is comfortable and unlabored.   Gastrointestinal: Soft, nontender, nondistended.  Extremities: Warm and well-perfused. No cyanosis, clubbing, or edema.  Skin: No obvious rashes or ecchymoses.    Neurological Exam:  Mental Status  LOC: awake and alert  Orientation: person, place and time  Orientation: stuttering, pt stated baseline  Neglect: right sided neglect  Speech: mild dysarthria    Cranial Nerves  Left pupil > Right pupil  Gaze: normal  Face Motor: normal and symmetric    Motor:   RUE: commands and 2/5  LUE: commands and 4+/5  RLE: commands and 2/5  LLE: commands and 4+/5     Sensory:    Sensory is intact to light touch    Glasgow Coma Score  Motor: obeys commands = 6  Verbal: oriented = 5  Eyes: eyes open spontaneously = 4        DISPOSITION     The patient requires admission to the [facility] for management of the above conditions.    Estimated Transfer Date:  TBD        BILLING     This patient is critically ill or injured with the impairment of vital organ systems such that there is a high probability of imminent or life threatening deterioration in the patient's condition. The reason the patient is critically ill and the nature of the treatment and management provided to manage the critically ill patient is as listed in above note.   I directly provided 32 minutes of critical care time as documented in this note. Time includes: direct patient care, patient reassessment, coordination of patient care, interpretation of data, review of patient medical records, and documentation of patient care. This time is exclusive of separately billable procedures.      Chiquita JONETTA Louder, ACNP  Neurocritical Care                 [1]   Past Medical History:  Diagnosis Date    Anemia     Cancer    (CMS-HCC)     clear cell adenocarcinoma of left kidney    Diabetes mellitus (CMS-HCC)     Diabetic nephropathy (CMS-HCC)     ESRD (end stage renal disease) on dialysis    (CMS-HCC)     ESRD on dialysis (CMS-HCC)     Gout     Hyperlipidemia     Hypertension    [2]   Past Surgical History:  Procedure Laterality Date    AV FISTULA PLACEMENT      PR COLONOSCOPY FLX DX W/COLLJ SPEC WHEN PFRMD N/A 04/30/2018    Procedure: COLONOSCOPY, FLEXIBLE, PROXIMAL TO SPLENIC FLEXURE; DIAGNOSTIC, W/WO COLLECTION SPECIMEN BY BRUSH OR WASH;  Surgeon: Dorn Lynwood Lauth, MD;  Location: [facility];  Service: Gastroenterology    PR COLONOSCOPY W/BIOPSY SINGLE/MULTIPLE N/A 04/30/2018    Procedure: COLONOSCOPY, FLEXIBLE, PROXIMAL TO SPLENIC FLEXURE; WITH BIOPSY, SINGLE OR MULTIPLE;  Surgeon: Dorn Lynwood Lauth, MD;  Location: [facility];  Service: Gastroenterology    PR COLSC FLX W/RMVL OF TUMOR POLYP LESION SNARE TQ N/A 04/30/2018    Procedure: COLONOSCOPY FLEX; W/REMOV TUMOR/LES BY SNARE;  Surgeon: Dorn Lynwood Lauth, MD;  Location[facility]; Service: Gastroenterology    PR EXPLORATORY OF ABDOMEN Midline 04/14/2016    Procedure: EXPLORATORY LAPAROTOMY, EXPLORATORY CELIOTOMY WITH OR WITHOUT BIOPSY(S);  Surgeon: Marsa Sam Boss, MD;  Location: MAIN OR [facility];  Service: Transplant    PR EXPLORATORY OF ABDOMEN N/A 04/16/2016    Procedure: EXPLORATORY LAPAROTOMY, EXPLORATORY CELIOTOMY WITH OR WITHOUT BIOPSY(S);  Surgeon: Marsa Sam Boss, MD;  Location: MAIN OR [facility];  Service: Transplant    PR LAP, RADICAL NEPHRECTOMY Right 12/27/2016    Procedure: Robotic Xi Laparoscopy; Radical Nephrectomy (Incl Remove Gerota`S Fascia, Fatty Tissue, Reg Lymph Node, Adrenalectomy);  Surgeon: Clorinda Ole Chalk, MD;  Location: MAIN OR [facility];  Service: Urology    PR LIGATN ANGIOACCESS AV FISTULA Left 11/29/2022    Procedure: LIGATION OR BANDING OF ANGIOACCESS ARTERIOVENOUS FISTULA;  Surgeon: Toledo, Marsa Sam, MD;  Location: MA[facility];  Service: Transplant    PR NEGATIVE PRESSURE WOUND THERAPY DME >50 SQ CM N/A 04/12/2016    Procedure: NEG PRESS WOUND TX (  VAC ASSIST) INCL TOPICALS, PER SESSION, TSA GREATER THAN/= 50 CM SQUARED;  Surgeon: Marsa Sam Boss, MD;  Location: MAIN OR [facility];  Service: Transplant    PR NEGATIVE PRESSURE WOUND THERAPY DME >50 SQ CM Bilateral 04/14/2016    Procedure: NEG PRESS WOUND TX (VAC ASSIST) INCL TOPICALS, PER SESSION, TSA GREATER THAN/= 50 CM SQUARED;  Surgeon: Marsa Sam Boss, MD;  Location: MAIN OR [place];  Service: Transplant    PR NEGATIVE PRESSURE WOUND THERAPY DME >50 SQ CM N/A 04/16/2016    Procedure: NEG PRESS WOUND TX (VAC ASSIST) INCL TOPICALS, PER SESSION, TSA GREATER THAN/= 50 CM SQUARED;  Surgeon: Marsa Sam Boss, MD;  Location: MAIN OR [facility];  Service: Transplant    PR NEPHRECTOMY, W/PART. URETECTOMY Bilateral 04/11/2016    Procedure: LAPAROSCOPY, SURGICAL, NEPHRECTOMY WITH TOTAL URETERECTOMY;  Surgeon: Marsa Sam Boss, MD;  Location: MAIN OR [facility];  Service: Transplant    PR REMV KIDNEY,W/RIB RESECTION Bilateral 04/11/2016    Procedure: NEPHRECTOMY, INCLUDING PARTIAL URETERECTOMY, ANY OPEN APPROACH INCLUDING RIB RESECTION;  Surgeon: Marsa Sam Boss, MD;  Location: MAIN OR [facility];  Service: Transplant    PR TRANSPLANT,PREP LIVING  RENAL GRAFT N/A 06/22/2019    Procedure: BACKBENCH STD PREP LIVING DONOR RENAL ALLGRFT (OPEN/LAPROSC) PRIOR TO TRANSPLANT, INC DISSECT/REM AS NECESS;  Surgeon: Marsa Sam Boss, MD;  Location: MAIN OR [facility];  Service: Transplant    PR TRANSPLANTATION OF KIDNEY N/A 06/22/2019    Procedure: RENAL ALLOTRANSPLANTATION, IMPLANTATION OF GRAFT; WITHOUT RECIPIENT NEPHRECTOMY;  Surgeon: Marsa Sam Boss, MD;  Location: MAIN OR [facility];  Service: Transplant    SPLENECTOMY     [3]   No current facility-administered medications on file prior to encounter.     Current Outpatient Medications on File Prior to Encounter   Medication Sig    acetaminophen  (TYLENOL ) 500 MG tablet Take 1-2 tablets (500-1,000 mg total) by mouth every six (6) hours as needed for pain or fever (> 38C).    allopurinol  (ZYLOPRIM ) 100 MG tablet Take 1 tablet (100 mg total) by mouth every evening.    atorvastatin  (LIPITOR ) 40 MG tablet Take 1 tablet (40 mg total) by mouth daily.    dexAMETHasone  (DECADRON ) 1 MG tablet Take 4 tablets (4 mg total) by mouth every twelve (12) hours for 3 days, THEN 4 tablets (4 mg total) daily for 3 days, THEN 2 tablets (2 mg total) daily for 3 days, THEN 1 tablet (1 mg total) daily for 3 days.    ENVARSUS  XR 4 mg Tb24 extended release tablet Take 1 tablet (4 mg total) by mouth daily. Take in addition to ONE 1 mg tablets for a total daily dose of 5 mg.    latanoprost (XALATAN) 0.005 % ophthalmic solution Administer 1 drop to both eyes nightly.    magnesium  oxide-Mg AA chelate (MAGNESIUM , AMINO ACID CHELATE,) 133 mg Take 1 tablet by mouth two (2) times a day.    metFORMIN  (GLUCOPHAGE -XR) 500 MG 24 hr tablet Take 1 tablet (500 mg total) by mouth in the morning. tacrolimus  (ENVARSUS  XR) 1 mg Tb24 extended release tablet Take 1 tablet (1 mg total) by mouth in the morning. Take one tablet daily with ONE 4 mg tablet for a total daily dose of 5mg .    [Paused] amlodipine  (NORVASC ) 10 MG tablet Take 1 tablet (10 mg total) by mouth daily. (Patient not taking: Reported on 07/11/2024)    [EXPIRED] flu vacc 2025-26 (65 yr up) (PF)(FLUAD )45 mcg(15mcgx3)/0.5 ml IM syringe Inject 0.5 mL into the muscle  once for 1 dose.    [Paused] mycophenolate  (MYFORTIC ) 180 MG EC tablet Take 2 tablets (360 mg total) by mouth two (2) times a day. (Patient not taking: Reported on 07/11/2024)    [Paused] olmesartan  (BENICAR ) 40 MG tablet Take 1 tablet (40 mg total) by mouth daily. (Patient not taking: Reported on 07/11/2024)    [START ON 07/20/2024] predniSONE  (DELTASONE ) 5 MG tablet Take 1 tablet (5 mg total) by mouth daily. (Patient not taking: Reported on 07/11/2024)   [4]   Family History  Problem Relation Age of Onset    Kidney disease Mother     Cancer Father     Kidney disease Brother    [5]   Social History  Tobacco Use    Smoking status: Never    Smokeless tobacco: Never   Vaping Use    Vaping status: Never Used   Substance Use Topics    Alcohol use: No    Drug use: No

## 2024-07-11 NOTE — Plan of Care (Signed)
 Pt admitted from OR at 1700; no complaints of pain. Neuro exam assessed with neurosurgery and NCC team. Family updated and visiting at bedside. All alarms active and audible; all safety measures in place. See flowsheets for details.       Problem: Adult Inpatient Plan of Care  Goal: Absence of Hospital-Acquired Illness or Injury  Intervention: Prevent Skin Injury  Recent Flowsheet Documentation  Taken 07/11/2024 1700 by Juliana Shuck, RN  Positioning for Skin: Left  Intervention: Prevent and Manage VTE (Venous Thromboembolism) Risk  Recent Flowsheet Documentation  Taken 07/11/2024 1700 by Juliana Shuck, RN  Anti-Embolism Device Type: SCD, Knee  Anti-Embolism Device Status: On  Anti-Embolism Device Location: BLE  Goal: Optimal Comfort and Wellbeing  Outcome: Shift Focus

## 2024-07-11 NOTE — Consults (Signed)
 Tacrolimus  Therapeutic Monitoring Pharmacy Note    Frank Leach is a 73 y.o. male continuing tacrolimus .     Indication: Kidney transplant     Date of Transplant: 06/22/2019      Prior Dosing Information: Home regimen Envarsus  5 mg PO daily     Source(s) of information used to determine prior to admission dosing: Patient/Caregiver, Home Medication List, or Clinic Note    Goals:  Therapeutic Drug Levels  Tacrolimus  trough goal: 4-6 ng/mL    Additional Clinical Monitoring/Outcomes  Monitor renal function (SCr and urine output) and liver function (LFTs)  Monitor for signs/symptoms of adverse events (e.g., hyperglycemia, hyperkalemia, hypomagnesemia, hypertension, headache, tremor)    Previous Lab Results:  Tacrolimus , Trough   Date Value Ref Range Status   07/05/2024 5.1 5.0 - 15.0 ng/mL Final   07/03/2024 7.6 5.0 - 15.0 ng/mL Final   06/09/2024 5.3 5.0 - 15.0 ng/mL Final   05/07/2024 4.9 (L) 5.0 - 15.0 ng/mL Final   04/08/2024 4.8 (L) 5.0 - 15.0 ng/mL Final     Tacrolimus , Timed   Date Value Ref Range Status   07/07/2024 3.9 ng/mL Final     Creatinine   Date Value Ref Range Status   07/08/2024 1.82 (H) 0.73 - 1.18 mg/dL Final   87/98/7974 8.22 (H) 0.73 - 1.18 mg/dL Final   88/69/7974 8.09 (H) 0.73 - 1.18 mg/dL Final        Result:  Most recent tacrolimus  levels appear therapeutic (~4-5)    Pharmacokinetic Considerations and Significant Drug Interactions:  Concurrent CYP3A4 substrates/inhibitors: nicardipine  -> amlodipine  (nicard gtt to d/c once amlodipine  takes more effect)    Assessment/Plan:  Recommendation(s)  Continue current regimen of Envarsus  5 mg PO daily    Follow-up  Daily levels have been ordered at 0600.   A pharmacist will continue to monitor and recommend levels as appropriate    Please page service pharmacist with questions/clarifications.    Mabel Call, PharmD

## 2024-07-11 NOTE — Progress Notes (Signed)
 UNOS form

## 2024-07-11 NOTE — Op Note (Signed)
 73yM w/ RCC and bilateral nephrectomies with renal transplant found to have a left frontoparietal enhancing lesion here for resection. Risks, benefits, alternatives discussed including bleeding, infection, stroke, necrosis, progression, additional treatment, breathing/feeding tube dependence and other medical and surgical complications. Discussed plan to continue immunosuppressive tacrolimus  due to kidney transplant rejection risk despite possible elevated postoperative infection risk. Discussed plan for intraoperative assessment of possible pathology that will guide resection. Discussed likely temporary weakness but possible permanent weakness. Patient taken to the OR and intubated under general anesthesia. Head was fixed to the [place] and registered to the stereotactic navigation system. The incision was mapped, prepped and draped. The craniotomy was performed with high speed drills and rongeurs. The dura was opened sharply. The cortical draining veins were preserved. The surface was mapped, and a transsulcal approach was used to approach the lesion for biopsy sampling. The frozen was consistent with a primary lesion that did not appear to be lymphoma. The resection was then carried out with the aid of the stereotactic navigation to define the tumor boundaries, in addition to ultrasound and 5ALA which was positive. Positive motor stimulation was obtained at the margins at 5mA. The resection was complete at this point. Monitoring remained stable. The microscope was used for microdissection. The cavity was copiously irrigated. The dura was reapproximated and duragen onlay placed with tisseal. The bone flap was replaced and the incision closed in a multilayer fashion. The patient was returned to anesthesia.        I was present during all key or critical portions of the procedure and immediately available to furnish services throughout the entire duration of the procedure. Rosaria CHRISTELLA Lush, MD

## 2024-07-12 LAB — PHOSPHORUS: PHOSPHORUS: 3.7 mg/dL (ref 2.4–5.1)

## 2024-07-12 LAB — BASIC METABOLIC PANEL
ANION GAP: 11 mmol/L (ref 5–14)
BLOOD UREA NITROGEN: 24 mg/dL — ABNORMAL HIGH (ref 9–23)
BUN / CREAT RATIO: 16
CALCIUM: 8.1 mg/dL — ABNORMAL LOW (ref 8.7–10.4)
CHLORIDE: 114 mmol/L — ABNORMAL HIGH (ref 98–107)
CO2: 17 mmol/L — ABNORMAL LOW (ref 20.0–31.0)
CREATININE: 1.47 mg/dL — ABNORMAL HIGH (ref 0.73–1.18)
EGFR CKD-EPI (2021) MALE: 51 mL/min/1.73m2 — ABNORMAL LOW (ref >=60)
GLUCOSE RANDOM: 156 mg/dL (ref 70–179)
POTASSIUM: 4 mmol/L (ref 3.4–4.8)
SODIUM: 142 mmol/L (ref 135–145)

## 2024-07-12 LAB — CBC
HEMATOCRIT: 35 % — ABNORMAL LOW (ref 39.0–48.0)
HEMOGLOBIN: 11.3 g/dL — ABNORMAL LOW (ref 12.9–16.5)
MEAN CORPUSCULAR HEMOGLOBIN CONC: 32.4 g/dL (ref 32.0–36.0)
MEAN CORPUSCULAR HEMOGLOBIN: 28.8 pg (ref 25.9–32.4)
MEAN CORPUSCULAR VOLUME: 88.8 fL (ref 77.6–95.7)
MEAN PLATELET VOLUME: 9 fL (ref 6.8–10.7)
PLATELET COUNT: 178 10*9/L (ref 150–450)
RED BLOOD CELL COUNT: 3.94 10*12/L — ABNORMAL LOW (ref 4.26–5.60)
RED CELL DISTRIBUTION WIDTH: 15.3 % — ABNORMAL HIGH (ref 12.2–15.2)
WBC ADJUSTED: 21.1 10*9/L — ABNORMAL HIGH (ref 3.6–11.2)

## 2024-07-12 LAB — MAGNESIUM: MAGNESIUM: 1.4 mg/dL — ABNORMAL LOW (ref 1.6–2.6)

## 2024-07-12 LAB — TACROLIMUS LEVEL, TROUGH: TACROLIMUS, TROUGH: 1.5 ng/mL — ABNORMAL LOW (ref 5.0–15.0)

## 2024-07-12 MED ADMIN — famotidine (PEPCID) tablet 20 mg: 20 mg | GASTROENTERAL | @ 01:00:00

## 2024-07-12 MED ADMIN — tacrolimus (ENVARSUS XR) extended release tablet 5 mg: 5 mg | ORAL | @ 16:00:00

## 2024-07-12 MED ADMIN — levETIRAcetam (KEPPRA) tablet 500 mg: 500 mg | ORAL | @ 13:00:00 | Stop: 2024-07-18

## 2024-07-12 MED ADMIN — famotidine (PEPCID) tablet 20 mg: 20 mg | ORAL | @ 13:00:00

## 2024-07-12 MED ADMIN — dexAMETHasone (DECADRON) tablet 6 mg: 6 mg | ORAL | Stop: 2024-07-15

## 2024-07-12 MED ADMIN — dexAMETHasone (DECADRON) tablet 6 mg: 6 mg | ORAL | @ 17:00:00 | Stop: 2024-07-15

## 2024-07-12 MED ADMIN — atorvastatin (LIPITOR) tablet 40 mg: 40 mg | ORAL | @ 13:00:00

## 2024-07-12 MED ADMIN — magnesium oxide (MAG-OX) tablet 400 mg: 400 mg | ORAL | @ 11:00:00 | Stop: 2024-07-12

## 2024-07-12 MED ADMIN — insulin regular (HumuLIN,NovoLIN) inj CORRECTIONAL 0-9 Units: 0-9 [IU] | SUBCUTANEOUS | @ 21:00:00

## 2024-07-12 MED ADMIN — insulin regular (HumuLIN,NovoLIN) inj CORRECTIONAL 0-9 Units: 0-9 [IU] | SUBCUTANEOUS | @ 17:00:00

## 2024-07-12 MED ADMIN — insulin regular (HumuLIN,NovoLIN) inj CORRECTIONAL 0-5 Units: 0-5 [IU] | SUBCUTANEOUS | @ 01:00:00

## 2024-07-12 MED ADMIN — magnesium sulfate in D5W 1 gram/100 mL infusion 1 g: 1 g | INTRAVENOUS | @ 13:00:00 | Stop: 2024-07-12

## 2024-07-12 MED ADMIN — gadopiclenol (ELUCIREM,VUEWAY) injection 7.05 mL: .1 mL/kg | INTRAVENOUS | @ 05:00:00 | Stop: 2024-07-12

## 2024-07-12 MED ADMIN — dexAMETHasone (DECADRON) tablet 6 mg: 6 mg | ORAL | @ 12:00:00 | Stop: 2024-07-12

## 2024-07-12 MED ADMIN — insulin NPH (HumuLIN,NovoLIN) injection 7 Units: 7 [IU] | SUBCUTANEOUS | @ 21:00:00 | Stop: 2024-07-12

## 2024-07-12 NOTE — Consults (Signed)
 Tacrolimus  Therapeutic Monitoring Pharmacy Note    Frank Leach is a 73 y.o. male continuing tacrolimus .     Indication: Kidney transplant     Date of Transplant: 06/22/2019      Prior Dosing Information: Home regimen Envarsus  5 mg PO daily     Source(s) of information used to determine prior to admission dosing: Patient/Caregiver, Home Medication List, or Clinic Note    Goals:  Therapeutic Drug Levels  Tacrolimus  trough goal: 4-6 ng/mL    Additional Clinical Monitoring/Outcomes  Monitor renal function (SCr and urine output) and liver function (LFTs)  Monitor for signs/symptoms of adverse events (e.g., hyperglycemia, hyperkalemia, hypomagnesemia, hypertension, headache, tremor)    Previous Lab Results:  Tacrolimus , Trough   Date Value Ref Range Status   07/12/2024 1.5 (L) 5.0 - 15.0 ng/mL Final   07/05/2024 5.1 5.0 - 15.0 ng/mL Final   07/03/2024 7.6 5.0 - 15.0 ng/mL Final   06/09/2024 5.3 5.0 - 15.0 ng/mL Final   05/07/2024 4.9 (L) 5.0 - 15.0 ng/mL Final     Tacrolimus , Timed   Date Value Ref Range Status   07/07/2024 3.9 ng/mL Final     Creatinine   Date Value Ref Range Status   07/12/2024 1.47 (H) 0.73 - 1.18 mg/dL Final   87/97/7974 8.17 (H) 0.73 - 1.18 mg/dL Final   87/98/7974 8.22 (H) 0.73 - 1.18 mg/dL Final        Result:  Most recent tacrolimus  levels appear therapeutic (~4-5)  - 12/06 AM (0400) level appears subtherapeutic (1.5)    Pharmacokinetic Considerations and Significant Drug Interactions:  Concurrent CYP3A4 substrates/inhibitors: nicardipine  -> amlodipine  (nicard gtt to d/c once amlodipine  takes more effect)    Assessment/Plan:  Recommendation(s)  Continue current regimen of Envarsus  5 mg PO daily  Given that patient was admitted yesterday and unknown status of previous dose, will delay adjusting dose to Sunday 12/07 AM lab    Follow-up  Daily levels have been ordered at 0600.   A pharmacist will continue to monitor and recommend levels as appropriate    Please page service pharmacist with questions/clarifications.    [name], PharmD

## 2024-07-12 NOTE — Progress Notes (Signed)
 Neurocritical Care Daily Progress Note     CODE STATUS:    Code Status: Full Code    HCDM (patient stated preference): Leach,Frank - Spouse - [phone]    Date of service: 07/12/2024  Hospital Day:  LOS: 1 day        HPI     Frank Leach is a 73 y.o. 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 He is now POD 0 L crani for mass resection     Assessment and Plan     [name] is a 73 y.o. male admitted to NSICU with a diagnosis of brain mass.     Interval Events:   - bradycardic to 40's, asymptomatic, BP stable, EKG w/ sinus brady and known 1st degree block    Neuro:  Mobility goal: 2 (HOB elevation)    **Brain tumor, s/p L crani 12/5  - post-op MRI w w/o 11/5 (personally reviewed) with expected post-op changes and potentially some residual contrast enhancement  - keppra  500mg  BID for post-op seizure prophylaxis x 7 days  - decadron  taper per SRN  - q4h neuro checks  - obtain PT/OT consults    **Pain  - tylenol  prn mild pain  - oxycodone  prn moderate pain  - fentanyl  prn severe pain    **Family communication   - Family updated by [facility] post-op    Cardiovascular:  Admission weight: 72.6  Daily Weight: 70.5 kg (155 lb 6.8 oz)     Hemodynamic goals:  - MAP > 65  - SBP < 160    **Hypertensive emergency  - nicardipine  off  - holding home meds- valsartan  160, amlo 10 (outpatient was hypotensive- unsure if he was taking)  - ECG with sinus brady and known 1st degree block    **Hyperlipidemia  - continue atorva 40mg      Pulmonary    - Stable on RA  - Titrate O2 for sat > 92%      FEN  Body mass index is 24.34 kg/m??.    GI prophylaxis: H2 blocker (steroids)  Last BM Date:  (PTA)  Foley catheter: Foley to be removed today.      **Bladder mass  - CT abd/pelvis showed bilobed mass or adjacent synchronous masses in right anterior bladder wall c/f malignant urothelial neoplasm.   - urology was consulted last admission and recommended outpatient cystoscopy and tissue diagnosis including potential TURT    **H/O renal cell carcinoma s/p bilateral nephrectomy and kidney transplant 2020  **ESRD- previously requiring HD  - baseline creatinine 1.5-1.9 per chart review  - dose-adjust all medications for renal function  - avoid nephrotoxic meds; minimize contrast exposure  - strict I/Os; daily weight  - continue Tacrolimus  (envarsus ) 5mg  daily  - Transplant consulted - holding mycophenalate in setting of new malignancy  - goal net even, encourage PO intake    **Nutrition  - advance diet as tolerated    **Nausea  - zofran  prn    **Type 2 diabetes mellitus/steroid induced hyperglycemia  - A1c 6.8  - POCT ACHS, SSI    **Fluids/electrolytes  - foley now for strict ins/outs, d/c foley on POD#1  - obtain post-op chemistry and supplement electrolytes as needed    **Hypomagnesemia  - replete and recheck    Recent Labs  07/12/24  0409   BUN 24*   CREATININE 1.47*        Heme/ID  Current DVT prophylaxis: SCDs, No chemical DVT prophylaxis due to bleeding risk    - Leukocytosis ISO steroids and procedure, afebrile  - s/p vanc/Cefazolin  x1 in OR      Current Access:         -  PIV x 2       - Arterial line: Arterial line required for BP monitoring.       - Central venous line: No central line present.    Tubes and drains:  Patient Lines/Drains/Airways Status       Active Active Lines, Drains, & Airways       Name Placement date Placement time Site Days    Urethral Catheter Non-latex;Temperature probe 16 Fr. 07/11/24  1136  Non-latex;Temperature probe  less than 1    Peripheral IV 07/11/24 Anterior;Right Forearm 07/11/24  1035  Forearm  less than 1    Peripheral IV 07/11/24 Left Foot 07/11/24  1120  Foot  less than 1    Peripheral IV 07/11/24 Left Hand 07/11/24  1120  Hand  less than 1    Arterial Line 07/11/24 Left Radial 07/11/24  1504  Radial  less than 1                           Objective Data     All vital signs and resulted laboratory studies for the past 24 hours have been reviewed.  All ordered medications have been reviewed.    Temp:  [36.5 ??C (97.7 ??F)-36.7 ??C (98.1 ??F)] 36.5 ??C (97.7 ??F)  Pulse:  [43-97] 43  SpO2 Pulse:  [42-95] 42  Resp:  [15-27] 17  BP: (117-191)/(56-83) 130/61  MAP (mmHg):  [77-112] 86  A BP-2: (117-147)/(45-58) 134/55  MAP:  [63 mmHg-86 mmHg] 80 mmHg  SpO2:  [92 %-100 %] 98 %    Intake/Output Summary (Last 24 hours) at 07/12/2024 0821  Last data filed at 07/12/2024 0600  Gross per 24 hour   Intake 1420.41 ml   Output 2225 ml   Net -804.59 ml        Physical Exam     General Exam:  General: Awake and alert.   Appears comfortable. No obvious distress.   ENT:  Mucous membranes moist. Oropharynx clear.  Cardiovascular:  Regular rate and rhythm.  2+ radial pulses bilaterally.    Respiratory: Breathing is comfortable and unlabored.   Gastrointestinal: Soft, nontender, nondistended.  Extremities: Warm and well-perfused. No cyanosis, clubbing, or edema.  Skin: No obvious rashes or ecchymoses.    Neurological Exam:  Mental Status  LOC: awake and alert  Orientation: person, place and time  Orientation: stuttering, pt stated baseline  Neglect: not present  Speech: normal  Language: fluent with intact naming, repetition, comprehension    Cranial Nerves  Left pupil brisk > Right pupil and sluggish- present on admission  Gaze: normal  Face Motor: normal and symmetric    Motor:   RUE: commands and 3/5  LUE: commands and 4+/5  RLE: commands and 2/5  LLE: commands and 4+/5     Sensory:    Sensory is intact to light touch    Glasgow Coma Score  Motor: obeys commands = 6  Verbal: oriented = 5  Eyes: eyes open spontaneously = 4    DISPOSITION      The patient is stable to transfer to  an acute care bed.    Estimated Transfer Date:  12/6    BILLING     This is a subsequent evaluation of the patient.      Morna Zada Hay, MD  Neurocritical Care

## 2024-07-12 NOTE — Consults (Signed)
 OCCUPATIONAL THERAPY  Evaluation (07/12/24 1055)    Patient Name:  Frank Leach       Medical Record Number: [id number]     Date of Birth: 1951/04/15  Sex: Male      Post-Discharge Occupational Therapy Recommendations: 5x weekly, High intensity      Equipment Recommendation  OT DME Recommendations: Defer to post acute       OT Treatment Diagnosis: Cognitive changes due to medical disorder, Generalized muscle weakness, Need for assistance with personal care, Unsteadiness on feet, Reduced mobility         Assessment  Assessment: Frank Leach is a 73 y.o. 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 He is now POD 1 L crani for mass resection At this time, the patient presents with the problem list documented below, all of which limit activities of daily living (ADL) and functional mobility. This represents a decline from their baseline status of independent with ADL, IADL, and being active int he community. This indicates that the patient would benefit from occupational therapy services to maximize functional independence and safety for a return to desired roles and occupations. Following a review of the patient's occupational profile and history, assessment of occupational performance, clinical decision-making, and development of the plan of care (POC), the patient presents as a High complexity case.    Problem List: Decreased activity tolerance, Decreased cognition, Decreased coordination, Decreased endurance, Decreased mobility, Decreased safety awareness, Decreased strength, Fall risk, Gait deviation, Impaired ADLs, Impaired balance, Impaired fine motor skills    Personal Factors/Comorbidities (Occupational Profile and History Review): Extensive (High)  Assessment of Occupational Performance : Balance, Cognitive skills, Dexterity, Endurance, Fine or gross motor coordination, Mobility, Strength  Clinical Decision Making: High Complexity    Today's Interventions: Prolonged time sitting EOB and engaging in ADL including oral hygiene and face washing. Encouraged pt to utilize R hand during ADL to improve overall function, strength and coordination as he is R handed. Advised to utilize R hand as support/assist only when having to handle hot or sharp items. Reviewed AROM to engage in throughout the day including SLR, ankle pumps, and hand AROM focusing on extension and prehension.    Activity Tolerance During Today's Session  Tolerated treatment well    Plan  Planned Frequency of Treatment: Plan of Care Initiated: 07/12/24  1-2x per day Weekly Frequency: 4-5 days per week  Planned Treatment Duration: 07/26/24    Planned Interventions:  Education (Patient/Family/Caregiver), Clinical Research Associate, Home Exercise Program, Therapeutic Exercise, Therapeutic Activity, Self-Care/Home Training, Neuromuscular Re-education      GOALS:   Patient and Family Goals: I want to get back to walking and being able to do everything    Short Term:   SHORT GOAL #1: Patient will perform functional transfers and all aspect of toileting with Min A using LRAD   Time Frame : 2 weeks  SHORT GOAL #2: Patient will perform LB dressing with Min A using compensatory strategies   Time Frame : 2 weeks  SHORT GOAL #3: Patient will perform sponge bath tasks with Set up using compensatory strategies   Time Frame : 2 weeks    Long Term Goal #1: Patient will score 20+/24 on AMPAC  Time Frame: 4 weeks    Prognosis:  Excellent  Positive Indicators:  PLOF, Supportive spouse  Barriers to Discharge: Impaired Balance, Gait instability, Functional strength deficits, Inability to safely perform ADLS    Subjective  Prior Functional Status At baseline he is active and Independent with ADLs, IADLs, and functional mobility skills without assistive device. Active, enjoys yard work. No recent falls. Spouse works outside the home 4 days week ~ 3 hours. She is able to provide support as needed. Used to work as a arboriculturist.    Living Situation  Living environment: House  Lives With: Spouse  Home Living: One level home, Stairs to enter with rails, Tub/shower unit, Shower chair without back, Standard height toilet, Bedside commode  Rail placement (outside): Bilateral rails in reach  Number of Stairs to Enter (outside): 2  Caregiver Identified?: Yes  Caregiver Availability: Intermittent  Caregiver Ability: Limited lifting     Equipment available at home: Bedside commode, Rolling walker, Straight cane    Medical Tests / Procedures: Left craniotomy for resection of brain mass (12/5)       Patient / Caregiver reports: I usually start my day with a 1 mile walk so this is very different      Past Medical History[1] Social History     Tobacco Use    Smoking status: Never    Smokeless tobacco: Never   Substance Use Topics    Alcohol use: No      Past Surgical History[2] Family History[3]     Patient has no known allergies.     Objective Findings  Precautions / Restrictions  Falls precautions, Other precautions    Light precautions until 10:28 AM 12/7    Communication Preference  Verbal       Pain  Denies pain throughout    Equipment / Environment  Vascular access (PIV, TLC, Port-a-cath, PICC), Telemetry, Arterial line    Cognition   Orientation Level:  Oriented x 4   Arousal/Alertness:  Appropriate responses to stimuli   Attention Span:  Attends with cues to redirect   Memory:  Appears intact   Following Commands:  Follows one-step commands   Safety Judgment:  Decreased awareness of need for safety   Awareness of Errors and Problem Solving:  Assistance required to identify errors made   Comments:      Vision / Hearing   Vision: No acute deficits identified, Wears glasses all the time     Hearing: No deficit identified         Hand Function:  Right Hand Function: Right hand function impaired  Right Hand Impairment: grip strength fair, coordination impaired  Left Hand Function: Left hand grip strength, ROM and coordination WNL  Hand Dominance: Right    Skin Inspection:  Skin Inspection: Dressing C/D/I    Face/Cervical ROM:  Face ROM: WFL  Cervical ROM: WFL    ROM / Strength:  UE ROM/Strength: Left WFL, Right Impaired/Limited  RUE Impairment: Reduced strength, Limited AROM  LE ROM/Strength: Left WFL, Right Impaired/Limited  RLE Impairment: Reduced strength, Limited AROM    Sensation:  RUE Sensation: RUE intact  LUE Sensation: LUE intact  RLE Sensation: RLE intact  LLE Sensation: LLE intact    Balance:  Static Sitting-Level of Assistance: Stand by Camera Operator of Assistance: Stand by Airline Pilot Standing-Level of Assistance: Minimum assistance  Dynamic Standing - Level of Assistance: Moderate assistance    Functional Mobility  Transfers: Min assist  Bed Mobility : Standby assist    Ambulation  Level of Assistance: Minimal assist, patient  does 75% or more  Assistive Device:  (Pt held SPT to bedside chair only)    ADLs  Feeding : Min assist  Grooming: Min assist  Bathing: Min assist  Toileting: Mod assist  UB Dressing: Mod assist  LB Dressing: Mod assist    IADLs: NT    Vitals / Orthostatics  Vitals/Orthostatics: VSS throughout    Patient at end of session: All needs in reach, Alarm activated, Friends/Family present, In chair, Lines intact, Staff present     Occupational Therapy Session Duration  OT Individual [mins]: 48       AM-PAC Daily Activity Assessment  Lower Body Dressing assistance needs: A lot - Maximum/Moderate Assistance  Bathing assistance needs: A Little - Minimal/Contact Guard Assist/Supervision  Toileting assistance needs: A lot - Maximum/Moderate Assistance  Upper Body Dressing assistance needs: A lot - Maximum/Moderate Assistance  Personal Grooming assistance needs: A Little - Minimal/Contact Guard Assist/Supervision  Eating Meals assistance needs: A Little - Minimal/Contact Guard Assist/Supervision      Daily Activity Score: 15    Score (in points): % of Functional Impairment, Limitation, Restriction  5: 100% impaired, limited, restricted  6-7: At least 80%, but less than 100% impaired, limited restricted  8-11: At least 60%, but less than 80% impaired, limited restricted  12-16: At least 40%, but less than 60% impaired, limited restricted  17-18: At least 20%, but less than 40% impaired, limited restricted  19: At least 1%, but less than 20% impaired, limited restricted  20: 0% impaired, limited restricted    'AM-PAC' forms are Copyright protected by The [employer] [facility]       I attest that I have reviewed the above information.  Signed: Glade JINNY Helena, OT  Filed 07/12/2024                 [1]   Past Medical History:  Diagnosis Date    Anemia     Cancer    (CMS-HCC)     clear cell adenocarcinoma of left kidney    Diabetes mellitus (CMS-HCC)     Diabetic nephropathy (CMS-HCC)     ESRD (end stage renal disease) on dialysis    (CMS-HCC)     ESRD on dialysis (CMS-HCC)     Gout     Hyperlipidemia     Hypertension    [2]   Past Surgical History:  Procedure Laterality Date    AV FISTULA PLACEMENT      PR COLONOSCOPY FLX DX W/COLLJ SPEC WHEN PFRMD N/A 04/30/2018    Procedure: COLONOSCOPY, FLEXIBLE, PROXIMAL TO SPLENIC FLEXURE; DIAGNOSTIC, W/WO COLLECTION SPECIMEN BY BRUSH OR WASH;  Surgeon: Dorn Lynwood Lauth, MD;  Location: [facility];  Service: Gastroenterology    PR COLONOSCOPY W/BIOPSY SINGLE/MULTIPLE N/A 04/30/2018    Procedure: COLONOSCOPY, FLEXIBLE, PROXIMAL TO SPLENIC FLEXURE; WITH BIOPSY, SINGLE OR MULTIPLE;  Surgeon: Dorn Lynwood Lauth, MD;  Location: [facility];  Service: Gastroenterology    PR COLSC FLX W/RMVL OF TUMOR POLYP LESION SNARE TQ N/A 04/30/2018    Procedure: COLONOSCOPY FLEX; W/REMOV TUMOR/LES BY SNARE;  Surgeon: Dorn Lynwood Lauth, MD; Location: [facility];  Service: Gastroenterology    PR EXPLORATORY OF ABDOMEN Midline 04/14/2016    Procedure: EXPLORATORY LAPAROTOMY, EXPLORATORY CELIOTOMY WITH OR WITHOUT BIOPSY(S);  Surgeon: Marsa Sam Boss, MD;  Location: [facility] OR [facility];  Service: Transplant    PR EXPLORATORY OF ABDOMEN N/A 04/16/2016    Procedure: EXPLORATORY LAPAROTOMY, EXPLORATORY CELIOTOMY WITH  OR WITHOUT BIOPSY(S);  Surgeon: Marsa Sam Boss, MD;  Location: MAIN OR [facility];  Service: Transplant    PR LAP, RADICAL NEPHRECTOMY Right 12/27/2016    Procedure: Robotic Xi Laparoscopy; Radical Nephrectomy (Incl Remove Gerota`S Fascia, Fatty Tissue, Reg Lymph Node, Adrenalectomy);  Surgeon: Clorinda Ole Chalk, MD;  Location: [facility];  Service: Urology    PR LIGATN ANGIOACCESS AV FISTULA Left 11/29/2022    Procedure: LIGATION OR BANDING OF ANGIOACCESS ARTERIOVENOUS FISTULA;  Surgeon: [place], Marsa Sam, MD;  Location: MAIN OR [place];  Service: Transplant    PR NEGATIVE PRESSURE WOUND THERAPY DME >50 SQ CM N/A 04/12/2016    Procedure: NEG PRESS WOUND TX (VAC ASSIST) INCL TOPICALS, PER SESSION, TSA GREATER THAN/= 50 CM SQUARED;  Surgeon: Marsa Sam Boss, MD;  Location: MAIN OR [place];  Service: Transplant    PR NEGATIVE PRESSURE WOUND THERAPY DME >50 SQ CM Bilateral 04/14/2016    Procedure: NEG PRESS WOUND TX (VAC ASSIST) INCL TOPICALS, PER SESSION, TSA GREATER THAN/= 50 CM SQUARED;  Surgeon: Marsa Sam Boss, MD;  Location: MAIN OR [place];  Service: Transplant    PR NEGATIVE PRESSURE WOUND THERAPY DME >50 SQ CM N/A 04/16/2016    Procedure: NEG PRESS WOUND TX (VAC ASSIST) INCL TOPICALS, PER SESSION, TSA GREATER THAN/= 50 CM SQUARED;  Surgeon: Marsa Sam Boss, MD;  Location: [facility] OR [facility];  Service: Transplant    PR NEPHRECTOMY, W/PART. URETECTOMY Bilateral 04/11/2016    Procedure: LAPAROSCOPY, SURGICAL, NEPHRECTOMY WITH TOTAL URETERECTOMY;  Surgeon: Marsa Sam Boss, MD;  Location: MAIN OR [facility];  Service: Transplant    PR REMV KIDNEY,W/RIB RESECTION Bilateral 04/11/2016    Procedure: NEPHRECTOMY, INCLUDING PARTIAL URETERECTOMY, ANY OPEN APPROACH INCLUDING RIB RESECTION;  Surgeon: Marsa Sam Boss, MD;  Location: MAIN OR [facility];  Service: Transplant    PR TRANSPLANT,PREP LIVING  RENAL GRAFT N/A 06/22/2019    Procedure: BACKBENCH STD PREP LIVING DONOR RENAL ALLGRFT (OPEN/LAPROSC) PRIOR TO TRANSPLANT, INC DISSECT/REM AS NECESS;  Surgeon: Marsa Sam Boss, MD;  Location: MAIN OR [facility];  Service: Transplant    PR TRANSPLANTATION OF KIDNEY N/A 06/22/2019    Procedure: RENAL ALLOTRANSPLANTATION, IMPLANTATION OF GRAFT; WITHOUT RECIPIENT NEPHRECTOMY;  Surgeon: Marsa Sam Boss, MD;  Location: MAIN OR [facility];  Service: Transplant    SPLENECTOMY     [3]   Family History  Problem Relation Age of Onset    Kidney disease Mother     Cancer Father     Kidney disease Brother

## 2024-07-12 NOTE — Plan of Care (Signed)
 Shift Summary  niCARdipine  was administered and rate was increased during the shift, with ongoing monitoring of neurological and renal status.  Pressure injury prevention measures were implemented, including dressing application and use of pressure-redistributing mattress.  gadopiclenol  was administered for MRI imaging as ordered.  Bed alarm, non-skid footwear, and hourly visual checks were maintained to prevent falls, with no fall events documented.  Laboratory results indicated abnormal renal function and elevated WBC, and overall status remained stable with no new hospital-acquired complications documented.    Absence of Hospital-Acquired Illness or Injury: Aseptic technique and cohorting were maintained, and perineal and catheter care were provided; no new hospital-acquired issues were documented during the shift. Laboratory results showed elevated WBC and abnormal renal function, but no new hospital-acquired complications were noted.    Optimal Comfort and Wellbeing: Pain remained at 0 throughout the shift, and no discomfort was reported or observed.    Absence of Fall and Fall-Related Injury: Bed alarm was consistently activated, hourly visual checks were performed, and non-skid footwear was used; no falls or injuries occurred during the shift.    Skin Health and Integrity: Bruising persisted and was noted throughout the shift, with skin assessed and dressing applied; absorbent pads and pressure-redistributing mattress were utilized, and adhesive use was limited.    Improved Ability to Complete Activities of Daily Living: Maximum assistance was required for mobility, and oral care was performed independently; no change in level of assistance was observed during the shift.    Optimal Coping: Psychosocial status remained within defined limits throughout the shift.      Problem: Adult Inpatient Plan of Care  Goal: Absence of Hospital-Acquired Illness or Injury  Outcome: Shift Focus  Intervention: Identify and Manage Fall Risk  Recent Flowsheet Documentation  Taken 07/12/2024 0600 by Jackson Browning, RN  Safety Interventions: bed alarm  Taken 07/12/2024 0400 by Jackson Browning, RN  Safety Interventions: bed alarm  Taken 07/12/2024 0200 by Jackson Browning, RN  Safety Interventions: bed alarm  Taken 07/12/2024 0000 by Jackson Browning, RN  Safety Interventions: bed alarm  Taken 07/11/2024 2200 by Jackson Browning, RN  Safety Interventions: bed alarm  Taken 07/11/2024 2000 by Jackson Browning, RN  Safety Interventions: bed alarm  Intervention: Prevent Skin Injury  Recent Flowsheet Documentation  Taken 07/12/2024 0600 by Jackson Browning, RN  Positioning for Skin: Supine/Back  Taken 07/12/2024 0400 by Jackson Browning, RN  Positioning for Skin: Left  Taken 07/12/2024 0200 by Jackson Browning, RN  Positioning for Skin: Supine/Back  Taken 07/12/2024 0000 by Jackson Browning, RN  Positioning for Skin: Supine/Back  Taken 07/11/2024 2200 by Jackson Browning, RN  Positioning for Skin: Supine/Back  Taken 07/11/2024 2000 by Jackson Browning, RN  Positioning for Skin: Right  Device Skin Pressure Protection: absorbent pad utilized/changed  Skin Protection: adhesive use limited  Intervention: Prevent and Manage VTE (Venous Thromboembolism) Risk  Recent Flowsheet Documentation  Taken 07/12/2024 0600 by Jackson Browning, RN  Anti-Embolism Device Type: SCD, Knee  Anti-Embolism Device Status: On  Anti-Embolism Device Location: BLE  Taken 07/12/2024 0400 by Jackson Browning, RN  Anti-Embolism Device Type: SCD, Knee  Anti-Embolism Device Status: On  Anti-Embolism Device Location: BLE  Taken 07/12/2024 0200 by Jackson Browning, RN  Anti-Embolism Device Type: SCD, Knee  Anti-Embolism Device Status: On  Anti-Embolism Device Location: BLE  Taken 07/12/2024 0000 by Jackson Browning, RN  Anti-Embolism Device Type: SCD, Knee  Anti-Embolism Device Status: On  Anti-Embolism Device Location: BLE  Taken 07/11/2024 2200 by Jackson Browning, RN  Anti-Embolism Device Type:  SCD, Knee  Anti-Embolism Device Status: On  Anti-Embolism Device Location: BLE  Taken 07/11/2024 2000 by Jackson Browning, RN  VTE Prevention/Management: bleeding precautions maintained  Anti-Embolism Device Type: SCD, Knee  Anti-Embolism Device Status: On  Anti-Embolism Device Location: BLE  Intervention: Prevent Infection  Recent Flowsheet Documentation  Taken 07/11/2024 2000 by Jackson Browning, RN  Infection Prevention: cohorting utilized  Goal: Optimal Comfort and Wellbeing  Outcome: Shift Focus     Problem: Fall Injury Risk  Goal: Absence of Fall and Fall-Related Injury  Outcome: Shift Focus  Intervention: Promote Injury-Free Environment  Recent Flowsheet Documentation  Taken 07/12/2024 0600 by Jackson Browning, RN  Safety Interventions: bed alarm  Taken 07/12/2024 0400 by Jackson Browning, RN  Safety Interventions: bed alarm  Taken 07/12/2024 0200 by Jackson Browning, RN  Safety Interventions: bed alarm  Taken 07/12/2024 0000 by Jackson Browning, RN  Safety Interventions: bed alarm  Taken 07/11/2024 2200 by Jackson Browning, RN  Safety Interventions: bed alarm  Taken 07/11/2024 2000 by Jackson Browning, RN  Safety Interventions: bed alarm     Problem: Skin Injury Risk Increased  Goal: Skin Health and Integrity  Outcome: Shift Focus  Intervention: Optimize Skin Protection  Recent Flowsheet Documentation  Taken 07/12/2024 0600 by Jackson Browning, RN  Activity Management: in bed  Head of Bed [facility]) Positioning: HOB at 30 degrees  Taken 07/12/2024 0400 by Jackson Browning, RN  Activity Management: in bed  Head of Bed [facility]) Positioning: HOB at 30 degrees  Taken 07/12/2024 0200 by Jackson Browning, RN  Activity Management: in bed  Head of Bed [facility]) Positioning: HOB at 30 degrees  Taken 07/12/2024 0000 by Jackson Browning, RN  Activity Management: in bed  Head of Bed [facility]) Positioning: HOB at 30 degrees  Taken 07/11/2024 2200 by Jackson Browning, RN  Activity Management: in bed  Head of Bed [facility]) Positioning: HOB at 30-45 degrees  Taken 07/11/2024 2000 by Jackson Browning, RN  Activity Management: in bed  Pressure Reduction Techniques: frequent weight shift encouraged  Head of Bed (HOB) Positioning: HOB at 30-45 degrees  Pressure Reduction Devices: pressure-redistributing mattress utilized  Skin Protection: adhesive use limited     Problem: Self-Care Deficit  Goal: Improved Ability to Complete Activities of Daily Living  Outcome: Shift Focus     Problem: Wound  Goal: Optimal Coping  Outcome: Shift Focus  Goal: Optimal Functional Ability  Intervention: Optimize Functional Ability  Recent Flowsheet Documentation  Taken 07/12/2024 0600 by Jackson Browning, RN  Activity Management: in bed  Taken 07/12/2024 0400 by Jackson Browning, RN  Activity Management: in bed  Taken 07/12/2024 0200 by Jackson Browning, RN  Activity Management: in bed  Taken 07/12/2024 0000 by Jackson Browning, RN  Activity Management: in bed  Taken 07/11/2024 2200 by Jackson Browning, RN  Activity Management: in bed  Taken 07/11/2024 2000 by Jackson Browning, RN  Activity Management: in bed  Goal: Absence of Infection Signs and Symptoms  Intervention: Prevent or Manage Infection  Recent Flowsheet Documentation  Taken 07/11/2024 2000 by Jackson Browning, RN  Infection Management: aseptic technique maintained  Goal: Skin Health and Integrity  Intervention: Optimize Skin Protection  Recent Flowsheet Documentation  Taken 07/12/2024 0600 by Jackson Browning, RN  Activity Management: in bed  Head of Bed [facility]) Positioning: HOB at 30 degrees  Taken 07/12/2024 0400 by Jackson Browning, RN  Activity Management: in bed  Head of Bed [facility]) Positioning: HOB at 30 degrees  Taken 07/12/2024 0200 by Jackson Browning, RN  Activity Management: in bed  Head of Bed [facility]) Positioning: HOB at 30 degrees  Taken 07/12/2024 0000 by Jackson Browning, RN  Activity Management: in bed  Head of Bed [facility]) Positioning: HOB at 30 degrees  Taken 07/11/2024 2200 by Jackson Browning, RN  Activity Management: in bed  Head of Bed [facility]) Positioning: HOB at 30-45 degrees  Taken 07/11/2024 2000 by Jackson Browning, RN  Activity Management: in bed  Pressure Reduction Techniques: frequent weight shift encouraged  Head of Bed (HOB) Positioning: HOB at 30-45 degrees  Pressure Reduction Devices: pressure-redistributing mattress utilized  Skin Protection: adhesive use limited

## 2024-07-12 NOTE — Plan of Care (Signed)
 Problem: Adult Inpatient Plan of Care  Goal: Absence of Hospital-Acquired Illness or Injury  Outcome: Shift Focus  Intervention: Identify and Manage Fall Risk  Recent Flowsheet Documentation  Taken 07/12/2024 0800 by Cristopher Ozell SAUNDERS, RN  Safety Interventions:   aspiration precautions   bed alarm   bleeding precautions   enteral feeding safety   family at bedside   infection management  Intervention: Prevent Skin Injury  Recent Flowsheet Documentation  Taken 07/12/2024 1600 by Cristopher Ozell SAUNDERS, RN  Positioning for Skin: Right  Device Skin Pressure Protection: absorbent pad utilized/changed  Skin Protection:   adhesive use limited   skin-to-skin areas padded   skin-to-device areas padded  Taken 07/12/2024 1400 by Cristopher Ozell SAUNDERS, RN  Positioning for Skin: Left  Device Skin Pressure Protection: absorbent pad utilized/changed  Skin Protection:   adhesive use limited   skin-to-skin areas padded   skin-to-device areas padded  Taken 07/12/2024 1200 by Cristopher Ozell SAUNDERS, RN  Positioning for Skin: Sitting in Chair  Device Skin Pressure Protection: absorbent pad utilized/changed  Skin Protection:   adhesive use limited   skin-to-skin areas padded   skin-to-device areas padded  Taken 07/12/2024 1000 by Cristopher Ozell SAUNDERS, RN  Positioning for Skin: Right  Device Skin Pressure Protection: absorbent pad utilized/changed  Skin Protection:   adhesive use limited   skin-to-skin areas padded   skin-to-device areas padded  Taken 07/12/2024 0800 by Cristopher Ozell SAUNDERS, RN  Positioning for Skin: Left  Device Skin Pressure Protection:   absorbent pad utilized/changed   adhesive use limited   positioning supports utilized   pressure points protected   skin-to-device areas padded   skin-to-skin areas padded  Skin Protection:   adhesive use limited   skin-to-device areas padded   skin-to-skin areas padded  Intervention: Prevent and Manage VTE (Venous Thromboembolism) Risk  Recent Flowsheet Documentation  Taken 07/12/2024 1600 by Cristopher Ozell SAUNDERS, RN  Anti-Embolism Device Type: SCD, Knee  Anti-Embolism Device Status: On  Anti-Embolism Device Location: BLE  Taken 07/12/2024 1400 by Cristopher Ozell SAUNDERS, RN  Anti-Embolism Device Type: SCD, Knee  Anti-Embolism Device Status: On  Anti-Embolism Device Location: BLE  Taken 07/12/2024 1200 by Cristopher Ozell SAUNDERS, RN  Anti-Embolism Device Type: SCD, Knee  Anti-Embolism Device Status: On  Anti-Embolism Device Location: BLE  Taken 07/12/2024 1000 by Cristopher Ozell SAUNDERS, RN  Anti-Embolism Device Type: SCD, Knee  Anti-Embolism Device Status: On  Anti-Embolism Device Location: BLE  Taken 07/12/2024 0800 by Cristopher Ozell SAUNDERS, RN  Anti-Embolism Device Type: SCD, Knee  Anti-Embolism Device Status: On  Anti-Embolism Device Location: BLE  Intervention: Prevent Infection  Recent Flowsheet Documentation  Taken 07/12/2024 0800 by Cristopher Ozell SAUNDERS, RN  Infection Prevention:   environmental surveillance performed   equipment surfaces disinfected   hand hygiene promoted   personal protective equipment utilized   rest/sleep promoted     Problem: Fall Injury Risk  Goal: Absence of Fall and Fall-Related Injury  Outcome: Shift Focus  Intervention: Promote Injury-Free Environment  Recent Flowsheet Documentation  Taken 07/12/2024 0800 by Cristopher Ozell SAUNDERS, RN  Safety Interventions:   aspiration precautions   bed alarm   bleeding precautions   enteral feeding safety   family at bedside   infection management     Problem: Self-Care Deficit  Goal: Improved Ability to Complete Activities of Daily Living  Outcome: Shift Focus     Problem: Wound  Goal: Optimal Coping  Outcome: Shift Focus  Goal: Optimal Pain Control and Function  Outcome: Shift  Focus   Shift Summary  Blood glucose levels increased throughout the shift, requiring correctional insulin  doses and provider notification in the afternoon.   Pressure reduction and skin protection interventions were consistently applied, with no new skin integrity issues documented.   Patient required maximum assistance for most ADLs and mobility, with a decline in bathing independence noted late in the shift.   Pain remained well controlled, with no discomfort reported and no pain interventions needed.   Overall, patient maintained safety and skin integrity, with ongoing support for ADLs and no reported falls or injuries during the shift.    Absence of Hospital-Acquired Illness or Injury: Aseptic technique was maintained and infection prevention measures were consistently performed; skin inspection revealed a clean, dry, intact incision with persistent bruising, and pressure reduction techniques and device skin pressure protection were applied throughout the shift. No new abnormalities were documented, and anti-embolism devices remained in use on bilateral lower extremities.    Absence of Fall and Fall-Related Injury: Fall risk interventions were maintained with bed alarms, hourly visual checks, toileting every two hours, and side rails up at all times; patient required maximum assistance for mobility and transfers, and nonskid footwear was used when out of bed. No falls or injuries were reported during the shift.    Improved Ability to Complete Activities of Daily Living: Patient remained independent with bathing until late in the shift when assistance was needed, and required maximum assistance for general hygiene and dressing; minimal assistance was needed for personal grooming and eating. Occupational therapy focused on self-care and home management training, with patient expressing a desire to regain independence in walking and daily activities.    Optimal Coping: Psychosocial status remained within defined limits throughout the shift, and patient was in agreement with therapy sessions. Caregiver support was present, though limited in lifting ability.    Optimal Pain Control and Function: Pain was consistently reported as zero throughout the shift, with patient denying pain and no pain interventions required; patient noted the change from usual activity but did not report discomfort.

## 2024-07-12 NOTE — Consults (Addendum)
 PHYSICAL THERAPY  Evaluation (07/12/24 0858)          Patient Name:  Frank Leach       Medical Record Number: [id number]   Date of Birth: 1950-11-13  Sex: Male        Post-Discharge Physical Therapy Recommendations:  PT Post Acute Discharge Recommendations: 5x weekly, High intensity   Discharge transport recommendations : Stretcher/ambulance  Equipment Recommendation  PT DME Recommendations: Defer to post acute          Treatment Diagnosis: Abnormalities of gait and mobility        ASSESSMENT  Problem List: Fall risk, Gait deviation, Decreased strength, Decreased mobility, Impaired balance      Assessment : Frank Leach is a 73 y.o. 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 He is now L crani for mass resection. PT to continue to follow patient to maximize functional mobility. Recommend PT 5x/week (high intensity) post-discharge to address mobility deficits.      Today's Interventions: PT evaluation and mobility assessment; educated patient and wife in the acute PT role/POC and the importance of staff assistance with all mobility to ensure safety; supine to and from sitting with facilitation for RUE/RLE management, trunk control and weight shifting; sitting balance EOB with facilitation for anterior trunk leaning and midline trunk control; patient tending to lean to the L in sitting; sitting to and from standing transfers at bedside with support underneath BUE at upper trunk, facilitation for RLE placement, anterior trunk leaning, bringing trunk control over base of support in stance, gait training forward/backard x10 feet, x4 sets with support underneath BUE at upper trunk, limited to room due to light precautions; facilitation for weight shifting and trunk control as patient tending to lean to the R side in standing; cues for RLE advancement and facilitation intermittently to move RLE forward and backward      Personal Factors/Comorbidities Present: 3+ factors   Examination of Body systems: 4+ elements  Clinical Presentation: Unstable    Eval Complexity : High Complexity     Activity Tolerance: Tolerated treatment well       PLAN  Planned Frequency of Treatment: Plan of Care Initiated: 07/12/24  1-2x per day Weekly Frequency: 3-4 days per week  Planned Treatment Duration: 07/26/24     Planned Interventions: Gait training, Home exercise program, Neuromuscular re-education, Education (Patient/Family/Caregiver), Self-care / Home Management training, Therapeutic Exercise, Therapeutic Activity (stairs training)     Goals:   Patient and Family Goals: regain independence     SHORT GOAL #1: patient will perform all functional transfer modified indep with LRAD               Time Frame : 2 weeks  SHORT GOAL #2: patient will ambulate x100 feet modified indep with LRAD              Time Frame : 2 weeks  SHORT GOAL #3: patient will negotiate x2 steps with railing indep              Time Frame : 2 weeks                       Long Term Goal #1: patient will ambulate x500 feet indep  Time Frame: 4 weeks  Prognosis:  Good  Positive Indicators: PLOF, motivation  Barriers to Discharge: Impaired Balance, Gait instability, Functional strength deficits, Inability to safely perform ADLS     SUBJECTIVE  Communication Preference: Verbal     Patient reports: patient in agreement with PT session  Pain Comments: patient denies pain        Prior Functional Status: independent at baseline without a device  Living Situation  Living Environment: House  Lives With: Spouse (wife works several hours a day)  Home Living: One level home, Tub/shower unit, Hand-held shower hose, Handicapped height toilet, Grab bars in shower, Grab bars around toilet, Stairs to enter with rails, Shower chair without back  Rail placement (outside): Bilateral rails in reach  Number of Stairs to Enter (outside): 2      Equipment available at home: Rolling walker, Straight cane (shower chair without back)          Objective Findings  Precautions / Restrictions  Precautions: Falls precautions, HOB elevated  Required Braces or Orthoses: Non-applicable  Precautions / Restrictions comments: Light precautions until 10:28 AM 12/7     Medical Tests / Procedures: reviewed in EPIC  Equipment / Environment: Vascular access (PIV, TLC, Port-a-cath, PICC), Telemetry, Arterial line, Foley     Vitals/Orthostatics : VSS per monitor; asymptomatic     Cognition: WFL  Orientation: Oriented x4           Skin Inspection: Incision C/D/I     Upper Extremities  UE comment: LUE WFL; RUE weakness with flexion tone noted    Lower Extremities  LE comment: LLE WFL; RLE grossly 3-/5               Static Sitting-Level of Assistance: Minimum assistance  Dynamic Sitting-Level of Assistance: Minimum assistance    Static Standing-Level of Assistance: Minimum assistance  Dynamic Standing - Level of Assistance: Minimum assistance, Moderate assistance      Bed Mobility: Rolling Right, Rolling Left, Supine to Sit, Sit to Supine  Rolling right assistance level: Minimal assist, patient does 75% or more  Rolling left assistance level: Minimal assist, patient does 75% or more  Supine to Sit assistance level: Moderate assist, patient does 50-74%  Sit to Supine assistance level: Moderate assist, patient does 50-74%     Transfers: Sit to Stand, Bed to Chair  Sit to Stand assistance level: Minimal assist, patient does 75% or more  Bed to Chair assistance level: Minimal assist, patient does 75% or more      Ambulation  Level of Assistance: Moderate assist, patient does 50-74%  Assistive Device:  (support underneath BUE at upper trunk)  Distance Ambulated (ft):  (10 feet, x4 sets)              Patient at end of session: In bed, Friends/Family present, Alarm activated, Lines intact, Notified Nurse (RN Darrel) aware of patient's status)            AM-PAC-6 click  Help currently need turning over In bed?: A Little - Minimal/Contact Guard Assist/Supervision  Help currently needed sitting down/standing up from chair with arms? : A Little - Minimal/Contact Guard Assist/Supervision  Help currently needed moving from supine to sitting on edge of bed?: A lot - Maximum/Moderate Assistance  Help currently needed moving to and from bed from wheelchair?: A Little - Minimal/Contact Guard Assist/Supervision  Help currently needed walking in a hospital room?: A lot - Maximum/Moderate Assistance  Help currently needed climbing 3-5 steps with railing?: A lot - Maximum/Moderate Assistance  Basic Mobility Score 6 click: 15    6 click Score (in points): % of Functional Impairment, Limitation, Restriction  6: 100% impaired, limited, restricted  7-8: At least 80%, but less than 100% impaired, limited restricted  9-13: At least 60%, but less than 80% impaired, limited restricted  14-19: At least 40%, but less than 60% impaired, limited restricted  20-22: At least 20%, but less than 40% impaired, limited restricted  23: At least 1%, but less than 20% impaired, limited restricted  24: 0% impaired, limited restricted    'AM-PAC' forms are Copyright protected by [employer]     Physical Therapy Session Duration  PT Individual [mins]: 45    I attest that I have reviewed the above information.  Signed: Delon LOISE Georgi, PT  Filed 07/12/2024

## 2024-07-12 NOTE — Progress Notes (Signed)
 [facility]   INPATIENT PROGRESS NOTE      Brief History of Present Illness  Frank Leach is a 73 y.o. male with RCC/renal transplant now found to have a left frontal brain mass now s/p left craniotomy for resection (12/5).    Subjective/Interval History  R sided post op strength improving. NAEON. On nicard 2.5    Interval Imaging Reviewed  MRI brain wwo post op personally reviewed demonstrates possible residual tumor superiorly, no strokes    Neurological Assessment and Plan  **Frontal mass of brain  POD1 Left craniotomy for resection of brain mass (12/5)  - SBP < 160; Na > 135  - dex6q6 taper  - Keppra  500 BID x 7 days  - ok to continue tacrolimus   - HOB > 30  - PT OT    Anticoagulant/Antiplatelet needs: ASA- hold    Disposition: floor     ___________________________________________________________________    Neurological Exam  EOSp  AOx3  PERRL  EOMI  R FD  TML  RUE 4  LUE 5  RLE 4+ proximally, 4 distally  LLE 5    Dressing c/d/i    The patient's vitals, intake/output, labs, orders, and relevant imaging were reviewed for the last 24 hours.    Problem List  Principal Problem:    Frontal mass of brain

## 2024-07-13 LAB — SLIDE REVIEW

## 2024-07-13 LAB — CBC W/ AUTO DIFF
BASOPHILS ABSOLUTE COUNT: 0 10*9/L (ref 0.0–0.1)
BASOPHILS RELATIVE PERCENT: 0 %
EOSINOPHILS ABSOLUTE COUNT: 0.1 10*9/L (ref 0.0–0.5)
EOSINOPHILS RELATIVE PERCENT: 0.8 %
HEMATOCRIT: 36.9 % — ABNORMAL LOW (ref 39.0–48.0)
HEMOGLOBIN: 11.9 g/dL — ABNORMAL LOW (ref 12.9–16.5)
LYMPHOCYTES ABSOLUTE COUNT: 0.3 10*9/L — ABNORMAL LOW (ref 1.1–3.6)
LYMPHOCYTES RELATIVE PERCENT: 1.5 %
MEAN CORPUSCULAR HEMOGLOBIN CONC: 32.3 g/dL (ref 32.0–36.0)
MEAN CORPUSCULAR HEMOGLOBIN: 28.8 pg (ref 25.9–32.4)
MEAN CORPUSCULAR VOLUME: 89.2 fL (ref 77.6–95.7)
MEAN PLATELET VOLUME: 9.5 fL (ref 6.8–10.7)
MONOCYTES ABSOLUTE COUNT: 1.2 10*9/L — ABNORMAL HIGH (ref 0.3–0.8)
MONOCYTES RELATIVE PERCENT: 6.3 %
NEUTROPHILS ABSOLUTE COUNT: 17.4 10*9/L — ABNORMAL HIGH (ref 1.8–7.8)
NEUTROPHILS RELATIVE PERCENT: 91.4 %
PLATELET COUNT: 212 10*9/L (ref 150–450)
RED BLOOD CELL COUNT: 4.14 10*12/L — ABNORMAL LOW (ref 4.26–5.60)
RED CELL DISTRIBUTION WIDTH: 15.2 % (ref 12.2–15.2)
WBC ADJUSTED: 19 10*9/L — ABNORMAL HIGH (ref 3.6–11.2)

## 2024-07-13 LAB — RENAL FUNCTION PANEL
ALBUMIN: 3.3 g/dL — ABNORMAL LOW (ref 3.4–5.0)
ANION GAP: 13 mmol/L (ref 5–14)
BLOOD UREA NITROGEN: 33 mg/dL — ABNORMAL HIGH (ref 9–23)
BUN / CREAT RATIO: 21
CALCIUM: 10.3 mg/dL (ref 8.7–10.4)
CHLORIDE: 111 mmol/L — ABNORMAL HIGH (ref 98–107)
CO2: 22 mmol/L (ref 20.0–31.0)
CREATININE: 1.57 mg/dL — ABNORMAL HIGH (ref 0.73–1.18)
EGFR CKD-EPI (2021) MALE: 47 mL/min/1.73m2 — ABNORMAL LOW (ref >=60)
GLUCOSE RANDOM: 92 mg/dL (ref 70–179)
PHOSPHORUS: 3.4 mg/dL (ref 2.4–5.1)
POTASSIUM: 5 mmol/L — ABNORMAL HIGH (ref 3.4–4.8)
SODIUM: 146 mmol/L — ABNORMAL HIGH (ref 135–145)

## 2024-07-13 LAB — POTASSIUM: POTASSIUM: 5.4 mmol/L — ABNORMAL HIGH (ref 3.5–5.1)

## 2024-07-13 LAB — MAGNESIUM: MAGNESIUM: 2.1 mg/dL (ref 1.6–2.6)

## 2024-07-13 LAB — TACROLIMUS LEVEL, TROUGH: TACROLIMUS, TROUGH: 1.5 ng/mL — ABNORMAL LOW (ref 5.0–15.0)

## 2024-07-13 MED ADMIN — insulin NPH (HumuLIN,NovoLIN) injection 10 Units: 10 [IU] | SUBCUTANEOUS | @ 13:00:00

## 2024-07-13 MED ADMIN — senna (SENOKOT) tablet 2 tablet: 2 | ORAL | @ 02:00:00

## 2024-07-13 MED ADMIN — dexAMETHasone (DECADRON) tablet 4 mg: 4 mg | ORAL | @ 23:00:00 | Stop: 2024-07-15

## 2024-07-13 MED ADMIN — dexAMETHasone (DECADRON) tablet 4 mg: 4 mg | ORAL | @ 18:00:00 | Stop: 2024-07-15

## 2024-07-13 MED ADMIN — tacrolimus (ENVARSUS XR) extended release tablet 5 mg: 5 mg | ORAL | @ 13:00:00

## 2024-07-13 MED ADMIN — levETIRAcetam (KEPPRA) tablet 500 mg: 500 mg | ORAL | @ 13:00:00 | Stop: 2024-07-18

## 2024-07-13 MED ADMIN — levETIRAcetam (KEPPRA) tablet 500 mg: 500 mg | ORAL | @ 02:00:00 | Stop: 2024-07-18

## 2024-07-13 MED ADMIN — famotidine (PEPCID) tablet 20 mg: 20 mg | ORAL | @ 13:00:00

## 2024-07-13 MED ADMIN — famotidine (PEPCID) tablet 20 mg: 20 mg | ORAL | @ 02:00:00

## 2024-07-13 MED ADMIN — dexAMETHasone (DECADRON) tablet 6 mg: 6 mg | ORAL | @ 05:00:00 | Stop: 2024-07-15

## 2024-07-13 MED ADMIN — dexAMETHasone (DECADRON) tablet 6 mg: 6 mg | ORAL | @ 10:00:00 | Stop: 2024-07-13

## 2024-07-13 MED ADMIN — atorvastatin (LIPITOR) tablet 40 mg: 40 mg | ORAL | @ 13:00:00

## 2024-07-13 MED ADMIN — valsartan (DIOVAN) tablet 160 mg: 160 mg | ORAL | @ 13:00:00

## 2024-07-13 MED ADMIN — insulin regular (HumuLIN,NovoLIN) inj % MEAL EATEN 5 Units: 5 [IU] | SUBCUTANEOUS | @ 02:00:00

## 2024-07-13 MED ADMIN — insulin regular (HumuLIN,NovoLIN) inj % MEAL EATEN 5 Units: 5 [IU] | SUBCUTANEOUS | @ 19:00:00

## 2024-07-13 MED ADMIN — melatonin tablet 3 mg: 3 mg | ORAL | @ 07:00:00

## 2024-07-13 MED ADMIN — insulin regular (HumuLIN,NovoLIN) inj CORRECTIONAL 0-9 Units: 0-9 [IU] | SUBCUTANEOUS | @ 02:00:00

## 2024-07-13 MED ADMIN — insulin regular (HumuLIN,NovoLIN) inj CORRECTIONAL 0-9 Units: 0-9 [IU] | SUBCUTANEOUS | @ 23:00:00

## 2024-07-13 NOTE — Progress Notes (Signed)
 [facility]   INPATIENT PROGRESS NOTE      Brief History of Present Illness  Frank Leach is a 73 y.o. male with RCC/renal transplant now found to have a left frontal brain mass now s/p left craniotomy for resection (12/5).    Subjective/Interval History  HR 47-63, otherwise VSS and NAEO  Glucose 135-321, on dex taper    Interval Imaging Reviewed  None    Neurological Assessment and Plan  **Frontal mass of brain  POD2 Left craniotomy for resection of brain mass (12/5)  - SBP < 160; Na > 135  - dex taper to 2mg  BID  - Keppra  500 BID x 7 days  - light precautions x48 hours  - ok to continue tacrolimus   - HOB > 30  - PT/OT  - PM&R consult for AIR eval    Anticoagulant/Antiplatelet needs: ASA- hold    Disposition: floor status    ___________________________________________________________________    Neurological Exam  EOSp  Ox3  PERRL  EOMI  FS  TML  RUE 4  LUE 5  RLE 4+  LLE 5    Incision c/d/I staples with some old blood, no active bleeding    The patient's vitals, intake/output, labs, orders, and relevant imaging were reviewed for the last 24 hours.    Problem List  Principal Problem:    Frontal mass of brain

## 2024-07-13 NOTE — Progress Notes (Signed)
 Neurocritical Care Daily Progress Note     CODE STATUS:    Code Status: Full Code    HCDM (patient stated preference): Lambert,Shirley - Spouse - [phone]    Date of service: 07/13/2024  Hospital Day:  LOS: 2 days        HPI     Frank Leach is a 73 y.o. 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 He is now POD 0 L crani for mass resection     Assessment and Plan     Frank Leach is a 73 y.o. male admitted to NSICU with a diagnosis of brain mass.     Interval Events:   NPH increased    Neuro:  Mobility goal: 2 (HOB elevation)    **Brain tumor, s/p L crani 12/5  - post-op MRI w w/o 11/5 (personally reviewed) with expected post-op changes and potentially some residual contrast enhancement  - keppra  500mg  BID for post-op seizure prophylaxis x 7 days  - decadron  taper per SRN  - q4h neuro checks  - obtain PT/OT consults    **Pain  - tylenol  prn mild pain  - oxycodone  prn moderate pain  - fentanyl  prn severe pain    **Family communication   - Family updated by [facility] post-op    Cardiovascular:  Admission weight: 72.6  Daily Weight: 73 kg (160 lb 15 oz)     Hemodynamic goals:  - MAP > 65  - SBP < 160    **Hypertensive emergency  - nicardipine  off  - home meds- valsartan  160, amlo 10 (outpatient was hypotensive- unsure if he was taking)  - ECG with sinus brady and known 1st degree block  - Resume valsartan , continue to hold amlodipine  given known AV black    **Hyperlipidemia  - continue atorva 40mg      Pulmonary    - Stable on RA  - Titrate O2 for sat > 92%      FEN  Body mass index is 25.2 kg/m??.    GI prophylaxis: H2 blocker (steroids)  Last BM Date:  (pta)  Foley catheter: No foley in place.      **Bladder mass  - CT abd/pelvis showed bilobed mass or adjacent synchronous masses in right anterior bladder wall c/f malignant urothelial neoplasm.   - urology was consulted last admission and recommended outpatient cystoscopy and tissue diagnosis including potential TURT    **H/O renal cell carcinoma s/p bilateral nephrectomy and kidney transplant 2020  **ESRD- previously requiring HD  - baseline creatinine 1.5-1.9 per chart review  - dose-adjust all medications for renal function  - avoid nephrotoxic meds; minimize contrast exposure  - strict I/Os; daily weight  - continue Tacrolimus  (envarsus ) 5mg  daily - managed by transplant pharmacy  - Transplant consulted - holding mycophenalate in setting of new malignancy  - goal net even, encourage PO intake    **Nutrition  - advance diet as tolerated    **Nausea  - zofran  prn    **Type 2 diabetes mellitus/steroid induced hyperglycemia  - A1c 6.8  - POCT ACHS, SSI    **Fluids/electrolytes  - obtain post-op chemistry and supplement electrolytes as needed    **Hypomagnesemia  - replete and recheck    Recent Labs     07/12/24  0409 07/13/24  0504  BUN 24* 33*   CREATININE 1.47* 1.57*        Heme/ID  Current DVT prophylaxis: SCDs, Lovenox 40 mg daily    - Leukocytosis ISO steroids and procedure, afebrile  - s/p vanc/Cefazolin  x1 in OR      Current Access:         -  PIV x 2       - Arterial line: No arterial line present.       - Central venous line: No central line present.    Tubes and drains:  Patient Lines/Drains/Airways Status       Active Active Lines, Drains, & Airways       Name Placement date Placement time Site Days    Peripheral IV 07/11/24 Anterior;Right Forearm 07/11/24  1035  Forearm  1    Peripheral IV 07/11/24 Left Hand 07/11/24  1120  Hand  1                           Objective Data     All vital signs and resulted laboratory studies for the past 24 hours have been reviewed.  All ordered medications have been reviewed.    Temp:  [36.3 ??C (97.3 ??F)-36.9 ??C (98.5 ??F)] 36.3 ??C (97.3 ??F)  Pulse:  [47-63] 61  SpO2 Pulse:  [48-63] 61  Resp:  [15-24] 18  BP: (100-159)/(54-73) 156/68  MAP (mmHg):  [68-99] 99  A BP-2: (114-136)/(45-57) 132/52  MAP:  [67 mmHg-82 mmHg] 75 mmHg  SpO2:  [94 %-100 %] 99 %    Intake/Output Summary (Last 24 hours) at 07/13/2024 9297  Last data filed at 07/13/2024 0600  Gross per 24 hour   Intake 750 ml   Output 1475 ml   Net -725 ml        Physical Exam     General Exam:  General: Awake and alert.   Appears comfortable. No obvious distress.   ENT:  Mucous membranes moist. Oropharynx clear.  Cardiovascular:  Regular rate and rhythm.  2+ radial pulses bilaterally.    Respiratory: Breathing is comfortable and unlabored.   Gastrointestinal: Soft, nontender, nondistended.  Extremities: Warm and well-perfused. No cyanosis, clubbing, or edema.  Skin: No obvious rashes or ecchymoses.    Neurological Exam:  Mental Status  LOC: awake and alert  Orientation: person, place and time  Orientation: stuttering, pt stated baseline  Neglect: not present  Speech: normal  Language: fluent with intact naming, repetition, comprehension    Cranial Nerves  Left pupil brisk > Right pupil and sluggish- present on admission  Gaze: normal  Face Motor: mild right facial droop    Motor:   RUE: commands and 4/5  LUE: commands and 4+/5  RLE: commands and 4+/5  LLE: commands and 4+/5     Sensory:    Sensory is intact to light touch    Glasgow Coma Score  Motor: obeys commands = 6  Verbal: oriented = 5  Eyes: eyes open spontaneously = 4    DISPOSITION      The patient is stable to transfer to an acute care bed.    Estimated Transfer Date:  12/6    BILLING     This is a subsequent evaluation of the patient.      Iris Sergeant, MD  Neurocritical Care

## 2024-07-13 NOTE — Plan of Care (Signed)
 Q4 neuro checks. Pt worked on ROM in his RUE and improving strength during my shift. Pt was given PRN melatonin to help with sleep. All alarms active and audible. See flowsheets for details.      Problem: Adult Inpatient Plan of Care  Goal: Optimal Comfort and Wellbeing  Outcome: Shift Focus     Problem: Wound  Goal: Optimal Coping  Outcome: Shift Focus  Goal: Optimal Pain Control and Function  Outcome: Shift Focus  Goal: Optimal Wound Healing  Outcome: Shift Focus     Problem: Adult Inpatient Plan of Care  Goal: Absence of Hospital-Acquired Illness or Injury  Intervention: Identify and Manage Fall Risk  Recent Flowsheet Documentation  Taken 07/12/2024 2000 by Dow Schuyler RODES, RN  Safety Interventions:   aspiration precautions   environmental modification   fall reduction program maintained   low bed   lighting adjusted for tasks/safety   nonskid shoes/slippers when out of bed  Intervention: Prevent Skin Injury  Recent Flowsheet Documentation  Taken 07/13/2024 0400 by Dow Schuyler RODES, RN  Positioning for Skin: (pt readjusted self in bed) Supine/Back  Taken 07/13/2024 0200 by Dow Schuyler RODES, RN  Positioning for Skin: (pt declined repositioning) Supine/Back  Taken 07/13/2024 0000 by Dow Schuyler RODES, RN  Positioning for Skin: Supine/Back  Taken 07/12/2024 2200 by Dow Schuyler RODES, RN  Positioning for Skin: (pt declined repositioning) Left  Taken 07/12/2024 2000 by Dow Schuyler RODES, RN  Positioning for Skin: Left  Device Skin Pressure Protection: absorbent pad utilized/changed  Skin Protection: adhesive use limited  Intervention: Prevent and Manage VTE (Venous Thromboembolism) Risk  Recent Flowsheet Documentation  Taken 07/13/2024 0400 by Dow Schuyler RODES, RN  Anti-Embolism Device Type: SCD, Knee  Anti-Embolism Device Status: On  Anti-Embolism Device Location: BLE  Taken 07/13/2024 0200 by Dow Schuyler RODES, RN  Anti-Embolism Device Type: SCD, Knee  Anti-Embolism Device Status: On  Anti-Embolism Device Location: BLE  Taken 07/13/2024 0000 by Dow Schuyler RODES, RN  Anti-Embolism Device Type: SCD, Knee  Anti-Embolism Device Status: On  Anti-Embolism Device Location: BLE  Taken 07/12/2024 2200 by Dow Schuyler RODES, RN  Anti-Embolism Device Type: SCD, Knee  Anti-Embolism Device Status: On  Anti-Embolism Device Location: BLE  Taken 07/12/2024 2000 by Dow Schuyler RODES, RN  Anti-Embolism Device Type: SCD, Knee  Anti-Embolism Device Status: On  Anti-Embolism Device Location: BLE  Intervention: Prevent Infection  Recent Flowsheet Documentation  Taken 07/12/2024 2000 by Dow Schuyler RODES, RN  Infection Prevention: environmental surveillance performed     Problem: Fall Injury Risk  Goal: Absence of Fall and Fall-Related Injury  Intervention: Promote Injury-Free Environment  Recent Flowsheet Documentation  Taken 07/12/2024 2000 by Dow Schuyler RODES, RN  Safety Interventions:   aspiration precautions   environmental modification   fall reduction program maintained   low bed   lighting adjusted for tasks/safety   nonskid shoes/slippers when out of bed     Problem: Skin Injury Risk Increased  Goal: Skin Health and Integrity  Intervention: Optimize Skin Protection  Recent Flowsheet Documentation  Taken 07/13/2024 0400 by Dow Schuyler RODES, RN  Activity Management: in bed  Head of Bed [facility]) Positioning: HOB at 30-45 degrees  Taken 07/13/2024 0200 by Dow Schuyler RODES, RN  Activity Management: in bed  Taken 07/13/2024 0000 by Dow Schuyler RODES, RN  Activity Management: in bed  Head of Bed [facility]) Positioning: HOB at 45 degrees  Taken 07/12/2024 2200 by Dow Schuyler RODES, RN  Activity Management: in bed  Head of Bed (  HOB) Positioning: HOB at 45 degrees  Taken 07/12/2024 2000 by Dow Schuyler RODES, RN  Activity Management: in bed  Pressure Reduction Techniques: frequent weight shift encouraged  Head of Bed (HOB) Positioning: HOB at 45 degrees  Pressure Reduction Devices: pressure-redistributing mattress utilized  Skin Protection: adhesive use limited Problem: Wound  Goal: Optimal Functional Ability  Intervention: Optimize Functional Ability  Recent Flowsheet Documentation  Taken 07/13/2024 0400 by Dow Schuyler RODES, RN  Activity Management: in bed  Taken 07/13/2024 0200 by Dow Schuyler RODES, RN  Activity Management: in bed  Taken 07/13/2024 0000 by Dow Schuyler RODES, RN  Activity Management: in bed  Taken 07/12/2024 2200 by Dow Schuyler RODES, RN  Activity Management: in bed  Taken 07/12/2024 2000 by Dow Schuyler RODES, RN  Activity Management: in bed  Goal: Absence of Infection Signs and Symptoms  Intervention: Prevent or Manage Infection  Recent Flowsheet Documentation  Taken 07/12/2024 2000 by Dow Schuyler RODES, RN  Infection Management: aseptic technique maintained  Goal: Skin Health and Integrity  Intervention: Optimize Skin Protection  Recent Flowsheet Documentation  Taken 07/13/2024 0400 by Dow Schuyler RODES, RN  Activity Management: in bed  Head of Bed [facility]) Positioning: HOB at 30-45 degrees  Taken 07/13/2024 0200 by Dow Schuyler RODES, RN  Activity Management: in bed  Taken 07/13/2024 0000 by Dow Schuyler RODES, RN  Activity Management: in bed  Head of Bed [facility]) Positioning: HOB at 45 degrees  Taken 07/12/2024 2200 by Dow Schuyler RODES, RN  Activity Management: in bed  Head of Bed [facility]) Positioning: HOB at 45 degrees  Taken 07/12/2024 2000 by Dow Schuyler RODES, RN  Activity Management: in bed  Pressure Reduction Techniques: frequent weight shift encouraged  Head of Bed (HOB) Positioning: HOB at 45 degrees  Pressure Reduction Devices: pressure-redistributing mattress utilized  Skin Protection: adhesive use limited

## 2024-07-13 NOTE — Plan of Care (Addendum)
 Shift Summary  dexamethasone  was administered during the shift.   Neurological and musculoskeletal assessments revealed limited movement and some motor strength deficits, especially on the right side.   Surgical site remained clean, dry, and intact with no odor or dressing required.   Tolerated diet.  Vital signs remained stable.   Overall, comfort and wellbeing were maintained, and no falls or injuries occurred during the shift.     Optimal Comfort and Wellbeing: Pain and discomfort were not documented during the shift; dexamethasone  was administered, and glucose was monitored with a result of 148 mg/dL. Speech remained clear, and cognition was intact throughout the shift.     Readiness for Transition of Care: IRF Candidacy Score remained stable at 85 throughout the shift, and unplanned readmission scores showed minimal change.     Absence of Fall and Fall-Related Injury: Mobility and activity remained very limited, with bedfast status and use of positioning devices; no falls or injuries were documented.     Improved Ability to Complete Activities of Daily Living: Motor responses and sensation were present in all extremities, but movement was limited and some motor strength deficits were noted, especially on the right side.     Optimal Wound Healing: Surgical site was open to air with sutures, clean, dry, intact peri-wound, attached margins, and no odor noted.

## 2024-07-14 LAB — BASIC METABOLIC PANEL
ANION GAP: 9 mmol/L (ref 5–14)
BLOOD UREA NITROGEN: 30 mg/dL — ABNORMAL HIGH (ref 9–23)
BUN / CREAT RATIO: 17
CALCIUM: 9.8 mg/dL (ref 8.7–10.4)
CHLORIDE: 108 mmol/L — ABNORMAL HIGH (ref 98–107)
CO2: 25 mmol/L (ref 20.0–31.0)
CREATININE: 1.75 mg/dL — ABNORMAL HIGH (ref 0.73–1.18)
EGFR CKD-EPI (2021) MALE: 41 mL/min/1.73m2 — ABNORMAL LOW (ref >=60)
GLUCOSE RANDOM: 242 mg/dL — ABNORMAL HIGH (ref 70–179)
POTASSIUM: 5 mmol/L — ABNORMAL HIGH (ref 3.4–4.8)
SODIUM: 142 mmol/L (ref 135–145)

## 2024-07-14 LAB — PHOSPHORUS: PHOSPHORUS: 3.7 mg/dL (ref 2.4–5.1)

## 2024-07-14 LAB — TACROLIMUS LEVEL, TROUGH: TACROLIMUS, TROUGH: 1.7 ng/mL — ABNORMAL LOW (ref 5.0–15.0)

## 2024-07-14 LAB — MAGNESIUM: MAGNESIUM: 2 mg/dL (ref 1.6–2.6)

## 2024-07-14 MED ADMIN — heparin (porcine) 5,000 unit/mL injection 5,000 Units: 5000 [IU] | SUBCUTANEOUS | @ 19:00:00

## 2024-07-14 MED ADMIN — heparin (porcine) 5,000 unit/mL injection 5,000 Units: 5000 [IU] | SUBCUTANEOUS | @ 03:00:00

## 2024-07-14 MED ADMIN — heparin (porcine) 5,000 unit/mL injection 5,000 Units: 5000 [IU] | SUBCUTANEOUS | @ 11:00:00

## 2024-07-14 MED ADMIN — insulin NPH (HumuLIN,NovoLIN) injection 10 Units: 10 [IU] | SUBCUTANEOUS | @ 03:00:00

## 2024-07-14 MED ADMIN — senna (SENOKOT) tablet 2 tablet: 2 | ORAL | @ 02:00:00

## 2024-07-14 MED ADMIN — insulin lispro (HumaLOG) injection CORRECTIONAL 0-20 Units: 0-20 [IU] | SUBCUTANEOUS | @ 22:00:00

## 2024-07-14 MED ADMIN — insulin lispro (HumaLOG) injection CORRECTIONAL 0-20 Units: 0-20 [IU] | SUBCUTANEOUS | @ 17:00:00

## 2024-07-14 MED ADMIN — dexAMETHasone (DECADRON) tablet 4 mg: 4 mg | ORAL | @ 17:00:00 | Stop: 2024-07-15

## 2024-07-14 MED ADMIN — dexAMETHasone (DECADRON) tablet 4 mg: 4 mg | ORAL | @ 23:00:00 | Stop: 2024-07-15

## 2024-07-14 MED ADMIN — dexAMETHasone (DECADRON) tablet 4 mg: 4 mg | ORAL | @ 11:00:00 | Stop: 2024-07-15

## 2024-07-14 MED ADMIN — dexAMETHasone (DECADRON) tablet 4 mg: 4 mg | ORAL | @ 05:00:00 | Stop: 2024-07-15

## 2024-07-14 MED ADMIN — tacrolimus (ENVARSUS XR) extended release tablet 5 mg: 5 mg | ORAL | @ 14:00:00

## 2024-07-14 MED ADMIN — levETIRAcetam (KEPPRA) tablet 500 mg: 500 mg | ORAL | @ 02:00:00 | Stop: 2024-07-18

## 2024-07-14 MED ADMIN — levETIRAcetam (KEPPRA) tablet 500 mg: 500 mg | ORAL | @ 14:00:00 | Stop: 2024-07-18

## 2024-07-14 MED ADMIN — famotidine (PEPCID) tablet 20 mg: 20 mg | ORAL | @ 14:00:00

## 2024-07-14 MED ADMIN — famotidine (PEPCID) tablet 20 mg: 20 mg | ORAL | @ 02:00:00

## 2024-07-14 MED ADMIN — atorvastatin (LIPITOR) tablet 40 mg: 40 mg | ORAL | @ 14:00:00

## 2024-07-14 MED ADMIN — insulin lispro (HumaLOG) inj PERCENTAGE MEAL EATEN 6 Units: 6 [IU] | SUBCUTANEOUS | @ 14:00:00

## 2024-07-14 MED ADMIN — insulin lispro (HumaLOG) inj PERCENTAGE MEAL EATEN 6 Units: 6 [IU] | SUBCUTANEOUS | @ 19:00:00

## 2024-07-14 MED ADMIN — insulin lispro (HumaLOG) inj PERCENTAGE MEAL EATEN 6 Units: 6 [IU] | SUBCUTANEOUS | @ 23:00:00

## 2024-07-14 MED ADMIN — insulin NPH (HumuLIN,NovoLIN) injection 7 Units: 7 [IU] | SUBCUTANEOUS | @ 14:00:00

## 2024-07-14 MED ADMIN — valsartan (DIOVAN) tablet 160 mg: 160 mg | ORAL | @ 14:00:00

## 2024-07-14 MED ADMIN — insulin regular (HumuLIN,NovoLIN) inj % MEAL EATEN 5 Units: 5 [IU] | SUBCUTANEOUS | @ 01:00:00

## 2024-07-14 MED ADMIN — insulin regular (HumuLIN,NovoLIN) inj CORRECTIONAL 0-9 Units: 0-9 [IU] | SUBCUTANEOUS | @ 02:00:00

## 2024-07-14 NOTE — Consults (Signed)
 Endocrine Team Diabetes New Consult Note     Consult information:  Requesting Attending Physician : Rosaria CHRISTELLA Lush, MD  Service Requesting Consult : Neurosurgery [facility])  Primary Care Provider: Kandis Stefano [place], MD  Impression:  Frank Leach is a 73 y.o. male admitted for brain lesion. We have been consulted at the request of Rosaria CHRISTELLA Lush, MD to evaluate Frank Leach for hyperglycemia.     Medical Decision Making:  Diagnoses:  1.Type 2 Diabetes. Uncontrolled With severe hyperglycemia last 24 hours.  2. Nutrition: Complicating glycemic control. Increasing risk for both hypoglycemia and hyperglycemia.  3. Steroids. Complicating glycemic control and increasing risk for hyperglycemia.  4. Transplant. Complicating glycemic control and increasing risk for hyperglycemia.  5. Chronic Kidney Disease. Complicating glycemic control and increasing risk for hypoglycemia.        Studies reviewed 07/14/24:  Labs: CBC, BMP, POCT-BG, and HbA1C  Interpretation: +Leukocytosis. Hyperkalemia. Elevated Cr in the setting of known CKD. Hyperglycemia with severe. A1C 6.8% indicates excellent control outpatient.  Notes reviewed: Primary team and nursing notes      Overall impression based on above reviews and history:  Hyperglycemia in patient with known type 2 diabetes complicated by steroids Will decrease basal, revise nutritional and correctional insulin  from regular to lispro since eating and increase nutritional dose slightly.       Steroid taper:  12/7: dexamethasone  4mg  q6h  12/9: dexamethasone  2mg  q6h  12/12: dexamethasone  2mg  q8h  12/14: dexamethasone  2mg  q12h  12/16: dexamethasone  1mg  q12h    Recommendations:  - NPH 7u q12h  - lispro 6u qAC  - lispro target 140, ISF 20 achs  - Hypoglycemia protocol.  - POCT-BG achs.  - Ensure patient is on glucose precautions if patient taking nutrition by mouth.     Discharge planning:  In process. Will complete actual plan closer to discharge.   Anticipate discharging back on home regimen. May depend on steroids.     Thank you for this consult. Discussed plan with primary team. We will continue to follow and make recommendations and place orders as appropriate.    Please page with questions or concerns: Affan Callow, NP: [phone]  DCT on call from 6AM - 3PM on weekdays then endocrine fellow on call: [phone] from 3PM - 6AM on weekdays and on weekends and holidays.   If APP cannot be reached, please page the endocrine fellow on call.      Subjective:  Initial HPI:  Frank Leach is a 73 y.o. 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     Diabetes History:  Patient has a history of Type 2 diabetes diagnosed at least 15+ years ago.  Diabetes is managed by: PCP, nephrologist.  Current home diabetes regimen: metformin  XR 500mg  qAM,empagliflozin  25mg  qam.  Current home blood glucose monitoring:  occasionally.  Hypoglycemia awareness: yes.  Complications related to diabetes: ESRD s/p transplant in 2020      Current Nutrition:  Active Orders   Diet    Nutrition Therapy Regular/House       ROS: As per HPI.    Scheduled Medications[1]    Current Outpatient Medications   Medication Instructions    acetaminophen  (TYLENOL ) 500-1,000 mg, Oral, Every 6 hours PRN    allopurinol  (ZYLOPRIM ) 100 mg, Oral, Every evening    [  Paused] amlodipine  (NORVASC ) 10 mg, Oral, Daily (standard)    atorvastatin  (LIPITOR ) 40 mg, Oral, Daily (standard)    dexAMETHasone  (DECADRON ) 1 MG tablet Take 4 tablets (4 mg total) by mouth every twelve (12) hours for 3 days, THEN 4 tablets (4 mg total) daily for 3 days, THEN 2 tablets (2 mg total) daily for 3 days, THEN 1 tablet (1 mg total) daily for 3 days.    ENVARSUS  XR 4 mg, Oral, Daily (standard), Take in addition to ONE 1 mg tablets for a total daily dose of 5 mg.    latanoprost (XALATAN) 0.005 % ophthalmic solution 1 drop, Nightly    magnesium  oxide-Mg AA chelate (MAGNESIUM , AMINO ACID CHELATE,) 133 mg 1 tablet, Oral, 2 times a day    metFORMIN  (GLUCOPHAGE -XR) 500 mg, Oral, Daily (RT)    [Paused] mycophenolate  (MYFORTIC ) 360 mg, Oral, 2 times a day (standard)    [Paused] olmesartan  (BENICAR ) 40 mg, Oral, Daily (standard)    [START ON 07/20/2024] predniSONE  (DELTASONE ) 5 mg, Oral, Daily (standard)    tacrolimus  (ENVARSUS  XR) 1 mg Tb24 extended release tablet 1 tablet, Daily           Past Medical History[2]    Past Surgical History[3]    Family History[4]    Short Social History[5]    OBJECTIVE:  BP 133/71  - Pulse (!) 47  - Temp 36.5 ??C (97.7 ??F) (Temporal)  - Resp 15  - Ht 170.2 cm (5' 7.01)  - Wt 73 kg (160 lb 15 oz)  - SpO2 97%  - BMI 25.20 kg/m??   Wt Readings from Last 12 Encounters:   07/13/24 73 kg (160 lb 15 oz)   07/03/24 72.6 kg (160 lb)   01/18/24 73.2 kg (161 lb 6.4 oz)   06/08/23 68.9 kg (151 lb 12.8 oz)   03/22/23 68.3 kg (150 lb 8 oz)   02/14/23 68.4 kg (150 lb 12.8 oz)   12/21/22 68 kg (149 lb 14.4 oz)   11/27/22 67.6 kg (149 lb)   11/02/22 68.1 kg (150 lb 3.2 oz)   10/13/22 68.2 kg (150 lb 6.4 oz)   06/02/22 67 kg (147 lb 9.6 oz)   02/08/22 67.4 kg (148 lb 9.6 oz)     Physical Exam  Vitals and nursing note reviewed.   Constitutional:       General: He is not in acute distress.     Appearance: Normal appearance.   Pulmonary:      Effort: Pulmonary effort is normal. No respiratory distress.   Skin:     General: Skin is warm and dry.   Neurological:      General: No focal deficit present.      Mental Status: He is alert and oriented to person, place, and time.   Psychiatric:         Mood and Affect: Mood normal.         Behavior: Behavior normal.         BG/insulin  reviewed per EMR.   Glucose, POC   Date Value   07/14/2024 108 mg/dL   87/92/7974 738 mg/dL (H)   87/92/7974 716 mg/dL (H)   87/92/7974 851 mg/dL   87/92/7974 97 mg/dL   87/93/7974 697 mg/dL (H) 87/93/7974 678 mg/dL (H)   87/93/7974 743 mg/dL (H)   89/76/7986 784 MG/DL (H)   90/74/7986 55 MG/DL (L)   90/74/7986 52 MG/DL (L)   93/78/7986 96 MG/DL   93/78/7986 844 MG/DL   93/78/7986 877 MG/DL  01/25/2012 231 MG/DL (H)   93/79/7986 91 MG/DL        Summary of labs:  Lab Results   Component Value Date    A1C 6.8 (H) 07/03/2024    A1C 6.2 (H) 01/07/2024    A1C 6.0 (H) 09/10/2023     Lab Results   Component Value Date    GFR 5.11 (L) 05/01/2012    CREATININE 1.57 (H) 07/13/2024     Lab Results   Component Value Date    WBC 19.0 (H) 07/13/2024    HGB 11.9 (L) 07/13/2024    HCT 36.9 (L) 07/13/2024    PLT 212 07/13/2024       Lab Results   Component Value Date    NA 146 (H) 07/13/2024    K 5.4 (H) 07/13/2024    CL 111 (H) 07/13/2024    CO2 22.0 07/13/2024    BUN 33 (H) 07/13/2024    CREATININE 1.57 (H) 07/13/2024    GLU 92 07/13/2024    CALCIUM 10.3 07/13/2024    MG 2.1 07/13/2024    PHOS 3.4 07/13/2024       Lab Results   Component Value Date    BILITOT 0.2 (L) 07/03/2024    BILIDIR 0.20 12/10/2023    PROT 7.2 07/03/2024    ALBUMIN 3.3 (L) 07/13/2024    ALT 17 07/03/2024    AST 20 07/03/2024    ALKPHOS 78 07/03/2024    GGT 20 06/21/2019                     [1]    [Provider Hold] amlodipine   10 mg Oral Daily    atorvastatin   40 mg Oral Daily    dexAMETHasone   4 mg Oral Q6H SCH    Followed by    NOREEN ON 07/15/2024] dexAMETHasone   2 mg Oral Q6H SCH    Followed by    NOREEN ON 07/18/2024] dexAMETHasone   2 mg Oral Q8H SCH    Followed by    NOREEN ON 07/20/2024] dexAMETHasone   2 mg Oral Q12H SCH    Followed by    NOREEN ON 07/22/2024] dexAMETHasone   1 mg Oral Q12H SCH    famotidine   20 mg Oral BID    heparin  (porcine) for subcutaneous use  5,000 Units Subcutaneous Q8H SCH    insulin  NPH  10 Units Subcutaneous Q12H [facility]    insulin  regular  5 Units Subcutaneous 4xd Meals & HS    insulin  regular  0-9 Units Subcutaneous 4xd Meals & HS    levETIRAcetam   500 mg Oral BID    senna  2 tablet Oral Nightly    tacrolimus   5 mg Oral Daily    valsartan   160 mg Oral Daily   [2]   Past Medical History:  Diagnosis Date    Anemia     Cancer    (CMS-HCC)     clear cell adenocarcinoma of left kidney    Diabetes mellitus (CMS-HCC)     Diabetic nephropathy (CMS-HCC)     ESRD (end stage renal disease) on dialysis    (CMS-HCC)     ESRD on dialysis (CMS-HCC)     Gout     Hyperlipidemia     Hypertension    [3]   Past Surgical History:  Procedure Laterality Date    AV FISTULA PLACEMENT      PR COLONOSCOPY FLX DX W/COLLJ SPEC WHEN PFRMD N/A 04/30/2018    Procedure: COLONOSCOPY, FLEXIBLE, PROXIMAL TO SPLENIC FLEXURE;  DIAGNOSTIC, W/WO COLLECTION SPECIMEN BY BRUSH OR WASH;  Surgeon: Dorn Lynwood Lauth, MD;  Location: [facility]ED[facility];  Service: Gastroenterology    PR COLONOSCOPY W/BIOPSY SINGLE/MULTIPLE N/A 04/30/2018    Procedure: COLONOSCOPY, FLEXIBLE, PROXIMAL TO SPLENIC FLEXURE; WITH BIOPSY, SINGLE OR MULTIPLE;  Surgeon: Dorn Lynwood Lauth, MD;  [facility];  Service: Gastroenterology    PR COLSC FLX W/RMVL OF TUMOR POLYP LESION SNARE TQ N/A 04/30/2018    Procedure: COLONOSCOPY FLEX; W/REMOV TUMOR/LES BY SNARE;  Surgeon: Dorn Lynwood Lauth, MD;  Location: [facility];  Service: Gastroenterology    PR EXPLORATORY OF ABDOMEN Midline 04/14/2016    Procedure: EXPLORATORY LAPAROTOMY, EXPLORATORY CELIOTOMY WITH OR WITHOUT BIOPSY(S);  Surgeon: Marsa Sam Boss, MD;  Location: MAIN OR [facility];  Service: Transplant    PR EXPLORATORY OF ABDOMEN N/A 04/16/2016    Procedure: EXPLORATORY LAPAROTOMY, EXPLORATORY CELIOTOMY WITH OR WITHOUT BIOPSY(S);  Surgeon: Marsa Sam Boss, MD;  Location: MAIN OR [facility];  Service: Transplant    PR LAP, RADICAL NEPHRECTOMY Right 12/27/2016    Procedure: Robotic Xi Laparoscopy; Radical Nephrectomy (Incl Remove Gerota`S Fascia, Fatty Tissue, Reg Lymph Node, Adrenalectomy);  Surgeon: Clorinda Ole Chalk, MD;  Location: MAIN OR [facility];  Service: Urology    PR LIGATN ANGIOACCESS AV FISTULA Left 11/29/2022    Procedure: LIGATION OR BANDING OF ANGIOACCESS ARTERIOVENOUS FISTULA;  Surgeon: [place], Marsa Sam, MD;  Location: MAIN OR [place];  Service: Transplant    PR NEGATIVE PRESSURE WOUND THERAPY DME >50 SQ CM N/A 04/12/2016    Procedure: NEG PRESS WOUND TX (VAC ASSIST) INCL TOPICALS, PER SESSION, TSA GREATER THAN/= 50 CM SQUARED;  Surgeon: Marsa Sam Boss, MD;  Location: MAIN OR [place];  Service: Transplant    PR NEGATIVE PRESSURE WOUND THERAPY DME >50 SQ CM Bilateral 04/14/2016    Procedure: NEG PRESS WOUND TX (VAC ASSIST) INCL TOPICALS, PER SESSION, TSA GREATER THAN/= 50 CM SQUARED;  Surgeon: Marsa Sam Boss, MD;  Location: MAIN OR [place];  Service: Transplant    PR NEGATIVE PRESSURE WOUND THERAPY DME >50 SQ CM N/A 04/16/2016    Procedure: NEG PRESS WOUND TX (VAC ASSIST) INCL TOPICALS, PER SESSION, TSA GREATER THAN/= 50 CM SQUARED;  Surgeon: Marsa Sam Boss, MD;  Location: MAIN OR [facility];  Service: Transplant    PR NEPHRECTOMY, W/PART. URETECTOMY Bilateral 04/11/2016    Procedure: LAPAROSCOPY, SURGICAL, NEPHRECTOMY WITH TOTAL URETERECTOMY;  Surgeon: Marsa Sam Boss, MD;  Location: MAIN OR [facility];  Service: Transplant    PR REMV KIDNEY,W/RIB RESECTION Bilateral 04/11/2016    Procedure: NEPHRECTOMY, INCLUDING PARTIAL URETERECTOMY, ANY OPEN APPROACH INCLUDING RIB RESECTION;  Surgeon: Marsa Sam Boss, MD;  Location: MAIN OR [facility];  Service: Transplant    PR TRANSPLANT,PREP LIVING  RENAL GRAFT N/A 06/22/2019    Procedure: BACKBENCH STD PREP LIVING DONOR RENAL ALLGRFT (OPEN/LAPROSC) PRIOR TO TRANSPLANT, INC DISSECT/REM AS NECESS;  Surgeon: Marsa Sam Boss, MD;  Location: MAIN OR [facility];  Service: Transplant    PR TRANSPLANTATION OF KIDNEY N/A 06/22/2019    Procedure: RENAL ALLOTRANSPLANTATION, IMPLANTATION OF GRAFT; WITHOUT RECIPIENT NEPHRECTOMY;  Surgeon: Marsa Sam Boss, MD;  Location: MAIN OR [facility];  Service: Transplant    SPLENECTOMY [4]   Family History  Problem Relation Age of Onset    Kidney disease Mother     Cancer Father     Kidney disease Brother    [5]   Social History  Tobacco Use    Smoking status: Never    Smokeless  tobacco: Never   Vaping Use    Vaping status: Never Used   Substance Use Topics    Alcohol use: No    Drug use: No

## 2024-07-14 NOTE — Consults (Signed)
 Acute Inpatient Rehab referral has been received and patient is currently under review.       Please be advised, [facility] is now located at the [facility].      Visitation Policy:  [facility] allows visitors (6 AM- 9 PM) and one visitor may be designated to stay overnight. The overnight visitor must be in the facility prior to 9 PM.       Estefana JONELLE Daring, RN,  BSN  [facility]  Inpatient Coordinator  Office: [phone]    2:21 PM  07/14/2024      Admission Criteria for Acute Inpatient Rehab:     The patient has medical need that will require daily physician oversight.  The patient requires at least two therapy disciplines (PT, OT, ST) at a high frequency.  It is anticipated that the patient will make significant functional improvement in a reasonable length of time.   The patient is able to participate in and tolerate a minimum of three hours of therapy per day.   The patient or his/her representative consents to the admission.  The patient/family has realistic goals that include discharge to a community setting (other than SNF) and has adequate assistance at home.

## 2024-07-14 NOTE — Plan of Care (Signed)
 Shift Summary  Insulin  lispro was administered multiple times during the shift in response to rising blood glucose levels.   Patient participated in OT therapeutic activity and self-care/home management training, requiring moderate to maximum assistance for ADLs and minimal to moderate assist for ambulation and transfers.   Wound and IV sites remained clean, dry, and intact, with no intervention needed and Braden Scale score improved during the shift.   Discharge planning is ongoing, with family present and involved, but facility and equipment needs remain undetermined.   Patient tolerated activity well, reported no pain, and overall status remained stable during the shift.    Optimal Comfort and Wellbeing: Pain scores remained at 0 throughout the shift and no complaints of pain were reported, with staff assisting in hygiene and patient tolerating activity well.    Readiness for Transition of Care: Discharge planning is ongoing, with family present and involved, and the patient and family were provided with choices for post-hospital care; however, discharge facility and equipment needs remain undetermined at this time.    Skin Health and Integrity: Wound on the head remained clean, dry, and intact with staples and no odor, and peripheral IV sites on the right forearm and left hand were clean, dry, and intact with no intervention needed; Braden Scale score improved during the shift and frequent weight shifts and repositioning were encouraged.    Improved Ability to Complete Activities of Daily Living: Patient required moderate to maximum assistance for most ADLs, minimal to moderate assist for ambulation and transfers, and used a front wheel walker; patient tolerated treatment well and was in agreement with PT session.    Absence of Infection Signs and Symptoms: No neuro symptoms were documented and temperature remained stable throughout the shift; wound and IV sites were clean and dry with no odor or intervention needed.

## 2024-07-14 NOTE — Consults (Signed)
 Physical Medicine and Rehabilitation  Inpatient Consult Note    Requesting Attending Physician: Rosaria CHRISTELLA Lush, MD  Service Requesting Consult: Neurosurgery [facility])  Thank you for this consult.  Please contact the PM&R consult pager [phone]) for questions regarding these recommendations.  For questions of bed availability at [facility], contact the Intake Office at [phone]. Please contact your case manager for updates on AIR process as they are in regular contact with the rehab intake office.    ASSESSMENT:   Frank Leach is a 73 y.o. 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    RECOMMENDATIONS:  #R-sided weakness ISO new brain mass  - Steroid taper + AED x 1 week  - SBP <160, Na >135  - Completed light precautions x 48 hours  - HOB >30  - Onc/Rad onc/neurosurgery/urology/palliative care involved  - Awaiting oncology plan    #RCC/DDKT/HTN  - Transplant nephrology involved: mycophenolate /olmesartan  on hold, tacro, amlodipine  per their recommendations    DISCHARGE RECOMMENDATIONS:  - Recommendation: ACUTE INPATIENT REHABILITATION (AIR).  - Patient/family are still deciding about rehab.    FOLLOW UP:  - Please place an Ambulatory referral by entering cancer rehab in Epic and select [facility] as the Department in the PM+R referral to schedule follow up with Dr. Trecia Maring.     AIR PLANNING:  *This PM&R consult does not guarantee that patient has been accepted to [facility] AIR, but can be helpful to guide patient progression*    Checklist For Potential Admission to [facility] AIR:  [ ]  EDD/what is estimated discharge date?  [ ]  Referral from CM if wants [place] AIR  [ ]  PT/OT within 48 hours of AIR  [ ]  dispo verified and [place] AIR preference  [ ]  CBC/BMP & other pertinent labs within 48 hours of AIR  [ ]  All SCI patients and those with hypercoagulable state (COVID, transplant, cancer, etc) need PVL BLE within one week prior to AIR  [ ]  Chemotherapy and biologics: Require AIR physician and nursing approval; Oral chemo and some infusions (Cytoxan) are often acceptable (M-F administration secondary to daytime nurse staffing).   The Intake Team will verify staffing availability with Nurse Management; IT chemo cannot be done at [facility] and those patients will need to stay at main until treatments are completed  [ ]  Chemotherapy plan documented  [ ]  Radiation therapy plan documented    Discharge Plan After AIR:  - patient lives with spouse in a 1 level home with 2STE.  - patient will have assistance from spouse after discharge.    In order for the patient to be considered for admission to [facility] inpatient rehab, the case manager must give the patient / family choice in post-acute discharge options. If the patient desires [place], a referral from case management is required for acceptance to [facility] inpatient rehab.  A referral for [facility] inpatient rehab has not been received.     Vena CHARLENA Lolling, MD  [facility] PM+R    SUBJECTIVE   Reason for Consult: Patient seen in consultation at the request of Rosaria CHRISTELLA Lush, MD for evaluation of rehabilitation needs and recommendations.    History of Present Illness: Frank Leach is a 73 y.o. male with a past medical history of RCC s/p bilateral nephrectomy, DDKT, ESRD on HD  currently hospitalized on 07/11/2024 for impaired mobility found to have a  new brain mass now s/p L craniotomy for resection.    Per review of EMR, he was admitted on 12/5 for the above procedure    Medical Complexity:  #R-sided weakness ISO new brain mass  - Steroid taper + AED x 1 week  - SBP <160, Na >135  - Completed light precautions x 48 hours  - HOB >30    #RCC/DDKT/HTN  - Transplant nephrology involved: mycophenolate /olmesartan  on hold, tacro, amlodipine  per their recommendations    #T2DM  - Home metformin  on hold  - BMP daily  - SSI PRN    # Impaired ADLs, fall risk, impaired functional gait, mobility, balance, and endurance  - Please continue to have patient work with PT and OT to maximize functional status with mobility and ADLs.  - Fall risk precautions and OOB to chair daily with assistance if appropriate  - Discussed rehab options today with patient. The patient/family/caregiver was counseled and educated on the purpose of AIR and questions/concerns were addressed.     Today  Patient was seen with family. He states he is feeling OK overall. He is not sure if he wants to go to rehab.    Past Medical and Surgical History:   Past Medical History[1]  Past Surgical History[2]     Cancer History:  As per HPI      Social History:   Short Social History[3]    Social History     Social History Narrative    Not on file       Living Environment: House  Lives With: Spouse (wife works several hours a day)  Home Living: One level home, Tub/shower unit, Hand-held shower hose, Handicapped height toilet, Grab bars in shower, Grab bars around toilet, Stairs to enter with rails, Shower chair without back  Rail placement (outside): Bilateral rails in reach  Number of Stairs to Enter (outside): 2    Family History: Reviewed, noncontributory to rehab  family history includes Cancer in his father; Kidney disease in his brother and mother.    Allergies:   Patient has no known allergies.    Medications:   Scheduled Scheduled meds with Route[4]  PRN acetaminophen , 650 mg, Q6H PRN  dextrose  in water , 12.5 g, Q15 Min PRN  hydrALAZINE , 10 mg, Q4H PRN  labetalol, 10 mg, Q4H PRN  melatonin, 3 mg, Nightly PRN  oxyCODONE , 5 mg, Q6H PRN      Continuous Infusions      Review of Systems:    General ROS: negative      Prior Functional Status:  Prior Functional Status: independent at baseline without a device    Current Functional Status: Therapy notes reviewed     Mobility:               PT Post Acute Discharge Recommendations: 5x weekly, High intensity    Cognition, Swallow, Speech:   Cognition / Swallow / Speech  Patient's Vision Adequate to Safely Complete Daily Activities: Yes  Patient's Judgement Adequate to Safely Complete Daily Activities: Yes  Patient's Memory Adequate to Safely Complete Daily Activities: Yes  Patient Able to Express Needs/Desires: Yes  Patient has speech problem: No             Assistive Devices: Bedside commode, Rolling walker, Straight cane    Precautions:  Safety Interventions  Safety Interventions: bed alarm, commode/urinal/bedpan at bedside, fall reduction program maintained, family at bedside, lighting adjusted for tasks/safety, low bed, nonskid shoes/slippers when out of bed  Aspiration Precautions: awake/alert before  oral intake  Bleeding Precautions: blood pressure closely monitored  Infection Management: aseptic technique maintained  Seizure Precautions: clutter-free environment maintained, activity supervised, emergency equipment at bedside, side rails padded, soft boundaries provided    OBJECTIVE:   Vitals:  Vitals:    07/13/24 2020 07/13/24 2332 07/14/24 0600 07/14/24 0730   BP: 147/73 140/66 133/71 131/55   Pulse: 58 51 (!) 47 61   Resp: 17 16 15 17    Temp: 36.1 ??C (97 ??F) 36.6 ??C (97.9 ??F) 36.5 ??C (97.7 ??F) 36.5 ??C (97.7 ??F)   TempSrc: Temporal Temporal Temporal Temporal   SpO2: 98% 98% 97% 96%   Weight:       Height:           Physical Exam:    GEN: Lying in bed in NAD.  HEENT: Atraumatic. Normocephalic. Moist mucous membranes. Trachea midline.  RESP: NWOB on RA.  CV: Warm and well-perfused.  PSYCH: Appropriate  SKIN: see wound care flow sheet    NEURO:  Mental Status: A&Ox3 (person, place, date), attention intact, speech fluid and coherent, follows commands well and answers questions appropriately  Motor:      Delt Bi Tri HG   (Delt= Deltoid, Bi= Biceps, Tri= Triceps, WE= Wrist extensor, HG= Hand Grip)  RUE 4 4 4 5      LUE 5 5 5 5                    HF KE       DF       PF    (HF= Hip flexor, KE= Knee Extensor, DF= Dorsiflexor, PF= Plantar Flexor)  RLE     4 4 4 5               LLE   5 5 5 5  Labs and Diagnostic Studies:    Personally reviewed   CBC - Results in Past 30 Days  Result Component Current Result Ref Range Previous Result Ref Range   HCT 36.9 (L) (07/13/2024) 39.0 - 48.0 % 35.0 (L) (07/12/2024) 39.0 - 48.0 %   HGB 11.9 (L) (07/13/2024) 12.9 - 16.5 g/dL 88.6 (L) (87/10/7972) 87.0 - 16.5 g/dL   MCH 71.1 (87/09/7972) 25.9 - 32.4 pg 28.8 (07/12/2024) 25.9 - 32.4 pg   MCHC 32.3 (07/13/2024) 32.0 - 36.0 g/dL 67.5 (87/10/7972) 67.9 - 36.0 g/dL   MCV 10.7 (87/09/7972) 77.6 - 95.7 fL 88.8 (07/12/2024) 77.6 - 95.7 fL   MPV 9.5 (07/13/2024) 6.8 - 10.7 fL 9.0 (07/12/2024) 6.8 - 10.7 fL   Platelet 212 (07/13/2024) 150 - 450 10*9/L 178 (07/12/2024) 150 - 450 10*9/L   RBC 4.14 (L) (07/13/2024) 4.26 - 5.60 10*12/L 3.94 (L) (07/12/2024) 4.26 - 5.60 10*12/L   WBC 19.0 (H) (07/13/2024) 3.6 - 11.2 10*9/L 21.1 (H) (07/12/2024) 3.6 - 11.2 10*9/L     BMP - Results in Past 30 Days  Result Component Current Result Ref Range Previous Result Ref Range   BUN 33 (H) (07/13/2024) 9 - 23 mg/dL 24 (H) (87/10/7972) 9 - 23 mg/dL   Chloride 888 (H) (87/09/7972) 98 - 107 mmol/L 114 (H) (07/12/2024) 98 - 107 mmol/L   CO2 22.0 (07/13/2024) 20.0 - 31.0 mmol/L 17.0 (L) (07/12/2024) 20.0 - 31.0 mmol/L   Creatinine 1.57 (H) (07/13/2024) 0.73 - 1.18 mg/dL 8.52 (H) (87/10/7972) 9.26 - 1.18 mg/dL   Glucose 92 (87/09/7972) 70 - 179 mg/dL 843 (87/10/7972) 70 - 820 mg/dL   Potassium 5.4 (H) (87/09/7972) 3.5 - 5.1 mmol/L 5.0 (H) (07/13/2024) 3.4 -  4.8 mmol/L   Sodium 146 (H) (07/13/2024) 135 - 145 mmol/L 142 (07/12/2024) 135 - 145 mmol/L     LFT's - Results in Past 30 Days  Result Component Current Result Ref Range Previous Result Ref Range   Albumin 3.3 (L) (07/13/2024) 3.4 - 5.0 g/dL 4.1 (88/72/7974) 3.4 - 5.0 g/dL   Alkaline Phosphatase 78 (07/03/2024) 46 - 116 U/L Not in Time Range    ALT 17 (07/03/2024) 10 - 49 U/L Not in Time Range    AST 20 (07/03/2024) <=34 U/L Not in Time Range    Total Bilirubin 0.2 (L) (07/03/2024) 0.3 - 1.2 mg/dL Not in Time Range Radiology Results:    Personally reviewed MRI brain from 07/12/24 - post-operative changes noted in L frontal lobe; agree with report as below:  Impression   --Postoperative changes of resection of the left frontal mass with small volume subdural and resection bed blood products. Nodular enhancement around the surgical bed along the inferior aspect of the resection bed may represent residual disease.        This encounter was an inpatient consultation.         [1]   Past Medical History:  Diagnosis Date    Anemia     Cancer    (CMS-HCC)     clear cell adenocarcinoma of left kidney    Diabetes mellitus (CMS-HCC)     Diabetic nephropathy (CMS-HCC)     ESRD (end stage renal disease) on dialysis    (CMS-HCC)     ESRD on dialysis (CMS-HCC)     Gout     Hyperlipidemia     Hypertension    [2]   Past Surgical History:  Procedure Laterality Date    AV FISTULA PLACEMENT      PR COLONOSCOPY FLX DX W/COLLJ SPEC WHEN PFRMD N/A 04/30/2018    Procedure: COLONOSCOPY, FLEXIBLE, PROXIMAL TO SPLENIC FLEXURE; DIAGNOSTIC, W/WO COLLECTION SPECIMEN BY BRUSH OR WASH;  Surgeon: Dorn Lynwood Lauth, MD;  Location: [facility] PROCED[facility];  Service: Gastroenterology    PR COLONOSCOPY W/BIOPSY SINGLE/MULTIPLE N/A 04/30/2018    Procedure: COLONOSCOPY, FLEXIBLE, PROXIMAL TO SPLENIC FLEXURE; WITH BIOPSY, SINGLE OR MULTIPLE;  Surgeon: Dorn Lynwood Lauth, MD;  Location: [facility] PROCED[facility];  Service: Gastroenterology    PR COLSC FLX W/RMVL OF TUMOR POLYP LESION SNARE TQ N/A 04/30/2018    Procedure: COLONOSCOPY FLEX; W/REMOV TUMOR/LES BY SNARE;  Surgeon: Dorn Lynwood Lauth, MD;  Location: [facility]ED[facility];  Service: Gastroenterology    PR EXPLORATORY OF ABDOMEN Midline 04/14/2016    Procedure: EXPLORATORY LAPAROTOMY, EXPLORATORY CELIOTOMY WITH OR WITHOUT BIOPSY(S);  Surgeon: Marsa Sam Boss, MD;  Location: MAIN OR [facility];  Service: Transplant    PR EXPLORATORY OF ABDOMEN N/A 04/16/2016    Procedure: EXPLORATORY LAPAROTOMY, EXPLORATORY CELIOTOMY WITH OR WITHOUT BIOPSY(S);  Surgeon: Marsa Sam Boss, MD;  Location: MAIN OR [facility];  Service: Transplant    PR LAP, RADICAL NEPHRECTOMY Right 12/27/2016    Procedure: Robotic Xi Laparoscopy; Radical Nephrectomy (Incl Remove Gerota`S Fascia, Fatty Tissue, Reg Lymph Node, Adrenalectomy);  Surgeon: Clorinda Ole Chalk, MD;  Location: MAIN OR [facility];  Service: Urology    PR LIGATN ANGIOACCESS AV FISTULA Left 11/29/2022    Procedure: LIGATION OR BANDING OF ANGIOACCESS ARTERIOVENOUS FISTULA;  Surgeon: [place], Marsa Sam, MD;  Location: MAIN OR [facility];  Service: Transplant    PR NEGATIVE PRESSURE WOUND THERAPY DME >50 SQ CM N/A 04/12/2016    Procedure: NEG PRESS WOUND TX (VAC ASSIST)  INCL TOPICALS, PER SESSION, TSA GREATER THAN/= 50 CM SQUARED;  Surgeon: Marsa Sam Boss, MD;  Location: MAIN OR [facility];  Service: Transplant    PR NEGATIVE PRESSURE WOUND THERAPY DME >50 SQ CM Bilateral 04/14/2016    Procedure: NEG PRESS WOUND TX (VAC ASSIST) INCL TOPICALS, PER SESSION, TSA GREATER THAN/= 50 CM SQUARED;  Surgeon: Marsa Sam Boss, MD;  Location: MAIN OR [place];  Service: Transplant    PR NEGATIVE PRESSURE WOUND THERAPY DME >50 SQ CM N/A 04/16/2016    Procedure: NEG PRESS WOUND TX (VAC ASSIST) INCL TOPICALS, PER SESSION, TSA GREATER THAN/= 50 CM SQUARED;  Surgeon: Marsa Sam Boss, MD;  Location: MAIN OR [facility];  Service: Transplant    PR NEPHRECTOMY, W/PART. URETECTOMY Bilateral 04/11/2016    Procedure: LAPAROSCOPY, SURGICAL, NEPHRECTOMY WITH TOTAL URETERECTOMY;  Surgeon: Marsa Sam Boss, MD;  Location: MAIN OR [facility];  Service: Transplant    PR REMV KIDNEY,W/RIB RESECTION Bilateral 04/11/2016    Procedure: NEPHRECTOMY, INCLUDING PARTIAL URETERECTOMY, ANY OPEN APPROACH INCLUDING RIB RESECTION;  Surgeon: Marsa Sam Boss, MD;  Location: MAIN OR [facility];  Service: Transplant    PR TRANSPLANT,PREP LIVING  RENAL GRAFT N/A 06/22/2019    Procedure: BACKBENCH STD PREP LIVING DONOR RENAL ALLGRFT (OPEN/LAPROSC) PRIOR TO TRANSPLANT, INC DISSECT/REM AS NECESS;  Surgeon: Marsa Sam Boss, MD;  Location: MAIN OR [facility];  Service: Transplant    PR TRANSPLANTATION OF KIDNEY N/A 06/22/2019    Procedure: RENAL ALLOTRANSPLANTATION, IMPLANTATION OF GRAFT; WITHOUT RECIPIENT NEPHRECTOMY;  Surgeon: Marsa Sam Boss, MD;  Location: MAIN OR [facility];  Service: Transplant    SPLENECTOMY     [3]   Social History  Tobacco Use    Smoking status: Never    Smokeless tobacco: Never   Vaping Use    Vaping status: Never Used   Substance Use Topics    Alcohol use: No    Drug use: No   [4]    [Provider Hold] amlodipine  (NORVASC ) tablet 10 mg Daily    atorvastatin  (LIPITOR ) tablet 40 mg Daily    dexAMETHasone  (DECADRON ) tablet 4 mg Q6H SCH    famotidine  (PEPCID ) tablet 20 mg BID    heparin  (porcine) 5,000 unit/mL injection 5,000 Units Q8H SCH    insulin  lispro (HumaLOG ) inj PERCENTAGE MEAL EATEN 6 Units 3xd Meals    insulin  lispro (HumaLOG ) injection CORRECTIONAL 0-20 Units ACHS    insulin  NPH (HumuLIN ,NovoLIN ) injection 7 Units Q12H [facility]    levETIRAcetam  (KEPPRA ) tablet 500 mg BID    senna (SENOKOT) tablet 2 tablet Nightly    tacrolimus  (ENVARSUS  XR) extended release tablet 5 mg Daily    valsartan  (DIOVAN ) tablet 160 mg Daily

## 2024-07-14 NOTE — Plan of Care (Signed)
 Shift Summary  Heparin  was administered during the shift.   Blood glucose was elevated at 261 mg/dL, with correctional insulin  administered.   Frequent repositioning and use of pressure reduction devices were implemented to support skin integrity.   Family was present and updated multiple times during the shift.   Overall, skin and wound status remained stable, and mobility needs were addressed with assistance.     Rounds/Family Conference: Family was present at bedside and updated throughout the shift during multiple visits.     Skin Health and Integrity: Skin assessments noted bruising and a surgical site, with interventions including frequent repositioning, use of pressure-redistributing mattress, and positioning supports; no new skin breakdown documented.     Improved Ability to Complete Activities of Daily Living: Mobility remained very limited and bedfast, with assistance provided for weight shifts and no change in motor responses or strength.     Optimal Wound Healing: Surgical site on skin was intact, with sutures in place, open to air, no dressing, clean peri-wound, and no odor or drainage noted.

## 2024-07-14 NOTE — Consults (Signed)
 Tacrolimus  Therapeutic Monitoring Pharmacy Note    Frank Leach is a 73 y.o. male continuing tacrolimus .     Indication: Kidney transplant     Date of Transplant: 06/22/2019      Prior Dosing Information: Home regimen Envarsus  5 mg PO daily     Source(s) of information used to determine prior to admission dosing: Patient/Caregiver, Home Medication List, or Clinic Note    Goals:  Therapeutic Drug Levels  Tacrolimus  trough goal: 4-6 ng/mL    Additional Clinical Monitoring/Outcomes  Monitor renal function (SCr and urine output) and liver function (LFTs)  Monitor for signs/symptoms of adverse events (e.g., hyperglycemia, hyperkalemia, hypomagnesemia, hypertension, headache, tremor)    Previous Lab Results:  Tacrolimus , Trough   Date Value Ref Range Status   07/13/2024 1.5 (L) 5.0 - 15.0 ng/mL Final   07/12/2024 1.5 (L) 5.0 - 15.0 ng/mL Final   07/05/2024 5.1 5.0 - 15.0 ng/mL Final   07/03/2024 7.6 5.0 - 15.0 ng/mL Final   06/09/2024 5.3 5.0 - 15.0 ng/mL Final     Tacrolimus , Timed   Date Value Ref Range Status   07/07/2024 3.9 ng/mL Final     Creatinine   Date Value Ref Range Status   07/14/2024 1.75 (H) 0.73 - 1.18 mg/dL Final   87/92/7974 8.42 (H) 0.73 - 1.18 mg/dL Final   87/93/7974 8.52 (H) 0.73 - 1.18 mg/dL Final        Result:  87/8 @0357 : 3.9 ng/mL  12/6 @0409 : 1.5 ng/mL  12/7 @0504 : 1.5 ng/mL     Pharmacokinetic Considerations and Significant Drug Interactions:  Concurrent CYP3A4 substrates/inhibitors: amlodipine     Assessment/Plan:  Recommendation(s)  Continue current regimen of Envarsus  5 mg PO daily  Level on 12/8 drawn after AM dose already given. Therefore, today's level is likely inaccurate. Will plan to continue with current dose. Pending tomorrow's level, will adjust dose accordingly.   Of note, SCR trending up. Will continue to monitor.     Follow-up  Daily levels have been ordered at 0600.   A pharmacist will continue to monitor and recommend levels as appropriate    Please page service pharmacist with questions/clarifications.    Vernell Cumming, PharmD

## 2024-07-14 NOTE — Consults (Signed)
 Care Management  Initial Transition Planning Assessment              General  Care Manager / Social Worker assessed the patient by : Medical record review, Discussion with Clinical Care team, In person interview with patient  Orientation Level: Oriented X4  Functional level prior to admission: Independent  Reason for referral: Discharge Planning    Contact/Decision Maker  Extended Emergency Contact Information  Primary Emergency Contact: Echeverria,Shirley   [place]  Home Phone: [phone]  Mobile Phone: [phone]  Relation: Spouse  Interpreter needed? No  Secondary Emergency Contact: Parquet,Cheryl   [place]  Mobile Phone: [phone]  Relation: Daughter    Legal Next of Kin / Guardian / POA / Advance Directives     HCDM (patient stated preference): Bottino,Shirley - Spouse - [phone]    Advance Directive (Medical Treatment)  Does patient have an advance directive covering medical treatment?: Patient has advance directive covering medical treatment, copy in chart.  Reason patient does not have an advance directive covering medical treatment:: Patient needs follow-up to complete one.    Health Care Decision Maker [HCDM] (Medical & Mental Health Treatment)  Healthcare Decision Maker: HCDM documented in the HCDM/Contact Info section.  Information offered on HCDM, Medical & Mental Health advance directives:: Patient given information.    Advance Directive (Mental Health Treatment)  Does patient have an advance directive covering mental health treatment?: Patient does not have advance directive covering mental health treatment.  Reason patient does not have an advance directive covering mental health treatment:: Patient does not wish to complete one at this time.    Readmission Information                                     Did the following happen with your discharge?                                                     Patient Information  Lives with: Spouse/significant other    Type of Residence: Private residence        Location/Detail: [address]  [place] [address]    Support Systems/Concerns: Spouse, Family Members, Friends/Neighbors    Responsibilities/Dependents at home?: No    Home Care services in place prior to admission?: No                  Equipment Currently Used at Home: none       Currently receiving outpatient dialysis?: No       Financial Information       Need for financial assistance?: No       Social Drivers of Health  Social Drivers of Health were addressed in provider documentation.  Please refer to patient history.  Social Drivers of Health     Food Insecurity: No Food Insecurity (07/07/2024)    Hunger Vital Sign     Worried About Running Out of Food in the Last Year: Never true     Ran Out of Food in the Last Year: Never true   Tobacco Use: Low Risk (07/12/2024)    Patient History     Smoking Tobacco Use: Never     Smokeless Tobacco Use: Never     Passive  Exposure: Not on file   Transportation Needs: No Transportation Needs (07/07/2024)    PRAPARE - Therapist, Art (Medical): No     Lack of Transportation (Non-Medical): No   Alcohol Use: Not on file   Housing: Low Risk (07/07/2024)    Housing     Within the past 12 months, have you ever stayed: outside, in a car, in a tent, in an overnight shelter, or temporarily in someone else's home (i.e. couch-surfing)?: No     Are you worried about losing your housing?: No   Physical Activity: Not on file   Utilities: Low Risk (07/07/2024)    Utilities     Within the past 12 months, have you been unable to get utilities (heat, electricity) when it was really needed?: No   Stress: Not on file   Interpersonal Safety: Not At Risk (07/11/2024)    Interpersonal Safety     Unsafe Where You Currently Live: No     Physically Hurt by Anyone: No     Abused by Anyone: No   Substance Use: Not on file (06/11/2023)   Intimate Partner Violence: Not on file   Social Connections: Not on file   Financial Resource Strain: Low Risk (07/07/2024)    Overall Financial Resource Strain (CARDIA)     Difficulty of Paying Living Expenses: Not hard at all   Health Literacy: Not on file   Internet Connectivity: Not on file       Complex Discharge Information                                                                    Interventions:       Discharge Needs Assessment  Concerns to be Addressed: discharge planning    Clinical Risk Factors: > 65, Multiple Diagnoses (Chronic), New Diagnosis, Principal Diagnosis: Cancer, Stroke, COPD, Heart Failure, AMI, Pneumonia, Joint Replacment    Barriers to taking medications: No                   Anticipated Changes Related to Illness: other (see comments) (unable to determine at this time)    Equipment Needed After Discharge: other (see comments) (unable to determine at this time)    Discharge Facility/Level of Care Needs: other (see comments) (unable to determine at this time)    Readmission  Risk of Unplanned Readmission Score: UNPLANNED READMISSION SCORE: 22.29%  Predictive Model Details          22%  Factor Value    Calculated 07/14/2024 12:04 21% Number of active inpatient medication orders 34     Risk of Unplanned Readmission Model 9% Diagnosis of cancer present     8% ECG/EKG order present in last 6 months     7% Latest BUN high (30 mg/dL)     6% Imaging order present in last 6 months     5% Latest hemoglobin low (11.9 g/dL)     5% Phosphorous result present     5% Age 73     5% Number of ED visits in last six months 1     4% Number of hospitalizations in last year 1     4% Active anticoagulant inpatient medication order present  4% Active corticosteroid inpatient medication order present     4% Latest creatinine high (1.75 mg/dL)     3% Diagnosis of renal failure present     3% Charlson Comorbidity Index 3     3% Current length of stay 3.098 days     2% Future appointment scheduled     1% Active ulcer inpatient medication order present      Readmitted Within the Last 30 Days? (No if blank) Yes  Patient at risk for readmission?: No    Discharge Plan       Expected Discharge Date: 07/16/2024    Expected Transfer from Critical Care:      Quality data for continuing care services shared with patient and/or representative?: Yes  Patient and/or family were provided with choice of facilities / services that are available and appropriate to meet post hospital care needs?: Yes       Initial Assessment complete?: Yes    Type of Residence: Mailing Address:  [address]  [place] [address]  Contacts:    Patient Phone Number: [phone]  Extended Emergency Contact Information  Primary Emergency Contact: Lavergne,Shirley   [place]  Home Phone: [phone]  Mobile Phone: [phone]  Relation: Spouse  Interpreter needed? 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  Past Surgical History:   has a past surgical history that includes AV fistula placement; pr negative pressure wound therapy dme >50 sq cm (N/A, 04/12/2016); pr nephrectomy, w/part. uretectomy (Bilateral, 04/11/2016); pr remv kidney,w/rib resection (Bilateral, 04/11/2016); pr exploratory of abdomen (Midline, 04/14/2016); pr negative pressure wound therapy dme >50 sq cm (Bilateral, 04/14/2016); pr negative pressure wound therapy dme >50 sq cm (N/A, 04/16/2016); pr exploratory of abdomen (N/A, 04/16/2016); pr lap, radical nephrectomy (Right, 12/27/2016); Splenectomy; pr colonoscopy flx dx w/collj spec when pfrmd (N/A, 04/30/2018); pr colsc flx w/rmvl of tumor polyp lesion snare tq (N/A, 04/30/2018); pr colonoscopy w/biopsy single/multiple (N/A, 04/30/2018); pr transplantation of kidney (N/A, 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   Previous admit date: 07/04/2024    Primary Insurance- Payor: [employer] ADV / Plan: [employer] MEDICARE ADV / Product Type: *No Product type* /   Secondary Insurance - None  Prescription Coverage -   Preferred Pharmacy - [facility]  [facility]  [facility] - [place] - [address]  [place] [facility]  [id number] - [place], [place] - [address] [place] [address]  [facility] - [place], [place] - [address]  [facility]    Transportation home: unable to determine at this time

## 2024-07-14 NOTE — Consults (Signed)
 Facility Information: [facility]   Facility ID: [id number]  Facility Medicare Provider Number: [id number]     Physical Medicine and Rehabilitation  Rehab Pre-Admit Screening  Date: 07/14/2024   Time: 2:58 PM     Patient Information:    Patient Name:  Frank Leach Medical Record Number: [id number]   Address:  [address] [place] [address] Sex: Male   Date of Birth: May 08, 1951 Age: 73 y.o.   Room/Bed:  6413/6413-01    _____________________________________________________________________________    Attending Physician's Review and Admission Determination   (Instructions for Physician: Type the smartphrase rehab below and then Co-Sign)            _____________________________________________________________________________    Advanced Directives:  Does patient have an advance directive covering medical treatment?: Patient has advance directive covering medical treatment, copy in chart.       Reason patient does not have an advance directive covering medical treatment:: Patient needs follow-up to complete one.      Does patient have an advance directive covering mental health treatment?: Patient does not have advance directive covering mental health treatment.       Reason patient does not have an advance directive covering mental health treatment:: Patient does not wish to complete one at this time.      Coverage Information:  Authorization number:    Authorization Code:  Activation code not generated  Current My [place] Chart Status: Active  Payor: [employer] ADV / Plan: [employer]ARE ADV / Product Type: *No Product type* /     Physician/Referral Information:  Referring Facility: [facility]    Referring Case Manager: Frank Leach      Patient / Family / Caregiver Orientation: Pt and family in agreement with [facility]      Prior Living Situation:  Living Environment: House    Lives With: Spouse    Home Living: One level home; Tub/shower unit; Hand-held shower hose; Handicapped height toilet; Grab bars in shower; Grab bars around toilet; Stairs to enter with rails; Shower chair without back    Rail placement (outside): Bilateral rails in reach    Rail placement (inside): Rail on right side    Number of Stairs to Alternate level (inside): 2    Prior Level of Function:  Prior Function Comments: At baseline he is active and Independent with ADLs, IADLs, and functional mobility skills without assistive device. Active, enjoys yard work. No recent falls.     Rehabilitation Diagnosis:  Etiologic Diagnosis / Description: Frank Leach is a 73 y.o. male with a past medical history of RCC s/p bilateral nephrectomy, DDKT, ESRD on HD  admitted to [facility] on  07/03/2024 for impaired mobility found to have a new brain mass, now s/p left craniotomy for resection (12/5). Frank Leach has participated in acute inpatient physical and occupational therapies to improve functional mobility, activity tolerance, functional strength, balance, and endurance in order to facilitate safe performance of ADLs and daily routines. Frank Leach has been referred to [facility] for continued acute medical management, provision of intensive inpatient therapies, and patient/family training to facilitate safe performance of ADLs and mobility, prior to discharge home.      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 Medical management/administration of opioid analgesics, ,anticonvulsants, anticoagulants, antihypertensives, diabetic agents, corticosteroids, vasodilators, and other prescribed/necessary medications.  Monitoring/management of pain, cardiopulmonary status, renal status, gastrointestinal status, neurological status, infection, bleeding, bowel and bladder, DVT, and skin breakdown.      Risk for Medical / Clinical Complication: Patient at increased risk for complications of cardiopulmonary insufficiency, renal insufficiency, hypertensive crisis, gastrointestinal insufficiency, neurological decline, seizures, fluid volume overload, aspiration, bleeding, infection, uncontrolled pain, bowel/bladder retention, falls, and skin breakdown.      Special Rehabilitation Needs:     Hearing - Right Ear: Functional    Hearing - Left Ear: Functional       Cultural Requests During Hospitalization: Pt at increased risk of anxiety/depression related to recent medical status change    Safety Equipment at Bedside: AMBU Bag; Emergency airway equipment; Suction; Emergency medications (per policy)    Nutrition Type: General     Requires modified schedule: Pt may require rest breaks between therapy sessions      Precautions: Falls precautions; Other precautions       Required Braces or Orthoses: Non-applicable    Past Medical History:  Past Medical History[1]  Past Surgical History:  Past Surgical History[2]  Family History:  Family History[3]  Social History:  Social History[4]  Current Medications and Prescriptions Ordered in Epic[5]  Prescriptions Prior to Admission[6]       Vitals:    07/13/24 2332 07/14/24 0600 07/14/24 0730 07/14/24 1115   BP: 140/66 133/71 131/55 136/67   Pulse: 51 (!) 47 61 57   Resp: 16 15 17 17    Temp: 36.6 ??C (97.9 ??F) 36.5 ??C (97.7 ??F) 36.5 ??C (97.7 ??F) 36.5 ??C (97.7 ??F)   TempSrc: Temporal Temporal Temporal    SpO2: 98% 97% 96% 97%   Weight:       Height:           Vitals:    Height:  170.2 cm (5' 7.01), Weight: 73 kg (160 lb 15 oz)    Labs:      CBC -   Lab Results   Component Value Date    WBC 19.0 (H) 07/13/2024    RBC 4.14 (L) 07/13/2024    HGB 11.9 (L) 07/13/2024    HCT 36.9 (L) 07/13/2024    MCV 89.2 07/13/2024    MCH 28.8 07/13/2024    MCHC 32.3 07/13/2024    MPV 9.5 07/13/2024    PLT 212 07/13/2024     BMP -   Lab Results   Component Value Date    NA 142 07/14/2024    K 5.0 (H) 07/14/2024    CL 108 (H) 07/14/2024    CO2 25.0 07/14/2024    BUN 30 (H) 07/14/2024    CREATININE 1.75 (H) 07/14/2024    GFR 5.11 (L) 05/01/2012  GFRAAM 52 (L) 12/06/2020    GFRNAAM 45 (L) 12/06/2020    GLU 242 (H) 07/14/2024     CARD -   Lab Results   Component Value Date    CKTOTAL 247.0 (H) 04/12/2016    CKMB 2.83 04/12/2016    TROPONINI <0.034 04/12/2016     Coagulation -   Lab Results   Component Value Date    PT 12.0 01/19/2022    INR 1.06 01/19/2022    APTT 27.9 01/19/2022     ABGs-   Lab Results   Component Value Date    PHART 7.34 (L) 07/11/2024    PO2ART 157.0 (H) 07/11/2024    PCO2ART 33.4 (L) 07/11/2024    BEART -7.3 (L) 07/11/2024    HCO3ART 17 (L) 07/11/2024    O2SATART 99.4 07/11/2024     LFT's -   Lab Results   Component Value Date    ALBUMIN 3.3 (L) 07/13/2024    ALT 17 07/03/2024    AST 20 07/03/2024    ALKPHOS 78 07/03/2024    BILITOT 0.2 (L) 07/03/2024    BILIDIR 0.20 12/10/2023    PROT 7.2 07/03/2024       Was the influenza vaccine received in this facility during this year's vaccination season?  If yes date: 07/08/2024  If no comment:    Current Functional Status:     Feeding : Min assist    Grooming: Min assist    Bathing: Min assist    Toileting: Mod assist    UB Dressing: Mod assist    LB Dressing: Mod assist    IADLs: NT      Mobility:   Bed Mobility comments: supine to sitting EOB with minA for RLE management, HOB elevated, use of handrail    Transfers- STS from EOB using RW and minA, posterior lean, verbal cues for hand placement and upright posture     Ambulation comments: pt amb 168ft using RW and modA for mild instability. appropriate gait speed, poor R foot clearance due to fatigue, verbal cues for increaseing step length, hand placement, safety awareness, and RW positioning.    Stairs: NT     Cognition/Swallow/Speech: AOx4, clear speech, following commands, regular diet    Patient's Vision Adequate to Safely Complete Daily Activities: Recent Change    Patient's Judgement Adequate to Safely Complete Daily Activities: Recent Change    Patient's Memory Adequate to Safely Complete Daily Activities: Recent Change    Patient Able to Express Needs/Desires: Yes    Patient has speech problem: No    Willingness to Participate: Pt able and willing to participate in 3 hours of therapy.      Rehab Goals and Plan    Expected level of improvement for safe discharge: Patient will discharge home as independent as possible with ADLs and functional mobility with least restrictive assistive device for household distances.     Patient / Family Goals: Patient will safely return home with family, modified independence in ADLs and assist as needed with ambulation for household distances.    Required treatments and services: Rehab nursing, Case management, Dietician/nutrition     Anticipated Interventions:    Physical Therapy: 60-120 min/day 5-7 days/wk  Occupational Therapy: 60-120 min/day 5-7 days/wk    Recreational therapy: 30 min/day 3 days/wk  Prosthetics and Orthotics: As Needed      Anticipated services upon discharge:     Outpatient therapy: PT, OT    Expected discharge destination: Home with     Discharge  support: Patient has a caregiver available    Patient/family/caregiver orientation: Patient and family agreeable to inpatient rehab plan    Estimated Length of Stay: 7 days    Projected Admission Date: Wednesday July 16, 2024    Reviewer's Signature, Date and Time: Estefana JONELLE Daring, RN,  BSN  [facility]  Inpatient Coordinator  Office: [phone]    3:02 PM  07/14/2024        [1]   Past Medical History:  Diagnosis Date    Anemia     Cancer    (CMS-HCC)     clear cell adenocarcinoma of left kidney    Diabetes mellitus (CMS-HCC)     Diabetic nephropathy (CMS-HCC)     ESRD (end stage renal disease) on dialysis    (CMS-HCC)     ESRD on dialysis (CMS-HCC)     Gout     Hyperlipidemia     Hypertension    [2]   Past Surgical History:  Procedure Laterality Date    AV FISTULA PLACEMENT      PR COLONOSCOPY FLX DX W/COLLJ SPEC WHEN PFRMD N/A 04/30/2018    Procedure: COLONOSCOPY, FLEXIBLE, PROXIMAL TO SPLENIC FLEXURE; DIAGNOSTIC, W/WO COLLECTION SPECIMEN BY BRUSH OR WASH;  Surgeon: Dorn Lynwood Lauth, MD;  Location: [facility]ED[facility];  Service: Gastroenterology    PR COLONOSCOPY W/BIOPSY SINGLE/MULTIPLE N/A 04/30/2018    Procedure: COLONOSCOPY, FLEXIBLE, PROXIMAL TO SPLENIC FLEXURE; WITH BIOPSY, SINGLE OR MULTIPLE;  Surgeon: Dorn Lynwood Lauth, MD;  Location: [facility];  Service: Gastroenterology    PR COLSC FLX W/RMVL OF TUMOR POLYP LESION SNARE TQ N/A 04/30/2018    Procedure: COLONOSCOPY FLEX; W/REMOV TUMOR/LES BY SNARE;  Surgeon: Dorn Lynwood Lauth, MD;  Location: [facility];  Service: Gastroenterology    PR EXPLORATORY OF ABDOMEN Midline 04/14/2016    Procedure: EXPLORATORY LAPAROTOMY, EXPLORATORY CELIOTOMY WITH OR WITHOUT BIOPSY(S);  Surgeon: Marsa Sam Boss, MD;  [facility];  Service: Transplant    PR EXPLORATORY OF ABDOMEN N/A 04/16/2016    Procedure: EXPLORATORY LAPAROTOMY, EXPLORATORY CELIOTOMY WITH OR WITHOUT BIOPSY(S);  Surgeon: Marsa Sam Boss, MD;  Location: MAIN OR [facility];  Service: Transplant    PR LAP, RADICAL NEPHRECTOMY Right 12/27/2016    Procedure: Robotic Xi Laparoscopy; Radical Nephrectomy (Incl Remove Gerota`S Fascia, Fatty Tissue, Reg Lymph Node, Adrenalectomy);  Surgeon: Clorinda Ole Chalk, MD;  Location: MAIN OR [facility];  Service: Urology    PR LIGATN ANGIOACCESS AV FISTULA Left 11/29/2022    Procedure: LIGATION OR BANDING OF ANGIOACCESS ARTERIOVENOUS FISTULA;  Surgeon: Toledo, Marsa Sam, MD;  Location: MAIN OR [place];  Service: Transplant    PR NEGATIVE PRESSURE WOUND THERAPY DME >50 SQ CM N/A 04/12/2016    Procedure: NEG PRESS WOUND TX (VAC ASSIST) INCL TOPICALS, PER SESSION, TSA GREATER THAN/= 50 CM SQUARED;  Surgeon: Marsa Sam Boss, MD;  Location: MAIN OR [place];  Service: Transplant    PR NEGATIVE PRESSURE WOUND THERAPY DME >50 SQ CM Bilateral 04/14/2016    Procedure: NEG PRESS WOUND TX (VAC ASSIST) INCL TOPICALS, PER SESSION, TSA GREATER THAN/= 50 CM SQUARED;  Surgeon: Marsa Sam Boss, MD;  Location: MAIN OR [place];  Service: Transplant    PR NEGATIVE PRESSURE WOUND THERAPY DME >50 SQ CM N/A 04/16/2016    Procedure: NEG PRESS WOUND TX (VAC ASSIST) INCL TOPICALS, PER SESSION, TSA GREATER THAN/= 50 CM SQUARED;  Surgeon: Marsa Sam Boss, MD;  Location: MAIN OR [facility];  Service: Transplant  PR NEPHRECTOMY, W/PART. URETECTOMY Bilateral 04/11/2016    Procedure: LAPAROSCOPY, SURGICAL, NEPHRECTOMY WITH TOTAL URETERECTOMY;  Surgeon: Marsa Sam Boss, MD;  Location: MAIN OR [facility];  Service: Transplant    PR REMV KIDNEY,W/RIB RESECTION Bilateral 04/11/2016    Procedure: NEPHRECTOMY, INCLUDING PARTIAL URETERECTOMY, ANY OPEN APPROACH INCLUDING RIB RESECTION;  Surgeon: Marsa Sam Boss, MD;  Location: MAIN OR [facility];  Service: Transplant    PR TRANSPLANT,PREP LIVING  RENAL GRAFT N/A 06/22/2019    Procedure: BACKBENCH STD PREP LIVING DONOR RENAL ALLGRFT (OPEN/LAPROSC) PRIOR TO TRANSPLANT, INC DISSECT/REM AS NECESS;  Surgeon: Marsa Sam Boss, MD;  Location: MAIN OR [facility];  Service: Transplant    PR TRANSPLANTATION OF KIDNEY N/A 06/22/2019    Procedure: RENAL ALLOTRANSPLANTATION, IMPLANTATION OF GRAFT; WITHOUT RECIPIENT NEPHRECTOMY;  Surgeon: Marsa Sam Boss, MD;  Location: MAIN OR [facility];  Service: Transplant    SPLENECTOMY     [3]   Family History  Problem Relation Age of Onset    Kidney disease Mother     Cancer Father     Kidney disease Brother    [4]   Social History  Socioeconomic History    Marital status: Married     Spouse name: Yaakov Saindon    Number of children: 1    Years of education: None    Highest education level: High school graduate   Tobacco Use    Smoking status: Never    Smokeless tobacco: Never   Vaping Use    Vaping status: Never Used   Substance and Sexual Activity    Alcohol use: No    Drug use: No     Social Drivers of Health     Food Insecurity: No Food Insecurity (07/07/2024)    Hunger Vital Sign     Worried About Running Out of Food in the Last Year: Never true     Ran Out of Food in the Last Year: Never true   Tobacco Use: Low Risk (07/12/2024)    Patient History     Smoking Tobacco Use: Never     Smokeless Tobacco Use: Never   Transportation Needs: No Transportation Needs (07/07/2024)    PRAPARE - Transportation     Lack of Transportation (Medical): No     Lack of Transportation (Non-Medical): No   Housing: Low Risk (07/07/2024)    Housing     Within the past 12 months, have you ever stayed: outside, in a car, in a tent, in an overnight shelter, or temporarily in someone else's home (i.e. couch-surfing)?: No     Are you worried about losing your housing?: No   Utilities: Low Risk (07/07/2024)    Utilities     Within the past 12 months, have you been unable to get utilities (heat, electricity) when it was really needed?: No   Interpersonal Safety: Not At Risk (07/11/2024)    Interpersonal Safety     Unsafe Where You Currently Live: No     Physically Hurt by Anyone: No     Abused by Anyone: No   Financial Resource Strain: Low Risk (07/07/2024)    Overall Financial Resource Strain (CARDIA)     Difficulty of Paying Living Expenses: Not hard at all   [5]   Current Facility-Administered Medications Ordered in Epic   Medication Dose Route Frequency Provider Last Rate Last Admin    acetaminophen  (TYLENOL ) tablet 650 mg  650 mg Oral Q6H PRN Vicci Chiquita Sat, ACNP        [Provider Hold] amlodipine  (NORVASC )  tablet 10 mg  10 mg Oral Daily Vicci Lacks [place], ACNP   10 mg at 07/11/24 1743    atorvastatin  (LIPITOR ) tablet 40 mg  40 mg Oral Daily Vicci Lacks [place], ACNP   40 mg at 07/14/24 9089    dexAMETHasone  (DECADRON ) tablet 4 mg  4 mg Oral Q6H SCH Gilbert, Olivia E, MD   4 mg at 07/14/24 1138    Followed by    NOREEN ON 07/15/2024] dexAMETHasone  (DECADRON ) tablet 2 mg  2 mg Oral Q6H SCH Gilbert, Olivia E, MD        Followed by    NOREEN ON 07/18/2024] dexAMETHasone  (DECADRON ) tablet 2 mg  2 mg Oral Q8H SCH Gilbert, Olivia E, MD        Followed by    NOREEN ON 07/20/2024] dexAMETHasone  (DECADRON ) tablet 2 mg  2 mg Oral Q12H SCH Gilbert, Olivia E, MD        Followed by    NOREEN ON 07/22/2024] dexAMETHasone  (DECADRON ) tablet 1 mg  1 mg Oral Q12H SCH Gilbert, Olivia E, MD        dextrose  50 % in water  (D50W) 50 % solution 12.5 g  12.5 g Intravenous Q15 Min PRN Vicci Lacks [place], ACNP        famotidine  (PEPCID ) tablet 20 mg  20 mg Oral BID Kennyth Darryle SAUNDERS, ACNP   20 mg at 07/14/24 0911    heparin  (porcine) 5,000 unit/mL injection 5,000 Units  5,000 Units Subcutaneous Q8H SCH Bonnetta Carleton Norris, ACNP   5,000 Units at 07/14/24 1347    hydrALAZINE  (APRESOLINE ) injection 10 mg  10 mg Intravenous Q4H PRN Bonnetta Carleton Norris, ACNP        insulin  lispro (HumaLOG ) inj PERCENTAGE MEAL EATEN 6 Units  6 Units Subcutaneous 3xd Meals Leotis Izetta Gibson, FNP   6 Units at 07/14/24 1345    insulin  lispro (HumaLOG ) injection CORRECTIONAL 0-20 Units  0-20 Units Subcutaneous ACHS Leotis, April Lynne, FNP   3 Units at 07/14/24 1138    insulin  NPH (HumuLIN ,NovoLIN ) injection 7 Units  7 Units Subcutaneous Q12H [facility] Goley, April Lynne, FNP   7 Units at 07/14/24 0910    labetalol (NORMODYNE) injection  10 mg Intravenous Q4H PRN Bonnetta Carleton Norris, ACNP        levETIRAcetam  (KEPPRA ) tablet 500 mg  500 mg Oral BID Kennyth Darryle SAUNDERS, ACNP   500 mg at 07/14/24 9088    melatonin tablet 3 mg  3 mg Oral Nightly PRN Rardin, Wanda, PA   3 mg at 07/13/24 0143    oxyCODONE  (ROXICODONE ) immediate release tablet 5 mg  5 mg Oral Q6H PRN Vicci Lacks Sat, ACNP        senna NALANI) tablet 2 tablet  2 tablet Oral Nightly Kennyth Darryle SAUNDERS, ACNP   2 tablet at 07/13/24 2049    tacrolimus  (ENVARSUS  XR) extended release tablet 5 mg  5 mg Oral Daily Vicci Lacks Sat, ACNP   5 mg at 07/14/24 9088    valsartan  (DIOVAN ) tablet 160 mg  160 mg Oral Daily Bonnetta Carleton Norris, ACNP   160 mg at 07/14/24 9089     No current Epic-ordered outpatient medications on file.   [6]   Medications Prior to Admission   Medication Sig Dispense Refill Last Dose/Taking    acetaminophen  (TYLENOL ) 500 MG tablet Take 1-2 tablets (500-1,000 mg total) by mouth every six (6) hours as needed for pain or fever (> 38C). 100 tablet 0 07/10/2024  allopurinol  (ZYLOPRIM ) 100 MG tablet Take 1 tablet (100 mg total) by mouth every evening. 90 tablet 3 07/10/2024    atorvastatin  (LIPITOR ) 40 MG tablet Take 1 tablet (40 mg total) by mouth daily. 90 tablet 3 07/10/2024    dexAMETHasone  (DECADRON ) 1 MG tablet Take 4 tablets (4 mg total) by mouth every twelve (12) hours for 3 days, THEN 4 tablets (4 mg total) daily for 3 days, THEN 2 tablets (2 mg total) daily for 3 days, THEN 1 tablet (1 mg total) daily for 3 days. 45 tablet 0 07/11/2024    ENVARSUS  XR 4 mg Tb24 extended release tablet Take 1 tablet (4 mg total) by mouth daily. Take in addition to ONE 1 mg tablets for a total daily dose of 5 mg. 90 tablet 3 07/10/2024    latanoprost (XALATAN) 0.005 % ophthalmic solution Administer 1 drop to both eyes nightly.   07/10/2024    magnesium  oxide-Mg AA chelate (MAGNESIUM , AMINO ACID CHELATE,) 133 mg Take 1 tablet by mouth two (2) times a day. 100 tablet 11 07/10/2024    metFORMIN  (GLUCOPHAGE -XR) 500 MG 24 hr tablet Take 1 tablet (500 mg total) by mouth in the morning. 90 tablet 3 07/10/2024    tacrolimus  (ENVARSUS  XR) 1 mg Tb24 extended release tablet Take 1 tablet (1 mg total) by mouth in the morning. Take one tablet daily with ONE 4 mg tablet for a total daily dose of 5mg .   07/10/2024    [Paused] amlodipine  (NORVASC ) 10 MG tablet Take 1 tablet (10 mg total) by mouth daily. (Patient not taking: Reported on 07/11/2024) 90 tablet 3 Not Taking    [EXPIRED] flu vacc 2025-26 (65 yr up) (PF)(FLUAD )45 mcg(15mcgx3)/0.5 ml IM syringe Inject 0.5 mL into the muscle once for 1 dose. 0.5 mL 0     [Paused] mycophenolate  (MYFORTIC ) 180 MG EC tablet Take 2 tablets (360 mg total) by mouth two (2) times a day. (Patient not taking: Reported on 07/11/2024) 180 tablet 3 Not Taking    [Paused] olmesartan  (BENICAR ) 40 MG tablet Take 1 tablet (40 mg total) by mouth daily. (Patient not taking: Reported on 07/11/2024) 90 tablet 3 Not Taking    [START ON 07/20/2024] predniSONE  (DELTASONE ) 5 MG tablet Take 1 tablet (5 mg total) by mouth daily. (Patient not taking: Reported on 07/11/2024) 90 tablet 3 Not Taking

## 2024-07-14 NOTE — Progress Notes (Signed)
 [facility]   INPATIENT PROGRESS NOTE      Brief History of Present Illness  Frank Leach is a 73 y.o. male with RCC/renal transplant now found to have a left frontal brain mass now s/p left craniotomy for resection (12/5).    Subjective/Interval History  Stable bradycardia. Glucose intermittently high, up to 283, most recent check 108.     Interval Imaging Reviewed  None    Neurological Assessment and Plan  **Frontal mass of brain  POD3 Left craniotomy for resection of brain mass (12/5)  - SBP < 160; Na > 135  - dex taper to 2mg  BID  - Keppra  500 BID x 7 days  - light precautions x48 hours - complete  - ok to continue tacrolimus   - HOB > 30  - PT/OT  - PM&R consult for [facility] eval    Anticoagulant/Antiplatelet needs: ASA- hold    Disposition: floor status    ___________________________________________________________________    Neurological Exam  EOSp  Ox3  PERRL  EOMI  FS  TML  RUE 4  LUE 5  RLE 4+  LLE 5    Incision c/d/I staples     The patient's vitals, intake/output, labs, orders, and relevant imaging were reviewed for the last 24 hours.    Problem List  Principal Problem:    Frontal mass of brain

## 2024-07-15 LAB — RENAL FUNCTION PANEL
ALBUMIN: 2.9 g/dL — ABNORMAL LOW (ref 3.4–5.0)
ANION GAP: 11 mmol/L (ref 5–14)
BLOOD UREA NITROGEN: 36 mg/dL — ABNORMAL HIGH (ref 9–23)
BUN / CREAT RATIO: 22
CALCIUM: 9.6 mg/dL (ref 8.7–10.4)
CHLORIDE: 106 mmol/L (ref 98–107)
CO2: 24 mmol/L (ref 20.0–31.0)
CREATININE: 1.66 mg/dL — ABNORMAL HIGH (ref 0.73–1.18)
EGFR CKD-EPI (2021) MALE: 44 mL/min/1.73m2 — ABNORMAL LOW (ref >=60)
GLUCOSE RANDOM: 251 mg/dL — ABNORMAL HIGH (ref 70–179)
PHOSPHORUS: 3.6 mg/dL (ref 2.4–5.1)
POTASSIUM: 4.2 mmol/L (ref 3.4–4.8)
SODIUM: 141 mmol/L (ref 135–145)

## 2024-07-15 LAB — CBC
HEMATOCRIT: 36.7 % — ABNORMAL LOW (ref 39.0–48.0)
HEMOGLOBIN: 11.9 g/dL — ABNORMAL LOW (ref 12.9–16.5)
MEAN CORPUSCULAR HEMOGLOBIN CONC: 32.5 g/dL (ref 32.0–36.0)
MEAN CORPUSCULAR HEMOGLOBIN: 29.1 pg (ref 25.9–32.4)
MEAN CORPUSCULAR VOLUME: 89.4 fL (ref 77.6–95.7)
MEAN PLATELET VOLUME: 8.6 fL (ref 6.8–10.7)
PLATELET COUNT: 239 10*9/L (ref 150–450)
RED BLOOD CELL COUNT: 4.1 10*12/L — ABNORMAL LOW (ref 4.26–5.60)
RED CELL DISTRIBUTION WIDTH: 15.4 % — ABNORMAL HIGH (ref 12.2–15.2)
WBC ADJUSTED: 19.1 10*9/L — ABNORMAL HIGH (ref 3.6–11.2)

## 2024-07-15 LAB — TACROLIMUS LEVEL, TROUGH: TACROLIMUS, TROUGH: 1.5 ng/mL — ABNORMAL LOW (ref 5.0–15.0)

## 2024-07-15 MED ADMIN — heparin (porcine) 5,000 unit/mL injection 5,000 Units: 5000 [IU] | SUBCUTANEOUS | @ 02:00:00

## 2024-07-15 MED ADMIN — heparin (porcine) 5,000 unit/mL injection 5,000 Units: 5000 [IU] | SUBCUTANEOUS | @ 11:00:00

## 2024-07-15 MED ADMIN — heparin (porcine) 5,000 unit/mL injection 5,000 Units: 5000 [IU] | SUBCUTANEOUS | @ 18:00:00

## 2024-07-15 MED ADMIN — dexAMETHasone (DECADRON) tablet 2 mg: 2 mg | ORAL | @ 23:00:00 | Stop: 2024-07-18

## 2024-07-15 MED ADMIN — dexAMETHasone (DECADRON) tablet 2 mg: 2 mg | ORAL | @ 18:00:00 | Stop: 2024-07-18

## 2024-07-15 MED ADMIN — senna (SENOKOT) tablet 2 tablet: 2 | ORAL | @ 02:00:00

## 2024-07-15 MED ADMIN — insulin lispro (HumaLOG) injection CORRECTIONAL 0-20 Units: 0-20 [IU] | SUBCUTANEOUS | @ 23:00:00

## 2024-07-15 MED ADMIN — insulin lispro (HumaLOG) injection CORRECTIONAL 0-20 Units: 0-20 [IU] | SUBCUTANEOUS | @ 12:00:00

## 2024-07-15 MED ADMIN — insulin lispro (HumaLOG) injection CORRECTIONAL 0-20 Units: 0-20 [IU] | SUBCUTANEOUS | @ 02:00:00

## 2024-07-15 MED ADMIN — dexAMETHasone (DECADRON) tablet 4 mg: 4 mg | ORAL | @ 05:00:00 | Stop: 2024-07-15

## 2024-07-15 MED ADMIN — dexAMETHasone (DECADRON) tablet 4 mg: 4 mg | ORAL | @ 11:00:00 | Stop: 2024-07-15

## 2024-07-15 MED ADMIN — tacrolimus (ENVARSUS XR) extended release tablet 5 mg: 5 mg | ORAL | @ 13:00:00 | Stop: 2024-07-15

## 2024-07-15 MED ADMIN — levETIRAcetam (KEPPRA) tablet 500 mg: 500 mg | ORAL | @ 02:00:00 | Stop: 2024-07-18

## 2024-07-15 MED ADMIN — levETIRAcetam (KEPPRA) tablet 500 mg: 500 mg | ORAL | @ 13:00:00 | Stop: 2024-07-18

## 2024-07-15 MED ADMIN — famotidine (PEPCID) tablet 20 mg: 20 mg | ORAL | @ 13:00:00 | Stop: 2024-07-15

## 2024-07-15 MED ADMIN — famotidine (PEPCID) tablet 20 mg: 20 mg | ORAL | @ 02:00:00

## 2024-07-15 MED ADMIN — atorvastatin (LIPITOR) tablet 40 mg: 40 mg | ORAL | @ 13:00:00

## 2024-07-15 MED ADMIN — insulin NPH (HumuLIN,NovoLIN) injection 7 Units: 7 [IU] | SUBCUTANEOUS | @ 13:00:00

## 2024-07-15 MED ADMIN — insulin NPH (HumuLIN,NovoLIN) injection 7 Units: 7 [IU] | SUBCUTANEOUS | @ 02:00:00

## 2024-07-15 MED ADMIN — valsartan (DIOVAN) tablet 160 mg: 160 mg | ORAL | @ 13:00:00

## 2024-07-15 MED ADMIN — insulin lispro (HumaLOG) inj PERCENTAGE MEAL EATEN 8 Units: 8 [IU] | SUBCUTANEOUS | @ 18:00:00

## 2024-07-15 MED ADMIN — insulin lispro (HumaLOG) inj PERCENTAGE MEAL EATEN 8 Units: 8 [IU] | SUBCUTANEOUS | @ 23:00:00

## 2024-07-15 MED ADMIN — insulin lispro (HumaLOG) inj PERCENTAGE MEAL EATEN 8 Units: 8 [IU] | SUBCUTANEOUS | @ 13:00:00

## 2024-07-15 NOTE — Plan of Care (Signed)
 Shift Summary  Correctional insulin  was administered after a high blood glucose reading of 305 mg/dL, and the provider was notified.  Family remained at bedside for support, and fall prevention measures were maintained throughout the shift.  Surgical site and IV sites remained clean, dry, and intact, with no interventions required for wound or line care.  No pain was reported, and the patient declined pain interventions during the shift.  The patient remained stable with no acute changes or complications during the shift. Vital signs stable. Adequate urine output.     Problem: Adult Inpatient Plan of Care  Goal: Optimal Comfort and Wellbeing  Outcome: Shift Focus     Problem: Fall Injury Risk  Goal: Absence of Fall and Fall-Related Injury  Outcome: Shift Focus  Intervention: Promote Injury-Free Environment  Recent Flowsheet Documentation  Taken 07/14/2024 2000 by Merilee Powell POUR, RN  Safety Interventions:   fall reduction program maintained   bed alarm   family at bedside   lighting adjusted for tasks/safety   low bed   nonskid shoes/slippers when out of bed     Problem: Skin Injury Risk Increased  Goal: Skin Health and Integrity  Outcome: Shift Focus     Problem: Wound  Goal: Optimal Coping  Outcome: Shift Focus  Goal: Optimal Pain Control and Function  Outcome: Shift Focus     Problem: Adult Inpatient Plan of Care  Goal: Absence of Hospital-Acquired Illness or Injury  Intervention: Identify and Manage Fall Risk  Recent Flowsheet Documentation  Taken 07/14/2024 2000 by Merilee Powell POUR, RN  Safety Interventions:   fall reduction program maintained   bed alarm   family at bedside   lighting adjusted for tasks/safety   low bed   nonskid shoes/slippers when out of bed

## 2024-07-15 NOTE — Consults (Signed)
 Transplant Nephrology Consult     Requesting Attending Physician :  Rosaria CHRISTELLA Lush, MD  Service Requesting Consult : Neurosurgery [facility])  Reason for Consult: kidney transplant recipient, assistance with immunosuppression management    Assessment and Plan:     # S/p Kidney Transplant, Kidney allograft function (stable):  - Serum creatinine level is 1.66, baseline of approximately 1.7-1.9.    # Immunosuppression:  - Off mycophenolate  in setting of likely malignancy   - Currently on Envarsus  5 mg daily  - Please obtain trough tacrolimus  trough levels prior to the morning dose of the medication and approximately 24hrs after prior dose. The goal tacrolimus  level is 4-6 ng/mL. His last three levels have not been within goal so recommend increasing Envarsus  to 6 mg daily.   - Once the patient has completed his course of dexamethasone , would start prednisone  5 mg daily.    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 The patient should be counseled on this at the time of discharge if they have not already been doing this.  - We will continue to follow.     Marcellus MARLA Kitty, MD  07/15/2024 11:12 AM     Medical decision-making for 07/15/24  Findings / Data     Patient has: []  acute illness w/systemic sxs  [mod]  []  two or more stable chronic illnesses [mod]  []  one chronic illness with acute exacerbation [mod]  []  acute complicated illness  [mod]  []  Undiagnosed new problem with uncertain prognosis  [mod] [x]  illness posing risk to life or bodily function (ex. AKI)  [high]  []  chronic illness with severe exacerbation/progression  [high]  []  chronic illness with severe side effects of treatment  [high] Kidney transplant recipient, brain mass Probs At least 2:  Probs, Data, Risk   I reviewed: []  primary team note  []  consultant note(s)  []  external records [x]  chemistry results  [x]  CBC results  []  blood gas results  []  Other []  procedure/op note(s)   [x]  radiology report(s)  []  micro result(s)  []  w/ independent historian(s) Cr at baseline, reviewed primary team notes >=3 Data Review (2 of 3)    I independently interpreted: []  Urine Sediment  []  Renal US  []  CXR Images  []  CT Images  []  Other []  EKG Tracing  Any     I discussed: []  Pathology results w/ QHPs(s) from other specialties  []  Procedural findings w/ QHPs(s) from other specialties []  Imaging w/ QHP(s) from other specialties  [x]  Treatment plan w/ QHP(s) from other specialties Plan discussed with primary team Any     Mgm't requires: []  Prescription drug(s)  [mod]  []  Kidney biopsy  [mod]  []  Central line placement  [mod] [x]  High risk medication use and/or intensive toxicity monitoring [high]  []  Renal replacement therapy [high]  []  High risk kidney biopsy  [high]  []  Escalation of care  [high]  []  High risk central line placement  [high] Immunosuppression: high risk for infection Risk      _____________________________________________________________________________________  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 Nephrology has been consulted for immunosuppression management.     Recently admitted for difficulty walking and found to have left frontal lobe mass. S/p left craniotomy for resection of brain mass on 12/5. Pending dispo to AIR.    Reports he is doing well this morning. Eating and drinking well. No questions or concerns.     INPATIENT MEDICATIONS:  Current Medications[1]    Physical Exam:   Vitals:    07/14/24 1457 07/14/24 1940 07/15/24 0530 07/15/24 0815   BP: 127/61 141/70 157/75 133/63   Pulse: 60 62 59 62   Resp: 17 16 16 16    Temp: 36.5 ??C (97.7 ??F) 36.2 ??C (97.2 ??F) 36.7 ??C (98.1 ??F) 36.5 ??C (97.7 ??F)   TempSrc: Temporal Temporal Temporal Temporal   SpO2: 97% 97% 98% 98%   Weight:       Height:         No intake/output data recorded.    Intake/Output Summary (Last 24 hours) at 07/15/2024 1112  Last data filed at 07/15/2024 0530  Gross per 24 hour   Intake 140 ml   Output 1150 ml   Net -1010 ml         Constitutional: no acute distress, resting in bed  Heart: regular rate and rhythm  Lungs: normal WOB, on room air  Abd: soft, non-distended  Ext: no lower extremity edema    Neuro: awake, alert           [1]   Current Facility-Administered Medications:     acetaminophen  (TYLENOL ) tablet 650 mg, Oral, Q6H PRN    [Provider Hold] amlodipine  (NORVASC ) tablet 10 mg, Oral, Daily    atorvastatin  (LIPITOR ) tablet 40 mg, Oral, Daily    [COMPLETED] dexAMETHasone  (DECADRON ) tablet 4 mg, Oral, Q6H SCH **FOLLOWED BY** dexAMETHasone  (DECADRON ) tablet 2 mg, Oral, Q6H SCH **FOLLOWED BY** [START ON 07/18/2024] dexAMETHasone  (DECADRON ) tablet 2 mg, Oral, Q8H SCH **FOLLOWED BY** [START ON 07/20/2024] dexAMETHasone  (DECADRON ) tablet 2 mg, Oral, Q12H SCH **FOLLOWED BY** [START ON 07/22/2024] dexAMETHasone  (DECADRON ) tablet 1 mg, Oral, Q12H SCH    dextrose  50 % in water  (D50W) 50 % solution 12.5 g, Intravenous, Q15 Min PRN    [START ON 07/16/2024] famotidine  (PEPCID ) tablet 20 mg, Oral, Daily    heparin  (porcine) 5,000 unit/mL injection 5,000 Units, Subcutaneous, Q8H SCH    hydrALAZINE  (APRESOLINE ) injection 10 mg, Intravenous, Q4H PRN    insulin  lispro (HumaLOG ) inj PERCENTAGE MEAL EATEN 8 Units, Subcutaneous, 3xd Meals    insulin  lispro (HumaLOG ) injection CORRECTIONAL 0-20 Units, Subcutaneous, ACHS    insulin  NPH (HumuLIN ,NovoLIN ) injection 7  Units, Subcutaneous, Q12H SCH    labetalol (NORMODYNE) injection, Intravenous, Q4H PRN    levETIRAcetam  (KEPPRA ) tablet 500 mg, Oral, BID    melatonin tablet 3 mg, Oral, Nightly PRN    senna (SENOKOT) tablet 2 tablet, Oral, Nightly    [START ON 07/16/2024] tacrolimus  (ENVARSUS  XR) extended release tablet 6 mg, Oral, Daily    valsartan  (DIOVAN ) tablet 160 mg, Oral, Daily

## 2024-07-15 NOTE — Consults (Signed)
 Endocrine Team Diabetes Follow Up Consult Note     Consult information:  Requesting Attending Physician : Frank CHRISTELLA Lush, MD  Service Requesting Consult : Neurosurgery [facility])  Primary Care Provider: Kandis Stefano Houston, MD  Impression:  Frank Leach is a 73 y.o. male admitted for brain lesion. We have been consulted at the request of Frank CHRISTELLA Lush, MD to evaluate Frank Leach for hyperglycemia.     Medical Decision Making:  Diagnoses:  1.Type 2 Diabetes. Uncontrolled With severe hyperglycemia last 24 hours.  2. Nutrition: Complicating glycemic control. Increasing risk for both hypoglycemia and hyperglycemia.  3. Steroids. Complicating glycemic control and increasing risk for hyperglycemia.  4. Transplant. Complicating glycemic control and increasing risk for hyperglycemia.  5. Chronic Kidney Disease. Complicating glycemic control and increasing risk for hypoglycemia.        Studies reviewed 07/15/24:  Labs: CBC, BMP, POCT-BG, and HbA1C  Interpretation: +Leukocytosis. Hyperkalemia. Elevated Cr in the setting of known CKD. Hyperglycemia with severe. A1C 6.8% indicates excellent control outpatient.  Notes reviewed: Primary team and nursing notes      Overall impression based on above reviews and history:  Hyperglycemia in patient with known type 2 diabetes complicated by steroids. Given trends will continue basal as is, but increase nutritional lispro dose.      Steroid taper:  12/7: dexamethasone  4mg  q6h  12/9: dexamethasone  2mg  q6h  12/12: dexamethasone  2mg  q8h  12/14: dexamethasone  2mg  q12h  12/16: dexamethasone  1mg  q12h    Recommendations:  - NPH 7u q12h  - lispro 8u qAC  - lispro target 140, ISF 20 achs  - Hypoglycemia protocol.  - POCT-BG achs.  - Ensure patient is on glucose precautions if patient taking nutrition by mouth.     Discharge planning:  In process. Will complete actual plan closer to discharge.   Anticipate discharging back on home regimen. May depend on steroids.     Thank you for this consult. Discussed plan with primary team. We will continue to follow and make recommendations and place orders as appropriate.    Please page with questions or concerns: Frank Zeleznik, NP: [phone]  DCT on call from 6AM - 3PM on weekdays then endocrine fellow on call: [phone] from 3PM - 6AM on weekdays and on weekends and holidays.   If APP cannot be reached, please page the endocrine fellow on call.      Subjective:  Interval history: Reports decent po intake. Denies complaints at time of rounds.     Initial HPI:  Frank Leach is a 73 y.o. 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     Diabetes History:  Patient has a history of Type 2 diabetes diagnosed at least 15+ years ago.  Diabetes is managed by: PCP, nephrologist.  Current home diabetes regimen: metformin  XR 500mg  qAM,empagliflozin  25mg  qam.  Current home blood glucose monitoring:  occasionally.  Hypoglycemia awareness: yes.  Complications related to diabetes: ESRD s/p transplant in 2020      Current Nutrition:  Active Orders   Diet    Nutrition Therapy Regular/House       ROS: As per HPI.    Scheduled Medications[1]    Current Outpatient Medications   Medication Instructions    acetaminophen  (TYLENOL ) 500-1,000 mg, Oral, Every 6 hours PRN  allopurinol  (ZYLOPRIM ) 100 mg, Oral, Every evening    [Paused] amlodipine  (NORVASC ) 10 mg, Oral, Daily (standard)    atorvastatin  (LIPITOR ) 40 mg, Oral, Daily (standard)    dexAMETHasone  (DECADRON ) 1 MG tablet Take 4 tablets (4 mg total) by mouth every twelve (12) hours for 3 days, THEN 4 tablets (4 mg total) daily for 3 days, THEN 2 tablets (2 mg total) daily for 3 days, THEN 1 tablet (1 mg total) daily for 3 days.    ENVARSUS  XR 4 mg, Oral, Daily (standard), Take in addition to ONE 1 mg tablets for a total daily dose of 5 mg.    latanoprost (XALATAN) 0.005 % ophthalmic solution 1 drop, Nightly    magnesium  oxide-Mg AA chelate (MAGNESIUM , AMINO ACID CHELATE,) 133 mg 1 tablet, Oral, 2 times a day    metFORMIN  (GLUCOPHAGE -XR) 500 mg, Oral, Daily (RT)    [Paused] mycophenolate  (MYFORTIC ) 360 mg, Oral, 2 times a day (standard)    [Paused] olmesartan  (BENICAR ) 40 mg, Oral, Daily (standard)    [START ON 07/20/2024] predniSONE  (DELTASONE ) 5 mg, Oral, Daily (standard)    tacrolimus  (ENVARSUS  XR) 1 mg Tb24 extended release tablet 1 tablet, Daily           Past Medical History[2]    Past Surgical History[3]    Family History[4]    Short Social History[5]    OBJECTIVE:  BP 157/75  - Pulse 59  - Temp 36.7 ??C (98.1 ??F) (Temporal)  - Resp 16  - Ht 170.2 cm (5' 7.01)  - Wt 73 kg (160 lb 15 oz)  - SpO2 98%  - BMI 25.20 kg/m??   Wt Readings from Last 12 Encounters:   07/13/24 73 kg (160 lb 15 oz)   07/03/24 72.6 kg (160 lb)   01/18/24 73.2 kg (161 lb 6.4 oz)   06/08/23 68.9 kg (151 lb 12.8 oz)   03/22/23 68.3 kg (150 lb 8 oz)   02/14/23 68.4 kg (150 lb 12.8 oz)   12/21/22 68 kg (149 lb 14.4 oz)   11/27/22 67.6 kg (149 lb)   11/02/22 68.1 kg (150 lb 3.2 oz)   10/13/22 68.2 kg (150 lb 6.4 oz)   06/02/22 67 kg (147 lb 9.6 oz)   02/08/22 67.4 kg (148 lb 9.6 oz)     Physical Exam  Vitals and nursing note reviewed.   Constitutional:       General: He is not in acute distress.     Appearance: Normal appearance.   Pulmonary:      Effort: Pulmonary effort is normal. No respiratory distress.   Skin:     General: Skin is warm and dry.   Neurological:      General: No focal deficit present.      Mental Status: He is alert and oriented to person, place, and time.   Psychiatric:         Mood and Affect: Mood normal.         Behavior: Behavior normal.         BG/insulin  reviewed per EMR.   Glucose, POC   Date Value   07/15/2024 150 mg/dL   87/91/7974 694 mg/dL (H)   87/91/7974 718 mg/dL (H)   87/91/7974 793 mg/dL (H)   87/91/7974 770 mg/dL (H)   87/91/7974 891 mg/dL   87/92/7974 738 mg/dL (H)   87/92/7974 716 mg/dL (H)   89/76/7986 784 MG/DL (H)   90/74/7986 55 MG/DL (L)   90/74/7986 52 MG/DL (L)   93/78/7986  96 MG/DL   93/78/7986 844 MG/DL   93/78/7986 877 MG/DL   93/79/7986 768 MG/DL (H)   93/79/7986 91 MG/DL        Summary of labs:  Lab Results   Component Value Date    A1C 6.8 (H) 07/03/2024    A1C 6.2 (H) 01/07/2024    A1C 6.0 (H) 09/10/2023     Lab Results   Component Value Date    GFR 5.11 (L) 05/01/2012    CREATININE 1.75 (H) 07/14/2024     Lab Results   Component Value Date    WBC 19.0 (H) 07/13/2024    HGB 11.9 (L) 07/13/2024    HCT 36.9 (L) 07/13/2024    PLT 212 07/13/2024       Lab Results   Component Value Date    NA 142 07/14/2024    K 5.0 (H) 07/14/2024    CL 108 (H) 07/14/2024    CO2 25.0 07/14/2024    BUN 30 (H) 07/14/2024    CREATININE 1.75 (H) 07/14/2024    GLU 242 (H) 07/14/2024    CALCIUM 9.8 07/14/2024    MG 2.0 07/14/2024    PHOS 3.7 07/14/2024       Lab Results   Component Value Date    BILITOT 0.2 (L) 07/03/2024    BILIDIR 0.20 12/10/2023    PROT 7.2 07/03/2024    ALBUMIN 3.3 (L) 07/13/2024    ALT 17 07/03/2024    AST 20 07/03/2024    ALKPHOS 78 07/03/2024    GGT 20 06/21/2019                       [1]    [Provider Hold] amlodipine   10 mg Oral Daily    atorvastatin   40 mg Oral Daily    dexAMETHasone   2 mg Oral Q6H SCH    Followed by    NOREEN ON 07/18/2024] dexAMETHasone   2 mg Oral Q8H SCH    Followed by    NOREEN ON 07/20/2024] dexAMETHasone   2 mg Oral Q12H SCH    Followed by    NOREEN ON 07/22/2024] dexAMETHasone   1 mg Oral Q12H SCH    famotidine   20 mg Oral BID    heparin  (porcine) for subcutaneous use  5,000 Units Subcutaneous Q8H SCH    insulin  lispro  6 Units Subcutaneous 3xd Meals    insulin  lispro  0-20 Units Subcutaneous ACHS    insulin  NPH  7 Units Subcutaneous Q12H [facility]    levETIRAcetam   500 mg Oral BID    senna  2 tablet Oral Nightly    tacrolimus   5 mg Oral Daily    valsartan   160 mg Oral Daily   [2]   Past Medical History:  Diagnosis Date    Anemia     Cancer    (CMS-HCC)     clear cell adenocarcinoma of left kidney    Diabetes mellitus (CMS-HCC)     Diabetic nephropathy (CMS-HCC)     ESRD (end stage renal disease) on dialysis    (CMS-HCC)     ESRD on dialysis (CMS-HCC)     Gout     Hyperlipidemia     Hypertension    [3]   Past Surgical History:  Procedure Laterality Date    AV FISTULA PLACEMENT      PR COLONOSCOPY FLX DX W/COLLJ SPEC WHEN PFRMD N/A 04/30/2018    Procedure: COLONOSCOPY, FLEXIBLE, PROXIMAL TO SPLENIC FLEXURE; DIAGNOSTIC, W/WO COLLECTION SPECIMEN BY BRUSH OR  [facility];  Surgeon: Dorn Lynwood Lauth, MD;  Location[facility] PROCED[facility];  Service: Gastroenterology    PR COLONOSCOPY W/BIOPSY SINGLE/MULTIPLE N/A 04/30/2018    Procedure: COLONOSCOPY, FLEXIBLE, PROXIMAL TO SPLENIC FLEXURE; WITH BIOPSY, SINGLE OR MULTIPLE;  Surgeon: Dorn Lynwood Lauth, MD;  Location: [facility] PROCEDURES [facility];  Service: Gastroenterology    PR COLSC FLX W/RMVL OF TUMOR POLYP LESION SNARE TQ N/A 04/30/2018    Procedure: COLONOSCOPY FLEX; W/REMOV TUMOR/LES BY SNARE;  Surgeon: Dorn Lynwood Lauth, MD;  Location: [facility] GI PROCEDURES [facility];  Service: Gastroenterology    PR EXPLORATORY OF ABDOMEN Midline 04/14/2016    Procedure: EXPLORATORY LAPAROTOMY, EXPLORATORY CELIOTOMY WITH OR WITHOUT BIOPSY(S);  Surgeon: Marsa Sam Boss, MD;  Location: MAIN OR [facility];  Service: Transplant    PR EXPLORATORY OF ABDOMEN N/A 04/16/2016    Procedure: EXPLORATORY LAPAROTOMY, EXPLORATORY CELIOTOMY WITH OR WITHOUT BIOPSY(S);  Surgeon: Marsa Sam Boss, MD;  Location: MAIN OR [facility];  Service: Transplant    PR LAP, RADICAL NEPHRECTOMY Right 12/27/2016    Procedure: Robotic Xi Laparoscopy; Radical Nephrectomy (Incl Remove Gerota`S Fascia, Fatty Tissue, Reg Lymph Node, Adrenalectomy);  Surgeon: Clorinda Ole Chalk, MD;  Location: MAIN OR [facility];  Service: Urology    PR LIGATN ANGIOACCESS AV FISTULA Left 11/29/2022    Procedure: LIGATION OR BANDING OF ANGIOACCESS ARTERIOVENOUS FISTULA;  Surgeon: Toledo, Marsa Sam, MD;  Location: MAIN OR [place];  Service: Transplant    PR NEGATIVE PRESSURE WOUND THERAPY DME >50 SQ CM N/A 04/12/2016    Procedure: NEG PRESS WOUND TX (VAC ASSIST) INCL TOPICALS, PER SESSION, TSA GREATER THAN/= 50 CM SQUARED;  Surgeon: Marsa Sam Boss, MD;  Location: MAIN OR [place];  Service: Transplant    PR NEGATIVE PRESSURE WOUND THERAPY DME >50 SQ CM Bilateral 04/14/2016    Procedure: NEG PRESS WOUND TX (VAC ASSIST) INCL TOPICALS, PER SESSION, TSA GREATER THAN/= 50 CM SQUARED;  Surgeon: Marsa Sam Boss, MD;  Location: MAIN OR ;  Service: Transplant    PR NEGATIVE PRESSURE WOUND THERAPY DME >50 SQ CM N/A 04/16/2016    Procedure: NEG PRESS WOUND TX (VAC ASSIST) INCL TOPICALS, PER SESSION, TSA GREATER THAN/= 50 CM SQUARED;  Surgeon: Marsa Sam Boss, MD;  Location: MAIN OR [facility];  Service: Transplant    PR NEPHRECTOMY, W/PART. URETECTOMY Bilateral 04/11/2016    Procedure: LAPAROSCOPY, SURGICAL, NEPHRECTOMY WITH TOTAL URETERECTOMY;  Surgeon: Marsa Sam Boss, MD;  Location: MAIN OR [facility];  Service: Transplant    PR REMV KIDNEY,W/RIB RESECTION Bilateral 04/11/2016    Procedure: NEPHRECTOMY, INCLUDING PARTIAL URETERECTOMY, ANY OPEN APPROACH INCLUDING RIB RESECTION;  Surgeon: Marsa Sam Boss, MD;  Location: MAIN OR [facility];  Service: Transplant    PR TRANSPLANT,PREP LIVING  RENAL GRAFT N/A 06/22/2019    Procedure: BACKBENCH STD PREP LIVING DONOR RENAL ALLGRFT (OPEN/LAPROSC) PRIOR TO TRANSPLANT, INC DISSECT/REM AS NECESS;  Surgeon: Marsa Sam Boss, MD;  Location: MAIN OR [facility];  Service: Transplant    PR TRANSPLANTATION OF KIDNEY N/A 06/22/2019    Procedure: RENAL ALLOTRANSPLANTATION, IMPLANTATION OF GRAFT; WITHOUT RECIPIENT NEPHRECTOMY;  Surgeon: Marsa Sam Boss, MD;  Location: MAIN OR [facility];  Service: Transplant    SPLENECTOMY     [4]   Family History  Problem Relation Age of Onset    Kidney disease Mother     Cancer Father     Kidney disease Brother    [5]   Social History  Tobacco Use    Smoking status: Never    Smokeless tobacco: Never  Vaping Use    Vaping status: Never Used   Substance Use Topics    Alcohol use: No    Drug use: No

## 2024-07-15 NOTE — Consults (Signed)
 Tacrolimus  Therapeutic Monitoring Pharmacy Note    Frank Leach is a 73 y.o. male continuing tacrolimus .     Indication: Kidney transplant     Date of Transplant: 06/22/2019      Prior Dosing Information: Home regimen Envarsus  5 mg PO daily     Source(s) of information used to determine prior to admission dosing: Patient/Caregiver, Home Medication List, or Clinic Note    Goals:  Therapeutic Drug Levels  Tacrolimus  trough goal: 4-6 ng/mL    Additional Clinical Monitoring/Outcomes  Monitor renal function (SCr and urine output) and liver function (LFTs)  Monitor for signs/symptoms of adverse events (e.g., hyperglycemia, hyperkalemia, hypomagnesemia, hypertension, headache, tremor)    Previous Lab Results:  Tacrolimus , Trough   Date Value Ref Range Status   07/15/2024 1.5 (L) 5.0 - 15.0 ng/mL Final   07/14/2024 1.7 (L) 5.0 - 15.0 ng/mL Final   07/13/2024 1.5 (L) 5.0 - 15.0 ng/mL Final   07/12/2024 1.5 (L) 5.0 - 15.0 ng/mL Final   07/05/2024 5.1 5.0 - 15.0 ng/mL Final     Tacrolimus , Timed   Date Value Ref Range Status   07/07/2024 3.9 ng/mL Final     Creatinine   Date Value Ref Range Status   07/15/2024 1.66 (H) 0.73 - 1.18 mg/dL Final   87/91/7974 8.24 (H) 0.73 - 1.18 mg/dL Final   87/92/7974 8.42 (H) 0.73 - 1.18 mg/dL Final        Result:  87/0: 1.5 ng/mL @0840     Pharmacokinetic Considerations and Significant Drug Interactions:  Concurrent CYP3A4 substrates/inhibitors: amlodipine     Assessment/Plan:  Recommendation(s)  Numerous levels subtherapeutic. Agree with plan to Increase to tacrolimus  (Envarsus  XR) to 6mg  daily       Follow-up  Daily levels have been ordered at 0600.   A pharmacist will continue to monitor and recommend levels as appropriate    Please page service pharmacist with questions/clarifications.    Vernell Cumming, PharmD

## 2024-07-15 NOTE — Plan of Care (Signed)
 Shift Summary  Insulin  lispro was administered twice during the shift to address elevated glucose levels.   dexAMETHasone  was given in the afternoon.   Renal function panel and CBC were collected, revealing some abnormal values.   Tacrolimus  trough level was drawn and found to be low.   Overall, comfort was maintained, functional ability was stable, and readiness for transition of care was supported by consistent IRF candidacy and cognition.     Optimal Comfort and Wellbeing: Pain remained at 0 throughout the shift and no intervention was required; surgical sites were clean, dry, and intact with no odor or peri-wound issues noted.     Readiness for Transition of Care: IRF Candidacy Score was stable at 85 for the entire shift, and cognition remained appropriate with consistent ability to follow commands.     Optimal Functional Ability: Full movement was maintained in the left lower extremity, but right foot dorsiflexion and plantar flexion stayed weak throughout the shift.

## 2024-07-15 NOTE — Progress Notes (Signed)
 [facility]   INPATIENT PROGRESS NOTE      Brief History of Present Illness  Frank Leach is a 73 y.o. male with RCC/renal transplant now found to have a left frontal brain mass now s/p left craniotomy for resection (12/5).    Subjective/Interval History  Glucose high ovenight, 305. AIR referrals sent, pending rehab when able    Interval Imaging Reviewed  None    Neurological Assessment and Plan  **Frontal mass of brain  POD4 Left craniotomy for resection of brain mass (12/5)  - SBP < 160; Na > 135  - dex taper to 2mg  BID  - Keppra  500 BID x 7 days  - light precautions x48 hours - complete  - ok to continue tacrolimus   - HOB > 30  - PT/OT  - PM&R consult for AIR eval    Anticoagulant/Antiplatelet needs: ASA- hold    Disposition: floor status    ___________________________________________________________________    Neurological Exam  EOSp  Ox3  PERRL  EOMI  FS  TML  RUE 4  LUE 5  RLE 4+  LLE 5    Incision c/d/I staples     The patient's vitals, intake/output, labs, orders, and relevant imaging were reviewed for the last 24 hours.    Problem List  Principal Problem:    Frontal mass of brain

## 2024-07-16 LAB — TACROLIMUS LEVEL, TROUGH: TACROLIMUS, TROUGH: 2.1 ng/mL — ABNORMAL LOW (ref 5.0–15.0)

## 2024-07-16 MED ADMIN — tacrolimus (ENVARSUS XR) extended release tablet 6 mg: 6 mg | ORAL | @ 15:00:00

## 2024-07-16 MED ADMIN — heparin (porcine) 5,000 unit/mL injection 5,000 Units: 5000 [IU] | SUBCUTANEOUS | @ 11:00:00

## 2024-07-16 MED ADMIN — heparin (porcine) 5,000 unit/mL injection 5,000 Units: 5000 [IU] | SUBCUTANEOUS | @ 02:00:00

## 2024-07-16 MED ADMIN — heparin (porcine) 5,000 unit/mL injection 5,000 Units: 5000 [IU] | SUBCUTANEOUS | @ 20:00:00

## 2024-07-16 MED ADMIN — famotidine (PEPCID) tablet 20 mg: 20 mg | ORAL | @ 15:00:00

## 2024-07-16 MED ADMIN — dexAMETHasone (DECADRON) tablet 2 mg: 2 mg | ORAL | @ 17:00:00 | Stop: 2024-07-18

## 2024-07-16 MED ADMIN — dexAMETHasone (DECADRON) tablet 2 mg: 2 mg | ORAL | @ 22:00:00 | Stop: 2024-07-18

## 2024-07-16 MED ADMIN — dexAMETHasone (DECADRON) tablet 2 mg: 2 mg | ORAL | @ 11:00:00 | Stop: 2024-07-18

## 2024-07-16 MED ADMIN — dexAMETHasone (DECADRON) tablet 2 mg: 2 mg | ORAL | @ 05:00:00 | Stop: 2024-07-18

## 2024-07-16 MED ADMIN — senna (SENOKOT) tablet 2 tablet: 2 | ORAL | @ 02:00:00

## 2024-07-16 MED ADMIN — insulin lispro (HumaLOG) injection CORRECTIONAL 0-20 Units: 0-20 [IU] | SUBCUTANEOUS | @ 02:00:00

## 2024-07-16 MED ADMIN — insulin lispro (HumaLOG) injection CORRECTIONAL 0-20 Units: 0-20 [IU] | SUBCUTANEOUS | @ 21:00:00

## 2024-07-16 MED ADMIN — insulin lispro (HumaLOG) injection CORRECTIONAL 0-20 Units: 0-20 [IU] | SUBCUTANEOUS | @ 17:00:00

## 2024-07-16 MED ADMIN — levETIRAcetam (KEPPRA) tablet 500 mg: 500 mg | ORAL | @ 02:00:00 | Stop: 2024-07-18

## 2024-07-16 MED ADMIN — levETIRAcetam (KEPPRA) tablet 500 mg: 500 mg | ORAL | @ 15:00:00 | Stop: 2024-07-18

## 2024-07-16 MED ADMIN — atorvastatin (LIPITOR) tablet 40 mg: 40 mg | ORAL | @ 15:00:00

## 2024-07-16 MED ADMIN — insulin NPH (HumuLIN,NovoLIN) injection 7 Units: 7 [IU] | SUBCUTANEOUS | @ 02:00:00

## 2024-07-16 MED ADMIN — insulin NPH (HumuLIN,NovoLIN) injection 7 Units: 7 [IU] | SUBCUTANEOUS | @ 17:00:00

## 2024-07-16 MED ADMIN — valsartan (DIOVAN) tablet 160 mg: 160 mg | ORAL | @ 15:00:00

## 2024-07-16 MED ADMIN — insulin lispro (HumaLOG) inj PERCENTAGE MEAL EATEN 8 Units: 8 [IU] | SUBCUTANEOUS | @ 22:00:00

## 2024-07-16 MED ADMIN — insulin lispro (HumaLOG) inj PERCENTAGE MEAL EATEN 8 Units: 8 [IU] | SUBCUTANEOUS | @ 18:00:00

## 2024-07-16 MED ADMIN — insulin lispro (HumaLOG) inj PERCENTAGE MEAL EATEN 8 Units: 8 [IU] | SUBCUTANEOUS | @ 12:00:00

## 2024-07-16 NOTE — Progress Notes (Signed)
 [facility] - Initial Consult Note    Consulting Team: NSGY  Referring Physician: Rosaria Lush, MD  Date: 07/16/24    Patient Name: Frank Leach  MRN: [id number]   Primary Cancer: Brain  Reason(s) for Consult: Care coordination    Brief Assessment:   73 yo w/ RCC and bilateral nephrectomy with renal transplant found to have frontoparietal lesion s/p complete resection per operative note on 07/11/24 with Frank Leach. 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 Systemic staging notable for mass(es) in bladder wall c/f neoplasm and recommended for cysto in the outpatient setting.    Recommendations:   Cancer treatment:  We had a detailed discussion with Frank Leach and his family regarding the role of radiation therapy in the adjuvant treatment of Glioblastoma.    We recommend treatment with radiation therapy and concurrent chemotherapy with adjuvant and concurrent temozolomide by the guidance of Dr. Denis of neurooncology.  We discussed a three week course of radiation given his age and performance status. We discussed the logistics, timing, risks, benefits, and side effects associated with radiation therapy.  Potential risks and side effects include but are not limited to fatigue, headache, nausea/vomiting, hair loss, neurologic injury, and radionecrosis.  He would like to proceed with radiation with our team and treatment at our [facility] with Dr. Keane (which is closer to home) and signed written informed consent. We will proceed with simulation.   Radiation planning:  A CT simulation will be performed in our clinic, likely this week to begin the planning process for the patient's radiation therapy.   We plan to treat to a dose of 40.05 Gy in 15 fractions daily M-F.      Pathology: molecular status pending        Patient was seen and case was discussed with the attending, Dr. Arzella, who helped formulate the documented assessment and plan.     Frank Bald, MD PGY4  Resident Physician  Department of Radiation Oncology   [facility]       Please page the Inpatient/Urgent Radiation Oncology Consult Team Monday-Friday, 8a-4p with questions. For after hours or weekend emergencies, please page the on call resident.    ----  Pertinent history, including data review:  07/03/24 CT Head w/o showed vasogenic edema within the L frontal lobe c/f lesion. No midline shift or hemorrhage noted.  07/04/24 MRI Brain showed peripherally enhancing mass within L frontal lobe involving subcortical white matter and cortex, extending from vertex to the superior wall of the L lateral ventricle measuring ~4.4 cm. Also with associated vasogenic edema and restricted diffusion. Small R mastoid effusion also noted. CT Chest showed no evidence of metastatic disease. 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  07/11/24 resection with Frank Leach, complete at the point of resection  07/11/24 MRI brain post op w/ post op changes, blood products, and possible residual disease inferiorly         Prior Radiation Therapy:  No.    Pacemaker:  No.    Pregnancy status:  No; male patient    PAST MEDICAL HISTORY:  Past Medical History[1]    FAMILY HISTORY:  Family History[2]    SOCIAL HISTORY:   Social History [3]    Allergies:  Allergies[4]    Pertinent Medications:  Current Medications[5]    Physical Examination  ECOG: 2 = Ambulatory and capable of all selfcare but unable to carry out any work activities. Up and about more than 50% of waking hours  Pertinent Physical Exam:  General:  No acute distress. Alert and oriented X4.   HEENT: Bilateral conjunctivae pink and without lesions.  Craniotomy incision clean and dry.   Neck/lymphatics:  Supple.  Cardiovascular:  Extremities warm and well perfused.  Respiratory:  Breathing is non-labored.  Extremities:  No cyanosis or edema.  Neuro:   Alert and oriented x4. Speech is fluent with no expressive aphasia, comprehension is full. Extraocular movements are intact. Eyebrow elevation, smile, and palatal elevation are symmetric. CN II-XII grossly intact. Strength 4/5 in right upper and lower extremity and 5/5 in left upper and lower extremity.  Skin:   Warm and dry.           [1]   Past Medical History:  Diagnosis Date    Anemia     Cancer    (CMS-HCC)     clear cell adenocarcinoma of left kidney    Diabetes mellitus (CMS-HCC)     Diabetic nephropathy (CMS-HCC)     ESRD (end stage renal disease) on dialysis    (CMS-HCC)     ESRD on dialysis (CMS-HCC)     Gout     Hyperlipidemia     Hypertension    [2]   Family History  Problem Relation Age of Onset    Kidney disease Mother     Cancer Father     Kidney disease Brother    [3]   Social History  Socioeconomic History    Marital status: Married     Spouse name: Frank Leach    Number of children: 1    Highest education level: High school graduate   Tobacco Use    Smoking status: Never    Smokeless tobacco: Never   Vaping Use    Vaping status: Never Used   Substance and Sexual Activity    Alcohol use: No    Drug use: No     Social Drivers of Health     Food Insecurity: No Food Insecurity (07/07/2024)    Hunger Vital Sign     Worried About Running Out of Food in the Last Year: Never true     Ran Out of Food in the Last Year: Never true   Tobacco Use: Low Risk (07/12/2024)    Patient History     Smoking Tobacco Use: Never     Smokeless Tobacco Use: Never   Transportation Needs: No Transportation Needs (07/07/2024)    PRAPARE - Therapist, Art (Medical): No     Lack of Transportation (Non-Medical): No   Housing: Low Risk (07/07/2024)    Housing     Within the past 12 months, have you ever stayed: outside, in a car, in a tent, in an overnight shelter, or temporarily in someone else's home (i.e. couch-surfing)?: No     Are you worried about losing your housing?: No   Utilities: Low Risk (07/07/2024)    Utilities     Within the past 12 months, have you been unable to get utilities (heat, electricity) when it was really needed?: No   Interpersonal Safety: Not At Risk (07/11/2024)    Interpersonal Safety     Unsafe Where You Currently Live: No     Physically Hurt by Anyone: No     Abused by Anyone: No   Financial Resource Strain: Low Risk (  07/07/2024)    Overall Financial Resource Strain (CARDIA)     Difficulty of Paying Living Expenses: Not hard at all   [4] No Known Allergies  [5]   Current Facility-Administered Medications   Medication Dose Route Frequency Provider Last Rate Last Admin    acetaminophen  (TYLENOL ) tablet 650 mg  650 mg Oral Q6H PRN Vicci Chiquita Sat, ACNP        [Provider Hold] amlodipine  (NORVASC ) tablet 10 mg  10 mg Oral Daily Vicci Chiquita Dimsdale, ACNP   10 mg at 07/11/24 1743    atorvastatin  (LIPITOR ) tablet 40 mg  40 mg Oral Daily Vicci Chiquita Sat, ACNP   40 mg at 07/16/24 0933    dexAMETHasone  (DECADRON ) tablet 2 mg  2 mg Oral Q6H SCH Gilbert, Olivia E, MD   2 mg at 07/16/24 1132    Followed by    NOREEN ON 07/18/2024] dexAMETHasone  (DECADRON ) tablet 2 mg  2 mg Oral Q8H SCH Gilbert, Olivia E, MD        Followed by    NOREEN ON 07/20/2024] dexAMETHasone  (DECADRON ) tablet 2 mg  2 mg Oral Q12H SCH Gilbert, Olivia E, MD        Followed by    NOREEN ON 07/22/2024] dexAMETHasone  (DECADRON ) tablet 1 mg  1 mg Oral Q12H SCH Gilbert, Olivia E, MD        dextrose  50 % in water  (D50W) 50 % solution 12.5 g  12.5 g Intravenous Q15 Min PRN Vicci Chiquita Dim[place], ACNP        famotidine  (PEPCID ) tablet 20 mg  20 mg Oral Daily Richarda, Megan R, ACNP   20 mg at 07/16/24 0933    heparin  (porcine) 5,000 unit/mL injection 5,000 Units  5,000 Units Subcutaneous Q8H SCH Bonnetta Carleton Norris, ACNP   5,000 Units at 07/16/24 1454    hydrALAZINE  (APRESOLINE ) injection 10 mg  10 mg Intravenous Q4H PRN Bonnetta Carleton Norris, ACNP        insulin  lispro (HumaLOG ) inj PERCENTAGE MEAL EATEN 8 Units  8 Units Subcutaneous 3xd Meals Leotis Izetta Gibson, [place]   4 Units at 07/16/24 1242    insulin  lispro (HumaLOG ) injection CORRECTIONAL 0-20 Units  0-20 Units Subcutaneous ACHS Leotis, April Lynne, FNP   2 Units at 07/16/24 1619    insulin  NPH (HumuLIN ,NovoLIN ) injection 7 Units  7 Units Subcutaneous Q12H [facility] Goley, April Lynne, FNP   7 Units at 07/16/24 1156    labetalol (NORMODYNE) injection  10 mg Intravenous Q4H PRN Bonnetta Carleton Norris, ACNP        levETIRAcetam  (KEPPRA ) tablet 500 mg  500 mg Oral BID Parker, Wesley R, ACNP   500 mg at 07/16/24 9065    melatonin tablet 3 mg  3 mg Oral Nightly PRN Rardin, Wanda, PA   3 mg at 07/13/24 0143    senna (SENOKOT) tablet 2 tablet  2 tablet Oral Nightly Kennyth Darryle SAUNDERS, ACNP   2 tablet at 07/15/24 2112    tacrolimus  (ENVARSUS  XR) extended release tablet 6 mg  6 mg Oral Daily Karry Wells HERO, MD   6 mg at 07/16/24 9065    valsartan  (DIOVAN ) tablet 160 mg  160 mg Oral Daily Bonnetta Carleton Norris, ACNP   160 mg at 07/16/24 9065

## 2024-07-16 NOTE — Consults (Signed)
 Transplant Nephrology Consult     Requesting Attending Physician :  Rosaria CHRISTELLA Lush, MD  Service Requesting Consult : Neurosurgery [facility])  Reason for Consult: kidney transplant recipient, assistance with immunosuppression management    Assessment and Plan:     # S/p Kidney Transplant, Kidney allograft function (stable):  - Serum creatinine level is pending for today but most recent was 1.66 (12/9), baseline of approximately 1.7-1.9.    # Immunosuppression:  - Off mycophenolate  in setting of likely malignancy   - Increased to Envarsus  6 mg daily (i12/10) in setting of lower tac troughs  - Please obtain trough tacrolimus  trough levels prior to the morning dose of the medication and approximately 24hrs after prior dose. The goal tacrolimus  level is 4-6 ng/mL.   - Once the patient has completed his course of dexamethasone , would start prednisone  5 mg daily.    # Blood Pressure / Volume:  - Relatively normotensive  - Currently on valsartan  160 mg daily    # Bladder mass  - Plan for outpatient cystoscopy with urology    # Frontal mass of brain s/p left craniotomy for resection of brain mass 12/5  - Management per primary team    RECOMMENDATIONS:   - No changes to current immunosuppression  - Transplant patients with an open wound require wound care with sterile water  only. The patient should be counseled on this at the time of discharge if they have not already been doing this.  - We will continue to follow.     Marcellus MARLA Kitty, MD  07/16/2024 10:00 AM     Medical decision-making for 07/16/24  Findings / Data     Patient has: []  acute illness w/systemic sxs  [mod]  []  two or more stable chronic illnesses [mod]  []  one chronic illness with acute exacerbation [mod]  []  acute complicated illness  [mod]  []  Undiagnosed new problem with uncertain prognosis  [mod] [x]  illness posing risk to life or bodily function (ex. AKI)  [high]  []  chronic illness with severe exacerbation/progression  [high]  []  chronic illness with severe side effects of treatment  [high] Kidney transplant recipient, brain mass Probs At least 2:  Probs, Data, Risk   I reviewed: []  primary team note  []  consultant note(s)  []  external records [x]  chemistry results  [x]  CBC results  []  blood gas results  []  Other []  procedure/op note(s)   [x]  radiology report(s)  []  micro result(s)  []  w/ independent historian(s) Cr at baseline, reviewed primary team notes > labs for today pending >=3 Data Review (2 of 3)    I independently interpreted: []  Urine Sediment  []  Renal US  []  CXR Images  []  CT Images  []  Other []  EKG Tracing  Any     I discussed: []  Pathology results w/ QHPs(s) from other specialties  []  Procedural findings w/ QHPs(s) from other specialties []  Imaging w/ QHP(s) from other specialties  [x]  Treatment plan w/ QHP(s) from other specialties Plan discussed with primary team Any     Mgm't requires: []  Prescription drug(s)  [mod]  []  Kidney biopsy  [mod]  []  Central line placement  [mod] [x]  High risk medication use and/or intensive toxicity monitoring [high]  []  Renal replacement therapy [high]  []  High risk kidney biopsy  [high]  []  Escalation of care  [high]  []  High risk central line placement  [high] Immunosuppression: high risk for infection Risk      _____________________________________________________________________________________      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 04/2016 and right 12/2016)  Post-Transplant Course: HD once for hyperkalemia early after transplant  Prior Transplants: none  Induction: alemtuzumab  Date of Ureteral Stent Removal: 07/29/2019  CMV and EBV Serologies: CMV D+/R+, EBV D+/R  Rejection Episodes: 01/2022 kidney biopsy showed mild focal tubulitis, patien twas treated with solumedrol 125 mg IV x3 and steroid taper  Donor Specific Antibodies: none    Interval events: No acute events overnight. Eating and drinking well. Sleeping well. Awaiting AIR.     INPATIENT MEDICATIONS:  Current Medications[1]    Physical Exam:   Vitals:    07/15/24 1923 07/16/24 0400 07/16/24 0830 07/16/24 0934   BP: 134/67 135/71 129/61 132/60   Pulse: 51 54 53    Resp: 17 18 18     Temp: 36.5 ??C (97.7 ??F) 36 ??C (96.8 ??F) 36.4 ??C (97.5 ??F)    TempSrc: Temporal Temporal Oral    SpO2: 95% 98% 96%    Weight:       Height:         No intake/output data recorded.    Intake/Output Summary (Last 24 hours) at 07/16/2024 1000  Last data filed at 07/16/2024 0400  Gross per 24 hour   Intake 240 ml   Output 750 ml   Net -510 ml         Constitutional: no acute distress, resting in bed, wife at bedside  Heart: regular rate and rhythm  Lungs: normal WOB, on room air  Abd: soft, non-distended  Ext: no lower extremity edema    Neuro: awake, alert             [1]   Current Facility-Administered Medications:     acetaminophen  (TYLENOL ) tablet 650 mg, Oral, Q6H PRN    [Provider Hold] amlodipine  (NORVASC ) tablet 10 mg, Oral, Daily    atorvastatin  (LIPITOR ) tablet 40 mg, Oral, Daily    [COMPLETED] dexAMETHasone  (DECADRON ) tablet 4 mg, Oral, Q6H SCH **FOLLOWED BY** dexAMETHasone  (DECADRON ) tablet 2 mg, Oral, Q6H SCH **FOLLOWED BY** [START ON 07/18/2024] dexAMETHasone  (DECADRON ) tablet 2 mg, Oral, Q8H SCH **FOLLOWED BY** [START ON 07/20/2024] dexAMETHasone  (DECADRON ) tablet 2 mg, Oral, Q12H SCH **FOLLOWED BY** [START ON 07/22/2024] dexAMETHasone  (DECADRON ) tablet 1 mg, Oral, Q12H [facility]    dextrose  50 % in water  (D50W) 50 % solution 12.5 g, Intravenous, Q15 Min PRN    famotidine  (PEPCID ) tablet 20 mg, Oral, Daily    heparin  (porcine) 5,000 unit/mL injection 5,000 Units, Subcutaneous, Q8H SCH    hydrALAZINE  (APRESOLINE ) injection 10 mg, Intravenous, Q4H PRN    insulin  lispro (HumaLOG ) inj PERCENTAGE MEAL EATEN 8 Units, Subcutaneous, 3xd Meals    insulin  lispro (HumaLOG ) injection CORRECTIONAL 0-20 Units, Subcutaneous, ACHS    insulin  NPH (HumuLIN ,NovoLIN ) injection 7 Units, Subcutaneous, Q12H SCH    labetalol (NORMODYNE) injection, Intravenous, Q4H PRN    levETIRAcetam  (KEPPRA ) tablet 500 mg, Oral, BID    melatonin tablet 3 mg, Oral, Nightly PRN    senna (SENOKOT) tablet 2 tablet, Oral, Nightly    tacrolimus  (ENVARSUS  XR) extended release tablet 6 mg, Oral, Daily    valsartan  (DIOVAN ) tablet 160 mg, Oral, Daily

## 2024-07-16 NOTE — Progress Notes (Signed)
 [facility]   INPATIENT PROGRESS NOTE      Brief History of Present Illness  Frank Leach is a 73 y.o. male with RCC/renal transplant now found to have a left frontal brain mass now s/p left craniotomy for resection (12/5).    Subjective/Interval History  Glucose continues to fluctuate but better control overall. No events overight.     Interval Imaging Reviewed  None    Neurological Assessment and Plan  **Frontal mass of brain  POD5 Left craniotomy for resection of brain mass (12/5)  - SBP < 160; Na > 135  - dex taper to 2mg  BID  - Keppra  500 BID x 7 days  - Continue tacrolimus  per nephro   - HOB > 30  - PT/OT  - Pending AIR    Anticoagulant/Antiplatelet needs: ASA- hold    Disposition: floor status    ___________________________________________________________________    Neurological Exam  EOSp  Ox3  PERRL  EOMI  FS  TML  RUE 4  LUE 5  RLE 4+  LLE 5    Incision c/d/I staples     The patient's vitals, intake/output, labs, orders, and relevant imaging were reviewed for the last 24 hours.    Problem List  Principal Problem:    Frontal mass of brain

## 2024-07-16 NOTE — Plan of Care (Signed)
 Shift Summary  Correctional insulin  was administered for elevated blood glucose during the shift.   Wound and IV sites remained clean and intact, with no dressing changes required.   Patient maintained appropriate cognition and stable motor function, with some consistent weakness in right upper extremity and right foot movements.   Family stayed bedside and provided support throughout the evening.   Overall, patient remained comfortable and independent in repositioning, with no pain reported.     Optimal Comfort and Wellbeing: Blood glucose remained elevated throughout the shift, and correctional insulin  was administered; no pain was reported and comfort interventions were declined. Family was present for support during the evening.     Skin Health and Integrity: Wound on scalp remained clean, dry, and intact with staples in place and no odor; peri-wound area and IV sites were also clean and intact, with no dressing changes required. Braden score was 20, and patient turned self in bed.     Improved Ability to Complete Activities of Daily Living: Patient was able to turn self in bed and walked occasionally, with slightly limited mobility noted. No assistance was required for repositioning.     Optimal Functional Ability: Motor responses and strength in all extremities remained stable, with some weakness in right foot dorsiflexion and plantar flexion; cognition was appropriate throughout the shift.     Optimal Pain Control and Function: No pain was reported during the shift and patient declined pain interventions.

## 2024-07-16 NOTE — Plan of Care (Signed)
 Shift Summary  Blood glucose was checked twice and correctional insulin  was given.   Fall prevention and safety interventions were consistently maintained, with no falls or injuries.   Surgical wound and IV sites remained clean, dry, and intact, with no interventions required.   Remained free of hospital-acquired complications, pain, or falls during the shift. Will continue to monitor for the remainder of the shift.     Absence of Hospital-Acquired Illness or Injury: No new hospital-acquired conditions were documented during the shift; temperature remained stable and within normal limits, and IV sites were clean, dry, and intact throughout. Skin integrity was noted as having exceptions, but no acute changes or interventions were required for IV sites.     Absence of Fall and Fall-Related Injury: Fall prevention strategies were consistently maintained, including bed alarms, non-skid footwear, hourly checks, and family presence; no falls or injuries occurred. R sided weakness and inattention were noted, and increased support was provided during mobility.     Optimal Coping: Psychosocial status was within defined limits, and family was present and updated throughout the shift.     Optimal Pain Control and Function: Pain was consistently reported as 0 and denied throughout the shift, with no interventions required.     Optimal Wound Healing: Surgical wound was clean, dry, intact, with staples in place and no odor or peri-wound issues; no dressing was present and no interventions were needed.

## 2024-07-16 NOTE — Progress Notes (Signed)
 Brain Tumor Neuro-Oncology at the [facility]   New  Consultation    Referred by Kandis Stefano Houston, MD  [address]  [address] [facility]  [place],  [place] [address]    Patient ID: [id number]    Diagnosis: GBM, IDH WT     History of Present Illness:   Frank Leach  is a 73 y.o. male with history of renal transplant (2020) who presented to the ER with a two week history of gait imbalance and right sided weakness. MRI brain showed a left frontal contrast enhancing lesion. He had resection with Dr Zettie on 07/11/24, path showed GBM, IDH WT.  Since surgery he is doing well, currently waiting to be discharged to rehab.    Last Chemo: none  Last radiation: none  Current Steroids dose: Decadron  2 mg BID, taper per neurosurgery  Current AED Dose: Keppra  ppx  Consents: tmz      Neurosurgeon: Dr Zettie  Radiation Oncologist: Dr Arzella  Social: lives in [place],      Past Medical History:   Past Medical History[1]    Past Surgical History:  Past Surgical History[2]    Family History:  Family History[3]      Allergies:   Patient has no known allergies.    Current Medications[4]    Review of systems:    Negative except above in HPI    Physical Exam:    Wt Readings from Last 1 Encounters:   07/13/24 73 kg (160 lb 15 oz)     Temp Readings from Last 1 Encounters:   07/16/24 36.4 ??C (97.5 ??F) (Oral)     BP Readings from Last 1 Encounters:   07/16/24 132/60     Pulse Readings from Last 1 Encounters:   07/16/24 53     SpO2 Readings from Last 1 Encounters:   07/16/24 96%         General: appears comfortable, sitting in chair, no acute distress noted    Neurological exam:   Mental Status: oriented to time place and person, attention, calculation, abstraction, learning and information intact  Speech: comprehension and fluency intact       Labs:   Lab Results   Component Value Date    HCT 36.7 (L) 07/15/2024    HCT 39.5 (L) 05/14/2013    WBC 19.1 (H) 07/15/2024    WBC 8.2 05/14/2013    Platelet 239 07/15/2024    Platelet 223 05/14/2013         Final Pathology:       Component  Ref Range & Units (hover)    Diagnosis   A-C: Brain tumor, biopsy (A), resection (B) and suction trap (C):  - Glioblastoma, IDH-wildtype, CNS WHO grade 4        This electronic signature is attestation that the pathologist personally reviewed the submitted material(s) and the final diagnosis reflects that evaluation.   Electronically signed by Morene Flynn Picking, MD on 07/15/2024 at [phone] EST   Diagnosis Comment    Testing for MGMT promoter methylation and IDH1, IDH2, and TERT promoter mutations is pending; the results will be issued separately.   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   OR Consultation Diagnosis    A frozen section consultation is requested by Dr. Zettie in OR # 11 with no page. Specimen delivery time is 1:43 PM.      FSA1: Brain tumor, representatives.  - Mild glial hypercellularity and atypia with microvascular proliferation-suspicious for glioma.     Dr. Lenton and Laurita on 07/11/24 at 2:12 PM.    Gross Description    A. Specimen labeled brain tumor is a 10 x 10 x 2 mm aggregate of soft pink tan tissue. Smeared as SMA1.  Representatives frozen as FSA1 with frozen remnant in block A1. Remaining tissue submitted as A2, NTR.   B. Labeled brain tumor and consists of multiple fragments of soft, tan-red soft tissue measuring 1.8 x 1.6 x 0.5 cm in aggrgeate. The specimen is submitted in blocks B1-B2, NTR.   C. Labeled brain tumor (suction trap) and consists of multiple fragments of soft, tan-red soft tissue measuring 2 x 1.0 x 0.5 cm in aggregate. The specimen is submitted in blocks C1-C2, NTR. Randal)   Microscopic Description    The intraoperative consultative diagnosis is confirmed.     The tumor is present in all specimens (A, B, and C). Sections show a high-grade infiltrative glial neoplasm with tumor cells that show nuclear atypia, increased nuclear to cytoplasmic ratios and microvascular proliferation with scattered apoptotic and mitotic figures. Necrosis is not identified.      Immunohistochemical stains are performed on blocks B1 and B2 yielding results in the tumor cells as follows:  IDH1-R132H (B1): Favor negative (blush staining, favor nonspecific)  ATRX (B1): Retained  P53 (B1): Wild-type pattern  H3 K27M (B1): Negative  Ki-67 (B1): Up to ~20% focally  GFAP (B2): Positive  Olig2 (B2): Predominantly positive  Neurofilament (B2): Highlights intermixed axons/neuropil, supporting infiltrative growth pattern  CD34 (B2): Highlights vessels, negative in tumor cells     Reviewed by resident physician Toribio Gentile, DO [facility])   Ancillary Studies    Best tumor block for additional testing: B2, B1, C2   Disclaimer    Unless otherwise specified, specimens are preserved using 10% neutral buffered formalin. For cases in which immunohistochemical and/or in-situ hybridization stains are performed, the following statement applies: Appropriate controls for each stain (positive controls with or without negative controls) have been evaluated and stain as expected. These stains have not been separately validated for use on decalcified specimens and should be interpreted with caution in that setting. Some of the reagents used for these stains may be classified as analyte specific reagents (ASR). Tests using ASRs were developed, and their performance characteristics were determined, by the Anatomic Pathology Department [facility]). They have not been cleared or approved by the US  Food and Drug Administration (FDA). The FDA does not require these tests to go through premarket FDA review. These tests are used for clinical purposes. They should not be regarded as investigational or for research. This laboratory is certified under the Clinical Laboratory Improvement Amendments (CLIA) as qualified to perform high complexity clinical laboratory testing.       Tempus: will order post discharge    Imaging:   MRI brain 07/11/24 (post op):  Postoperative changes of resection of the left frontal mass with small volume subdural and resection bed blood products. Nodular enhancement around the surgical bed along the inferior aspect of the resection bed may represent residual disease.   MRI brain 07/04/24:  Peripherally enhancing mass within the left frontal lobe measuring up  to 4.4 cm, concerning for malignancy.       Assessment and Plan:   Frank Leach  is a 73 y.o. 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    GBM, IDH WT:  - s/p resection with Dr Zettie on 07/11/24  - path as above  - We discussed the pathophysiology, treatment and prognosis of GBM, IDH WT. We discussed standard care of treatment for the elderly which is Temozolomide 75 mg/m2 oral daily for 3 weeks with adjuvant focal radiation Monday to Friday for 3 weeks followed by 6 cycles of adjuvant TMZ.   - Weekly CBC and Chem 7  - Tempus testing   - patient will meet with rad onc to discuss duration of RT but my recommendation is to stay with the elderly protocol given age >26 and history of renal transplant    2. History of renal transplant (2020):  - follow with Dr [name], will send him an epic message and will get ok for TMZ  - currently on tacrolimus , managed by nephrology    3. Bladder mass:  - plan for outpatient cystoscopy with urology      Signed,     Jacky Herald, MD  12:40 PM  07/16/2024  Staff Neuro-oncologist  [facility]    CC:         [1]   Past Medical History:  Diagnosis Date    Anemia     Cancer    (CMS-HCC)     clear cell adenocarcinoma of left kidney    Diabetes mellitus (CMS-HCC)     Diabetic nephropathy (CMS-HCC)     ESRD (end stage renal disease) on dialysis    (CMS-HCC)     ESRD on dialysis (CMS-HCC)     Gout     Hyperlipidemia     Hypertension    [2]   Past Surgical History:  Procedure Laterality Date    AV FISTULA PLACEMENT      PR COLONOSCOPY FLX DX W/COLLJ SPEC WHEN PFRMD N/A 04/30/2018    Procedure: COLONOSCOPY, FLEXIBLE, PROXIMAL TO SPLENIC FLEXURE; DIAGNOSTIC, W/WO COLLECTION SPECIMEN BY BRUSH OR WASH;  Surgeon: Dorn Lynwood Lauth, MD;  Location: [facility];  Service: Gastroenterology    PR COLONOSCOPY W/BIOPSY SINGLE/MULTIPLE N/A 04/30/2018    Procedure: COLONOSCOPY, FLEXIBLE, PROXIMAL TO SPLENIC FLEXURE; WITH BIOPSY, SINGLE OR MULTIPLE;  Surgeon: Dorn Lynwood Lauth, MD;  Location: [facility] PROCED[facility];  Service: Gastroenterology    PR COLSC FLX W/RMVL OF TUMOR POLYP LESION SNARE TQ N/A 04/30/2018    Procedure: COLONOSCOPY FLEX; W/REMOV TUMOR/LES BY SNARE;  Surgeon: Dorn Lynwood Lauth, MD;  Location: [facility] PROCEDURES [facility];  Service: Gastroenterology    PR EXPLORATORY OF ABDOMEN Midline 04/14/2016    Procedure: EXPLORATORY LAPAROTOMY, EXPLORATORY CELIOTOMY WITH OR WITHOUT BIOPSY(S);  Surgeon: Marsa Sam Boss, MD;  Location: MAIN OR [facility];  Service: Transplant    PR EXPLORATORY OF ABDOMEN N/A 04/16/2016    Procedure: EXPLORATORY LAPAROTOMY, EXPLORATORY CELIOTOMY WITH OR WITHOUT BIOPSY(S);  Surgeon: Marsa Sam Boss, MD;  Location: MAIN OR [facility];  Service: Transplant    PR LAP, RADICAL NEPHRECTOMY Right 12/27/2016    Procedure: Robotic Xi Laparoscopy; Radical Nephrectomy (Incl Remove Gerota`S Fascia, Fatty Tissue, Reg Lymph Node, Adrenalectomy);  Surgeon: Clorinda Ole Chalk, MD;  Location: MAIN OR  [facility];  Service: Urology    PR LIGATN ANGIOACCESS AV FISTULA Left 11/29/2022    Procedure: LIGATION OR BANDING OF ANGIOACCESS ARTERIOVENOUS FISTULA;  Surgeon: Toledo, Marsa Messier, MD;  Location: MAIN OR [place];  Service: Transplant    PR NEGATIVE PRESSURE WOUND THERAPY DME >50 SQ CM N/A 04/12/2016    Procedure: NEG PRESS WOUND TX (VAC ASSIST) INCL TOPICALS, PER SESSION, TSA GREATER THAN/= 50 CM SQUARED;  Surgeon: Marsa Messier Boss, MD;  Location: MAIN OR [place];  Service: Transplant    PR NEGATIVE PRESSURE WOUND THERAPY DME >50 SQ CM Bilateral 04/14/2016    Procedure: NEG PRESS WOUND TX (VAC ASSIST) INCL TOPICALS, PER SESSION, TSA GREATER THAN/= 50 CM SQUARED;  Surgeon: Marsa Messier Boss, MD;  Location: MAIN OR [place];  Service: Transplant    PR NEGATIVE PRESSURE WOUND THERAPY DME >50 SQ CM N/A 04/16/2016    Procedure: NEG PRESS WOUND TX (VAC ASSIST) INCL TOPICALS, PER SESSION, TSA GREATER THAN/= 50 CM SQUARED;  Surgeon: Marsa Messier Boss, MD;  Location: MAIN OR [facility];  Service: Transplant    PR NEPHRECTOMY, W/PART. URETECTOMY Bilateral 04/11/2016    Procedure: LAPAROSCOPY, SURGICAL, NEPHRECTOMY WITH TOTAL URETERECTOMY;  Surgeon: Marsa Messier Boss, MD;  Location: MAIN OR [facility];  Service: Transplant    PR REMV KIDNEY,W/RIB RESECTION Bilateral 04/11/2016    Procedure: NEPHRECTOMY, INCLUDING PARTIAL URETERECTOMY, ANY OPEN APPROACH INCLUDING RIB RESECTION;  Surgeon: Marsa Messier Boss, MD;  Location: MAIN OR [facility];  Service: Transplant    PR TRANSPLANT,PREP LIVING  RENAL GRAFT N/A 06/22/2019    Procedure: BACKBENCH STD PREP LIVING DONOR RENAL ALLGRFT (OPEN/LAPROSC) PRIOR TO TRANSPLANT, INC DISSECT/REM AS NECESS;  Surgeon: Marsa Messier Boss, MD;  Location: MAIN OR [facility];  Service: Transplant    PR TRANSPLANTATION OF KIDNEY N/A 06/22/2019    Procedure: RENAL ALLOTRANSPLANTATION, IMPLANTATION OF GRAFT; WITHOUT RECIPIENT NEPHRECTOMY;  Surgeon: Marsa Messier Boss, MD;  Location: MAIN OR [facility];  Service: Transplant    SPLENECTOMY     [3]   Family History  Problem Relation Age of Onset    Kidney disease Mother     Cancer Father     Kidney disease Brother    [4]   Current Facility-Administered Medications   Medication Dose Route Frequency Provider Last Rate Last Admin    acetaminophen  (TYLENOL ) tablet 650 mg  650 mg Oral Q6H PRN Vicci Chiquita Sat, ACNP        [Provider Hold] amlodipine  (NORVASC ) tablet 10 mg  10 mg Oral Daily Vicci Chiquita Dimsdale, ACNP   10 mg at 07/11/24 1743    atorvastatin  (LIPITOR ) tablet 40 mg  40 mg Oral Daily Vicci Chiquita Sat, ACNP   40 mg at 07/16/24 0933    dexAMETHasone  (DECADRON ) tablet 2 mg  2 mg Oral Q6H SCH Gilbert, Olivia E, MD   2 mg at 07/16/24 1132    Followed by    NOREEN ON 07/18/2024] dexAMETHasone  (DECADRON ) tablet 2 mg  2 mg Oral Q8H SCH Gilbert, Olivia E, MD        Followed by    NOREEN ON 07/20/2024] dexAMETHasone  (DECADRON ) tablet 2 mg  2 mg Oral Q12H SCH Gilbert, Olivia E, MD        Followed by    NOREEN ON 07/22/2024] dexAMETHasone  (DECADRON ) tablet 1 mg  1 mg Oral Q12H SCH Gilbert, Olivia E, MD        dextrose  50 % in water  (D50W) 50 % solution 12.5 g  12.5 g Intravenous Q15 Min PRN Vicci Chiquita  Dimsdale, ACNP        famotidine  (PEPCID ) tablet 20 mg  20 mg Oral Daily Quinn, Megan R, ACNP   20 mg at 07/16/24 9066    heparin  (porcine) 5,000 unit/mL injection 5,000 Units  5,000 Units Subcutaneous Q8H SCH Bonnetta Carleton Norris, ACNP   5,000 Units at 07/16/24 9458    hydrALAZINE  (APRESOLINE ) injection 10 mg  10 mg Intravenous Q4H PRN Bonnetta Carleton Norris, ACNP        insulin  lispro (HumaLOG ) inj PERCENTAGE MEAL EATEN 8 Units  8 Units Subcutaneous 3xd Meals Leotis Izetta Gibson, FNP   8 Units at 07/16/24 0720    insulin  lispro (HumaLOG ) injection CORRECTIONAL 0-20 Units  0-20 Units Subcutaneous ACHS Leotis, April Lynne, FNP   1 Units at 07/16/24 1132    insulin  NPH (HumuLIN ,NovoLIN ) injection 7 Units  7 Units Subcutaneous Q12H [facility] G[facility], April Lynne, FNP   7 Units at 07/16/24 1156    labetalol (NORMODYNE) injection  10 mg Intravenous Q4H PRN Bonnetta Carleton Norris, ACNP        levETIRAcetam  (KEPPRA ) tablet 500 mg  500 mg Oral BID Parker, Wesley R, ACNP   500 mg at 07/16/24 9065    melatonin tablet 3 mg  3 mg Oral Nightly PRN Rardin, Wanda, PA   3 mg at 07/13/24 0143    senna (SENOKOT) tablet 2 tablet  2 tablet Oral Nightly Kennyth Darryle SAUNDERS, ACNP   2 tablet at 07/15/24 2112    tacrolimus  (ENVARSUS  XR) extended release tablet 6 mg  6 mg Oral Daily Karry Wells HERO, MD   6 mg at 07/16/24 9065    valsartan  (DIOVAN ) tablet 160 mg  160 mg Oral Daily Bonnetta Carleton Norris, ACNP   160 mg at 07/16/24 9065

## 2024-07-16 NOTE — Consults (Signed)
 Endocrine Team Diabetes Follow Up Consult Note     Consult information:  Requesting Attending Physician : Rosaria CHRISTELLA Lush, MD  Service Requesting Consult : Neurosurgery [facility])  Primary Care Provider: Kandis Stefano [place], MD  Impression:  Frank Leach is a 73 y.o. male admitted for brain lesion. We have been consulted at the request of Rosaria CHRISTELLA Lush, MD to evaluate Frank Leach for hyperglycemia.     Medical Decision Making:  Diagnoses:  1.Type 2 Diabetes. Uncontrolled With severe hyperglycemia last 24 hours.  2. Nutrition: Complicating glycemic control. Increasing risk for both hypoglycemia and hyperglycemia.  3. Steroids. Complicating glycemic control and increasing risk for hyperglycemia.  4. Transplant. Complicating glycemic control and increasing risk for hyperglycemia.  5. Chronic Kidney Disease. Complicating glycemic control and increasing risk for hypoglycemia.        Studies reviewed 07/16/24:  Labs: CBC, BMP, POCT-BG, and HbA1C  Interpretation: +Leukocytosis. Hyperkalemia. Elevated Cr in the setting of known CKD. Hyperglycemia with severe. A1C 6.8% indicates excellent control outpatient.  Notes reviewed: Primary team and nursing notes      Overall impression based on above reviews and history:  Hyperglycemia in patient with known type 2 diabetes complicated by steroids. Severe hyperglycemia yesterday morning. Increased nutritional dose yesterday and BG improved. Steroids continue to taper as below.       Steroid taper:  12/7: dexamethasone  4mg  q6h  12/9: dexamethasone  2mg  q6h  12/12: dexamethasone  2mg  q8h  12/14: dexamethasone  2mg  q12h  12/16: dexamethasone  1mg  q12h    Recommendations:  - NPH 7u q12h  - lispro 8u qAC  - lispro target 140, ISF 20 achs  - Hypoglycemia protocol.  - POCT-BG achs.  - Ensure patient is on glucose precautions if patient taking nutrition by mouth.     Discharge planning:  In process. Will complete actual plan closer to discharge.   Anticipate discharging back on home regimen. May depend on steroids.     Thank you for this consult. Discussed plan with primary team. We will continue to follow and make recommendations and place orders as appropriate.    Please page with questions or concerns: Odalis Jordan, NP: [phone]  DCT on call from 6AM - 3PM on weekdays then endocrine fellow on call: [phone] from 3PM - 6AM on weekdays and on weekends and holidays.   If APP cannot be reached, please page the endocrine fellow on call.      Subjective:  Interval history: Reports decent po intake. Denies complaints at time of rounds.     Initial HPI:  Frank Leach is a 73 y.o. 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     Diabetes History:  Patient has a history of Type 2 diabetes diagnosed at least 15+ years ago.  Diabetes is managed by: PCP, nephrologist.  Current home diabetes regimen: metformin  XR 500mg  qAM,empagliflozin  25mg  qam.  Current home blood glucose monitoring:  occasionally.  Hypoglycemia awareness: yes.  Complications related to diabetes: ESRD s/p transplant in 2020      Current Nutrition:  Active Orders   Diet    Nutrition Therapy Regular/House       ROS: As per HPI.    Scheduled Medications[1]    Current Outpatient Medications   Medication Instructions    acetaminophen  (TYLENOL ) 500-1,000 mg, Oral, Every  6 hours PRN    allopurinol  (ZYLOPRIM ) 100 mg, Oral, Every evening    [Paused] amlodipine  (NORVASC ) 10 mg, Oral, Daily (standard)    atorvastatin  (LIPITOR ) 40 mg, Oral, Daily (standard)    dexAMETHasone  (DECADRON ) 1 MG tablet Take 4 tablets (4 mg total) by mouth every twelve (12) hours for 3 days, THEN 4 tablets (4 mg total) daily for 3 days, THEN 2 tablets (2 mg total) daily for 3 days, THEN 1 tablet (1 mg total) daily for 3 days.    ENVARSUS  XR 4 mg, Oral, Daily (standard), Take in addition to ONE 1 mg tablets for a total daily dose of 5 mg.    latanoprost (XALATAN) 0.005 % ophthalmic solution 1 drop, Nightly    magnesium  oxide-Mg AA chelate (MAGNESIUM , AMINO ACID CHELATE,) 133 mg 1 tablet, Oral, 2 times a day    metFORMIN  (GLUCOPHAGE -XR) 500 mg, Oral, Daily (RT)    [Paused] mycophenolate  (MYFORTIC ) 360 mg, Oral, 2 times a day (standard)    [Paused] olmesartan  (BENICAR ) 40 mg, Oral, Daily (standard)    [START ON 07/20/2024] predniSONE  (DELTASONE ) 5 mg, Oral, Daily (standard)    tacrolimus  (ENVARSUS  XR) 1 mg Tb24 extended release tablet 1 tablet, Daily           Past Medical History[2]    Past Surgical History[3]    Family History[4]    Short Social History[5]    OBJECTIVE:  BP 129/61  - Pulse 53  - Temp 36.4 ??C (97.5 ??F) (Oral)  - Resp 18  - Ht 170.2 cm (5' 7.01)  - Wt 73 kg (160 lb 15 oz)  - SpO2 96%  - BMI 25.20 kg/m??   Wt Readings from Last 12 Encounters:   07/13/24 73 kg (160 lb 15 oz)   07/03/24 72.6 kg (160 lb)   01/18/24 73.2 kg (161 lb 6.4 oz)   06/08/23 68.9 kg (151 lb 12.8 oz)   03/22/23 68.3 kg (150 lb 8 oz)   02/14/23 68.4 kg (150 lb 12.8 oz)   12/21/22 68 kg (149 lb 14.4 oz)   11/27/22 67.6 kg (149 lb)   11/02/22 68.1 kg (150 lb 3.2 oz)   10/13/22 68.2 kg (150 lb 6.4 oz)   06/02/22 67 kg (147 lb 9.6 oz)   02/08/22 67.4 kg (148 lb 9.6 oz)     Physical Exam  Vitals and nursing note reviewed.   Constitutional:       General: He is not in acute distress.     Appearance: Normal appearance.   Pulmonary:      Effort: Pulmonary effort is normal. No respiratory distress.   Skin:     General: Skin is warm and dry.   Neurological:      General: No focal deficit present.      Mental Status: He is alert and oriented to person, place, and time.   Psychiatric:         Mood and Affect: Mood normal.         Behavior: Behavior normal.         BG/insulin  reviewed per EMR.   Glucose, POC   Date Value   07/16/2024 116 mg/dL   87/90/7974 754 mg/dL (H)   87/90/7974 843 mg/dL   87/90/7974 857 mg/dL   87/90/7974 849 mg/dL   87/91/7974 694 mg/dL (H)   87/91/7974 718 mg/dL (H)   87/91/7974 793 mg/dL (H)   89/76/7986 784 MG/DL (H)   90/74/7986 55 MG/DL (L)   90/74/7986 52 MG/DL (  L)   01/26/2012 96 MG/DL   93/78/7986 844 MG/DL   93/78/7986 877 MG/DL   93/79/7986 768 MG/DL (H)   93/79/7986 91 MG/DL        Summary of labs:  Lab Results   Component Value Date    A1C 6.8 (H) 07/03/2024    A1C 6.2 (H) 01/07/2024    A1C 6.0 (H) 09/10/2023     Lab Results   Component Value Date    GFR 5.11 (L) 05/01/2012    CREATININE 1.66 (H) 07/15/2024     Lab Results   Component Value Date    WBC 19.1 (H) 07/15/2024    HGB 11.9 (L) 07/15/2024    HCT 36.7 (L) 07/15/2024    PLT 239 07/15/2024       Lab Results   Component Value Date    NA 141 07/15/2024    K 4.2 07/15/2024    CL 106 07/15/2024    CO2 24.0 07/15/2024    BUN 36 (H) 07/15/2024    CREATININE 1.66 (H) 07/15/2024    GLU 251 (H) 07/15/2024    CALCIUM 9.6 07/15/2024    MG 2.0 07/14/2024    PHOS 3.6 07/15/2024       Lab Results   Component Value Date    BILITOT 0.2 (L) 07/03/2024    BILIDIR 0.20 12/10/2023    PROT 7.2 07/03/2024    ALBUMIN 2.9 (L) 07/15/2024    ALT 17 07/03/2024    AST 20 07/03/2024    ALKPHOS 78 07/03/2024    GGT 20 06/21/2019                         [1]    [Provider Hold] amlodipine   10 mg Oral Daily    atorvastatin   40 mg Oral Daily    dexAMETHasone   2 mg Oral Q6H SCH    Followed by    Leach ON 07/18/2024] dexAMETHasone   2 mg Oral Q8H SCH    Followed by    Leach ON 07/20/2024] dexAMETHasone   2 mg Oral Q12H SCH    Followed by    Leach ON 07/22/2024] dexAMETHasone   1 mg Oral Q12H SCH    famotidine   20 mg Oral Daily    heparin  (porcine) for subcutaneous use  5,000 Units Subcutaneous Q8H SCH    insulin  lispro  8 Units Subcutaneous 3xd Meals    insulin  lispro  0-20 Units Subcutaneous ACHS    insulin  NPH  7 Units Subcutaneous Q12H SCH    levETIRAcetam   500 mg Oral BID    senna  2 tablet Oral Nightly    tacrolimus   6 mg Oral Daily valsartan   160 mg Oral Daily   [2]   Past Medical History:  Diagnosis Date    Anemia     Cancer    (CMS-HCC)     clear cell adenocarcinoma of left kidney    Diabetes mellitus (CMS-HCC)     Diabetic nephropathy (CMS-HCC)     ESRD (end stage renal disease) on dialysis    (CMS-HCC)     ESRD on dialysis (CMS-HCC)     Gout     Hyperlipidemia     Hypertension    [3]   Past Surgical History:  Procedure Laterality Date    AV FISTULA PLACEMENT      PR COLONOSCOPY FLX DX W/COLLJ SPEC WHEN PFRMD N/A 04/30/2018    Procedure: COLONOSCOPY, FLEXIBLE, PROXIMAL TO SPLENIC FLEXURE; DIAGNOSTIC, W/WO COLLECTION SPECIMEN BY BRUSH  OR WASH;  Surgeon: Dorn Lynwood Lauth, MD;  Location: [facility];  Service: Gastroenterology    PR COLONOSCOPY W/BIOPSY SINGLE/MULTIPLE N/A 04/30/2018    Procedure: COLONOSCOPY, FLEXIBLE, PROXIMAL TO SPLENIC FLEXURE; WITH BIOPSY, SINGLE OR MULTIPLE;  Surgeon: Dorn Lynwood Lauth, MD;  Location: [facility];  Service: Gastroenterology    PR COLSC FLX W/RMVL OF TUMOR POLYP LESION SNARE TQ N/A 04/30/2018    Procedure: COLONOSCOPY FLEX; W/REMOV TUMOR/LES BY SNARE;  Surgeon: Dorn Lynwood Lauth, MD;  [facility];  Service: Gastroenterology    PR EXPLORATORY OF ABDOMEN Midline 04/14/2016    Procedure: EXPLORATORY LAPAROTOMY, EXPLORATORY CELIOTOMY WITH OR WITHOUT BIOPSY(S);  Surgeon: Marsa Sam Boss, MD;  Location: MAIN OR [facility];  Service: Transplant    PR EXPLORATORY OF ABDOMEN N/A 04/16/2016    Procedure: EXPLORATORY LAPAROTOMY, EXPLORATORY CELIOTOMY WITH OR WITHOUT BIOPSY(S);  Surgeon: Marsa Sam Boss, MD;  Location: MAIN OR [facility];  Service: Transplant    PR LAP, RADICAL NEPHRECTOMY Right 12/27/2016    Procedure: Robotic Xi Laparoscopy; Radical Nephrectomy (Incl Remove Gerota`S Fascia, Fatty Tissue, Reg Lymph Node, Adrenalectomy);  Surgeon: Clorinda Ole Chalk, MD;  Location: MAIN OR [facility];  Service: Urology    PR LIGATN ANGIOACCESS AV FISTULA Left 11/29/2022    Procedure: LIGATION OR BANDING OF ANGIOACCESS ARTERIOVENOUS FISTULA;  Surgeon: [place], Marsa Sam, MD;  Location: MAIN OR [place];  Service: Transplant    PR NEGATIVE PRESSURE WOUND THERAPY DME >50 SQ CM N/A 04/12/2016    Procedure: NEG PRESS WOUND TX (VAC ASSIST) INCL TOPICALS, PER SESSION, TSA GREATER THAN/= 50 CM SQUARED;  Surgeon: Marsa Sam Boss, MD;  Location: MAIN OR [place];  Service: Transplant    PR NEGATIVE PRESSURE WOUND THERAPY DME >50 SQ CM Bilateral 04/14/2016    Procedure: NEG PRESS WOUND TX (VAC ASSIST) INCL TOPICALS, PER SESSION, TSA GREATER THAN/= 50 CM SQUARED;  Surgeon: Marsa Sam Boss, MD;  Location: MAIN OR [place];  Service: Transplant    PR NEGATIVE PRESSURE WOUND THERAPY DME >50 SQ CM N/A 04/16/2016    Procedure: NEG PRESS WOUND TX (VAC ASSIST) INCL TOPICALS, PER SESSION, TSA GREATER THAN/= 50 CM SQUARED;  Surgeon: Marsa Sam Boss, MD;  Location: MAIN OR [facility];  Service: Transplant    PR NEPHRECTOMY, W/PART. URETECTOMY Bilateral 04/11/2016    Procedure: LAPAROSCOPY, SURGICAL, NEPHRECTOMY WITH TOTAL URETERECTOMY;  Surgeon: Marsa Sam Boss, MD;  Location: MAIN OR [facility];  Service: Transplant    PR REMV KIDNEY,W/RIB RESECTION Bilateral 04/11/2016    Procedure: NEPHRECTOMY, INCLUDING PARTIAL URETERECTOMY, ANY OPEN APPROACH INCLUDING RIB RESECTION;  Surgeon: Marsa Sam Boss, MD;  Location: MAIN OR [facility];  Service: Transplant    PR TRANSPLANT,PREP LIVING  RENAL GRAFT N/A 06/22/2019    Procedure: BACKBENCH STD PREP LIVING DONOR RENAL ALLGRFT (OPEN/LAPROSC) PRIOR TO TRANSPLANT, INC DISSECT/REM AS NECESS;  Surgeon: Marsa Sam Boss, MD;  Location: MAIN OR [facility];  Service: Transplant    PR TRANSPLANTATION OF KIDNEY N/A 06/22/2019    Procedure: RENAL ALLOTRANSPLANTATION, IMPLANTATION OF GRAFT; WITHOUT RECIPIENT NEPHRECTOMY;  Surgeon: Marsa Sam Boss, MD;  Location: MAIN OR [facility];  Service: Transplant    SPLENECTOMY     [4]   Family History  Problem Relation Age of Onset    Kidney disease Mother     Cancer Father     Kidney disease Brother    [5]   Social History  Tobacco Use    Smoking status: Never    Smokeless tobacco: Never  Vaping Use    Vaping status: Never Used   Substance Use Topics    Alcohol use: No    Drug use: No

## 2024-07-17 ENCOUNTER — Ambulatory Visit: Admit: 2024-07-17 | Discharge: 2024-08-06 | Payer: Medicare (Managed Care)

## 2024-07-17 ENCOUNTER — Ambulatory Visit
Admit: 2024-07-17 | Discharge: 2024-08-06 | Payer: Medicare (Managed Care) | Attending: Radiation Oncology | Primary: Radiation Oncology

## 2024-07-17 ENCOUNTER — Inpatient Hospital Stay: Admit: 2024-07-17 | Discharge: 2024-07-18 | Payer: Medicare (Managed Care)

## 2024-07-17 ENCOUNTER — Encounter
Admit: 2024-07-17 | Discharge: 2024-08-06 | Payer: Medicare (Managed Care) | Attending: Radiation Oncology | Primary: Radiation Oncology

## 2024-07-17 ENCOUNTER — Ambulatory Visit: Admit: 2024-07-17 | Discharge: 2024-08-06 | Payer: Medicare (Managed Care) | Attending: Nephrology | Primary: Nephrology

## 2024-07-17 LAB — RENAL FUNCTION PANEL
ALBUMIN: 3.1 g/dL — ABNORMAL LOW (ref 3.4–5.0)
ANION GAP: 12 mmol/L (ref 5–14)
BLOOD UREA NITROGEN: 36 mg/dL — ABNORMAL HIGH (ref 9–23)
BUN / CREAT RATIO: 21
CALCIUM: 10.1 mg/dL (ref 8.7–10.4)
CHLORIDE: 109 mmol/L — ABNORMAL HIGH (ref 98–107)
CO2: 26 mmol/L (ref 20.0–31.0)
CREATININE: 1.72 mg/dL — ABNORMAL HIGH (ref 0.73–1.18)
EGFR CKD-EPI (2021) MALE: 42 mL/min/1.73m2 — ABNORMAL LOW (ref >=60)
GLUCOSE RANDOM: 166 mg/dL (ref 70–179)
PHOSPHORUS: 3.8 mg/dL (ref 2.4–5.1)
POTASSIUM: 4.7 mmol/L (ref 3.4–4.8)
SODIUM: 147 mmol/L — ABNORMAL HIGH (ref 135–145)

## 2024-07-17 LAB — CBC
HEMATOCRIT: 39 % (ref 39.0–48.0)
HEMOGLOBIN: 12.6 g/dL — ABNORMAL LOW (ref 12.9–16.5)
MEAN CORPUSCULAR HEMOGLOBIN CONC: 32.3 g/dL (ref 32.0–36.0)
MEAN CORPUSCULAR HEMOGLOBIN: 29.1 pg (ref 25.9–32.4)
MEAN CORPUSCULAR VOLUME: 90 fL (ref 77.6–95.7)
MEAN PLATELET VOLUME: 9.4 fL (ref 6.8–10.7)
PLATELET COUNT: 261 10*9/L (ref 150–450)
RED BLOOD CELL COUNT: 4.33 10*12/L (ref 4.26–5.60)
RED CELL DISTRIBUTION WIDTH: 15.6 % — ABNORMAL HIGH (ref 12.2–15.2)
WBC ADJUSTED: 20.3 10*9/L — ABNORMAL HIGH (ref 3.6–11.2)

## 2024-07-17 LAB — TACROLIMUS LEVEL, TROUGH: TACROLIMUS, TROUGH: 3 ng/mL — ABNORMAL LOW (ref 5.0–15.0)

## 2024-07-17 MED ADMIN — tacrolimus (ENVARSUS XR) extended release tablet 6 mg: 6 mg | ORAL | @ 14:00:00

## 2024-07-17 MED ADMIN — heparin (porcine) 5,000 unit/mL injection 5,000 Units: 5000 [IU] | SUBCUTANEOUS | @ 20:00:00

## 2024-07-17 MED ADMIN — heparin (porcine) 5,000 unit/mL injection 5,000 Units: 5000 [IU] | SUBCUTANEOUS | @ 11:00:00

## 2024-07-17 MED ADMIN — heparin (porcine) 5,000 unit/mL injection 5,000 Units: 5000 [IU] | SUBCUTANEOUS | @ 03:00:00

## 2024-07-17 MED ADMIN — famotidine (PEPCID) tablet 20 mg: 20 mg | ORAL | @ 14:00:00

## 2024-07-17 MED ADMIN — dexAMETHasone (DECADRON) tablet 2 mg: 2 mg | ORAL | @ 11:00:00 | Stop: 2024-07-18

## 2024-07-17 MED ADMIN — dexAMETHasone (DECADRON) tablet 2 mg: 2 mg | ORAL | @ 05:00:00 | Stop: 2024-07-18

## 2024-07-17 MED ADMIN — dexAMETHasone (DECADRON) tablet 2 mg: 2 mg | ORAL | @ 17:00:00 | Stop: 2024-07-18

## 2024-07-17 MED ADMIN — dexAMETHasone (DECADRON) tablet 2 mg: 2 mg | ORAL | @ 23:00:00 | Stop: 2024-07-18

## 2024-07-17 MED ADMIN — senna (SENOKOT) tablet 2 tablet: 2 | ORAL | @ 03:00:00

## 2024-07-17 MED ADMIN — insulin lispro (HumaLOG) injection CORRECTIONAL 0-20 Units: 0-20 [IU] | SUBCUTANEOUS | @ 22:00:00

## 2024-07-17 MED ADMIN — insulin lispro (HumaLOG) injection CORRECTIONAL 0-20 Units: 0-20 [IU] | SUBCUTANEOUS | @ 03:00:00

## 2024-07-17 MED ADMIN — levETIRAcetam (KEPPRA) tablet 500 mg: 500 mg | ORAL | @ 03:00:00 | Stop: 2024-07-18

## 2024-07-17 MED ADMIN — levETIRAcetam (KEPPRA) tablet 500 mg: 500 mg | ORAL | @ 14:00:00 | Stop: 2024-07-17

## 2024-07-17 MED ADMIN — atorvastatin (LIPITOR) tablet 40 mg: 40 mg | ORAL | @ 14:00:00

## 2024-07-17 MED ADMIN — insulin NPH (HumuLIN,NovoLIN) injection 7 Units: 7 [IU] | SUBCUTANEOUS | @ 03:00:00

## 2024-07-17 MED ADMIN — insulin NPH (HumuLIN,NovoLIN) injection 7 Units: 7 [IU] | SUBCUTANEOUS | @ 14:00:00

## 2024-07-17 MED ADMIN — valsartan (DIOVAN) tablet 160 mg: 160 mg | ORAL | @ 14:00:00

## 2024-07-17 MED ADMIN — insulin lispro (HumaLOG) inj PERCENTAGE MEAL EATEN 8 Units: 8 [IU] | SUBCUTANEOUS | @ 14:00:00

## 2024-07-17 MED ADMIN — insulin lispro (HumaLOG) inj PERCENTAGE MEAL EATEN 8 Units: 8 [IU] | SUBCUTANEOUS | @ 23:00:00

## 2024-07-17 NOTE — Plan of Care (Signed)
 Shift Summary  Surgical site remained clean, dry, and intact with no dressing required throughout the shift.    Patient participated in PT and gait training.  Blood glucose increased during the shift, resulting in administration of correctional insulin .    Patient remained motivated and engaged in therapy, with family support present and no pain reported. Will continue to monitor throughout the shift.     Absence of Hospital-Acquired Illness or Injury: No new hospital-acquired injuries documented; fall reduction and safety interventions were maintained throughout the shift. Surgical site remained clean, dry, and intact with no odor or peri-wound concerns noted.     Readiness for Transition of Care: Patient participated in gait training and functional transfers with minimal assistance, remained motivated, and agreed with PT session; discharge planning includes high-intensity PT 5x/week to address mobility deficits. IRF candidacy score increased during the shift.     Skin Health and Integrity: Surgical site assessed as clean, dry, and intact with staples in place and no dressing required; peri-wound area also clean and intact. Adhesive use was limited and frequent weight shifts encouraged for skin protection.     Optimal Coping: Psychosocial status remained within defined limits and family was present for support during the shift.     Optimal Pain Control and Function: Patient denied pain during PT session and was able to participate in all planned activities.

## 2024-07-17 NOTE — Progress Notes (Signed)
 [facility]   INPATIENT PROGRESS NOTE      Brief History of Present Illness  Frank Leach is a 73 y.o. male with RCC/renal transplant now found to have a left frontal brain mass now s/p left craniotomy for resection (12/5).    Subjective/Interval History  No events overnight. Pending insurance auth for AIR. RLE strength improving     Interval Imaging Reviewed  None    Neurological Assessment and Plan  **Frontal mass of brain  POD6 Left craniotomy for resection of brain mass (12/5)  - SBP < 160; Na > 135  - dex taper to 2mg  BID  - Keppra  500 BID x 7 days  - Continue tacrolimus  per nephro   - HOB > 30  - PT/OT  - Pending AIR    Anticoagulant/Antiplatelet needs: ASA- hold    Disposition: floor status    ___________________________________________________________________    Neurological Exam  EOSp  Ox3  PERRL  EOMI  FS  TML  RUE 4  LUE 5  RLE 4++  LLE 5    Incision c/d/I staples     The patient's vitals, intake/output, labs, orders, and relevant imaging were reviewed for the last 24 hours.    Problem List  Principal Problem:    Frontal mass of brain

## 2024-07-17 NOTE — Consults (Signed)
 Endocrine Team Diabetes Follow Up Consult Note     Consult information:  Requesting Attending Physician : Rosaria CHRISTELLA Lush, MD  Service Requesting Consult : Neurosurgery [facility])  Primary Care Provider: Kandis Stefano [place], MD  Impression:  Frank Leach is a 73 y.o. male admitted for brain lesion. We have been consulted at the request of Rosaria CHRISTELLA Lush, MD to evaluate [name] for hyperglycemia.     Medical Decision Making:  Diagnoses:  1.Type 2 Diabetes. Uncontrolled With hyperglycemia.  2. Nutrition: Complicating glycemic control. Increasing risk for both hypoglycemia and hyperglycemia.  3. Steroids. Complicating glycemic control and increasing risk for hyperglycemia.  4. Transplant. Complicating glycemic control and increasing risk for hyperglycemia.  5. Chronic Kidney Disease. Complicating glycemic control and increasing risk for hypoglycemia.        Studies reviewed 07/17/24:  Labs: CBC, BMP, POCT-BG, and HbA1C  Interpretation: +Leukocytosis. Hyperkalemia. Elevated Cr in the setting of known CKD. Intermittent hyperglycemia. A1C 6.8% indicates excellent control outpatient.  Notes reviewed: Primary team and nursing notes      Overall impression based on above reviews and history:  Hyperglycemia in patient with known type 2 diabetes complicated by steroids. BG doing well overall with recent revisions. Continue as is for today. Steroids tapering as below.       Steroid taper:  12/7: dexamethasone  4mg  q6h  12/9: dexamethasone  2mg  q6h  12/12: dexamethasone  2mg  q8h  12/14: dexamethasone  2mg  q12h  12/16: dexamethasone  1mg  q12h    Recommendations:  - NPH 7u q12h  - lispro 8u qAC  - lispro target 140, ISF 20 achs  - Hypoglycemia protocol.  - POCT-BG achs.  - Ensure patient is on glucose precautions if patient taking nutrition by mouth.     Discharge planning:  In process. Will complete actual plan closer to discharge.   Anticipate discharging back on home regimen. May depend on steroids.     Thank you for this consult. Discussed plan with primary team. We will continue to follow and make recommendations and place orders as appropriate.    Please page with questions or concerns: Safiyyah Vasconez, NP: [phone]  DCT on call from 6AM - 3PM on weekdays then endocrine fellow on call: [phone] from 3PM - 6AM on weekdays and on weekends and holidays.   If APP cannot be reached, please page the endocrine fellow on call.      Subjective:  Interval history: Reports decent po intake. Denies complaints at time of rounds.     Initial HPI:  Frank Leach is a 73 y.o. 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     Diabetes History:  Patient has a history of Type 2 diabetes diagnosed at least 15+ years ago.  Diabetes is managed by: PCP, nephrologist.  Current home diabetes regimen: metformin  XR 500mg  qAM,empagliflozin  25mg  qam.  Current home blood glucose monitoring:  occasionally.  Hypoglycemia awareness: yes.  Complications related to diabetes: ESRD s/p transplant in 2020      Current Nutrition:  Active Orders   Diet    Nutrition Therapy Regular/House       ROS: As per HPI.    Scheduled Medications[1]    Current Outpatient Medications   Medication Instructions    acetaminophen  (TYLENOL ) 500-1,000 mg, Oral, Every 6 hours PRN  allopurinol  (ZYLOPRIM ) 100 mg, Oral, Every evening    [Paused] amlodipine  (NORVASC ) 10 mg, Oral, Daily (standard)    atorvastatin  (LIPITOR ) 40 mg, Oral, Daily (standard)    dexAMETHasone  (DECADRON ) 1 MG tablet Take 4 tablets (4 mg total) by mouth every twelve (12) hours for 3 days, THEN 4 tablets (4 mg total) daily for 3 days, THEN 2 tablets (2 mg total) daily for 3 days, THEN 1 tablet (1 mg total) daily for 3 days.    ENVARSUS  XR 4 mg, Oral, Daily (standard), Take in addition to ONE 1 mg tablets for a total daily dose of 5 mg.    latanoprost (XALATAN) 0.005 % ophthalmic solution 1 drop, Nightly    magnesium  oxide-Mg AA chelate (MAGNESIUM , AMINO ACID CHELATE,) 133 mg 1 tablet, Oral, 2 times a day    metFORMIN  (GLUCOPHAGE -XR) 500 mg, Oral, Daily (RT)    [Paused] mycophenolate  (MYFORTIC ) 360 mg, Oral, 2 times a day (standard)    [Paused] olmesartan  (BENICAR ) 40 mg, Oral, Daily (standard)    [START ON 07/20/2024] predniSONE  (DELTASONE ) 5 mg, Oral, Daily (standard)    tacrolimus  (ENVARSUS  XR) 1 mg Tb24 extended release tablet 1 tablet, Daily           Past Medical History[2]    Past Surgical History[3]    Family History[4]    Short Social History[5]    OBJECTIVE:  BP 115/70  - Pulse 60  - Temp 36.5 ??C (97.7 ??F) (Oral)  - Resp 18  - Ht 170.2 cm (5' 7.01)  - Wt 73 kg (160 lb 15 oz)  - SpO2 95%  - BMI 25.20 kg/m??   Wt Readings from Last 12 Encounters:   07/13/24 73 kg (160 lb 15 oz)   07/03/24 72.6 kg (160 lb)   01/18/24 73.2 kg (161 lb 6.4 oz)   06/08/23 68.9 kg (151 lb 12.8 oz)   03/22/23 68.3 kg (150 lb 8 oz)   02/14/23 68.4 kg (150 lb 12.8 oz)   12/21/22 68 kg (149 lb 14.4 oz)   11/27/22 67.6 kg (149 lb)   11/02/22 68.1 kg (150 lb 3.2 oz)   10/13/22 68.2 kg (150 lb 6.4 oz)   06/02/22 67 kg (147 lb 9.6 oz)   02/08/22 67.4 kg (148 lb 9.6 oz)     Physical Exam  Vitals and nursing note reviewed.   Constitutional:       General: He is not in acute distress.     Appearance: Normal appearance.   Pulmonary:      Effort: Pulmonary effort is normal. No respiratory distress.   Skin:     General: Skin is warm and dry.   Neurological:      General: No focal deficit present.      Mental Status: He is alert and oriented to person, place, and time.   Psychiatric:         Mood and Affect: Mood normal.         Behavior: Behavior normal.         BG/insulin  reviewed per EMR.   Glucose, POC   Date Value   07/17/2024 126 mg/dL   87/89/7974 828 mg/dL   87/89/7974 824 mg/dL   87/89/7974 846 mg/dL   87/89/7974 883 mg/dL   87/90/7974 754 mg/dL (H)   87/90/7974 843 mg/dL   87/90/7974 857 mg/dL   89/76/7986 784 MG/DL (H)   90/74/7986 55 MG/DL (L)   90/74/7986 52 MG/DL (L)   93/78/7986 96 MG/DL   93/78/7986  155 MG/DL   93/78/7986 877 MG/DL   93/79/7986 768 MG/DL (H)   93/79/7986 91 MG/DL        Summary of labs:  Lab Results   Component Value Date    A1C 6.8 (H) 07/03/2024    A1C 6.2 (H) 01/07/2024    A1C 6.0 (H) 09/10/2023     Lab Results   Component Value Date    GFR 5.11 (L) 05/01/2012    CREATININE 1.72 (H) 07/17/2024     Lab Results   Component Value Date    WBC 20.3 (H) 07/17/2024    HGB 12.6 (L) 07/17/2024    HCT 39.0 07/17/2024    PLT 261 07/17/2024       Lab Results   Component Value Date    NA 147 (H) 07/17/2024    K 4.7 07/17/2024    CL 109 (H) 07/17/2024    CO2 26.0 07/17/2024    BUN 36 (H) 07/17/2024    CREATININE 1.72 (H) 07/17/2024    GLU 166 07/17/2024    CALCIUM 10.1 07/17/2024    MG 2.0 07/14/2024    PHOS 3.8 07/17/2024       Lab Results   Component Value Date    BILITOT 0.2 (L) 07/03/2024    BILIDIR 0.20 12/10/2023    PROT 7.2 07/03/2024    ALBUMIN 3.1 (L) 07/17/2024    ALT 17 07/03/2024    AST 20 07/03/2024    ALKPHOS 78 07/03/2024    GGT 20 06/21/2019                           [1]    [Provider Hold] amlodipine   10 mg Oral Daily    atorvastatin   40 mg Oral Daily    dexAMETHasone   2 mg Oral Q6H SCH    Followed by    NOREEN ON 07/18/2024] dexAMETHasone   2 mg Oral Q8H SCH    Followed by    NOREEN ON 07/20/2024] dexAMETHasone   2 mg Oral Q12H SCH    Followed by    NOREEN ON 07/22/2024] dexAMETHasone   1 mg Oral Q12H SCH    famotidine   20 mg Oral Daily    heparin  (porcine) for subcutaneous use  5,000 Units Subcutaneous Q8H SCH    insulin  lispro  8 Units Subcutaneous 3xd Meals    insulin  lispro  0-20 Units Subcutaneous ACHS    insulin  NPH  7 Units Subcutaneous Q12H SCH    levETIRAcetam   500 mg Oral BID    senna  2 tablet Oral Nightly    tacrolimus   6 mg Oral Daily    valsartan   160 mg Oral Daily   [2]   Past Medical History:  Diagnosis Date    Anemia     Cancer    (CMS-HCC)     clear cell adenocarcinoma of left kidney    Diabetes mellitus (CMS-HCC)     Diabetic nephropathy (CMS-HCC)     ESRD (end stage renal disease) on dialysis    (CMS-HCC)     ESRD on dialysis (CMS-HCC)     Gout     Hyperlipidemia     Hypertension    [3]   Past Surgical History:  Procedure Laterality Date    AV FISTULA PLACEMENT      PR COLONOSCOPY FLX DX W/COLLJ SPEC WHEN PFRMD N/A 04/30/2018    Procedure: COLONOSCOPY, FLEXIBLE, PROXIMAL TO SPLENIC FLEXURE; DIAGNOSTIC, W/WO COLLECTION SPECIMEN BY BRUSH OR WASH;  Surgeon:  Dorn Lynwood Lauth, MD;  Location[facility];  Service: Gastroenterology    PR COLONOSCOPY W/BIOPSY SINGLE/MULTIPLE N/A 04/30/2018    Procedure: COLONOSCOPY, FLEXIBLE, PROXIMAL TO SPLENIC FLEXURE; WITH BIOPSY, SINGLE OR MULTIPLE;  Surgeon: Dorn Lynwood Lauth, MD;  Location: [facility] PROCED[facility];  Service: Gastroenterology    PR COLSC FLX W/RMVL OF TUMOR POLYP LESION SNARE TQ N/A 04/30/2018    Procedure: COLONOSCOPY FLEX; W/REMOV TUMOR/LES BY SNARE;  Surgeon: Dorn Lynwood Lauth, MD;  Location: [facility];  Service: Gastroenterology    PR EXPLORATORY OF ABDOMEN Midline 04/14/2016    Procedure: EXPLORATORY LAPAROTOMY, EXPLORATORY CELIOTOMY WITH OR WITHOUT BIOPSY(S);  Surgeon: Marsa Sam Boss, MD;  Location: MAIN OR [facility];  Service: Transplant    PR EXPLORATORY OF ABDOMEN N/A 04/16/2016    Procedure: EXPLORATORY LAPAROTOMY, EXPLORATORY CELIOTOMY WITH OR WITHOUT BIOPSY(S);  Surgeon: Marsa Sam Boss, MD;  Location: MAIN OR [facility];  Service: Transplant    PR LAP, RADICAL NEPHRECTOMY Right 12/27/2016    Procedure: Robotic Xi Laparoscopy; Radical Nephrectomy (Incl Remove Gerota`S Fascia, Fatty Tissue, Reg Lymph Node, Adrenalectomy);  Surgeon: Clorinda Ole Chalk, MD;  Location: MAIN OR [facility];  Service: Urology    PR LIGATN ANGIOACCESS AV FISTULA Left 11/29/2022    Procedure: LIGATION OR BANDING OF ANGIOACCESS ARTERIOVENOUS FISTULA;  Surgeon: Tol[place], Marsa Sam, MD;  Location: MAIN OR [place];  Service: Transplant    PR NEGATIVE PRESSURE WOUND THERAPY DME >50 SQ CM N/A 04/12/2016    Procedure: NEG PRESS WOUND TX (VAC ASSIST) INCL TOPICALS, PER SESSION, TSA GREATER THAN/= 50 CM SQUARED;  Surgeon: Marsa Sam Boss, MD;  Location: MAIN OR [place];  Service: Transplant    PR NEGATIVE PRESSURE WOUND THERAPY DME >50 SQ CM Bilateral 04/14/2016    Procedure: NEG PRESS WOUND TX (VAC ASSIST) INCL TOPICALS, PER SESSION, TSA GREATER THAN/= 50 CM SQUARED;  Surgeon: Marsa Sam Boss, MD;  Location: MAIN OR ;  Service: Transplant    PR NEGATIVE PRESSURE WOUND THERAPY DME >50 SQ CM N/A 04/16/2016    Procedure: NEG PRESS WOUND TX (VAC ASSIST) INCL TOPICALS, PER SESSION, TSA GREATER THAN/= 50 CM SQUARED;  Surgeon: Marsa Sam Boss, MD;  Location: MA[facility] OR [facility];  Service: Transplant    PR NEPHRECTOMY, W/PART. URETECTOMY Bilateral 04/11/2016    Procedure: LAPAROSCOPY, SURGICAL, NEPHRECTOMY WITH TOTAL URETERECTOMY;  Surgeon: Marsa Sam Boss, MD;  Location: MAIN OR [facility];  Service: Transplant    PR REMV KIDNEY,W/RIB RESECTION Bilateral 04/11/2016    Procedure: NEPHRECTOMY, INCLUDING PARTIAL URETERECTOMY, ANY OPEN APPROACH INCLUDING RIB RESECTION;  Surgeon: Marsa Sam Boss, MD;  Location: MAIN OR [facility];  Service: Transplant    PR TRANSPLANT,PREP LIVING  RENAL GRAFT N/A 06/22/2019    Procedure: BACKBENCH STD PREP LIVING DONOR RENAL ALLGRFT (OPEN/LAPROSC) PRIOR TO TRANSPLANT, INC DISSECT/REM AS NECESS;  Surgeon: Marsa Sam Boss, MD;  Location: MAIN OR [facility];  Service: Transplant    PR TRANSPLANTATION OF KIDNEY N/A 06/22/2019    Procedure: RENAL ALLOTRANSPLANTATION, IMPLANTATION OF GRAFT; WITHOUT RECIPIENT NEPHRECTOMY;  Surgeon: Marsa Sam Boss, MD;  Location: MAIN OR [facility];  Service: Transplant    SPLENECTOMY     [4]   Family History  Problem Relation Age of Onset    Kidney disease Mother Cancer Father     Kidney disease Brother    [5]   Social History  Tobacco Use    Smoking status: Never    Smokeless tobacco: Never   Vaping Use    Vaping  status: Never Used   Substance Use Topics    Alcohol use: No    Drug use: No

## 2024-07-17 NOTE — Plan of Care (Signed)
 Shift Summary  Correctional insulin  administered for elevated blood glucose.   Surgical site and peripheral IV sites remained clean, dry, and intact with no complications.   Fall prevention measures and safety interventions were maintained throughout the shift.   Family was present and updated, and patient remained oriented and cognitively appropriate.   Overall, patient maintained stable neurological and skin status with no significant changes during the shift.     Optimal Comfort and Wellbeing: Pain remained at 0 throughout the shift, and no interventions were required or requested for pain management. Blood glucose was elevated at 171 mg/dL and correctional insulin  was administered as ordered.     Readiness for Transition of Care: Orientation and cognition remained stable and appropriate, and family was present and updated throughout the shift.     Absence of Fall and Fall-Related Injury: Fall reduction measures were maintained, including bed alarm, low bed, and hourly visual checks, with no reported falls or injuries.     Skin Health and Integrity: Surgical site on the scalp remained clean, dry, and intact with no odor or drainage, and pressure reduction techniques were encouraged. Peripheral IV sites were also clean, dry, and intact with no interventions needed.     Optimal Functional Ability: Mobility was slightly limited, and right foot dorsiflexion remained weak, but motor function and sensation were preserved. Anti-embolism devices were in place on bilateral lower extremities.

## 2024-07-18 DIAGNOSIS — C719 Malignant neoplasm of brain, unspecified: Principal | ICD-10-CM

## 2024-07-18 LAB — TACROLIMUS LEVEL, TROUGH: TACROLIMUS, TROUGH: 2.6 ng/mL — ABNORMAL LOW (ref 5.0–15.0)

## 2024-07-18 MED ORDER — TEMOZOLOMIDE 140 MG CAPSULE
ORAL_CAPSULE | Freq: Every day | ORAL | 0 refills | 21.00000 days | Status: CP
Start: 2024-07-18 — End: 2025-07-18
  Filled 2024-08-01: qty 21, 21d supply, fill #0

## 2024-07-18 MED ORDER — ONDANSETRON HCL 8 MG TABLET
ORAL_TABLET | Freq: Three times a day (TID) | ORAL | 1 refills | 7.00000 days | Status: CP | PRN
Start: 2024-07-18 — End: 2025-07-18

## 2024-07-18 MED ADMIN — tacrolimus (ENVARSUS XR) extended release tablet 6 mg: 6 mg | ORAL | @ 13:00:00

## 2024-07-18 MED ADMIN — heparin (porcine) 5,000 unit/mL injection 5,000 Units: 5000 [IU] | SUBCUTANEOUS | @ 02:00:00

## 2024-07-18 MED ADMIN — heparin (porcine) 5,000 unit/mL injection 5,000 Units: 5000 [IU] | SUBCUTANEOUS | @ 21:00:00

## 2024-07-18 MED ADMIN — heparin (porcine) 5,000 unit/mL injection 5,000 Units: 5000 [IU] | SUBCUTANEOUS | @ 12:00:00

## 2024-07-18 MED ADMIN — famotidine (PEPCID) tablet 20 mg: 20 mg | ORAL | @ 13:00:00

## 2024-07-18 MED ADMIN — dexAMETHasone (DECADRON) tablet 2 mg: 2 mg | ORAL | @ 05:00:00 | Stop: 2024-07-18

## 2024-07-18 MED ADMIN — dexAMETHasone (DECADRON) tablet 2 mg: 2 mg | ORAL | @ 11:00:00 | Stop: 2024-07-18

## 2024-07-18 MED ADMIN — insulin lispro (HumaLOG) injection CORRECTIONAL 0-20 Units: 0-20 [IU] | SUBCUTANEOUS | @ 22:00:00

## 2024-07-18 MED ADMIN — insulin lispro (HumaLOG) inj PERCENTAGE MEAL EATEN 6 Units: 6 [IU] | SUBCUTANEOUS

## 2024-07-18 MED ADMIN — levETIRAcetam (KEPPRA) tablet 500 mg: 500 mg | ORAL | @ 02:00:00 | Stop: 2024-07-17

## 2024-07-18 MED ADMIN — atorvastatin (LIPITOR) tablet 40 mg: 40 mg | ORAL | @ 13:00:00

## 2024-07-18 MED ADMIN — dexAMETHasone (DECADRON) tablet 2 mg: 2 mg | ORAL | @ 21:00:00 | Stop: 2024-07-20

## 2024-07-18 MED ADMIN — insulin NPH (HumuLIN,NovoLIN) injection 7 Units: 7 [IU] | SUBCUTANEOUS | @ 13:00:00 | Stop: 2024-07-18

## 2024-07-18 MED ADMIN — insulin NPH (HumuLIN,NovoLIN) injection 7 Units: 7 [IU] | SUBCUTANEOUS | @ 02:00:00

## 2024-07-18 MED ADMIN — valsartan (DIOVAN) tablet 160 mg: 160 mg | ORAL | @ 13:00:00

## 2024-07-18 MED ADMIN — insulin lispro (HumaLOG) inj PERCENTAGE MEAL EATEN 8 Units: 8 [IU] | SUBCUTANEOUS | @ 13:00:00 | Stop: 2024-07-18

## 2024-07-18 NOTE — Progress Notes (Signed)
 Sent TMZ 75 mg/m2 and ondansetron  after discussing with Dr. Denis.     Reviewed past lab.    Annye Merles PharmD, BCOP, CPP  Neuro-Oncology Clinic Pharmacist

## 2024-07-18 NOTE — Progress Notes (Signed)
 [facility]   INPATIENT PROGRESS NOTE      Brief History of Present Illness  Frank Leach is a 73 y.o. male with RCC/renal transplant now found to have a left frontal brain mass now s/p left craniotomy for resection (12/5).    Subjective/Interval History  CT sim completed yesterday. Pending AIR.     Interval Imaging Reviewed  None    Neurological Assessment and Plan  **Frontal mass of brain  POD7 Left craniotomy for resection of brain mass (12/5)  - SBP < 160; Na > 135  - dex taper to 2mg  BID  - Keppra  500 BID x 7 days  - Continue tacrolimus  per nephro   - HOB > 30  - PT/OT  - Pending AIR    Anticoagulant/Antiplatelet needs: ASA- hold    Disposition: floor status    ___________________________________________________________________    Neurological Exam  EOSp  Ox3  PERRL  EOMI  FS  TML  RUE 4  LUE 5  RLE 4++  LLE 5    Incision c/d/I staples     The patient's vitals, intake/output, labs, orders, and relevant imaging were reviewed for the last 24 hours.    Problem List  Principal Problem:    Frontal mass of brain

## 2024-07-18 NOTE — Consults (Addendum)
 Physical Medicine and Rehabilitation  Inpatient Consult Note    Requesting Attending Physician: Rosaria CHRISTELLA Lush, MD  Service Requesting Consult: Neurosurgery [facility])  Thank you for this consult.  Please contact the PM&R consult pager [phone]) for questions regarding these recommendations.  For questions of bed availability at [facility], contact the Intake Office at [phone]. Please contact your case manager for updates on AIR process as they are in regular contact with the rehab intake office.    ASSESSMENT:   Frank Leach is a 73 y.o. 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    RECOMMENDATIONS:  #R-sided weakness ISO new brain mass  - Steroid taper + AED x 1 week  - SBP <160, Na >135  - Completed light precautions x 48 hours  - HOB >30  - Onc/Rad onc/neurosurgery/urology/palliative care involved  - Utilize resting PRAFO in bed, AFO OOB and resting hand/wrist splints  -  Scheduled for RT x 15 fractions + temozolamide per neuro onc    #RCC/DDKT/HTN  - Transplant nephrology involved: mycophenolate /olmesartan  on hold, tacro, amlodipine  per their recommendations    DISCHARGE RECOMMENDATIONS:  - Recommendation: ACUTE INPATIENT REHABILITATION (AIR).  - Patient/family agreed to [facility] AIR.    FOLLOW UP:  - Please place an Ambulatory referral by entering cancer rehab in Epic and select [facility] as the Department in the PM+R referral to schedule follow up with Dr. Nakayla Rorabaugh.     AIR PLANNING:  *This PM&R consult does not guarantee that patient has been accepted to [facility] AIR, but can be helpful to guide patient progression*    Checklist For Potential Admission to [facility] AIR:  [ ]  EDD/what is estimated discharge date?  [ ]  Referral from CM if wants [place] AIR  [ ]  PT/OT within 48 hours of AIR  [ ]  dispo verified and [place] AIR preference  [ ]  CBC/BMP & other pertinent labs within 48 hours of AIR  [ ]  All SCI patients and those with hypercoagulable state (COVID, transplant, cancer, etc) need PVL BLE within one week prior to AIR  [ ]  Chemotherapy and biologics: Require AIR physician and nursing approval; Oral chemo and some infusions (Cytoxan) are often acceptable (M-F administration secondary to daytime nurse staffing).   The Intake Team will verify staffing availability with Nurse Management; IT chemo cannot be done at [facility] and those patients will need to stay at main until treatments are completed  [ ]  Chemotherapy plan documented  [ ]  Radiation therapy plan documented    Discharge Plan After AIR:  - patient lives with spouse in a 1 level home with 2STE.  - patient will have assistance from spouse after discharge.    In order for the patient to be considered for admission to [facility] inpatient rehab, the case manager must give the patient / family choice in post-acute discharge options. If the patient desires [place], a referral from case management is required for acceptance to [facility] inpatient rehab.  A referral for [facility] inpatient rehab has not been received.     Vena CHARLENA Lolling, MD  [facility] PM+R    SUBJECTIVE   Reason for Consult: Patient seen in consultation at the request of Rosaria CHRISTELLA Lush, MD for evaluation of rehabilitation needs and recommendations.    History of Present Illness: Frank Leach is a 73 y.o. 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    Per review of EMR, he was admitted on 12/5 for the above procedure    Medical Complexity:  #R-sided weakness ISO new brain mass  - Steroid taper + AED x 1 week  - SBP <160, Na >135  - Completed light precautions x 48 hours  - HOB >30    #RCC/DDKT/HTN  - Transplant nephrology involved: mycophenolate /olmesartan  on hold, tacro, amlodipine  per their recommendations    #T2DM  - Home metformin  on hold  - BMP daily  - SSI PRN    # Impaired ADLs, fall risk, impaired functional gait, mobility, balance, and endurance  - Please continue to have patient work with PT and OT to maximize functional status with mobility and ADLs.  - Fall risk precautions and OOB to chair daily with assistance if appropriate  - Discussed rehab options today with patient. The patient/family/caregiver was counseled and educated on the purpose of AIR and questions/concerns were addressed.     Interim History: Last seen on 12/8. Since that time he has undergone RT planning and participated in PT/OT.    Patient was seen with family. He states he is wanting to get stronger.    Past Medical and Surgical History:   Past Medical History[1]  Past Surgical History[2]     Cancer History:  As per HPI      Social History:   Short Social History[3]    Social History     Social History Narrative    Not on file       Living Environment: House  Lives With: Spouse  Home Living: One level home, Tub/shower unit, Hand-held shower hose, Handicapped height toilet, Grab bars in shower, Grab bars around toilet, Stairs to enter with rails, Shower chair without back  Rail placement (outside): Bilateral rails in reach  Number of Stairs to Enter (outside): 2  Rail placement (inside): Rail on right side  Number of Stairs to Alternate level (inside): 2  Caregiver Identified?: Yes  Caregiver Availability: Intermittent  Caregiver Ability: Limited lifting    Family History: Reviewed, noncontributory to rehab  family history includes Cancer in his father; Kidney disease in his brother and mother.    Allergies:   Patient has no known allergies.    Medications:   Scheduled Scheduled meds with Route[4]  PRN acetaminophen , 650 mg, Q6H PRN  dextrose  in water , 12.5 g, Q15 Min PRN  hydrALAZINE , 10 mg, Q4H PRN  labetalol, 10 mg, Q4H PRN  melatonin, 3 mg, Nightly PRN      Continuous Infusions      Review of Systems:    General ROS: negative      Prior Functional Status:  Prior Functional Status: independent at baseline without a device    Current Functional Status: Therapy notes reviewed     Mobility:   Bed Mobility comments: supine to sitting EOB with minA for RLE management, HOB elevated, use of handrail        Ambulation comments: patient ambulated x80 feet, x3 sets with RW; inconsistent R foot clearance  PT Post Acute Discharge Recommendations: 5x weekly, High intensity    Cognition, Swallow, Speech:   Cognition / Swallow / Speech  Patient's Vision Adequate to Safely Complete Daily Activities: Recent Change  Patient's Judgement Adequate to Safely Complete Daily Activities: Recent Change  Patient's Memory Adequate  to Safely Complete Daily Activities: Recent Change  Patient Able to Express Needs/Desires: Yes  Patient has speech problem: No             Assistive Devices: Bedside commode, Rolling walker, Straight cane    Precautions:  Safety Interventions  Safety Interventions: fall reduction program maintained, low bed  Aspiration Precautions: awake/alert before oral intake  Bleeding Precautions: blood pressure closely monitored  Infection Management: aseptic technique maintained  Seizure Precautions: clutter-free environment maintained, activity supervised, emergency equipment at bedside, side rails padded, soft boundaries provided    OBJECTIVE:   Vitals:  Vitals:    07/17/24 1630 07/17/24 1915 07/18/24 0400 07/18/24 0821   BP: 113/71 122/65 123/73 134/67   Pulse: 50 50 51 51   Resp: 18 18 18 18    Temp: 36.5 ??C (97.7 ??F) 36.5 ??C (97.7 ??F) 36.8 ??C (98.2 ??F) 36.6 ??C (97.9 ??F)   TempSrc: Oral Axillary Temporal Temporal   SpO2: 95% 97% 97% 97%   Weight:       Height:           Physical Exam:    GEN: Lying in bed in NAD.  HEENT: Atraumatic. Normocephalic. Moist mucous membranes. Trachea midline.  RESP: NWOB on RA.  CV: Warm and well-perfused.  PSYCH: Appropriate  SKIN: see wound care flow sheet    NEURO:  Mental Status: A&Ox3 (person, place, date), attention intact, speech fluid and coherent, follows commands well and answers questions appropriately  Motor:      Delt Bi Tri HG   (Delt= Deltoid, Bi= Biceps, Tri= Triceps, WE= Wrist extensor, HG= Hand Grip)  RUE 3 4 4 5      LUE 5 5 5 5                    HF KE       DF       PF    (HF= Hip flexor, KE= Knee Extensor, DF= Dorsiflexor, PF= Plantar Flexor)  RLE     4 4 3 3               LLE   5 5 5 5        Labs and Diagnostic Studies:    Personally reviewed   CBC - Results in Past 30 Days  Result Component Current Result Ref Range Previous Result Ref Range   HCT 39.0 (07/17/2024) 39.0 - 48.0 % 36.7 (L) (07/15/2024) 39.0 - 48.0 %   HGB 12.6 (L) (07/17/2024) 12.9 - 16.5 g/dL 88.0 (L) (87/0/7974) 87.0 - 16.5 g/dL   MCH 70.8 (87/88/7974) 25.9 - 32.4 pg 29.1 (07/15/2024) 25.9 - 32.4 pg   MCHC 32.3 (07/17/2024) 32.0 - 36.0 g/dL 67.4 (87/0/7974) 67.9 - 36.0 g/dL   MCV 09.9 (87/88/7974) 77.6 - 95.7 fL 89.4 (07/15/2024) 77.6 - 95.7 fL   MPV 9.4 (07/17/2024) 6.8 - 10.7 fL 8.6 (07/15/2024) 6.8 - 10.7 fL   Platelet 261 (07/17/2024) 150 - 450 10*9/L 239 (07/15/2024) 150 - 450 10*9/L   RBC 4.33 (07/17/2024) 4.26 - 5.60 10*12/L 4.10 (L) (07/15/2024) 4.26 - 5.60 10*12/L   WBC 20.3 (H) (07/17/2024) 3.6 - 11.2 10*9/L 19.1 (H) (07/15/2024) 3.6 - 11.2 10*9/L     BMP - Results in Past 30 Days  Result Component Current Result Ref Range Previous Result Ref Range   BUN 36 (H) (07/17/2024) 9 - 23 mg/dL 36 (H) (87/0/7974) 9 - 23 mg/dL   Chloride 890 (H) (87/88/7974) 98 - 107 mmol/L 106 (07/15/2024) 98 - 107  mmol/L   CO2 26.0 (07/17/2024) 20.0 - 31.0 mmol/L 24.0 (07/15/2024) 20.0 - 31.0 mmol/L   Creatinine 1.72 (H) (07/17/2024) 0.73 - 1.18 mg/dL 8.33 (H) (87/0/7974) 9.26 - 1.18 mg/dL   Glucose 833 (87/88/7974) 70 - 179 mg/dL 748 (H) (87/0/7974) 70 - 179 mg/dL   Potassium 4.7 (87/88/7974) 3.4 - 4.8 mmol/L 4.2 (07/15/2024) 3.4 - 4.8 mmol/L   Sodium 147 (H) (07/17/2024) 135 - 145 mmol/L 141 (07/15/2024) 135 - 145 mmol/L     LFT's - Results in Past 30 Days  Result Component Current Result Ref Range Previous Result Ref Range   Albumin 3.1 (L) (07/17/2024) 3.4 - 5.0 g/dL 2.9 (L) (87/0/7974) 3.4 - 5.0 g/dL   Alkaline Phosphatase 78 (07/03/2024) 46 - 116 U/L Not in Time Range    ALT 17 (07/03/2024) 10 - 49 U/L Not in Time Range    AST 20 (07/03/2024) <=34 U/L Not in Time Range    Total Bilirubin 0.2 (L) (07/03/2024) 0.3 - 1.2 mg/dL Not in Time Range        Radiology Results:    Reviewed MRI brain from 07/12/24 - post-operative changes noted in L frontal lobe; agree with report as below:  Impression   --Postoperative changes of resection of the left frontal mass with small volume subdural and resection bed blood products. Nodular enhancement around the surgical bed along the inferior aspect of the resection bed may represent residual disease.        This encounter was a subsequent hospital care (follow up consultation).          [1]   Past Medical History:  Diagnosis Date    Anemia     Cancer    (CMS-HCC)     clear cell adenocarcinoma of left kidney    Diabetes mellitus (CMS-HCC)     Diabetic nephropathy (CMS-HCC)     ESRD (end stage renal disease) on dialysis    (CMS-HCC)     ESRD on dialysis (CMS-HCC)     Gout     Hyperlipidemia     Hypertension    [2]   Past Surgical History:  Procedure Laterality Date    AV FISTULA PLACEMENT      PR COLONOSCOPY FLX DX W/COLLJ SPEC WHEN PFRMD N/A 04/30/2018    Procedure: COLONOSCOPY, FLEXIBLE, PROXIMAL TO SPLENIC FLEXURE; DIAGNOSTIC, W/WO COLLECTION SPECIMEN BY BRUSH OR WASH;  Surgeon: Dorn Lynwood Lauth, MD;  Location: [facility] PROCED[facility];  Service: Gastroenterology    PR COLONOSCOPY W/BIOPSY SINGLE/MULTIPLE N/A 04/30/2018    Procedure: COLONOSCOPY, FLEXIBLE, PROXIMAL TO SPLENIC FLEXURE; WITH BIOPSY, SINGLE OR MULTIPLE;  Surgeon: Dorn Lynwood Lauth, MD;  Location: [facility] PROCEDURES [facility];  Service: Gastroenterology    PR COLSC FLX W/RMVL OF TUMOR POLYP LESION SNARE TQ N/A 04/30/2018    Procedure: COLONOSCOPY FLEX; W/REMOV TUMOR/LES BY SNARE;  Surgeon: Dorn Lynwood Lauth, MD;  Location: [facility]ED[facility];  Service: Gastroenterology    PR EXCIS SUPRATENT BRAIN TUMOR Left 07/11/2024    Procedure: Left craniotomy for resection of mass;  Surgeon: Zettie Rosaria HERO, MD;  Location: OR [facility];  Service: Neurosurgery    PR EXPLORATORY OF ABDOMEN Midline 04/14/2016    Procedure: EXPLORATORY LAPAROTOMY, EXPLORATORY CELIOTOMY WITH OR WITHOUT BIOPSY(S);  Surgeon: Marsa Sam Boss, MD;  Location: MAIN OR [facility];  Service: Transplant    PR EXPLORATORY OF ABDOMEN N/A 04/16/2016    Procedure: EXPLORATORY LAPAROTOMY, EXPLORATORY CELIOTOMY WITH OR WITHOUT BIOPSY(S);  Surgeon: Marsa Sam Boss, MD;  Location:  MAIN OR [place];  Service: Transplant    PR IONM 1 ON 1 IN OR W/ATTENDANCE EACH 15 MINUTES N/A 07/11/2024    Procedure: CONTINUOUS INTRAOPERATIVE NEUROPHYSIOLOGY MONITORING IN OR;  Surgeon: Zettie Rosaria HERO, MD;  Location: OR [facility];  Service: Neurosurgery    PR LAP, RADICAL NEPHRECTOMY Right 12/27/2016    Procedure: Robotic Xi Laparoscopy; Radical Nephrectomy (Incl Remove Gerota`S Fascia, Fatty Tissue, Reg Lymph Node, Adrenalectomy);  Surgeon: Clorinda Ole Chalk, MD;  Location: MAIN OR [facility];  Service: Urology    PR LIGATN ANGIOACCESS AV FISTULA Left 11/29/2022    Procedure: LIGATION OR BANDING OF ANGIOACCESS ARTERIOVENOUS FISTULA;  Surgeon: [place], Marsa Messier, MD;  Location: MAIN OR [facility];  Service: Transplant    PR MICROSURG TECHNIQUES,REQ OPER MICROSCOPE N/A 07/11/2024    Procedure: MICROSURGICAL TECHNIQUES, REQUIRING USE OF OPERATING MICROSCOPE (LIST SEPARATELY IN ADDITION TO CODE FOR PRIMARY PROCEDURE);  Surgeon: Zettie Rosaria HERO, MD;  Location: OR [facility];  Service: Neurosurgery    PR NEGATIVE PRESSURE WOUND THERAPY DME >50 SQ CM N/A 04/12/2016    Procedure: NEG PRESS WOUND TX (VAC ASSIST) INCL TOPICALS, PER SESSION, TSA GREATER THAN/= 50 CM SQUARED;  Surgeon: Marsa Messier Boss, MD;  Location: MAIN OR [place];  Service: Transplant    PR NEGATIVE PRESSURE WOUND THERAPY DME >50 SQ CM Bilateral 04/14/2016    Procedure: NEG PRESS WOUND TX (VAC ASSIST) INCL TOPICALS, PER SESSION, TSA GREATER THAN/= 50 CM SQUARED;  Surgeon: Marsa Messier Boss, MD;  Location: MAIN OR [place];  Service: Transplant    PR NEGATIVE PRESSURE WOUND THERAPY DME >50 SQ CM N/A 04/16/2016    Procedure: NEG PRESS WOUND TX (VAC ASSIST) INCL TOPICALS, PER SESSION, TSA GREATER THAN/= 50 CM SQUARED;  Surgeon: Marsa Messier Boss, MD;  Location: MAIN OR [facility];  Service: Transplant    PR NEPHRECTOMY, W/PART. URETECTOMY Bilateral 04/11/2016    Procedure: LAPAROSCOPY, SURGICAL, NEPHRECTOMY WITH TOTAL URETERECTOMY;  Surgeon: Marsa Messier Boss, MD;  Location: MAIN OR [facility];  Service: Transplant    PR REMV KIDNEY,W/RIB RESECTION Bilateral 04/11/2016    Procedure: NEPHRECTOMY, INCLUDING PARTIAL URETERECTOMY, ANY OPEN APPROACH INCLUDING RIB RESECTION;  Surgeon: Marsa Messier Boss, MD;  Location: MAIN OR [facility];  Service: Transplant    PR STEREOTACTIC COMP ASSIST PROC,CRANIAL,INTRADURAL N/A 07/11/2024    Procedure: STEREOTACTIC COMPUTER-ASSISTED (NAVIGATIONAL) PROCEDURE; CRANIAL, INTRADURAL;  Surgeon: Zettie Rosaria HERO, MD;  Location: OR [facility];  Service: Neurosurgery    PR TRANSPLANT,PREP LIVING  RENAL GRAFT N/A 06/22/2019    Procedure: [facility] STD PREP LIVING DONOR RENAL ALLGRFT (OPEN/LAPROSC) PRIOR TO TRANSPLANT, INC DISSECT/REM AS NECESS;  Surgeon: Marsa Messier Boss, MD;  Location: MAIN OR [facility];  Service: Transplant    PR TRANSPLANTATION OF KIDNEY N/A 06/22/2019    Procedure: RENAL ALLOTRANSPLANTATION, IMPLANTATION OF GRAFT; WITHOUT RECIPIENT NEPHRECTOMY;  Surgeon: Marsa Messier Boss, MD;  Location: MAIN OR [facility];  Service: Transplant    SPLENECTOMY     [3]   Social History  Tobacco Use    Smoking status: Never    Smokeless tobacco: Never   Vaping Use    Vaping status: Never Used   Substance Use Topics    Alcohol use: No    Drug use: No   [4]    [Provider Hold] amlodipine  (NORVASC ) tablet 10 mg Daily    atorvastatin  (LIPITOR ) tablet 40 mg Daily    famotidine  (PEPCID ) tablet 20 mg Daily    heparin  (porcine) 5,000 unit/mL injection 5,000 Units Q8H SCH    insulin  lispro (HumaLOG ) inj PERCENTAGE MEAL  EATEN 6 Units 3xd Meals    insulin  lispro (HumaLOG ) injection CORRECTIONAL 0-20 Units ACHS    senna (SENOKOT) tablet 2 tablet Nightly    tacrolimus  (ENVARSUS  XR) extended release tablet 6 mg Daily    valsartan  (DIOVAN ) tablet 160 mg Daily

## 2024-07-18 NOTE — Consults (Signed)
 Transplant Nephrology Consult     Requesting Attending Physician :  Rosaria CHRISTELLA Lush, MD  Service Requesting Consult : Neurosurgery [facility])  Reason for Consult: kidney transplant recipient, assistance with immunosuppression management    Assessment and Plan:     # S/p Kidney Transplant, Kidney allograft function (stable):  - Serum creatinine remains within baseline of approximately 1.7-1.9.    # Immunosuppression:  - Off mycophenolate  in setting of malignancy   - Envarsus  increased to 6 mg daily (i12/10) in setting of lower tac troughs  - Please obtain trough tacrolimus  trough levels prior to the morning dose of the medication and approximately 24hrs after prior dose. The goal tacrolimus  level is 4-6 ng/mL. Level for today is pending. Given recent dose change, would continue same dose.   - Once the patient has completed his course of dexamethasone , would start prednisone  5 mg daily.    # Blood Pressure / Volume:  - Relatively normotensive  - Currently on valsartan  160 mg daily    # Bladder mass  - Plan for outpatient cystoscopy with urology    # Frontal mass of brain s/p left craniotomy for resection of brain mass 12/5  - Management per primary team    RECOMMENDATIONS:   - No changes to current immunosuppression  - Transplant patients with an open wound require wound care with sterile water  only. The patient should be counseled on this at the time of discharge if they have not already been doing this.  - We will continue to follow.     Marcellus MARLA Kitty, MD  07/18/2024 10:01 AM     Medical decision-making for 07/18/24  Findings / Data     Patient has: []  acute illness w/systemic sxs  [mod]  []  two or more stable chronic illnesses [mod]  []  one chronic illness with acute exacerbation [mod]  []  acute complicated illness  [mod]  []  Undiagnosed new problem with uncertain prognosis  [mod] [x]  illness posing risk to life or bodily function (ex. AKI)  [high]  []  chronic illness with severe exacerbation/progression [high]  []  chronic illness with severe side effects of treatment  [high] Kidney transplant recipient, brain mass Probs At least 2:  Probs, Data, Risk   I reviewed: []  primary team note  []  consultant note(s)  []  external records [x]  chemistry results  [x]  CBC results  []  blood gas results  []  Other []  procedure/op note(s)   [x]  radiology report(s)  []  micro result(s)  []  w/ independent historian(s) Cr at baseline, reviewed primary team notes  >=3 Data Review (2 of 3)    I independently interpreted: []  Urine Sediment  []  Renal US  []  CXR Images  []  CT Images  []  Other []  EKG Tracing  Any     I discussed: []  Pathology results w/ QHPs(s) from other specialties  []  Procedural findings w/ QHPs(s) from other specialties []  Imaging w/ QHP(s) from other specialties  [x]  Treatment plan w/ QHP(s) from other specialties Plan discussed with primary team Any     Mgm't requires: []  Prescription drug(s)  [mod]  []  Kidney biopsy  [mod]  []  Central line placement  [mod] [x]  High risk medication use and/or intensive toxicity monitoring [high]  []  Renal replacement therapy [high]  []  High risk kidney biopsy  [high]  []  Escalation of care  [high]  []  High risk central line placement  [high] Immunosuppression: high risk for infection Risk      _____________________________________________________________________________________      Transplant Background  Date  of Transplant: 06/22/2019 (Kidney)  Organ Received: deceased donor kidney transplant, KDPI 42%  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 (left 04/2016 and right 12/2016)  Post-Transplant Course: HD once for hyperkalemia early after transplant  Prior Transplants: none  Induction: alemtuzumab  Date of Ureteral Stent Removal: 07/29/2019  CMV and EBV Serologies: CMV D+/R+, EBV D+/R  Rejection Episodes: 01/2022 kidney biopsy showed mild focal tubulitis, patien twas treated with solumedrol 125 mg IV x3 and steroid taper  Donor Specific Antibodies: none    Interval events: No acute events overnight. Doing well this AM. Awaiting AIR.     INPATIENT MEDICATIONS:  Current Medications[1]    Physical Exam:   Vitals:    07/17/24 1630 07/17/24 1915 07/18/24 0400 07/18/24 0821   BP: 113/71 122/65 123/73 134/67   Pulse: 50 50 51 51   Resp: 18 18 18 18    Temp: 36.5 ??C (97.7 ??F) 36.5 ??C (97.7 ??F) 36.8 ??C (98.2 ??F) 36.6 ??C (97.9 ??F)   TempSrc: Oral Axillary Temporal Temporal   SpO2: 95% 97% 97% 97%   Weight:       Height:         No intake/output data recorded.    Intake/Output Summary (Last 24 hours) at 07/18/2024 1001  Last data filed at 07/18/2024 0630  Gross per 24 hour   Intake --   Output 1000 ml   Net -1000 ml         Constitutional: no acute distress, resting in bed  Heart: regular rate and rhythm  Lungs: normal WOB, on room air  Abd: soft, non-distended  Ext: no lower extremity edema    Neuro: awake, alert               [1]   Current Facility-Administered Medications:     acetaminophen  (TYLENOL ) tablet 650 mg, Oral, Q6H PRN    [Provider Hold] amlodipine  (NORVASC ) tablet 10 mg, Oral, Daily    atorvastatin  (LIPITOR ) tablet 40 mg, Oral, Daily    [COMPLETED] dexAMETHasone  (DECADRON ) tablet 4 mg, Oral, Q6H SCH **FOLLOWED BY** [COMPLETED] dexAMETHasone  (DECADRON ) tablet 2 mg, Oral, Q6H SCH **FOLLOWED BY** dexAMETHasone  (DECADRON ) tablet 2 mg, Oral, Q8H SCH **FOLLOWED BY** [START ON 07/20/2024] dexAMETHasone  (DECADRON ) tablet 2 mg, Oral, Q12H SCH **FOLLOWED BY** [START ON 07/22/2024] dexAMETHasone  (DECADRON ) tablet 1 mg, Oral, Q12H SCH    dextrose  50 % in water  (D50W) 50 % solution 12.5 g, Intravenous, Q15 Min PRN    famotidine  (PEPCID ) tablet 20 mg, Oral, Daily    heparin  (porcine) 5,000 unit/mL injection 5,000 Units, Subcutaneous, Q8H SCH    hydrALAZINE  (APRESOLINE ) injection 10 mg, Intravenous, Q4H PRN    insulin  lispro (HumaLOG ) inj PERCENTAGE MEAL EATEN 6 Units, Subcutaneous, 3xd Meals    insulin  lispro (HumaLOG ) injection CORRECTIONAL 0-20 Units, Subcutaneous, ACHS    insulin  NPH (HumuLIN ,NovoLIN ) injection 5 Units, Subcutaneous, Q12H SCH    labetalol (NORMODYNE) injection, Intravenous, Q4H PRN    melatonin tablet 3 mg, Oral, Nightly PRN    senna (SENOKOT) tablet 2 tablet, Oral, Nightly    tacrolimus  (ENVARSUS  XR) extended release tablet 6 mg, Oral, Daily    valsartan  (DIOVAN ) tablet 160 mg, Oral, Daily

## 2024-07-18 NOTE — Plan of Care (Signed)
 Shift Summary  Correctional insulin  was administered in response to elevated blood glucose in the late afternoon.     Frequent repositioning, pressure reduction, and fall prevention interventions were maintained throughout the shift.    Moderate to minimal assistance was required for mobility and ADLs, with some improvement in ambulation assistance noted.    Overall, no new complications or injuries were documented, and comfort was maintained.   Patient ambulatory with assistance. Patient voiding well.     Absence of Hospital-Acquired Illness or Injury: No new hospital-acquired illnesses or injuries were documented during the shift; fall reduction and pressure reduction interventions were consistently maintained, and surgical sites remained clean, dry, and intact.     Optimal Comfort and Wellbeing: Pain remained at 0/10 throughout the shift, and no discomfort was reported during activities or interventions.     Absence of Fall and Fall-Related Injury: Fall reduction program, bed alarms, and non-skid footwear were maintained at all times, and hourly visual checks were performed; no falls or injuries occurred.     Skin Health and Integrity: Surgical sites were open to air with clean, dry, intact peri-wound skin and attached margins; frequent repositioning, pressure-redistributing mattress, and absorbent pads were used throughout the shift.     Improved Ability to Complete Activities of Daily Living: Moderate to minimal assistance was required for ambulation and hygiene, with some improvement to minimal assist noted during the shift; bathing and toileting continued to require significant assistance.

## 2024-07-18 NOTE — Consults (Signed)
 Endocrine Team Diabetes Follow Up Consult Note     Consult information:  Requesting Attending Physician : Rosaria CHRISTELLA Lush, MD  Service Requesting Consult : Neurosurgery [facility])  Primary Care Provider: Kandis Stefano [place], MD  Impression:  Frank Leach is a 73 y.o. male admitted for brain lesion. We have been consulted at the request of Rosaria CHRISTELLA Lush, MD to evaluate Frank Leach for hyperglycemia.     Medical Decision Making:  Diagnoses:  1.Type 2 Diabetes. Uncontrolled With hyperglycemia.  2. Nutrition: Complicating glycemic control. Increasing risk for both hypoglycemia and hyperglycemia.  3. Steroids. Complicating glycemic control and increasing risk for hyperglycemia.  4. Transplant. Complicating glycemic control and increasing risk for hyperglycemia.  5. Chronic Kidney Disease. Complicating glycemic control and increasing risk for hypoglycemia.        Studies reviewed 07/18/24:  Labs: CBC, BMP, POCT-BG, and HbA1C  Interpretation: +Leukocytosis. Hyperkalemia. Elevated Cr in the setting of known CKD. Intermittent hyperglycemia. A1C 6.8% indicates excellent control outpatient.  Notes reviewed: Primary team and nursing notes      Overall impression based on above reviews and history:  Hyperglycemia in patient with known type 2 diabetes complicated by steroids. BG doing well overal however as steroids tapering and BG trending below inpatient target will reduce doses by about 20%.      Steroid taper:  12/7: dexamethasone  4mg  q6h  12/9: dexamethasone  2mg  q6h  12/12: dexamethasone  2mg  q8h  12/14: dexamethasone  2mg  q12h  12/16: dexamethasone  1mg  q12h    Recommendations:  - decrease NPH from 7u to 5u q12h  - decrease lispro from 8u to 6u qAC  - lispro target 140, ISF 20 achs  - Hypoglycemia protocol.  - POCT-BG achs.  - Ensure patient is on glucose precautions if patient taking nutrition by mouth.     Discharge planning:  In process. Will complete actual plan closer to discharge.   Anticipate discharging back on home regimen. May depend on steroids.     Thank you for this consult. Discussed plan with primary team. We will continue to follow and make recommendations and place orders as appropriate.    Please page with questions or concerns: Osmar Howton, NP: [phone]  DCT on call from 6AM - 3PM on weekdays then endocrine fellow on call: [phone] from 3PM - 6AM on weekdays and on weekends and holidays.   If APP cannot be reached, please page the endocrine fellow on call.      Subjective:  Interval history: Reports decent po intake. Denies complaints at time of rounds.     Initial HPI:  Frank Leach is a 73 y.o. 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     Diabetes History:  Patient has a history of Type 2 diabetes diagnosed at least 15+ years ago.  Diabetes is managed by: PCP, nephrologist.  Current home diabetes regimen: metformin  XR 500mg  qAM,empagliflozin  25mg  qam.  Current home blood glucose monitoring:  occasionally.  Hypoglycemia awareness: yes.  Complications related to diabetes: ESRD s/p transplant in 2020      Current Nutrition:  Active Orders   Diet    Nutrition Therapy Regular/House       ROS: As per HPI.    Scheduled Medications[1]    Current Outpatient Medications   Medication Instructions    acetaminophen  (  TYLENOL ) 500-1,000 mg, Oral, Every 6 hours PRN    allopurinol  (ZYLOPRIM ) 100 mg, Oral, Every evening    [Paused] amlodipine  (NORVASC ) 10 mg, Oral, Daily (standard)    atorvastatin  (LIPITOR ) 40 mg, Oral, Daily (standard)    dexAMETHasone  (DECADRON ) 1 MG tablet Take 4 tablets (4 mg total) by mouth every twelve (12) hours for 3 days, THEN 4 tablets (4 mg total) daily for 3 days, THEN 2 tablets (2 mg total) daily for 3 days, THEN 1 tablet (1 mg total) daily for 3 days.    ENVARSUS  XR 4 mg, Oral, Daily (standard), Take in addition to ONE 1 mg tablets for a total daily dose of 5 mg.    latanoprost (XALATAN) 0.005 % ophthalmic solution 1 drop, Nightly    magnesium  oxide-Mg AA chelate (MAGNESIUM , AMINO ACID CHELATE,) 133 mg 1 tablet, Oral, 2 times a day    metFORMIN  (GLUCOPHAGE -XR) 500 mg, Oral, Daily (RT)    [Paused] mycophenolate  (MYFORTIC ) 360 mg, Oral, 2 times a day (standard)    [Paused] olmesartan  (BENICAR ) 40 mg, Oral, Daily (standard)    [START ON 07/20/2024] predniSONE  (DELTASONE ) 5 mg, Oral, Daily (standard)    tacrolimus  (ENVARSUS  XR) 1 mg Tb24 extended release tablet 1 tablet, Daily           Past Medical History[2]    Past Surgical History[3]    Family History[4]    Short Social History[5]    OBJECTIVE:  BP 134/67  - Pulse 51  - Temp 36.6 ??C (97.9 ??F) (Temporal)  - Resp 18  - Ht 170.2 cm (5' 7.01)  - Wt 73 kg (160 lb 15 oz)  - SpO2 97%  - BMI 25.20 kg/m??   Wt Readings from Last 12 Encounters:   07/13/24 73 kg (160 lb 15 oz)   07/03/24 72.6 kg (160 lb)   01/18/24 73.2 kg (161 lb 6.4 oz)   06/08/23 68.9 kg (151 lb 12.8 oz)   03/22/23 68.3 kg (150 lb 8 oz)   02/14/23 68.4 kg (150 lb 12.8 oz)   12/21/22 68 kg (149 lb 14.4 oz)   11/27/22 67.6 kg (149 lb)   11/02/22 68.1 kg (150 lb 3.2 oz)   10/13/22 68.2 kg (150 lb 6.4 oz)   06/02/22 67 kg (147 lb 9.6 oz)   02/08/22 67.4 kg (148 lb 9.6 oz)     Physical Exam  Vitals and nursing note reviewed.   Constitutional:       General: He is not in acute distress.     Appearance: Normal appearance.   Pulmonary:      Effort: Pulmonary effort is normal. No respiratory distress.   Skin:     General: Skin is warm and dry.   Neurological:      General: No focal deficit present.      Mental Status: He is alert and oriented to person, place, and time.   Psychiatric:         Mood and Affect: Mood normal.         Behavior: Behavior normal.         BG/insulin  reviewed per EMR.   Glucose, POC   Date Value   07/18/2024 126 mg/dL   87/88/7974 859 mg/dL   87/88/7974 783 mg/dL (H)   87/88/7974 885 mg/dL   87/88/7974 873 mg/dL   87/89/7974 828 mg/dL   87/89/7974 824 mg/dL   87/89/7974 846 mg/dL   89/76/7986 784 MG/DL (H)   90/74/7986 55 MG/DL (L)   90/74/7986  52 MG/DL (L)   93/78/7986 96 MG/DL   93/78/7986 844 MG/DL   93/78/7986 877 MG/DL   93/79/7986 768 MG/DL (H)   93/79/7986 91 MG/DL        Summary of labs:  Lab Results   Component Value Date    A1C 6.8 (H) 07/03/2024    A1C 6.2 (H) 01/07/2024    A1C 6.0 (H) 09/10/2023     Lab Results   Component Value Date    GFR 5.11 (L) 05/01/2012    CREATININE 1.72 (H) 07/17/2024     Lab Results   Component Value Date    WBC 20.3 (H) 07/17/2024    HGB 12.6 (L) 07/17/2024    HCT 39.0 07/17/2024    PLT 261 07/17/2024       Lab Results   Component Value Date    NA 147 (H) 07/17/2024    K 4.7 07/17/2024    CL 109 (H) 07/17/2024    CO2 26.0 07/17/2024    BUN 36 (H) 07/17/2024    CREATININE 1.72 (H) 07/17/2024    GLU 166 07/17/2024    CALCIUM 10.1 07/17/2024    MG 2.0 07/14/2024    PHOS 3.8 07/17/2024       Lab Results   Component Value Date    BILITOT 0.2 (L) 07/03/2024    BILIDIR 0.20 12/10/2023    PROT 7.2 07/03/2024    ALBUMIN 3.1 (L) 07/17/2024    ALT 17 07/03/2024    AST 20 07/03/2024    ALKPHOS 78 07/03/2024    GGT 20 06/21/2019                             [1]    [Provider Hold] amlodipine   10 mg Oral Daily    atorvastatin   40 mg Oral Daily    dexAMETHasone   2 mg Oral Q8H SCH    Followed by    NOREEN ON 07/20/2024] dexAMETHasone   2 mg Oral Q12H SCH    Followed by    NOREEN ON 07/22/2024] dexAMETHasone   1 mg Oral Q12H [facility]    famotidine   20 mg Oral Daily    heparin  (porcine) for subcutaneous use  5,000 Units Subcutaneous Q8H SCH    insulin  lispro  8 Units Subcutaneous 3xd Meals    insulin  lispro  0-20 Units Subcutaneous ACHS    insulin  NPH  7 Units Subcutaneous Q12H SCH    senna  2 tablet Oral Nightly    tacrolimus   6 mg Oral Daily    valsartan   160 mg Oral Daily   [2]   Past Medical History:  Diagnosis Date    Anemia     Cancer (CMS-HCC)     clear cell adenocarcinoma of left kidney    Diabetes mellitus (CMS-HCC)     Diabetic nephropathy (CMS-HCC)     ESRD (end stage renal disease) on dialysis    (CMS-HCC)     ESRD on dialysis (CMS-HCC)     Gout     Hyperlipidemia     Hypertension    [3]   Past Surgical History:  Procedure Laterality Date    AV FISTULA PLACEMENT      PR COLONOSCOPY FLX DX W/COLLJ SPEC WHEN PFRMD N/A 04/30/2018    Procedure: COLONOSCOPY, FLEXIBLE, PROXIMAL TO SPLENIC FLEXURE; DIAGNOSTIC, W/WO COLLECTION SPECIMEN BY BRUSH OR WASH;  Surgeon: Dorn Lynwood Lauth, MD;  Location: [facility] PROCED[facility];  Service: Gastroenterology  PR COLONOSCOPY W/BIOPSY SINGLE/MULTIPLE N/A 04/30/2018    Procedure: COLONOSCOPY, FLEXIBLE, PROXIMAL TO SPLENIC FLEXURE; WITH BIOPSY, SINGLE OR MULTIPLE;  Surgeon: Dorn Lynwood Lauth, MD;  Location: [facility] PROCEDURES [facility];  Service: Gastroenterology    PR COLSC FLX W/RMVL OF TUMOR POLYP LESION SNARE TQ N/A 04/30/2018    Procedure: COLONOSCOPY FLEX; W/REMOV TUMOR/LES BY SNARE;  Surgeon: Dorn Lynwood Lauth, MD;  Location: [facility] PROCEDURES [facility];  Service: Gastroenterology    PR EXCIS SUPRATENT BRAIN TUMOR Left 07/11/2024    Procedure: Left craniotomy for resection of mass;  Surgeon: Zettie Rosaria HERO, MD;  Location: OR [facility];  Service: Neurosurgery    PR EXPLORATORY OF ABDOMEN Midline 04/14/2016    Procedure: EXPLORATORY LAPAROTOMY, EXPLORATORY CELIOTOMY WITH OR WITHOUT BIOPSY(S);  Surgeon: Marsa Sam Boss, MD;  Location: MAIN OR [facility];  Service: Transplant    PR EXPLORATORY OF ABDOMEN N/A 04/16/2016    Procedure: EXPLORATORY LAPAROTOMY, EXPLORATORY CELIOTOMY WITH OR WITHOUT BIOPSY(S);  Surgeon: Marsa Sam Boss, MD;  Location: MAIN OR [facility];  Service: Transplant    PR IONM 1 ON 1 IN OR W/ATTENDANCE EACH 15 MINUTES N/A 07/11/2024    Procedure: CONTINUOUS INTRAOPERATIVE NEUROPHYSIOLOGY MONITORING IN OR;  Surgeon: Zettie Rosaria HERO, MD;  Location: OR [facility];  Service: Neurosurgery    PR LAP, RADICAL NEPHRECTOMY Right 12/27/2016    Procedure: Robotic Xi Laparoscopy; Radical Nephrectomy (Incl Remove Gerota`S Fascia, Fatty Tissue, Reg Lymph Node, Adrenalectomy);  Surgeon: Clorinda Ole Chalk, MD;  Location: MAIN OR [facility];  Service: Urology    PR LIGATN ANGIOACCESS AV FISTULA Left 11/29/2022    Procedure: LIGATION OR BANDING OF ANGIOACCESS ARTERIOVENOUS FISTULA;  Surgeon: [place], Marsa Sam, MD;  Location: MAIN OR [facility];  Service: Transplant    PR MICROSURG TECHNIQUES,REQ OPER MICROSCOPE N/A 07/11/2024    Procedure: MICROSURGICAL TECHNIQUES, REQUIRING USE OF OPERATING MICROSCOPE (LIST SEPARATELY IN ADDITION TO CODE FOR PRIMARY PROCEDURE);  Surgeon: Zettie Rosaria HERO, MD;  Location: OR [facility];  Service: Neurosurgery    PR NEGATIVE PRESSURE WOUND THERAPY DME >50 SQ CM N/A 04/12/2016    Procedure: NEG PRESS WOUND TX (VAC ASSIST) INCL TOPICALS, PER SESSION, TSA GREATER THAN/= 50 CM SQUARED;  Surgeon: Marsa Sam Boss, MD;  Location: MAIN OR [place];  Service: Transplant    PR NEGATIVE PRESSURE WOUND THERAPY DME >50 SQ CM Bilateral 04/14/2016    Procedure: NEG PRESS WOUND TX (VAC ASSIST) INCL TOPICALS, PER SESSION, TSA GREATER THAN/= 50 CM SQUARED;  Surgeon: Marsa Sam Boss, MD;  Location: MAIN OR [place];  Service: Transplant    PR NEGATIVE PRESSURE WOUND THERAPY DME >50 SQ CM N/A 04/16/2016    Procedure: NEG PRESS WOUND TX (VAC ASSIST) INCL TOPICALS, PER SESSION, TSA GREATER THAN/= 50 CM SQUARED;  Surgeon: Marsa Sam Boss, MD;  Location: MAIN OR [facility];  Service: Transplant    PR NEPHRECTOMY, W/PART. URETECTOMY Bilateral 04/11/2016    Procedure: LAPAROSCOPY, SURGICAL, NEPHRECTOMY WITH TOTAL URETERECTOMY;  Surgeon: Marsa Sam Boss, MD;  Location: MAIN OR [facility];  Service: Transplant    PR REMV KIDNEY,W/RIB RESECTION Bilateral 04/11/2016    Procedure: NEPHRECTOMY, INCLUDING PARTIAL URETERECTOMY, ANY OPEN APPROACH INCLUDING RIB RESECTION;  Surgeon: Marsa Sam Boss, MD;  Location: MAIN OR [facility];  Service: Transplant    PR STEREOTACTIC COMP ASSIST PROC,CRANIAL,INTRADURAL N/A 07/11/2024    Procedure: STEREOTACTIC COMPUTER-ASSISTED (NAVIGATIONAL) PROCEDURE; CRANIAL, INTRADURAL;  Surgeon: Zettie Rosaria HERO, MD;  Location: OR [facility];  Service: Neurosurgery    PR TRANSPLANT,PREP LIVING  RENAL GRAFT N/A 06/22/2019  Procedure: BACKBENCH STD PREP LIVING DONOR RENAL ALLGRFT (OPEN/LAPROSC) PRIOR TO TRANSPLANT, INC DISSECT/REM AS NECESS;  Surgeon: Marsa Sam Boss, MD;  Location: MAIN OR [facility];  Service: Transplant    PR TRANSPLANTATION OF KIDNEY N/A 06/22/2019    Procedure: RENAL ALLOTRANSPLANTATION, IMPLANTATION OF GRAFT; WITHOUT RECIPIENT NEPHRECTOMY;  Surgeon: Marsa Sam Boss, MD;  Location: MAIN OR [facility];  Service: Transplant    SPLENECTOMY     [4]   Family History  Problem Relation Age of Onset    Kidney disease Mother     Cancer Father     Kidney disease Brother    [5]   Social History  Tobacco Use    Smoking status: Never    Smokeless tobacco: Never   Vaping Use    Vaping status: Never Used   Substance Use Topics    Alcohol use: No    Drug use: No

## 2024-07-19 LAB — CBC
HEMATOCRIT: 36.9 % — ABNORMAL LOW (ref 39.0–48.0)
HEMOGLOBIN: 12 g/dL — ABNORMAL LOW (ref 12.9–16.5)
MEAN CORPUSCULAR HEMOGLOBIN CONC: 32.4 g/dL (ref 32.0–36.0)
MEAN CORPUSCULAR HEMOGLOBIN: 29.2 pg (ref 25.9–32.4)
MEAN CORPUSCULAR VOLUME: 90.1 fL (ref 77.6–95.7)
MEAN PLATELET VOLUME: 9.8 fL (ref 6.8–10.7)
PLATELET COUNT: 251 10*9/L (ref 150–450)
RED BLOOD CELL COUNT: 4.09 10*12/L — ABNORMAL LOW (ref 4.26–5.60)
RED CELL DISTRIBUTION WIDTH: 15.7 % — ABNORMAL HIGH (ref 12.2–15.2)
WBC ADJUSTED: 15.4 10*9/L — ABNORMAL HIGH (ref 3.6–11.2)

## 2024-07-19 LAB — RENAL FUNCTION PANEL
ALBUMIN: 2.9 g/dL — ABNORMAL LOW (ref 3.4–5.0)
ANION GAP: 13 mmol/L (ref 5–14)
BLOOD UREA NITROGEN: 32 mg/dL — ABNORMAL HIGH (ref 9–23)
BUN / CREAT RATIO: 20
CALCIUM: 9.5 mg/dL (ref 8.7–10.4)
CHLORIDE: 108 mmol/L — ABNORMAL HIGH (ref 98–107)
CO2: 21 mmol/L (ref 20.0–31.0)
CREATININE: 1.64 mg/dL — ABNORMAL HIGH (ref 0.73–1.18)
EGFR CKD-EPI (2021) MALE: 44 mL/min/1.73m2 — ABNORMAL LOW (ref >=60)
GLUCOSE RANDOM: 222 mg/dL — ABNORMAL HIGH (ref 70–179)
PHOSPHORUS: 2.9 mg/dL (ref 2.4–5.1)
POTASSIUM: 4.5 mmol/L (ref 3.4–4.8)
SODIUM: 142 mmol/L (ref 135–145)

## 2024-07-19 MED ADMIN — tacrolimus (ENVARSUS XR) extended release tablet 6 mg: 6 mg | ORAL | @ 15:00:00

## 2024-07-19 MED ADMIN — heparin (porcine) 5,000 unit/mL injection 5,000 Units: 5000 [IU] | SUBCUTANEOUS | @ 11:00:00

## 2024-07-19 MED ADMIN — heparin (porcine) 5,000 unit/mL injection 5,000 Units: 5000 [IU] | SUBCUTANEOUS | @ 18:00:00

## 2024-07-19 MED ADMIN — heparin (porcine) 5,000 unit/mL injection 5,000 Units: 5000 [IU] | SUBCUTANEOUS | @ 02:00:00

## 2024-07-19 MED ADMIN — famotidine (PEPCID) tablet 20 mg: 20 mg | ORAL | @ 15:00:00

## 2024-07-19 MED ADMIN — senna (SENOKOT) tablet 2 tablet: 2 | ORAL | @ 02:00:00

## 2024-07-19 MED ADMIN — insulin lispro (HumaLOG) injection CORRECTIONAL 0-20 Units: 0-20 [IU] | SUBCUTANEOUS | @ 18:00:00

## 2024-07-19 MED ADMIN — insulin lispro (HumaLOG) inj PERCENTAGE MEAL EATEN 6 Units: 6 [IU] | SUBCUTANEOUS | @ 23:00:00

## 2024-07-19 MED ADMIN — insulin lispro (HumaLOG) inj PERCENTAGE MEAL EATEN 6 Units: 6 [IU] | SUBCUTANEOUS | @ 18:00:00

## 2024-07-19 MED ADMIN — atorvastatin (LIPITOR) tablet 40 mg: 40 mg | ORAL | @ 15:00:00

## 2024-07-19 MED ADMIN — dexAMETHasone (DECADRON) tablet 2 mg: 2 mg | ORAL | @ 18:00:00 | Stop: 2024-07-20

## 2024-07-19 MED ADMIN — dexAMETHasone (DECADRON) tablet 2 mg: 2 mg | ORAL | @ 02:00:00 | Stop: 2024-07-20

## 2024-07-19 MED ADMIN — dexAMETHasone (DECADRON) tablet 2 mg: 2 mg | ORAL | @ 11:00:00 | Stop: 2024-07-20

## 2024-07-19 MED ADMIN — insulin NPH (HumuLIN,NovoLIN) injection 5 Units: 5 [IU] | SUBCUTANEOUS | @ 02:00:00

## 2024-07-19 MED ADMIN — insulin NPH (HumuLIN,NovoLIN) injection 5 Units: 5 [IU] | SUBCUTANEOUS | @ 15:00:00

## 2024-07-19 MED ADMIN — valsartan (DIOVAN) tablet 160 mg: 160 mg | ORAL | @ 15:00:00

## 2024-07-19 NOTE — Plan of Care (Signed)
 Shift Summary  Correctional insulin  was administered in response to elevated blood glucose levels, resulting in improved glucose control by late afternoon.   Fall prevention interventions were maintained throughout the shift, with no reported falls or injuries.   Pain remained at 0 and no comfort interventions were required.   Family was present and updated throughout the day.   Overall, the patient remained safe and comfortable during the shift.     Optimal Comfort and Wellbeing: Pain remained at 0 throughout the shift and no pain interventions were required; blood glucose levels fluctuated but were managed with correctional insulin  administration, with levels decreasing from high readings in the morning and early afternoon to a normal value by late afternoon. Renal function panel and CBC showed some abnormal values, but no acute changes in comfort or wellbeing were documented.     Absence of Fall and Fall-Related Injury: Fall prevention strategies were consistently maintained, including low bed position, fall reduction program, non-skid footwear, and regular toileting; hourly visual checks confirmed the patient remained awake and in bed, and no falls or injuries occurred during the shift.

## 2024-07-19 NOTE — Progress Notes (Signed)
 CRANIAL NEUROSURGERY   INPATIENT PROGRESS NOTE      Brief History of Present Illness  Frank Leach is a 73 y.o. male with RCC/renal transplant now found to have a left frontal brain mass now s/p left craniotomy for resection (12/5).    Subjective/Interval History  CT sim completed yesterday. Pending AIR.     Interval Imaging Reviewed  None    Neurological Assessment and Plan  **Frontal mass of brain  POD8 Left craniotomy for resection of brain mass (12/5)  - SBP < 160; Na > 135  - dex taper to 2mg  BID  - Keppra  500 BID x 7 days  - Continue tacrolimus  per nephro   - HOB > 30  - PT/OT  - Pending AIR    Anticoagulant/Antiplatelet needs: ASA- hold    Disposition: floor status    ___________________________________________________________________    Neurological Exam  EOSp  Ox3  PERRL  EOMI  FS  TML  RUE 4  LUE 5  RLE 4++  LLE 5    Incision c/d/I staples     The patient's vitals, intake/output, labs, orders, and relevant imaging were reviewed for the last 24 hours.    Problem List  Principal Problem:    Frontal mass of brain

## 2024-07-19 NOTE — Treatment Plan (Signed)
 Transplant Treatment Note    Labs and chart reviewed.    Overnight events: No acute events noted overnight. UOP 1225 mL. Labs pending.    Assessment/recommendations:  Labs pending from today, will follow up when available.  Continue current immunosuppression for now.    I am available by [employer] or pager [phone] over the weekend.    Wonda Breen, MD  Transplant Nephrology

## 2024-07-19 NOTE — Plan of Care (Signed)
 Shift Summary  Insulin  NPH was administered in the evening in response to a blood glucose of 147 mg/dL.   Frequent repositioning and pressure reduction interventions were performed to support skin integrity and comfort.   Fall prevention strategies, including bed alarms and scheduled toileting, were maintained throughout the shift.   Anti-embolism devices were offered but consistently refused.   Overall, remained stable with no new hospital-acquired complications or falls during the shift.     Absence of Hospital-Acquired Illness or Injury: No new hospital-acquired illnesses or injuries were documented during the shift, and safety interventions such as bed alarms and fall reduction programs were maintained throughout the night.     Optimal Comfort and Wellbeing: Comfort measures such as frequent repositioning and head of bed elevation were provided, and no acute discomfort or pain was documented.     Absence of Fall and Fall-Related Injury: Fall prevention strategies including bed alarms, hourly visual checks, and scheduled toileting were consistently implemented, and no falls or related injuries occurred.     Skin Health and Integrity: Skin remained intact with surgical sites noted, frequent repositioning and pressure reduction techniques were used, and wound assessment showed clean, dry, and intact skin with attached margins.     Improved Ability to Complete Activities of Daily Living: Moderate assistance was required for mobility and minimal assistance for hygiene and bathing, with the ability to feed self maintained throughout the shift.

## 2024-07-20 LAB — TACROLIMUS LEVEL, TROUGH
TACROLIMUS, TROUGH: 5.1 ng/mL (ref 5.0–15.0)
TACROLIMUS, TROUGH: 2.6 ng/mL — ABNORMAL LOW (ref 5.0–15.0)

## 2024-07-20 MED ORDER — PREDNISONE 5 MG TABLET
ORAL_TABLET | Freq: Every day | ORAL | 3 refills | 90.00000 days | Status: CP
Start: 2024-07-20 — End: ?

## 2024-07-20 MED ADMIN — tacrolimus (ENVARSUS XR) extended release tablet 6 mg: 6 mg | ORAL | @ 13:00:00 | Stop: 2024-07-20

## 2024-07-20 MED ADMIN — heparin (porcine) 5,000 unit/mL injection 5,000 Units: 5000 [IU] | SUBCUTANEOUS | @ 10:00:00

## 2024-07-20 MED ADMIN — heparin (porcine) 5,000 unit/mL injection 5,000 Units: 5000 [IU] | SUBCUTANEOUS | @ 18:00:00

## 2024-07-20 MED ADMIN — heparin (porcine) 5,000 unit/mL injection 5,000 Units: 5000 [IU] | SUBCUTANEOUS | @ 03:00:00

## 2024-07-20 MED ADMIN — famotidine (PEPCID) tablet 20 mg: 20 mg | ORAL | @ 13:00:00

## 2024-07-20 MED ADMIN — insulin lispro (HumaLOG) injection CORRECTIONAL 0-20 Units: 0-20 [IU] | SUBCUTANEOUS | @ 18:00:00

## 2024-07-20 MED ADMIN — insulin lispro (HumaLOG) injection CORRECTIONAL 0-20 Units: 0-20 [IU] | SUBCUTANEOUS | @ 03:00:00

## 2024-07-20 MED ADMIN — insulin lispro (HumaLOG) inj PERCENTAGE MEAL EATEN 6 Units: 6 [IU] | SUBCUTANEOUS

## 2024-07-20 MED ADMIN — insulin lispro (HumaLOG) inj PERCENTAGE MEAL EATEN 6 Units: 6 [IU] | SUBCUTANEOUS | @ 19:00:00

## 2024-07-20 MED ADMIN — insulin lispro (HumaLOG) inj PERCENTAGE MEAL EATEN 6 Units: 6 [IU] | SUBCUTANEOUS | @ 13:00:00

## 2024-07-20 MED ADMIN — atorvastatin (LIPITOR) tablet 40 mg: 40 mg | ORAL | @ 13:00:00

## 2024-07-20 MED ADMIN — dexAMETHasone (DECADRON) tablet 2 mg: 2 mg | ORAL | @ 03:00:00 | Stop: 2024-07-20

## 2024-07-20 MED ADMIN — dexAMETHasone (DECADRON) tablet 2 mg: 2 mg | ORAL | @ 10:00:00 | Stop: 2024-07-20

## 2024-07-20 MED ADMIN — insulin NPH (HumuLIN,NovoLIN) injection 5 Units: 5 [IU] | SUBCUTANEOUS | @ 13:00:00 | Stop: 2024-07-20

## 2024-07-20 MED ADMIN — insulin NPH (HumuLIN,NovoLIN) injection 5 Units: 5 [IU] | SUBCUTANEOUS | @ 03:00:00

## 2024-07-20 MED ADMIN — valsartan (DIOVAN) tablet 160 mg: 160 mg | ORAL | @ 13:00:00

## 2024-07-20 MED ADMIN — dexAMETHasone (DECADRON) tablet 2 mg: 2 mg | ORAL | @ 18:00:00

## 2024-07-20 NOTE — Progress Notes (Signed)
 CRANIAL NEUROSURGERY   INPATIENT PROGRESS NOTE      Brief History of Present Illness  Frank Leach is a 73 y.o. male with RCC/renal transplant now found to have a left frontal brain mass now s/p left craniotomy for resection (12/5).    Subjective/Interval History  CT sim completed yesterday. Pending AIR.     Interval Imaging Reviewed  None    Neurological Assessment and Plan  **Frontal mass of brain  POD9 Left craniotomy for resection of brain mass (12/5)  - SBP < 160; Na > 135  - dex taper to 2mg  BID  - Keppra  500 BID x 7 days  - Continue tacrolimus  per nephro   - HOB > 30  - PT/OT  - Pending AIR    Anticoagulant/Antiplatelet needs: ASA- hold    Disposition: floor status    ___________________________________________________________________    Neurological Exam  EOSp  Ox3  PERRL  EOMI  FS  TML  RUE 4  LUE 5  RLE 4++  LLE 5    Incision c/d/I staples     The patient's vitals, intake/output, labs, orders, and relevant imaging were reviewed for the last 24 hours.    Problem List  Principal Problem:    Frontal mass of brain

## 2024-07-20 NOTE — Consults (Signed)
 Tacrolimus  Therapeutic Monitoring Pharmacy Note    Frank Leach is a 73 y.o. male continuing tacrolimus .     Indication: Kidney transplant     Date of Transplant: 06/22/2019      Prior Dosing Information: Envarsus  6 mg PO daily  FYI: Home regimen Envarsus  5 mg PO daily     Source(s) of information used to determine prior to admission dosing: Patient/Caregiver, Home Medication List, or Clinic Note    Goals:  Therapeutic Drug Levels  Tacrolimus  trough goal: 4-6 ng/mL    Additional Clinical Monitoring/Outcomes  Monitor renal function (SCr and urine output) and liver function (LFTs)  Monitor for signs/symptoms of adverse events (e.g., hyperglycemia, hyperkalemia, hypomagnesemia, hypertension, headache, tremor)    Previous Lab Results:  Tacrolimus , Trough   Date Value Ref Range Status   07/20/2024 2.6 (L) 5.0 - 15.0 ng/mL Final   07/19/2024 5.1 5.0 - 15.0 ng/mL Final   07/18/2024 2.6 (L) 5.0 - 15.0 ng/mL Final   07/17/2024 3.0 (L) 5.0 - 15.0 ng/mL Final   07/16/2024 2.1 (L) 5.0 - 15.0 ng/mL Final     Creatinine   Date Value Ref Range Status   07/19/2024 1.64 (H) 0.73 - 1.18 mg/dL Final   87/88/7974 8.27 (H) 0.73 - 1.18 mg/dL Final   87/90/7974 8.33 (H) 0.73 - 1.18 mg/dL Final        Result: Tacrolimus  trough was 2.6 ng/mL (drawn ~ 3 hours early)    Pharmacokinetic Considerations and Significant Drug Interactions:  Concurrent CYP3A4 substrates/inhibitors: amlodipine     Assessment/Plan:  Recommendation(s)  Today's tacrolimus  level is subtherapeutic despite dose increase a few days ago. Trough level from yesterday was drawn hours after dose was given and anticipate that this is the reason for it being therapeutic.   Upon discussion with transplant nephrology attending, plan to INCREASE Envarsus  today to 7 mg PO daily starting tomorrow morning.    Follow-up  Daily levels have been ordered at 0600.   A pharmacist will continue to monitor and recommend levels as appropriate    Please page service pharmacist with questions/clarifications.    Keiera Strathman Fabricio Macil Crady, PharmD  PGY2 Critical Care Pharmacy Resident

## 2024-07-20 NOTE — Plan of Care (Signed)
 Shift Summary  Correctional insulin  was administered after elevated blood glucose readings were observed during the shift.   Family remained present and engaged, receiving regular updates.   Surgical site on the scalp remained intact and clean, with no dressing required and pressure reduction strategies in use.   No falls or injuries occurred, with consistent use of fall prevention interventions.   Overall, remained stable with adequate oral intake and preserved ability to participate in activities of daily living.   Patient is pending AIR placement    Rounds/Family Conference: Family was present and visiting throughout the shift, with regular updates provided at multiple time points.     Absence of Fall and Fall-Related Injury: Multiple fall prevention interventions were maintained, including bed alarms, frequent visual checks, and use of nonskid footwear, with no falls or injuries documented during the shift.     Skin Health and Integrity: Surgical site remained intact, clean, and dry with no dressing required, and frequent repositioning and pressure reduction techniques were used.     Improved Ability to Complete Activities of Daily Living: Minimal assistance was needed for hygiene and bathing, and standby assistance was provided for mobility, with the ability to feed self maintained.     Improved Oral Intake: Oral intake was adequate with 50% of meals consumed and the ability to feed self preserved.

## 2024-07-21 LAB — TACROLIMUS LEVEL, TROUGH: TACROLIMUS, TROUGH: 1.6 ng/mL — ABNORMAL LOW (ref 5.0–15.0)

## 2024-07-21 MED ADMIN — heparin (porcine) 5,000 unit/mL injection 5,000 Units: 5000 [IU] | SUBCUTANEOUS | @ 19:00:00

## 2024-07-21 MED ADMIN — heparin (porcine) 5,000 unit/mL injection 5,000 Units: 5000 [IU] | SUBCUTANEOUS | @ 03:00:00

## 2024-07-21 MED ADMIN — heparin (porcine) 5,000 unit/mL injection 5,000 Units: 5000 [IU] | SUBCUTANEOUS | @ 10:00:00

## 2024-07-21 MED ADMIN — famotidine (PEPCID) tablet 20 mg: 20 mg | ORAL | @ 14:00:00

## 2024-07-21 MED ADMIN — insulin lispro (HumaLOG) injection CORRECTIONAL 0-20 Units: 0-20 [IU] | SUBCUTANEOUS | @ 03:00:00

## 2024-07-21 MED ADMIN — insulin lispro (HumaLOG) inj PERCENTAGE MEAL EATEN 6 Units: 6 [IU] | SUBCUTANEOUS | @ 18:00:00

## 2024-07-21 MED ADMIN — insulin lispro (HumaLOG) inj PERCENTAGE MEAL EATEN 6 Units: 6 [IU] | SUBCUTANEOUS | @ 23:00:00

## 2024-07-21 MED ADMIN — insulin NPH (HumuLIN,NovoLIN) injection 8 Units: 8 [IU] | SUBCUTANEOUS | @ 03:00:00

## 2024-07-21 MED ADMIN — insulin NPH (HumuLIN,NovoLIN) injection 8 Units: 8 [IU] | SUBCUTANEOUS | @ 14:00:00

## 2024-07-21 MED ADMIN — tacrolimus (ENVARSUS XR) extended release tablet 7 mg: 7 mg | ORAL | @ 14:00:00

## 2024-07-21 MED ADMIN — atorvastatin (LIPITOR) tablet 40 mg: 40 mg | ORAL | @ 14:00:00

## 2024-07-21 MED ADMIN — valsartan (DIOVAN) tablet 160 mg: 160 mg | ORAL | @ 14:00:00

## 2024-07-21 MED ADMIN — dexAMETHasone (DECADRON) tablet 2 mg: 2 mg | ORAL | @ 03:00:00

## 2024-07-21 MED ADMIN — dexAMETHasone (DECADRON) tablet 2 mg: 2 mg | ORAL | @ 14:00:00

## 2024-07-21 NOTE — Consults (Signed)
 Endocrine Team Diabetes Follow Up Consult Note     Consult information:  Requesting Attending Physician : Rosaria CHRISTELLA Lush, MD  Service Requesting Consult : Neurosurgery [facility])  Primary Care Provider: Kandis Stefano Houston, MD  Impression:  Frank Leach is a 73 y.o. male admitted for brain lesion. We have been consulted at the request of Rosaria CHRISTELLA Lush, MD to evaluate Frank Leach for hyperglycemia.     Medical Decision Making:  Diagnoses:  1.Type 2 Diabetes. Uncontrolled With hyperglycemia.  2. Nutrition: Complicating glycemic control. Increasing risk for both hypoglycemia and hyperglycemia.  3. Steroids. Complicating glycemic control and increasing risk for hyperglycemia.  4. Transplant. Complicating glycemic control and increasing risk for hyperglycemia.  5. Chronic Kidney Disease. Complicating glycemic control and increasing risk for hypoglycemia.        Studies reviewed 07/21/24:  Labs: CBC, BMP, POCT-BG, and HbA1C  Interpretation: +Leukocytosis. Normal potassium. Elevated Cr in the setting of known CKD. Intermittent hyperglycemia. A1C 6.8% indicates excellent control outpatient.  Notes reviewed: Primary team and nursing notes      Overall impression based on above reviews and history:  Hyperglycemia in patient with known type 2 diabetes complicated by steroids. BG occasionally above goal, however typically after reduced dose administered for decreased intake. Appears he is eating mostly carbs. Will leave on current regiment.      Steroid taper:  12/7: dexamethasone  4mg  q6h  12/9: dexamethasone  2mg  q6h  12/12: dexamethasone  2mg  q8h  12/14: dexamethasone  2mg  q12h  12/16: dexamethasone  1mg  q12h    Recommendations:  - NPH 5u q12h  - lispro 6u qAC  - lispro target 140, ISF 20 achs  - Hypoglycemia protocol.  - POCT-BG achs.  - Ensure patient is on glucose precautions if patient taking nutrition by mouth.     Discharge planning:  In process. Will complete actual plan closer to discharge.   Anticipate discharging back on home regimen. May depend on steroids.     Thank you for this consult. Discussed plan with primary team. We will continue to follow and make recommendations and place orders as appropriate.    Please page with questions or concerns: Jaclin Finks, NP: [phone]  DCT on call from 6AM - 3PM on weekdays then endocrine fellow on call: [phone] from 3PM - 6AM on weekdays and on weekends and holidays.   If APP cannot be reached, please page the endocrine fellow on call.      Subjective:  Interval history: Reports decent po intake. Denies complaints at time of rounds.     Initial HPI:  Frank Leach is a 73 y.o. 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     Diabetes History:  Patient has a history of Type 2 diabetes diagnosed at least 15+ years ago.  Diabetes is managed by: PCP, nephrologist.  Current home diabetes regimen: metformin  XR 500mg  qAM,empagliflozin  25mg  qam.  Current home blood glucose monitoring:  occasionally.  Hypoglycemia awareness: yes.  Complications related to diabetes: ESRD s/p transplant in 2020      Current Nutrition:  Active Orders   Diet    Nutrition Therapy Regular/House       ROS: As per HPI.    Scheduled Medications[1]    Current Outpatient Medications   Medication Instructions    acetaminophen  (TYLENOL ) 500-1,000 mg,  Oral, Every 6 hours PRN    allopurinol  (ZYLOPRIM ) 100 mg, Oral, Every evening    [Paused] amlodipine  (NORVASC ) 10 mg, Oral, Daily (standard)    atorvastatin  (LIPITOR ) 40 mg, Oral, Daily (standard)    ENVARSUS  XR 4 mg, Oral, Daily (standard), Take in addition to ONE 1 mg tablets for a total daily dose of 5 mg.    latanoprost (XALATAN) 0.005 % ophthalmic solution 1 drop, Nightly    magnesium  oxide-Mg AA chelate (MAGNESIUM , AMINO ACID CHELATE,) 133 mg 1 tablet, Oral, 2 times a day    metFORMIN  (GLUCOPHAGE -XR) 500 mg, Oral, Daily (RT)    [Paused] mycophenolate  (MYFORTIC ) 360 mg, Oral, 2 times a day (standard)    [Paused] olmesartan  (BENICAR ) 40 mg, Oral, Daily (standard)    ondansetron  (ZOFRAN ) 8 mg, Oral, Every 8 hours PRN, Take 1 tablet 1 hour before the chemotherapy (temozolomide ).    predniSONE  (DELTASONE ) 5 mg, Oral, Daily (standard)    tacrolimus  (ENVARSUS  XR) 1 mg Tb24 extended release tablet 1 tablet, Daily    temozolomide  (TEMODAR ) 75 mg/m2/day, Oral, Daily (standard)           Past Medical History[2]    Past Surgical History[3]    Family History[4]    Short Social History[5]    OBJECTIVE:  BP 129/71  - Pulse 50  - Temp 35.9 ??C (96.6 ??F) (Temporal)  - Resp 16  - Ht 170.2 cm (5' 7.01)  - Wt 73 kg (160 lb 15 oz)  - SpO2 96%  - BMI 25.20 kg/m??   Wt Readings from Last 12 Encounters:   07/13/24 73 kg (160 lb 15 oz)   07/03/24 72.6 kg (160 lb)   01/18/24 73.2 kg (161 lb 6.4 oz)   06/08/23 68.9 kg (151 lb 12.8 oz)   03/22/23 68.3 kg (150 lb 8 oz)   02/14/23 68.4 kg (150 lb 12.8 oz)   12/21/22 68 kg (149 lb 14.4 oz)   11/27/22 67.6 kg (149 lb)   11/02/22 68.1 kg (150 lb 3.2 oz)   10/13/22 68.2 kg (150 lb 6.4 oz)   06/02/22 67 kg (147 lb 9.6 oz)   02/08/22 67.4 kg (148 lb 9.6 oz)     Physical Exam  Vitals and nursing note reviewed.   Constitutional:       General: He is not in acute distress.     Appearance: Normal appearance.   Pulmonary:      Effort: Pulmonary effort is normal. No respiratory distress.   Skin:     General: Skin is warm and dry.   Neurological:      General: No focal deficit present.      Mental Status: He is alert and oriented to person, place, and time.   Psychiatric:         Mood and Affect: Mood normal.         Behavior: Behavior normal.         BG/insulin  reviewed per EMR.   Glucose, POC   Date Value   07/21/2024 142 mg/dL   87/85/7974 773 mg/dL (H)   87/85/7974 852 mg/dL   87/85/7974 815 mg/dL (H)   87/85/7974 770 mg/dL (H)   87/85/7974 884 mg/dL   87/86/7974 774 mg/dL (H)   87/86/7974 885 mg/dL   89/76/7986 784 MG/DL (H)   90/74/7986 55 MG/DL (L)   90/74/7986 52 MG/DL (L)   93/78/7986 96 MG/DL   93/78/7986 844 MG/DL   93/78/7986 877 MG/DL   93/79/7986 768 MG/DL (H)  01/25/2012 91 MG/DL        Summary of labs:  Lab Results   Component Value Date    A1C 6.8 (H) 07/03/2024    A1C 6.2 (H) 01/07/2024    A1C 6.0 (H) 09/10/2023     Lab Results   Component Value Date    GFR 5.11 (L) 05/01/2012    CREATININE 1.64 (H) 07/19/2024     Lab Results   Component Value Date    WBC 15.4 (H) 07/19/2024    HGB 12.0 (L) 07/19/2024    HCT 36.9 (L) 07/19/2024    PLT 251 07/19/2024       Lab Results   Component Value Date    NA 142 07/19/2024    K 4.5 07/19/2024    CL 108 (H) 07/19/2024    CO2 21.0 07/19/2024    BUN 32 (H) 07/19/2024    CREATININE 1.64 (H) 07/19/2024    GLU 222 (H) 07/19/2024    CALCIUM 9.5 07/19/2024    MG 2.0 07/14/2024    PHOS 2.9 07/19/2024       Lab Results   Component Value Date    BILITOT 0.2 (L) 07/03/2024    BILIDIR 0.20 12/10/2023    PROT 7.2 07/03/2024    ALBUMIN 2.9 (L) 07/19/2024    ALT 17 07/03/2024    AST 20 07/03/2024    ALKPHOS 78 07/03/2024    GGT 20 06/21/2019                               [1]    [Provider Hold] amlodipine   10 mg Oral Daily    atorvastatin   40 mg Oral Daily    dexAMETHasone   2 mg Oral Q12H SCH    famotidine   20 mg Oral Daily    heparin  (porcine) for subcutaneous use  5,000 Units Subcutaneous Q8H SCH    insulin  lispro  6 Units Subcutaneous 3xd Meals    insulin  lispro  0-20 Units Subcutaneous ACHS    insulin  NPH  8 Units Subcutaneous Q12H SCH    senna  2 tablet Oral Nightly    tacrolimus   7 mg Oral Daily    valsartan   160 mg Oral Daily   [2]   Past Medical History:  Diagnosis Date    Anemia     Cancer    (CMS-HCC)     clear cell adenocarcinoma of left kidney    Diabetes mellitus (CMS-HCC)     Diabetic nephropathy (CMS-HCC)     ESRD (end stage renal disease) on dialysis    (CMS-HCC)     ESRD on dialysis (CMS-HCC) Gout     Hyperlipidemia     Hypertension    [3]   Past Surgical History:  Procedure Laterality Date    AV FISTULA PLACEMENT      PR COLONOSCOPY FLX DX W/COLLJ SPEC WHEN PFRMD N/A 04/30/2018    Procedure: COLONOSCOPY, FLEXIBLE, PROXIMAL TO SPLENIC FLEXURE; DIAGNOSTIC, W/WO COLLECTION SPECIMEN BY BRUSH OR WASH;  Surgeon: Dorn Lynwood Lauth, MD;  [facility]ED[facility];  Service: Gastroenterology    PR COLONOSCOPY W/BIOPSY SINGLE/MULTIPLE N/A 04/30/2018    Procedure: COLONOSCOPY, FLEXIBLE, PROXIMAL TO SPLENIC FLEXURE; WITH BIOPSY, SINGLE OR MULTIPLE;  Surgeon: Dorn Lynwood Lauth, MD;  Location: [facility]ED[facility];  Service: Gastroenterology    PR COLSC FLX W/RMVL OF TUMOR POLYP LESION SNARE TQ N/A 04/30/2018    Procedure: COLONOSCOPY FLEX; W/REMOV TUMOR/LES  BY SNARE;  Surgeon: Dorn Lynwood Lauth, MD;  Location: [facility] PROCED[facility];  Service: Gastroenterology    PR EXCIS SUPRATENT BRAIN TUMOR Left 07/11/2024    Procedure: Left craniotomy for resection of mass;  Surgeon: Zettie Rosaria HERO, MD;  Location[facility] OR [facility];  Service: Neurosurgery    PR EXPLORATORY OF ABDOMEN Midline 04/14/2016    Procedure: EXPLORATORY LAPAROTOMY, EXPLORATORY CELIOTOMY WITH OR WITHOUT BIOPSY(S);  Surgeon: Marsa Sam Boss, MD;  Location: [facility]IN OR [facility];  Service: Transplant    PR EXPLORATORY OF ABDOMEN N/A 04/16/2016    Procedure: EXPLORATORY LAPAROTOMY, EXPLORATORY CELIOTOMY WITH OR WITHOUT BIOPSY(S);  Surgeon: Marsa Sam Boss, MD;  Location: MAIN OR [facility];  Service: Transplant    PR IONM 1 ON 1 IN OR W/ATTENDANCE EACH 15 MINUTES N/A 07/11/2024    Procedure: CONTINUOUS INTRAOPERATIVE NEUROPHYSIOLOGY MONITORING IN OR;  Surgeon: Zettie Rosaria HERO, MD;  Location: OR [facility];  Service: Neurosurgery    PR LAP, RADICAL NEPHRECTOMY Right 12/27/2016    Procedure: Robotic Xi Laparoscopy; Radical Nephrectomy (Incl Remove Gerota`S Fascia, Fatty Tissue, Reg Lymph Node, Adrenalectomy);  Surgeon: Clorinda Ole Chalk, MD;  Location: MAIN OR [facility];  Service: Urology    PR LIGATN ANGIOACCESS AV FISTULA Left 11/29/2022    Procedure: LIGATION OR BANDING OF ANGIOACCESS ARTERIOVENOUS FISTULA;  Surgeon: [place], Marsa Sam, MD;  Location: MAIN OR [facility];  Service: Transplant    PR MICROSURG TECHNIQUES,REQ OPER MICROSCOPE N/A 07/11/2024    Procedure: MICROSURGICAL TECHNIQUES, REQUIRING USE OF OPERATING MICROSCOPE (LIST SEPARATELY IN ADDITION TO CODE FOR PRIMARY PROCEDURE);  Surgeon: Zettie Rosaria HERO, MD;  Location: OR [facility];  Service: Neurosurgery    PR NEGATIVE PRESSURE WOUND THERAPY DME >50 SQ CM N/A 04/12/2016    Procedure: NEG PRESS WOUND TX (VAC ASSIST) INCL TOPICALS, PER SESSION, TSA GREATER THAN/= 50 CM SQUARED;  Surgeon: Marsa Sam Boss, MD;  Location: MAIN OR [place];  Service: Transplant    PR NEGATIVE PRESSURE WOUND THERAPY DME >50 SQ CM Bilateral 04/14/2016    Procedure: NEG PRESS WOUND TX (VAC ASSIST) INCL TOPICALS, PER SESSION, TSA GREATER THAN/= 50 CM SQUARED;  Surgeon: Marsa Sam Boss, MD;  Location: MAIN OR [place];  Service: Transplant    PR NEGATIVE PRESSURE WOUND THERAPY DME >50 SQ CM N/A 04/16/2016    Procedure: NEG PRESS WOUND TX (VAC ASSIST) INCL TOPICALS, PER SESSION, TSA GREATER THAN/= 50 CM SQUARED;  Surgeon: Marsa Sam Boss, MD;  Location: MAIN OR [facility]  Service: Transplant    PR NEPHRECTOMY, W/PART. URETECTOMY Bilateral 04/11/2016    Procedure: LAPAROSCOPY, SURGICAL, NEPHRECTOMY WITH TOTAL URETERECTOMY;  Surgeon: Marsa Sam Boss, MD;  Location: MAIN OR [facility];  Service: Transplant    PR REMV KIDNEY,W/RIB RESECTION Bilateral 04/11/2016    Procedure: NEPHRECTOMY, INCLUDING PARTIAL URETERECTOMY, ANY OPEN APPROACH INCLUDING RIB RESECTION;  Surgeon: Marsa Sam Boss, MD;  Location: MAIN OR [facility];  Service: Transplant    PR STEREOTACTIC COMP ASSIST PROC,CRANIAL,INTRADURAL N/A 07/11/2024    Procedure: STEREOTACTIC COMPUTER-ASSISTED (NAVIGATIONAL) PROCEDURE; CRANIAL, INTRADURAL; Surgeon: Zettie Rosaria HERO, MD;  Location: OR [facility];  Service: Neurosurgery    PR TRANSPLANT,PREP LIVING  RENAL GRAFT N/A 06/22/2019    Procedure: [facility] STD PREP LIVING DONOR RENAL ALLGRFT (OPEN/LAPROSC) PRIOR TO TRANSPLANT, INC DISSECT/REM AS NECESS;  Surgeon: Marsa Sam Boss, MD;  Location: MAIN OR [facility];  Service: Transplant    PR TRANSPLANTATION OF KIDNEY N/A 06/22/2019    Procedure: RENAL ALLOTRANSPLANTATION, IMPLANTATION OF GRAFT; WITHOUT RECIPIENT NEPHRECTOMY;  Surgeon: Marsa Sam Boss, MD;  Location: MAIN  OR [place];  Service: Transplant    SPLENECTOMY     [4]   Family History  Problem Relation Age of Onset    Kidney disease Mother     Cancer Father     Kidney disease Brother    [5]   Social History  Tobacco Use    Smoking status: Never    Smokeless tobacco: Never   Vaping Use    Vaping status: Never Used   Substance Use Topics    Alcohol use: No    Drug use: No

## 2024-07-21 NOTE — Plan of Care (Signed)
 Shift Summary  Vital signs stable. Excellent oral intake and urinary output. No reported pain throughout the shift.   Ambulation occurred twice with assistance, though one opportunity was missed due to a scheduling conflict.  No falls or injuries occurred, and all safety interventions were maintained throughout the shift.  Overall, the patient remained comfortable, engaged with care, and continued to progress toward transition goals.    Problem: Adult Inpatient Plan of Care  Goal: Optimal Comfort and Wellbeing  Outcome: Shift Focus  Goal: Readiness for Transition of Care  Outcome: Shift Focus     Problem: Fall Injury Risk  Goal: Absence of Fall and Fall-Related Injury  Outcome: Shift Focus  Intervention: Promote Injury-Free Environment  Recent Flowsheet Documentation  Taken 07/21/2024 0800 by Merilee Powell POUR, RN  Safety Interventions:   fall reduction program maintained   family at bedside   low bed   nonskid shoes/slippers when out of bed     Problem: Skin Injury Risk Increased  Goal: Skin Health and Integrity  Outcome: Shift Focus     Problem: Wound  Goal: Optimal Functional Ability  Outcome: Shift Focus     Problem: Adult Inpatient Plan of Care  Goal: Absence of Hospital-Acquired Illness or Injury  Intervention: Identify and Manage Fall Risk  Recent Flowsheet Documentation  Taken 07/21/2024 0800 by Merilee Powell POUR, RN  Safety Interventions:   fall reduction program maintained   family at bedside   low bed   nonskid shoes/slippers when out of bed

## 2024-07-21 NOTE — Consults (Signed)
 Transplant Nephrology Consult     Requesting Attending Physician :  Rosaria CHRISTELLA Lush, MD  Service Requesting Consult : Neurosurgery [facility])  Reason for Consult: kidney transplant recipient, assistance with immunosuppression management    Assessment and Plan:     # S/p Kidney Transplant, Kidney allograft function (stable):  - Serum creatinine remains within baseline of approximately 1.7-1.9.    # Immunosuppression:  - Off mycophenolate  in setting of malignancy   - Envarsus  increased to 7 mg daily (i12/15) in setting of lower tac troughs  - Please obtain trough tacrolimus  trough levels prior to the morning dose of the medication and approximately 24hrs after prior dose. The goal tacrolimus  level is 4-6 ng/mL. Given recent dose change, would continue same dose.   - Once the patient has completed his course of dexamethasone , would start prednisone  5 mg daily.    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 Do not expect nephrotoxic effect with this chemo regimen.     RECOMMENDATIONS:   - No changes to current immunosuppression  - Transplant patients with an open wound require wound care with sterile water  only. The patient should be counseled on this at the time of discharge if they have not already been doing this.  - We will continue to follow.     Marcellus MARLA Kitty, MD  07/21/2024 1:41 PM     Medical decision-making for 07/21/24  Findings / Data     Patient has: []  acute illness w/systemic sxs  [mod]  []  two or more stable chronic illnesses [mod]  []  one chronic illness with acute exacerbation [mod]  []  acute complicated illness  [mod]  []  Undiagnosed new problem with uncertain prognosis  [mod] [x]  illness posing risk to life or bodily function (ex. AKI)  [high]  []  chronic illness with severe exacerbation/progression  [high]  []  chronic illness with severe side effects of treatment  [high] Kidney transplant recipient, brain mass Probs At least 2:  Probs, Data, Risk   I reviewed: []  primary team note  []  consultant note(s)  []  external records [x]  chemistry results  [x]  CBC results  []  blood gas results  []  Other []  procedure/op note(s)   [x]  radiology report(s)  []  micro result(s)  []  w/ independent historian(s) Cr at baseline, reviewed primary team notes  >=3 Data Review (2 of 3)    I independently interpreted: []  Urine Sediment  []  Renal US  []  CXR Images  []  CT Images  []  Other []  EKG Tracing  Any     I discussed: []  Pathology results w/ QHPs(s) from other specialties  []  Procedural findings w/ QHPs(s) from other specialties []  Imaging w/ QHP(s) from other specialties  [x]  Treatment plan w/ QHP(s) from other specialties Plan discussed with primary team Any     Mgm't requires: []  Prescription drug(s)  [mod]  []  Kidney biopsy  [mod]  []  Central line placement  [mod] [x]  High risk medication use and/or intensive toxicity monitoring [high]  []  Renal replacement therapy [high]  []  High risk kidney biopsy  [  high]  []  Escalation of care  [high]  []  High risk central line placement  [high] Immunosuppression: high risk for infection Risk      _____________________________________________________________________________________      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 04/2016 and right 12/2016)  Post-Transplant Course: HD once for hyperkalemia early after transplant  Prior Transplants: none  Induction: alemtuzumab  Date of Ureteral Stent Removal: 07/29/2019  CMV and EBV Serologies: CMV D+/R+, EBV D+/R  Rejection Episodes: 01/2022 kidney biopsy showed mild focal tubulitis, patien twas treated with solumedrol 125 mg IV x3 and steroid taper  Donor Specific Antibodies: none    Interval events: No acute events overnight. Feeling well today. Awaiting AIR.     INPATIENT MEDICATIONS:  Current Medications[1]    Physical Exam:   Vitals:    07/21/24 0342 07/21/24 0800 07/21/24 0859 07/21/24 1230   BP: 129/71 (!) 153/64 119/59 123/69   Pulse: 50 55  52   Resp: 16 16  16    Temp: 35.9 ??C (96.6 ??F) 36.7 ??C (98.1 ??F)  36.5 ??C (97.7 ??F)   TempSrc: Temporal Axillary  Oral   SpO2: 96%  97% 98%   Weight:       Height:         I/O this shift:  In: 120 [P.O.:120]  Out: -     Intake/Output Summary (Last 24 hours) at 07/21/2024 1341  Last data filed at 07/21/2024 0800  Gross per 24 hour   Intake 120 ml   Output --   Net 120 ml         Constitutional: no acute distress, sitting in recliner  Heart: regular rate and rhythm  Lungs: normal WOB, on room air  Abd: soft, non-distended  Ext: no lower extremity edema    Neuro: awake, alert                 [1]   Current Facility-Administered Medications:     acetaminophen  (TYLENOL ) tablet 650 mg, Oral, Q6H PRN    [Provider Hold] amlodipine  (NORVASC ) tablet 10 mg, Oral, Daily    atorvastatin  (LIPITOR ) tablet 40 mg, Oral, Daily    [COMPLETED] dexAMETHasone  (DECADRON ) tablet 4 mg, Oral, Q6H SCH **FOLLOWED BY** [COMPLETED] dexAMETHasone  (DECADRON ) tablet 2 mg, Oral, Q6H SCH **FOLLOWED BY** [COMPLETED] dexAMETHasone  (DECADRON ) tablet 2 mg, Oral, Q8H SCH **FOLLOWED BY** dexAMETHasone  (DECADRON ) tablet 2 mg, Oral, Q12H SCH **FOLLOWED BY** [DISCONTINUED] dexAMETHasone  (DECADRON ) tablet 1 mg, Oral, Q12H SCH    dextrose  50 % in water  (D50W) 50 % solution 12.5 g, Intravenous, Q15 Min PRN    famotidine  (PEPCID ) tablet 20 mg, Oral, Daily    heparin  (porcine) 5,000 unit/mL injection 5,000 Units, Subcutaneous, Q8H SCH    hydrALAZINE  (APRESOLINE ) injection 10 mg, Intravenous, Q4H PRN    insulin  lispro (HumaLOG ) inj PERCENTAGE MEAL EATEN 6 Units, Subcutaneous, 3xd Meals    insulin  lispro (HumaLOG ) injection CORRECTIONAL 0-20 Units, Subcutaneous, ACHS    insulin  NPH (HumuLIN ,NovoLIN ) injection 8 Units, Subcutaneous, Q12H SCH    labetalol (NORMODYNE) injection, Intravenous, Q4H PRN    melatonin tablet 3 mg, Oral, Nightly PRN    senna (SENOKOT) tablet 2 tablet, Oral, Nightly    tacrolimus  (ENVARSUS  XR) extended release tablet 7 mg, Oral, Daily    valsartan  (DIOVAN ) tablet 160 mg, Oral, Daily

## 2024-07-21 NOTE — Progress Notes (Signed)
 [facility]   INPATIENT PROGRESS NOTE      Brief History of Present Illness  Frank Leach is a 73 y.o. male with RCC/renal transplant now found to have a left frontal brain mass now s/p left craniotomy for resection (12/5).    Subjective/Interval History  Still pending AIR. No events overnight.     Interval Imaging Reviewed  None    Neurological Assessment and Plan  **Frontal mass of brain  POD10 Left craniotomy for resection of brain mass (12/5)  - SBP < 160; Na > 135  - dex taper to 2mg  BID  - Keppra  500 BID x 7 days  - Continue tacrolimus  per nephro   - HOB > 30  - PT/OT  - Pending AIR  - Will also pursue DC home with home health    Anticoagulant/Antiplatelet needs: ASA- hold    Disposition: floor status    ___________________________________________________________________    Neurological Exam  EOSp  Ox3  PERRL  EOMI  FS  TML  RUE 4  LUE 5  RLE 4++  LLE 5    Incision c/d/I staples     The patient's vitals, intake/output, labs, orders, and relevant imaging were reviewed for the last 24 hours.    Problem List  Principal Problem:    Frontal mass of brain

## 2024-07-21 NOTE — H&P (Incomplete)
 ***THIS NOTE DOES NOT MEAN PATIENT HAS BEEN ADMITTED TO [facility]. PENDING CLEARANCE BY ATTENDING OR INSURANCE***    Physical Medicine and Rehab  Post-admission Physician Evaluation (PAPE)    ASSESSMENT:     Frank Leach is a 73 y.o. male with PMH of ***. He is now admitted to [facility] for comprehensive interdisciplinary rehabilitation.     Rehab Impairment Group Code [facility]): (Brain Dysfunction) 02.1 Non-Traumatic (Brain Dysfunction) 02.1 Non-Traumatic  Etiology: RCC left frontal brain mass    PLAN:     This patient is admitted to the Physical Medicine and Rehabilitation - Inpatient - D service from 8am-5pm on weekdays for questions regarding this patient. After hours, weekends, and holidays please contact the 1st Call resident pager     REHAB:   - PT and OT to maximize functional status with mobility and ADLs as well as prevention of joint contracture.   - SLP for cognitive and swallow function.  - Neuropsych for higher level cognitive evaluation and coping.  - RT for community re-integration, education, and leisure support services.  - To be discussed in weekly Interdisciplinary Team Conference.    Glioblastoma   Procedure date: 07/11/24; Dr. Zettie  Frontal mass status post resection with Neurosurgery.   - dexamethasone  2mg  Q12 hours *** taper??  - famotidine  20mg  daily   - Tylenol  650mg  Q6hrs PRN  [ ]  neurosurgery follow up  ***  - Management per primary team  - Plan for temozolomide  75 mg/m2 oral daily for 3 weeks with adjuvant focal radiation Monday to Friday for 3 weeks followed by 6 cycles of adjuvant TMZ. Do not expect nephrotoxic effect with this chemo regimen.       HTN  - HOLD amlodipine  10mg  daily   - atorvastatin  40mg  daily   - valsartan  160mg  daily     S/p DDKT  Baseline creatining 1.7-1.9; stable. Off mycophenolate  in the setting of malignancy as above. Continued on tacrolimus .  - tacrolimus  7mg  daily   - tacrolimus  trough; goal level 4-6 ng/mL  - after completion of dexamethasone  as above, start prednisone  5mg  daily     T2DM  - NPH 8 units Q12 hrs  - Lispro 6 units TID w/meals  - SSI    Daily Checklist  - Diet: regular  - DVT PPX: SQ heparin   - GI PPX: famotidine    - Access:   Patient Lines/Drains/Airways Status       Active Active Lines, Drains, & Airways       Name Placement date Placement time Site Days    Peripheral IV 07/11/24 Left Hand 07/11/24  1120  Hand  10                  - Follow up: PM&R Outpatient Follow up: {follow up pmr:114907}    Code status: ***    Care Coordination: As part of the admission process, I discussed medical management of this patient???s case with ***.      DISPO: Admitted to Rehab floor. Patient will be discussed at next interdisciplinary team conference.     Estimated Length of Stay: *** days    Anticipated Post-Rehab Destination / Needs: home ***    Medical Necessity: ***    SUBJECTIVE:     Reason for Admission: Comprehensive interdisciplinary inpatient rehabilitation program.    History of Present Illness:     Etiologic Diagnosis / Description: Frank Leach is a 73 y.o. male with a past medical history of RCC s/p bilateral nephrectomy, DDKT,  ESRD on HD  admitted to [facility] on  07/03/2024 for impaired mobility found to have a new brain mass, now s/p left craniotomy for resection (12/5).Frank Leach has participated in acute inpatient physical and occupational therapies to improve functional mobility, activity tolerance, functional strength, balance, and endurance in order to facilitate safe performance of ADLs and daily routines. Frank Leach has been referred to [facility] for continued acute medical management, provision of intensive inpatient therapies, and patient/family training to facilitate safe performance of ADLs and mobility, prior to discharge home.       Today, ***. Patient denies having pain. Denies fever/sweats/chills. No recent weight changes or extremity swelling. Denies HA, dizziness, vision changes, changes in strength or sensation, issues with balance or coordination. Denies chest pain, palpitations, SOB, cough. Eating well tolerating PO, no difficulty or pain with swallowing. No N/V or abdominal pain. No issues with incontinence. BMs regular, no constipation or diarrhea. No difficulty urinating, leaking urine, dysuria. Patient sleeping well. No issues with mood.    Pre-Morbid Functional Status: Pre-Admission Functional Status:   independent at baseline without a device    Activities of Daily Living:        Feeding : Min assist    Grooming: Contact Guard assist; Verbal assist/cues required (pt performs oral cares standing at sink with mod VCs to utilize RUE throughout--pt able to unscrew toothpaste with R hand, and hold toothbrush in R hand while moving head for oral cares)    Bathing: Min assist    Toileting: Mod assist    UB Dressing: Mod assist    LB Dressing: Mod assist         Mobility:   Bed Mobility comments: supine to sitting EOB with minA for RLE management, HOB elevated, use of handrail       Ambulation comments: patient ambulated x80 feet, x3 sets with RW; inconsistent R foot clearance    Stairs: NT         Cognition/Swallow/Speech:  Patient's Vision Adequate to Safely Complete Daily Activities: Recent Change    Patient's Judgement Adequate to Safely Complete Daily Activities: Recent Change    Patient's Memory Adequate to Safely Complete Daily Activities: Recent Change    Patient Able to Express Needs/Desires: Yes    Patient has speech problem: No      DME Recommendations:  PT DME Recommendations: Defer to post acute         Willingness to Participate: Pt able and willing to participate in 3 hours of therapy.      Requires modified schedule: Pt may require rest breaks between therapy sessions      Assistive Devices: Bedside commode, Rolling walker, Straight cane       Precautions:  Falls    Medical / Surgical History: Reviewed  Past Medical History[1]  Past Surgical History[2]  Social History: Reviewed  Short Social History[3]  Living Environment: House  Lives With: Spouse  Home Living: One level home, Tub/shower unit, Hand-held shower hose, Handicapped height toilet, Grab bars in shower, Grab bars around toilet, Stairs to enter with rails, Shower chair without back  Rail placement (outside): Bilateral rails in reach  Number of Stairs to Enter (outside): 2  Rail placement (inside): Rail on right side  Number of Stairs to Alternate level (inside): 2  Caregiver Identified?: Yes  Caregiver Availability: Intermittent  Caregiver Ability: Limited lifting    Family History: Pertinent as stated and otherwise reviewed and non-contributory   family history includes Cancer in his father;  Kidney disease in his brother and mother.    Allergies: Reviewed  Patient has no known allergies.    Medications at Discharge from Acute Hospital: Reviewed     Your Medication List        ASK your doctor about these medications      acetaminophen  500 MG tablet  Commonly known as: TYLENOL   Take 1-2 tablets (500-1,000 mg total) by mouth every six (6) hours as needed for pain or fever (> 38C).     allopurinol  100 MG tablet  Commonly known as: ZYLOPRIM   Take 1 tablet (100 mg total) by mouth every evening.     amlodipine  10 MG tablet  Wait to take this until your doctor or other care provider tells you to start again.  Commonly known as: NORVASC   Take 1 tablet (10 mg total) by mouth daily.     atorvastatin  40 MG tablet  Commonly known as: LIPITOR   Take 1 tablet (40 mg total) by mouth daily.     dexAMETHasone  1 MG tablet  Commonly known as: DECADRON   Take 4 tablets (4 mg total) by mouth every twelve (12) hours for 3 days, THEN 4 tablets (4 mg total) daily for 3 days, THEN 2 tablets (2 mg total) daily for 3 days, THEN 1 tablet (1 mg total) daily for 3 days.  Start taking on: July 08, 2024  Ask about: Should I take this medication?     ENVARSUS  XR 1 mg Tb24 extended release tablet  Take 1 tablet (1 mg total) by mouth in the morning. Take one tablet daily with ONE 4 mg tablet for a total daily dose of 5mg .     ENVARSUS  XR 4 mg Tb24 extended release tablet  Generic drug: tacrolimus   Take 1 tablet (4 mg total) by mouth daily. Take in addition to ONE 1 mg tablets for a total daily dose of 5 mg.     flu vac 2025 [id number](PF) 45 mcg (15 mcg x 3)/0.5 mL IM syringe  Commonly known as: FLUAD   Inject 0.5 mL into the muscle once for 1 dose.  Ask about: Should I take this medication?     latanoprost 0.005 % ophthalmic solution  Commonly known as: XALATAN  Administer 1 drop to both eyes nightly.     magnesium  (amino acid chelate) 133 mg tablet  Generic drug: magnesium  oxide-Mg AA chelate  Take 1 tablet by mouth two (2) times a day.     metFORMIN  500 MG 24 hr tablet  Commonly known as: GLUCOPHAGE -XR  Take 1 tablet (500 mg total) by mouth in the morning.     mycophenolate  180 MG EC tablet  Wait to take this until your doctor or other care provider tells you to start again.  Commonly known as: MYFORTIC   Take 2 tablets (360 mg total) by mouth two (2) times a day.     olmesartan  40 MG tablet  Wait to take this until your doctor or other care provider tells you to start again.  Commonly known as: BENICAR   Take 1 tablet (40 mg total) by mouth daily.     ondansetron  8 MG tablet  Commonly known as: ZOFRAN   Take 1 tablet (8 mg total) by mouth every eight (8) hours as needed for nausea. Take 1 tablet 1 hour before the chemotherapy (temozolomide ).     predniSONE  5 MG tablet  Commonly known as: DELTASONE   Take 1 tablet (5 mg total) by mouth daily.     temozolomide  140 mg capsule  Commonly known as: TEMODAR   Take 1 capsule (140 mg total) by mouth daily .            Review of Systems:    Full 10 systems reviewed and negative, other than as noted in the HPI.    OBJECTIVE:     Vitals:  Temp:  [35.9 ??C (96.6 ??F)-37 ??C (98.6 ??F)] 36.6 ??C (97.9 ??F)  Pulse:  [50-57] 56  Resp:  [15-17] 15  BP: (119-153)/(59-72) 128/72  MAP (mmHg):  [77-92] 88  SpO2:  [95 %-98 %] 98 %      Physical Exam:  GEN: {jmb exam gen:67762::Lying in bed in NAD.}  HEENT: {jmb exam heent:68258::Atraumatic.,Normocephalic.,Moist mucous membranes.,Trachea midline.}  RESP: {jmb exam resp:68259::NWOB on RA.}  CV: {JB3 Cardiovascular Exam:31488::Regular rate and rhythm.}  GI: abd soft, NTND  GU: {GU:40256}  PSYCH: {psych affect:27461}  SKIN: see wound care flow sheet  MSK: {findings; ros extremities/msk:32048}  NEURO:  Mental Status: A&Ox *** attention intact, speech fluid and coherent, follows commands well and answers questions appropriately  Cranial Nerve: {JMBCranial Nerves:61183::visual acuity intact, no visual fields deficits, pupils equal, EOMI, facial sensation bilaterally, no facial droop, hearing grossly intact b/l, shoulder shrug full and equal, tongue protrudes midline}  Cerebellar: {Neuro cerebellar:31840}  Sensory: {sensory pmr:114391::Sensation is intact to light touch in bilateral upper and lower extremities}  REFLEXES:{ekh MM reflexes:48327::Not Tested}   Motor:   - RUE ***/5 shoulder abd, ***/5 EF, ***/5 EE, ***/5 WE, ***/5 HG  - LUE ***/5 shoulder abd, ***/5 EF, ***/5 EE, ***/5 WE, ***/5 HG  - RLE ***/5 HF, ***/5 KE, ***/5 DF, ***/5 EHL, ***/5 PF  - LLE ***/5 HF, ***/5 KE, ***/5 DF, ***/5 EHL, ***/5 PF    Spasticity:  - {JB3 Modified Ashworth Scale Score:33323} of {Extremity LUE/RUE/LLE/RLE:25436::***}  - Recommend passive gentle ROM of {Side:55589}    Labs and Diagnostic Studies:    Personally reviewed ***  CBC - Results in Past 30 Days  Result Component Current Result Ref Range Previous Result Ref Range   HCT 36.9 (L) (07/19/2024) 39.0 - 48.0 % 39.0 (07/17/2024) 39.0 - 48.0 %   HGB 12.0 (L) (07/19/2024) 12.9 - 16.5 g/dL 87.3 (L) (87/88/7974) 87.0 - 16.5 g/dL   MCH 70.7 (87/86/7974) 25.9 - 32.4 pg 29.1 (07/17/2024) 25.9 - 32.4 pg   MCHC 32.4 (07/19/2024) 32.0 - 36.0 g/dL 67.6 (87/88/7974) 67.9 - 36.0 g/dL   MCV 09.8 (87/86/7974) 77.6 - 95.7 fL 90.0 (07/17/2024) 77.6 - 95.7 fL   MPV 9.8 (07/19/2024) 6.8 - 10.7 fL 9.4 (07/17/2024) 6.8 - 10.7 fL   Platelet 251 (07/19/2024) 150 - 450 10*9/L 261 (07/17/2024) 150 - 450 10*9/L   RBC 4.09 (L) (07/19/2024) 4.26 - 5.60 10*12/L 4.33 (07/17/2024) 4.26 - 5.60 10*12/L   WBC 15.4 (H) (07/19/2024) 3.6 - 11.2 10*9/L 20.3 (H) (07/17/2024) 3.6 - 11.2 10*9/L     BMP - Results in Past 30 Days  Result Component Current Result Ref Range Previous Result Ref Range   BUN 32 (H) (07/19/2024) 9 - 23 mg/dL 36 (H) (87/88/7974) 9 - 23 mg/dL   Chloride 891 (H) (87/86/7974) 98 - 107 mmol/L 109 (H) (07/17/2024) 98 - 107 mmol/L   CO2 21.0 (07/19/2024) 20.0 - 31.0 mmol/L 26.0 (07/17/2024) 20.0 - 31.0 mmol/L   Creatinine 1.64 (H) (07/19/2024) 0.73 - 1.18 mg/dL 8.27 (H) (87/88/7974) 9.26 - 1.18 mg/dL   Glucose 777 (H) (87/86/7974) 70 - 179 mg/dL 833 (87/88/7974) 70 - 820 mg/dL   Potassium 4.5 (87/86/7974) 3.4 - 4.8  mmol/L 4.7 (07/17/2024) 3.4 - 4.8 mmol/L   Sodium 142 (07/19/2024) 135 - 145 mmol/L 147 (H) (07/17/2024) 135 - 145 mmol/L     LFT's - Results in Past 30 Days  Result Component Current Result Ref Range Previous Result Ref Range   Albumin 2.9 (L) (07/19/2024) 3.4 - 5.0 g/dL 3.1 (L) (87/88/7974) 3.4 - 5.0 g/dL   Alkaline Phosphatase 78 (07/03/2024) 46 - 116 U/L Not in Time Range    ALT 17 (07/03/2024) 10 - 49 U/L Not in Time Range    AST 20 (07/03/2024) <=34 U/L Not in Time Range    Total Bilirubin 0.2 (L) (07/03/2024) 0.3 - 1.2 mg/dL Not in Time Range        Labs and Diagnostic Studies: Reviewed    Radiology Results: Reviewed    SEVERITY  *** stable/watcher  [Inpatient ***]  - if *** --> then ***  - AD: baseline SBP  - Bowel/Bladder    SUMMARY  (one liner w CC and comorbidities)  Exam:    PT/OT/SLP/RT ***  [ ]  EDD: ?    TO DO  [ ]  POC *** by day 3  [ ]  admission med rec review by day 1  [ ]  code status review by day 1  [ ]  tubes/lines/drains  [ ]  staples/stiches removal  [ ]  wound vac          [1]   Past Medical History:  Diagnosis Date    Anemia     Cancer    (CMS-HCC)     clear cell adenocarcinoma of left kidney    Diabetes mellitus (CMS-HCC)     Diabetic nephropathy (CMS-HCC)     ESRD (end stage renal disease) on dialysis    (CMS-HCC)     ESRD on dialysis (CMS-HCC)     Gout     Hyperlipidemia     Hypertension    [2]   Past Surgical History:  Procedure Laterality Date    AV FISTULA PLACEMENT      PR COLONOSCOPY FLX DX W/COLLJ SPEC WHEN PFRMD N/A 04/30/2018    Procedure: COLONOSCOPY, FLEXIBLE, PROXIMAL TO SPLENIC FLEXURE; DIAGNOSTIC, W/WO COLLECTION SPECIMEN BY BRUSH OR WASH;  Surgeon: Dorn Lynwood Lauth, MD;  Location: [facility]ED[facility];  Service: Gastroenterology    PR COLONOSCOPY W/BIOPSY SINGLE/MULTIPLE N/A 04/30/2018    Procedure: COLONOSCOPY, FLEXIBLE, PROXIMAL TO SPLENIC FLEXURE; WITH BIOPSY, SINGLE OR MULTIPLE;  Surgeon: Dorn Lynwood Lauth, MD;  Location: [facility];  Service: Gastroenterology    PR COLSC FLX W/RMVL OF TUMOR POLYP LESION SNARE TQ N/A 04/30/2018    Procedure: COLONOSCOPY FLEX; W/REMOV TUMOR/LES BY SNARE;  Surgeon: Dorn Lynwood Lauth, MD;  Location: [facility];  Service: Gastroenterology    PR EXCIS SUPRATENT BRAIN TUMOR Left 07/11/2024    Procedure: Left craniotomy for resection of mass;  Surgeon: Zettie Rosaria HERO, MD;  Location: OR [place];  Service: Neurosurgery    PR EXPLORATORY OF ABDOMEN Midline 04/14/2016    Procedure: EXPLORATORY LAPAROTOMY, EXPLORATORY CELIOTOMY WITH OR WITHOUT BIOPSY(S);  Surgeon: Marsa Sam Boss, MD;  Location: MAIN OR [facility];  Service: Transplant    PR EXPLORATORY OF ABDOMEN N/A 04/16/2016    Procedure: EXPLORATORY LAPAROTOMY, EXPLORATORY CELIOTOMY WITH OR WITHOUT BIOPSY(S);  Surgeon: Marsa Sam Boss, MD;  Location: MAIN OR [place];  Service: Transplant    PR IONM 1 ON 1 IN OR W/ATTENDANCE EACH 15 MINUTES N/A 07/11/2024    Procedure: CONTINUOUS INTRAOPERATIVE NEUROPHYSIOLOGY MONITORING IN OR;  Surgeon: Zettie Rosaria HERO, MD;  Location: OR [facility];  Service: Neurosurgery    PR LAP, RADICAL NEPHRECTOMY Right 12/27/2016    Procedure: Robotic Xi Laparoscopy; Radical Nephrectomy (Incl Remove Gerota`S Fascia, Fatty Tissue, Reg Lymph Node, Adrenalectomy);  Surgeon: Clorinda Ole Chalk, MD;  Location: MAIN OR [facility];  Service: Urology    PR LIGATN ANGIOACCESS AV FISTULA Left 11/29/2022    Procedure: LIGATION OR BANDING OF ANGIOACCESS ARTERIOVENOUS FISTULA;  Surgeon: [place], Marsa Messier, MD;  Location: MAIN OR [facility];  Service: Transplant    PR MICROSURG TECHNIQUES,REQ OPER MICROSCOPE N/A 07/11/2024    Procedure: MICROSURGICAL TECHNIQUES, REQUIRING USE OF OPERATING MICROSCOPE (LIST SEPARATELY IN ADDITION TO CODE FOR PRIMARY PROCEDURE);  Surgeon: Zettie Rosaria HERO, MD;  Location: OR [facility];  Service: Neurosurgery    PR NEGATIVE PRESSURE WOUND THERAPY DME >50 SQ CM N/A 04/12/2016    Procedure: NEG PRESS WOUND TX (VAC ASSIST) INCL TOPICALS, PER SESSION, TSA GREATER THAN/= 50 CM SQUARED;  Surgeon: Marsa Messier Boss, MD;  Location: MAIN OR [place];  Service: Transplant    PR NEGATIVE PRESSURE WOUND THERAPY DME >50 SQ CM Bilateral 04/14/2016    Procedure: NEG PRESS WOUND TX (VAC ASSIST) INCL TOPICALS, PER SESSION, TSA GREATER THAN/= 50 CM SQUARED;  Surgeon: Marsa Messier Boss, MD;  Location: MAIN OR [place];  Service: Transplant    PR NEGATIVE PRESSURE WOUND THERAPY DME >50 SQ CM N/A 04/16/2016    Procedure: NEG PRESS WOUND TX (VAC ASSIST) INCL TOPICALS, PER SESSION, TSA GREATER THAN/= 50 CM SQUARED;  Surgeon: Marsa Messier Boss, MD;  Location: MAIN OR [facility];  Service: Transplant    PR NEPHRECTOMY, W/PART. URETECTOMY Bilateral 04/11/2016    Procedure: LAPAROSCOPY, SURGICAL, NEPHRECTOMY WITH TOTAL URETERECTOMY;  Surgeon: Marsa Messier Boss, MD;  Location: MAIN OR [facility];  Service: Transplant    PR REMV KIDNEY,W/RIB RESECTION Bilateral 04/11/2016    Procedure: NEPHRECTOMY, INCLUDING PARTIAL URETERECTOMY, ANY OPEN APPROACH INCLUDING RIB RESECTION;  Surgeon: Marsa Messier Boss, MD;  Location: MAIN OR [facility]; Service: Transplant    PR STEREOTACTIC COMP ASSIST PROC,CRANIAL,INTRADURAL N/A 07/11/2024    Procedure: STEREOTACTIC COMPUTER-ASSISTED (NAVIGATIONAL) PROCEDURE; CRANIAL, INTRADURAL;  Surgeon: Zettie Rosaria HERO, MD;  Location: OR [facility];  Service: Neurosurgery    PR TRANSPLANT,PREP LIVING  RENAL GRAFT N/A 06/22/2019    Procedure: [facility] STD PREP LIVING DONOR RENAL ALLGRFT (OPEN/LAPROSC) PRIOR TO TRANSPLANT, INC DISSECT/REM AS NECESS;  Surgeon: Marsa Messier Boss, MD;  Location: MAIN OR [facility];  Service: Transplant    PR TRANSPLANTATION OF KIDNEY N/A 06/22/2019    Procedure: RENAL ALLOTRANSPLANTATION, IMPLANTATION OF GRAFT; WITHOUT RECIPIENT NEPHRECTOMY;  Surgeon: Marsa Messier Boss, MD;  Location: MAIN OR [facility];  Service: Transplant    SPLENECTOMY     [3]   Social History  Tobacco Use    Smoking status: Never    Smokeless tobacco: Never   Vaping Use    Vaping status: Never Used   Substance Use Topics    Alcohol use: No    Drug use: No

## 2024-07-22 ENCOUNTER — Inpatient Hospital Stay
Admission: EM | Admit: 2024-07-22 | Discharge: 2024-07-31 | Disposition: A | Discharge status: Home Health | Payer: Medicare (Managed Care) | Source: Intra-hospital | Admitting: Internal Medicine

## 2024-07-22 DIAGNOSIS — C719 Malignant neoplasm of brain, unspecified: Principal | ICD-10-CM

## 2024-07-22 LAB — TACROLIMUS LEVEL, TROUGH: TACROLIMUS, TROUGH: 2.9 ng/mL — ABNORMAL LOW (ref 5.0–15.0)

## 2024-07-22 MED ORDER — DEXAMETHASONE 2 MG TABLET
ORAL_TABLET | Freq: Two times a day (BID) | ORAL | 0 refills | 30.00000 days | Status: SS
Start: 2024-07-22 — End: 2024-08-21

## 2024-07-22 MED ORDER — HEPARIN (PORCINE) 5,000 UNIT/ML INJ SOLUTION (MULTI-VIAL SIZE WRAPPER)
Freq: Three times a day (TID) | SUBCUTANEOUS | 0 refills | 1.00000 days | Status: SS
Start: 2024-07-22 — End: ?

## 2024-07-22 MED ORDER — TACROLIMUS XR 1 MG TABLET,EXTENDED RELEASE 24 HR
ORAL_TABLET | Freq: Every day | ORAL | 0 refills | 30.00000 days | Status: SS
Start: 2024-07-22 — End: 2024-08-21

## 2024-07-22 MED ORDER — FAMOTIDINE 20 MG TABLET
ORAL_TABLET | Freq: Every day | ORAL | 0 refills | 90.00000 days | Status: SS
Start: 2024-07-22 — End: 2024-10-20

## 2024-07-22 MED ORDER — SENNOSIDES 8.6 MG TABLET
ORAL_TABLET | Freq: Every evening | ORAL | 2 refills | 30.00000 days | Status: SS
Start: 2024-07-22 — End: 2024-10-20

## 2024-07-22 MED ADMIN — heparin (porcine) 5,000 unit/mL injection 5,000 Units: 5000 [IU] | SUBCUTANEOUS | @ 10:00:00 | Stop: 2024-07-22

## 2024-07-22 MED ADMIN — heparin (porcine) 5,000 unit/mL injection 5,000 Units: 5000 [IU] | SUBCUTANEOUS | @ 02:00:00

## 2024-07-22 MED ADMIN — famotidine (PEPCID) tablet 20 mg: 20 mg | ORAL | @ 14:00:00 | Stop: 2024-07-22

## 2024-07-22 MED ADMIN — heparin (porcine) 5,000 unit/mL injection 5,000 Units: 5000 [IU] | SUBCUTANEOUS | @ 19:00:00

## 2024-07-22 MED ADMIN — insulin lispro (HumaLOG) injection CORRECTIONAL 0-20 Units: 0-20 [IU] | SUBCUTANEOUS | @ 22:00:00

## 2024-07-22 MED ADMIN — insulin NPH (HumuLIN,NovoLIN) injection 8 Units: 8 [IU] | SUBCUTANEOUS | @ 02:00:00

## 2024-07-22 MED ADMIN — tacrolimus (ENVARSUS XR) extended release tablet 7 mg: 7 mg | ORAL | @ 14:00:00 | Stop: 2024-07-22

## 2024-07-22 MED ADMIN — atorvastatin (LIPITOR) tablet 40 mg: 40 mg | ORAL | @ 14:00:00 | Stop: 2024-07-22

## 2024-07-22 MED ADMIN — valsartan (DIOVAN) tablet 160 mg: 160 mg | ORAL | @ 14:00:00 | Stop: 2024-07-22

## 2024-07-22 MED ADMIN — insulin lispro (HumaLOG) inj PERCENTAGE MEAL EATEN 4 Units: 4 [IU] | SUBCUTANEOUS | @ 22:00:00

## 2024-07-22 MED ADMIN — dexAMETHasone (DECADRON) tablet 2 mg: 2 mg | ORAL | @ 14:00:00 | Stop: 2024-07-22

## 2024-07-22 MED ADMIN — dexAMETHasone (DECADRON) tablet 2 mg: 2 mg | ORAL | @ 02:00:00

## 2024-07-22 MED ADMIN — insulin lispro (HumaLOG) inj PERCENTAGE MEAL EATEN 4 Units: 4 [IU] | SUBCUTANEOUS | @ 14:00:00 | Stop: 2024-07-22

## 2024-07-22 NOTE — Consults (Signed)
 Endocrine Team Diabetes Follow Up Consult Note     Consult information:  Requesting Attending Physician : Frank CHRISTELLA Lush, MD  Service Requesting Consult : Neurosurgery [facility])  Primary Care Provider: Kandis Stefano Houston, MD  Impression:  Frank Leach is a 73 y.o. male admitted for brain lesion. We have been consulted at the request of Frank CHRISTELLA Lush, MD to evaluate [name] for hyperglycemia.     Medical Decision Making:  Diagnoses:  1.Type 2 Diabetes. Uncontrolled With hyperglycemia.  2. Nutrition: Complicating glycemic control. Increasing risk for both hypoglycemia and hyperglycemia.  3. Steroids. Complicating glycemic control and increasing risk for hyperglycemia.  4. Transplant. Complicating glycemic control and increasing risk for hyperglycemia.  5. Chronic Kidney Disease. Complicating glycemic control and increasing risk for hypoglycemia.        Studies reviewed 07/22/24:  Labs: CBC, BMP, POCT-BG, and HbA1C  Interpretation: +Leukocytosis. Normal potassium. Elevated Cr in the setting of known CKD. Intermittent hyperglycemia. A1C 6.8% indicates excellent control outpatient.  Notes reviewed: Primary team and nursing notes      Overall impression based on above reviews and history:  Hyperglycemia in patient with known type 2 diabetes complicated by steroids. BG doing well overall, but as steroids have tapered will reduce both basal and nutritional doses.       Steroid taper:  12/7: dexamethasone  4mg  q6h  12/9: dexamethasone  2mg  q6h  12/12: dexamethasone  2mg  q8h  12/14: dexamethasone  2mg  q12h    Recommendations:  - NPH 5u q12h  - lispro 4u qAC  - lispro target 140, ISF 20 achs  - Hypoglycemia protocol.  - POCT-BG achs.  - Ensure patient is on glucose precautions if patient taking nutrition by mouth.     Discharge planning:  Discharge to [facility] on current regimen. Current plan is to stay on dexamethasone  2mg  q12h. If steroids taper will likely require reductions.     Thank you for this consult. Discussed plan with primary team. We will continue to follow and make recommendations and place orders as appropriate.    Please page with questions or concerns: Frank Test, NP: [phone]  DCT on call from 6AM - 3PM on weekdays then endocrine fellow on call: [phone] from 3PM - 6AM on weekdays and on weekends and holidays.   If APP cannot be reached, please page the endocrine fellow on call.      Subjective:  Interval history: Reports decent po intake. Denies complaints at time of rounds. To go to AIR today.    Initial HPI:  Frank Leach is a 73 y.o. 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     Diabetes History:  Patient has a history of Type 2 diabetes diagnosed at least 15+ years ago.  Diabetes is managed by: PCP, nephrologist.  Current home diabetes regimen: metformin  XR 500mg  qAM,empagliflozin  25mg  qam.  Current home blood glucose monitoring:  occasionally.  Hypoglycemia awareness: yes.  Complications related to diabetes: ESRD s/p transplant in 2020      Current Nutrition:  Active Orders   Diet    Nutrition Therapy Regular/House       ROS: As per HPI.    Scheduled Medications[1]    Current Outpatient Medications   Medication Instructions    acetaminophen  (TYLENOL ) 500-1,000 mg, Oral, Every 6 hours PRN  allopurinol  (ZYLOPRIM ) 100 mg, Oral, Every evening    [Paused] amlodipine  (NORVASC ) 10 mg, Oral, Daily (standard)    atorvastatin  (LIPITOR ) 40 mg, Oral, Daily (standard)    ENVARSUS  XR 4 mg, Oral, Daily (standard), Take in addition to ONE 1 mg tablets for a total daily dose of 5 mg.    latanoprost (XALATAN) 0.005 % ophthalmic solution 1 drop, Nightly    magnesium  oxide-Mg AA chelate (MAGNESIUM , AMINO ACID CHELATE,) 133 mg 1 tablet, Oral, 2 times a day    metFORMIN  (GLUCOPHAGE -XR) 500 mg, Oral, Daily (RT)    [Paused] mycophenolate  (MYFORTIC ) 360 mg, Oral, 2 times a day (standard)    [Paused] olmesartan  (BENICAR ) 40 mg, Oral, Daily (standard)    ondansetron  (ZOFRAN ) 8 mg, Oral, Every 8 hours PRN, Take 1 tablet 1 hour before the chemotherapy (temozolomide ).    predniSONE  (DELTASONE ) 5 mg, Oral, Daily (standard)    tacrolimus  (ENVARSUS  XR) 1 mg Tb24 extended release tablet 1 tablet, Daily    temozolomide  (TEMODAR ) 75 mg/m2/day, Oral, Daily (standard)           Past Medical History[2]    Past Surgical History[3]    Family History[4]    Short Social History[5]    OBJECTIVE:  BP 135/72  - Pulse 50  - Temp 36.2 ??C (97.2 ??F) (Temporal)  - Resp 18  - Ht 170.2 cm (5' 7.01)  - Wt 73 kg (160 lb 15 oz)  - SpO2 97%  - BMI 25.20 kg/m??   Wt Readings from Last 12 Encounters:   07/13/24 73 kg (160 lb 15 oz)   07/03/24 72.6 kg (160 lb)   01/18/24 73.2 kg (161 lb 6.4 oz)   06/08/23 68.9 kg (151 lb 12.8 oz)   03/22/23 68.3 kg (150 lb 8 oz)   02/14/23 68.4 kg (150 lb 12.8 oz)   12/21/22 68 kg (149 lb 14.4 oz)   11/27/22 67.6 kg (149 lb)   11/02/22 68.1 kg (150 lb 3.2 oz)   10/13/22 68.2 kg (150 lb 6.4 oz)   06/02/22 67 kg (147 lb 9.6 oz)   02/08/22 67.4 kg (148 lb 9.6 oz)     Physical Exam  Vitals and nursing note reviewed.   Constitutional:       General: He is not in acute distress.     Appearance: Normal appearance.   Pulmonary:      Effort: Pulmonary effort is normal. No respiratory distress.   Skin:     General: Skin is warm and dry.   Neurological:      General: No focal deficit present.      Mental Status: He is alert and oriented to person, place, and time.   Psychiatric:         Mood and Affect: Mood normal.         Behavior: Behavior normal.         BG/insulin  reviewed per EMR.   Glucose, POC   Date Value   07/22/2024 140 mg/dL   87/84/7974 850 mg/dL   87/84/7974 896 mg/dL   87/84/7974 851 mg/dL   87/84/7974 878 mg/dL   87/84/7974 861 mg/dL   87/84/7974 857 mg/dL   87/85/7974 773 mg/dL (H)   89/76/7986 784 MG/DL (H)   90/74/7986 55 MG/DL (L)   90/74/7986 52 MG/DL (L)   93/78/7986 96 MG/DL   93/78/7986 844 MG/DL   93/78/7986 877 MG/DL   93/79/7986 768 MG/DL (H)   93/79/7986 91 MG/DL  Summary of labs:  Lab Results   Component Value Date    A1C 6.8 (H) 07/03/2024    A1C 6.2 (H) 01/07/2024    A1C 6.0 (H) 09/10/2023     Lab Results   Component Value Date    GFR 5.11 (L) 05/01/2012    CREATININE 1.64 (H) 07/19/2024     Lab Results   Component Value Date    WBC 15.4 (H) 07/19/2024    HGB 12.0 (L) 07/19/2024    HCT 36.9 (L) 07/19/2024    PLT 251 07/19/2024       Lab Results   Component Value Date    NA 142 07/19/2024    K 4.5 07/19/2024    CL 108 (H) 07/19/2024    CO2 21.0 07/19/2024    BUN 32 (H) 07/19/2024    CREATININE 1.64 (H) 07/19/2024    GLU 222 (H) 07/19/2024    CALCIUM 9.5 07/19/2024    MG 2.0 07/14/2024    PHOS 2.9 07/19/2024       Lab Results   Component Value Date    BILITOT 0.2 (L) 07/03/2024    BILIDIR 0.20 12/10/2023    PROT 7.2 07/03/2024    ALBUMIN 2.9 (L) 07/19/2024    ALT 17 07/03/2024    AST 20 07/03/2024    ALKPHOS 78 07/03/2024    GGT 20 06/21/2019                                 [1]    [Provider Hold] amlodipine   10 mg Oral Daily    atorvastatin   40 mg Oral Daily    dexAMETHasone   2 mg Oral Q12H SCH    famotidine   20 mg Oral Daily    heparin  (porcine) for subcutaneous use  5,000 Units Subcutaneous Q8H SCH    insulin  lispro  6 Units Subcutaneous 3xd Meals    insulin  lispro  0-20 Units Subcutaneous ACHS    insulin  NPH  8 Units Subcutaneous Q12H SCH    senna  2 tablet Oral Nightly    tacrolimus   7 mg Oral Daily    valsartan   160 mg Oral Daily   [2]   Past Medical History:  Diagnosis Date    Anemia     Cancer    (CMS-HCC)     clear cell adenocarcinoma of left kidney    Diabetes mellitus (CMS-HCC)     Diabetic nephropathy (CMS-HCC)     ESRD (end stage renal disease) on dialysis    (CMS-HCC)     ESRD on dialysis (CMS-HCC)     Gout     Hyperlipidemia     Hypertension    [3]   Past Surgical History:  Procedure Laterality Date    AV FISTULA PLACEMENT      PR COLONOSCOPY FLX DX W/COLLJ SPEC WHEN PFRMD N/A 04/30/2018    Procedure: COLONOSCOPY, FLEXIBLE, PROXIMAL TO SPLENIC FLEXURE; DIAGNOSTIC, W/WO COLLECTION SPECIMEN BY BRUSH OR WASH;  Surgeon: Dorn Lynwood Lauth, MD;  Location: [facility];  Service: Gastroenterology    PR COLONOSCOPY W/BIOPSY SINGLE/MULTIPLE N/A 04/30/2018    Procedure: COLONOSCOPY, FLEXIBLE, PROXIMAL TO SPLENIC FLEXURE; WITH BIOPSY, SINGLE OR MULTIPLE;  Surgeon: Dorn Lynwood Lauth, MD;  Location: [facility];  Service: Gastroenterology    PR COLSC FLX W/RMVL OF TUMOR POLYP LESION SNARE TQ N/A 04/30/2018    Procedure: COLONOSCOPY FLEX; W/REMOV TUMOR/LES BY SNARE;  Surgeon:  Dorn Lynwood Lauth, MD;  Location: [facility];  Service: Gastroenterology    PR EXCIS SUPRATENT BRAIN TUMOR Left 07/11/2024    Procedure: Left craniotomy for resection of mass;  Surgeon: Zettie Frank HERO, MD;  Location: OR [facility];  Service: Neurosurgery    PR EXPLORATORY OF ABDOMEN Midline 04/14/2016    Procedure: EXPLORATORY LAPAROTOMY, EXPLORATORY CELIOTOMY WITH OR WITHOUT BIOPSY(S);  Surgeon: Marsa Sam Boss, MD;  Location: MAIN OR [facility];  Service: Transplant    PR EXPLORATORY OF ABDOMEN N/A 04/16/2016    Procedure: EXPLORATORY LAPAROTOMY, EXPLORATORY CELIOTOMY WITH OR WITHOUT BIOPSY(S);  Surgeon: Marsa Sam Boss, MD;  Location: MAIN OR [facility];  Service: Transplant    PR IONM 1 ON 1 IN OR W/ATTENDANCE EACH 15 MINUTES N/A 07/11/2024    Procedure: CONTINUOUS INTRAOPERATIVE NEUROPHYSIOLOGY MONITORING IN OR;  Surgeon: Zettie Frank HERO, MD;  Location: OR [facility];  Service: Neurosurgery    PR LAP, RADICAL NEPHRECTOMY Right 12/27/2016    Procedure: Robotic Xi Laparoscopy; Radical Nephrectomy (Incl Remove Gerota`S Fascia, Fatty Tissue, Reg Lymph Node, Adrenalectomy);  Surgeon: Clorinda Ole Chalk, MD;  Location: MAIN OR [facility];  Service: Urology    PR LIGATN ANGIOACCESS AV FISTULA Left 11/29/2022    Procedure: LIGATION OR BANDING OF ANGIOACCESS ARTERIOVENOUS FISTULA;  Surgeon: Tol[place], Marsa Sam, MD;  Location: MAIN OR [facility];  Service: Transplant    PR MICROSURG TECHNIQUES,REQ OPER MICROSCOPE N/A 07/11/2024    Procedure: MICROSURGICAL TECHNIQUES, REQUIRING USE OF OPERATING MICROSCOPE (LIST SEPARATELY IN ADDITION TO CODE FOR PRIMARY PROCEDURE);  Surgeon: Zettie Frank HERO, MD;  Location: OR [facility];  Service: Neurosurgery    PR NEGATIVE PRESSURE WOUND THERAPY DME >50 SQ CM N/A 04/12/2016    Procedure: NEG PRESS WOUND TX (VAC ASSIST) INCL TOPICALS, PER SESSION, TSA GREATER THAN/= 50 CM SQUARED;  Surgeon: Marsa Sam Boss, MD;  Location: MAIN OR [place];  Service: Transplant    PR NEGATIVE PRESSURE WOUND THERAPY DME >50 SQ CM Bilateral 04/14/2016    Procedure: NEG PRESS WOUND TX (VAC ASSIST) INCL TOPICALS, PER SESSION, TSA GREATER THAN/= 50 CM SQUARED;  Surgeon: Marsa Sam Boss, MD;  Location: MAIN OR [place];  Service: Transplant    PR NEGATIVE PRESSURE WOUND THERAPY DME >50 SQ CM N/A 04/16/2016    Procedure: NEG PRESS WOUND TX (VAC ASSIST) INCL TOPICALS, PER SESSION, TSA GREATER THAN/= 50 CM SQUARED;  Surgeon: Marsa Sam Boss, MD;  Location: MAIN OR [facility];  Service: Transplant    PR NEPHRECTOMY, W/PART. URETECTOMY Bilateral 04/11/2016    Procedure: LAPAROSCOPY, SURGICAL, NEPHRECTOMY WITH TOTAL URETERECTOMY;  Surgeon: Marsa Sam Boss, MD;  Location: MAIN OR [facility];  Service: Transplant    PR REMV KIDNEY,W/RIB RESECTION Bilateral 04/11/2016    Procedure: NEPHRECTOMY, INCLUDING PARTIAL URETERECTOMY, ANY OPEN APPROACH INCLUDING RIB RESECTION;  Surgeon: Marsa Sam Boss, MD;  Location: MAIN OR [facility];  Service: Transplant    PR STEREOTACTIC COMP ASSIST PROC,CRANIAL,INTRADURAL N/A 07/11/2024    Procedure: STEREOTACTIC COMPUTER-ASSISTED (NAVIGATIONAL) PROCEDURE; CRANIAL, INTRADURAL;  Surgeon: Zettie Frank HERO, MD;  Location: OR [facility]; Service: Neurosurgery    PR TRANSPLANT,PREP LIVING  RENAL GRAFT N/A 06/22/2019    Procedure: [facility] STD PREP LIVING DONOR RENAL ALLGRFT (OPEN/LAPROSC) PRIOR TO TRANSPLANT, INC DISSECT/REM AS NECESS;  Surgeon: Marsa Sam Boss, MD;  Location: MAIN OR [facility];  Service: Transplant    PR TRANSPLANTATION OF KIDNEY N/A 06/22/2019    Procedure: RENAL ALLOTRANSPLANTATION, IMPLANTATION OF GRAFT; WITHOUT RECIPIENT NEPHRECTOMY;  Surgeon: Marsa Sam Boss, MD;  Location: MAIN OR [facility];  Service:  Transplant    SPLENECTOMY     [4]   Family History  Problem Relation Age of Onset    Kidney disease Mother     Cancer Father     Kidney disease Brother    [5]   Social History  Tobacco Use    Smoking status: Never    Smokeless tobacco: Never   Vaping Use    Vaping status: Never Used   Substance Use Topics    Alcohol use: No    Drug use: No

## 2024-07-22 NOTE — Consults (Signed)
 Transplant Nephrology Consult     Requesting Attending Physician :  Rosaria CHRISTELLA Lush, MD  Service Requesting Consult : Neurosurgery [facility])  Reason for Consult: kidney transplant recipient, assistance with immunosuppression management    Assessment and Plan:     # S/p Kidney Transplant, Kidney allograft function (stable):  - Serum creatinine remains within baseline of approximately 1.7-1.9 though last labs were on 12/13.    # Immunosuppression:  - Off mycophenolate  in setting of malignancy   - Envarsus  increased to 7 mg daily (12/15) in setting of lower tac troughs  - Please obtain trough tacrolimus  trough levels prior to the morning dose of the medication and approximately 24hrs after prior dose. The goal tacrolimus  level is 4-6 ng/mL. Dose increased on 12/15, will follow-up level from today    - Once the patient has completed his course of dexamethasone , would start prednisone  5 mg daily.    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 Do not expect nephrotoxic effect with this chemo regimen.     RECOMMENDATIONS:   - No changes to current immunosuppression, we will follow up today's tacrolimus  level   - Transplant patients with an open wound require wound care with sterile water  only. The patient should be counseled on this at the time of discharge if they have not already been doing this.  - We will continue to follow.     Tinnie JONETTA Kaufman, MD  07/22/2024 11:03 AM     Medical decision-making for 07/22/24  Findings / Data     Patient has: []  acute illness w/systemic sxs  [mod]  []  two or more stable chronic illnesses [mod]  []  one chronic illness with acute exacerbation [mod]  []  acute complicated illness  [mod]  []  Undiagnosed new problem with uncertain prognosis  [mod] [x]  illness posing risk to life or bodily function (ex. AKI)  [high]  []  chronic illness with severe exacerbation/progression  [high]  []  chronic illness with severe side effects of treatment  [high] Kidney transplant recipient, brain mass Probs At least 2:  Probs, Data, Risk   I reviewed: []  primary team note  []  consultant note(s)  []  external records [x]  chemistry results  [x]  CBC results  []  blood gas results  []  Other []  procedure/op note(s)   [x]  radiology report(s)  []  micro result(s)  []  w/ independent historian(s) Cr at baseline, reviewed primary team notes  >=3 Data Review (2 of 3)    I independently interpreted: []  Urine Sediment  []  Renal US  []  CXR Images  []  CT Images  []  Other []  EKG Tracing  Any     I discussed: []  Pathology results w/ QHPs(s) from other specialties  []  Procedural findings w/ QHPs(s) from other specialties []  Imaging w/ QHP(s) from other specialties  [x]  Treatment plan w/ QHP(s) from other specialties Plan discussed with primary team Any     Mgm't requires: []  Prescription drug(s)  [mod]  []  Kidney biopsy  [mod]  []  Central line placement  [mod] [x]  High risk medication use and/or intensive  toxicity monitoring [high]  []  Renal replacement therapy [high]  []  High risk kidney biopsy  [high]  []  Escalation of care  [high]  []  High risk central line placement  [high] Immunosuppression: high risk for infection Risk      _____________________________________________________________________________________      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 04/2016 and right 12/2016)  Post-Transplant Course: HD once for hyperkalemia early after transplant  Prior Transplants: none  Induction: alemtuzumab  Date of Ureteral Stent Removal: 07/29/2019  CMV and EBV Serologies: CMV D+/R+, EBV D+/R  Rejection Episodes: 01/2022 kidney biopsy showed mild focal tubulitis, patien twas treated with solumedrol 125 mg IV x3 and steroid taper  Donor Specific Antibodies: none    Interval events:   Discharging to AIR today. Reports making good urine.     INPATIENT MEDICATIONS:  Current Medications[1]    Physical Exam:   Vitals:    07/21/24 1615 07/21/24 2025 07/22/24 0331 07/22/24 0857   BP: 128/72 130/70 135/72 142/68   Pulse: 56 57 50 53   Resp: 15 16 18 18    Temp: 36.6 ??C (97.9 ??F) 36.4 ??C (97.5 ??F) 36.2 ??C (97.2 ??F) 36.4 ??C (97.5 ??F)   TempSrc: Oral Temporal Temporal Temporal   SpO2: 98% 97%  99%   Weight:       Height:         I/O this shift:  In: 240 [P.O.:240]  Out: 300 [Urine:300]    Intake/Output Summary (Last 24 hours) at 07/22/2024 1103  Last data filed at 07/22/2024 0901  Gross per 24 hour   Intake 240 ml   Output 550 ml   Net -310 ml         Constitutional: no acute distress, laying in bed   Heart: regular rate and rhythm  Lungs: normal WOB, on room air  Ext: no lower extremity edema    Neuro: awake, alert                   [1]   Current Facility-Administered Medications:     acetaminophen  (TYLENOL ) tablet 650 mg, Oral, Q6H PRN    [Provider Hold] amlodipine  (NORVASC ) tablet 10 mg, Oral, Daily    atorvastatin  (LIPITOR ) tablet 40 mg, Oral, Daily    [COMPLETED] dexAMETHasone  (DECADRON ) tablet 4 mg, Oral, Q6H SCH **FOLLOWED BY** [COMPLETED] dexAMETHasone  (DECADRON ) tablet 2 mg, Oral, Q6H SCH **FOLLOWED BY** [COMPLETED] dexAMETHasone  (DECADRON ) tablet 2 mg, Oral, Q8H SCH **FOLLOWED BY** dexAMETHasone  (DECADRON ) tablet 2 mg, Oral, Q12H SCH **FOLLOWED BY** [DISCONTINUED] dexAMETHasone  (DECADRON ) tablet 1 mg, Oral, Q12H SCH    dextrose  50 % in water  (D50W) 50 % solution 12.5 g, Intravenous, Q15 Min PRN    famotidine  (PEPCID ) tablet 20 mg, Oral, Daily    heparin  (porcine) 5,000 unit/mL injection 5,000 Units, Subcutaneous, Q8H SCH    hydrALAZINE  (APRESOLINE ) injection 10 mg, Intravenous, Q4H PRN    insulin  lispro (HumaLOG ) inj PERCENTAGE MEAL EATEN 4 Units, Subcutaneous, 3xd Meals    insulin  lispro (HumaLOG ) injection CORRECTIONAL 0-20 Units, Subcutaneous, ACHS    insulin  NPH (HumuLIN ,NovoLIN ) injection 5 Units, Subcutaneous, Q12H SCH    labetalol (NORMODYNE) injection, Intravenous, Q4H PRN    melatonin tablet 3 mg, Oral, Nightly PRN    senna (SENOKOT) tablet 2 tablet, Oral, Nightly    tacrolimus  (ENVARSUS  XR) extended release tablet 7 mg, Oral, Daily  valsartan  (DIOVAN ) tablet 160 mg, Oral, Daily

## 2024-07-22 NOTE — Progress Notes (Signed)
 [facility]   INPATIENT PROGRESS NOTE      Brief History of Present Illness  Frank Leach is a 73 y.o. male with RCC/renal transplant now found to have a left frontal brain mass now s/p left craniotomy for resection (12/5).    Subjective/Interval History  Approved for AIR. Stable to discharge today.     Interval Imaging Reviewed  None    Neurological Assessment and Plan  **Frontal mass of brain  POD11 Left craniotomy for resection of brain mass (12/5)  - SBP < 160; Na > 135  - dex taper to 2mg  BID - should be managed by oncology outpatient   - Keppra  500 BID x 7 days  - Continue tacrolimus  per nephro   - HOB > 30  - PT/OT  - Pending AIR  - Will also pursue DC home with home health  - Staples should stay in for full 14 days     Anticoagulant/Antiplatelet needs: ASA- hold    Disposition: floor status    ___________________________________________________________________    Neurological Exam  EOSp  Ox3  PERRL  EOMI  FS  TML  RUE 4  LUE 5  RLE 4++  LLE 5    Incision c/d/I staples     The patient's vitals, intake/output, labs, orders, and relevant imaging were reviewed for the last 24 hours.    Problem List  Principal Problem:    Frontal mass of brain

## 2024-07-22 NOTE — Telephone Encounter (Signed)
 Please schedule this patient for an appointment as noted below.  Please contact the patient as needed/appropriate to alert them of this appointment.    Provider: [facility]    Date or Time Frame: 2-3 weeks     Reason for Visit: Post-op craniotomy for GBM resection     Imaging Needed: no    Reason for Imaging: n/a

## 2024-07-22 NOTE — Consults (Signed)
 Facility Information: [facility]   Facility ID: [id number]  Facility Medicare Provider Number: [id number]        Physical Medicine and Rehabilitation  Rehab Pre-Admit Screening  Date: 07/22/2024   Time: 7:49 AM     Patient Information:    Patient Name:  Frank Leach Medical Record Number: [id number]   Address:  [address] [place] [address] Sex: Male   Date of Birth: Oct 20, 1950 Age: 73 y.o.   Room/Bed:  6413/6413-01    _____________________________________________________________________________    Attending Physician's Review and Admission Determination   Medical Necessity:  The patient requires acute inpatient rehabilitation to maximize functional independence and requires daily physician visits for monitoring/management of s/p brain mass resetion, ESRD now renal x plant, vital signs, anticoagulation, neurological exam, delirium, medications, skin, wounds, spasticity, and pain control. This patient's rehabilitation goals and medical complexity could not adequately be managed in a less intensive setting.  Potential risks for clinical complications include falls, adverse medication reactions, DVT/PE, pressure ulcers, wound dehiscence/infection, aspiration, urinary tract infection, dehydration/malnutrition, respiratory failure, worsening of underlying disease, bleeding, and seizures.    Medical Prognosis: Good for continued progress and participation with therapy.    Anticipated Interdisciplinary Rehabilitation Interventions: Activity tolerance: Patient is expected to tolerate minimum of 3 hrs of therapy daily @ 5x/week. Physical therapy to work on mobility. Occupational therapy to work on self care. Speech therapy to work on cognition, speech, and swallowing. Recreational therapy for community reintegration and relaxation training. Rehab Nursing to work on medication administration, patient/family education, skin care, wound care, fall prevention, bowel and bladder management, reorientation, vital signs, and feeding/nutrition. Weekly interdisciplinary team conference to assess progress and plan of care changes.    Expected Functional Outcomes:  Expected level of improvement/goals for inpatient rehabilitation include supervision or some assistance for transfers, supervision or some assistance for ambulation, and supervision, setup or some assistance for self care (ADLs)    Rehab Impairment Group Code [facility]): (Brain Dysfunction) 02.1 Non-Traumatic  Etiologic Diagnosis: Frank Leach is a 73 y.o. male with a past medical history of RCC s/p bilateral nephrectomy, DDKT, ESRD on HD  admitted to [facility] on  07/03/2024 for impaired mobility found to have a new brain mass, now s/p left craniotomy for resection (12/5).Frank Leach participated in acute inpatient physical and occupational therapies to improve functional mobility, activity tolerance, functional strength, balance, and endurance in order to facilitate safe performance of ADLs and daily routines. Frank Leach Leach been referred to [facility] for continued acute medical management, provision of intensive inpatient therapies, and patient/family training to facilitate safe performance of ADLs and mobility, prior to discharge home.    Frank Adjutant, MD 07/22/2024 7:57 AM            _____________________________________________________________________________    Advanced Directives:                        Coverage Information:  Authorization number:    Authorization Code:  Activation code not generated  Current [place] Chart Status: Active  Payor: [employer] ADV / Plan: [employer]ARE ADV / Product Type: *No Product type* /     Physician/Referral Information:  Referring Facility: [facility]     Referring Case Manager: Powell Remington      Patient / Family / Caregiver Orientation: Pt and family in agreement with [facility]    Prior Living Situation:  Living Environment: House    Lives  With: Spouse    Home Living: One level home; Tub/shower unit; Hand-held shower hose; Handicapped height toilet; Grab bars in shower; Grab bars around toilet; Stairs to enter with rails; Shower chair without back    Rail placement (outside): Bilateral rails in reach    Rail placement (inside): Rail on right side    Number of Stairs to Alternate level (inside): 2    Prior Level of Function:  Prior Function Comments: At baseline he is active and Independent with ADLs, IADLs, and functional mobility skills without assistive device. Active, enjoys yard work. No recent falls.     Rehabilitation Diagnosis:  Etiologic Diagnosis / Description: Frank Leach is a 73 y.o. male with a past medical history of RCC s/p bilateral nephrectomy, DDKT, ESRD on HD  admitted to [facility] on  07/03/2024 for impaired mobility found to have a new brain mass, now s/p left craniotomy for resection (12/5).Frank Leach Leach participated in acute inpatient physical and occupational therapies to improve functional mobility, activity tolerance, functional strength, balance, and endurance in order to facilitate safe performance of ADLs and daily routines. Frank Leach Leach been referred to [facility] for continued acute medical management, provision of intensive inpatient therapies, and patient/family training to facilitate safe performance of ADLs and mobility, prior to discharge home.      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 Medical management/administration of opioid analgesics, ,anticonvulsants, anticoagulants, antihypertensives, diabetic agents, corticosteroids, vasodilators, and other prescribed/necessary medications.  Monitoring/management of pain, cardiopulmonary status, renal status, gastrointestinal status, neurological status, infection, bleeding, bowel and bladder, DVT, and skin breakdown.      Risk for Medical / Clinical Complication: Patient at increased risk for complications of cardiopulmonary insufficiency, renal insufficiency, hypertensive crisis, gastrointestinal insufficiency, neurological decline, seizures, fluid volume overload, aspiration, bleeding, infection, uncontrolled pain, bowel/bladder retention, falls, and skin breakdown.      Special Rehabilitation Needs:      Hearing - Right Ear: Functional    Hearing - Left Ear: Functional       Cultural Requests During Hospitalization: Pt at increased risk of anxiety/depression related to recent medical status change        Requires modified schedule: Pt may require rest breaks between therapy sessions      Precautions: Falls precautions       Required Braces or Orthoses: Non-applicable    Past Medical History:  Past Medical History[1]  Past Surgical History:  Past Surgical History[2]  Family History:  Family History[3]  Social History:  Social History[4]  Current Medications and Prescriptions Ordered in Epic[5]  Prescriptions Prior to Admission[6]       Vitals:    07/21/24 1230 07/21/24 1615 07/21/24 2025 07/22/24 0331   BP: 123/69 128/72 130/70 135/72   Pulse: 52 56 57 50   Resp: 16 15 16 18    Temp: 36.5 ??C (97.7 ??F) 36.6 ??C (97.9 ??F) 36.4 ??C (97.5 ??F) 36.2 ??C (97.2 ??F)   TempSrc: Oral Oral Temporal Temporal   SpO2: 98% 98% 97%    Weight:       Height:           Vitals:    Height:  170.2 cm (5' 7.01), Weight: 73 kg (160 lb 15 oz)    Labs:      CBC -   Lab Results   Component Value Date    WBC 15.4 (H) 07/19/2024    RBC 4.09 (L) 07/19/2024    HGB 12.0 (L) 07/19/2024    HCT 36.9 (L) 07/19/2024    MCV 90.1 07/19/2024    MCH 29.2 07/19/2024    MCHC 32.4 07/19/2024    MPV 9.8 07/19/2024    PLT 251 07/19/2024     BMP -   Lab Results   Component Value Date    NA 142 07/19/2024    K 4.5 07/19/2024    CL 108 (H) 07/19/2024    CO2 21.0 07/19/2024    BUN 32 (H) 07/19/2024    CREATININE 1.64 (H) 07/19/2024    GFR 5.11 (L) 05/01/2012    GFRAAM 52 (L) 12/06/2020    GFRNAAM 45 (L) 12/06/2020    GLU 222 (H) 07/19/2024     CARD -   Lab Results   Component Value Date    CKTOTAL 247.0 (H) 04/12/2016    CKMB 2.83 04/12/2016    TROPONINI <0.034 04/12/2016     Coagulation -   Lab Results   Component Value Date    PT 12.0 01/19/2022    INR 1.06 01/19/2022    APTT 27.9 01/19/2022     ABGs-   Lab Results   Component Value Date    PHART 7.34 (L) 07/11/2024    PO2ART 157.0 (H) 07/11/2024    PCO2ART 33.4 (L) 07/11/2024    BEART -7.3 (L) 07/11/2024    HCO3ART 17 (L) 07/11/2024    O2SATART 99.4 07/11/2024     LFT's -   Lab Results   Component Value Date    ALBUMIN 2.9 (L) 07/19/2024    ALT 17 07/03/2024    AST 20 07/03/2024    ALKPHOS 78 07/03/2024    BILITOT 0.2 (L) 07/03/2024    BILIDIR 0.20 12/10/2023    PROT 7.2 07/03/2024     Was the influenza vaccine received in this facility during this year's vaccination season?  If yes date:  07/08/2024  If no comment:      Current Functional Status:     Feeding : Min assist    Grooming: Contact Guard assist; Verbal assist/cues required    Bathing: Min assist    Toileting: Mod assist    UB Dressing: Mod assist    LB Dressing: Mod assist    Mobility:   Bed Mobility comments: Standby assist (supine <> sit with raised HOB)    Transfers: ontact Guard assist, Physical assistance required, Verbal assist/cues required (pt requires one-step VCs paired with visual and tactile cues for hand placement and brining hips toward edge of sitting surface; STS EOB;  t/f in / out of arm chair in hallway; t/f sitting EOB)    Ambulation comments: Contact guard assist, steadying assist, Minimal assist, patient does 75% or more (EOB > bathroom > hallway ~52ft (seated rest break) > EOB; intermittent R sided LOB, requiring min A-mod A for righting; gross CGA-min A during ambulation; one-step cues for sequencing steps c RW)    Stairs: NT     Cognition/Swallow/Speech:  Patient's Vision Adequate to Safely Complete Daily Activities: Yes    Patient's Judgement Adequate to Safely Complete Daily Activities: Yes    Patient's Memory Adequate to Safely Complete Daily Activities: Yes    Patient Able to Express Needs/Desires: Yes    Patient Leach speech problem: No      DME Recommendations:  PT DME Recommendations: Defer to post acute    Willingness to Participate: Pt able and willing to participate in 3 hours of therapy.    Rehab Goals and Plan    Expected level of improvement for safe discharge: Patient will discharge home as independent as possible with ADLs and functional mobility with least restrictive assistive device for household distances.     Patient / Family Goals: Patient will safely return home with family, modified independence in ADLs and assist as needed with ambulation for household distances.    Required treatments and services: Rehab nursing, Case management, Dietician/nutrition     Anticipated Interventions:    Physical Therapy: 60-120 min/day 5-7 days/wk  Occupational Therapy: 60-120 min/day 5-7 days/wk    Recreational therapy: 30 min/day 3 days/wk  Prosthetics and Orthotics: As Needed      Anticipated services upon discharge:     Outpatient therapy: PT, OT    Expected discharge destination: Home with family    Discharge support: Patient Leach a caregiver available    Patient/family/caregiver orientation: Patient and family agreeable to inpatient rehab plan    Estimated Length of Stay: 12 days    Projected Admission Date: Tuesday July 22, 2024     Reviewer's Signature, Date and Time: Estefana JONELLE Daring, RN,  BSN  [facility]  Inpatient Coordinator  Office: [phone]    7:52 AM  07/22/2024          [1]   Past Medical History:  Diagnosis Date    Anemia     Cancer    (CMS-HCC)     clear cell adenocarcinoma of left kidney    Diabetes mellitus (CMS-HCC)     Diabetic nephropathy (CMS-HCC)     ESRD (end stage renal disease) on dialysis    (CMS-HCC)     ESRD on dialysis (CMS-HCC)     Gout     Hyperlipidemia     Hypertension    [2]   Past Surgical History:  Procedure Laterality Date    AV FISTULA PLACEMENT      PR COLONOSCOPY FLX DX W/COLLJ SPEC WHEN PFRMD N/A 04/30/2018    Procedure: COLONOSCOPY, FLEXIBLE, PROXIMAL TO SPLENIC FLEXURE; DIAGNOSTIC, W/WO COLLECTION SPECIMEN BY BRUSH OR WASH;  Surgeon: Dorn Lynwood Lauth, MD;  Location: [facility];  Service: Gastroenterology    PR COLONOSCOPY W/BIOPSY SINGLE/MULTIPLE N/A 04/30/2018    Procedure: COLONOSCOPY, FLEXIBLE, PROXIMAL TO SPLENIC FLEXURE; WITH BIOPSY, SINGLE OR MULTIPLE;  Surgeon: Dorn Lynwood Lauth, MD;  Location: [facility];  Service: Gastroenterology    PR COLSC FLX W/RMVL OF TUMOR POLYP LESION SNARE TQ N/A 04/30/2018    Procedure: COLONOSCOPY FLEX; W/REMOV TUMOR/LES BY SNARE;  Surgeon: Dorn Lynwood  Conny, MD;  Location: [facility];  Service: Gastroenterology    PR EXCIS SUPRATENT BRAIN TUMOR Left 07/11/2024    Procedure: Left craniotomy for resection of mass;  Surgeon: Zettie Rosaria HERO, MD;  Location: OR [facility];  Service: Neurosurgery    PR EXPLORATORY OF ABDOMEN Midline 04/14/2016    Procedure: EXPLORATORY LAPAROTOMY, EXPLORATORY CELIOTOMY WITH OR WITHOUT BIOPSY(S);  Surgeon: Marsa Sam Boss, MD; Location: MAIN OR [facility];  Service: Transplant    PR EXPLORATORY OF ABDOMEN N/A 04/16/2016    Procedure: EXPLORATORY LAPAROTOMY, EXPLORATORY CELIOTOMY WITH OR WITHOUT BIOPSY(S);  Surgeon: Marsa Sam Boss, MD;  Location: MAIN OR [facility];  Service: Transplant    PR IONM 1 ON 1 IN OR W/ATTENDANCE EACH 15 MINUTES N/A 07/11/2024    Procedure: CONTINUOUS INTRAOPERATIVE NEUROPHYSIOLOGY MONITORING IN OR;  Surgeon: Zettie Rosaria HERO, MD;  Location: OR [facility];  Service: Neurosurgery    PR LAP, RADICAL NEPHRECTOMY Right 12/27/2016    Procedure: Robotic Xi Laparoscopy; Radical Nephrectomy (Incl Remove Gerota`S Fascia, Fatty Tissue, Reg Lymph Node, Adrenalectomy);  Surgeon: Clorinda Ole Chalk, MD;  Location: MAIN OR [facility];  Service: Urology    PR LIGATN ANGIOACCESS AV FISTULA Left 11/29/2022    Procedure: LIGATION OR BANDING OF ANGIOACCESS ARTERIOVENOUS FISTULA;  Surgeon: [place], Marsa Sam, MD;  Location: MAIN OR [facility];  Service: Transplant    PR MICROSURG TECHNIQUES,REQ OPER MICROSCOPE N/A 07/11/2024    Procedure: MICROSURGICAL TECHNIQUES, REQUIRING USE OF OPERATING MICROSCOPE (LIST SEPARATELY IN ADDITION TO CODE FOR PRIMARY PROCEDURE);  Surgeon: Zettie Rosaria HERO, MD;  Location: OR [facility];  Service: Neurosurgery    PR NEGATIVE PRESSURE WOUND THERAPY DME >50 SQ CM N/A 04/12/2016    Procedure: NEG PRESS WOUND TX (VAC ASSIST) INCL TOPICALS, PER SESSION, TSA GREATER THAN/= 50 CM SQUARED;  Surgeon: Marsa Sam Boss, MD;  Location: MAIN OR [place];  Service: Transplant    PR NEGATIVE PRESSURE WOUND THERAPY DME >50 SQ CM Bilateral 04/14/2016    Procedure: NEG PRESS WOUND TX (VAC ASSIST) INCL TOPICALS, PER SESSION, TSA GREATER THAN/= 50 CM SQUARED;  Surgeon: Marsa Sam Boss, MD;  Location: MAIN OR [place];  Service: Transplant    PR NEGATIVE PRESSURE WOUND THERAPY DME >50 SQ CM N/A 04/16/2016    Procedure: NEG PRESS WOUND TX (VAC ASSIST) INCL TOPICALS, PER SESSION, TSA GREATER THAN/= 50 CM SQUARED;  Surgeon: Marsa Sam Boss, MD;  Location: MAIN OR [facility];  Service: Transplant    PR NEPHRECTOMY, W/PART. URETECTOMY Bilateral 04/11/2016    Procedure: LAPAROSCOPY, SURGICAL, NEPHRECTOMY WITH TOTAL URETERECTOMY;  Surgeon: Marsa Sam Boss, MD;  Location: MAIN OR [facility];  Service: Transplant    PR REMV KIDNEY,W/RIB RESECTION Bilateral 04/11/2016    Procedure: NEPHRECTOMY, INCLUDING PARTIAL URETERECTOMY, ANY OPEN APPROACH INCLUDING RIB RESECTION;  Surgeon: Marsa Sam Boss, MD;  Location: MAIN OR [facility];  Service: Transplant    PR STEREOTACTIC COMP ASSIST PROC,CRANIAL,INTRADURAL N/A 07/11/2024    Procedure: STEREOTACTIC COMPUTER-ASSISTED (NAVIGATIONAL) PROCEDURE; CRANIAL, INTRADURAL;  Surgeon: Zettie Rosaria HERO, MD;  Location: OR [facility];  Service: Neurosurgery    PR TRANSPLANT,PREP LIVING  RENAL GRAFT N/A 06/22/2019    Procedure: [facility] STD PREP LIVING DONOR RENAL ALLGRFT (OPEN/LAPROSC) PRIOR TO TRANSPLANT, INC DISSECT/REM AS NECESS;  Surgeon: Marsa Sam Boss, MD;  Location: MAIN OR [facility];  Service: Transplant    PR TRANSPLANTATION OF KIDNEY N/A 06/22/2019    Procedure: RENAL ALLOTRANSPLANTATION, IMPLANTATION OF GRAFT; WITHOUT RECIPIENT NEPHRECTOMY;  Surgeon: Marsa Sam Boss, MD;  Location: MAIN OR [facility];  Service: Transplant  SPLENECTOMY     [3]   Family History  Problem Relation Age of Onset    Kidney disease Mother     Cancer Father     Kidney disease Brother    [4]   Social History  Socioeconomic History    Marital status: Married     Spouse name: Alphonzo Devera    Number of children: 1    Years of education: None    Highest education level: High school graduate   Tobacco Use    Smoking status: Never    Smokeless tobacco: Never   Vaping Use    Vaping status: Never Used   Substance and Sexual Activity    Alcohol use: No    Drug use: No     Social Drivers of Health     Food Insecurity: No Food Insecurity (07/07/2024)    Hunger Vital Sign     Worried About Running Out of Food in the Last Year: Never true     Ran Out of Food in the Last Year: Never true   Tobacco Use: Low Risk (07/12/2024)    Patient History     Smoking Tobacco Use: Never     Smokeless Tobacco Use: Never   Transportation Needs: No Transportation Needs (07/07/2024)    PRAPARE - Transportation     Lack of Transportation (Medical): No     Lack of Transportation (Non-Medical): No   Housing: Low Risk (07/07/2024)    Housing     Within the past 12 months, have you ever stayed: outside, in a car, in a tent, in an overnight shelter, or temporarily in someone else's home (i.e. couch-surfing)?: No     Are you worried about losing your housing?: No   Utilities: Low Risk (07/07/2024)    Utilities     Within the past 12 months, have you been unable to get utilities (heat, electricity) when it was really needed?: No   Interpersonal Safety: Not At Risk (07/11/2024)    Interpersonal Safety     Unsafe Where You Currently Live: No     Physically Hurt by Anyone: No     Abused by Anyone: No   Financial Resource Strain: Low Risk (07/07/2024)    Overall Financial Resource Strain (CARDIA)     Difficulty of Paying Living Expenses: Not hard at all   [5]   Current Facility-Administered Medications Ordered in Epic   Medication Dose Route Frequency Provider Last Rate Last Admin    acetaminophen  (TYLENOL ) tablet 650 mg  650 mg Oral Q6H PRN Vicci Chiquita Sat, ACNP        [Provider Hold] amlodipine  (NORVASC ) tablet 10 mg  10 mg Oral Daily Vicci Chiquita Dimsdale, ACNP   10 mg at 07/11/24 1743    atorvastatin  (LIPITOR ) tablet 40 mg  40 mg Oral Daily Vicci Chiquita Dimsdale, ACNP   40 mg at 07/21/24 0859    dexAMETHasone  (DECADRON ) tablet 2 mg  2 mg Oral Q12H SCH Al-Adli, Nadeem N, MD   2 mg at 07/21/24 2103    dextrose  50 % in water  (D50W) 50 % solution 12.5 g  12.5 g Intravenous Q15 Min PRN Vicci Chiquita Dimsdale, ACNP        famotidine  (PEPCID ) tablet 20 mg  20 mg Oral Daily Quinn, Megan R, ACNP   20 mg at 07/21/24 0859    heparin  (porcine) 5,000 unit/mL injection 5,000 Units  5,000 Units Subcutaneous Q8H SCH Bonnetta Carleton Norris, ACNP   5,000 Units at 07/22/24 [id number]  hydrALAZINE  (APRESOLINE ) injection 10 mg  10 mg Intravenous Q4H PRN Bonnetta Carleton Norris, ACNP        insulin  lispro (HumaLOG ) inj PERCENTAGE MEAL EATEN 4 Units  4 Units Subcutaneous 3xd Meals [place], April Hendricks, FNP        insulin  lispro (HumaLOG ) injection CORRECTIONAL 0-20 Units  0-20 Units Subcutaneous ACHS Leotis, April Lynne, FNP   4 Units at 07/20/24 2132    insulin  NPH (HumuLIN ,NovoLIN ) injection 5 Units  5 Units Subcutaneous Q12H [facility], April Lynne, FNP        labetalol (NORMODYNE) injection  10 mg Intravenous Q4H PRN Bonnetta Carleton Norris, ACNP        melatonin tablet 3 mg  3 mg Oral Nightly PRN Rardin, Wanda, PA   3 mg at 07/13/24 0143    senna (SENOKOT) tablet 2 tablet  2 tablet Oral Nightly Kennyth Darryle SAUNDERS, ACNP   2 tablet at 07/18/24 2101    tacrolimus  (ENVARSUS  XR) extended release tablet 7 mg  7 mg Oral Daily Al-Adli, Nadeem N, MD   7 mg at 07/21/24 0859    valsartan  (DIOVAN ) tablet 160 mg  160 mg Oral Daily Bonnetta Carleton Norris, ACNP   160 mg at 07/21/24 9140     Current Outpatient Medications Ordered in Epic   Medication Sig Dispense Refill    ondansetron  (ZOFRAN ) 8 MG tablet Take 1 tablet (8 mg total) by mouth every eight (8) hours as needed for nausea. Take 1 tablet 1 hour before the chemotherapy (temozolomide ). 21 tablet 1    temozolomide  (TEMODAR ) 140 mg capsule Take 1 capsule (140 mg total) by mouth daily . 21 capsule 0   [6]   Medications Prior to Admission   Medication Sig Dispense Refill Last Dose/Taking    acetaminophen  (TYLENOL ) 500 MG tablet Take 1-2 tablets (500-1,000 mg total) by mouth every six (6) hours as needed for pain or fever (> 38C). 100 tablet 0 07/10/2024    allopurinol  (ZYLOPRIM ) 100 MG tablet Take 1 tablet (100 mg total) by mouth every evening. 90 tablet 3 07/10/2024    atorvastatin  (LIPITOR ) 40 MG tablet Take 1 tablet (40 mg total) by mouth daily. 90 tablet 3 07/10/2024    [EXPIRED] dexAMETHasone  (DECADRON ) 1 MG tablet Take 4 tablets (4 mg total) by mouth every twelve (12) hours for 3 days, THEN 4 tablets (4 mg total) daily for 3 days, THEN 2 tablets (2 mg total) daily for 3 days, THEN 1 tablet (1 mg total) daily for 3 days. 45 tablet 0 07/11/2024    ENVARSUS  XR 4 mg Tb24 extended release tablet Take 1 tablet (4 mg total) by mouth daily. Take in addition to ONE 1 mg tablets for a total daily dose of 5 mg. 90 tablet 3 07/10/2024    latanoprost (XALATAN) 0.005 % ophthalmic solution Administer 1 drop to both eyes nightly.   07/10/2024    magnesium  oxide-Mg AA chelate (MAGNESIUM , AMINO ACID CHELATE,) 133 mg Take 1 tablet by mouth two (2) times a day. 100 tablet 11 07/10/2024    metFORMIN  (GLUCOPHAGE -XR) 500 MG 24 hr tablet Take 1 tablet (500 mg total) by mouth in the morning. 90 tablet 3 07/10/2024    tacrolimus  (ENVARSUS  XR) 1 mg Tb24 extended release tablet Take 1 tablet (1 mg total) by mouth in the morning. Take one tablet daily with ONE 4 mg tablet for a total daily dose of 5mg .   07/10/2024    [Paused] amlodipine  (NORVASC ) 10 MG tablet Take 1 tablet (  10 mg total) by mouth daily. (Patient not taking: Reported on 07/11/2024) 90 tablet 3 Not Taking    [EXPIRED] flu vacc 2025-26 (65 yr up) (PF)(FLUAD )45 mcg(15mcgx3)/0.5 ml IM syringe Inject 0.5 mL into the muscle once for 1 dose. 0.5 mL 0     [Paused] mycophenolate  (MYFORTIC ) 180 MG EC tablet Take 2 tablets (360 mg total) by mouth two (2) times a day. (Patient not taking: Reported on 07/11/2024) 180 tablet 3 Not Taking    [Paused] olmesartan  (BENICAR ) 40 MG tablet Take 1 tablet (40 mg total) by mouth daily. (Patient not taking: Reported on 07/11/2024) 90 tablet 3 Not Taking    ondansetron  (ZOFRAN ) 8 MG tablet Take 1 tablet (8 mg total) by mouth every eight (8) hours as needed for nausea. Take 1 tablet 1 hour before the chemotherapy (temozolomide ). 21 tablet 1     predniSONE  (DELTASONE ) 5 MG tablet Take 1 tablet (5 mg total) by mouth daily. (Patient not taking: Reported on 07/11/2024) 90 tablet 3 Not Taking    temozolomide  (TEMODAR ) 140 mg capsule Take 1 capsule (140 mg total) by mouth daily . 21 capsule 0

## 2024-07-22 NOTE — H&P (Signed)
 Physical Medicine and Rehab  Post-admission Physician Evaluation (PAPE)    ASSESSMENT:     Frank Leach is a 73 y.o. male with PMH of renal transplant who presented with gait imbalance and right sided weakness, found to have frontoparietal mass status post resection. Pathology consistent with glioblastoma. He is now admitted to [facility] for comprehensive interdisciplinary rehabilitation.     Rehab Impairment Group Code [facility]):   (Brain Dysfunction) 02.1 Non-Traumatic  Etiology: RCC left frontal brain mass    PLAN:     This patient is admitted to the Physical Medicine and Rehabilitation - Inpatient - D service from 8am-5pm on weekdays for questions regarding this patient. After hours, weekends, and holidays please contact the 1st Call resident pager     REHAB:   - PT and OT to maximize functional status with mobility and ADLs as well as prevention of joint contracture.   - SLP for cognitive and swallow function.  - Neuropsych for higher level cognitive evaluation and coping.  - RT for community re-integration, education, and leisure support services.  - To be discussed in weekly Interdisciplinary Team Conference.    Glioblastoma   Procedure date: 07/11/24; Dr. Zettie  Presented with two weeks of gait imbalance and right sided weakness. Found to have a frontoparietal lesion now status post resection with neurosurgery. Completed post-operative Keppra  prophylaxis during acute admission. Pathology consistent with glioblastoma. Neuro-oncology and radiation oncology consulted during acute admission and plan for concurrent radiation and temozolomide  chemotherapy. Currently on a steroid taper.   - dexamethasone  2mg  Q12 hours; will discuss with radiation oncology for taper plan  - famotidine  20mg  daily   - Tylenol  650mg  Q6hrs PRN  [ ]  staple removal; POD 14 ~12/19  [ ]  neurosurgery follow up  [ ]  radiation oncology follow up; plan for RT to start 12/30 (outpatient)  [ ]  neuro-oncology follow up; plan to start temozolomide  (new start) at the time of radiation therapy; pending MAP approval for financial assistance    HTN  - HOLD amlodipine  10mg  daily   - atorvastatin  40mg  daily   - valsartan  160mg  daily     S/p DDKT  Baseline creatining 1.7-1.9; stable. Off mycophenolate  in the setting of malignancy as above. Continued on tacrolimus .  - tacrolimus  7mg  daily   - tacrolimus  trough; goal level 4-6 ng/mL  - after completion of dexamethasone  as above, start prednisone  5mg  daily   [ ]  nephrology consult     T2DM  - NPH 8 units Q12 hrs  - Lispro 6 units TID w/meals  - SSI    Daily Checklist  - Diet: regular  - DVT PPX: SQ heparin   - GI PPX: famotidine    - Access:   Patient Lines/Drains/Airways Status       Active Active Lines, Drains, & Airways       None                  - Follow up: PM&R Outpatient Follow up: Cancer:  and - Please place an Ambulatory referral by entering cancer rehab in Epic and select [facility] as the Department in the PM+R referral to schedule follow up with Dr. Vena Lolling.    Code status: Full Code    Care Coordination: As part of the admission process, I discussed medical management of this patient???s case with Wells Hoff, MD (neurosurgery resident).      DISPO: Admitted to Rehab floor. Patient will be discussed at next interdisciplinary team conference.  Estimated Length of Stay: 12 days    Anticipated Post-Rehab Destination / Needs: home with family     Medical Necessity: The patient requires acute inpatient rehabilitation to maximize functional independence and requires daily physician visits for monitoring/management of s/p brain mass resetion, ESRD now renal x plant, vital signs, anticoagulation, neurological exam, delirium, medications, skin, wounds, spasticity, and pain control. This patient's rehabilitation goals and medical complexity could not adequately be managed in a less intensive setting.  Potential risks for clinical complications include falls, adverse medication reactions, DVT/PE, pressure ulcers, wound dehiscence/infection, aspiration, urinary tract infection, dehydration/malnutrition, respiratory failure, worsening of underlying disease, bleeding, and seizures.     SUBJECTIVE:     Reason for Admission: Comprehensive interdisciplinary inpatient rehabilitation program.    History of Present Illness:     Etiologic Diagnosis / Description: Frank Leach is a 73 y.o. male with a past medical history of RCC s/p bilateral nephrectomy, DDKT, ESRD on HD  admitted to [facility] on  07/03/2024 for impaired mobility found to have a new brain mass, now s/p left craniotomy for resection (12/5).Frank Leach has participated in acute inpatient physical and occupational therapies to improve functional mobility, activity tolerance, functional strength, balance, and endurance in order to facilitate safe performance of ADLs and daily routines. Frank Leach has been referred to [facility] for continued acute medical management, provision of intensive inpatient therapies, and patient/family training to facilitate safe performance of ADLs and mobility, prior to discharge home.       Today, patient reports feeling well. He continues to have weakness of his right side with mild pain around his incision site. He is eating and drinking well. Notes some poor sleep due to interruptions and bed comfort. He is looking forward to rehab prior to starting his radiation treatment. He notes a history of cataracts and has been waiting for surgery, though is now going to put off until he completes his new cancre therapy. Denies chest pain, palpitations, SOB, cough. No N/V or abdominal pain. No issues with incontinence. BMs regular, no constipation or diarrhea. No difficulty urinating, leaking urine, dysuria. No issues with mood.    Pre-Morbid Functional Status: Pre-Admission Functional Status:    At baseline he is active and Independent with ADLs, IADLs, and functional mobility skills without assistive device. Active, enjoys yard work. No recent falls.     Activities of Daily Living:        Feeding : Min assist    Grooming: Contact Guard assist; Verbal assist/cues required    Bathing: Min assist    Toileting: Mod assist    UB Dressing: Mod assist    LB Dressing: Mod assist         Mobility:   Bed Mobility comments: Standby assist (supine <> sit with raised HOB)       Ambulation comments: Contact guard assist, steadying assist, Minimal assist, patient does 75% or more (EOB > bathroom > hallway ~63ft (seated rest break) > EOB; intermittent R sided LOB, requiring min A-mod A for righting; gross CGA-min A during ambulation; one-step cues for sequencing steps c RW)    Stairs: NT         Cognition/Swallow/Speech:  Patient's Vision Adequate to Safely Complete Daily Activities: Yes    Patient's Judgement Adequate to Safely Complete Daily Activities: Yes    Patient's Memory Adequate to Safely Complete Daily Activities: Yes    Patient Able to Express Needs/Desires: Yes    Patient has speech problem: No  DME Recommendations:  PT DME Recommendations: Defer to post acute         Willingness to Participate: Pt able and willing to participate in 3 hours of therapy.      Requires modified schedule: Pt may require rest breaks between therapy sessions      Assistive Devices:         Precautions:  Falls    Medical / Surgical History: Reviewed  Past Medical History[1]  Past Surgical History[2]  Social History: Reviewed  Short Social History[3]       Family History: Pertinent as stated and otherwise reviewed and non-contributory   family history includes Cancer in his father; Kidney disease in his brother and mother.    Allergies: Reviewed  Patient has no known allergies.    Medications at Discharge from Acute Hospital: Reviewed     Your Medication List         Notice    Cannot display discharge medications because the patient has not yet been admitted.       Review of Systems:    Full 10 systems reviewed and negative, other than as noted in the HPI.    OBJECTIVE:     Vitals:  Temp:  [36.2 ??C (97.2 ??F)-36.6 ??C (97.9 ??F)] 36.4 ??C (97.5 ??F)  Pulse:  [50-57] 53  Resp:  [15-18] 18  BP: (123-142)/(68-72) 142/68  MAP (mmHg):  [87-90] 90  SpO2:  [97 %-99 %] 99 %      Physical Exam:  GEN: sitting up in bed, NAD, wife at bedside  HEENT: Post surgical appearance. Superior scalp incision clean, dry, intact with staples in place. Normocephalic. Moist mucous membranes. Trachea midline.  RESP: NWOB on RA. Lungs CTAB.  CV: Regular rate and rhythm. Warm and well-perfused.  GI: abd soft, NTND  GU: deferred  PSYCH: Appropriate and Calm  SKIN: see wound care flow sheet  MSK: No pain, redness or swelling on the joints  NEURO:  Mental Status: A&Ox 3 attention intact, speech fluid and coherent, follows commands well and answers questions appropriately, repeats no ifs and or buts  Cranial Nerve: Pupils equal. Extra ocular movements intact. Facial sensation intact bilaterally. No facial droop. Hearing grossly intact bilaterally. Tongue protrudes midline. Diminished shoulder shrug on the right.   Cerebellar: abnormal finger-to-nose due to weakness right and abnormal rapid alternating movements due to weakness on the right  Sensory: Sensation is intact to light touch in bilateral upper and lower extremities  REFLEXES:Not Tested   Motor:   - RUE 4/5 shoulder abd, 4+/5 EF, 4+/5 EE, 4/5 WE, 4/5 HG  - LUE 5/5 shoulder abd, 5/5 EF, 5/5 EE, 5/5 WE, 5/5 HG  - RLE 4/5 HF, 4/5 KE, 4+/5 DF, 4+/5 EHL, 4+/5 PF  - LLE 5/5 HF, 5/5 KE, 5/5 DF, 5/5 EHL, 5/5 PF    Spasticity:    Labs and Diagnostic Studies:    Personally reviewed   CBC - Results in Past 30 Days  Result Component Current Result Ref Range Previous Result Ref Range   HCT 36.9 (L) (07/19/2024) 39.0 - 48.0 % 39.0 (07/17/2024) 39.0 - 48.0 %   HGB 12.0 (L) (07/19/2024) 12.9 - 16.5 g/dL 87.3 (L) (87/88/7974) 87.0 - 16.5 g/dL   MCH 70.7 (87/86/7974) 25.9 - 32.4 pg 29.1 (07/17/2024) 25.9 - 32.4 pg   MCHC 32.4 (07/19/2024) 32.0 - 36.0 g/dL 67.6 (87/88/7974) 67.9 - 36.0 g/dL   MCV 09.8 (87/86/7974) 77.6 - 95.7 fL 90.0 (07/17/2024) 77.6 - 95.7 fL  MPV 9.8 (07/19/2024) 6.8 - 10.7 fL 9.4 (07/17/2024) 6.8 - 10.7 fL   Platelet 251 (07/19/2024) 150 - 450 10*9/L 261 (07/17/2024) 150 - 450 10*9/L   RBC 4.09 (L) (07/19/2024) 4.26 - 5.60 10*12/L 4.33 (07/17/2024) 4.26 - 5.60 10*12/L   WBC 15.4 (H) (07/19/2024) 3.6 - 11.2 10*9/L 20.3 (H) (07/17/2024) 3.6 - 11.2 10*9/L     BMP - Results in Past 30 Days  Result Component Current Result Ref Range Previous Result Ref Range   BUN 32 (H) (07/19/2024) 9 - 23 mg/dL 36 (H) (87/88/7974) 9 - 23 mg/dL   Chloride 891 (H) (87/86/7974) 98 - 107 mmol/L 109 (H) (07/17/2024) 98 - 107 mmol/L   CO2 21.0 (07/19/2024) 20.0 - 31.0 mmol/L 26.0 (07/17/2024) 20.0 - 31.0 mmol/L   Creatinine 1.64 (H) (07/19/2024) 0.73 - 1.18 mg/dL 8.27 (H) (87/88/7974) 9.26 - 1.18 mg/dL   Glucose 777 (H) (87/86/7974) 70 - 179 mg/dL 833 (87/88/7974) 70 - 820 mg/dL   Potassium 4.5 (87/86/7974) 3.4 - 4.8 mmol/L 4.7 (07/17/2024) 3.4 - 4.8 mmol/L   Sodium 142 (07/19/2024) 135 - 145 mmol/L 147 (H) (07/17/2024) 135 - 145 mmol/L     LFT's - Results in Past 30 Days  Result Component Current Result Ref Range Previous Result Ref Range   Albumin 2.9 (L) (07/19/2024) 3.4 - 5.0 g/dL 3.1 (L) (87/88/7974) 3.4 - 5.0 g/dL   Alkaline Phosphatase 78 (07/03/2024) 46 - 116 U/L Not in Time Range    ALT 17 (07/03/2024) 10 - 49 U/L Not in Time Range    AST 20 (07/03/2024) <=34 U/L Not in Time Range    Total Bilirubin 0.2 (L) (07/03/2024) 0.3 - 1.2 mg/dL Not in Time Range        Labs and Diagnostic Studies: Reviewed    Radiology Results: Reviewed           [1]   Past Medical History:  Diagnosis Date    Anemia     Cancer    (CMS-HCC)     clear cell adenocarcinoma of left kidney    Diabetes mellitus (CMS-HCC)     Diabetic nephropathy (CMS-HCC)     ESRD (end stage renal disease) on dialysis    (CMS-HCC)     ESRD on dialysis (CMS-HCC)     Gout     Hyperlipidemia     Hypertension [2]   Past Surgical History:  Procedure Laterality Date    AV FISTULA PLACEMENT      PR COLONOSCOPY FLX DX W/COLLJ SPEC WHEN PFRMD N/A 04/30/2018    Procedure: COLONOSCOPY, FLEXIBLE, PROXIMAL TO SPLENIC FLEXURE; DIAGNOSTIC, W/WO COLLECTION SPECIMEN BY BRUSH OR WASH;  Surgeon: Dorn Lynwood Lauth, MD;  Location: [facility] PROCEDURES [facility];  Service: Gastroenterology    PR COLONOSCOPY W/BIOPSY SINGLE/MULTIPLE N/A 04/30/2018    Procedure: COLONOSCOPY, FLEXIBLE, PROXIMAL TO SPLENIC FLEXURE; WITH BIOPSY, SINGLE OR MULTIPLE;  Surgeon: Dorn Lynwood Lauth, MD;  Location: [facility] PROCEDURES [facility];  Service: Gastroenterology    PR COLSC FLX W/RMVL OF TUMOR POLYP LESION SNARE TQ N/A 04/30/2018    Procedure: COLONOSCOPY FLEX; W/REMOV TUMOR/LES BY SNARE;  Surgeon: Dorn Lynwood Lauth, MD;  Location: [facility] PROCEDURES [facility];  Service: Gastroenterology    PR EXCIS SUPRATENT BRAIN TUMOR Left 07/11/2024    Procedure: Left craniotomy for resection of mass;  Surgeon: Zettie Rosaria HERO, MD;  Location: OR [facility];  Service: Neurosurgery    PR EXPLORATORY OF ABDOMEN Midline 04/14/2016    Procedure: EXPLORATORY LAPAROTOMY, EXPLORATORY CELIOTOMY WITH OR  WITHOUT BIOPSY(S);  Surgeon: Marsa Sam Boss, MD;  Location: MAIN OR [facility];  Service: Transplant    PR EXPLORATORY OF ABDOMEN N/A 04/16/2016    Procedure: EXPLORATORY LAPAROTOMY, EXPLORATORY CELIOTOMY WITH OR WITHOUT BIOPSY(S);  Surgeon: Marsa Sam Boss, MD;  Location: MAIN OR [facility];  Service: Transplant    PR IONM 1 ON 1 IN OR W/ATTENDANCE EACH 15 MINUTES N/A 07/11/2024    Procedure: CONTINUOUS INTRAOPERATIVE NEUROPHYSIOLOGY MONITORING IN OR;  Surgeon: Zettie Rosaria HERO, MD;  Location: OR [facility];  Service: Neurosurgery    PR LAP, RADICAL NEPHRECTOMY Right 12/27/2016    Procedure: Robotic Xi Laparoscopy; Radical Nephrectomy (Incl Remove Gerota`S Fascia, Fatty Tissue, Reg Lymph Node, Adrenalectomy);  Surgeon: Clorinda Ole Chalk, MD;  Location: MAIN OR [facility]; Service: Urology    PR LIGATN ANGIOACCESS AV FISTULA Left 11/29/2022    Procedure: LIGATION OR BANDING OF ANGIOACCESS ARTERIOVENOUS FISTULA;  Surgeon: [place], Marsa Sam, MD;  Location: MAIN OR [facility];  Service: Transplant    PR MICROSURG TECHNIQUES,REQ OPER MICROSCOPE N/A 07/11/2024    Procedure: MICROSURGICAL TECHNIQUES, REQUIRING USE OF OPERATING MICROSCOPE (LIST SEPARATELY IN ADDITION TO CODE FOR PRIMARY PROCEDURE);  Surgeon: Zettie Rosaria HERO, MD;  Location: OR [facility];  Service: Neurosurgery    PR NEGATIVE PRESSURE WOUND THERAPY DME >50 SQ CM N/A 04/12/2016    Procedure: NEG PRESS WOUND TX (VAC ASSIST) INCL TOPICALS, PER SESSION, TSA GREATER THAN/= 50 CM SQUARED;  Surgeon: Marsa Sam Boss, MD;  Location: MAIN OR [place];  Service: Transplant    PR NEGATIVE PRESSURE WOUND THERAPY DME >50 SQ CM Bilateral 04/14/2016    Procedure: NEG PRESS WOUND TX (VAC ASSIST) INCL TOPICALS, PER SESSION, TSA GREATER THAN/= 50 CM SQUARED;  Surgeon: Marsa Sam Boss, MD;  Location: MAIN OR [place];  Service: Transplant    PR NEGATIVE PRESSURE WOUND THERAPY DME >50 SQ CM N/A 04/16/2016    Procedure: NEG PRESS WOUND TX (VAC ASSIST) INCL TOPICALS, PER SESSION, TSA GREATER THAN/= 50 CM SQUARED;  Surgeon: Marsa Sam Boss, MD;  Location: MAIN OR [facility];  Service: Transplant    PR NEPHRECTOMY, W/PART. URETECTOMY Bilateral 04/11/2016    Procedure: LAPAROSCOPY, SURGICAL, NEPHRECTOMY WITH TOTAL URETERECTOMY;  Surgeon: Marsa Sam Boss, MD;  Location: MAIN OR [facility];  Service: Transplant    PR REMV KIDNEY,W/RIB RESECTION Bilateral 04/11/2016    Procedure: NEPHRECTOMY, INCLUDING PARTIAL URETERECTOMY, ANY OPEN APPROACH INCLUDING RIB RESECTION;  Surgeon: Marsa Sam Boss, MD;  Location: MAIN OR [facility];  Service: Transplant    PR STEREOTACTIC COMP ASSIST PROC,CRANIAL,INTRADURAL N/A 07/11/2024    Procedure: STEREOTACTIC COMPUTER-ASSISTED (NAVIGATIONAL) PROCEDURE; CRANIAL, INTRADURAL;  Surgeon: Zettie Rosaria HERO, MD; Location: OR [facility];  Service: Neurosurgery    PR TRANSPLANT,PREP LIVING  RENAL GRAFT N/A 06/22/2019    Procedure: [facility] STD PREP LIVING DONOR RENAL ALLGRFT (OPEN/LAPROSC) PRIOR TO TRANSPLANT, INC DISSECT/REM AS NECESS;  Surgeon: Marsa Sam Boss, MD;  Location: MAIN OR [facility];  Service: Transplant    PR TRANSPLANTATION OF KIDNEY N/A 06/22/2019    Procedure: RENAL ALLOTRANSPLANTATION, IMPLANTATION OF GRAFT; WITHOUT RECIPIENT NEPHRECTOMY;  Surgeon: Marsa Sam Boss, MD;  Location: MAIN OR [facility];  Service: Transplant    SPLENECTOMY     [3]   Social History  Tobacco Use    Smoking status: Never    Smokeless tobacco: Never   Vaping Use    Vaping status: Never Used   Substance Use Topics    Alcohol use: No    Drug use: No

## 2024-07-22 NOTE — Plan of Care (Signed)
 Patient arrived to [facility].       Problem: Rehabilitation ([facility]) Plan of Care  Goal: Absence of New-Onset Illness or Injury  Intervention: Prevent Fall and Fall Injury  Recent Flowsheet Documentation  Taken 07/22/2024 1800 by Thera Duwaine LABOR, RN  Safety Interventions: fall reduction program maintained  Taken 07/22/2024 1600 by Thera Duwaine LABOR, RN  Safety Interventions: fall reduction program maintained  Taken 07/22/2024 1400 by Thera Duwaine LABOR, RN  Safety Interventions: fall reduction program maintained  Taken 07/22/2024 1200 by Thera Duwaine LABOR, RN  Safety Interventions: fall reduction program maintained

## 2024-07-22 NOTE — Discharge Summary (Cosign Needed)
 NEUROSURGERY DISCHARGE SUMMARY    Identifying Information:   Frank Leach  [birth year]  [id number]    Admit date: 07/11/2024    Discharge date: 07/22/2024     Discharge Service: Neurosurgery [facility])    Discharge Attending Physician: Rosaria CHRISTELLA Lush, MD    Discharge to: [facility]    Discharge Diagnoses:  Principal Problem:    Frontal mass of brain  Active Problems:    Kidney replaced by transplant [facility])      Hospital Course:   Frank Leach is a 73 y.o. male with a history of RCC/renal transplant found to have a left frontal brain mass. They were seen and consented for surgical intervention after discussing the risks, benefits, and alternatives in full detail.    The patient was taken to the OR on 07/11/2024 for a left craniotomy for resection of mass.  He tolerated the procedure well, was extubated in the OR, and was taken to the ICU for further neuromonitoring and was then eventually transferred to the floor. He did well postoperatively. His diet was slowly advanced and at the time of discharge he was tolerating a regular diet. The patient was able to void spontaneously, have his pain controlled with P.O. pain medication, and returned to the preoperative ambulatory status. He was evaluated by Physical and Occupational Therapy and found to have 5x high weekly needs at home.  He will be discharged to rehab on POD 11 in stable condition.     ** CT sim was completed while inpatient. Per radiation oncology, patient will begin RT at [facility] after rehab is completed. He will also receive temozolomide  per neuro oncology.     Follow-up:  Staples will be removed at [facility] on POD14. We will schedule clinical follow-up once patient is discharged from rehab, ~ 3-4 weeks.     Procedures:   07/11/2024 for a left craniotomy for resection of mass    Pathology:   Glioblastoma, IDH-wildtype, CNS WHO grade 4     Discharge Day Services:   The patient was seen and evaluated by the Neurosurgery team on the day of discharge and deemed stable for discharge, including stable labs, vital signs, radiographic studies, and neurologic exam.  Discharge instructions were given and explained.  Questions were answered.    Physical Exam:  General: No acute distress  Cardiovascular: Hemodynamically stable  Pulmonary: Breathing is unlabored  Neurologic:   EOSp  Ox3  PERRL  EOMI  FS  TML  RUE 4  LUE 5  RLE 4++  LLE 5     Incision c/d/I staples     _____________________________________________________________________________    Temp:  [36.2 ??C (97.2 ??F)-36.6 ??C (97.9 ??F)] 36.4 ??C (97.5 ??F)  Pulse:  [50-57] 53  Resp:  [15-18] 18  BP: (123-142)/(68-72) 142/68  MAP (mmHg):  [87-90] 90  SpO2:  [97 %-99 %] 99 %      Condition at Discharge:   Stable  _____________________________________________________________________________  Discharge Medications:     Your Medication List        PAUSE taking these medications      amlodipine  10 MG tablet  Wait to take this until your doctor or other care provider tells you to start again.  Commonly known as: NORVASC   Take 1 tablet (10 mg total) by mouth daily.     olmesartan  40 MG tablet  Wait to take this until your doctor or other care provider tells you to start again.  Commonly known as: BENICAR   Take 1  tablet (40 mg total) by mouth daily.     predniSONE  5 MG tablet  Wait to take this until your doctor or other care provider tells you to start again.  Resume prednisone  after dexamethasone  course is completed  Commonly known as: DELTASONE   Take 1 tablet (5 mg total) by mouth daily.     temozolomide  140 mg capsule  Wait to take this until your doctor or other care provider tells you to start again.  Commonly known as: TEMODAR   Take 1 capsule (140 mg total) by mouth daily .            STOP taking these medications      flu vac 2025 65up-adjMF59C(PF) 45 mcg (15 mcg x 3)/0.5 mL IM syringe  Commonly known as: FLUAD      mycophenolate  180 MG EC tablet  Commonly known as: MYFORTIC             START taking these medications      famotidine  20 MG tablet  Commonly known as: PEPCID   Take 1 tablet (20 mg total) by mouth daily.     heparin  (porcine) 5,000 unit/mL injection  Inject 1 mL (5,000 Units total) under the skin every eight (8) hours.     ondansetron  8 MG tablet  Commonly known as: ZOFRAN   Take 1 tablet (8 mg total) by mouth every eight (8) hours as needed for nausea. Take 1 tablet 1 hour before the chemotherapy (temozolomide ).     senna 8.6 mg tablet  Commonly known as: SENOKOT  Take 2 tablets by mouth nightly.            CHANGE how you take these medications      dexAMETHasone  2 MG tablet  Commonly known as: DECADRON   Take 1 tablet (2 mg total) by mouth every twelve (12) hours.  What changed:   medication strength  See the new instructions.     ENVARSUS  XR 1 mg Tb24 extended release tablet  Generic drug: tacrolimus   Take 7 tablets (7 mg total) by mouth in the morning. Take one tablet daily with ONE 4 mg tablet for a total daily dose of 5mg .  What changed:   how much to take  Another medication with the same name was removed. Continue taking this medication, and follow the directions you see here.            CONTINUE taking these medications      acetaminophen  500 MG tablet  Commonly known as: TYLENOL   Take 1-2 tablets (500-1,000 mg total) by mouth every six (6) hours as needed for pain or fever (> 38C).     allopurinol  100 MG tablet  Commonly known as: ZYLOPRIM   Take 1 tablet (100 mg total) by mouth every evening.     atorvastatin  40 MG tablet  Commonly known as: LIPITOR   Take 1 tablet (40 mg total) by mouth daily.     latanoprost 0.005 % ophthalmic solution  Commonly known as: XALATAN  Administer 1 drop to both eyes nightly.     magnesium  (amino acid chelate) 133 mg tablet  Generic drug: magnesium  oxide-Mg AA chelate  Take 1 tablet by mouth two (2) times a day.     metFORMIN  500 MG 24 hr tablet  Commonly known as: GLUCOPHAGE -XR  Take 1 tablet (500 mg total) by mouth in the morning. _____________________________________________________________________________  Pending Test Results (if blank, then none):  Pending Labs       Order Current Status    Glioma Mutation Panel (  IDH1/2 and TERT promoter) In process    Tacrolimus  Level, Trough In process            Most Recent Labs:  Microbiology Results (last day)       ** No results found for the last 24 hours. **            Lab Results   Component Value Date    WBC 15.4 (H) 07/19/2024    HGB 12.0 (L) 07/19/2024    HCT 36.9 (L) 07/19/2024    PLT 251 07/19/2024       Lab Results   Component Value Date    NA 142 07/19/2024    K 4.5 07/19/2024    CL 108 (H) 07/19/2024    CO2 21.0 07/19/2024    BUN 32 (H) 07/19/2024    CREATININE 1.64 (H) 07/19/2024    CALCIUM 9.5 07/19/2024    MG 2.0 07/14/2024    PHOS 2.9 07/19/2024       _____________________________________________________________________________  Discharge Instructions:   Activity Instructions       Discharge activity      Activity:   Your muscles may be stiff or sore. Walking and moving around will help with both pain and stiffness. You will likely experience more fatigue for several days after surgery due to the medication used to put you to sleep. Try to remain active and awake during the day so that you are tired at bedtime and can get a full night???s rest. Avoid strenuous exercise for the first 2 weeks. Walking is ok. Do not do any bending, twisting, lifting over 10 lbs until cleared by the Neurosurgeon. Avoid any activity that puts you at risk for blows to the head.            Diet Instructions       Discharge Nutrition Therapy      Discharge Nutrition Therapy: Regular    Please continue a regular diet with no restrictions.                Other Instructions       Discharge call MD      Things to look out for:  -Pain- please call the Neurosurgery resident on-call for any worsening pain unrelieved with prescribed medications.  -Fever- If you have a fever over 101.5 F, call the Neurosurgery resident on-call. A low grade fever is normal for some people after surgery.  -Redness, drainage, odor, bleeding- If your incision is red, draining, has a bad odor, or has increasing bleeding, contact the Neurosurgery office during business hours or the Neurosurgery resident on-call after hours. There may be some normal swelling and pink-coloring around the wound. There may also be some fluid under the skin. It can take several weeks to months to be reabsorbed.  -Muscle strength- If an arm or leg becomes weak, call the Neurosurgery resident on     -Call immediately for the following:  -Bowel and Bladder- If you have new loss of bowel or bladder control, report straight to the emergency room.  -Mental status- If you or your family notice a change in your mental state from your normal, go straight to the emergency room. A change in mental status includes: increased confusion, trouble waking up, blurry/double vision, severe uncontrolled headaches, vomiting, loss of control of one side of your body, seizures, or other concerning issues.    Important Numbers:  - For Medical Emergencies: call 911 or go to the nearest hospital   - Neurosurgery resident  on-call 24/7: Call [phone] for the [facility] operator. Ask to speak with the Neurosurgery resident on-call.  - Spine Center: [phone] M-F 8am-5pm  - For appointments or to speak to a nurse: call the [facility] Neurosurgery Clinic (M-F 8am -4:30pm) at [phone]   - Dr. Kelsie patients may also contact his nurse coordinator, Alm Lee, with any questions or concerns at [phone]    Discharge dressing      Wound care:   Examine the surgical site at least twice daily. Please keep wound open to air. Do not use ointments, creams, gels, peroxide, or blow dryers on the wound. You may shower on the 3rd day after surgery. Please shower daily starting on day 3 and use baby shampoo for head incisions to clean the wound. Do not scrub too vigorously. Do not submerge the wound under water  (swimming or tub soaking) until 4-6 weeks after surgery. After showering, pat the wound dry and leave open to air. You have an appointment in approximately 2 weeks to have the stitches/staples removed or if you have absorbable sutures, you will have your incision checked. If you have dermabond, it will fall off on its own. If you did not receive an appointment while in the hospital, the clinic will contact you will an appointment.                Appointments which have been scheduled for you      Jul 23, 2024 10:00 AM  SPEECH THERAPY EVALUATION with Leita FORBES Hamilton, SLP  [facility] AND LANGUAGE PATHOLOGY [facility] REGION) [address]  [place] [address]        Aug 05, 2024 12:45 PM  (Arrive by 12:30 PM)  TREATMENT with Franky Marsa Sprinkles, MD  [facility] RADIATION ONCOLOGY [facility] REGION) [address]  1st Floor  [place] [address]  [phone]        Aug 06, 2024 7:30 AM  (Arrive by 7:15 AM)  LAB ONLY with [place] 1ST FLOOR PHLEB  LAB [place]) [address]  [facility] 1 through 4  [place] [address]        Aug 06, 2024 8:00 AM  (Arrive by 7:45 AM)  RETURN NEPHROLOGY POST with [name], DO  [facility] KIDNEY TRANSPLANT [place] [facility] REGION) [address]  [facility] 1 through 4  [place] [address]  [phone]        Aug 06, 2024 10:15 AM  (Arrive by 10:00 AM)  TREATMENT with RADONC HBRO INF1  [facility] RADIATION ONCOLOGY [place] [facility] REGION) [address]  1st Floor  [place] [address]  [phone]        Aug 06, 2024 10:30 AM  (Arrive by 10:15 AM)  OFFICE VISIT with Franky Marsa Sprinkles, MD  [facility] RADIATION ONCOLOGY [facility] REGION) [address]  1st Floor  [place] [address]  [phone]        Aug 08, 2024 10:15 AM  (Arrive by 10:00 AM)  TREATMENT with RADONC HBRO INF1  [facility] RADIATION ONCOLOGY [facility] REGION) [address]  1st Floor  [place] [address]  [phone]        Aug 11, 2024 10:15 AM  (Arrive by 10:00 AM)  TREATMENT with RADONC HBRO INF1  [place] RADIATION ONCOLOGY [place]) [address]  1st Floor  [place]  [address]  [phone]        Aug 12, 2024 10:15 AM  (Arrive by 10:00 AM)  TREATMENT with RADONC HBRO INF1  [facility] RADIATION ONCOLOGY [facility] REGION) [address]  1st Floor  [place] [address]  [phone]        Aug 13, 2024 10:15 AM  (Arrive by 10:00 AM)  TREATMENT with RADONC HBRO INF1  [facility] RADIATION ONCOLOGY [facility] REGION) [address]  1st Floor  [place] [address]  [phone]        Aug 13, 2024 10:30 AM  (Arrive by 10:15 AM)  OFFICE VISIT with Franky Marsa Sprinkles, MD  [facility] RADIATION ONCOLOGY [facility] REGION) [address]  1st Floor  [place] [address]  [phone]        Aug 14, 2024 10:15 AM  (Arrive by 10:00 AM)  TREATMENT with RADONC HBRO INF1  [facility] RADIATION ONCOLOGY [facility] REGION) [address]  1st Floor  [place] [address]  [phone]        Aug 15, 2024 10:15 AM  (Arrive by 10:00 AM)  TREATMENT with RADONC HBRO INF1  [facility] RADIATION ONCOLOGY  [facility] REGION) [address]  1st Floor  [place] [address]  [phone]        Aug 18, 2024 10:15 AM  (Arrive by 10:00 AM)  TREATMENT with RADONC HBRO INF1  [facility] RADIATION ONCOLOGY [facility] REGION) [address]  1st Floor  [place] [address]  [phone]        Aug 19, 2024 10:15 AM  (Arrive by 10:00 AM)  TREATMENT with RADONC HBRO INF1  [facility] RADIATION ONCOLOGY [facility] REGION) [address]  1st Floor  [place] [address]  [phone]        Aug 20, 2024 10:15 AM  (Arrive by 10:00 AM)  TREATMENT with RADONC HBRO INF1  [facility] RADIATION ONCOLOGY [facility] REGION) [address]  1st Floor  [place] [address]  [phone]        Aug 20, 2024 10:30 AM  (Arrive by 10:15 AM)  OFFICE VISIT with Franky Marsa Sprinkles, MD  [facility] RADIATION ONCOLOGY [facility] REGION) [address]  1st Floor  [place] [address]  [phone]        Aug 21, 2024 10:15 AM  (Arrive by 10:00 AM)  TREATMENT with RADONC HBRO INF1  [facility] RADIATION ONCOLOGY [facility] REGION) [address]  1st Floor  [place] [address]  [phone]        Aug 22, 2024 10:15 AM  (Arrive by 10:00 AM)  TREATMENT with RADONC HBRO INF1  [facility] RADIATION ONCOLOGY [facility] REGION) [address]  1st Floor  [place] [address]  [phone]        Aug 26, 2024 10:15 AM  (Arrive by 10:00 AM)  TREATMENT with RADONC HBRO INF1  [facility] RADIATION ONCOLOGY [facility] REGION) [address]  1st Floor  [place] [address]  [phone]        Aug 27, 2024 10:15 AM  (Arrive by 10:00 AM)  TREATMENT with RADONC HBRO INF1  [facility] RADIATION ONCOLOGY [facility] REGION) [address]  1st Floor  [place] [address]  [phone]        Sep 09, 2024 9:00 AM  NEW CATARACT with Emery Peri, MD  [facility]  OPHTHALMOLOGY [address] [place] [facility] REGION) [address]  [address]  [place] [address]  [phone]   Be advised the duration of your appointment with may range 1-2.5 hours.                 I spent 65 minutes in activities related to discharge for this patient

## 2024-07-23 LAB — TACROLIMUS LEVEL, TROUGH: TACROLIMUS, TROUGH: 3.3 ng/mL — ABNORMAL LOW (ref 5.0–15.0)

## 2024-07-23 MED ADMIN — insulin NPH (HumuLIN,NovoLIN) injection 5 Units: 5 [IU] | SUBCUTANEOUS | @ 13:00:00

## 2024-07-23 MED ADMIN — insulin NPH (HumuLIN,NovoLIN) injection 5 Units: 5 [IU] | SUBCUTANEOUS | @ 03:00:00

## 2024-07-23 MED ADMIN — melatonin tablet 3 mg: 3 mg | ORAL | @ 05:00:00

## 2024-07-23 MED ADMIN — heparin (porcine) 5,000 unit/mL injection 5,000 Units: 5000 [IU] | SUBCUTANEOUS | @ 10:00:00

## 2024-07-23 MED ADMIN — heparin (porcine) 5,000 unit/mL injection 5,000 Units: 5000 [IU] | SUBCUTANEOUS | @ 20:00:00

## 2024-07-23 MED ADMIN — heparin (porcine) 5,000 unit/mL injection 5,000 Units: 5000 [IU] | SUBCUTANEOUS | @ 03:00:00

## 2024-07-23 MED ADMIN — atorvastatin (LIPITOR) tablet 40 mg: 40 mg | ORAL | @ 13:00:00

## 2024-07-23 MED ADMIN — insulin lispro (HumaLOG) injection CORRECTIONAL 0-20 Units: 0-20 [IU] | SUBCUTANEOUS | @ 03:00:00

## 2024-07-23 MED ADMIN — insulin lispro (HumaLOG) injection CORRECTIONAL 0-20 Units: 0-20 [IU] | SUBCUTANEOUS | @ 22:00:00

## 2024-07-23 MED ADMIN — tacrolimus (ENVARSUS XR) extended release tablet 7 mg: 7 mg | ORAL | @ 13:00:00

## 2024-07-23 MED ADMIN — famotidine (PEPCID) tablet 20 mg: 20 mg | ORAL | @ 13:00:00

## 2024-07-23 MED ADMIN — valsartan (DIOVAN) tablet 160 mg: 160 mg | ORAL | @ 13:00:00

## 2024-07-23 MED ADMIN — insulin lispro (HumaLOG) inj PERCENTAGE MEAL EATEN 4 Units: 4 [IU] | SUBCUTANEOUS | @ 22:00:00

## 2024-07-23 MED ADMIN — insulin lispro (HumaLOG) inj PERCENTAGE MEAL EATEN 4 Units: 4 [IU] | SUBCUTANEOUS | @ 17:00:00

## 2024-07-23 MED ADMIN — insulin lispro (HumaLOG) inj PERCENTAGE MEAL EATEN 4 Units: 4 [IU] | SUBCUTANEOUS | @ 13:00:00

## 2024-07-23 MED ADMIN — senna (SENOKOT) tablet 2 tablet: 2 | ORAL | @ 03:00:00

## 2024-07-23 MED ADMIN — dexAMETHasone (DECADRON) tablet 2 mg: 2 mg | ORAL | @ 03:00:00

## 2024-07-23 MED ADMIN — dexAMETHasone (DECADRON) tablet 2 mg: 2 mg | ORAL | @ 13:00:00 | Stop: 2024-07-23

## 2024-07-23 NOTE — Progress Notes (Signed)
 Physical Medicine and Rehabilitation  Daily Progress Note [facility]    ASSESSMENT:     Frank Leach is a 73 y.o. male admitted to [facility] for glioblastoma status post resection.    Rehab Impairment Group Code [facility]):  (Brain Dysfunction) 02.1 Non-Traumatic   Etiology: Glioblastoma    PLAN:     This patient is admitted to the Physical Medicine and Rehabilitation - Inpatient - D service from 8am-5pm on weekdays for questions regarding this patient. After hours, weekends, and holidays please contact the 1st Call resident pager       REHAB:   - PT and OT to maximize functional status with mobility and ADLs as well as prevention of joint contracture.   - SLP for cognitive and swallow function.  - Neuropsych for higher level cognitive evaluation and coping.  - RT for community re-integration, education, and leisure support services.  - To be discussed in weekly Interdisciplinary Team Conference.     Glioblastoma   Procedure date: 07/11/24; Dr. Zettie  Presented with two weeks of gait imbalance and right sided weakness. Found to have a frontoparietal lesion now status post resection with neurosurgery. Completed post-operative Keppra  prophylaxis during acute admission. Pathology consistent with glioblastoma. Neuro-oncology and radiation oncology consulted during acute admission and plan for concurrent radiation and temozolomide  chemotherapy. Currently on a steroid taper.   - dexamethasone  2mg  Q12 hours (12/14 - 12/17)  Followed by 2mg  daily x 3 days (12/18 - 12/20)  Followed by 1mg  daily x 3 days (12/21 - 12/23), then stop  - famotidine  20mg  daily   - Tylenol  650mg  Q6hrs PRN  [ ]  staple removal; POD 14 ~12/19  [ ]  neurosurgery follow up  [x]  radiation oncology follow up; plan for RT to start 12/30 (outpatient)  [x]  neuro-oncology follow up; plan to start temozolomide  (new start) at the time of radiation therapy; pending MAP approval for financial assistance     HTN  - HOLD amlodipine  10mg  daily   - atorvastatin  40mg  daily   - valsartan  160mg  daily      S/p DDKT  Baseline creatining 1.7-1.9; stable. Off mycophenolate  in the setting of malignancy as above. Continued on tacrolimus .  - tacrolimus  7mg  daily   - tacrolimus  trough; goal level 4-6 ng/mL  - after completion of dexamethasone  as above, start prednisone  5mg  daily on 12/24 (ordered)  [ ]  nephrology consult      T2DM  - NPH 8 units Q12 hrs  - Lispro 6 units TID w/meals  - SSI     Daily Checklist  - Diet: regular  - DVT PPX: SQ heparin   - GI PPX: famotidine      DISPO: Patient to be discussed at weekly interdisciplinary team conference.   - EDD: TBD.   - Follow-up: PCP, PM&R, neurosurgery, neuro oncology, radiation oncology    SUBJECTIVE and PROGRESS WITH FUNCTIONAL ACTIVITIES:     Interval Events:   NAEO. Slept well overnight. Eating well. Denies pain. No chest pain, SOB, abd pain. +BM. No urinary complaints. Therapy evaluations planned for today.     OBJECTIVE:     Vital signs (last 24 hours):  Temp:  [36.4 ??C (97.5 ??F)-36.5 ??C (97.7 ??F)] 36.5 ??C (97.7 ??F)  Pulse:  [51-53] 51  Resp:  [18-20] 20  BP: (134-143)/(68-74) 134/74  MAP (mmHg):  [90-91] 91  SpO2:  [97 %-100 %] 100 %  BMI (Calculated):  [24.23] 24.23    Intake/Output (last 3 shifts):  I/O last 3 completed shifts:  In: 0  Out: 800 [Urine:800]    Physical Exam:   GEN: sitting in WC in bathroom, working with OT  HEENT: sclera anicteric, conjunctiva clear, MMM, trachea midline  CV: warm extremities, well perfused, no peripheral edema  RESP:  NWOB on RA  ABD: not distended  SKIN: no visible masses, lesions, rashes, ecchymoses on clothed exam   MSK: no notable contractures or joint swelling  NEURO: awake and alert, responds appropriately to questions   PSYCH: euthymic mood, appropriate affect, though process logical    Medications:  Scheduled Scheduled meds with Route[1]  PRN acetaminophen , 650 mg, Q6H PRN  bisacodyl, 10 mg, Daily PRN  calcium carbonate, 200 mg elem calcium, TID PRN  dextrose  in water , 12.5 g, Q15 Min PRN  guaiFENesin, 100 mg, Q4H PRN  melatonin, 3 mg, Nightly PRN  ondansetron , 4 mg, Q6H PRN        Labs/Studies: Reviewed.    Radiology Results: Reviewed    Quality Indicators      Hearing, Speech, and Vision  Ability to Hear: Adequate  Ability to See in Adequate Light: Adequate  Expression of Ideas and Wants: Without difficulty  Understanding Verbal and Non-Verbal Content: Understands    Cognitive Pattern Assessment  Cognitive Pattern Assessment Used: BIMS  Brief Interview for Mental Status (BIMS)  Repetition of Three Words (First Attempt): 3  Temporal Orientation: Year: Correct  Temporal Orientation: Month: Accurate within 5 days  Temporal Orientation: Day: Correct  Recall: Sock: Yes, no cue required  Recall: Blue: Yes, no cue required  Recall: Bed: Yes, no cue required  BIMS Summary Score: 15       Nutritional Approaches  Nutritional Approach: None of the above    ADLs  Admission Current   Eating      CARE Score -        CARE Score -     Oral Hygiene       CARE Score -         CARE Score -     Bladder Continence       Bowel Continence       Toileting Hygiene      CARE Score -        CARE Score -      Toilet Transfer      CARE Score -        CARE Score -     Shower/Bathe Self      CARE Score -         CARE Score -     Upper Body Dressing      CARE Score -         CARE Score -     Lower Body Dressing      CARE Score -         CARE Score -     On/Off Footwear      CARE Score -         CARE Score -           Transfers Admission Current   Bed to Chair      CARE Score -         CARE Score -       Lying to Sitting      CARE Score -         CARE Score -     Roll Left/Right      CARE Score -         CARE Score -  Sit to Lying      CARE Score -         CARE Score -     Sit to Stand      CARE Score -         CARE Score -           Mobility Admission Current   Walk 10 Feet      CARE Score -        CARE Score -       Walk 50 Feet 2 Turns      CARE Score -        CARE Score -     Walk 150 Feet      CARE Score - CARE Score -     Walk 10 Feet Uneven      CARE Score -         CARE Score -     1 Step (Curb)      CARE Score -         CARE Score -     4 Steps      CARE Score -         CARE Score -     12 Steps      CARE Score -         CARE Score -     Picking Up Object      CARE Score -         CARE Score -     Wheelchair/Scooter Use        Wheel 50 Feet 2 Turns                               Wheel 150 Feet                                         [1]    [Provider Hold] amlodipine  (NORVASC ) tablet 10 mg Daily    atorvastatin  (LIPITOR ) tablet 40 mg Daily    dexAMETHasone  (DECADRON ) tablet 2 mg Q12H SCH    famotidine  (PEPCID ) tablet 20 mg Daily    heparin  (porcine) 5,000 unit/mL injection 5,000 Units Q8H SCH    insulin  lispro (HumaLOG ) inj PERCENTAGE MEAL EATEN 4 Units 3xd Meals    insulin  lispro (HumaLOG ) injection CORRECTIONAL 0-20 Units ACHS    insulin  NPH (HumuLIN ,NovoLIN ) injection 5 Units Q12H SCH    senna (SENOKOT) tablet 2 tablet Nightly    tacrolimus  (ENVARSUS  XR) extended release tablet 7 mg Daily    valsartan  (DIOVAN ) tablet 160 mg Daily

## 2024-07-23 NOTE — Progress Notes (Addendum)
 12/17: due to high copay and no other specialty meds at [facility] (mycophenolate  discontinued, envarsus  from mfg), messaged oncology team at [facility] for guidance, patient enrollment. Patient is also currently admitted inpatient -ef    [facility] Specialty Medication Onboarding    Specialty Medication: temozolomide  140 mg capsule (TEMODAR )  Prior Authorization: Not Required   Financial Assistance: No - patient doesn't qualify for additional assistance   Copay/Day Supply: $97.10 / 21 days     Insurance Restrictions: None     Notes to Pharmacist:   Credit Card on File: yes  Start Date on Rx:  07/18/24  Delivery Method (based on home address currently on file): Courier      The triage team has completed the benefits investigation and has determined that the patient is able to fill this medication at [facility]. Please contact the patient to complete the onboarding or follow up with the prescribing physician as needed.

## 2024-07-23 NOTE — Consults (Signed)
 Speech-Language Pathology Cognitive - Linguistic Evaluation   Evaluation (07/23/24 1000)          Patient Name:  Frank Leach      Medical Record Number: [id number]   Date of Birth: 1951/02/13  Gender: Male              Post-Discharge Therapy Recommendations        Discharge Recommendations: SLP services not indicated       Barriers to Discharge: Impaired Balance, Gait instability, Functional strength deficits     Assessment/Session Summary      SLP Treatment Diagnosis: r/o cognitive linguistic impairment, r/o dysphagia     Assessment: Pt assessed today using portions of the [place]'s Cognitive Examination and informal measures. Pt presents with mild deficits in attention, auditory comprehension, and short-term memory on purely structured tasks. However suspect this may not represent a true measure of pt's cognitive level of function, as performance on functional and informal tasks was highly accurate. Pt self-assesses cognition to be at baseline, which was confirmed by pt's wife who was present at bedside. Pt presents with fluent and intelligible speech in conversation, without evidence of dysarthria or word retrieval deficits. Pt's wife notes x5 instances of transient dysarthria which have not been observed since tumor resection. Occasional disorganization noted, though suspect a component of anxiety in the assessment process, as pt's thought process became more consistently organized as session progressed and rapport increased. Pt reports his wife supports him in completion of IADLs at baseline.     In regards to swallowing, pt with no overt s/s of aspiration or oral dysphagia with current regular diet/thin liquids. Continue current diet with meds whole as tolerated.    Through discussion with pt and spouse at bedside, pt presents with cognition at or approaching prior baseline LOF. Pt and spouse are desiring a focus on physical rehab at this time. 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     SLP to discharge at this time. Please reconsult if new concerns arise.       Recommended Compensatory Techniques: Extra processing time, Repetition     Prognosis: Good     Positive Indicators: Family support, participation     Prior Level of Function     Prior Function: Required assistance to manage medications, Required assistance to manage finances, Active driver prior to admission, Retired, On disability  Other Prior Function Comments: Prior to admission pt lived at home with his wife, Frank Leach. Frank Leach manages the finances and pt's medications at baseline.     Educational Status: High school diploma              Leisure/Hobbies: Pt enjoys sports, taking care of the house, and spending time with his grandchildren          Patient at end of session: All needs in reach, Friends/Family present, In chair     Patient/Caregiver Reports: Pt and spouse report at least 5 episodes of transient dysarthria, though note this has not recurred since resection. Agreeable to ST evaluation.    Living Situation  Living Environment: House  Lives With: Spouse  Home Living: Two level home, Able to Live on main level with bedroom/bathroom, Stairs to alternate level with rails, Stairs to enter with rails, Handicapped height toilet, Tub/shower unit  Rail placement (outside): Bilateral rails in reach  Number of Stairs to Enter (outside): 2  Rail placement (inside): [employer]  on left side  Number of Stairs to Alternate level (inside): 2  Caregiver Identified?: Yes  Caregiver Availability: Intermittent  Caregiver Ability: Limited lifting     Past Medical History[1]   Social History     Tobacco Use    Smoking status: Never    Smokeless tobacco: Never   Substance Use Topics    Alcohol use: No     Past Surgical History[2] Family History[3]     Patient has no known allergies.     Medical Tests / Procedures Comments: MRI Brain 12/5: Impression --Postoperative changes of resection of the left frontal mass with small volume subdural and resection bed blood products. Nodular enhancement around the surgical bed along the inferior aspect of the resection bed may represent residual disease. MRI Brain 11/28: Impression  Peripherally enhancing mass within the left frontal lobe measuring up to 4.4 cm, concerning for malignancy.      Objective Findings     Activity Tolerance: Patient tolerated treatment well     Precautions / Restrictions  Required Braces or Orthoses: Non-applicable  Precautions / Restrictions comments: -  Equipment/Environment: None    Communication Preference: Verbal     Pain: no s/s of pain.       Swallow Status: Pt with no overt s/s of aspiration or oral dysphagia with currently prescribed diet of regular solids and thin liquids. Continue current diet, meds whole as tolerated.  Motor Speech: Dysphonia  Motor Speech comments: Speech fluent and intelligible without evidence of dysarthria. Moderate dysphonia characterized by hoarse and breathy vocal quality which pt reports is baseline. Pt and spouse with no concerns about voice at this time.  Oral Mechanism : WNL     Orientation: Pt AAOx4, though full comprehension of prognosis noted to be limited.  Attention: Complex attention is limited  Attention Comments: ACE-III Attention: 14/18, pt and spouse assess attention to be at baseline     Memory: Short term memory is within functional limits  Memory Comments: ACE-III Memory: 19/26 - Mildly reduced for purely structured information, however pt independent to recall events of AM OT session, list current medications, and relate details of events leading to admission and occurring throughout hospital stay with confirmation by pt's wife. Functional short-term memory appears WFL.       Problem Solving Comments: Hypothetical Reasoning ?s: 9/9     Safety/Judgement: Safety/Judgement is within functional limits  Safety/Judgement Comments: Safety problem solving ?s: 8/8, additionally pt indepenent to state call don't fall and recommendations for safety provided by OT in AM eval session.    Insight/Mental Flexibility/Initiation/Processing: Mildly disorganized, Perseveration is not present, Task initiation is within functional limits, Insight is within functional limits  Other Comments: Pt with mild difficulty in thought organization for Generative Naming/Fluency tasks (5/14) and in conversation, though pt/spouse report no changes from baseline    Verbal Expression: Verbal Expression is within functional limits, Generative naming is impaired, Repetition is within functional limits     Verbal Expression Comments: Mildly increased time required for repetition, confrontation naming WFL, Generative Naming reduced but without evidence of paraphasias    Auditory Comprehension: Yes/No Questions Within Functional Limits, Basic Questions Within Functional Limits, Complex Questions Within Functional Limits, Single-step commands Within Functional Limits, Multi-step commands impaired, Basic Conversation Comprehension is within functional limits  Comprehension Comments: Mildly extended time required for processing complex commands, though pt completed accurately. Pt and spouse report no changes from baseline.    Pragmatics/Prosody: Topic Initiation is within functional limits, Topic Maintenance is within functional  limits, Pragmatics are within functional limits, Prosody is within functional limits, Affect is within functional limits, Eye contact is within functional limits, Turn Taking is within functional limits       Written Expression: Written Expression is impaired, Right hand dominant  Written Expression Comments: Pt unable to write with dominant hand. States he does not write with his L hand. Able to demonstrate cell phone use with L with spelling, grammar intact.    Reading Comprehension: Reading ability was not formally assessed         Visual Processing: Accurate for single word reading, dot counting, figure/ground interpretation    PLAN      GOALS:   Patient and Family Goal: Get back up on my feet and get back to my old self           Minutes per day: D/C Services for Days per week: D/C Services                        SLP Individual [mins]: 60    The care for this patient was completed by Leita FORBES Hamilton, SLP:       I attest that I have reviewed the above information.  Signed: Leita FORBES Hamilton, SLP  Filed 07/23/2024                   [1]   Past Medical History:  Diagnosis Date    Anemia     Cancer    (CMS-HCC)     clear cell adenocarcinoma of left kidney    Diabetes mellitus (CMS-HCC)     Diabetic nephropathy (CMS-HCC)     ESRD (end stage renal disease) on dialysis    (CMS-HCC)     ESRD on dialysis (CMS-HCC)     Gout     Hyperlipidemia     Hypertension    [2]   Past Surgical History:  Procedure Laterality Date    AV FISTULA PLACEMENT      PR COLONOSCOPY FLX DX W/COLLJ SPEC WHEN PFRMD N/A 04/30/2018    Procedure: COLONOSCOPY, FLEXIBLE, PROXIMAL TO SPLENIC FLEXURE; DIAGNOSTIC, W/WO COLLECTION SPECIMEN BY BRUSH OR WASH;  Surgeon: Dorn Lynwood Lauth, MD;  Location: [facility] PROCED[facility];  Service: Gastroenterology    PR COLONOSCOPY W/BIOPSY SINGLE/MULTIPLE N/A 04/30/2018    Procedure: COLONOSCOPY, FLEXIBLE, PROXIMAL TO SPLENIC FLEXURE; WITH BIOPSY, SINGLE OR MULTIPLE;  Surgeon: Dorn Lynwood Lauth, MD;  Location: [facility];  Service: Gastroenterology    PR COLSC FLX W/RMVL OF TUMOR POLYP LESION SNARE TQ N/A 04/30/2018    Procedure: COLONOSCOPY FLEX; W/REMOV TUMOR/LES BY SNARE;  Surgeon: Dorn Lynwood Lauth, MD;  Location: [facility];  Service: Gastroenterology    PR EXCIS SUPRATENT BRAIN TUMOR Left 07/11/2024    Procedure: Left craniotomy for resection of mass;  Surgeon: Zettie Rosaria HERO, MD;  Location: OR [facility];  Service: Neurosurgery    PR EXPLORATORY OF ABDOMEN Midline 04/14/2016    Procedure: EXPLORATORY LAPAROTOMY, EXPLORATORY CELIOTOMY WITH OR WITHOUT BIOPSY(S);  Surgeon: Marsa Sam Boss, MD;  Location: MAIN OR [facility];  Service: Transplant    PR EXPLORATORY OF ABDOMEN N/A 04/16/2016    Procedure: EXPLORATORY LAPAROTOMY, EXPLORATORY CELIOTOMY WITH OR WITHOUT BIOPSY(S);  Surgeon: Marsa Sam Boss, MD;  Location: MAIN OR [place];  Service: Transplant    PR IONM 1 ON 1 IN OR W/ATTENDANCE EACH 15 MINUTES N/A 07/11/2024    Procedure: CONTINUOUS INTRAOPERATIVE NEUROPHYSIOLOGY MONITORING IN OR;  Surgeon: Zettie Rosaria HERO, MD;  Location: OR [facility];  Service: Neurosurgery    PR LAP, RADICAL NEPHRECTOMY Right 12/27/2016    Procedure: Robotic Xi Laparoscopy; Radical Nephrectomy (Incl Remove Gerota`S Fascia, Fatty Tissue, Reg Lymph Node, Adrenalectomy);  Surgeon: Clorinda Ole Chalk, MD;  Location: MAIN OR [facility];  Service: Urology    PR LIGATN ANGIOACCESS AV FISTULA Left 11/29/2022    Procedure: LIGATION OR BANDING OF ANGIOACCESS ARTERIOVENOUS FISTULA;  Surgeon: [place], Marsa Messier, MD;  Location: MAIN OR [facility];  Service: Transplant    PR MICROSURG TECHNIQUES,REQ OPER MICROSCOPE N/A 07/11/2024    Procedure: MICROSURGICAL TECHNIQUES, REQUIRING USE OF OPERATING MICROSCOPE (LIST SEPARATELY IN ADDITION TO CODE FOR PRIMARY PROCEDURE);  Surgeon: Zettie Rosaria HERO, MD;  Location: OR [facility];  Service: Neurosurgery    PR NEGATIVE PRESSURE WOUND THERAPY DME >50 SQ CM N/A 04/12/2016    Procedure: NEG PRESS WOUND TX (VAC ASSIST) INCL TOPICALS, PER SESSION, TSA GREATER THAN/= 50 CM SQUARED;  Surgeon: Marsa Messier Boss, MD;  Location: MAIN OR [place];  Service: Transplant    PR NEGATIVE PRESSURE WOUND THERAPY DME >50 SQ CM Bilateral 04/14/2016    Procedure: NEG PRESS WOUND TX (VAC ASSIST) INCL TOPICALS, PER SESSION, TSA GREATER THAN/= 50 CM SQUARED;  Surgeon: Marsa Messier Boss, MD;  Location: MAIN OR [place];  Service: Transplant    PR NEGATIVE PRESSURE WOUND THERAPY DME >50 SQ CM N/A 04/16/2016    Procedure: NEG PRESS WOUND TX (VAC ASSIST) INCL TOPICALS, PER SESSION, TSA GREATER THAN/= 50 CM SQUARED;  Surgeon: Marsa Messier Boss, MD;  Location: MAIN OR [facility];  Service: Transplant    PR NEPHRECTOMY, W/PART. URETECTOMY Bilateral 04/11/2016    Procedure: LAPAROSCOPY, SURGICAL, NEPHRECTOMY WITH TOTAL URETERECTOMY;  Surgeon: Marsa Messier Boss, MD;  Location: MAIN OR [facility];  Service: Transplant    PR REMV KIDNEY,W/RIB RESECTION Bilateral 04/11/2016    Procedure: NEPHRECTOMY, INCLUDING PARTIAL URETERECTOMY, ANY OPEN APPROACH INCLUDING RIB RESECTION;  Surgeon: Marsa Messier Boss, MD;  Location: MAIN OR [facility];  Service: Transplant    PR STEREOTACTIC COMP ASSIST PROC,CRANIAL,INTRADURAL N/A 07/11/2024    Procedure: STEREOTACTIC COMPUTER-ASSISTED (NAVIGATIONAL) PROCEDURE; CRANIAL, INTRADURAL;  Surgeon: Zettie Rosaria HERO, MD;  Location: OR [facility];  Service: Neurosurgery    PR TRANSPLANT,PREP LIVING  RENAL GRAFT N/A 06/22/2019    Procedure: [facility] STD PREP LIVING DONOR RENAL ALLGRFT (OPEN/LAPROSC) PRIOR TO TRANSPLANT, INC DISSECT/REM AS NECESS;  Surgeon: Marsa Messier Boss, MD;  Location: MAIN OR [facility];  Service: Transplant    PR TRANSPLANTATION OF KIDNEY N/A 06/22/2019    Procedure: RENAL ALLOTRANSPLANTATION, IMPLANTATION OF GRAFT; WITHOUT RECIPIENT NEPHRECTOMY;  Surgeon: Marsa Messier Boss, MD;  Location: MAIN OR [facility];  Service: Transplant    SPLENECTOMY     [3]   Family History  Problem Relation Age of Onset    Kidney disease Mother     Cancer Father     Kidney disease Brother

## 2024-07-23 NOTE — Consults (Signed)
 Care Management  Initial Transition Planning Assessment    CM met with patient and spouse to complete initial assessment.  Patient's discharge address and phone number verified as correct in demographics. Patient lives in a Single Story Home  with 2 stairs to enter and has Bilateral Handrails outside reach.  Pt lives with spouse.  Patient has the following DME in place at home, BSC/RW/SPC(walking stick) GB shower/shower chair.  Patient is currently not open to  [facility] agency.   spouse is this patient's Insurance Risk Surveyor and will be picked up by spouse at discharge.  Patient has a PCP (Abby Edsel Lease).  Patient has Insurance nurse, learning disability medicare advantage until 08/06/24 then will have [employer] medicare advantage).       Care Manager provided patient and/or family with a [facility] My Chart Brochure and review how to establish an account. CM will follow for discharge planning needs.       Type of Residence: Mailing Address:  [address]  [place] [address]  Contacts: Extended Emergency Contact Information  Primary Emergency Contact: Chong,Shirley   [place]  Home Phone: [phone]  Mobile Phone: [phone]  Relation: Spouse  Interpreter needed? No  Secondary Emergency Contact: Parquet,Cheryl   [place]  Mobile Phone: [phone]  Relation: Daughter    Patient Phone Number: [phone]          Medical Provider(s): Lease Stefano Edsel, MD  Reason for Admission: Admitting Diagnosis:  brain tumor  Past Medical History:   has a past medical history of Anemia, Cancer    (CMS-HCC), Diabetes mellitus (CMS-HCC), Diabetic nephropathy (CMS-HCC), ESRD (end stage renal disease) on dialysis    (CMS-HCC), ESRD on dialysis (CMS-HCC), Gout, Hyperlipidemia, and Hypertension.  Past Surgical History:   has a past surgical history that includes AV fistula placement; pr negative pressure wound therapy dme >50 sq cm (N/A, 04/12/2016); pr nephrectomy, w/part. uretectomy (Bilateral, 04/11/2016); pr remv kidney,w/rib resection (Bilateral, 04/11/2016); pr exploratory of abdomen (Midline, 04/14/2016); pr negative pressure wound therapy dme >50 sq cm (Bilateral, 04/14/2016); pr negative pressure wound therapy dme >50 sq cm (N/A, 04/16/2016); pr exploratory of abdomen (N/A, 04/16/2016); pr lap, radical nephrectomy (Right, 12/27/2016); Splenectomy; pr colonoscopy flx dx w/collj spec when pfrmd (N/A, 04/30/2018); pr colsc flx w/rmvl of tumor polyp lesion snare tq (N/A, 04/30/2018); pr colonoscopy w/biopsy single/multiple (N/A, 04/30/2018); pr transplantation of kidney (N/A, 06/22/2019); pr transplant,prep living  renal graft (N/A, 06/22/2019); pr ligatn angioaccess av fistula (Left, 11/29/2022); pr excis supratent brain tumor (Left, 07/11/2024); pr stereotactic comp assist proc,cranial,intradural (N/A, 07/11/2024); pr ionm 1 on 1 in or w/attendance each 15 minutes (N/A, 07/11/2024); and pr microsurg techniques,req oper microscope (N/A, 07/11/2024).   Previous admit date: 07/11/2024    Primary Insurance- Payor: [employer]IC[employer] ADV / Plan: [employer] MEDICARE ADV / Product Type: *No Product type* /   Secondary Insurance - None  Preferred Pharmacy - [facility]  [facility]  [facility] - [place], [place] - [address]  [place]  OUTPT PHARMACY [facility]  [id number] - [place] - [address] [place] [address]  [facility] - [place], [place] - [address]  [facility]                        General  Care Manager / Social Worker assessed the patient by : In person interview with patient, Discussion with Clinical  Care team, Medical record review, In person interview with family  Orientation Level: Oriented X4  Functional level prior to admission: Independent  Reason for referral: Discharge Planning, Durable Medical Equipment    Contact/Decision Maker  Extended Emergency Contact Information  Primary Emergency Contact: Lomax,Shirley   [place]  Home Phone: [phone]  Mobile Phone: [phone]  Relation: Spouse  Interpreter needed? No  Secondary Emergency Contact: Parquet,Cheryl   [place]  Mobile Phone: [phone]  Relation: Daughter    Legal Next of Kin / Guardian / POA / Advance Directives     HCDM (patient stated preference): Miggins,Shirley - Spouse - [phone]    Advance Directive (Medical Treatment)  Does patient have an advance directive covering medical treatment?: Patient has advance directive covering medical treatment, copy in chart.  Reason patient does not have an advance directive covering medical treatment:: Patient does not wish to complete one at this time.    Health Care Decision Maker [HCDM] (Medical & Mental Health Treatment)  Healthcare Decision Maker: HCDM documented in the HCDM/Contact Info section.  Information offered on HCDM, Medical & Mental Health advance directives:: Patient declined information.    Advance Directive (Mental Health Treatment)  Does patient have an advance directive covering mental health treatment?: Patient does not have advance directive covering mental health treatment.  Reason patient does not have an advance directive covering mental health treatment:: HCDM documented in the HCDM/Contact Info section.    Readmission Information    Have you been hospitalized in the last 30 days?: Yes  Name of Hospital: [facility]  Were you being cared for at a skilled nursing facility:: No     What day were you discharged from that hospital or facility?: 07/22/24  Number of Days between previous discharge and readmission date: 1-3 days    Type of Readmission: Planned Readmission      Patient Information  Lives with: Spouse/significant other    Type of Residence: Private residence        Location/Detail: [place]    Support Systems/Concerns: Spouse    Responsibilities/Dependents at home?: No    Home Care services in place prior to admission?: No Equipment Currently Used at Home: cane, straight, commode chair, grab bar, tub/shower, shower chair, walker, rolling       Currently receiving outpatient dialysis?: No       Financial Information       Need for financial assistance?: No       Social Drivers of Health  Social Drivers of Health were addressed in provider documentation.  Please refer to patient history.  Social Drivers of Health     Food Insecurity: No Food Insecurity (07/07/2024)    Hunger Vital Sign     Worried About Running Out of Food in the Last Year: Never true     Ran Out of Food in the Last Year: Never true   Tobacco Use: Low Risk (07/23/2024)    Patient History     Smoking Tobacco Use: Never     Smokeless Tobacco Use: Never     Passive Exposure: Not on file   Transportation Needs: No Transportation Needs (07/22/2024)    PRAPARE - Transportation     Lack of Transportation (Medical): No     Lack of Transportation (Non-Medical): No   Alcohol Use: Not on file   Housing: Low Risk (07/07/2024)    Housing     Within the past 12 months, have you ever stayed: outside, in a car,  in a tent, in an overnight shelter, or temporarily in someone else's home (i.e. couch-surfing)?: No     Are you worried about losing your housing?: No   Physical Activity: Not on file   Utilities: Low Risk (07/07/2024)    Utilities     Within the past 12 months, have you been unable to get utilities (heat, electricity) when it was really needed?: No   Stress: Not on file   Interpersonal Safety: Not At Risk (07/22/2024)    Interpersonal Safety     Unsafe Where You Currently Live: No     Physically Hurt by Anyone: No     Abused by Anyone: No   Substance Use: Not on file (06/11/2023)   Intimate Partner Violence: Not on file   Social Connections: Not on file   Financial Resource Strain: Low Risk (07/07/2024)    Overall Financial Resource Strain (CARDIA)     Difficulty of Paying Living Expenses: Not hard at all   Health Literacy: Medium Risk (07/22/2024)    Health Literacy     : Rarely   Internet Connectivity: Not on file       Complex Discharge Information    Is patient identified as a difficult/complex discharge?: No         Discharge Needs Assessment  Concerns to be Addressed: adjustment to diagnosis/illness, basic needs, coping/stress, decision-making, care coordination/care conferences    Clinical Risk Factors: New Diagnosis, > 65, Multiple Diagnoses (Chronic)    Barriers to taking medications: No    Prior overnight hospital stay or ED visit in last 90 days: Yes      Anticipated Changes Related to Illness: inability to care for self, inability to care for someone else    Equipment Needed After Discharge: other (see comments) (tbd)    Discharge Facility/Level of Care Needs: other (see comments) (home with family HH vs outpt therapy)    Readmission  Risk of Unplanned Readmission Score: UNPLANNED READMISSION SCORE: 18.3%  Predictive Model Details          18% (Medium)  Factor Value    Calculated 07/23/2024 12:04 24% Number of active inpatient medication orders 34    [place] Risk of Unplanned Readmission Model 11% Diagnosis of cancer present     10% Number of hospitalizations in last year 2     10% ECG/EKG order present in last 6 months     8% Encounter of ten days or longer in last year present     7% Imaging order present in last 6 months     6% Age 4     6% Number of ED visits in last six months 1     4% Active anticoagulant inpatient medication order present     4% Active corticosteroid inpatient medication order present     4% Diagnosis of renal failure present     4% Charlson Comorbidity Index 3     2% Future appointment scheduled     1% Current length of stay 1.01 days     1% Active ulcer inpatient medication order present      Readmitted Within the Last 30 Days? (No if blank) Yes  Patient at risk for readmission?: Yes    Discharge Plan  Screen findings are: Discharge planning needs identified or anticipated (Comment). (CM will follow closely for dc needs)    Expected Discharge Date: tbd    Expected Transfer from Critical Care:  n/a    Quality data for continuing care services shared with  patient and/or representative?: Yes  Patient and/or family were provided with choice of facilities / services that are available and appropriate to meet post hospital care needs?: Yes   List choices in order highest to lowest preferred, if applicable. : will offer choice    Initial Assessment complete?: Yes    Arland MARLA Sieving, RN  July 23, 2024 2:29 PM

## 2024-07-23 NOTE — Plan of Care (Signed)
 No acute events overnight. Got prn melatonin x1 with some relief, slept 2-3 hours. Otherwise voided with minimal PVR. Denies pain. Free of injury and AFVSS.

## 2024-07-23 NOTE — Plan of Care (Signed)
 Shift Summary  A&O x4. Conti x2. LBM 12/17. No pain reported. Blood glucose monitored. Wife bedside during part of shift. Friendly and cooperative.   Occupational therapy evaluation and training were completed, with moderate assistance required for ADLs and recommendations for continued therapy and supervision at home.   Safety interventions and fall reduction strategies were maintained throughout the shift, with no reported falls or injuries.   Discharge planning included assessment of home environment, support systems, and equipment needs, with family involvement in care discussions.   MRI findings and patient/caregiver reports were reviewed, with no acute neurological changes documented since resection.   Overall, patient remained oriented and stable, with ongoing support and therapy needs identified for discharge planning.    Absence of New-Onset Illness or Injury: No new neurological deficits or injuries were documented during the shift, and orientation remained intact throughout. MRI findings and patient/caregiver reports were reviewed, with no acute changes noted since resection.    Improved Ability to Complete Activities of Daily Living: Patient required moderate assistance for basic ADLs and tolerated occupational therapy sessions well, with recommendations for ongoing therapy and supervision at home. Family support and equipment needs were discussed in preparation for discharge.    Absence of Fall and Fall-Related Injury: Fall reduction program and safety interventions were consistently maintained, with hourly visual checks and use of safety devices for transfers throughout the shift. No falls or injuries were reported.        Problem: Rehabilitation (IRF) Plan of Care  Goal: Absence of New-Onset Illness or Injury  Outcome: Shift Focus  Intervention: Prevent Fall and Fall Injury  Recent Flowsheet Documentation  Taken 07/23/2024 1445 by Koleen Ronal CROME, RN  Safety Interventions: fall reduction program maintained  Taken 07/23/2024 1245 by Koleen Ronal CROME, RN  Safety Interventions: fall reduction program maintained  Taken 07/23/2024 1000 by Koleen Ronal CROME, RN  Safety Interventions: fall reduction program maintained  Taken 07/23/2024 0800 by Koleen Ronal CROME, RN  Safety Interventions: fall reduction program maintained     Problem: Self-Care Deficit  Goal: Improved Ability to Complete Activities of Daily Living  Outcome: Shift Focus     Problem: Wound  Goal: Absence of Infection Signs and Symptoms  Outcome: Shift Focus  Goal: Skin Health and Integrity  Intervention: Optimize Skin Protection  Recent Flowsheet Documentation  Taken 07/23/2024 1445 by Koleen Ronal CROME, RN  Pressure Reduction Techniques: frequent weight shift encouraged  Taken 07/23/2024 1245 by Koleen Ronal CROME, RN  Pressure Reduction Techniques: frequent weight shift encouraged  Taken 07/23/2024 1000 by Koleen Ronal CROME, RN  Pressure Reduction Techniques: frequent weight shift encouraged  Taken 07/23/2024 0800 by Koleen Ronal CROME, RN  Pressure Reduction Techniques: frequent weight shift encouraged     Problem: Fall Injury Risk  Goal: Absence of Fall and Fall-Related Injury  Outcome: Shift Focus  Intervention: Promote Injury-Free Environment  Recent Flowsheet Documentation  Taken 07/23/2024 1445 by Koleen Ronal CROME, RN  Safety Interventions: fall reduction program maintained  Taken 07/23/2024 1245 by Koleen Ronal CROME, RN  Safety Interventions: fall reduction program maintained  Taken 07/23/2024 1000 by Koleen Ronal CROME, RN  Safety Interventions: fall reduction program maintained  Taken 07/23/2024 0800 by Koleen Ronal CROME, RN  Safety Interventions: fall reduction program maintained

## 2024-07-23 NOTE — Consults (Signed)
 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 Kidney allograft function (stable):  - Serum creatinine remains within baseline of approximately 1.7-1.9.   - Recommend ~2x/weekly BMP to ensure stability of Cr and K, as we have been adjusting tacrolimus     # Immunosuppression:  - Off mycophenolate  in setting of malignancy   - Envarsus  increased to 7 mg daily (12/15) in setting of lower tac troughs  - Please obtain trough tacrolimus  trough levels prior to the morning dose of the medication and approximately 24hrs after prior dose. The goal tacrolimus  level is 4-6 ng/mL.  - Once the patient has completed his course of dexamethasone , would start prednisone  5 mg daily.    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 Do not expect nephrotoxic effect with this chemo regimen.     RECOMMENDATIONS:   - No changes to current immunosuppression, we will follow up today's tacrolimus  level   - Transplant patients with an open wound require wound care with sterile water  only. The patient should be counseled on this at the time of discharge if they have not already been doing this.  - We will continue to follow.     Elyn JINNY Favorite, MD  07/23/2024 8:29 AM     Medical decision-making for 07/23/24  Findings / Data     Patient has: []  acute illness w/systemic sxs  [mod]  []  two or more stable chronic illnesses [mod]  []  one chronic illness with acute exacerbation [mod]  []  acute complicated illness  [mod]  []  Undiagnosed new problem with uncertain prognosis  [mod] [x]  illness posing risk to life or bodily function (ex. AKI)  [high]  []  chronic illness with severe exacerbation/progression  [high]  []  chronic illness with severe side effects of treatment  [high] Kidney transplant recipient, brain mass Probs At least 2:  Probs, Data, Risk   I reviewed: [x]  primary team note  []  consultant note(s)  []  external records [x]  chemistry results  [x]  CBC results  []  blood gas results  []  Other []  procedure/op note(s)   []  radiology report(s)  []  micro result(s)  []  w/ independent historian(s) Cr at baseline, reviewed primary team notes, low tacrolimus  levels  >=3 Data Review (2 of 3)    I independently interpreted: []  Urine Sediment  []  Renal US  []  CXR Images  []  CT Images  []  Other []  EKG Tracing  Any     I discussed: []  Pathology results w/ QHPs(s) from other specialties  []  Procedural findings w/ QHPs(s) from other specialties []  Imaging w/ QHP(s) from other specialties  [x]  Treatment plan w/ QHP(s) from other specialties Plan discussed with primary team Any     Mgm't requires: []  Prescription drug(s)  [mod]  []  Kidney biopsy  [mod]  []   Central line placement  [mod] [x]  High risk medication use and/or intensive toxicity monitoring [high]  []  Renal replacement therapy [high]  []  High risk kidney biopsy  [high]  []  Escalation of care  [high]  []  High risk central line placement  [high] Immunosuppression: high risk for infection Risk      _____________________________________________________________________________________    Interval:  Transferred to [facility] for AIR. He feels well and is looking forward to working with therapies. Eating/drinking well.   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     INPATIENT MEDICATIONS:  Current Medications[1]    Physical Exam:   Vitals:    07/22/24 1151 07/23/24 0434   BP: 143/69 134/74   Pulse: 52 51   Resp: 18 20   Temp: 36.4 ??C (97.6 ??F) 36.5 ??C (97.7 ??F)   TempSrc: Oral Oral   SpO2: 97% 100%   Weight: 70.2 kg (154 lb 12.2 oz)    Height: 170.2 cm (5' 7)      No intake/output data recorded.    Intake/Output Summary (Last 24 hours) at 07/23/2024 0829  Last data filed at 07/23/2024 0435  Gross per 24 hour   Intake 0 ml   Output 800 ml   Net -800 ml         Constitutional: no acute distress, up chair, staples in scalp  Heart: regular rate and rhythm  Lungs: normal WOB, on room air, CTAB   Ext: no lower extremity edema    Neuro: awake, alert                   [1]   Current Facility-Administered Medications:     acetaminophen  (TYLENOL ) tablet 650 mg, Oral, Q6H PRN    [Provider Hold] amlodipine  (NORVASC ) tablet 10 mg, Oral, Daily    atorvastatin  (LIPITOR ) tablet 40 mg, Oral, Daily    bisacodyl (DULCOLAX) suppository 10 mg, Rectal, Daily PRN    calcium carbonate (TUMS) chewable tablet 200 mg elem calcium, Oral, TID PRN    dexAMETHasone  (DECADRON ) tablet 2 mg, Oral, Q12H SCH    dextrose  50 % in water  (D50W) 50 % solution 12.5 g, Intravenous, Q15 Min PRN    famotidine  (PEPCID ) tablet 20 mg, Oral, Daily    guaiFENesin (ROBITUSSIN) oral syrup, Oral, Q4H PRN    heparin  (porcine) 5,000 unit/mL injection 5,000 Units, Subcutaneous, Q8H SCH    insulin  lispro (HumaLOG ) inj PERCENTAGE MEAL EATEN 4 Units, Subcutaneous, 3xd Meals    insulin  lispro (HumaLOG ) injection CORRECTIONAL 0-20 Units, Subcutaneous, ACHS    insulin  NPH (HumuLIN ,NovoLIN ) injection 5 Units, Subcutaneous, Q12H SCH    melatonin tablet 3 mg, Oral, Nightly PRN    ondansetron  (ZOFRAN -ODT) disintegrating tablet 4 mg, Oral, Q6H PRN    senna (SENOKOT) tablet 2 tablet, Oral, Nightly    tacrolimus  (ENVARSUS  XR) extended release tablet 7 mg, Oral, Daily    valsartan  (DIOVAN ) tablet 160 mg, Oral, Daily

## 2024-07-23 NOTE — Consults (Addendum)
 PHYSICAL THERAPY  Evaluation (07/23/24 1500)          Patient Name:  Frank Leach       Medical Record Number: [id number]   Date of Birth: [birth year]  Gender: Male            Post-Discharge Physical Therapy Recommendations     Transfer : SaraStedy, 1 person assist (Guarding R side, ensuring R UE and R foot placement d/t pt R inattention)    Post Discharge Recommendations: Pt requires family training, TBD     PT DME Recommendations: To Be Determined     Barriers to Discharge: Home environment barriers, Mobility deficits, Self-Care deficits    Assessment     Treatment Diagnosis: R sided weakness and R inattention with associated endurance, gait, and functional mobility deficits      Assessment : Frank Leach, [name], is a 73 y.o. male with PMH of renal transplant who presented with gait imbalance and right sided weakness, found to have frontoparietal mass status post resection. Pathology consistent with glioblastoma. He is now admitted to [facility] for comprehensive interdisciplinary rehabilitation. Pt will benefit from PT to address impairments, maximize independence, and minimize caregiver burden. Pt demonstrating decreased R sided deficits and associated R inattention, strength, endurance, functional mobility, and gait deficits requiring multi-modal cues for attention to R side and sequencing for safe mobility. Pt still demonstrating R lateral LOB's requiring VC's and maxA at times during session d/t R inattention with foot placement. Pt using no device at baseline, but unable to assess today d/t safety concerns. Pt able to perform STS with RW, but recommneding RW use with therapy only.     Problem List: Fall risk, Gait deviation, Decreased strength, Decreased mobility, Impaired balance, Decreased endurance, Impaired ADLs, Impaired fine motor skills, Impaired Gross Motor Skills, Impaired sensation, Decreased coordination                 Personal Factors/Comorbidities Present: 3+ factors Clinical Presentation: Evolving   Eval Complexity : High Complexity          Education: PT POC, goals, expectations, orientation to rehab, and general BLE HEP (Seated: 1x10 ankle pumps, LAQ's, hip flex)         Subjective     Prognosis: Good    Patient at end of session: All needs in reach, In bed    Positive Indicators: PLOF, motivation, family support    Prior Level of Function and Home Situation    Prior Functional Status: Pt reporting indep at baseline using no device. Retired but still out and about in the community.     Services patient receives prior to admission: OT, PT      Patient reports: Pt agreeable to PT, I'm sorry, I normally do better than this      Living Situation  Living Environment: House  Lives With: Spouse  Home Living: One level home, Able to Live on main level with bedroom/bathroom, Stairs to enter with rails (Pt stating he would have to sidestep upon entering bathroom to reach the commode if he had to use a RW. Has temperpedic bed with adjustable HOB)  Rail placement (outside): Bilateral rails out of reach  Number of Stairs to Enter (outside): 2  Rail placement (inside): Rail on left side  Number of Stairs to Alternate level (inside): 2 (Stairs going down into garage inside the home, otherwise level)  Caregiver Identified?: Yes  Caregiver Availability: 24 hours (Wife stating she will always be at home given pt's  current status)  Caregiver Ability: Limited lifting     Equipment available at home: Bedside commode, Rolling walker, Straight cane (Has BSC they are using over the commode to elevate the seat. Has old wooden cane, shower chair, grab bars in shower and around toilet)     Vitals/Orthostatics : Seated: BP 138/71, SPO2 100%, HR 60. Pt stating that is his normal range for BP      Past Medical History[1]         Social History     Tobacco Use    Smoking status: Never    Smokeless tobacco: Never   Substance Use Topics    Alcohol use: No       Past Surgical History[2] Family History[3]     Allergies: Patient has no known allergies.      Objective Findings          Activity Tolerance: Tolerated treatment well     Precautions / Restrictions  Precautions: Falls precautions, Aspiration precautions  Weight Bearing Status:  (N/A)  Required Braces or Orthoses: Non-applicable  Precautions / Restrictions comments: -    Weight Bearing Status:  (N/A)      Required Braces or Orthoses: Non-applicable    Equipment / Environment: Wheelchair     Communication Preference: Verbal              Pain Comments: no c/o pain                               Cognition comment: Noted R sided inattention requiring frequent cues for attention. Multi-modal cues and continued demonstration for sequencing mobility as pt having diffuculty executing performance even after PT demonstration  Orientation: Oriented x4     Visual/Perception: Wears Glasses/Contacts all the time (glasses)  Visual/Perception comment: Pt states no visual changes. Visual tracking normal      Skin Inspection: Incision C/D/I  Skin Inspection comment: Staples present at craniotomy incision      Upper Extremities  UE ROM: Left WFL, Right Impaired/Limited  RUE ROM Impairment: Limited AROM  UE Strength: Left WFL, Right Impaired/Limited  RUE Strength Impairment: Reduced strength  UE comment: RUE shld ROM grossly limited with shoulder shrug 3-/5. R elbow flex and ext 3+/5 with noted decreased R grip strength compared to L.       Lower Extremities  LE ROM: Left WFL, Right Impaired/Limited  RLE ROM Impairment: Limited AROM  LE Strength: Left WFL, Right Impaired/Limited  RLE Strength Impairment: Reduced strength  LE comment: RLE ROM grossly limited due to strength deficits (hip flex, knee ext and flex, ank DF and PF: all 3+/5)       Motor/Sensory/Neuro Comments: Finger to nose slighlty slower R compared to L, decreased accuracy R>L with heel to shin with noted difficulty d/t strength deficits. Pt stating no numbness or tingling in BUE's or BLE's although patient occasionally stating his LLE feels tight and different than his RLE.  Balance comment row: Pt able to maintain static sitting balance at EOB and mat table with no UE support. Pt able to maintain static standing balance when R foot is properely placed, noted R lateral LOB's x3 d/t inattention to R foot placement.       Endurance: Noted RLE fatigue with continued activity causing inc assist t/o session, seated rest breaks reduced assist level required       Goals:   Patient and Family Goals: To go home    Roll  Left to Right  Discharge Goal: Independent  Comment: Unable to perform without VC/TC. On mat table: roll R with CGA;pt requiring maxA to roll L with cues for R side attention as pt oftening leaving behind R arm and not using R LE to assist with turn  Reason if not Attempted: Safety concerns  CARE Score - Roll Left and Right: 88    Sit to Lying  Discharge Goal: Independent  Comment: Unable to perform without VC/TC. On mat table: Short sit->supine mod A for trunk management and minA for RLE assist onto mat. Short sit->supine in hospital bed, CGA with pt using L rail to lower trunk onto bed. Pt able to bridge x3 on mat table and x2 on hospital bed with PT bracing RLE to boost upward once supine  Reason if not Attempted: Safety concerns  CARE Score - Sit to Lying: 88    Lying to Sitting  Discharge Goal: Independent  Comment: Unable to perform without VC/TC. On mat table: supine->short sit modA for trunk management  Reason if not Attempted: Safety concerns  CARE Score - Lying to Sitting on Side of Bed: 88         Sit to Stand  Discharge Goal: Supervision or touching assistance  Comment: Attempt STS from recliner with no device with pt unable to perform safely. STS using RW from recliner, standard chairx2, mat table, and multiple from mat table requiring from minA-maxA with frequent VC's for R foot placement and hand placement on RW; pt frequently trying stand with both hands on walker opposed to using LUE to push surface to stand. Pt able to perform 3 STS with SaraStedy with CGA using BUE's on bar after cues for proper R foot placement.  Reason if not Attempted: Safety concerns  CARE Score - Sit to Stand: 12    Chair to Chair/Bed  Discharge Goal: Supervision or touching assistance  Comment: Stand step transfer w/c->mat table, w/c<->standard chair, and w/c->bed using RW grossly CGA with instances of maxA d/t R inattention to foot placement resulting in R lateral leaning and LOB's requiring max VC's for correction and L weight shift to place RLE under pt  Reason if not Attempted: Safety concerns  CARE Score - Chair/Bed-to-Chair Transfer: 88         Car Transfer  Discharge Goal: Supervision or touching assistance  Comment: Stand step transfer w/c<->car with RW, grossly CGA but requiring maxA when turning to sit in w/c as pt leaving R foot resulting in R lateral LOB requiring max VC's to shift weight toward the L to allow pt to correct R foot placement  Reason if not Attempted: Safety concerns  CARE Score - Car Transfer: 88     Pick Up Objects  Discharge Goal: Supervision or touching assistance  Reason if not Attempted: Safety concerns  CARE Score - Picking Up Object: 88           Walk 10 Feet   Discharge Goal: Supervision or touching assistance  Reason if not Attempted: Safety concerns  CARE Score - Walk 10 Feet: 88    Walk 50 Feet with Turns  Discharge Goal: Supervision or touching assistance  Reason if not Attempted: Safety concerns  CARE Score - Walk 50 Feet with Two Turns: 88    Walk 150 Feet  Discharge Goal: Supervision or touching assistance  Comment: No device at baseline. Pt amb 5', 25', and 20' as part of TUG trial with RW, CGA. Noted poor R foot clearance and placement  esp during turns resulting in maxA and max VC's to recover balance. Reduced gait speed  Reason if not Attempted: Safety concerns  CARE Score - Walk 150 Feet: 88    Walk 10 Feet on Uneven Surface  Discharge Goal: Supervision or touching assistance  Reason if not Attempted: Safety concerns  CARE Score - Walking 10 Feet on Uneven Surfaces: 88           1 Step (Curb)  Discharge Goal: Supervision or touching assistance  Reason if not Attempted: Safety concerns  CARE Score - 1 Step (Curb): 88    4 Steps  Discharge Goal: Supervision or touching assistance  Reason if not Attempted: Safety concerns  CARE Score - 4 Steps: 88    12 Steps  Discharge Goal: Not applicable  Comment: Pt asc and desc 3 4 steps x 2 using bilat rail; on first attempt pt completing asc with reciprocal pattern and desc with step to pattern leading with LLE for asc and desc;VC's for second trial on sequencing but pt still requiring VC's as his R foot got stuck on the last step d/t leading with LLE for desc. Pt able asc and desc 3 4 steps with L rail for asc and asc laterally with LLE leading, R rail to desc diagnonally to simulate STE at home. Grossly minA, but requiring up to modA and max verbal and tactile cues for proper sequencing and foot placement t/o  Reason if not Attempted: Safety concerns  CARE Score - 12 Steps: 88           Wheel 50 Feet with Two Turns  Discharge Goal: Independent  Physical Assist; Total Assist  Reason if not Attempted:   CARE Score - Wheel 50 Feet with Two Turns: 1  Type of Wheelchair/Scooter: Manual    Wheel 150 Feet  Discharge Goal: Independent  Comment: Pt attempt to propel manual w/c with BUE's but unable to perform d/t RUE weakness. Instructed pt on L hemi technique with pt only able propel 3' despite additional VC's and explanations.  Physical Assist; Total Assist  Reason if not Attempted:   CARE Score - Wheel 150 Feet: 1  Type of Wheelchair/Scooter: Manual           Short and Long Term Goals  SHORT GOAL #1: Pt will complete functional outcome measure (e.g. TUG if appropriate)  SHORT GOAL #2: .  Long Term Goal #1: Pt will be supervision for all transfers within 4 wks.        Plan     Follow-up / Frequency  Minutes per day: 1-2 hours/day  Days per week: 5-7     Planned Interventions: Education - Patient, Education - Family / caregiver, Endurance activities, Home exercise program, Investment banker, operational, Functional mobility, Functional cognition, Balance activities, Neuromuscular re-education, Therapeutic exercise, Therapeutic activity, Wheelchair training, Teacher, early years/pre, Self-care / Home training, Stair training     PT Individual [mins]: 120     The care for this patient was completed by Vernell FORBES Paschal, PT:  A student was present and participated in the care. Licensed/Credentialed therapist was physically present and immediately available to direct and supervise tasks that were related to patient management. The direction and supervision was continuous throughout the time these tasks were performed.     I attest that I have reviewed the above information.  Signed: Vernell FORBES Paschal, PT  Filed 07/23/2024                                  [  1]   Past Medical History:  Diagnosis Date    Anemia     Cancer    (CMS-HCC)     clear cell adenocarcinoma of left kidney    Diabetes mellitus (CMS-HCC)     Diabetic nephropathy (CMS-HCC)     ESRD (end stage renal disease) on dialysis    (CMS-HCC)     ESRD on dialysis (CMS-HCC)     Gout     Hyperlipidemia     Hypertension    [2]   Past Surgical History:  Procedure Laterality Date    AV FISTULA PLACEMENT      PR COLONOSCOPY FLX DX W/COLLJ SPEC WHEN PFRMD N/A 04/30/2018    Procedure: COLONOSCOPY, FLEXIBLE, PROXIMAL TO SPLENIC FLEXURE; DIAGNOSTIC, W/WO COLLECTION SPECIMEN BY BRUSH OR WASH;  Surgeon: Dorn Lynwood Lauth, MD;  Location: [facility]ED[facility];  Service: Gastroenterology    PR COLONOSCOPY W/BIOPSY SINGLE/MULTIPLE N/A 04/30/2018    Procedure: COLONOSCOPY, FLEXIBLE, PROXIMAL TO SPLENIC FLEXURE; WITH BIOPSY, SINGLE OR MULTIPLE;  Surgeon: Dorn Lynwood Lauth, MD;  [facility]: [facility];  Service: Gastroenterology    PR COLSC FLX W/RMVL OF TUMOR POLYP LESION SNARE TQ N/A 04/30/2018    Procedure: COLONOSCOPY FLEX; W/REMOV TUMOR/LES BY SNARE;  Surgeon: Dorn Lynwood Lauth, MD;  Location: [facility];  Service: Gastroenterology    PR EXCIS SUPRATENT BRAIN TUMOR Left 07/11/2024    Procedure: Left craniotomy for resection of mass;  Surgeon: Zettie Rosaria HERO, MD;  Location: OR [facility];  Service: Neurosurgery    PR EXPLORATORY OF ABDOMEN Midline 04/14/2016    Procedure: EXPLORATORY LAPAROTOMY, EXPLORATORY CELIOTOMY WITH OR WITHOUT BIOPSY(S);  Surgeon: Marsa Sam Boss, MD;  Location: MA[facility] OR [facility];  Service: Transplant    PR EXPLORATORY OF ABDOMEN N/A 04/16/2016    Procedure: EXPLORATORY LAPAROTOMY, EXPLORATORY CELIOTOMY WITH OR WITHOUT BIOPSY(S);  Surgeon: Marsa Sam Boss, MD;  Location: MAIN OR [facility];  Service: Transplant    PR IONM 1 ON 1 IN OR W/ATTENDANCE EACH 15 MINUTES N/A 07/11/2024    Procedure: CONTINUOUS INTRAOPERATIVE NEUROPHYSIOLOGY MONITORING IN OR;  Surgeon: Zettie Rosaria HERO, MD;  Location: OR [facility];  Service: Neurosurgery    PR LAP, RADICAL NEPHRECTOMY Right 12/27/2016    Procedure: Robotic Xi Laparoscopy; Radical Nephrectomy (Incl Remove Gerota`S Fascia, Fatty Tissue, Reg Lymph Node, Adrenalectomy);  Surgeon: Clorinda Ole Chalk, MD;  Location: MAIN OR [facility];  Service: Urology    PR LIGATN ANGIOACCESS AV FISTULA Left 11/29/2022    Procedure: LIGATION OR BANDING OF ANGIOACCESS ARTERIOVENOUS FISTULA;  Surgeon: [place], Marsa Sam, MD;  Location: MAIN OR [facility];  Service: Transplant    PR MICROSURG TECHNIQUES,REQ OPER MICROSCOPE N/A 07/11/2024    Procedure: MICROSURGICAL TECHNIQUES, REQUIRING USE OF OPERATING MICROSCOPE (LIST SEPARATELY IN ADDITION TO CODE FOR PRIMARY PROCEDURE);  Surgeon: Zettie Rosaria HERO, MD;  Location: OR [facility];  Service: Neurosurgery    PR NEGATIVE PRESSURE WOUND THERAPY DME >50 SQ CM N/A 04/12/2016    Procedure: NEG PRESS WOUND TX (VAC ASSIST) INCL TOPICALS, PER SESSION, TSA GREATER THAN/= 50 CM SQUARED;  Surgeon: Marsa Sam Boss, MD;  Location: MAIN OR [place];  Service: Transplant    PR NEGATIVE PRESSURE WOUND THERAPY DME >50 SQ CM Bilateral 04/14/2016    Procedure: NEG PRESS WOUND TX (VAC ASSIST) INCL TOPICALS, PER SESSION, TSA GREATER THAN/= 50 CM SQUARED;  Surgeon: Marsa Sam Boss, MD;  Location: MAIN OR [place];  Service: Transplant    PR NEGATIVE PRESSURE WOUND THERAPY DME >50  SQ CM N/A 04/16/2016    Procedure: NEG PRESS WOUND TX (VAC ASSIST) INCL TOPICALS, PER SESSION, TSA GREATER THAN/= 50 CM SQUARED;  Surgeon: Marsa Sam Boss, MD;  Location: MAIN OR [facility];  Service: Transplant    PR NEPHRECTOMY, W/PART. URETECTOMY Bilateral 04/11/2016    Procedure: LAPAROSCOPY, SURGICAL, NEPHRECTOMY WITH TOTAL URETERECTOMY;  Surgeon: Marsa Sam Boss, MD;  Location: MAIN OR [facility];  Service: Transplant    PR REMV KIDNEY,W/RIB RESECTION Bilateral 04/11/2016    Procedure: NEPHRECTOMY, INCLUDING PARTIAL URETERECTOMY, ANY OPEN APPROACH INCLUDING RIB RESECTION;  Surgeon: Marsa Sam Boss, MD;  Location: MAIN OR [facility];  Service: Transplant    PR STEREOTACTIC COMP ASSIST PROC,CRANIAL,INTRADURAL N/A 07/11/2024    Procedure: STEREOTACTIC COMPUTER-ASSISTED (NAVIGATIONAL) PROCEDURE; CRANIAL, INTRADURAL;  Surgeon: Zettie Rosaria HERO, MD;  Location: OR [facility];  Service: Neurosurgery    PR TRANSPLANT,PREP LIVING  RENAL GRAFT N/A 06/22/2019    Procedure: [facility] STD PREP LIVING DONOR RENAL ALLGRFT (OPEN/LAPROSC) PRIOR TO TRANSPLANT, INC DISSECT/REM AS NECESS;  Surgeon: Marsa Sam Boss, MD;  Location: MAIN OR [facility];  Service: Transplant    PR TRANSPLANTATION OF KIDNEY N/A 06/22/2019    Procedure: RENAL ALLOTRANSPLANTATION, IMPLANTATION OF GRAFT; WITHOUT RECIPIENT NEPHRECTOMY;  Surgeon: Marsa Sam Boss, MD;  Location: MAIN OR [facility];  Service: Transplant    SPLENECTOMY     [3]   Family History  Problem Relation Age of Onset    Kidney disease Mother     Cancer Father     Kidney disease Brother

## 2024-07-23 NOTE — Consults (Signed)
 OCCUPATIONAL THERAPY  Evaluation (07/23/24 0800)      Patient Name:  Frank Leach      Medical Record Number: [id number]   Date of Birth: [birth year]  Gender: Male         Post-Discharge Occupational Therapy Recommendations     Transfer : 1 person assist, Stand Pivot     OT Post Acute Discharge Recommendations: Outpatient     OT DME Recommendations: Three in one commode, Tub Transfer Bench      Barriers to Discharge: Home environment barriers, Mobility deficits, Self-Care deficits     Assessment/Session Summary     Treatment Diagnosis: Patient presenting with decreased ability to complete self care skills and functional transfers secondary to decreased RUE/RLE strength/motor control, decreased static/dynamic standing balance, decreased RUE fine/gross motor coordination, decreased functional activity tolerance, and current medical status.     Assessment: Frank Leach is a 73 y.o. male with PMH of renal transplant who presented with gait imbalance and right sided weakness, found to have frontoparietal mass status post resection. Pathology consistent with glioblastoma. He is now admitted to [facility] for comprehensive interdisciplinary rehabilitation. Patient presenting with decreased ability to complete self care skills and functional transfers secondary to decreased RUE/RLE strength/motor control, decreased static/dynamic standing balance, decreased RUE fine/gross motor coordination, decreased functional activity tolerance, and current medical status. At this time, patient would benefit from skilled OT services for improved independence in self care skills and functional transfers. Recommendations for next session: morning ADLs (show reacher & sock aid); RUE dexterity testing (Nine Hole Peg and Box & Block); RUE neuro re-ed      Problem List: Decreased activity tolerance, Decreased cognition, Decreased coordination, Decreased endurance, Decreased mobility, Decreased safety awareness, Decreased strength, Fall risk, Gait deviation, Impaired ADLs, Impaired balance, Impaired fine motor skills, Decreased range of motion, Visual perception deficits    Personal Factors/Comorbidities (Occupational Profile and History Review): Extensive (High)  Specific Comorbidities : PMH of renal transplant  Assessment of Occupational Performance : Balance, Cognitive skills, Dexterity, Endurance, Fine or gross motor coordination, Mobility, Strength        Clinical Decision Making: High Complexity     Skilled Interventions Performed: ADL retraining, Safety education, Transfer training, Functional mobility  Today's Interventions: OT evaluation; ADL training; AIR orientation    Patient state at end of session: Patient agreeable to OT; seated upright in recliner with call light within reach at end of session.    Subjective      Prognosis: Excellent     Positive Indicators: PLOF, Supportive spouse; motivated     Prior Level of Function and Home Situation     Prior Functional Status: Prior to hospitalization, patient was previously independent with basic ADLs, IADLs, and functional mobility without an AD. Patient is retired from the [employer] in [place] and he lives with his wife who is also retired. Patient enjoys sports and has two grandchildren. Patient stating I'm looking for a full recovery.     Service Patient receives prior to hospitalization: NONE         Patient / Caregiver reports: You're on it today, thank you Shonika Kolasinski.     Living Situation  Living Environment: House  Lives With: Spouse  Home Living: Two level home, Able to Live on main level with bedroom/bathroom, Stairs to alternate level with rails, Stairs to enter with rails, Handicapped height toilet, Tub/shower unit  Rail placement (outside): Bilateral rails in reach  Number of Stairs to Enter (outside): 2  Rail placement (  inside): Rail on left side  Number of Stairs to Alternate level (inside): 2  Caregiver Identified?: Yes  Caregiver Availability: Intermittent  Caregiver Ability: Limited lifting     Equipment available at home: Walker - Rolling, Medical Laboratory Scientific Officer     Vitals/Orthostatics: No s/s of dizziness during session     Past Medical History[1]   Social History     Tobacco Use    Smoking status: Never    Smokeless tobacco: Never   Substance Use Topics    Alcohol use: No     Past Surgical History[2] Family History[3]     Patient has no known allergies.    Objective Findings     Medical Tests / Procedures: Reviewed in EPIC; Left craniotomy for resection of brain mass (12/5)      Activity Tolerance: Tolerated treatment well     Precautions: Falls precautions             Required Braces or Orthoses: Non-applicable     Equipment / Environment: Agricultural consultant, Wheelchair       Communication Preference: Verbal     Pain Comments: No reports of pain during session                    Cognition  Orientation Level: Oriented x 4  Arousal/Alertness: Appropriate responses to stimuli  Attention Span: Appears intact  Memory: Appears intact  Following Commands: Follows all commands without difficulty  Safety Judgment: Good awareness of safety precautions  Awareness of Errors: Good awareness of errors made  Problem Solving: Able to problem solve independently  Comments: Patient pleasant, cooperative, and motivated to participate in OT evaluation this morning. R inattention noted throughout evaluation, however, patient making attempts to fully incorporate RUE into bimanual tasks.     Vision  Baseline Vision: Wears glasses all the time  Acute Visual Changes: Other, No acute deficits identified  Vision Comments: Patient reporting improved visual acuity since surgery.    Hearing  Hearing: No deficit identified       Hand Function  Right Hand Function: Right hand function impaired  Right Hand Impairment: grip strength fair, coordination impaired, serial opposition impaired, finger to nose impaired  Left Hand Function: Left hand grip strength, ROM and coordination WNL  Hand Function comments: RUE fine/gross motor coordination impaired; increased time required to complete serial opposition and disdiadokinesia; 2+/5 MMT grip strength noted on R side. Patient using LUE functionally throughout self care tasks.  Hand Dominance: Right     Skin Inspection  Skin Inspection: Incision C/D/I  Skin Inspection comment: Staples present at craniotomy incision     UE ROM - Strength  UE ROM/Strength: Left WFL, Right Impaired/Limited  RUE Impairment: Reduced strength, Limited AROM  UE ROM/ Strength Comment: RUE shoulder flexion to ~100 degrees shoulder flexion; noting 2+/5 MMT; RUE elbow flexion/extension 3+/5 MMT    LE ROM / Strength  LE ROM/Strength: Left WFL, Right Impaired/Limited  RLE Impairment: Reduced strength, Limited AROM  LE ROM/ Strength Comment: See PT evaluation for formal testing     Coordination  Coordination: Impaired     Sensation  RUE Sensation: RUE intact  LUE Sensation: LUE intact  RLE Sensation: RLE intact  LLE Sensation: LLE intact  Sensory/ Proprioception/ Stereognosis comments: Patient denying any numbness/tingling in BUE/BLE throughout evaluation     Balance: Patient able to complete sit <> stands and functional ambulation to/from bathroom using RW needing minA and increased time to advance RLE.     Functional Mobility  Transfer  Assistance Needed: Yes  Transfers: Min assist (Patient able to complete sit <> stands and functional ambulation to/from bathroom using RW needing minA and increased time to advance RLE.)  Bed Mobility Assistance Needed: No (Patient OOB at beginning/end of session)  Mobility Comments: Patient able to complete sit <> stands and functional ambulation to/from bathroom using RW needing minA and increased time to advance RLE.      IADLs: Not tested     GOALS:   Patient and Family Goals: To learn how to walk all over again.     Eating   Discharge Goal: Independent  Assistance Needed: Set-up / clean-up  Comment: OT provided patient with built up utensils to trial during self feeding  CARE Score - Eating: 5    Oral Hygiene  Discharge Goal: Independent  Assistance Needed: Physical assistance  Physical Assistance Level: 25% or less  Comment: Standing at baseline; patient able to shave using electric razor and complete oral care seated at sink with set up and increased time.  CARE Score - Oral Hygiene: 3         Toileting Hygiene  Discharge Goal: Independent  Assistance Needed: Physical assistance  Physical Assistance Level: 26%-50%  Comment: Using RW and 3in1 commode over toilet  CARE Score - Toileting Hygiene: 3    Toileting Transfer  Discharge Goal: Independent  Assistance Needed: Physical assistance  Physical Assistance Level: 25% or less  Comment: Using RW and 3in1 commode over toilet  CARE Score - Toilet Transfer: 3           Shower/Bathe  Discharge Goal: Independent  Assistance Needed: Physical assistance  Physical Assistance Level: 25% or less  Comment: Seated bathing  CARE Score - Shower/Bathe Self: 3           UE Dressing  Discharge Goal: Independent  Assistance Needed: Physical assistance  Physical Assistance Level: 26%-50%  CARE Score - Upper Body Dressing: 3     LE Dressing  Discharge Goal: Independent  Assistance Needed: Physical assistance  Physical Assistance Level: 26%-50%  CARE Score - Lower Body Dressing: 3    Footwear  Discharge Goal: Independent  Assistance Needed: Physical assistance  Physical Assistance Level: 26%-50%  CARE Score - Putting On/Taking Off Footwear: 3          Short and Long Term Goals  SHORT GOAL #1: Patient will demonstrate good understanding of RUE neuro re-ed HEP.       SHORT GOAL #2: Patient will complete TTB transfer in TLA with SBA needing no verbal cues for R sided attention.            Long Term Goal #1: Patient will complete basic ADLs and functional transfers at a modI level.       Plan     Follow-up / Frequency  Minutes per day: 1-2 hours/day  Days per Week: 5-7     Planned Interventions: Adaptive equipment, ADL retraining, Balance activities, Bed mobility, Compensatory tech. training, Conservation, Education - Patient, Education - Family / caregiver, Home exercise program, Functional mobility, Functional cognition, Environmental support, Endurance activities, Neuromuscular re-education, Positioning, Therapeutic exercise, Safety education, Range of motion, Transfer training, UE Strength / coordination exercise    OT Individual [mins]: 120      I attest that I have reviewed the above information.  Signed: Leandrew CHRISTELLA Ada, OT  Filed 07/23/2024                                       [  1]   Past Medical History:  Diagnosis Date    Anemia     Cancer    (CMS-HCC)     clear cell adenocarcinoma of left kidney    Diabetes mellitus (CMS-HCC)     Diabetic nephropathy (CMS-HCC)     ESRD (end stage renal disease) on dialysis    (CMS-HCC)     ESRD on dialysis (CMS-HCC)     Gout     Hyperlipidemia     Hypertension    [2]   Past Surgical History:  Procedure Laterality Date    AV FISTULA PLACEMENT      PR COLONOSCOPY FLX DX W/COLLJ SPEC WHEN PFRMD N/A 04/30/2018    Procedure: COLONOSCOPY, FLEXIBLE, PROXIMAL TO SPLENIC FLEXURE; DIAGNOSTIC, W/WO COLLECTION SPECIMEN BY BRUSH OR WASH;  Surgeon: Dorn Lynwood Lauth, MD;  Location: [facility]ED[facility];  Service: Gastroenterology    PR COLONOSCOPY W/BIOPSY SINGLE/MULTIPLE N/A 04/30/2018    Procedure: COLONOSCOPY, FLEXIBLE, PROXIMAL TO SPLENIC FLEXURE; WITH BIOPSY, SINGLE OR MULTIPLE;  Surgeon: Dorn Lynwood Lauth, MD;  Location: [facility];  Service: Gastroenterology    PR COLSC FLX W/RMVL OF TUMOR POLYP LESION SNARE TQ N/A 04/30/2018    Procedure: COLONOSCOPY FLEX; W/REMOV TUMOR/LES BY SNARE;  Surgeon: Dorn Lynwood Lauth, MD;  Location: [facility];  Service: Gastroenterology    PR EXCIS SUPRATENT BRAIN TUMOR Left 07/11/2024    Procedure: Left craniotomy for resection of mass;  Surgeon: Zettie Rosaria HERO, MD;  Location: OR [facility];  Service: Neurosurgery    PR EXPLORATORY OF ABDOMEN Midline 04/14/2016    Procedure: EXPLORATORY LAPAROTOMY, EXPLORATORY CELIOTOMY WITH OR WITHOUT BIOPSY(S);  Surgeon: Marsa Sam Boss, MD;  Location: MAIN OR [facility];  Service: Transplant    PR EXPLORATORY OF ABDOMEN N/A 04/16/2016    Procedure: EXPLORATORY LAPAROTOMY, EXPLORATORY CELIOTOMY WITH OR WITHOUT BIOPSY(S);  Surgeon: Marsa Sam Boss, MD;  Location: MAIN OR [facility];  Service: Transplant    PR IONM 1 ON 1 IN OR W/ATTENDANCE EACH 15 MINUTES N/A 07/11/2024    Procedure: CONTINUOUS INTRAOPERATIVE NEUROPHYSIOLOGY MONITORING IN OR;  Surgeon: Zettie Rosaria HERO, MD;  Location: OR [facility];  Service: Neurosurgery    PR LAP, RADICAL NEPHRECTOMY Right 12/27/2016    Procedure: Robotic Xi Laparoscopy; Radical Nephrectomy (Incl Remove Gerota`S Fascia, Fatty Tissue, Reg Lymph Node, Adrenalectomy);  Surgeon: Clorinda Ole Chalk, MD;  Location: MAIN OR [facility];  Service: Urology    PR LIGATN ANGIOACCESS AV FISTULA Left 11/29/2022    Procedure: LIGATION OR BANDING OF ANGIOACCESS ARTERIOVENOUS FISTULA;  Surgeon: Toledo, Marsa Sam, MD;  Location: MAIN OR [facility];  Service: Transplant    PR MICROSURG TECHNIQUES,REQ OPER MICROSCOPE N/A 07/11/2024    Procedure: MICROSURGICAL TECHNIQUES, REQUIRING USE OF OPERATING MICROSCOPE (LIST SEPARATELY IN ADDITION TO CODE FOR PRIMARY PROCEDURE);  Surgeon: Zettie Rosaria HERO, MD;  Location: OR [facility];  Service: Neurosurgery    PR NEGATIVE PRESSURE WOUND THERAPY DME >50 SQ CM N/A 04/12/2016    Procedure: NEG PRESS WOUND TX (VAC ASSIST) INCL TOPICALS, PER SESSION, TSA GREATER THAN/= 50 CM SQUARED;  Surgeon: Marsa Sam Boss, MD;  Location: MAIN OR [place];  Service: Transplant    PR NEGATIVE PRESSURE WOUND THERAPY DME >50 SQ CM Bilateral 04/14/2016    Procedure: NEG PRESS WOUND TX (VAC ASSIST) INCL TOPICALS, PER SESSION, TSA GREATER THAN/= 50 CM SQUARED;  Surgeon: Marsa Sam Boss, MD;  Location: MAIN OR ;  Service: Transplant    PR NEGATIVE PRESSURE WOUND THERAPY DME >50  SQ CM N/A 04/16/2016    Procedure: NEG PRESS WOUND TX (VAC ASSIST) INCL TOPICALS, PER SESSION, TSA GREATER THAN/= 50 CM SQUARED;  Surgeon: Marsa Sam Boss, MD;  Location: MAIN OR [facility];  Service: Transplant    PR NEPHRECTOMY, W/PART. URETECTOMY Bilateral 04/11/2016    Procedure: LAPAROSCOPY, SURGICAL, NEPHRECTOMY WITH TOTAL URETERECTOMY;  Surgeon: Marsa Sam Boss, MD;  Location: MAIN OR [facility];  Service: Transplant    PR REMV KIDNEY,W/RIB RESECTION Bilateral 04/11/2016    Procedure: NEPHRECTOMY, INCLUDING PARTIAL URETERECTOMY, ANY OPEN APPROACH INCLUDING RIB RESECTION;  Surgeon: Marsa Sam Boss, MD;  Location: MAIN OR [facility];  Service: Transplant    PR STEREOTACTIC COMP ASSIST PROC,CRANIAL,INTRADURAL N/A 07/11/2024    Procedure: STEREOTACTIC COMPUTER-ASSISTED (NAVIGATIONAL) PROCEDURE; CRANIAL, INTRADURAL;  Surgeon: Zettie Rosaria HERO, MD;  Location: OR [facility];  Service: Neurosurgery    PR TRANSPLANT,PREP LIVING  RENAL GRAFT N/A 06/22/2019    Procedure: [facility] STD PREP LIVING DONOR RENAL ALLGRFT (OPEN/LAPROSC) PRIOR TO TRANSPLANT, INC DISSECT/REM AS NECESS;  Surgeon: Marsa Sam Boss, MD;  Location: MAIN OR [facility];  Service: Transplant    PR TRANSPLANTATION OF KIDNEY N/A 06/22/2019    Procedure: RENAL ALLOTRANSPLANTATION, IMPLANTATION OF GRAFT; WITHOUT RECIPIENT NEPHRECTOMY;  Surgeon: Marsa Sam Boss, MD;  Location: MAIN OR [facility];  Service: Transplant    SPLENECTOMY     [3]   Family History  Problem Relation Age of Onset    Kidney disease Mother     Cancer Father     Kidney disease Brother

## 2024-07-23 NOTE — Consults (Signed)
 Tacrolimus  Therapeutic Monitoring Pharmacy Note    Frank Leach is a 73 y.o. male continuing tacrolimus .     Indication: Kidney transplant     Date of Transplant: 06/22/2019      Prior Dosing Information: Envarsus  7 mg PO daily (inc 12/15)  FYI: Home regimen Envarsus  5 mg PO daily     Source(s) of information used to determine prior to admission dosing: Patient/Caregiver, Home Medication List, or Clinic Note    Goals:  Therapeutic Drug Levels  Tacrolimus  trough goal: 4-6 ng/mL    Additional Clinical Monitoring/Outcomes  Monitor renal function (SCr and urine output) and liver function (LFTs)  Monitor for signs/symptoms of adverse events (e.g., hyperglycemia, hyperkalemia, hypomagnesemia, hypertension, headache, tremor)    Previous Lab Results:  Tacrolimus , Trough   Date Value Ref Range Status   07/23/2024 3.3 (L) 5.0 - 15.0 ng/mL Final   07/22/2024 2.9 (L) 5.0 - 15.0 ng/mL Final   07/21/2024 1.6 (L) 5.0 - 15.0 ng/mL Final   07/20/2024 2.6 (L) 5.0 - 15.0 ng/mL Final   07/19/2024 5.1 5.0 - 15.0 ng/mL Final     Creatinine   Date Value Ref Range Status   07/19/2024 1.64 (H) 0.73 - 1.18 mg/dL Final   87/88/7974 8.27 (H) 0.73 - 1.18 mg/dL Final   87/90/7974 8.33 (H) 0.73 - 1.18 mg/dL Final        Result: Tacrolimus  trough was 3.3 ng/mL (drawn ~ 1.5 hours early)    Pharmacokinetic Considerations and Significant Drug Interactions:  Concurrent CYP3A4 substrates/inhibitors: amlodipine     Assessment/Plan:  Recommendation(s)  Today's tacrolimus  level is subtherapeutic but trending appropriately (anticipate steady state by 12/19)  Continue Envarsus  today at 7 mg PO daily     Follow-up  MWF levels ordered at 7am.   A pharmacist will continue to monitor and recommend levels as appropriate    Please page service pharmacist with questions/clarifications.    Julia Fabricio Donahue, PharmD  PGY2 Critical Care Pharmacy Resident

## 2024-07-24 LAB — BASIC METABOLIC PANEL
ANION GAP: 16 mmol/L — ABNORMAL HIGH (ref 5–14)
BLOOD UREA NITROGEN: 32 mg/dL — ABNORMAL HIGH (ref 9–23)
BUN / CREAT RATIO: 18
CALCIUM: 10 mg/dL (ref 8.7–10.4)
CHLORIDE: 109 mmol/L — ABNORMAL HIGH (ref 98–107)
CO2: 18.9 mmol/L — ABNORMAL LOW (ref 20.0–31.0)
CREATININE: 1.75 mg/dL — ABNORMAL HIGH (ref 0.73–1.18)
EGFR CKD-EPI (2021) MALE: 41 mL/min/1.73m2 — ABNORMAL LOW (ref >=60)
GLUCOSE RANDOM: 120 mg/dL (ref 70–179)
POTASSIUM: 5.1 mmol/L — ABNORMAL HIGH (ref 3.4–4.8)
SODIUM: 144 mmol/L (ref 135–145)

## 2024-07-24 LAB — CBC
HEMATOCRIT: 37.5 % — ABNORMAL LOW (ref 39.0–48.0)
HEMOGLOBIN: 12.2 g/dL — ABNORMAL LOW (ref 12.9–16.5)
MEAN CORPUSCULAR HEMOGLOBIN CONC: 32.4 g/dL (ref 32.0–36.0)
MEAN CORPUSCULAR HEMOGLOBIN: 29.8 pg (ref 25.9–32.4)
MEAN CORPUSCULAR VOLUME: 91.7 fL (ref 77.6–95.7)
MEAN PLATELET VOLUME: 9 fL (ref 6.8–10.7)
PLATELET COUNT: 202 10*9/L (ref 150–450)
RED BLOOD CELL COUNT: 4.09 10*12/L — ABNORMAL LOW (ref 4.26–5.60)
RED CELL DISTRIBUTION WIDTH: 16.9 % — ABNORMAL HIGH (ref 12.2–15.2)
WBC ADJUSTED: 9.3 10*9/L (ref 3.6–11.2)

## 2024-07-24 MED ADMIN — insulin NPH (HumuLIN,NovoLIN) injection 5 Units: 5 [IU] | SUBCUTANEOUS | @ 13:00:00 | Stop: 2024-07-24

## 2024-07-24 MED ADMIN — insulin NPH (HumuLIN,NovoLIN) injection 5 Units: 5 [IU] | SUBCUTANEOUS | @ 03:00:00

## 2024-07-24 MED ADMIN — heparin (porcine) 5,000 unit/mL injection 5,000 Units: 5000 [IU] | SUBCUTANEOUS | @ 11:00:00

## 2024-07-24 MED ADMIN — heparin (porcine) 5,000 unit/mL injection 5,000 Units: 5000 [IU] | SUBCUTANEOUS | @ 18:00:00

## 2024-07-24 MED ADMIN — heparin (porcine) 5,000 unit/mL injection 5,000 Units: 5000 [IU] | SUBCUTANEOUS | @ 03:00:00

## 2024-07-24 MED ADMIN — atorvastatin (LIPITOR) tablet 40 mg: 40 mg | ORAL | @ 13:00:00

## 2024-07-24 MED ADMIN — tacrolimus (ENVARSUS XR) extended release tablet 7 mg: 7 mg | ORAL | @ 13:00:00

## 2024-07-24 MED ADMIN — famotidine (PEPCID) tablet 20 mg: 20 mg | ORAL | @ 13:00:00

## 2024-07-24 MED ADMIN — valsartan (DIOVAN) tablet 160 mg: 160 mg | ORAL | @ 13:00:00

## 2024-07-24 MED ADMIN — dexAMETHasone (DECADRON) tablet 2 mg: 2 mg | ORAL | @ 13:00:00 | Stop: 2024-07-27

## 2024-07-24 MED ADMIN — insulin lispro (HumaLOG) inj PERCENTAGE MEAL EATEN 4 Units: 4 [IU] | SUBCUTANEOUS | @ 22:00:00

## 2024-07-24 MED ADMIN — insulin lispro (HumaLOG) inj PERCENTAGE MEAL EATEN 4 Units: 4 [IU] | SUBCUTANEOUS | @ 13:00:00

## 2024-07-24 MED ADMIN — insulin lispro (HumaLOG) inj PERCENTAGE MEAL EATEN 4 Units: 4 [IU] | SUBCUTANEOUS | @ 18:00:00

## 2024-07-24 MED ADMIN — senna (SENOKOT) tablet 2 tablet: 2 | ORAL | @ 03:00:00

## 2024-07-24 MED ADMIN — dexAMETHasone (DECADRON) tablet 2 mg: 2 mg | ORAL | @ 03:00:00 | Stop: 2024-07-23

## 2024-07-24 NOTE — Plan of Care (Signed)
 Shift Summary  dexAMETHasone  was administered during the shift and pain remained well controlled at 0.   Aspiration protocol maintained. Takes meds with applesauce.   Monitored blood glucose.   Infection prevention and protective precautions were consistently followed, and aseptic technique was maintained for the surgical site.   Fall reduction strategies, including bed in lowest position, side rails, nonskid footwear, and scheduled toileting, were implemented throughout the shift with no falls reported.   Overall, safety and comfort were maintained and no new concerns arose during the shift.

## 2024-07-24 NOTE — Progress Notes (Signed)
 [facility]    Patient Onboarding/Medication Counseling    Mr.Frank Leach is a 73 y.o. male with Glioblastoma who I am counseling today on initiation of therapy.  I am speaking to the patient's family member, wife.    Was a nurse, learning disability used for this call? No    Verified patient's date of birth / HIPAA.    Specialty medication(s) to be sent: Hematology/Oncology: Temozolomide  140mg     Non-specialty medications/supplies to be sent: ondansetron     Medications not needed at this time: none       Temodar  (temozolomide )    Medication & Administration   Dosage: Take 1 capsule (140 mg total) by mouth daily .    Administration:   Take without food (on an empty stomach).    At least 1 hour before our two hours after meals  Take with a full glass of water   Swallow capsules whole, do not open or chew.  Anti-emetics are recommended to prevent nausea and vomiting. Take prescribed anti-emetic 30 minutes prior to scheduled dose.  If you throw up after taking this drug, do not repeat the dose.    Remember to take the medication as prescribed.  Your dose may be made up of 2 or more different strengths and colors of capsules.       Adherence/Missed dose instructions:   If you miss a dose, contact your prescriber immediately for instructions for further instructions.  If you throw up after taking this drug, do not repeat the dose  Goals of Therapy     To prevent disease progression  To treat brain cancer  Side Effects & Monitoring Parameters     Commonly reported side effects:  Fatigue  Nausea, vomiting, constipation, diarrhea, stomach pain/upset stomach (Usually antiemetic prescribed for nausea, can take stool softener/Miralax  for constipation, Imodium AD/loperamide for diarrhea)  Abdominal pain  Change in taste, loss of appetite  Decreased red blood cell count (anemia) - feeling dizzy, sleepy, tired, or weak  Weight gain  Back pain, muscle pain, joint pain  Difficulty seeping  Common cold symptoms  Hair loss  Dry skin  Headache  Mouth irritation or mouth sores (may use baking soda or salt water  rinses 3-4 times daily)    The following side effects should be reported to the provider:  Signs of infection (fever >100.4, chills, mouth sores, sputum production)  Unexplained bruising or bleeding (vomiting or coughing up blood, blood that looks like coffee grounds, blood in the urine or black, red tarry stools, bruising that gets bigger without reason, any persistent or severe bleeding, impaired wound healing)  Signs of liver problems (dark urine, abdominal pain, light-colored stools, vomiting, yellow skin or eyes, not hungry)  Any abdominal burning, numbness, or tingling feeling  Signs of cerebrovascular disease (change in strength on one side is greater than the other, trouble speaking or thinking, change in balance, drooping on one side of the face, or vision changes)  Shortness of breath, excess weight gain, swelling in arms or legs  Confusion, mood changes, trouble with memory, seizures  Difficulty controlling bladder  Burning or numbness feeling  Severe headache  Vision changes  Difficulty swallowing  Severe loss of energy and strength  Pale skin, pinpoint red spots on skin  Signs of allergic reaction / anaphylaxis (wheezing, chest tightness, swelling of face, lips, tongue or throat)  Rash (physician can prescribe prednisone  to alleviate)  Breast pain    Monitoring Parameters:   CBC with differential and platelets at baseline  and during treatment  Liver function at baseline and during treatment  Evaluate pregnancy status prior to use in females of reproductive potential  Monitor for lymphopenia and for signs/symptoms of Pneumocystis jirovecii pneumonia  Monitor adherence  Contraindications, Warnings, & Precautions     Contraindications:  Hypersensitivity to temozolomide  or any component of the formulation  Hypersensitivity to dacarbazine     Warnings & Precautions:  Bone marrow suppression: Myelosuppression some with fatal outcomes, may occur. Hematologic toxicity may require treatment interruption, dose reduction, and/or discontinuation.   GI toxicity: Temozolomide  is associated with a moderate emetic potential; antiemetics may be recommended to prevent nausea and vomiting.  Hepatotoxicity: Hepatotoxicity has been reported; may be severe or fatal. Monitor liver function tests at baseline, halfway through the first cycle, prior to each subsequent cycle, and at ~2 to 4 weeks after the last temozolomide  dose.  Hypersensitivity: Allergic reactions (including anaphylaxis) have been observed with temozolomide .  Pneumocystis pneumonia: Pneumocystis jirovecii pneumonia (PCP) may occur in patients receiving temozolomide ; risk is increased in those receiving corticosteroids or with longer temozolomide  treatment regimens. Monitor all patients for development of lymphopenia and PCP.   Secondary malignancies: Cases of myelodysplastic syndromes and secondary malignancies, including myeloid leukemia, have been reported following treatment with temozolomide .  Avoid sun exposure  Hepatic impairment: Use with caution in patients with severe hepatic impairment.  Renal impairment: Use with caution in patients with severe renal impairment; has not been studied in dialysis patients.  Elderly: Patients >=13 years of age experienced a higher incidence of grade 4 neutropenia and thrombocytopenia in cycle 1 (compared to younger patients).  Polysorbate 80: Some dosage forms may contain polysorbate 80 (also known as Tweens). Hypersensitivity reactions, usually a delayed reaction, have been reported following exposure to pharmaceutical products containing polysorbate 80 in certain individuals  Temozolomide  resistance: Increased MGMT activity/levels within tumor tissue is associated with temozolomide  resistance.   Reproductive/Breast Feeding Considerations     Reproductive Considerations: Evaluate pregnancy status prior to use in females of reproductive potential. Females of reproductive potential should use effective contraception during treatment and for at least 6 months after the last temozolomide  dose. Males with pregnant partners or with male partners of reproductive potential should use condoms during treatment and for at least 3 months after the last temozolomide  dose. Males should not donate semen during treatment and for at least 3 months after the last temozolomide  dose.  Temozolomide  may impair fertility; limited data indicate changes in sperm parameters during temozolomide  treatment; however, there is no information in duration or reversibility of sperm changes.  Based on the mechanism of action and findings in animal reproduction studies, in utero exposure to temozolomide  may cause fetal harm.  Breastfeeding Considerations: It is not known if temozolomide  is present in breast milk. Due to the potential for serious adverse reactions (including myelosuppression) in the breastfed infant, breastfeeding is not recommended by the manufacturer during treatment and for at least 1 week after the last temozolomide  dose  Drug/Food Interactions     Medication list reviewed in Epic. The patient was instructed to inform the care team before taking any new medications or supplements. No drug interactions identified.   Do not receive immunizations/vaccinations without contacting doctor, including flu shot.  Food reduces the rate and extent of absorption.  Storage, Handling Precautions, & Disposal     Store at room temperature in the original container in a dry place (do not use a pillbox or store with other medications).   Caregivers helping administer medication should wear  gloves and wash hands immediately after.  This drug is considered hazardous and should be handled as little as possible.   Keep the lid tightly closed. Keep out of the reach of children and pets.  If the capsule I opened or broken, do not touch the contents. If the contents are touched or they get in the eyes, wash hands or eyes immediately.    Do not flush down a toilet or pour down a drain unless instructed to do so.  Check with your local police department or fire station about drug take-back programs in your area.     Current Medications (including OTC/herbals), Comorbidities and Allergies     Current Medications[1]    Allergies[2]    Problem List[3]    Medication list has been reviewed and updated in Epic: Yes    Allergies have been reviewed and updated in Epic: Yes    Appropriateness of Therapy     Acute infections noted within Epic:  No active infections  Patient reported infection: None    Is the medication and dose appropriate considering the patient???s diagnosis, treatment, and disease journey, comorbidities, medical history, current medications, allergies, therapeutic goals, self-administration ability, and access barriers? Yes    Prescription has been clinically reviewed: Yes      Baseline Quality of Life Assessment      How many days over the past month did your glioblastoma  keep you from your normal activities? For example, brushing your teeth or getting up in the morning. Patient  currently in rehab    Financial Information     Medication Assistance provided: None Required    Anticipated copay of $97.10 reviewed with patient. Verified delivery address.    Delivery Information     Scheduled delivery date: 08/01/24    Expected start date: 08/05/24    Medication will be delivered via Same Day courier to the prescription address in [facility].  This shipment will not require a signature.      Explained the services we provide at [facility] and that each month we would call to set up refills.  Stressed importance of returning phone calls so that we could ensure they receive their medications in time each month.  Informed patient that we should be setting up refills 7-10 days prior to when they will run out of medication.  A pharmacist will reach out to perform a clinical assessment periodically.  Informed patient that a welcome packet, containing information about our pharmacy and other support services, a Notice of Privacy Practices, and a drug information handout will be sent.      The patient or caregiver noted above participated in the development of this care plan and knows that they can request review of or adjustments to the care plan at any time.      Patient or caregiver verbalized understanding of the above information as well as how to contact the pharmacy at [phone] option 4 with any questions/concerns.  The pharmacy is open Monday through Friday 8:30am-4:30pm.  A pharmacist is available 24/7 via pager to answer any clinical questions they may have.    Patient Specific Needs     Does the patient have any physical, cognitive, or cultural barriers? Yes based on diagnosis    Does the patient have adequate living arrangements? (i.e. the ability to store and take their medication appropriately) Yes    Did you identify any home environmental safety or security hazards? No    Patient  prefers to have medications discussed with  wife or patient     Is the patient or caregiver able to read and understand education materials at a high school level or above? Yes    Patient's primary language is  English     Is the patient high risk? Yes, patient is taking oral chemotherapy. Appropriateness of therapy as been assessed    Does the patient have an additional or emergency contact listed in their chart? Yes    SOCIAL DETERMINANTS OF HEALTH     At the [facility], we have learned that life circumstances - like trouble affording food, housing, utilities, or transportation can affect the health of many of our patients.   That is why we wanted to ask: are you currently experiencing any life circumstances that are negatively impacting your health and/or quality of life? Patient declined to answer    Social Drivers of Health     Food Insecurity: No Food Insecurity (07/07/2024)    Hunger Vital Sign     Worried About Running Out of Food in the Last Year: Never true     Ran Out of Food in the Last Year: Never true   Tobacco Use: Low Risk (07/24/2024)    Patient History     Smoking Tobacco Use: Never     Smokeless Tobacco Use: Never     Passive Exposure: Not on file   Transportation Needs: No Transportation Needs (07/22/2024)    PRAPARE - Transportation     Lack of Transportation (Medical): No     Lack of Transportation (Non-Medical): No   Alcohol Use: Not on file   Housing: Low Risk (07/07/2024)    Housing     Within the past 12 months, have you ever stayed: outside, in a car, in a tent, in an overnight shelter, or temporarily in someone else's home (i.e. couch-surfing)?: No     Are you worried about losing your housing?: No   Physical Activity: Not on file   Utilities: Low Risk (07/07/2024)    Utilities     Within the past 12 months, have you been unable to get utilities (heat, electricity) when it was really needed?: No   Stress: Not on file   Interpersonal Safety: Not At Risk (07/22/2024)    Interpersonal Safety     Unsafe Where You Currently Live: No     Physically Hurt by Anyone: No     Abused by Anyone: No   Substance Use: Not on file (06/11/2023)   Intimate Partner Violence: Not on file   Social Connections: Not on file   Financial Resource Strain: Low Risk (07/07/2024)    Overall Financial Resource Strain (CARDIA)     Difficulty of Paying Living Expenses: Not hard at all   Health Literacy: Medium Risk (07/22/2024)    Health Literacy     : Rarely   Internet Connectivity: Not on file       Would you be willing to receive help with any of the needs that you have identified today? Not applicable       Aneita DELENA Merck, [facility]  [facility] Specialty Pharmacist         [1]   No current facility-administered medications for this visit.     No current outpatient medications on file.     Facility-Administered Medications Ordered in Other Visits   Medication Dose Route Frequency Provider Last Rate Last Admin    acetaminophen  (TYLENOL ) tablet 650 mg  650 mg Oral Q6H PRN Creveling, Polly, MD        [Provider Hold] amlodipine  (NORVASC ) tablet 10 mg  10 mg Oral Daily Creveling, Polly, MD        atorvastatin  (LIPITOR ) tablet 40 mg  40 mg Oral Daily Creveling, Polly, MD   40 mg at 07/24/24 9185    bisacodyl (DULCOLAX) suppository 10 mg  10 mg Rectal Daily PRN Creveling, Polly, MD        calcium carbonate (TUMS) chewable tablet 200 mg elem calcium  200 mg elem calcium Oral TID PRN Creveling, Polly, MD        dexAMETHasone  (DECADRON ) tablet 2 mg  2 mg Oral Daily Creveling, Polly, MD   2 mg at 07/24/24 9185    Followed by    NOREEN ON 07/27/2024] dexAMETHasone  (DECADRON ) tablet 1 mg  1 mg Oral Daily Creveling, Polly, MD        dextrose  50 % in water  (D50W) 50 % solution 12.5 g  12.5 g Intravenous Q15 Min PRN Creveling, Polly, MD        famotidine  (PEPCID ) tablet 20 mg  20 mg Oral Daily Creveling, Polly, MD   20 mg at 07/24/24 9185    guaiFENesin (ROBITUSSIN) oral syrup  100 mg Oral Q4H PRN Creveling, Polly, MD        heparin  (porcine) 5,000 unit/mL injection 5,000 Units  5,000 Units Subcutaneous Q8H [facility] Creveling, Polly, MD   5,000 Units at 07/24/24 9380    insulin  lispro (HumaLOG ) inj PERCENTAGE MEAL EATEN 4 Units  4 Units Subcutaneous 3xd Meals Creveling, Tandy, MD   4 Units at 07/24/24 9185    insulin  lispro (HumaLOG ) injection CORRECTIONAL 0-20 Units  0-20 Units Subcutaneous ACHS Creveling, Tandy, MD   3 Units at 07/23/24 1717    insulin  NPH (HumuLIN ,NovoLIN ) injection 5 Units  5 Units Subcutaneous Q12H [facility] Creveling, Polly, MD   5 Units at 07/24/24 0814    melatonin tablet 3 mg  3 mg Oral Nightly PRN Drouin-Allaire, Camille A, DO   3 mg at 07/23/24 0022    ondansetron  (ZOFRAN -ODT) disintegrating tablet 4 mg  4 mg Oral Q6H PRN Creveling, Polly, MD        [START ON 07/30/2024] predniSONE  (DELTASONE ) tablet 5 mg  5 mg Oral Daily Creveling, Polly, MD        senna (SENOKOT) tablet 2 tablet  2 tablet Oral Nightly Creveling, Polly, MD   2 tablet at 07/23/24 2147    tacrolimus  (ENVARSUS  XR) extended release tablet 7 mg  7 mg Oral Daily Creveling, Polly, MD   7 mg at 07/24/24 0815    valsartan  (DIOVAN ) tablet 160 mg  160 mg Oral Daily Creveling, Polly, MD   160 mg at 07/24/24 0813   [2] No Known Allergies  [3]   Patient Active Problem List  Diagnosis    ESRD (end stage renal disease) (CMS-HCC)    Hyperlipidemia    H/O splenectomy    Diabetes mellitus (CMS-HCC)    HTN (hypertension)    Bilateral renal masses    History of unilateral nephrectomy    Delirium, acute    Clear cell adenocarcinoma of left kidney    (CMS-HCC)    Kidney replaced by transplant (HHS-HCC)    Immunosuppression (HHS-HCC)    Frontal mass of brain    Bladder mass    Left leg weakness    Glioblastoma    (CMS-HCC)

## 2024-07-24 NOTE — Plan of Care (Signed)
 Shift Summary  dexAMETHasone  was administered in the morning as part of the medication regimen.   Blood glucose checked before meals as per order.   Transfer with sara stedy  Continent of bladder and bowel. 12/18.  Infection prevention and safety interventions were maintained throughout the shift, including hand hygiene and fall reduction measures.   Patient reported no pain or anxiety and demonstrated appropriate coping and emotional expression during the shift.   Overall, patient made progress in mobility and self-care activities, with stable labs and no new-onset illness or injury documented.    Absence of New-Onset Illness or Injury: No new injuries documented and infection prevention measures such as hand hygiene were promoted; vital signs and labs remained stable throughout the shift, with no acute changes noted.    Improved Ability to Complete Activities of Daily Living: Progress noted in transfers and gait with improved right side awareness and gait, moderate assistance required for mobility and dressing, and patient tolerated activities well; multiple ADLs and therapeutic activities were completed with appropriate support and cues.    Optimal Coping: Patient reported independent practice of healthy coping strategies, expressed willingness to learn compensatory techniques, and was able to express feelings independently; no anxiety or pain was reported during the shift.

## 2024-07-24 NOTE — Consults (Signed)
 Endocrine Team Diabetes New Consult Note     Consult information:  Requesting Attending Physician : Deward Adjutant, MD  Service Requesting Consult : Physical Medicine and Rehabilitation [facility])  Primary Care Provider: Kandis Stefano Houston, MD  Impression:  Frank Leach is a 73 y.o. male admitted for glioblastoma s/p resection. We have been consulted at the request of Deward Adjutant, MD to evaluate Frank Leach for hyperglycemia.     Medical Decision Making:  Diagnoses:  1.Type 2 Diabetes. Uncontrolled With hyperglycemia.  2. Nutrition: Complicating glycemic control. Increasing risk for both hypoglycemia and hyperglycemia.  3. Steroids. Complicating glycemic control and increasing risk for hyperglycemia.  4. Transplant. Complicating glycemic control and increasing risk for hyperglycemia.  5. Chronic Kidney Disease. Complicating glycemic control and increasing risk for hypoglycemia.      Studies reviewed 07/24/24:  Labs: CBC, BMP, POCT-BG, and HbA1C  Interpretation: WBCs normal. Anemia noted. Normal sodium. Hyperkalemia. Elevated Cr in the setting of known CKD. Intermittent hyperglycemia. A1C 6.8% indicates excellent control outpatient.  Notes reviewed: Primary team and nursing notes      Overall impression based on above reviews and history:  Pt with T2DM admitted to rehab s/p resection of glioblastoma.  Currently hyperglycemic in setting of high dose steroid taper. Steroid dose reduced by 50% today. Given this, will discontinue overnight basal insulin  today  Recommendations:  - NPH 5u in AM only  - lispro 4u TIDAC  - Lispro 1:20>140 ACHS  - Hypoglycemia protocol.  - POCT-BG achs.  - Ensure patient is on glucose precautions if patient taking nutrition by mouth.       Thank you for this consult. Discussed plan with primary team. We will continue to follow and make recommendations and place orders as appropriate.    Please page with questions or concerns: Ritchie Slater, [place]: [phone]  Endocrinology Diabetes Care Team on call from 6AM - 5PM on weekdays. Virtual assistance is available on the weekends from 6AM-1PM.      Subjective:  Initial HPI:  Frank Leach is a 73 y.o. male with past medical history of ESRD s/p renal transplant 2020 with renal-cell carcinoma, DM, HTN who was admitted for glioblastoma s/p resection    Patient stable overnight. He is tolerating PO diet. No nausea or vomiting. Patient's steroid dose reduced by 50% today    Diabetes History:  Patient has a history of Type 2 diabetes diagnosed at least 15+ years ago.  Diabetes is managed by: PCP, nephrologist.  Current home diabetes regimen: metformin  XR 500mg  qAM,empagliflozin  25mg  qam.  - since admission to [facility], patient has been on insulin  inpatient while on steroid taper  Current home blood glucose monitoring:  occasionally.  Hypoglycemia awareness: yes.  Complications related to diabetes: ESRD s/p transplant in 2020      Current Nutrition:  Active Orders   Diet    Nutrition Therapy Regular/House       ROS: As per HPI.    Scheduled Medications[1]    Current Outpatient Medications   Medication Instructions    acetaminophen  (TYLENOL ) 500-1,000 mg, Oral, Every 6 hours PRN    allopurinol  (ZYLOPRIM ) 100 mg, Oral, Every evening    [Paused] amlodipine  (NORVASC ) 10 mg, Oral, Daily (standard)    atorvastatin  (LIPITOR ) 40 mg, Oral, Daily (standard)    dexAMETHasone  (DECADRON ) 2 mg, Oral, Every 12 hours    ENVARSUS  XR 7 mg, Oral, Daily, Take one tablet daily with ONE 4 mg tablet for a total daily dose of 5mg     famotidine  (  PEPCID ) 20 mg, Oral, Daily (standard)    heparin  (porcine) 5,000 Units, Subcutaneous, Every 8 hours    latanoprost (XALATAN) 0.005 % ophthalmic solution 1 drop, Nightly    magnesium  oxide-Mg AA chelate (MAGNESIUM , AMINO ACID CHELATE,) 133 mg 1 tablet, Oral, 2 times a day    metFORMIN  (GLUCOPHAGE -XR) 500 mg, Oral, Daily (RT)    [Paused] olmesartan  (BENICAR ) 40 mg, Oral, Daily (standard)    ondansetron  (ZOFRAN ) 8 mg, Oral, Every 8 hours PRN, Take 1 tablet 1 hour before the chemotherapy (temozolomide ).    [Paused] predniSONE  (DELTASONE ) 5 mg, Oral, Daily (standard)    senna (SENOKOT) 8.6 mg tablet 2 tablets, Oral, Nightly    [Paused] temozolomide  (TEMODAR ) 75 mg/m2/day, Oral, Daily (standard)           Past Medical History[2]    Past Surgical History[3]    Family History[4]    Short Social History[5]    OBJECTIVE:  BP 110/67  - Pulse 57 Comment: rechecked - Temp 36.5 ??C (97.7 ??F) (Oral)  - Resp 17  - Ht 170.2 cm (5' 7)  - Wt 70.2 kg (154 lb 12.2 oz)  - SpO2 100%  - BMI 24.24 kg/m??   Wt Readings from Last 12 Encounters:   07/22/24 70.2 kg (154 lb 12.2 oz)   07/13/24 73 kg (160 lb 15 oz)   07/03/24 72.6 kg (160 lb)   01/18/24 73.2 kg (161 lb 6.4 oz)   06/08/23 68.9 kg (151 lb 12.8 oz)   03/22/23 68.3 kg (150 lb 8 oz)   02/14/23 68.4 kg (150 lb 12.8 oz)   12/21/22 68 kg (149 lb 14.4 oz)   11/27/22 67.6 kg (149 lb)   11/02/22 68.1 kg (150 lb 3.2 oz)   10/13/22 68.2 kg (150 lb 6.4 oz)   06/02/22 67 kg (147 lb 9.6 oz)     Physical Exam  Nursing note reviewed.   Constitutional:       General: He is not in acute distress.  HENT:      Head: Normocephalic and atraumatic.   Eyes:      Conjunctiva/sclera: Conjunctivae normal.   Pulmonary:      Effort: Pulmonary effort is normal. No respiratory distress.   Skin:     General: Skin is warm.   Neurological:      Mental Status: He is alert and oriented to person, place, and time.   Psychiatric:         Mood and Affect: Mood normal.             BG/insulin  reviewed per EMR.   Glucose, POC   Date Value   07/24/2024 101 mg/dL   87/81/7974 871 mg/dL   87/82/7974 871 mg/dL   87/82/7974 801 mg/dL (H)   87/82/7974 852 mg/dL   87/83/7974 846 mg/dL   87/83/7974 797 mg/dL (H)   87/83/7974 99 mg/dL   89/76/7986 784 MG/DL (H)   90/74/7986 55 MG/DL (L)   90/74/7986 52 MG/DL (L)   93/78/7986 96 MG/DL   93/78/7986 844 MG/DL   93/78/7986 877 MG/DL   93/79/7986 768 MG/DL (H)   93/79/7986 91 MG/DL        Summary of labs:  Lab Results Component Value Date    A1C 6.8 (H) 07/03/2024    A1C 6.2 (H) 01/07/2024    A1C 6.0 (H) 09/10/2023     Lab Results   Component Value Date    GFR 5.11 (L) 05/01/2012    CREATININE 1.75 (H) 07/24/2024  Lab Results   Component Value Date    WBC 9.3 07/24/2024    HGB 12.2 (L) 07/24/2024    HCT 37.5 (L) 07/24/2024    PLT 202 07/24/2024       Lab Results   Component Value Date    NA 144 07/24/2024    K 5.1 (H) 07/24/2024    CL 109 (H) 07/24/2024    CO2 18.9 (L) 07/24/2024    BUN 32 (H) 07/24/2024    CREATININE 1.75 (H) 07/24/2024    GLU 120 07/24/2024    CALCIUM 10.0 07/24/2024    MG 2.0 07/14/2024    PHOS 2.9 07/19/2024       Lab Results   Component Value Date    BILITOT 0.2 (L) 07/03/2024    BILIDIR 0.20 12/10/2023    PROT 7.2 07/03/2024    ALBUMIN 2.9 (L) 07/19/2024    ALT 17 07/03/2024    AST 20 07/03/2024    ALKPHOS 78 07/03/2024    GGT 20 06/21/2019                   [1]    [Provider Hold] amlodipine   10 mg Oral Daily    atorvastatin   40 mg Oral Daily    dexAMETHasone   2 mg Oral Daily    Followed by    NOREEN ON 07/27/2024] dexAMETHasone   1 mg Oral Daily    famotidine   20 mg Oral Daily    heparin  (porcine) for subcutaneous use  5,000 Units Subcutaneous Q8H SCH    insulin  lispro  4 Units Subcutaneous 3xd Meals    insulin  lispro  0-20 Units Subcutaneous ACHS    insulin  NPH  5 Units Subcutaneous Q12H SCH    [START ON 07/30/2024] predniSONE   5 mg Oral Daily    senna  2 tablet Oral Nightly    tacrolimus   7 mg Oral Daily    valsartan   160 mg Oral Daily   [2]   Past Medical History:  Diagnosis Date    Anemia     Cancer    (CMS-HCC)     clear cell adenocarcinoma of left kidney    Diabetes mellitus (CMS-HCC)     Diabetic nephropathy (CMS-HCC)     ESRD (end stage renal disease) on dialysis    (CMS-HCC)     ESRD on dialysis (CMS-HCC)     Gout     Hyperlipidemia     Hypertension    [3]   Past Surgical History:  Procedure Laterality Date    AV FISTULA PLACEMENT      PR COLONOSCOPY FLX DX W/COLLJ SPEC WHEN PFRMD N/A 04/30/2018    Procedure: COLONOSCOPY, FLEXIBLE, PROXIMAL TO SPLENIC FLEXURE; DIAGNOSTIC, W/WO COLLECTION SPECIMEN BY BRUSH OR WASH;  Surgeon: Dorn Lynwood Lauth, MD;  Location: [facility]ED[facility];  Service: Gastroenterology    PR COLONOSCOPY W/BIOPSY SINGLE/MULTIPLE N/A 04/30/2018    Procedure: COLONOSCOPY, FLEXIBLE, PROXIMAL TO SPLENIC FLEXURE; WITH BIOPSY, SINGLE OR MULTIPLE;  Surgeon: Dorn Lynwood Lauth, MD;  Location: [facility];  Service: Gastroenterology    PR COLSC FLX W/RMVL OF TUMOR POLYP LESION SNARE TQ N/A 04/30/2018    Procedure: COLONOSCOPY FLEX; W/REMOV TUMOR/LES BY SNARE;  Surgeon: Dorn Lynwood Lauth, MD;  Location: [facility];  Service: Gastroenterology    PR EXCIS SUPRATENT BRAIN TUMOR Left 07/11/2024    Procedure: Left craniotomy for resection of mass;  Surgeon: Zettie Rosaria HERO, MD;  Location: OR [facility];  Service: Neurosurgery  PR EXPLORATORY OF ABDOMEN Midline 04/14/2016    Procedure: EXPLORATORY LAPAROTOMY, EXPLORATORY CELIOTOMY WITH OR WITHOUT BIOPSY(S);  Surgeon: Marsa Sam Boss, MD;  Location: [facility]IN OR [facility];  Service: Transplant    PR EXPLORATORY OF ABDOMEN N/A 04/16/2016    Procedure: EXPLORATORY LAPAROTOMY, EXPLORATORY CELIOTOMY WITH OR WITHOUT BIOPSY(S);  Surgeon: Marsa Sam Boss, MD;  Location: MAIN OR [facility];  Service: Transplant    PR IONM 1 ON 1 IN OR W/ATTENDANCE EACH 15 MINUTES N/A 07/11/2024    Procedure: CONTINUOUS INTRAOPERATIVE NEUROPHYSIOLOGY MONITORING IN OR;  Surgeon: Zettie Rosaria HERO, MD;  Location: OR [facility];  Service: Neurosurgery    PR LAP, RADICAL NEPHRECTOMY Right 12/27/2016    Procedure: Robotic Xi Laparoscopy; Radical Nephrectomy (Incl Remove Gerota`S Fascia, Fatty Tissue, Reg Lymph Node, Adrenalectomy);  Surgeon: Clorinda Ole Chalk, MD;  Location: MAIN OR [facility];  Service: Urology    PR LIGATN ANGIOACCESS AV FISTULA Left 11/29/2022    Procedure: LIGATION OR BANDING OF ANGIOACCESS ARTERIOVENOUS FISTULA; Surgeon: [place], Marsa Sam, MD;  Location: MAIN OR [facility];  Service: Transplant    PR MICROSURG TECHNIQUES,REQ OPER MICROSCOPE N/A 07/11/2024    Procedure: MICROSURGICAL TECHNIQUES, REQUIRING USE OF OPERATING MICROSCOPE (LIST SEPARATELY IN ADDITION TO CODE FOR PRIMARY PROCEDURE);  Surgeon: Zettie Rosaria HERO, MD;  Location: OR [facility];  Service: Neurosurgery    PR NEGATIVE PRESSURE WOUND THERAPY DME >50 SQ CM N/A 04/12/2016    Procedure: NEG PRESS WOUND TX (VAC ASSIST) INCL TOPICALS, PER SESSION, TSA GREATER THAN/= 50 CM SQUARED;  Surgeon: Marsa Sam Boss, MD;  Location: MAIN OR [place];  Service: Transplant    PR NEGATIVE PRESSURE WOUND THERAPY DME >50 SQ CM Bilateral 04/14/2016    Procedure: NEG PRESS WOUND TX (VAC ASSIST) INCL TOPICALS, PER SESSION, TSA GREATER THAN/= 50 CM SQUARED;  Surgeon: Marsa Sam Boss, MD;  Location: MAIN OR ;  Service: Transplant    PR NEGATIVE PRESSURE WOUND THERAPY DME >50 SQ CM N/A 04/16/2016    Procedure: NEG PRESS WOUND TX (VAC ASSIST) INCL TOPICALS, PER SESSION, TSA GREATER THAN/= 50 CM SQUARED;  Surgeon: Marsa Sam Boss, MD;  [facility]: [facility] OR [facility];  Service: Transplant    PR NEPHRECTOMY, W/PART. URETECTOMY Bilateral 04/11/2016    Procedure: LAPAROSCOPY, SURGICAL, NEPHRECTOMY WITH TOTAL URETERECTOMY;  Surgeon: Marsa Sam Boss, MD;  Location: MAIN OR [facility];  Service: Transplant    PR REMV KIDNEY,W/RIB RESECTION Bilateral 04/11/2016    Procedure: NEPHRECTOMY, INCLUDING PARTIAL URETERECTOMY, ANY OPEN APPROACH INCLUDING RIB RESECTION;  Surgeon: Marsa Sam Boss, MD;  Location: MAIN OR [facility];  Service: Transplant    PR STEREOTACTIC COMP ASSIST PROC,CRANIAL,INTRADURAL N/A 07/11/2024    Procedure: STEREOTACTIC COMPUTER-ASSISTED (NAVIGATIONAL) PROCEDURE; CRANIAL, INTRADURAL;  Surgeon: Zettie Rosaria HERO, MD;  Location: OR [facility];  Service: Neurosurgery    PR TRANSPLANT,PREP LIVING  RENAL GRAFT N/A 06/22/2019    Procedure: [facility] STD PREP LIVING DONOR RENAL ALLGRFT (OPEN/LAPROSC) PRIOR TO TRANSPLANT, INC DISSECT/REM AS NECESS;  Surgeon: Marsa Sam Boss, MD;  Location: MAIN OR [facility];  Service: Transplant    PR TRANSPLANTATION OF KIDNEY N/A 06/22/2019    Procedure: RENAL ALLOTRANSPLANTATION, IMPLANTATION OF GRAFT; WITHOUT RECIPIENT NEPHRECTOMY;  Surgeon: Marsa Sam Boss, MD;  Location: MAIN OR [facility];  Service: Transplant    SPLENECTOMY     [4]   Family History  Problem Relation Age of Onset    Kidney disease Mother     Cancer Father     Kidney disease Brother    [5]   Social History  Tobacco Use  Smoking status: Never    Smokeless tobacco: Never   Vaping Use    Vaping status: Never Used   Substance Use Topics    Alcohol use: No    Drug use: No

## 2024-07-24 NOTE — Progress Notes (Signed)
 Physical Medicine and Rehabilitation  Daily Progress Note [facility]    ASSESSMENT:     Frank Leach is a 73 y.o. male admitted to [facility] for glioblastoma status post resection.    Rehab Impairment Group Code [facility]):  (Brain Dysfunction) 02.1 Non-Traumatic   Etiology: Glioblastoma    PLAN:     This patient is admitted to the Physical Medicine and Rehabilitation - Inpatient - D service from 8am-5pm on weekdays for questions regarding this patient. After hours, weekends, and holidays please contact the 1st Call resident pager       REHAB:   - PT and OT to maximize functional status with mobility and ADLs as well as prevention of joint contracture.   - SLP for cognitive and swallow function.  - Neuropsych for higher level cognitive evaluation and coping.  - RT for community re-integration, education, and leisure support services.  - To be discussed in weekly Interdisciplinary Team Conference.     Glioblastoma   Procedure date: 07/11/24; Dr. Zettie  Presented with two weeks of gait imbalance and right sided weakness. Found to have a frontoparietal lesion now status post resection with neurosurgery. Completed post-operative Keppra  prophylaxis during acute admission. Pathology consistent with glioblastoma. Neuro-oncology and radiation oncology consulted during acute admission and plan for concurrent radiation and temozolomide  chemotherapy. Currently on a steroid taper.   - dexamethasone  2mg  Q12 hours (12/14 - 12/17)  Followed by 2mg  daily x 3 days (12/18 - 12/20)  Followed by 1mg  daily x 3 days (12/21 - 12/23), then stop  - famotidine  20mg  daily   - Tylenol  650mg  Q6hrs PRN  [ ]  staple removal; POD 14 ~12/19  [ ]  neurosurgery follow up  [x]  radiation oncology follow up; plan for RT to start 12/30 (outpatient)  [x]  neuro-oncology follow up; plan to start temozolomide  (new start) at the time of radiation therapy; pending MAP approval for financial assistance     HTN  - HOLD amlodipine  10mg  daily   - atorvastatin  40mg  daily   - valsartan  160mg  daily      S/p DDKT  Baseline creatining 1.7-1.9; stable. Off mycophenolate  in the setting of malignancy as above. Continued on tacrolimus .  - tacrolimus  7mg  daily   - tacrolimus  trough; goal level 4-6 ng/mL  - after completion of dexamethasone  as above, start prednisone  5mg  daily on 12/24 (ordered)  [x]  nephrology consult      T2DM  - NPH 8 units Q12 hrs  - Lispro 6 units TID w/meals  - SSI     Daily Checklist  - Diet: regular  - DVT PPX: SQ heparin   - GI PPX: famotidine      DISPO: Patient to be discussed at weekly interdisciplinary team conference.   - EDD: TBD.   - Follow-up: PCP, PM&R, neurosurgery, neuro oncology, radiation oncology    SUBJECTIVE and PROGRESS WITH FUNCTIONAL ACTIVITIES:     Interval Events:   NAEO. BG 128-198. VSS. Labs stable. UOP 1.5L. LBM 12/17. No complaints this morning.     OBJECTIVE:     Vital signs (last 24 hours):  Temp:  [36.5 ??C (97.7 ??F)-36.8 ??C (98.2 ??F)] 36.5 ??C (97.7 ??F)  Pulse:  [47-58] 57  Resp:  [17-18] 17  BP: (122-143)/(65-78) 143/70  MAP (mmHg):  [83-97] 91  SpO2:  [100 %] 100 %    Intake/Output (last 3 shifts):  I/O last 3 completed shifts:  In: 1150 [P.O.:1150]  Out: 2150 [Urine:2150]    Physical Exam:   GEN: sitting comfortably in wheelchair,  NAD  HEENT: sclera anicteric, conjunctiva clear, MMM, trachea midline  CV: warm extremities, well perfused, no peripheral edema  RESP:  NWOB on RA  ABD: not distended  SKIN: scalp staples in place, no erythema  MSK: no notable contractures or joint swelling  NEURO: awake and alert, responds appropriately to questions   PSYCH: calm, euthymic mood, lifted affect    Medications:  Scheduled Scheduled meds with Route[1]  PRN acetaminophen , 650 mg, Q6H PRN  bisacodyl, 10 mg, Daily PRN  calcium carbonate, 200 mg elem calcium, TID PRN  dextrose  in water , 12.5 g, Q15 Min PRN  guaiFENesin, 100 mg, Q4H PRN  melatonin, 3 mg, Nightly PRN  ondansetron , 4 mg, Q6H PRN        Labs/Studies: Reviewed.    Radiology Results: Reviewed    Quality Indicators      Hearing, Speech, and Vision  Ability to Hear: Adequate  Ability to See in Adequate Light: Adequate  Expression of Ideas and Wants: Without difficulty  Understanding Verbal and Non-Verbal Content: Understands    Cognitive Pattern Assessment  Cognitive Pattern Assessment Used: BIMS  Brief Interview for Mental Status (BIMS)  Repetition of Three Words (First Attempt): 3  Temporal Orientation: Year: Correct  Temporal Orientation: Month: Accurate within 5 days  Temporal Orientation: Day: Correct  Recall: Sock: Yes, no cue required  Recall: Blue: Yes, no cue required  Recall: Bed: Yes, no cue required  BIMS Summary Score: 15       Nutritional Approaches  Nutritional Approach: None of the above    ADLs  Admission Current   Eating Assistance Needed: Set-up / clean-up    CARE Score - 5 Assistance Needed: Set-up / clean-up    CARE Score - 5   Oral Hygiene  Assistance Needed: Physical assistance 25% or less  CARE Score - 3  Assistance Needed: Physical assistance 25% or less  CARE Score - 3   Bladder Continence       Bowel Continence       Toileting Hygiene Assistance Needed: Physical assistance 26%-50%  CARE Score - 3 Assistance Needed: Physical assistance 26%-50%  CARE Score - 3    Toilet Transfer Assistance Needed: Physical assistance 25% or less  CARE Score - 3 Assistance Needed: Physical assistance 25% or less  CARE Score - 3   Shower/Bathe Self Assistance Needed: Physical assistance 25% or less  CARE Score - 3  Assistance Needed: Physical assistance 25% or less  CARE Score - 3   Upper Body Dressing Assistance Needed: Physical assistance 26%-50%  CARE Score - 3  Assistance Needed: Physical assistance 26%-50%  CARE Score - 3   Lower Body Dressing Assistance Needed: Physical assistance 26%-50%  CARE Score - 3  Assistance Needed: Physical assistance 26%-50%  CARE Score - 3   On/Off Footwear Assistance Needed: Physical assistance 26%-50%  CARE Score - 3  Assistance Needed: Physical assistance 26%-50%  CARE Score - 3         Transfers Admission Current   Bed to Chair      CARE Score - 88       CARE Score - 88     Lying to Sitting      CARE Score - 88       CARE Score - 88   Roll Left/Right      CARE Score - 88       CARE Score - 88   Sit to Lying      CARE Score -  88       CARE Score - 88   Sit to Stand      CARE Score - 88       CARE Score - 88         Mobility Admission Current   Walk 10 Feet      CARE Score - 88      CARE Score - 88     Walk 50 Feet 2 Turns      CARE Score - 88      CARE Score - 88   Walk 150 Feet      CARE Score - 88       CARE Score - 88   Walk 10 Feet Uneven      CARE Score - 88       CARE Score - 88   1 Step (Curb)      CARE Score - 88       CARE Score - 88   4 Steps      CARE Score - 88       CARE Score - 88   12 Steps      CARE Score - 88       CARE Score - 88   Picking Up Object      CARE Score - 88       CARE Score - 88   Wheelchair/Scooter Use        Wheel 50 Feet 2 Turns        CARE Score - Wheel 50 Feet with Two Turns: 88    Type of Wheelchair/Scooter: Manual        CARE Score - Wheel 50 Feet with Two Turns: 88    Type of Wheelchair/Scooter: Manual   Wheel 150 Feet        CARE Score - Wheel 150 Feet: 88    Type of Wheelchair/Scooter: Manual         CARE Score - Wheel 150 Feet: 88    Type of Wheelchair/Scooter: Manual              [1]    [Provider Hold] amlodipine  (NORVASC ) tablet 10 mg Daily    atorvastatin  (LIPITOR ) tablet 40 mg Daily    dexAMETHasone  (DECADRON ) tablet 2 mg Daily    famotidine  (PEPCID ) tablet 20 mg Daily    heparin  (porcine) 5,000 unit/mL injection 5,000 Units Q8H SCH    insulin  lispro (HumaLOG ) inj PERCENTAGE MEAL EATEN 4 Units 3xd Meals    insulin  lispro (HumaLOG ) injection CORRECTIONAL 0-20 Units ACHS    insulin  NPH (HumuLIN ,NovoLIN ) injection 5 Units Q12H SCH    senna (SENOKOT) tablet 2 tablet Nightly    tacrolimus  (ENVARSUS  XR) extended release tablet 7 mg Daily    valsartan  (DIOVAN ) tablet 160 mg Daily

## 2024-07-24 NOTE — Consults (Signed)
 Recreational Therapy Evaluation  07/24/2024    Patient Name:  Frank Leach       Medical Record Number: [id number]   Date of Birth: Sep 25, 1950  Sex: Male          Room/Bed:  3H204/3H204-01    Session Duration  Individual [mins]: 20 Min.    Assessment  Pt is a 73 y.o. male with PMH of renal transplant who presented with gait imbalance and right sided weakness, found to have frontoparietal mass status post resection. Pathology consistent with glioblastoma. He is admitted to [facility] for comprehensive interdisciplinary rehabilitation. Pt seen this PM to complete initial RT evaluation discussing pt's motivators, support system, and leisure interests. Pt identified no difficulty coping and identifies as having a strong support system. LRT discussed resources and ways to advocate for continued needs, and will follow up with pt to provide resources before his discharge date scheduled, TBD. No formal RT POC needed at this time as PT/OT addressing primary Rehab needs. Pt agreeable for RT follow-up to include supportive community resources/supports and leisure opportunities. LRT also available should other needs arise.    Plan of Care  D/C Services Weekly Frequency: D/C Services     Motivators: Able to Identify motivators  Patient's Motivators: Family, being active  Patient's Identified Treatment Goal: I want to improve my walking, so I'm not a burden on my wife.  Patient's Stressors / Triggers: Pt stated, I am tired of staying at the hospital. I'd go home today if I could.  Treatment Plan developed in collaboration with: Patient, Treatment Team (No family present.)  Interventions: Adjustment to hospitalization, Health Care Encounters and Medical Condition, Community reintegration, Coping skills, Discharge planning, Education - Family / caregiver, Education - Patient, Leisure, Wellness / recovery     Interventions: Adjustment to hospitalization, Health Care Encounters and Medical Condition, Community reintegration, Pharmacologist, Discharge planning, Education - Family / caregiver, Education - Patient, Leisure, Wellness / recovery     Subjective    Current Situation: Pr received in his room sitting in recliner upon LRT arrival.  Cognitive, Emotional, Physical, Social, and Leisure/Life functioning were assessed:: Patient Interviews, Review of Chart, Treatment Team, No family present  Employment: Retired  Economist: [employer]  Lives With: Spouse  Precautions: [place] Insurance Underwriter / Environment: Patient not wearing mask for full session, Caregiver wearing mask for full session  Reports/displays signs/symptoms of pain?: No  Add'l Session Information: RN aware of RT tx session    Past Medical History[1] Social History     Tobacco Use    Smoking status: Never    Smokeless tobacco: Never   Substance Use Topics    Alcohol use: No      Past Surgical History[2] Family History[3]     Allergies: Patient has no known allergies.     Objective    Cognitive  Stage of change / level of insight: Contemplation  Thought Process/Content: Organized  Attention Span/Alertness : Able to attend to RT assessment  Medical understanding: Would benefit from additional dx/tx education'  Orientation: Fully oriented  Additional Cognitive Domain Comment: Per ST, 'Pt self-assesses cognition to be at baseline, which was confirmed by pt's wife who was present at bedside. Pt presents with fluent and intelligible speech in conversation, without evidence of dysarthria or word retrieval deficits.'    Communication  Communication Barriers: None noted    Emotional  Mood: Pleasant, Hopeful  Affect: Congruent  Anxiety Management : Reports no anxiety (Pt stated, Sometimes I worry about  my family. I don't want to burden my wife.)  Pain Management : Reports no pain  Coping Skills : Reports independent practice of healthy coping strategies  Motivated to learn new coping strategies?: Indifferent (Pt stated, I am a thinker. I don't think too far into the future, but I like to think about my next move.)  Adjustment: Willing to learn ways to compensate  Body Image/Self concept: No concern with body image  Emotional Expression: Independently expresses feelings  Additional Emotional Domain comments: Pt stated, I am doing good. I am not depressed at all. I always stay positive.    Physical Domain  Vision: Wears glasses all the time  Upper extremity function: Please refer to OT eval for details.  Lower extremity function: Please refer to PT eval for details. Pt stated, It feels like my body has a mind of it's own. Like I had a stroke and my Right side is weak.    Social  Support system: Reports positive support system  Patient Behaviors and Interactions: Appropriate  Ability to form relationship / interact with others: Able to form social relationships independently  Additional Social Domain Comments: Pt lives in [place], [place] [facility] with his spouse. Pt also has supprtive daughter, grandchildren, and family.     Leisure and Life Function  Level of involvement: No current participation  Firefighter / barrier education: Unable to return to community at this time  Motivation to engage in leisure / play: Yes  Quality of participation: Satisfactory involvement in healthy leisure pursuits  Additional Leisure and Life Function Comments: Pt enjoys leisure activities of walking 2 miles a day in his neighborhood, attending church, listening to music, and spending time with grandchildren.    I attest that I have reviewed the above information.  Signed by Sid ONEIDA Dawson, LRT/CTRS   Filed 07/24/2024         [1]   Past Medical History:  Diagnosis Date    Anemia     Cancer    (CMS-HCC)     clear cell adenocarcinoma of left kidney    Diabetes mellitus (CMS-HCC)     Diabetic nephropathy (CMS-HCC)     ESRD (end stage renal disease) on dialysis    (CMS-HCC)     ESRD on dialysis (CMS-HCC)     Gout     Hyperlipidemia     Hypertension    [2]   Past Surgical History:  Procedure Laterality Date    AV FISTULA PLACEMENT      PR COLONOSCOPY FLX DX W/COLLJ SPEC WHEN PFRMD N/A 04/30/2018    Procedure: COLONOSCOPY, FLEXIBLE, PROXIMAL TO SPLENIC FLEXURE; DIAGNOSTIC, W/WO COLLECTION SPECIMEN BY BRUSH OR WASH;  Surgeon: Dorn Lynwood Lauth, MD;  [facility]: [facility];  Service: Gastroenterology    PR COLONOSCOPY W/BIOPSY SINGLE/MULTIPLE N/A 04/30/2018    Procedure: COLONOSCOPY, FLEXIBLE, PROXIMAL TO SPLENIC FLEXURE; WITH BIOPSY, SINGLE OR MULTIPLE;  Surgeon: Dorn Lynwood Lauth, MD;  Location: [facility];  Service: Gastroenterology    PR COLSC FLX W/RMVL OF TUMOR POLYP LESION SNARE TQ N/A 04/30/2018    Procedure: COLONOSCOPY FLEX; W/REMOV TUMOR/LES BY SNARE;  Surgeon: Dorn Lynwood Lauth, MD;  Location: [facility];  Service: Gastroenterology    PR EXCIS SUPRATENT BRAIN TUMOR Left 07/11/2024    Procedure: Left craniotomy for resection of mass;  Surgeon: Zettie Rosaria HERO, MD;  Location: OR [facility];  Service: Neurosurgery    PR EXPLORATORY OF ABDOMEN Midline 04/14/2016  Procedure: EXPLORATORY LAPAROTOMY, EXPLORATORY CELIOTOMY WITH OR WITHOUT BIOPSY(S);  Surgeon: Marsa Sam Boss, MD;  Location: MAIN OR [facility];  Service: Transplant    PR EXPLORATORY OF ABDOMEN N/A 04/16/2016    Procedure: EXPLORATORY LAPAROTOMY, EXPLORATORY CELIOTOMY WITH OR WITHOUT BIOPSY(S);  Surgeon: Marsa Sam Boss, MD;  Location: MAIN OR [facility];  Service: Transplant    PR IONM 1 ON 1 IN OR W/ATTENDANCE EACH 15 MINUTES N/A 07/11/2024    Procedure: CONTINUOUS INTRAOPERATIVE NEUROPHYSIOLOGY MONITORING IN OR;  Surgeon: Zettie Rosaria HERO, MD;  Location: OR [facility];  Service: Neurosurgery    PR LAP, RADICAL NEPHRECTOMY Right 12/27/2016    Procedure: Robotic Xi Laparoscopy; Radical Nephrectomy (Incl Remove Gerota`S Fascia, Fatty Tissue, Reg Lymph Node, Adrenalectomy);  Surgeon: Clorinda Ole Chalk, MD;  Location: MAIN OR [facility];  Service: Urology    PR LIGATN ANGIOACCESS AV FISTULA Left 11/29/2022    Procedure: LIGATION OR BANDING OF ANGIOACCESS ARTERIOVENOUS FISTULA;  Surgeon: Toledo, Marsa Sam, MD;  Location: MAIN OR [facility];  Service: Transplant    PR MICROSURG TECHNIQUES,REQ OPER MICROSCOPE N/A 07/11/2024    Procedure: MICROSURGICAL TECHNIQUES, REQUIRING USE OF OPERATING MICROSCOPE (LIST SEPARATELY IN ADDITION TO CODE FOR PRIMARY PROCEDURE);  Surgeon: Zettie Rosaria HERO, MD;  Location: OR [facility];  Service: Neurosurgery    PR NEGATIVE PRESSURE WOUND THERAPY DME >50 SQ CM N/A 04/12/2016    Procedure: NEG PRESS WOUND TX (VAC ASSIST) INCL TOPICALS, PER SESSION, TSA GREATER THAN/= 50 CM SQUARED;  Surgeon: Marsa Sam Boss, MD;  Location: MAIN OR [place];  Service: Transplant    PR NEGATIVE PRESSURE WOUND THERAPY DME >50 SQ CM Bilateral 04/14/2016    Procedure: NEG PRESS WOUND TX (VAC ASSIST) INCL TOPICALS, PER SESSION, TSA GREATER THAN/= 50 CM SQUARED;  Surgeon: Marsa Sam Boss, MD;  Location: MAIN OR [place];  Service: Transplant    PR NEGATIVE PRESSURE WOUND THERAPY DME >50 SQ CM N/A 04/16/2016    Procedure: NEG PRESS WOUND TX (VAC ASSIST) INCL TOPICALS, PER SESSION, TSA GREATER THAN/= 50 CM SQUARED;  Surgeon: Marsa Sam Boss, MD;  Location: MAIN OR [facility];  Service: Transplant    PR NEPHRECTOMY, W/PART. URETECTOMY Bilateral 04/11/2016    Procedure: LAPAROSCOPY, SURGICAL, NEPHRECTOMY WITH TOTAL URETERECTOMY;  Surgeon: Marsa Sam Boss, MD;  Location: [facility];  Service: Transplant    PR REMV KIDNEY,W/RIB RESECTION Bilateral 04/11/2016    Procedure: NEPHRECTOMY, INCLUDING PARTIAL URETERECTOMY, ANY OPEN APPROACH INCLUDING RIB RESECTION;  Surgeon: Marsa Sam Boss, MD;  Location: MAIN OR [facility];  Service: Transplant    PR STEREOTACTIC COMP ASSIST PROC,CRANIAL,INTRADURAL N/A 07/11/2024    Procedure: STEREOTACTIC COMPUTER-ASSISTED (NAVIGATIONAL) PROCEDURE; CRANIAL, INTRADURAL;  Surgeon: Zettie Rosaria HERO, MD;  Location: OR [facility]; Service: Neurosurgery    PR TRANSPLANT,PREP LIVING  RENAL GRAFT N/A 06/22/2019    Procedure: [facility] STD PREP LIVING DONOR RENAL ALLGRFT (OPEN/LAPROSC) PRIOR TO TRANSPLANT, INC DISSECT/REM AS NECESS;  Surgeon: Marsa Sam Boss, MD;  Location: MAIN OR [facility];  Service: Transplant    PR TRANSPLANTATION OF KIDNEY N/A 06/22/2019    Procedure: RENAL ALLOTRANSPLANTATION, IMPLANTATION OF GRAFT; WITHOUT RECIPIENT NEPHRECTOMY;  Surgeon: Marsa Sam Boss, MD;  Location: MAIN OR [facility];  Service: Transplant    SPLENECTOMY     [3]   Family History  Problem Relation Age of Onset    Kidney disease Mother     Cancer Father     Kidney disease Brother

## 2024-07-24 NOTE — Consults (Signed)
 Transplant Nephrology Consult     Requesting Attending Physician :  Deward Adjutant, MD  Service Requesting Consult : [facility])  Reason for Consult: kidney transplant recipient, assistance with immunosuppression management    Assessment and Plan:     # S/p Kidney Transplant 2020 for HTN nephropathy, nephrectomies for RCC. Kidney allograft function (stable):  - Serum creatinine remains within baseline of approximately 1.7-1.9.   - Recommend ~2x/weekly BMP to ensure stability of Cr and K, as we have been adjusting tacrolimus   - - Transplant patients with an open wound require wound care with sterile water  only. The patient should be counseled on this at the time of discharge if they have not already been doing this.    # Immunosuppression:  - Off mycophenolate  in setting of malignancy   - Envarsus  increased to 7 mg daily (12/15) in setting of lower tac troughs  - Please obtain trough tacrolimus  trough levels prior to the morning dose of the medication and approximately 24hrs after prior dose. The goal tacrolimus  level is 4-6 ng/mL.  - Once the patient has completed his course of dexamethasone , would start prednisone  5 mg daily.    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 Do not expect nephrotoxic effect with this chemo regimen.     RECOMMENDATIONS:   - Continue envarsus  7 mg daily, will use Friday's trough to determine need for further dose adjustments    - We will continue to follow.     Elyn JINNY Favorite, MD  07/24/2024 8:23 AM     Medical decision-making for 07/24/24  Findings / Data Patient has: []  acute illness w/systemic sxs  [mod]  []  two or more stable chronic illnesses [mod]  []  one chronic illness with acute exacerbation [mod]  []  acute complicated illness  [mod]  []  Undiagnosed new problem with uncertain prognosis  [mod] [x]  illness posing risk to life or bodily function (ex. AKI)  [high]  []  chronic illness with severe exacerbation/progression  [high]  []  chronic illness with severe side effects of treatment  [high] Kidney transplant recipient, brain mass Probs At least 2:  Probs, Data, Risk   I reviewed: [x]  primary team note  []  consultant note(s)  []  external records [x]  chemistry results  [x]  CBC results  []  blood gas results  []  Other []  procedure/op note(s)   []  radiology report(s)  []  micro result(s)  []  w/ independent historian(s) Cr at baseline, reviewed primary team notes, low tacrolimus  levels  >=3 Data Review (2 of 3)    I independently interpreted: []  Urine Sediment  []  Renal US  []  CXR Images  []  CT Images  []  Other []  EKG Tracing  Any     I discussed: []  Pathology results w/ QHPs(s) from other specialties  []  Procedural findings w/ QHPs(s) from other specialties []  Imaging w/ QHP(s) from other specialties  [x]  Treatment plan w/ QHP(s) from other specialties Plan discussed with primary team Any     Mgm't requires: []  Prescription drug(s)  [mod]  []  Kidney biopsy  [  mod]  []  Central line placement  [mod] [x]  High risk medication use and/or intensive toxicity monitoring [high]  []  Renal replacement therapy [high]  []  High risk kidney biopsy  [high]  []  Escalation of care  [high]  []  High risk central line placement  [high] Immunosuppression: high risk for infection Risk      _____________________________________________________________________________________    Interval:  22 h envarsus  level 3.3 yesterday. Last adjusted on 12/15. He feels well and has no complaints. Hoping to get home before christmas. Therapies going well.     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     INPATIENT MEDICATIONS:  Current Medications[1]    Physical Exam:   Vitals:    07/23/24 1700 07/24/24 0530 07/24/24 0645 07/24/24 0813   BP: 122/65 143/70  110/67   Pulse: 57 (!) 47 57    Resp: 18 17     Temp: 36.5 ??C (97.7 ??F) 36.5 ??C (97.7 ??F)     TempSrc: Oral Oral     SpO2: 100% 100%     Weight:       Height:         No intake/output data recorded.    Intake/Output Summary (Last 24 hours) at 07/24/2024 9176  Last data filed at 07/24/2024 0530  Gross per 24 hour   Intake 900 ml   Output 1550 ml   Net -650 ml         Constitutional: no acute distress, up chair, staples in scalp  Heart: regular rate and rhythm  Lungs: normal WOB, on room air, CTAB   Ext: no lower extremity edema    Neuro: awake, alert                   [1]   Current Facility-Administered Medications:     acetaminophen  (TYLENOL ) tablet 650 mg, Oral, Q6H PRN    [Provider Hold] amlodipine  (NORVASC ) tablet 10 mg, Oral, Daily    atorvastatin  (LIPITOR ) tablet 40 mg, Oral, Daily    bisacodyl (DULCOLAX) suppository 10 mg, Rectal, Daily PRN    calcium carbonate (TUMS) chewable tablet 200 mg elem calcium, Oral, TID PRN    [COMPLETED] dexAMETHasone  (DECADRON ) tablet 2 mg, Oral, Q12H SCH **FOLLOWED BY** dexAMETHasone  (DECADRON ) tablet 2 mg, Oral, Daily **FOLLOWED BY** [START ON 07/27/2024] dexAMETHasone  (DECADRON ) tablet 1 mg, Oral, Daily    dextrose  50 % in water  (D50W) 50 % solution 12.5 g, Intravenous, Q15 Min PRN    famotidine  (PEPCID ) tablet 20 mg, Oral, Daily    guaiFENesin (ROBITUSSIN) oral syrup, Oral, Q4H PRN    heparin  (porcine) 5,000 unit/mL injection 5,000 Units, Subcutaneous, Q8H SCH    insulin  lispro (HumaLOG ) inj PERCENTAGE MEAL EATEN 4 Units, Subcutaneous, 3xd Meals    insulin  lispro (HumaLOG ) injection CORRECTIONAL 0-20 Units, Subcutaneous, ACHS    insulin  NPH (HumuLIN ,NovoLIN ) injection 5 Units, Subcutaneous, Q12H SCH    melatonin tablet 3 mg, Oral, Nightly PRN    ondansetron  (ZOFRAN -ODT) disintegrating tablet 4  mg, Oral, Q6H PRN    [START ON 07/30/2024] predniSONE  (DELTASONE ) tablet 5 mg, Oral, Daily    senna (SENOKOT) tablet 2 tablet, Oral, Nightly    tacrolimus  (ENVARSUS  XR) extended release tablet 7 mg, Oral, Daily    valsartan  (DIOVAN ) tablet 160 mg, Oral, Daily

## 2024-07-24 NOTE — Consults (Signed)
 Adult Nutrition Assessment Note    Visit Type: RN Consult  Reason for Visit: Per Admission Nutrition Screen (Adult)    NUTRITION INTERVENTIONS and RECOMMENDATION     Weigh weekly    NUTRITION ASSESSMENT     Current nutrition therapy is appropriate and likely meeting nutritional needs at this time.   Patient endorses good appetite and PO intake; 100% intakes noted    NUTRITIONALLY RELEVANT DATA     HPI & PMH:   73 y.o. male with PMH of renal transplant who presented with gait imbalance and right sided weakness, found to have frontoparietal mass status post resection. Pathology consistent with glioblastoma. He is now admitted to [facility] for comprehensive interdisciplinary rehabilitation.     Nutrition History:   12/18- Patient endorses good appetite and intake prior to admission over the past year    Medications:  Nutritionally pertinent medications reviewed and evaluated for potential food and/or medication interactions.  Humulin , Senna, Prednisone , Decadron , Pepcid     Labs:   Nutritionally pertinent labs reviewed and include K+: 5.1 mmol/L    Nutritional Needs:   Healthy balance of carbohydrate, protein, and fat.     Anthropometric Data:  Height: 170.2 cm (5' 7)   Admission weight: 70.2 kg (154 lb 12.2 oz)  Last recorded weight: 70.2 kg (154 lb 12.2 oz) (07/22/24)  IBW: 67.2 kg  BMI: Body mass index is 24.24 kg/m??.   Usual Body Weight: 150 lb per patient report  Weight Assessment: stable weight per patient report and overall noted per chart review  Wt Readings from Last 20 Encounters:   07/22/24 70.2 kg (154 lb 12.2 oz)   07/13/24 73 kg (160 lb 15 oz)   07/03/24 72.6 kg (160 lb)   01/18/24 73.2 kg (161 lb 6.4 oz)   06/08/23 68.9 kg (151 lb 12.8 oz)   03/22/23 68.3 kg (150 lb 8 oz)   02/14/23 68.4 kg (150 lb 12.8 oz)   12/21/22 68 kg (149 lb 14.4 oz)   11/27/22 67.6 kg (149 lb)   11/02/22 68.1 kg (150 lb 3.2 oz)   10/13/22 68.2 kg (150 lb 6.4 oz)   06/02/22 67 kg (147 lb 9.6 oz)   02/08/22 67.4 kg (148 lb 9.6 oz) 02/08/22 67.4 kg (148 lb 9.6 oz)   01/13/22 68.1 kg (150 lb 3.2 oz)   09/28/21 70.3 kg (155 lb)   05/27/21 74.1 kg (163 lb 6.4 oz)   01/26/21 73.9 kg (163 lb)   10/20/20 75.8 kg (167 lb)   10/20/20 75.8 kg (167 lb)       Malnutrition Assessment:  Malnutrition Assessment using AND/ASPEN or GLIM Clinical Characteristics:    Patient does not meet AND/ASPEN criteria for malnutrition at this time (07/24/24 1526)               Nutrition Focused Physical Exam:  Nutrition Focused Physical Exam:                        Nutrition Evaluation  Overall Impressions: Nutrition-Focused Physical Exam not indicated due to lack of malnutrition risk factors. (07/24/24 1526)     Care plan:  Patient does not meet malnutrition criteria     Current Nutrition:  Oral intake   Nutrition Orders            Nutrition Therapy Regular/House starting at 12/16 1226          Nutritionally Pertinent Allergies, Intolerances, Sensitivities, and/or Cultural/Religious Restrictions:  none identified  per chart review at this time     GOALS and EVALUATION     Patient to meet 75% or greater of nutritional needs via combination of meals, snacks, and/or oral supplements within admission.  - Met/No longer indicated    Motivation, Barriers, and Compliance:  Evaluation of motivation, barriers, and compliance completed. No concerns identified at this time.     Discharge Planning:   Monitor for potential discharge needs with multi-disciplinary team.         Follow-Up Parameters:   Signing off at this time (Please reconsult if needed)    Arleta Blanch, RD

## 2024-07-24 NOTE — Plan of Care (Signed)
 Physical Medicine and Rehabilitation  Individualized Overall Plan of Care  07/24/2024 7:30 AM     Patient Name: Frank Leach   Medical Record Number: [id number]   Date of Birth: 1951-04-10   Sex: Male   Room/Bed: 3H204/3H204-01     Primary Impairment Group:       Admit Date/Time: 07/22/2024 11:50 AM     Physician Summary:   Bernett [name] Comes will undergo inpatient rehabilitation to manage complex medical and rehabilitation needs related to  PMH of renal transplant who presented with gait imbalance and right sided weakness, found to have frontoparietal mass status post resection. Pathology consistent with glioblastoma.         The patient will receive interdisciplinary care, including daily physician management for the following:   Pain, Monitoring for medication side effects, and Nutrition     The patient will benefit from continued services by:   Therapy Type Min/Day Days/Week   Physical therapy 1-2 hours/day 5-7   Occupational therapy 1-2 hours/day 5-7   Speech Therapy D/C Services D/C Services     The patient will benefit from additional services by:  Recreational Therapy    Rehab nursing is required to help manage the patient???s complex nursing needs related to their documented medical conditions.     The patient's medical prognosis is Fair to achieve the stated goals below and to be able to be discharged home with family assistance or supervision within the estimated length of stay of  12 days.     Patient and Family Goals     ADL: To learn how to walk all over again.  Mobility: To go home  Community Reintegration:       Quality Indicators - Goals     Eating Discharge Goal  Discharge Goal: Independent     Oral Hygiene Discharge Goal  Discharge Goal: Independent    Toileting Hygiene Discharge Goal  Discharge Goal: Independent    Toilet Transfer Discharge Goal  Discharge Goal: Independent    Shower/Bathe Self Discharge Goal  Discharge Goal: Independent    Upper Body Dressing Discharge Goal  Discharge Goal: Independent    Lower Body Dressing Discharge Goal  Discharge Goal: Independent    Putting On/Taking Off Footwear Discharge Goal  Discharge Goal: Independent    Roll Left and Right Discharge Goal  Discharge Goal: Independent    Sit to Lying Discharge Goal  Discharge Goal: Independent    Lying to Sitting on Side of Bed Discharge Goal  Discharge Goal: Independent    Sit to Stand Discharge Goal  Discharge Goal: Supervision or touching assistance    Chair/Bed-to-Chair Transfer Discharge Goal  Discharge Goal: Supervision or touching assistance    Car Transfer Discharge Goal  Discharge Goal: Supervision or touching assistance    Walk 10 Feet Discharge Goal  Discharge Goal: Supervision or touching assistance    Walk 50 Feet with Two Turns Discharge Goal  Discharge Goal: Supervision or touching assistance    Walk 150 Feet Discharge Goal  Discharge Goal: Supervision or touching assistance    Walking 10 Feet on Uneven Surfaces Discharge Goal  Discharge Goal: Supervision or touching assistance    1 Step (Curb) Discharge Goal  Discharge Goal: Supervision or touching assistance    4 Steps Discharge Goal  Discharge Goal: Supervision or touching assistance    12 Steps Discharge Goal  Discharge Goal: Not applicable    Picking Up Object Discharge Goal  Discharge Goal: Supervision or touching assistance    Wheel 50  Feet with Two Turns Discharge Goal  Discharge Goal: Independent    Wheel 150 Feet Discharge Goal  Discharge Goal: Independent

## 2024-07-25 LAB — TACROLIMUS LEVEL, TROUGH: TACROLIMUS, TROUGH: 3.5 ng/mL — ABNORMAL LOW (ref 5.0–15.0)

## 2024-07-25 MED ADMIN — insulin NPH (HumuLIN,NovoLIN) injection 5 Units: 5 [IU] | SUBCUTANEOUS | @ 14:00:00

## 2024-07-25 MED ADMIN — melatonin tablet 3 mg: 3 mg | ORAL | @ 02:00:00

## 2024-07-25 MED ADMIN — heparin (porcine) 5,000 unit/mL injection 5,000 Units: 5000 [IU] | SUBCUTANEOUS | @ 02:00:00

## 2024-07-25 MED ADMIN — heparin (porcine) 5,000 unit/mL injection 5,000 Units: 5000 [IU] | SUBCUTANEOUS | @ 12:00:00

## 2024-07-25 MED ADMIN — heparin (porcine) 5,000 unit/mL injection 5,000 Units: 5000 [IU] | SUBCUTANEOUS | @ 20:00:00

## 2024-07-25 MED ADMIN — atorvastatin (LIPITOR) tablet 40 mg: 40 mg | ORAL | @ 14:00:00

## 2024-07-25 MED ADMIN — insulin lispro (HumaLOG) injection CORRECTIONAL 0-20 Units: 0-20 [IU] | SUBCUTANEOUS | @ 03:00:00

## 2024-07-25 MED ADMIN — tacrolimus (ENVARSUS XR) extended release tablet 7 mg: 7 mg | ORAL | @ 14:00:00 | Stop: 2024-07-25

## 2024-07-25 MED ADMIN — famotidine (PEPCID) tablet 20 mg: 20 mg | ORAL | @ 14:00:00

## 2024-07-25 MED ADMIN — valsartan (DIOVAN) tablet 160 mg: 160 mg | ORAL | @ 14:00:00

## 2024-07-25 MED ADMIN — insulin lispro (HumaLOG) inj PERCENTAGE MEAL EATEN 3 Units: 3 [IU] | SUBCUTANEOUS

## 2024-07-25 MED ADMIN — dexAMETHasone (DECADRON) tablet 2 mg: 2 mg | ORAL | @ 14:00:00 | Stop: 2024-07-27

## 2024-07-25 MED ADMIN — insulin lispro (HumaLOG) inj PERCENTAGE MEAL EATEN 4 Units: 4 [IU] | SUBCUTANEOUS | @ 17:00:00 | Stop: 2024-07-25

## 2024-07-25 MED ADMIN — insulin lispro (HumaLOG) inj PERCENTAGE MEAL EATEN 4 Units: 4 [IU] | SUBCUTANEOUS | @ 13:00:00 | Stop: 2024-07-25

## 2024-07-25 MED ADMIN — senna (SENOKOT) tablet 2 tablet: 2 | ORAL | @ 02:00:00

## 2024-07-25 NOTE — Discharge Instr - Other Info (Addendum)
 Discharge Services and Resources:    Home Health Services:  Home Health has been arranged with [facility] at [phone] .    Start of Care for Home Health will be within 48 hours of discharge. If you have not been contacted by the Home Health agency prior to your start of care, please call the above number regarding services arranged for you.          Home Medical Equipment: Wheelchair and a BSC was ordered for you.  You have a 1 year warranty. Should you have questions or need any service on your DME, please call your company listed below.      [employer]  [phone]        If you have any questions regarding your medical equipment after discharge, please call the above number.

## 2024-07-25 NOTE — Plan of Care (Signed)
 Shift Summary  Insulin  NPH was administered in the morning to manage blood glucose.   Continent of bladder and bowel. Had BM today.  Skin integrity was maintained with frequent repositioning. Surgical incision still with staples, open to air.   Safety interventions, including fall reduction and use of non-skid footwear, were consistently implemented throughout the shift.   Patient tolerated all interventions well and reported no pain, with overall status stable and no new-onset illness or injury documented.     Absence of New-Onset Illness or Injury: No new injuries or illnesses were documented during the shift; skin integrity was maintained with appropriate interventions for a surgical incision, and pain was consistently denied. Frequent repositioning, absorbent pads, and incontinence pads were utilized, and safety interventions were maintained throughout the day.     Improved Ability to Complete Activities of Daily Living: Progress was made in transfers and ambulation with supervision and assistance, and self-care activities such as bathing and dressing were completed with varying levels of support. Patient tolerated therapeutic and occupational interventions well and demonstrated improved gait pattern and right foot clearance with facilitation.     Optimal Coping: Patient agreed to participate in therapy and reported feeling good, though some nervousness was expressed earlier in the day.

## 2024-07-25 NOTE — Consults (Signed)
 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 Kidney allograft function (stable):  - Serum creatinine remains within baseline of approximately 1.7-1.9.   - Recommend ~2x/weekly BMP to ensure stability of Cr and K, as we have been adjusting tacrolimus   - - Transplant patients with an open wound require wound care with sterile water  only. The patient should be counseled on this at the time of discharge if they have not already been doing this.    # Immunosuppression:  - Off mycophenolate  in setting of malignancy   - Envarsus  last increased to 7 mg on 12/15. 23-h trough today remains below goal at 3.5. Increase envarsus  to 8 mg daily today   - Please obtain trough tacrolimus  trough levels prior to the morning dose of the medication and approximately 24hrs after prior dose. The goal tacrolimus  level is 4-6 ng/mL.  - Once the patient has completed his course of dexamethasone , would start prednisone  5 mg daily.    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 Do not expect nephrotoxic effect with this chemo regimen.     RECOMMENDATIONS:   - Increase envarsus  to 8 mg daily     - We will continue to follow.     Elyn JINNY Favorite, MD  07/25/2024 8:27 AM     Medical decision-making for 07/25/24  Findings / Data     Patient has: []  acute illness w/systemic sxs  [mod]  []  two or more stable chronic illnesses [mod]  []  one chronic illness with acute exacerbation [mod]  []  acute complicated illness  [mod]  []  Undiagnosed new problem with uncertain prognosis  [mod] [x]  illness posing risk to life or bodily function (ex. AKI)  [high]  []  chronic illness with severe exacerbation/progression  [high]  []  chronic illness with severe side effects of treatment  [high] Kidney transplant recipient, brain mass Probs At least 2:  Probs, Data, Risk   I reviewed: [x]  primary team note  []  consultant note(s)  []  external records [x]  chemistry results  [x]  CBC results  []  blood gas results  []  Other []  procedure/op note(s)   []  radiology report(s)  []  micro result(s)  []  w/ independent historian(s) Cr at baseline, reviewed primary team notes, low tacrolimus  levels  >=3 Data Review (2 of 3)    I independently interpreted: []  Urine Sediment  []  Renal US  []  CXR Images  []  CT Images  []  Other []  EKG Tracing  Any     I discussed: []  Pathology results w/ QHPs(s) from other specialties  []  Procedural findings w/ QHPs(s) from other specialties []  Imaging w/ QHP(s) from other specialties  [x]  Treatment plan w/ QHP(s) from other specialties Plan discussed with primary team, pharmacist  Any     Mgm't requires: []   Prescription drug(s)  [mod]  []  Kidney biopsy  [mod]  []  Central line placement  [mod] [x]  High risk medication use and/or intensive toxicity monitoring [high]  []  Renal replacement therapy [high]  []  High risk kidney biopsy  [high]  []  Escalation of care  [high]  []  High risk central line placement  [high] Immunosuppression: high risk for infection Risk      _____________________________________________________________________________________    Interval:  Doing well, working with PT this afternoon. 23h envarsus  level below goal at 3.5, last adjusted on 12/15.    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     INPATIENT MEDICATIONS:  Current Medications[1]    Physical Exam:   Vitals:    07/24/24 0645 07/24/24 0813 07/24/24 1615 07/25/24 0541   BP:  110/67 113/70 150/61   Pulse: 57  57 50   Resp:   19 20   Temp:   36.6 ??C (97.9 ??F) 36.6 ??C (97.9 ??F)   TempSrc:   Oral Oral   SpO2:   100% 100%   Weight:       Height:         No intake/output data recorded.    Intake/Output Summary (Last 24 hours) at 07/25/2024 0827  Last data filed at 07/25/2024 0530  Gross per 24 hour   Intake 200 ml   Output 300 ml   Net -100 ml         Constitutional: no acute distress, up out of bed, staples in scalp  Heart: ext wwp   Lungs: normal WOB, on room air  Ext: no lower extremity edema    Neuro: awake, alert                   [1]   Current Facility-Administered Medications:     acetaminophen  (TYLENOL ) tablet 650 mg, Oral, Q6H PRN    [Provider Hold] amlodipine  (NORVASC ) tablet 10 mg, Oral, Daily    atorvastatin  (LIPITOR ) tablet 40 mg, Oral, Daily    bisacodyl (DULCOLAX) suppository 10 mg, Rectal, Daily PRN    calcium carbonate (TUMS) chewable tablet 200 mg elem calcium, Oral, TID PRN    [COMPLETED] dexAMETHasone  (DECADRON ) tablet 2 mg, Oral, Q12H SCH **FOLLOWED BY** dexAMETHasone  (DECADRON ) tablet 2 mg, Oral, Daily **FOLLOWED BY** [START ON 07/27/2024] dexAMETHasone  (DECADRON ) tablet 1 mg, Oral, Daily    dextrose  50 % in water  (D50W) 50 % solution 12.5 g, Intravenous, Q15 Min PRN    famotidine  (PEPCID ) tablet 20 mg, Oral, Daily    guaiFENesin (ROBITUSSIN) oral syrup, Oral, Q4H PRN    heparin  (porcine) 5,000 unit/mL injection 5,000 Units, Subcutaneous, Q8H SCH    insulin  lispro (HumaLOG ) inj PERCENTAGE MEAL EATEN 4 Units, Subcutaneous, 3xd Meals    insulin  lispro (HumaLOG ) injection CORRECTIONAL 0-20 Units, Subcutaneous, ACHS    insulin  NPH (HumuLIN ,NovoLIN ) injection 5 Units, Subcutaneous, Daily    melatonin tablet 3 mg, Oral, Nightly PRN    ondansetron  (ZOFRAN -ODT) disintegrating tablet 4 mg, Oral, Q6H  PRN    [START ON 07/30/2024] predniSONE  (DELTASONE ) tablet 5 mg, Oral, Daily    senna (SENOKOT) tablet 2 tablet, Oral, Nightly    tacrolimus  (ENVARSUS  XR) extended release tablet 7 mg, Oral, Daily    valsartan  (DIOVAN ) tablet 160 mg, Oral, Daily

## 2024-07-25 NOTE — Progress Notes (Signed)
 Physical Medicine and Rehabilitation  Daily Progress Note [facility]    ASSESSMENT:     Frank Leach is a 73 y.o. male admitted to [facility] for glioblastoma status post resection.    Rehab Impairment Group Code [facility]):  (Brain Dysfunction) 02.1 Non-Traumatic   Etiology: Glioblastoma    PLAN:     This patient is admitted to the Physical Medicine and Rehabilitation - Inpatient - D service from 8am-5pm on weekdays for questions regarding this patient. After hours, weekends, and holidays please contact the 1st Call resident pager       REHAB:   - PT and OT to maximize functional status with mobility and ADLs as well as prevention of joint contracture.   - SLP for cognitive and swallow function.  - Neuropsych for higher level cognitive evaluation and coping.  - RT for community re-integration, education, and leisure support services.  - To be discussed in weekly Interdisciplinary Team Conference.     Glioblastoma   Procedure date: 07/11/24; Dr. Zettie  Presented with two weeks of gait imbalance and right sided weakness. Found to have a frontoparietal lesion now status post resection with neurosurgery. Completed post-operative Keppra  prophylaxis during acute admission. Pathology consistent with glioblastoma. Neuro-oncology and radiation oncology consulted during acute admission and plan for concurrent radiation and temozolomide  chemotherapy. Currently on a steroid taper.   - dexamethasone  2mg  Q12 hours (12/14 - 12/17)  Followed by 2mg  daily x 3 days (12/18 - 12/20)  Followed by 1mg  daily x 3 days (12/21 - 12/23), then stop  - famotidine  20mg  daily   - Tylenol  650mg  Q6hrs PRN  [ ]  staple removal; POD 14 ~12/19  [ ]  neurosurgery follow up  [x]  radiation oncology follow up; plan for RT to start 12/30 (outpatient)  [x]  neuro-oncology follow up; plan to start temozolomide  (new start) at the time of radiation therapy; pending MAP approval for financial assistance     HTN  - HOLD amlodipine  10mg  daily   - atorvastatin  40mg  daily   - valsartan  160mg  daily      S/p DDKT  Baseline creatining 1.7-1.9; stable. Off mycophenolate  in the setting of malignancy as above. Continued on tacrolimus .  - tacrolimus  8mg  daily   - tacrolimus  trough; goal level 4-6 ng/mL  - after completion of dexamethasone  as above, start prednisone  5mg  daily on 12/24 (ordered)  [x]  nephrology consult      T2DM  - NPH 8 units Q12 hrs  - Lispro 6 units TID w/meals  - SSI    DME  - Wheelchair: The patient has mobility limitations that interfere with performing ADLs and MADLs (a wheelchair will improve this), mobility limitations that cannot be corrected with a cane or walker. Patient has expressed a willingness to use a wheelchair and has adequate space in their living environment for a wheelchair. Patient has a caregiver who is willing and available to assist patient w/ wheelchair transport.       Daily Checklist  - Diet: regular  - DVT PPX: SQ heparin   - GI PPX: famotidine      DISPO: Patient to be discussed at weekly interdisciplinary team conference.   - EDD: 07/31/24  - Follow-up: PCP, PM&R, neurosurgery, neuro oncology, radiation oncology    SUBJECTIVE and PROGRESS WITH FUNCTIONAL ACTIVITIES:     Interval Events:   NAEO. BG 101-201. Voiding spontaneously. LBM 12/18. Blood pressure slightly elevated today, will continue to monitor before restarting home amlodipine .     Discussed during interdisciplinary team conference and set  estimated discharge day as above.    OBJECTIVE:     Vital signs (last 24 hours):  Temp:  [36.6 ??C (97.9 ??F)] 36.6 ??C (97.9 ??F)  Pulse:  [50-57] 50  Resp:  [19-20] 20  BP: (110-150)/(61-70) 150/61  MAP (mmHg):  [81-88] 88  SpO2:  [100 %] 100 %    Intake/Output (last 3 shifts):  I/O last 3 completed shifts:  In: 200 [P.O.:200]  Out: 850 [Urine:850]    Physical Exam:   GEN: sitting up in bed, NAD  HEENT: sclera anicteric, conjunctiva clear, MMM, trachea midline, post surgical appearance with staples in place  CV: warm extremities, well perfused, no peripheral edema  RESP:  NWOB on RA  ABD: not distended  SKIN: scalp staples in place, no erythema  MSK: no notable contractures or joint swelling  NEURO: awake and alert, responds appropriately to questions   PSYCH: mood euthymic, affect appropriate    Medications:  Scheduled Scheduled meds with Route[1]  PRN acetaminophen , 650 mg, Q6H PRN  bisacodyl, 10 mg, Daily PRN  calcium carbonate, 200 mg elem calcium, TID PRN  dextrose  in water , 12.5 g, Q15 Min PRN  guaiFENesin, 100 mg, Q4H PRN  melatonin, 3 mg, Nightly PRN  ondansetron , 4 mg, Q6H PRN        Labs/Studies: Reviewed.    Radiology Results: Reviewed    Quality Indicators      Hearing, Speech, and Vision  Ability to Hear: Adequate  Ability to See in Adequate Light: Adequate  Expression of Ideas and Wants: Without difficulty  Understanding Verbal and Non-Verbal Content: Understands    Cognitive Pattern Assessment  Cognitive Pattern Assessment Used: BIMS  Brief Interview for Mental Status (BIMS)  Repetition of Three Words (First Attempt): 3  Temporal Orientation: Year: Correct  Temporal Orientation: Month: Accurate within 5 days  Temporal Orientation: Day: Correct  Recall: Sock: Yes, no cue required  Recall: Blue: Yes, no cue required  Recall: Bed: Yes, no cue required  BIMS Summary Score: 15       Nutritional Approaches  Nutritional Approach: None of the above    ADLs  Admission Current   Eating Assistance Needed: Set-up / clean-up    CARE Score - 5 Assistance Needed: Set-up / clean-up    CARE Score - 5   Oral Hygiene  Assistance Needed: Physical assistance 25% or less  CARE Score - 3  Assistance Needed: Physical assistance 25% or less  CARE Score - 3   Bladder Continence       Bowel Continence       Toileting Hygiene Assistance Needed: Physical assistance 26%-50%  CARE Score - 3 Assistance Needed: Physical assistance 26%-50%  CARE Score - 3    Toilet Transfer Assistance Needed: Physical assistance 25% or less  CARE Score - 3 Assistance Needed: Physical assistance 25% or less  CARE Score - 3   Shower/Bathe Self Assistance Needed: Physical assistance 25% or less  CARE Score - 3  Assistance Needed: Physical assistance 25% or less  CARE Score - 3   Upper Body Dressing Assistance Needed: Physical assistance 26%-50%  CARE Score - 3  Assistance Needed: Physical assistance 26%-50%  CARE Score - 3   Lower Body Dressing Assistance Needed: Physical assistance 26%-50%  CARE Score - 3  Assistance Needed: Physical assistance 26%-50%  CARE Score - 3   On/Off Footwear Assistance Needed: Physical assistance 26%-50%  CARE Score - 3  Assistance Needed: Physical assistance 26%-50%  CARE Score - 3  Transfers Admission Current   Bed to Chair      CARE Score - 88       CARE Score - 88     Lying to Sitting      CARE Score - 88       CARE Score - 88   Roll Left/Right      CARE Score - 88       CARE Score - 88   Sit to Lying      CARE Score - 88       CARE Score - 88   Sit to Stand      CARE Score - 88       CARE Score - 88         Mobility Admission Current   Walk 10 Feet      CARE Score - 88      CARE Score - 88     Walk 50 Feet 2 Turns      CARE Score - 88      CARE Score - 88   Walk 150 Feet      CARE Score - 88       CARE Score - 88   Walk 10 Feet Uneven      CARE Score - 88       CARE Score - 88   1 Step (Curb)      CARE Score - 88       CARE Score - 88   4 Steps      CARE Score - 88       CARE Score - 88   12 Steps      CARE Score - 88       CARE Score - 88   Picking Up Object      CARE Score - 88       CARE Score - 88   Wheelchair/Scooter Use        Wheel 50 Feet 2 Turns Assistance Needed: Physical assistance Total assistance    CARE Score - Wheel 50 Feet with Two Turns: 1    Type of Wheelchair/Scooter: Manual Assistance Needed: Physical assistance Total assistance    CARE Score - Wheel 50 Feet with Two Turns: 1    Type of Wheelchair/Scooter: Manual   Wheel 150 Feet Assistance Needed: Physical assistance Total assistance    CARE Score - Wheel 150 Feet: 1    Type of Wheelchair/Scooter: Manual  Assistance Needed: Physical assistance Total assistance    CARE Score - Wheel 150 Feet: 1    Type of Wheelchair/Scooter: Manual                [1]    [Provider Hold] amlodipine  (NORVASC ) tablet 10 mg Daily    atorvastatin  (LIPITOR ) tablet 40 mg Daily    dexAMETHasone  (DECADRON ) tablet 2 mg Daily    famotidine  (PEPCID ) tablet 20 mg Daily    heparin  (porcine) 5,000 unit/mL injection 5,000 Units Q8H SCH    insulin  lispro (HumaLOG ) inj PERCENTAGE MEAL EATEN 4 Units 3xd Meals    insulin  lispro (HumaLOG ) injection CORRECTIONAL 0-20 Units ACHS    insulin  NPH (HumuLIN ,NovoLIN ) injection 5 Units Daily    senna (SENOKOT) tablet 2 tablet Nightly    tacrolimus  (ENVARSUS  XR) extended release tablet 7 mg Daily    valsartan  (DIOVAN ) tablet 160 mg Daily

## 2024-07-25 NOTE — Consults (Signed)
 Tacrolimus  Therapeutic Monitoring Pharmacy Note    Frank Leach is a 73 y.o. male continuing tacrolimus .     Indication: Kidney transplant     Date of Transplant: 06/22/2019      Prior Dosing Information: Envarsus  7 mg PO daily (inc 12/15)  FYI: Home regimen Envarsus  5 mg PO daily     Source(s) of information used to determine prior to admission dosing: Patient/Caregiver, Home Medication List, or Clinic Note    Goals:  Therapeutic Drug Levels  Tacrolimus  trough goal: 4-6 ng/mL    Additional Clinical Monitoring/Outcomes  Monitor renal function (SCr and urine output) and liver function (LFTs)  Monitor for signs/symptoms of adverse events (e.g., hyperglycemia, hyperkalemia, hypomagnesemia, hypertension, headache, tremor)    Previous Lab Results:  Tacrolimus , Trough   Date Value Ref Range Status   07/25/2024 3.5 (L) 5.0 - 15.0 ng/mL Final   07/23/2024 3.3 (L) 5.0 - 15.0 ng/mL Final   07/22/2024 2.9 (L) 5.0 - 15.0 ng/mL Final   07/21/2024 1.6 (L) 5.0 - 15.0 ng/mL Final   07/20/2024 2.6 (L) 5.0 - 15.0 ng/mL Final     Creatinine   Date Value Ref Range Status   07/24/2024 1.75 (H) 0.73 - 1.18 mg/dL Final   87/86/7974 8.35 (H) 0.73 - 1.18 mg/dL Final   87/88/7974 8.27 (H) 0.73 - 1.18 mg/dL Final        Result: Tacrolimus  trough was 3.3 ng/mL (drawn appropriately)    Pharmacokinetic Considerations and Significant Drug Interactions:  Concurrent CYP3A4 substrates/inhibitors: amlodipine     Assessment/Plan:  Recommendation(s)  Today's tacrolimus  level is subtherapeutic   Inc Envarsus  today at 8 mg PO daily     Follow-up  MWF levels ordered at 7am.   A pharmacist will continue to monitor and recommend levels as appropriate    Please page service pharmacist with questions/clarifications.    Frank Leach, PharmD, BCPS

## 2024-07-25 NOTE — Consults (Signed)
 Endocrine Team Diabetes Follow Up Consult Note     Consult information:  Requesting Attending Physician : Deward Adjutant, MD  Service Requesting Consult : Physical Medicine and Rehabilitation [facility])  Primary Care Provider: Kandis Stefano [place], MD  Impression:  Frank Leach is a 73 y.o. male admitted for glioblastoma s/p resection. We have been consulted at the request of Deward Adjutant, MD to evaluate Frank Leach for hyperglycemia.     Medical Decision Making:  Diagnoses:  1.Type 2 Diabetes. Uncontrolled With glucoses below inpatient target but without hypoglycemia.  2. Nutrition: Complicating glycemic control. Increasing risk for both hypoglycemia and hyperglycemia.  3. Steroids. Complicating glycemic control and increasing risk for hyperglycemia.  4. Transplant. Complicating glycemic control and increasing risk for hyperglycemia.  5. Chronic Kidney Disease. Complicating glycemic control and increasing risk for hypoglycemia.      Studies reviewed 07/25/24:  Labs: POCT-BG  Interpretation: Intermittent hyperglycemia.  Notes reviewed: Primary team and nursing notes      Overall impression based on above reviews and history:  Pt with T2DM admitted to rehab s/p resection of glioblastoma.  Patient's Bgs mostly below goal range over the last 24 hours. Will further reduce nutritional insulin  today  Recommendations:  - NPH 5u in AM only  - lispro 3u TIDAC  - Lispro 1:30>140 ACHS  - Hypoglycemia protocol.  - POCT-BG achs.  - Ensure patient is on glucose precautions if patient taking nutrition by mouth.       Thank you for this consult. Discussed plan with primary team. We will continue to follow and make recommendations and place orders as appropriate.    Please page with questions or concerns: Ritchie Slater, [place]: [phone]  Endocrinology Diabetes Care Team on call from 6AM - 5PM on weekdays. Virtual assistance is available on the weekends from 6AM-1PM.      Subjective:    Interval History:  Patient doing well today. No acute events. Tolerating PO diet. No nausea or vomiting    Initial HPI:  Frank Leach is a 73 y.o. male with past medical history of ESRD s/p renal transplant 2020 with renal-cell carcinoma, DM, HTN who was admitted for glioblastoma s/p resection    Patient stable overnight. He is tolerating PO diet. No nausea or vomiting. Patient's steroid dose reduced by 50% today    Diabetes History:  Patient has a history of Type 2 diabetes diagnosed at least 15+ years ago.  Diabetes is managed by: PCP, nephrologist.  Current home diabetes regimen: metformin  XR 500mg  qAM,empagliflozin  25mg  qam.  - since admission to [facility], patient has been on insulin  inpatient while on steroid taper  Current home blood glucose monitoring:  occasionally.  Hypoglycemia awareness: yes.  Complications related to diabetes: ESRD s/p transplant in 2020      Current Nutrition:  Active Orders   Diet    Nutrition Therapy Regular/House       ROS: As per HPI.    Scheduled Medications[1]    Current Outpatient Medications   Medication Instructions    acetaminophen  (TYLENOL ) 500-1,000 mg, Oral, Every 6 hours PRN    allopurinol  (ZYLOPRIM ) 100 mg, Oral, Every evening    [Paused] amlodipine  (NORVASC ) 10 mg, Oral, Daily (standard)    atorvastatin  (LIPITOR ) 40 mg, Oral, Daily (standard)    dexAMETHasone  (DECADRON ) 2 mg, Oral, Every 12 hours    ENVARSUS  XR 7 mg, Oral, Daily, Take one tablet daily with ONE 4 mg tablet for a total daily dose of 5mg     famotidine  (PEPCID ) 20  mg, Oral, Daily (standard)    heparin  (porcine) 5,000 Units, Subcutaneous, Every 8 hours    latanoprost (XALATAN) 0.005 % ophthalmic solution 1 drop, Nightly    magnesium  oxide-Mg AA chelate (MAGNESIUM , AMINO ACID CHELATE,) 133 mg 1 tablet, Oral, 2 times a day    metFORMIN  (GLUCOPHAGE -XR) 500 mg, Oral, Daily (RT)    [Paused] olmesartan  (BENICAR ) 40 mg, Oral, Daily (standard)    ondansetron  (ZOFRAN ) 8 mg, Oral, Every 8 hours PRN, Take 1 tablet 1 hour before the chemotherapy (temozolomide ).    [Paused] predniSONE  (DELTASONE ) 5 mg, Oral, Daily (standard)    senna (SENOKOT) 8.6 mg tablet 2 tablets, Oral, Nightly    [Paused] temozolomide  (TEMODAR ) 75 mg/m2/day, Oral, Daily (standard)           Past Medical History[2]    Past Surgical History[3]    Family History[4]    Short Social History[5]    OBJECTIVE:  BP 117/57  - Pulse 50  - Temp 36.6 ??C (97.9 ??F) (Oral)  - Resp 20  - Ht 170.2 cm (5' 7)  - Wt 70.2 kg (154 lb 12.2 oz)  - SpO2 100%  - BMI 24.24 kg/m??   Wt Readings from Last 12 Encounters:   07/22/24 70.2 kg (154 lb 12.2 oz)   07/13/24 73 kg (160 lb 15 oz)   07/03/24 72.6 kg (160 lb)   01/18/24 73.2 kg (161 lb 6.4 oz)   06/08/23 68.9 kg (151 lb 12.8 oz)   03/22/23 68.3 kg (150 lb 8 oz)   02/14/23 68.4 kg (150 lb 12.8 oz)   12/21/22 68 kg (149 lb 14.4 oz)   11/27/22 67.6 kg (149 lb)   11/02/22 68.1 kg (150 lb 3.2 oz)   10/13/22 68.2 kg (150 lb 6.4 oz)   06/02/22 67 kg (147 lb 9.6 oz)     Physical Exam  Nursing note reviewed.   Constitutional:       General: He is not in acute distress.  HENT:      Head: Normocephalic and atraumatic.   Pulmonary:      Effort: Pulmonary effort is normal. No respiratory distress.   Skin:     General: Skin is warm.   Neurological:      Mental Status: He is alert and oriented to person, place, and time.             BG/insulin  reviewed per EMR.   Glucose, POC   Date Value   07/25/2024 112 mg/dL   87/80/7974 881 mg/dL   87/81/7974 798 mg/dL (H)   87/81/7974 889 mg/dL   87/81/7974 898 mg/dL   87/81/7974 871 mg/dL   87/82/7974 871 mg/dL   87/82/7974 801 mg/dL (H)   89/76/7986 784 MG/DL (H)   90/74/7986 55 MG/DL (L)   90/74/7986 52 MG/DL (L)   93/78/7986 96 MG/DL   93/78/7986 844 MG/DL   93/78/7986 877 MG/DL   93/79/7986 768 MG/DL (H)   93/79/7986 91 MG/DL        Summary of labs:  Lab Results   Component Value Date    A1C 6.8 (H) 07/03/2024    A1C 6.2 (H) 01/07/2024    A1C 6.0 (H) 09/10/2023     Lab Results   Component Value Date    GFR 5.11 (L) 05/01/2012 CREATININE 1.75 (H) 07/24/2024     Lab Results   Component Value Date    WBC 9.3 07/24/2024    HGB 12.2 (L) 07/24/2024    HCT 37.5 (L) 07/24/2024  PLT 202 07/24/2024       Lab Results   Component Value Date    NA 144 07/24/2024    K 5.1 (H) 07/24/2024    CL 109 (H) 07/24/2024    CO2 18.9 (L) 07/24/2024    BUN 32 (H) 07/24/2024    CREATININE 1.75 (H) 07/24/2024    GLU 120 07/24/2024    CALCIUM 10.0 07/24/2024    MG 2.0 07/14/2024    PHOS 2.9 07/19/2024       Lab Results   Component Value Date    BILITOT 0.2 (L) 07/03/2024    BILIDIR 0.20 12/10/2023    PROT 7.2 07/03/2024    ALBUMIN 2.9 (L) 07/19/2024    ALT 17 07/03/2024    AST 20 07/03/2024    ALKPHOS 78 07/03/2024    GGT 20 06/21/2019                     [1]    [Provider Hold] amlodipine   10 mg Oral Daily    atorvastatin   40 mg Oral Daily    dexAMETHasone   2 mg Oral Daily    Followed by    NOREEN ON 07/27/2024] dexAMETHasone   1 mg Oral Daily    famotidine   20 mg Oral Daily    heparin  (porcine) for subcutaneous use  5,000 Units Subcutaneous Q8H SCH    insulin  lispro  3 Units Subcutaneous 3xd Meals    insulin  lispro  0-20 Units Subcutaneous ACHS    insulin  NPH  5 Units Subcutaneous Daily    [START ON 07/30/2024] predniSONE   5 mg Oral Daily    senna  2 tablet Oral Nightly    tacrolimus   7 mg Oral Daily    valsartan   160 mg Oral Daily   [2]   Past Medical History:  Diagnosis Date    Anemia     Cancer    (CMS-HCC)     clear cell adenocarcinoma of left kidney    Diabetes mellitus (CMS-HCC)     Diabetic nephropathy (CMS-HCC)     ESRD (end stage renal disease) on dialysis    (CMS-HCC)     ESRD on dialysis (CMS-HCC)     Gout     Hyperlipidemia     Hypertension    [3]   Past Surgical History:  Procedure Laterality Date    AV FISTULA PLACEMENT      PR COLONOSCOPY FLX DX W/COLLJ SPEC WHEN PFRMD N/A 04/30/2018    Procedure: COLONOSCOPY, FLEXIBLE, PROXIMAL TO SPLENIC FLEXURE; DIAGNOSTIC, W/WO COLLECTION SPECIMEN BY BRUSH OR WASH;  Surgeon: Dorn Lynwood Lauth, MD;  [facility]: [facility]ED[facility];  Service: Gastroenterology    PR COLONOSCOPY W/BIOPSY SINGLE/MULTIPLE N/A 04/30/2018    Procedure: COLONOSCOPY, FLEXIBLE, PROXIMAL TO SPLENIC FLEXURE; WITH BIOPSY, SINGLE OR MULTIPLE;  Surgeon: Dorn Lynwood Lauth, MD;  Location: [facility] PROCEDURES [facility];  Service: Gastroenterology    PR COLSC FLX W/RMVL OF TUMOR POLYP LESION SNARE TQ N/A 04/30/2018    Procedure: COLONOSCOPY FLEX; W/REMOV TUMOR/LES BY SNARE;  Surgeon: Dorn Lynwood Lauth, MD;  Location: [facility];  Service: Gastroenterology    PR EXCIS SUPRATENT BRAIN TUMOR Left 07/11/2024    Procedure: Left craniotomy for resection of mass;  Surgeon: Zettie Rosaria HERO, MD;  Location: OR [facility];  Service: Neurosurgery    PR EXPLORATORY OF ABDOMEN Midline 04/14/2016    Procedure: EXPLORATORY LAPAROTOMY, EXPLORATORY CELIOTOMY WITH OR WITHOUT BIOPSY(S);  Surgeon: Marsa Sam Boss, MD;  Location:  MAIN OR [place];  Service: Transplant    PR EXPLORATORY OF ABDOMEN N/A 04/16/2016    Procedure: EXPLORATORY LAPAROTOMY, EXPLORATORY CELIOTOMY WITH OR WITHOUT BIOPSY(S);  Surgeon: Marsa Sam Boss, MD;  Location: MAIN OR [facility];  Service: Transplant    PR IONM 1 ON 1 IN OR W/ATTENDANCE EACH 15 MINUTES N/A 07/11/2024    Procedure: CONTINUOUS INTRAOPERATIVE NEUROPHYSIOLOGY MONITORING IN OR;  Surgeon: Zettie Rosaria HERO, MD;  Location: OR [facility];  Service: Neurosurgery    PR LAP, RADICAL NEPHRECTOMY Right 12/27/2016    Procedure: Robotic Xi Laparoscopy; Radical Nephrectomy (Incl Remove Gerota`S Fascia, Fatty Tissue, Reg Lymph Node, Adrenalectomy);  Surgeon: Clorinda Ole Chalk, MD;  Location: MAIN OR [facility];  Service: Urology    PR LIGATN ANGIOACCESS AV FISTULA Left 11/29/2022    Procedure: LIGATION OR BANDING OF ANGIOACCESS ARTERIOVENOUS FISTULA;  Surgeon: [place], Marsa Sam, MD;  Location: MAIN OR [facility];  Service: Transplant    PR MICROSURG TECHNIQUES,REQ OPER MICROSCOPE N/A 07/11/2024    Procedure: MICROSURGICAL TECHNIQUES, REQUIRING USE OF OPERATING MICROSCOPE (LIST SEPARATELY IN ADDITION TO CODE FOR PRIMARY PROCEDURE);  Surgeon: Zettie Rosaria HERO, MD;  Location: OR [facility];  Service: Neurosurgery    PR NEGATIVE PRESSURE WOUND THERAPY DME >50 SQ CM N/A 04/12/2016    Procedure: NEG PRESS WOUND TX (VAC ASSIST) INCL TOPICALS, PER SESSION, TSA GREATER THAN/= 50 CM SQUARED;  Surgeon: Marsa Sam Boss, MD;  Location: MAIN OR [place];  Service: Transplant    PR NEGATIVE PRESSURE WOUND THERAPY DME >50 SQ CM Bilateral 04/14/2016    Procedure: NEG PRESS WOUND TX (VAC ASSIST) INCL TOPICALS, PER SESSION, TSA GREATER THAN/= 50 CM SQUARED;  Surgeon: Marsa Sam Boss, MD;  Location: MAIN OR [place];  Service: Transplant    PR NEGATIVE PRESSURE WOUND THERAPY DME >50 SQ CM N/A 04/16/2016    Procedure: NEG PRESS WOUND TX (VAC ASSIST) INCL TOPICALS, PER SESSION, TSA GREATER THAN/= 50 CM SQUARED;  Surgeon: Marsa Sam Boss, MD;  Location: MAIN OR [facility];  Service: Transplant    PR NEPHRECTOMY, W/PART. URETECTOMY Bilateral 04/11/2016    Procedure: LAPAROSCOPY, SURGICAL, NEPHRECTOMY WITH TOTAL URETERECTOMY;  Surgeon: Marsa Sam Boss, MD;  Location: MAIN OR [facility];  Service: Transplant    PR REMV KIDNEY,W/RIB RESECTION Bilateral 04/11/2016    Procedure: NEPHRECTOMY, INCLUDING PARTIAL URETERECTOMY, ANY OPEN APPROACH INCLUDING RIB RESECTION;  Surgeon: Marsa Sam Boss, MD;  Location: MAIN OR [facility];  Service: Transplant    PR STEREOTACTIC COMP ASSIST PROC,CRANIAL,INTRADURAL N/A 07/11/2024    Procedure: STEREOTACTIC COMPUTER-ASSISTED (NAVIGATIONAL) PROCEDURE; CRANIAL, INTRADURAL;  Surgeon: Zettie Rosaria HERO, MD;  Location: OR [facility];  Service: Neurosurgery    PR TRANSPLANT,PREP LIVING  RENAL GRAFT N/A 06/22/2019    Procedure: [facility] STD PREP LIVING DONOR RENAL ALLGRFT (OPEN/LAPROSC) PRIOR TO TRANSPLANT, INC DISSECT/REM AS NECESS;  Surgeon: Marsa Sam Boss, MD;  Location: MAIN OR [facility];  Service: Transplant    PR TRANSPLANTATION OF KIDNEY N/A 06/22/2019    Procedure: RENAL ALLOTRANSPLANTATION, IMPLANTATION OF GRAFT; WITHOUT RECIPIENT NEPHRECTOMY;  Surgeon: Marsa Sam Boss, MD;  Location: MAIN OR [facility];  Service: Transplant    SPLENECTOMY     [4]   Family History  Problem Relation Age of Onset    Kidney disease Mother     Cancer Father     Kidney disease Brother    [5]   Social History  Tobacco Use    Smoking status: Never    Smokeless tobacco: Never   Vaping Use    Vaping status: Never Used  Substance Use Topics    Alcohol use: No    Drug use: No

## 2024-07-25 NOTE — Plan of Care (Signed)
 Shift Summary    Absence of Fall and Fall-Related Injury: Fall reduction program, bed safety, non-skid footwear, and use of safety devices for transfers were maintained, with hourly visual checks and scheduled toileting performed throughout the shift.    Effective Coping: Psychosocial status was within defined limits and melatonin was administered PRN for sleep.    Absence of Infection Signs and Symptoms: Aseptic technique and infection prevention measures, including personal protective equipment and environmental cleaning, were maintained throughout the shift.      Skin Health and Integrity: Surgical incision remained clean, dry and intact; frequent repositioning, pressure reduction devices, and skin protection interventions were consistently maintained to support skin integrity.    Improved Ability to Complete Activities of Daily Living: Cognition and right upper extremity motor response remained appropriate, and psychosocial status was within defined limits throughout the shift.

## 2024-07-26 MED ADMIN — insulin NPH (HumuLIN,NovoLIN) injection 5 Units: 5 [IU] | SUBCUTANEOUS | @ 13:00:00

## 2024-07-26 MED ADMIN — melatonin tablet 3 mg: 3 mg | ORAL | @ 01:00:00

## 2024-07-26 MED ADMIN — heparin (porcine) 5,000 unit/mL injection 5,000 Units: 5000 [IU] | SUBCUTANEOUS | @ 19:00:00

## 2024-07-26 MED ADMIN — heparin (porcine) 5,000 unit/mL injection 5,000 Units: 5000 [IU] | SUBCUTANEOUS | @ 10:00:00

## 2024-07-26 MED ADMIN — heparin (porcine) 5,000 unit/mL injection 5,000 Units: 5000 [IU] | SUBCUTANEOUS | @ 01:00:00

## 2024-07-26 MED ADMIN — atorvastatin (LIPITOR) tablet 40 mg: 40 mg | ORAL | @ 14:00:00

## 2024-07-26 MED ADMIN — famotidine (PEPCID) tablet 20 mg: 20 mg | ORAL | @ 13:00:00

## 2024-07-26 MED ADMIN — valsartan (DIOVAN) tablet 160 mg: 160 mg | ORAL | @ 13:00:00

## 2024-07-26 MED ADMIN — insulin lispro (HumaLOG) inj PERCENTAGE MEAL EATEN 3 Units: 3 [IU] | SUBCUTANEOUS | @ 19:00:00

## 2024-07-26 MED ADMIN — insulin lispro (HumaLOG) inj PERCENTAGE MEAL EATEN 3 Units: 3 [IU] | SUBCUTANEOUS | @ 23:00:00

## 2024-07-26 MED ADMIN — insulin lispro (HumaLOG) inj PERCENTAGE MEAL EATEN 3 Units: 3 [IU] | SUBCUTANEOUS | @ 13:00:00

## 2024-07-26 MED ADMIN — dexAMETHasone (DECADRON) tablet 2 mg: 2 mg | ORAL | @ 13:00:00 | Stop: 2024-07-26

## 2024-07-26 MED ADMIN — insulin lispro (HumaLOG) injection CORRECTIONAL 0-20 Units: 0-20 [IU] | SUBCUTANEOUS | @ 17:00:00

## 2024-07-26 MED ADMIN — tacrolimus (ENVARSUS XR) extended release tablet 8 mg: 8 mg | ORAL | @ 13:00:00

## 2024-07-26 NOTE — Plan of Care (Signed)
 Shift Summary  Melatonin was given PRN for sleep and senna was refused.   Skin integrity was closely managed with frequent repositioning, pressure reduction techniques, and supports.   Fall prevention interventions were consistently applied, including frequent toileting, safety devices, and visual checks.   Pain remained well controlled with no reported discomfort.   Overall, the shift was stable with no major complications or acute changes noted.     Absence of Infection Signs and Symptoms: Temperature remained within normal limits throughout the shift and no new abnormal findings were documented in the available data.     Optimal Wound Healing: Skin integrity interventions such as frequent weight shifts, heel elevation, and positioning supports were consistently implemented, and Braden score remained stable; however, exceptions to WDL and a skin abnormality were noted, with location referenced to LDA.     Absence of Fall and Fall-Related Injury: Fall prevention strategies including frequent toileting, use of safety devices, nonskid footwear, and hourly visual checks were maintained throughout the shift, with no fall events documented.     Optimal Pain Control and Function: Pain was assessed as 0 at the start of the shift and no further pain or functional limitations were documented.

## 2024-07-26 NOTE — Progress Notes (Signed)
 Physical Medicine and Rehabilitation  Daily Progress Note [facility]    ASSESSMENT:     Frank Leach is a 73 y.o. male admitted to [facility] for glioblastoma status post resection.    Rehab Impairment Group Code [facility]):  (Brain Dysfunction) 02.1 Non-Traumatic   Etiology: Glioblastoma    PLAN:     This patient is admitted to the Physical Medicine and Rehabilitation - Inpatient - D service from 8am-5pm on weekdays for questions regarding this patient. After hours, weekends, and holidays please contact the 1st Call resident pager       REHAB:   - PT and OT to maximize functional status with mobility and ADLs as well as prevention of joint contracture.   - SLP for cognitive and swallow function.  - Neuropsych for higher level cognitive evaluation and coping.  - RT for community re-integration, education, and leisure support services.  - To be discussed in weekly Interdisciplinary Team Conference.     Glioblastoma   Procedure date: 07/11/24; Dr. Zettie  Presented with two weeks of gait imbalance and right sided weakness. Found to have a frontoparietal lesion now status post resection with neurosurgery. Completed post-operative Keppra  prophylaxis during acute admission. Pathology consistent with glioblastoma. Neuro-oncology and radiation oncology consulted during acute admission and plan for concurrent radiation and temozolomide  chemotherapy. Currently on a steroid taper. Removed staples per neurosurgery on 12/19 without issue.   - dexamethasone  2mg  Q12 hours (12/14 - 12/17)  Followed by 2mg  daily x 3 days (12/18 - 12/20)  Followed by 1mg  daily x 3 days (12/21 - 12/23), then stop  - famotidine  20mg  daily   - Tylenol  650mg  Q6hrs PRN  [ ]  neurosurgery follow up  [x]  radiation oncology follow up; plan for RT to start 12/30 (outpatient)  [x]  neuro-oncology follow up; plan to start temozolomide  (new start) at the time of radiation therapy; pending MAP approval for financial assistance     HTN  - HOLD amlodipine  10mg  daily   - atorvastatin  40mg  daily   - valsartan  160mg  daily      S/p DDKT  Baseline creatining 1.7-1.9; stable. Off mycophenolate  in the setting of malignancy as above. Continued on tacrolimus .  - tacrolimus  8mg  daily   - tacrolimus  trough; goal level 4-6 ng/mL  - after completion of dexamethasone  as above, start prednisone  5mg  daily on 12/24 (ordered)  [x]  nephrology consult      T2DM  - NPH 8 units Q12 hrs  - Lispro 6 units TID w/meals  - SSI    DME  - Wheelchair: The patient has mobility limitations that interfere with performing ADLs and MADLs (a wheelchair will improve this), mobility limitations that cannot be corrected with a cane or walker. Patient has expressed a willingness to use a wheelchair and has adequate space in their living environment for a wheelchair. Patient has a caregiver who is willing and available to assist patient w/ wheelchair transport.       Daily Checklist  - Diet: regular  - DVT PPX: SQ heparin   - GI PPX: famotidine      DISPO: Patient to be discussed at weekly interdisciplinary team conference.   - EDD: 07/31/24  - Follow-up: PCP, PM&R, neurosurgery, neuro oncology, radiation oncology    SUBJECTIVE and PROGRESS WITH FUNCTIONAL ACTIVITIES:     Weekend Events:     12/20 - NAEO. Staples removed yesterday without issues. Patient seen this morning with no new questions or concerns. No changes made to plan for today.  OBJECTIVE:     Vital signs (last 24 hours):  Temp:  [36.5 ??C (97.7 ??F)-37 ??C (98.6 ??F)] 36.5 ??C (97.7 ??F)  Pulse:  [47-63] 47  Resp:  [18] 18  BP: (117-148)/(57-70) 148/68  MAP (mmHg):  [76-92] 92  SpO2:  [98 %-99 %] 98 %    Intake/Output (last 3 shifts):  I/O last 3 completed shifts:  In: 200 [P.O.:200]  Out: 300 [Urine:300]    Physical Exam:   GEN: Sitting up in bed, eating breakfast, NAD  HEENT: sclera anicteric, conjunctiva clear, MMM, trachea midline, post surgical appearance  CV: warm extremities, well perfused, no peripheral edema  RESP:  NWOB on RA  ABD: not distended  SKIN: scalp incision clean, dry, intact, no erythema or bleeding noted  MSK: no notable contractures or joint swelling  NEURO: awake and alert, responds appropriately to questions   PSYCH: positive mood, conversationally pleasant, affect lifted    Medications:  Scheduled Scheduled meds with Route[1]  PRN acetaminophen , 650 mg, Q6H PRN  bisacodyl, 10 mg, Daily PRN  calcium carbonate, 200 mg elem calcium, TID PRN  dextrose  in water , 12.5 g, Q15 Min PRN  guaiFENesin, 100 mg, Q4H PRN  melatonin, 3 mg, Nightly PRN  ondansetron , 4 mg, Q6H PRN        Labs/Studies: Reviewed.    Radiology Results: Reviewed    Quality Indicators      Hearing, Speech, and Vision  Ability to Hear: Adequate  Ability to See in Adequate Light: Adequate  Expression of Ideas and Wants: Without difficulty  Understanding Verbal and Non-Verbal Content: Understands    Cognitive Pattern Assessment  Cognitive Pattern Assessment Used: BIMS  Brief Interview for Mental Status (BIMS)  Repetition of Three Words (First Attempt): 3  Temporal Orientation: Year: Correct  Temporal Orientation: Month: Accurate within 5 days  Temporal Orientation: Day: Correct  Recall: Sock: Yes, no cue required  Recall: Blue: Yes, no cue required  Recall: Bed: Yes, no cue required  BIMS Summary Score: 15       Nutritional Approaches  Nutritional Approach: None of the above    ADLs  Admission Current   Eating Assistance Needed: Set-up / clean-up    CARE Score - 5 Assistance Needed: Set-up / clean-up    CARE Score - 5   Oral Hygiene  Assistance Needed: Physical assistance 25% or less  CARE Score - 3  Assistance Needed: Physical assistance    CARE Score - 3   Bladder Continence       Bowel Continence       Toileting Hygiene Assistance Needed: Physical assistance 26%-50%  CARE Score - 3 Assistance Needed: Physical assistance    CARE Score - 3    Toilet Transfer Assistance Needed: Physical assistance 25% or less  CARE Score - 3 Assistance Needed: Physical assistance    CARE Score - 3 Shower/Bathe Self Assistance Needed: Physical assistance 25% or less  CARE Score - 3  Assistance Needed: Incidental touching    CARE Score - 4   Upper Body Dressing Assistance Needed: Physical assistance 26%-50%  CARE Score - 3  Assistance Needed: Set-up / clean-up, Supervision    CARE Score - 4   Lower Body Dressing Assistance Needed: Physical assistance 26%-50%  CARE Score - 3  Assistance Needed: Physical assistance 25% or less  CARE Score - 3   On/Off Footwear Assistance Needed: Physical assistance 26%-50%  CARE Score - 3  Assistance Needed: Physical assistance 26%-50%  CARE Score - 3  Transfers Admission Current   Bed to Chair Assistance Needed: Incidental touching, Adaptive equipment    CARE Score - 88  Assistance Needed: Incidental touching, Adaptive equipment    CARE Score - 4     Lying to Sitting Assistance Needed: Supervision    CARE Score - 88  Assistance Needed: Supervision    CARE Score - 4   Roll Left/Right Assistance Needed: Supervision    CARE Score - 88  Assistance Needed: Supervision    CARE Score - 4   Sit to Lying      CARE Score - 88       CARE Score - 88   Sit to Stand Assistance Needed: Physical assistance 25% or less  CARE Score - 88  Assistance Needed: Physical assistance 25% or less  CARE Score - 3         Mobility Admission Current   Walk 10 Feet Assistance Needed: Physical assistance 25% or less  CARE Score - 88 Assistance Needed: Physical assistance 25% or less  CARE Score - 3     Walk 50 Feet 2 Turns      CARE Score - 88      CARE Score - 88   Walk 150 Feet      CARE Score - 88       CARE Score - 88   Walk 10 Feet Uneven      CARE Score - 88       CARE Score - 88   1 Step (Curb)      CARE Score - 88       CARE Score - 88   4 Steps      CARE Score - 88       CARE Score - 88   12 Steps      CARE Score - 88       CARE Score - 88   Picking Up Object      CARE Score - 88       CARE Score - 88   Wheelchair/Scooter Use        Wheel 50 Feet 2 Turns Assistance Needed: Physical assistance Total assistance    CARE Score - Wheel 50 Feet with Two Turns: 1    Type of Wheelchair/Scooter: Manual Assistance Needed: Physical assistance      CARE Score - Wheel 50 Feet with Two Turns: 1    Type of Wheelchair/Scooter: Manual   Wheel 150 Feet Assistance Needed: Physical assistance Total assistance    CARE Score - Wheel 150 Feet: 1    Type of Wheelchair/Scooter: Manual  Assistance Needed: Physical assistance      CARE Score - Wheel 150 Feet: 1    Type of Wheelchair/Scooter: Manual                  [1]    [Provider Hold] amlodipine  (NORVASC ) tablet 10 mg Daily    atorvastatin  (LIPITOR ) tablet 40 mg Daily    dexAMETHasone  (DECADRON ) tablet 2 mg Daily    famotidine  (PEPCID ) tablet 20 mg Daily    heparin  (porcine) 5,000 unit/mL injection 5,000 Units Q8H SCH    insulin  lispro (HumaLOG ) inj PERCENTAGE MEAL EATEN 3 Units 3xd Meals    insulin  lispro (HumaLOG ) injection CORRECTIONAL 0-20 Units ACHS    insulin  NPH (HumuLIN ,NovoLIN ) injection 5 Units Daily    senna (SENOKOT) tablet 2 tablet Nightly    tacrolimus  (ENVARSUS  XR) extended release tablet 8 mg Daily    valsartan  (  DIOVAN ) tablet 160 mg Daily

## 2024-07-26 NOTE — Plan of Care (Deleted)
 Shift Summary    Insulin  lispro was administered after elevated blood glucose was noted, with subsequent improvement in glucose levels later in the shift.  Patient engaged in both PT and OT sessions, showing progress in mobility, transfers, and self-care activities.  Safety interventions and fall prevention strategies were maintained throughout the shift, with no reported falls or injuries.  No new-onset illness or infection symptoms were documented, and vital signs remained stable.  Overall, patient demonstrated progress in functional mobility and ADLs, with stable condition throughout the shift.    Absence of New-Onset Illness or Injury: No new injuries or illnesses documented during the shift; vital signs remained stable and patient tolerated all treatments well.    Improved Ability to Complete Activities of Daily Living: Patient participated in therapeutic exercise and OT self-care training, demonstrated progress in transfers and ambulation, and required minimal to moderate assistance for dressing and bathing tasks.    Absence of Infection Signs and Symptoms: Temperature remained within normal range and skin color was appropriate for ethnicity; no documentation of infection-related symptoms.    Absence of Fall and Fall-Related Injury: Fall reduction program and safety interventions were consistently maintained, hourly visual checks and safety devices were used, and no falls or injuries were reported during the shift.

## 2024-07-26 NOTE — Plan of Care (Signed)
 Shift Summary  A&O x4. Conti x2. LBM 12/20. No pain reported. Wife visited today. Blood glucose monitored.  Patient ambulated with assistance and participated in physical therapy, showing improvement in right step length and transfer times.   Blood glucose was elevated mid-shift and insulin  lispro was administered as ordered.   Safety interventions, including fall reduction program and use of safety devices, were consistently maintained throughout the shift.   No new injuries, illnesses, or infection-related symptoms were documented during the shift.   Overall, patient remained stable and engaged in planned interventions.     Absence of New-Onset Illness or Injury: No new injuries or illnesses documented during the shift; vital signs remained stable and patient participated in planned interventions without incident.     Absence of Infection Signs and Symptoms: Temperature remained within normal range and skin color was appropriate for ethnicity; no documentation of fever or other infection-related symptoms.     Absence of Fall and Fall-Related Injury: Fall reduction program and safety interventions were maintained throughout the shift, with hourly visual checks and use of safety devices for transfers; patient remained in bed or ambulated with assistance and no falls were reported.     Absence of Infection Signs and Symptoms: Temperature and skin assessments did not change, and no new symptoms were noted.       Problem: Rehabilitation (IRF) Plan of Care  Goal: Absence of New-Onset Illness or Injury  Outcome: Shift Focus  Intervention: Prevent Fall and Fall Injury  Recent Flowsheet Documentation  Taken 07/26/2024 1630 by Koleen Ronal CROME, RN  Safety Interventions: fall reduction program maintained  Taken 07/26/2024 1430 by Koleen Ronal CROME, RN  Safety Interventions: fall reduction program maintained  Taken 07/26/2024 1237 by Koleen Ronal CROME, RN  Safety Interventions: fall reduction program maintained  Taken 07/26/2024 0800 by Koleen Ronal CROME, RN  Safety Interventions: fall reduction program maintained     Problem: Wound  Goal: Optimal Functional Ability  Intervention: Optimize Functional Ability  Recent Flowsheet Documentation  Taken 07/26/2024 0815 by Koleen Ronal CROME, RN  Activity Management: (wheelchair) up in chair  Taken 07/26/2024 0800 by Koleen Ronal CROME, RN  Activity Management: ambulated to bathroom  Goal: Absence of Infection Signs and Symptoms  Outcome: Shift Focus  Goal: Skin Health and Integrity  Intervention: Optimize Skin Protection  Recent Flowsheet Documentation  Taken 07/26/2024 1630 by Koleen Ronal CROME, RN  Pressure Reduction Techniques: frequent weight shift encouraged  Taken 07/26/2024 1430 by Koleen Ronal CROME, RN  Pressure Reduction Techniques: frequent weight shift encouraged  Taken 07/26/2024 1237 by Koleen Ronal CROME, RN  Pressure Reduction Techniques: frequent weight shift encouraged  Taken 07/26/2024 0815 by Koleen Ronal CROME, RN  Activity Management: (wheelchair) up in chair  Taken 07/26/2024 0800 by Koleen Ronal CROME, RN  Activity Management: ambulated to bathroom  Pressure Reduction Techniques: frequent weight shift encouraged     Problem: Fall Injury Risk  Goal: Absence of Fall and Fall-Related Injury  Outcome: Shift Focus  Intervention: Promote Injury-Free Environment  Recent Flowsheet Documentation  Taken 07/26/2024 1630 by Koleen Ronal CROME, RN  Safety Interventions: fall reduction program maintained  Taken 07/26/2024 1430 by Koleen Ronal CROME, RN  Safety Interventions: fall reduction program maintained  Taken 07/26/2024 1237 by Koleen Ronal CROME, RN  Safety Interventions: fall reduction program maintained  Taken 07/26/2024 0800 by Koleen Ronal CROME, RN  Safety Interventions: fall reduction program maintained     Problem: Oncology Care  Goal: Improved Activity Tolerance  Intervention: Promote Improved  Energy  Recent Flowsheet Documentation  Taken 07/26/2024 0815 by Koleen Ronal CROME, RN  Activity Management: (wheelchair) up in chair  Taken 07/26/2024 0800 by Koleen Ronal CROME, RN  Activity Management: ambulated to bathroom     Problem: Infection  Goal: Absence of Infection Signs and Symptoms  Outcome: Shift Focus

## 2024-07-27 MED ADMIN — insulin NPH (HumuLIN,NovoLIN) injection 5 Units: 5 [IU] | SUBCUTANEOUS | @ 14:00:00

## 2024-07-27 MED ADMIN — heparin (porcine) 5,000 unit/mL injection 5,000 Units: 5000 [IU] | SUBCUTANEOUS | @ 20:00:00

## 2024-07-27 MED ADMIN — heparin (porcine) 5,000 unit/mL injection 5,000 Units: 5000 [IU] | SUBCUTANEOUS | @ 03:00:00

## 2024-07-27 MED ADMIN — heparin (porcine) 5,000 unit/mL injection 5,000 Units: 5000 [IU] | SUBCUTANEOUS | @ 11:00:00

## 2024-07-27 MED ADMIN — atorvastatin (LIPITOR) tablet 40 mg: 40 mg | ORAL | @ 14:00:00

## 2024-07-27 MED ADMIN — dexAMETHasone (DECADRON) tablet 1 mg: 1 mg | ORAL | @ 14:00:00 | Stop: 2024-07-30

## 2024-07-27 MED ADMIN — famotidine (PEPCID) tablet 20 mg: 20 mg | ORAL | @ 14:00:00

## 2024-07-27 MED ADMIN — valsartan (DIOVAN) tablet 160 mg: 160 mg | ORAL | @ 14:00:00

## 2024-07-27 MED ADMIN — insulin lispro (HumaLOG) inj PERCENTAGE MEAL EATEN 3 Units: 3 [IU] | SUBCUTANEOUS | @ 18:00:00

## 2024-07-27 MED ADMIN — insulin lispro (HumaLOG) inj PERCENTAGE MEAL EATEN 3 Units: 3 [IU] | SUBCUTANEOUS | @ 23:00:00

## 2024-07-27 MED ADMIN — insulin lispro (HumaLOG) injection CORRECTIONAL 0-20 Units: 0-20 [IU] | SUBCUTANEOUS | @ 03:00:00

## 2024-07-27 MED ADMIN — insulin lispro (HumaLOG) injection CORRECTIONAL 0-20 Units: 0-20 [IU] | SUBCUTANEOUS | @ 22:00:00

## 2024-07-27 MED ADMIN — amlodipine (NORVASC) tablet 10 mg: 10 mg | ORAL | @ 14:00:00

## 2024-07-27 MED ADMIN — tacrolimus (ENVARSUS XR) extended release tablet 8 mg: 8 mg | ORAL | @ 14:00:00

## 2024-07-27 NOTE — Plan of Care (Signed)
 Shift Summary  Correctional insulin  was administered after a blood glucose of 161 mg/dL was recorded.   Fall reduction and infection prevention protocols were maintained throughout the shift.   Pain remained well controlled with no reported discomfort.   Mobility interventions and safety devices were consistently used, with the ability to turn self in bed noted.   Overall, remained stable with no acute changes or new concerns during the shift.     Absence of New-Onset Illness or Injury: No new injuries or acute illnesses documented during the shift; infection prevention measures and fall reduction interventions were consistently maintained.     Optimal Comfort and Wellbeing: Pain remained at 0 throughout the shift and no discomfort was reported.     Improved Ability to Complete Activities of Daily Living: Mobility was slightly limited but able to turn self in bed and participate in occasional walking; some extremity movement limitations persisted.

## 2024-07-27 NOTE — Discharge Summary (Signed)
 [facility]  Discharge Summary    Patient Name: Frank Leach       Medical Record Number: [id number]   Date of Birth: 1951-01-20  Sex: Male          Room/Bed: 3H204/3H204-01  Payor Info: Payor: [employer] ADV / Plan: [employer] ADV / Product Type: *No Product type* /      Admit Date: 07/22/2024  Discharge Date: 07/31/2024   Admitting Physician: Frank Debby [place], MD  Discharge Physician: Frank Jules, MD  Rehab Impairment Group Code [facility]):  (Brain Dysfunction) 02.1 Non-Traumatic ;   Etiology: Glioblastoma     Outpatient To Do  [ ]  monitor blood glucose with concurrent steroid use for radiation therapy     Admission Functional Status: Discharge Functional Status:   Activities of Daily Living:        Feeding : Min assist    Grooming: Contact Guard assist; Verbal assist/cues required    Bathing: Min assist    Toileting: Mod assist    UB Dressing: Mod assist    LB Dressing: Mod assist    IADLs: Not tested      Mobility:   Bed Mobility comments: Standby assist (supine <> sit with raised HOB)       Ambulation comments: Contact guard assist, steadying assist, Minimal assist, patient does 75% or more (EOB > bathroom > hallway ~37ft (seated rest break) > EOB; intermittent R sided LOB, requiring min A-mod A for righting; gross CGA-min A during ambulation; one-step cues for sequencing steps c RW)            Cognition/Swallow/Speech:  Patient's Vision Adequate to Safely Complete Daily Activities: Yes    Patient's Judgement Adequate to Safely Complete Daily Activities: Yes    Patient's Memory Adequate to Safely Complete Daily Activities: Yes    Patient Able to Express Needs/Desires: Yes    Patient has speech problem: No      DME Recommendations:          Willingness to Participate: Pt able and willing to participate in 3 hours of therapy.      Requires modified schedule: Pt may require rest breaks between therapy sessions      Assistive Devices: Bedside commode, Rolling walker, Straight cane (Has BSC they are using over the commode to elevate the seat. Has old wooden cane, shower chair, grab bars in shower and around toilet) PHYSICAL THERAPY        OCCUPATIONAL THERAPY       SPEECH LANGUAGE PATHOLOGY       Assistive devices:    Therapy Updates:  Team Conference Update (PT): Patient requiring at most min A for mobility and needs to be at Supervision  to discharge. DC currently set at 12/25  to address consistency of transfers, stairs, R inattention . Recommending  w/c and HHPT. (owns RW)  -----------------------------------------------------------------------------------------------------  Team Conference Update (OT): Pt requiring up to MIN A for bADLs. Family training went well. Rec HHOT, TTB (family owns), 3-in-1 BSC. On track for dc  -----------------------------------------------------------------------------------------------------  Team Conference Update (SLP): Grossly intact cognitive function, wife supports him at home and reports he is baseline. Speech has signed off.     Indication for Admission / HPI:     Etiologic Diagnosis / Description: Frank Leach is a 73 y.o. male with a past medical history of RCC s/p bilateral nephrectomy, DDKT, ESRD on HD  ad[facility]  [facility]24 for impaired mobility found to  have a new brain mass, now s/p left craniotomy for resection (12/5).Frank Leach has participated in acute inpatient physical and occupational therapies to improve functional mobility, activity tolerance, functional strength, balance, and endurance in order to facilitate safe performance of ADLs and daily routines. Frank Leach has been referred to [facility] for continued acute medical management, provision of intensive inpatient therapies, and patient/family training to facilitate safe performance of ADLs and mobility, prior to discharge home.      Hospital Course:     Outpatient Follow Up:  [ ]  discharged without insulin , recommended blood sugar checks prior to meals with documentation and patient to follow up closely with primary care provider. Decision made as steroid plan undetermined at time of discharge.   [x]  radiation oncology; Frank Leach 12/30 -- plan for concurrent radiation and oral chemotherapy  [x]  nephrology; Frank Leach 12/31  [x]  neurosurgery follow up; 08/12/24 Frank Leach    Glioblastoma   Procedure date: 07/11/24; Frank Leach  Presented with two weeks of gait imbalance and right sided weakness. Found to have a frontoparietal lesion now status post resection with neurosurgery. Completed post-operative Keppra  prophylaxis during acute admission. Pathology consistent with glioblastoma. Neuro-oncology and radiation oncology consulted during acute admission and plan for concurrent radiation and temozolomide  chemotherapy. Completed steroid taper during AIR admission. Removed staples per neurosurgery on 12/19 without issue.      HTN - HLD  Initially held home blood pressure medications during acute admission due to normotension. Restarted as indicated and discharged home on previous home regimen of amlodipine , valsartan , atorvastatin .      S/p DDKT  Baseline creatining 1.7-1.9; stable. Off mycophenolate  in the setting of malignancy as above. Continued on tacrolimus . Nephrology followed during AIR admission with assistance in tacrolimus  dosing. Discharged home on tacrolimus  8mg  daily with follow up scheduled for outpatient management.      T2DM  Monitored blood glucose during acute and AIR admission with assistance from endocrinology given dexamethasone  taper as above. Discharged home without insulin .       DME  - Wheelchair: The patient has mobility limitations that interfere with performing ADLs and MADLs (a wheelchair will improve this), mobility limitations that cannot be corrected with a cane or walker. Patient has expressed a willingness to use a wheelchair and has adequate space in their living environment for a wheelchair. Patient has a caregiver who is willing and available to assist patient w/ wheelchair transport.       Diet at time of discharge: Regular Diet    Consults: Nephrology and Endocrinology    Procedures: None    Imaging: No results found.     Discharge Medications:      Your Medication List        PAUSE taking these medications      allopurinol  100 MG tablet  Wait to take this until your doctor or other care provider tells you to start again.  Commonly known as: ZYLOPRIM   Take 1 tablet (100 mg total) by mouth every evening.     temozolomide  140 mg capsule  Wait to take this until your doctor or other care provider tells you to start again.  Commonly known as: TEMODAR   Take 1 capsule (140 mg total) by mouth daily .            STOP taking these medications      dexAMETHasone  2 MG tablet  Commonly known as: DECADRON      famotidine  20 MG tablet  Commonly known as: PEPCID   heparin  (porcine) 5,000 unit/mL injection     metFORMIN  500 MG 24 hr tablet  Commonly known as: GLUCOPHAGE -XR     ondansetron  8 MG tablet  Commonly known as: ZOFRAN      senna 8.6 mg tablet  Commonly known as: SENOKOT            START taking these medications      amlodipine  10 MG tablet  Commonly known as: NORVASC   Take 1 tablet (10 mg total) by mouth daily.     blood-glucose meter kit  Use as instructed     glucose blood test strip  Generic drug: blood sugar diagnostic  Use to check blood sugar 3 times a day & for symptoms of high or low blood sugar.     olmesartan  40 MG tablet  Commonly known as: BENICAR   Take 1 tablet (40 mg total) by mouth daily.     predniSONE  5 MG tablet  Commonly known as: DELTASONE   Take 1 tablet (5 mg total) by mouth daily.            CHANGE how you take these medications      ENVARSUS  XR 1 mg Tb24 extended release tablet  Generic drug: tacrolimus   Take 8 tablets (8 mg total) by mouth in the morning.  What changed:   how much to take  additional instructions            CONTINUE taking these medications      acetaminophen  500 MG tablet  Commonly known as: TYLENOL   Take 1-2 tablets (500-1,000 mg total) by mouth every six (6) hours as needed for pain or fever (> 38C).     atorvastatin  40 MG tablet  Commonly known as: LIPITOR   Take 1 tablet (40 mg total) by mouth daily.     latanoprost 0.005 % ophthalmic solution  Commonly known as: XALATAN  Administer 1 drop to both eyes nightly.     magnesium  (amino acid chelate) 133 mg tablet  Generic drug: magnesium  oxide-Mg AA chelate  Take 1 tablet by mouth two (2) times a day.              Significant Diagnostic Studies: Reviewed in Epic    Discharge Instructions:     Medications:  Please take all medications as prescribed below and note any changes.    Other Instructions and Information:   Follow Up instructions and Outpatient Referrals     Ambulatory Referral to Home Health      Reason for referral: glioblastoma    Physician to follow patient's care: PCP    Disciplines requested:  Physical Therapy  Occupational Therapy       Physical Therapy requested: Evaluate and treat    Occupational Therapy Requested: Evaluate and treat    Requested SOC Date:  Comment - within 48 hours of dc    Call MD for:  difficulty breathing, headache or visual disturbances      Call MD for:  persistent nausea or vomiting      Call MD for:  severe uncontrolled pain      Call MD for: Temperature > 38.5 Celsius ( > 101.3 Fahrenheit)      Discharge instructions        Activity Instructions       Activity as tolerated              Follow-Up Appointments:   Please make all follow-up appointments as noted below. If not already scheduled, please set-up an appointment with a  Primary Care Provider for continued general medical care.  Other Instructions       Call MD for:  difficulty breathing, headache or visual disturbances      Call MD for:  persistent nausea or vomiting      Call MD for:  severe uncontrolled pain      Call MD for: Temperature > 38.5 Celsius ( > 101.3 Fahrenheit)      Discharge instructions      You were hospitalized at [facility] after glioblastoma resection. You have made excellent progress during your rehab stay.     DIET:  - regular diet    ACTIVITY:  Recovery takes several months, especially if you are elderly or have another illness. Your body uses a lot of energy recovering and needs time and rest. Gradually increase your activity taking rest periods as needed to ensure that you are being safe.     WHEN TO CALL YOUR PHYSICIAN:  Following discharge from the hospital, please call 911 immediately and go to the nearest Emergency Department if you notice:  - Temperature greater than 101.84F or chills  - Pain not controlled with prescribed medications  - Uncontrolled nausea or vomiting  - Worsening or persistent abdominal pain  - Inability to pass stool for 3 days or more   - Severe or worsening headache or acute changes in vision  - Weakness or loss of sensation in your arms or legs  - Chest pain  - Shortness of breath    If you develop these symptoms or if you have trouble obtaining any of your medications, you may also call the [facility] at [phone] as needed.    You may go to the [facility] at [address] in [place] or call the [facility] Link at [phone] for further assistance.    If you develop surgical wound issues such as:  - Spreading redness  - Purulent discharge  - Increasing bleeding or drainage  - Separation of incision or sutures  - Fever >101.84F  - Pain uncontrolled by medications     On evenings/weekends you may call the [facility] Operator at [phone] and ask for the Neurosurgery Resident on call.    FOLLOW-UP:  Please see the list below of follow-up appointments and make note of the additional providers you will need to see. Please call the provided numbers if you do not receive notice of an appointment date & time.      [x]  radiation oncology; Frank Leach 12/30 -- plan for concurrent radiation and oral chemotherapy  [x]  nephrology; Frank Leach 12/31  [x]  neurosurgery follow up; 08/12/24 Frank Leach    - Referrals to home health physical therapy and occupational therapy have been placed. You will start these visits ~48 hours after discharge.     - Please reach out to your Primary Care Provider to schedule hospital follow-up within approximately 1 week of discharge.   [facility], [address], [place], [place] [address] - Phone: [phone] OR [facility], [address], [place], [place] [address] - Phone: [phone]  [facility] - [address], [place], [place] [address] - Phone: [phone]    [facility]  [facility]   [address] [place], [place] [address]   Phone: [phone]  Fax: [phone]  Appointments which have been scheduled for you      Jul 30, 2024 8:00 AM  PHYSICAL THERAPY TREATMENT with Vernell FORBES Paschal, PT  [facility] PHYSICAL THERAPY [facility] REGION) [address]  [place] [address]        Jul 30, 2024 10:00 AM  OCCUPATIONAL THERAPY TREATMENT with REHAB OT GRE[facility]  REHAB OCCUPATIONAL THERAPY [facility] REGION) [address]  [place] [address]        Jul 30, 2024 3:00 PM  TREATMENT with REHAB PT GREEN [facility]  REHAB PHYSICAL THERAPY [facility] REGION) [address]  [place] [address]        Aug 05, 2024 12:30 PM  (Arrive by 12:15 PM)  TREATMENT with Franky Marsa Sprinkles, MD  [facility] RADIATION ONCOLOGY [facility] REGION) [address] [facility] [address] [address]  1st Floor  [place] [address]  [phone]        Aug 06, 2024 7:30 AM  (Arrive by 7:15 AM)  LAB ONLY with [place] 1ST FLOOR PHLEB  LAB [facility] REGION) [address]  [facility] 1 through 4  [place] [address]        Aug 06, 2024 8:00 AM  (Arrive by 7:45 AM)  RETURN NEPHROLOGY POST with [name], DO  [facility] [place] [facility] REGION) [address]  [facility] 1 through 4  [place] [address]  [phone]        Aug 06, 2024 10:15 AM  (Arrive by 10:00 AM)  TREATMENT with RADONC HBRO INF1  [facility] RADIATION ONCOLOGY [facility] REGION) [address] [facility] [address] [facility] [address]  1st Floor  [place] [address]  [phone]        Aug 06, 2024 10:30 AM  (Arrive by 10:15 AM)  OFFICE VISIT with Franky Marsa Sprinkles, MD  [facility] RADIATION ONCOLOGY [facility] REGION) [address]  1st Floor  [place] [address]  [phone]        Aug 08, 2024 10:15 AM  (Arrive by 10:00 AM)  TREATMENT with RADONC HBRO INF1  [facility] RADIATION ONCOLOGY [facility] REGION) [address]  1st Floor  [place] [address]  [phone]        Aug 11, 2024 10:15 AM  (Arrive by 10:00 AM)  TREATMENT with RADONC HBRO INF1  [facility] RADIATION ONCOLOGY [facility] REGION) [address]  1st Floor  [place] [address]  [phone]        Aug 12, 2024 10:15 AM  (Arrive by 10:00 AM)  TREATMENT with RADONC HBRO INF1  [facility] RADIATION ONCOLOGY [facility] REGION) [address]  1st Floor  [place] [address]  [phone]        Aug 12, 2024 10:40 AM  (Arrive by 10:25 AM)  RETURN NEUROSURGERY with Rosaria CHRISTELLA Lush, MD  [facility] NEUROSURGERY [address] [place] [facility] REGION) [address]  3rd Floor  [place] [address]  [phone]        Aug 13, 2024 10:15 AM  (Arrive by 10:00 AM)  TREATMENT with RADONC [facility] INF1  [facility] RADIATION ONCOLOGY [facility] REGION) [address]  1st Floor  [place] [address]  [phone]        Aug 13, 2024 10:30 AM  (Arrive by 10:15 AM)  OFFICE VISIT with Franky Marsa Sprinkles,  MD  [facility] RADIATION ONCOLOGY [place] [facility] REGION) [address]  1st Floor  [place] [address]  [phone]        Aug 14, 2024 10:15 AM  (Arrive by 10:00 AM)  TREATMENT with RADONC HBRO INF1  [facility] RADIATION ONCOLOGY [facility] REGION) [address] [facility] [address]  1st Floor  [place] [address]  [phone]        Aug 15, 2024 10:15 AM  (Arrive by 10:00 AM)  TREATMENT with RADONC HBRO INF1  [facility] RADIATION ONCOLOGY [facility] REGION) [address]  1st Floor  [place] [address]  [phone]        Aug 18, 2024 10:15 AM  (Arrive by 10:00 AM)  TREATMENT with RADONC HBRO INF1  [facility] RADIATION ONCOLOGY [facility] REGION) [address]  1st Floor  [place] [address]  [phone]        Aug 19, 2024 10:15 AM  (Arrive by 10:00 AM)  TREATMENT with RADONC HBRO INF1  [facility] RADIATION ONCOLOGY [facility] REGION) [address]  1st Floor  [place] [address]  [phone]        Aug 20, 2024 10:15 AM  (Arrive by 10:00 AM)  TREATMENT with RADONC HBRO INF1  [facility] RADIATION ONCOLOGY [facility] REGION) [address]  1st Floor  [place] [address]  [phone]        Aug 20, 2024 10:30 AM  (Arrive by 10:15 AM)  OFFICE VISIT with Franky Marsa Sprinkles, MD  [facility] RADIATION ONCOLOGY [facility] REGION) [address]  1st Floor  [place] [address]  [phone]        Aug 21, 2024 10:15 AM  (Arrive by 10:00 AM)  TREATMENT with RADONC HBRO INF1  [facility] RADIATION ONCOLOGY [facility] REGION) [address]  1st Floor  [place] [address]  [phone]        Aug 22, 2024 10:15 AM  (Arrive by 10:00 AM)  TREATMENT with RADONC HBRO INF1  [facility] RADIATION ONCOLOGY [facility] REGION) [address]  1st Floor  [place] [address]  [phone]        Aug 26, 2024 10:15 AM  (Arrive by 10:00 AM)  TREATMENT with RADONC HBRO INF1  [facility] RADIATION ONCOLOGY [facility] REGION) [address]  1st Floor  [place] [address]  [phone]        Aug 27, 2024 10:15 AM  (Arrive by 10:00 AM)  TREATMENT with RADONC HBRO INF1  [facility] RADIATION ONCOLOGY [facility] REGION) [address]  1st Floor  [place] [address]  [phone]        Sep 09, 2024 9:00 AM  NEW CATARACT with Emery Peri, MD  [facility] OPHTHALMOLOGY [address] [place] [facility] REGION) [address]  [address]  [place] [address]  [phone]   Be advised the duration of your appointment with may range 1-2.5 hours.            Discharge Day Services:  The patient was seen and examined on the day of discharge. Vitals signs and exam are stable. Therapy goals met. Discharge medications and instructions were discussed with the patient and family, and all questions were answered.    Time spent for discharge: 30 minutes or greater    Physical Exam:  Vitals:    07/29/24 0559   BP: 141/73  Pulse: 55   Resp: 18   Temp: 36.6 ??C (97.9 ??F)   SpO2: 100%            Physical Exam:  GEN: sitting comfortably on edge of bed with nurse assistant  HEENT: Post surgical appearance. Normocephalic. Moist mucous membranes. Trachea midline.  RESP: NWOB on RA.  CV: Rate as above. Warm and well-perfused.  GI: abd soft, NTND  GU: deferred  PSYCH: Pleasant, Appropriate and Calm  SKIN: see wound care flow sheet  MSK: No pain, redness or swelling on the joints  NEURO:  Mental Status: A&Ox 3 attention intact, speech fluid and coherent, follows commands well and answers questions appropriately  Cranial Nerve: Extra ocular movements intact. Facial sensation intact bilaterally. No facial droop. Shoulder shrug diminished on the right.   Sensory: Sensation is intact to light touch in bilateral upper and lower extremities  REFLEXES:Not Tested   Motor:   - RUE 4+/5 shoulder abd, 4+/5 EF, 4+/5 EE, 4+/5 WE, 4+/5 HG  - LUE 5/5 shoulder abd, 5/5 EF, 5/5 EE, 5/5 WE, 5/5 HG  - RLE 4+/5 HF, 4+/5 KE, 4+/5 DF, 4+/5 EHL, 4+/5 PF  - LLE 5/5 HF, 5/5 KE, 5/5 DF, 5/5 EHL, 5/5 PF    Discharge Condition: Improved    Discharge Disposition: home with family assistance/supervision    Home Health: Nursing, Physical Therapy, Occupational Therapy, and Speech Therapy    Outpatient Therapy: None     Dictation software was used while making this note. Please excuse any errors made with dictation software and interpret errors as such.

## 2024-07-27 NOTE — Progress Notes (Signed)
 Physical Medicine and Rehabilitation  Daily Progress Note [facility]    ASSESSMENT:     Frank Leach is a 73 y.o. male admitted to [facility] for glioblastoma status post resection.    Rehab Impairment Group Code [facility]):  (Brain Dysfunction) 02.1 Non-Traumatic   Etiology: Glioblastoma    PLAN:     This patient is admitted to the Physical Medicine and Rehabilitation - Inpatient - D service from 8am-5pm on weekdays for questions regarding this patient. After hours, weekends, and holidays please contact the 1st Call resident pager       REHAB:   - PT and OT to maximize functional status with mobility and ADLs as well as prevention of joint contracture.   - SLP for cognitive and swallow function.  - Neuropsych for higher level cognitive evaluation and coping.  - RT for community re-integration, education, and leisure support services.  - To be discussed in weekly Interdisciplinary Team Conference.     Glioblastoma   Procedure date: 07/11/24; Dr. Zettie  Presented with two weeks of gait imbalance and right sided weakness. Found to have a frontoparietal lesion now status post resection with neurosurgery. Completed post-operative Keppra  prophylaxis during acute admission. Pathology consistent with glioblastoma. Neuro-oncology and radiation oncology consulted during acute admission and plan for concurrent radiation and temozolomide  chemotherapy. Currently on a steroid taper. Removed staples per neurosurgery on 12/19 without issue.   - dexamethasone  2mg  Q12 hours (12/14 - 12/17)  Followed by 2mg  daily x 3 days (12/18 - 12/20)  Followed by 1mg  daily x 3 days (12/21 - 12/23), then stop  - famotidine  20mg  daily   - Tylenol  650mg  Q6hrs PRN  [ ]  neurosurgery follow up  [x]  radiation oncology follow up; plan for RT to start 12/30 (outpatient)  [x]  neuro-oncology follow up; plan to start temozolomide  (new start) at the time of radiation therapy; pending MAP approval for financial assistance     HTN  - amlodipine  10mg  daily   - atorvastatin  40mg  daily   - valsartan  160mg  daily      S/p DDKT  Baseline creatining 1.7-1.9; stable. Off mycophenolate  in the setting of malignancy as above. Continued on tacrolimus .  - tacrolimus  8mg  daily   - tacrolimus  trough; goal level 4-6 ng/mL  - after completion of dexamethasone  as above, start prednisone  5mg  daily on 12/24 (ordered)  [x]  nephrology consult      T2DM  - NPH 8 units Q12 hrs  - Lispro 6 units TID w/meals  - SSI    DME  - Wheelchair: The patient has mobility limitations that interfere with performing ADLs and MADLs (a wheelchair will improve this), mobility limitations that cannot be corrected with a cane or walker. Patient has expressed a willingness to use a wheelchair and has adequate space in their living environment for a wheelchair. Patient has a caregiver who is willing and available to assist patient w/ wheelchair transport.       Daily Checklist  - Diet: regular  - DVT PPX: SQ heparin   - GI PPX: famotidine      DISPO: Patient to be discussed at weekly interdisciplinary team conference.   - EDD: 07/31/24  - Follow-up: PCP, PM&R, neurosurgery, neuro oncology, radiation oncology    SUBJECTIVE and PROGRESS WITH FUNCTIONAL ACTIVITIES:     Weekend Events:     12/20 - NAEO. Staples removed yesterday without issues. Patient seen this morning with no new questions or concerns. No changes made to plan for today.     12/21 -  BG 78-180. NAEO. AM blood pressure elevated at 179/75, will plan to restart home amlodipine  today as the elevated AM blood pressure has been a trend. Will continue to monitor. He is looking forward to a visit from family today.     OBJECTIVE:     Vital signs (last 24 hours):  Temp:  [36.5 ??C (97.7 ??F)-36.6 ??C (97.9 ??F)] 36.5 ??C (97.7 ??F)  Pulse:  [52-67] 52  Resp:  [16-18] 16  BP: (136-179)/(75-76) 179/75  MAP (mmHg):  [93-105] 105  SpO2:  [98 %-99 %] 99 %    Intake/Output (last 3 shifts):  I/O last 3 completed shifts:  In: 1240 [P.O.:1240]  Out: 600 [Urine:600]    Physical Exam:   GEN: sitting comfortably in bed, NAD  HEENT: sclera anicteric, conjunctiva clear, MMM, trachea midline, post surgical appearance  CV: warm extremities, well perfused, no peripheral edema  RESP:  NWOB on RA  ABD: not distended  SKIN: scalp incision clean, dry, intact, no erythema or bleeding noted  MSK: no notable contractures or joint swelling  NEURO: awake and alert, responds appropriately to questions   PSYCH: calm, conversationally pleasant, euthymic mood    Medications:  Scheduled Scheduled meds with Route[1]  PRN acetaminophen , 650 mg, Q6H PRN  bisacodyl, 10 mg, Daily PRN  calcium carbonate, 200 mg elem calcium, TID PRN  dextrose  in water , 12.5 g, Q15 Min PRN  guaiFENesin, 100 mg, Q4H PRN  melatonin, 3 mg, Nightly PRN  ondansetron , 4 mg, Q6H PRN        Labs/Studies: Reviewed.    Radiology Results: Reviewed    Quality Indicators      Hearing, Speech, and Vision  Ability to Hear: Adequate  Ability to See in Adequate Light: Adequate  Expression of Ideas and Wants: Without difficulty  Understanding Verbal and Non-Verbal Content: Understands    Cognitive Pattern Assessment  Cognitive Pattern Assessment Used: BIMS  Brief Interview for Mental Status (BIMS)  Repetition of Three Words (First Attempt): 3  Temporal Orientation: Year: Correct  Temporal Orientation: Month: Accurate within 5 days  Temporal Orientation: Day: Correct  Recall: Sock: Yes, no cue required  Recall: Blue: Yes, no cue required  Recall: Bed: Yes, no cue required  BIMS Summary Score: 15       Nutritional Approaches  Nutritional Approach: None of the above    ADLs  Admission Current   Eating Assistance Needed: Set-up / clean-up    CARE Score - 5 Assistance Needed: Set-up / clean-up    CARE Score - 5   Oral Hygiene  Assistance Needed: Physical assistance 25% or less  CARE Score - 3  Assistance Needed: Physical assistance    CARE Score - 3   Bladder Continence       Bowel Continence       Toileting Hygiene Assistance Needed: Physical assistance 26%-50%  CARE Score - 3 Assistance Needed: Physical assistance    CARE Score - 3    Toilet Transfer Assistance Needed: Physical assistance 25% or less  CARE Score - 3 Assistance Needed: Physical assistance    CARE Score - 3   Shower/Bathe Self Assistance Needed: Physical assistance 25% or less  CARE Score - 3  Assistance Needed: Incidental touching    CARE Score - 4   Upper Body Dressing Assistance Needed: Physical assistance 26%-50%  CARE Score - 3  Assistance Needed: Set-up / clean-up, Supervision    CARE Score - 4   Lower Body Dressing Assistance Needed: Physical assistance 26%-50%  CARE Score - 3  Assistance Needed: Physical assistance 25% or less  CARE Score - 3   On/Off Footwear Assistance Needed: Physical assistance 26%-50%  CARE Score - 3  Assistance Needed: Physical assistance 26%-50%  CARE Score - 3         Transfers Admission Current   Bed to Chair Assistance Needed: Incidental touching, Adaptive equipment    CARE Score - 88  Assistance Needed: Incidental touching, Adaptive equipment    CARE Score - 4     Lying to Sitting Assistance Needed: Supervision    CARE Score - 88  Assistance Needed: Supervision    CARE Score - 4   Roll Left/Right Assistance Needed: Supervision    CARE Score - 88  Assistance Needed: Supervision    CARE Score - 4   Sit to Lying      CARE Score - 88       CARE Score - 88   Sit to Stand Assistance Needed: Physical assistance 25% or less  CARE Score - 88  Assistance Needed: Physical assistance 25% or less  CARE Score - 3         Mobility Admission Current   Walk 10 Feet Assistance Needed: Physical assistance 25% or less  CARE Score - 88 Assistance Needed: Physical assistance 25% or less  CARE Score - 3     Walk 50 Feet 2 Turns      CARE Score - 88      CARE Score - 88   Walk 150 Feet      CARE Score - 88       CARE Score - 88   Walk 10 Feet Uneven      CARE Score - 88       CARE Score - 88   1 Step (Curb)      CARE Score - 88       CARE Score - 88   4 Steps      CARE Score - 88       CARE Score - 88   12 Steps      CARE Score - 88       CARE Score - 88   Picking Up Object      CARE Score - 88       CARE Score - 88   Wheelchair/Scooter Use        Wheel 50 Feet 2 Turns Assistance Needed: Physical assistance Total assistance    CARE Score - Wheel 50 Feet with Two Turns: 1    Type of Wheelchair/Scooter: Manual Assistance Needed: Physical assistance      CARE Score - Wheel 50 Feet with Two Turns: 1    Type of Wheelchair/Scooter: Manual   Wheel 150 Feet Assistance Needed: Physical assistance Total assistance    CARE Score - Wheel 150 Feet: 1    Type of Wheelchair/Scooter: Manual  Assistance Needed: Physical assistance      CARE Score - Wheel 150 Feet: 1    Type of Wheelchair/Scooter: Manual                    [1]    [Provider Hold] amlodipine  (NORVASC ) tablet 10 mg Daily    atorvastatin  (LIPITOR ) tablet 40 mg Daily    dexAMETHasone  (DECADRON ) tablet 1 mg Daily    famotidine  (PEPCID ) tablet 20 mg Daily    heparin  (porcine) 5,000 unit/mL injection 5,000 Units Q8H SCH    insulin  lispro (HumaLOG ) inj PERCENTAGE  MEAL EATEN 3 Units 3xd Meals    insulin  lispro (HumaLOG ) injection CORRECTIONAL 0-20 Units ACHS    insulin  NPH (HumuLIN ,NovoLIN ) injection 5 Units Daily    senna (SENOKOT) tablet 2 tablet Nightly    tacrolimus  (ENVARSUS  XR) extended release tablet 8 mg Daily    valsartan  (DIOVAN ) tablet 160 mg Daily

## 2024-07-27 NOTE — Plan of Care (Signed)
 Shift Summary  dexAMETHasone  was administered during the morning, and glucose levels were monitored with results within normal range.    Pain was assessed and remained at 0 throughout the shift.    Skin protection and pressure reduction interventions were consistently applied, including use of incontinence pads and positioning supports.    Overall, safety and infection prevention protocols were maintained, and the patient remained alert and safely mobile during the shift.  Pt with family visiting in the afternoon.     Absence of Fall and Fall-Related Injury: Fall reduction program and safety devices were maintained during all transfers and ambulation, with nonskid footwear used and hourly visual checks confirming safe mobility and alertness.    Absence of Infection Signs and Symptoms: Wearing mask in room per protective precautions measures.

## 2024-07-27 NOTE — Hospital Course (Addendum)
 Outpatient Follow Up:  [ ]  discharged without insulin , recommended blood sugar checks prior to meals with documentation and patient to follow up closely with primary care provider. Decision made as steroid plan undetermined at time of discharge.   [x]  radiation oncology; Dr. Keane 12/30 -- plan for concurrent radiation and oral chemotherapy  [x]  nephrology; Dr. [name] 12/31  [x]  neurosurgery follow up; 08/12/24 Dr. Zettie    Glioblastoma   Procedure date: 07/11/24; Dr. Zettie  Presented with two weeks of gait imbalance and right sided weakness. Found to have a frontoparietal lesion now status post resection with neurosurgery. Completed post-operative Keppra  prophylaxis during acute admission. Pathology consistent with glioblastoma. Neuro-oncology and radiation oncology consulted during acute admission and plan for concurrent radiation and temozolomide  chemotherapy. Completed steroid taper during AIR admission. Removed staples per neurosurgery on 12/19 without issue.      HTN - HLD  Initially held home blood pressure medications during acute admission due to normotension. Restarted as indicated and discharged home on previous home regimen of amlodipine , valsartan , atorvastatin .      S/p DDKT  Baseline creatining 1.7-1.9; stable. Off mycophenolate  in the setting of malignancy as above. Continued on tacrolimus . Nephrology followed during AIR admission with assistance in tacrolimus  dosing. Discharged home on 8mg  daily with follow up scheduled for outpatient management.      T2DM  Monitored blood glucose during acute and AIR admission with assistance from endocrinology given dexamethasone  taper as above. Discharged home on without insulin .       DME  - Wheelchair: The patient has mobility limitations that interfere with performing ADLs and MADLs (a wheelchair will improve this), mobility limitations that cannot be corrected with a cane or walker. Patient has expressed a willingness to use a wheelchair and has adequate space in their living environment for a wheelchair. Patient has a caregiver who is willing and available to assist patient w/ wheelchair transport.

## 2024-07-28 LAB — CBC
HEMATOCRIT: 37.6 % — ABNORMAL LOW (ref 39.0–48.0)
HEMOGLOBIN: 12.1 g/dL — ABNORMAL LOW (ref 12.9–16.5)
MEAN CORPUSCULAR HEMOGLOBIN CONC: 32.3 g/dL (ref 32.0–36.0)
MEAN CORPUSCULAR HEMOGLOBIN: 29.9 pg (ref 25.9–32.4)
MEAN CORPUSCULAR VOLUME: 92.7 fL (ref 77.6–95.7)
MEAN PLATELET VOLUME: 8.4 fL (ref 6.8–10.7)
PLATELET COUNT: 152 10*9/L (ref 150–450)
RED BLOOD CELL COUNT: 4.06 10*12/L — ABNORMAL LOW (ref 4.26–5.60)
RED CELL DISTRIBUTION WIDTH: 18.3 % — ABNORMAL HIGH (ref 12.2–15.2)
WBC ADJUSTED: 6.9 10*9/L (ref 3.6–11.2)

## 2024-07-28 LAB — BASIC METABOLIC PANEL
ANION GAP: 13 mmol/L (ref 5–14)
BLOOD UREA NITROGEN: 28 mg/dL — ABNORMAL HIGH (ref 9–23)
BUN / CREAT RATIO: 17
CALCIUM: 10.2 mg/dL (ref 8.7–10.4)
CHLORIDE: 113 mmol/L — ABNORMAL HIGH (ref 98–107)
CO2: 23.9 mmol/L (ref 20.0–31.0)
CREATININE: 1.68 mg/dL — ABNORMAL HIGH (ref 0.73–1.18)
EGFR CKD-EPI (2021) MALE: 43 mL/min/1.73m2 — ABNORMAL LOW (ref >=60)
GLUCOSE RANDOM: 89 mg/dL (ref 70–179)
POTASSIUM: 4.8 mmol/L (ref 3.4–4.8)
SODIUM: 150 mmol/L — ABNORMAL HIGH (ref 135–145)

## 2024-07-28 LAB — TACROLIMUS LEVEL, TROUGH: TACROLIMUS, TROUGH: 3.8 ng/mL — ABNORMAL LOW (ref 5.0–15.0)

## 2024-07-28 MED ADMIN — insulin NPH (HumuLIN,NovoLIN) injection 5 Units: 5 [IU] | SUBCUTANEOUS | @ 13:00:00 | Stop: 2024-07-28

## 2024-07-28 MED ADMIN — melatonin tablet 3 mg: 3 mg | ORAL | @ 03:00:00

## 2024-07-28 MED ADMIN — heparin (porcine) 5,000 unit/mL injection 5,000 Units: 5000 [IU] | SUBCUTANEOUS | @ 03:00:00

## 2024-07-28 MED ADMIN — heparin (porcine) 5,000 unit/mL injection 5,000 Units: 5000 [IU] | SUBCUTANEOUS | @ 19:00:00

## 2024-07-28 MED ADMIN — heparin (porcine) 5,000 unit/mL injection 5,000 Units: 5000 [IU] | SUBCUTANEOUS | @ 11:00:00

## 2024-07-28 MED ADMIN — atorvastatin (LIPITOR) tablet 40 mg: 40 mg | ORAL | @ 13:00:00

## 2024-07-28 MED ADMIN — dexAMETHasone (DECADRON) tablet 1 mg: 1 mg | ORAL | @ 13:00:00 | Stop: 2024-07-30

## 2024-07-28 MED ADMIN — famotidine (PEPCID) tablet 20 mg: 20 mg | ORAL | @ 13:00:00

## 2024-07-28 MED ADMIN — valsartan (DIOVAN) tablet 160 mg: 160 mg | ORAL | @ 13:00:00

## 2024-07-28 MED ADMIN — insulin lispro (HumaLOG) injection CORRECTIONAL 0-20 Units: 0-20 [IU] | SUBCUTANEOUS | @ 03:00:00

## 2024-07-28 MED ADMIN — insulin lispro (HumaLOG) injection CORRECTIONAL 0-20 Units: 0-20 [IU] | SUBCUTANEOUS | @ 23:00:00

## 2024-07-28 MED ADMIN — amlodipine (NORVASC) tablet 10 mg: 10 mg | ORAL | @ 13:00:00

## 2024-07-28 MED ADMIN — tacrolimus (ENVARSUS XR) extended release tablet 8 mg: 8 mg | ORAL | @ 13:00:00 | Stop: 2024-07-28

## 2024-07-28 NOTE — Consults (Signed)
 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 Kidney allograft function (stable):  - Serum creatinine remains within baseline of approximately 1.7-1.9.   - Transplant patients with an open wound require wound care with sterile water  only. The patient should be counseled on this at the time of discharge if they have not already been doing this.    # Immunosuppression:  - Off mycophenolate  in setting of malignancy   - Tac level goal 4-6  - On Envarsus - increased to 8 mg on 12/19 when level 3.5.  - Repeat level from today is pending  - Please obtain trough tacrolimus  trough levels prior to the morning dose of the medication and approximately 24hrs after prior dose.  - Once the patient has completed his course of dexamethasone , start prednisone  5 mg daily.    # Blood Pressure / Volume:  - Currently on valsartan  160 mg daily    # Bladder mass  - Plan for outpatient cystoscopy with urology    # Hypernatremia  - Na 150. Encourage intake of free water     # Frontal mass of brain s/p left craniotomy for resection of brain mass 12/5  # Glioblastoma  - Management per primary team  - Plan for temozolomide  75 mg/m2 oral daily for 3 weeks with adjuvant focal radiation Monday to Friday for 3 weeks followed by 6 cycles of adjuvant TMZ. Do not expect nephrotoxic effect with this chemo regimen.   - Planned discharge on 12/25    RECOMMENDATIONS:   - Continue envarsus  8 mg daily pending updated tac level  - Encourage intake of free water    - We will continue to follow.     Delon Garnet, MD  07/28/2024 9:07 AM     Medical decision-making for 07/28/2024  Findings / Data     Patient has: []  acute illness w/systemic sxs  [mod]  []  two or more stable chronic illnesses [mod]  []  one chronic illness with acute exacerbation [mod]  []  acute complicated illness  [mod]  []  Undiagnosed new problem with uncertain prognosis  [mod] [x]  illness posing risk to life or bodily function (ex. AKI)  [high]  []  chronic illness with severe exacerbation/progression  [high]  []  chronic illness with severe side effects of treatment  [high] Kidney transplant recipient, brain mass Probs At least 2:  Probs, Data, Risk   I reviewed: [x]  primary team note  []  consultant note(s)  []  external records [x]  chemistry results  [x]  CBC results  []  blood gas results  []  Other []  procedure/op note(s)   []  radiology report(s)  []  micro result(s)  []  w/ independent historian(s) Cr at baseline, reviewed primary team notes, low tacrolimus  levels   HyperNa >=3 Data Review (2 of 3)    I independently interpreted: []  Urine Sediment  []  Renal US  []  CXR Images  []  CT Images  []  Other []  EKG Tracing  Any     I discussed: []  Pathology results w/ QHPs(s) from other specialties  []  Procedural findings w/ QHPs(s) from other specialties []  Imaging w/ QHP(s) from other specialties  [x]  Treatment plan w/ QHP(s) from other specialties Plan discussed with primary team, pharmacist  Any     Mgm't requires: []   Prescription drug(s)  [mod]  []  Kidney biopsy  [mod]  []  Central line placement  [mod] [x]  High risk medication use and/or intensive toxicity monitoring [high]  []  Renal replacement therapy [high]  []  High risk kidney biopsy  [high]  []  Escalation of care  [high]  []  High risk central line placement  [high] Immunosuppression: high risk for infection Risk      _____________________________________________________________________________________    Interval:  Doing well and planning discharge later this week.   No events over weekend.    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 04/2016 and right 12/2016)  Post-Transplant Course: HD once for hyperkalemia early after transplant  Prior Transplants: none  Induction: alemtuzumab  Date of Ureteral Stent Removal: 07/29/2019  CMV and EBV Serologies: CMV D+/R+, EBV D+/R  Rejection Episodes: 01/2022 kidney biopsy showed mild focal tubulitis, patien twas treated with solumedrol 125 mg IV x3 and steroid taper  Donor Specific Antibodies: none     INPATIENT MEDICATIONS:  Current Medications[1]    Physical Exam:   Vitals:    07/27/24 0857 07/27/24 1615 07/28/24 0607 07/28/24 0829   BP: (!) 152/72 143/71 141/72 115/71   Pulse:  57 50 78   Resp:  17 18    Temp:  36.6 ??C (97.9 ??F) 36.5 ??C (97.7 ??F)    TempSrc:  Oral Oral    SpO2:  98% 100%    Weight:       Height:         No intake/output data recorded.    Intake/Output Summary (Last 24 hours) at 07/28/2024 0907  Last data filed at 07/28/2024 0600  Gross per 24 hour   Intake 140 ml   Output 600 ml   Net -460 ml   Negative 1225 for the admission    Constitutional: no acute distress  Heart: RRR  Lungs: normal WOB, on room air  Ext: no lower extremity edema    Neuro: awake, alert    Recent Labs     Units 07/24/24  0625 07/28/24  0708   NA mmol/L 144 150*   K mmol/L 5.1* 4.8   CL mmol/L 109* 113*   CO2 mmol/L 18.9* 23.9   BUN mg/dL 32* 28*   CREATININE mg/dL 8.24* 8.31*   GLU mg/dL 879 89   CALCIUM mg/dL 89.9 89.7     Recent Labs     Units 07/24/24  0625 07/28/24  0708   WBC 10*9/L 9.3 6.9   RBC 10*12/L 4.09* 4.06*   HGB g/dL 87.7* 87.8*   HCT % 62.4* 37.6*   MCV fL 91.7 92.7   MCH pg 29.8 29.9   MCHC g/dL 67.5 67.6   RDW % 83.0* 18.3*   PLT 10*9/L 202 152   MPV fL 9.0 8.4     Tac level: 3.5 (12/19); pending (12/22)         [1]   Current Facility-Administered Medications:     acetaminophen  (TYLENOL ) tablet 650 mg, Oral, Q6H PRN    amlodipine  (NORVASC ) tablet 10 mg, Oral, Daily    atorvastatin  (LIPITOR ) tablet 40 mg, Oral, Daily    bisacodyl (DULCOLAX) suppository 10 mg, Rectal, Daily PRN    calcium carbonate (TUMS) chewable tablet 200 mg elem calcium, Oral, TID PRN    [COMPLETED] dexAMETHasone  (DECADRON ) tablet 2 mg, Oral, Q12H SCH **FOLLOWED BY** [COMPLETED] dexAMETHasone  (  DECADRON ) tablet 2 mg, Oral, Daily **FOLLOWED BY** dexAMETHasone  (DECADRON ) tablet 1 mg, Oral, Daily    dextrose  50 % in water  (D50W) 50 % solution 12.5 g, Intravenous, Q15 Min PRN    famotidine  (PEPCID ) tablet 20 mg, Oral, Daily    guaiFENesin (ROBITUSSIN) oral syrup, Oral, Q4H PRN    heparin  (porcine) 5,000 unit/mL injection 5,000 Units, Subcutaneous, Q8H SCH    insulin  lispro (HumaLOG ) inj PERCENTAGE MEAL EATEN 3 Units, Subcutaneous, 3xd Meals    insulin  lispro (HumaLOG ) injection CORRECTIONAL 0-20 Units, Subcutaneous, ACHS    insulin  NPH (HumuLIN ,NovoLIN ) injection 5 Units, Subcutaneous, Daily    melatonin tablet 3 mg, Oral, Nightly PRN    ondansetron  (ZOFRAN -ODT) disintegrating tablet 4 mg, Oral, Q6H PRN    [START ON 07/30/2024] predniSONE  (DELTASONE ) tablet 5 mg, Oral, Daily    senna (SENOKOT) tablet 2 tablet, Oral, Nightly    tacrolimus  (ENVARSUS  XR) extended release tablet 8 mg, Oral, Daily    valsartan  (DIOVAN ) tablet 160 mg, Oral, Daily

## 2024-07-28 NOTE — Consults (Signed)
 Prosthetics and Orthotics Inpatient Encounter      Purpose of Visit     Measure and mold custom R afo       Actions taken today     Patient was seen today and molded for a /r custom thermoformed afo            Assessment   Assessment: Patient was seen today in his room. He was measured and molded for a custom semi solid thermoformed afo. Patient tolerated the procedure well he will be fitted in 2 -3 weeks at the [facility] after the device is fabricated. Patient tolerated the procedure well.         Plan    Return Plan:  patient to be seen at the [facility] as out patient for delivery    For any brace related issues please secure chat or call Prosthetics and Orthotics @ [phone]

## 2024-07-28 NOTE — Consults (Signed)
 Tacrolimus  Therapeutic Monitoring Pharmacy Note    Frank Leach is a 73 y.o. male continuing tacrolimus .     Indication: Kidney transplant     Date of Transplant: 06/22/2019      Prior Dosing Information: Envarsus  8 mg PO daily (inc 12/19)  FYI: Home regimen Envarsus  5 mg PO daily     Source(s) of information used to determine prior to admission dosing: Patient/Caregiver, Home Medication List, or Clinic Note    Goals:  Therapeutic Drug Levels  Tacrolimus  trough goal: 4-6 ng/mL    Additional Clinical Monitoring/Outcomes  Monitor renal function (SCr and urine output) and liver function (LFTs)  Monitor for signs/symptoms of adverse events (e.g., hyperglycemia, hyperkalemia, hypomagnesemia, hypertension, headache, tremor)    Previous Lab Results:  Tacrolimus , Trough   Date Value Ref Range Status   07/28/2024 3.8 (L) 5.0 - 15.0 ng/mL Final   07/25/2024 3.5 (L) 5.0 - 15.0 ng/mL Final   07/23/2024 3.3 (L) 5.0 - 15.0 ng/mL Final   07/22/2024 2.9 (L) 5.0 - 15.0 ng/mL Final   07/21/2024 1.6 (L) 5.0 - 15.0 ng/mL Final     Creatinine   Date Value Ref Range Status   07/28/2024 1.68 (H) 0.73 - 1.18 mg/dL Final   87/81/7974 8.24 (H) 0.73 - 1.18 mg/dL Final   87/86/7974 8.35 (H) 0.73 - 1.18 mg/dL Final        Result: Tacrolimus  trough was 3.3 ng/mL (drawn appropriately)    Pharmacokinetic Considerations and Significant Drug Interactions:  Concurrent CYP3A4 substrates/inhibitors: amlodipine     Assessment/Plan:  Recommendation(s)  Today's tacrolimus  level is subtherapeutic but trending up  continue 8 mg PO daily  increase weds (steady state) if not therapeutic    Follow-up  MWF levels ordered at 7am.   A pharmacist will continue to monitor and recommend levels as appropriate    Please page service pharmacist with questions/clarifications.    Rosina MARLA Carbon, PharmD, BCPS

## 2024-07-28 NOTE — Consults (Signed)
 Endocrine Team Diabetes Follow Up Consult Note     Consult information:  Requesting Attending Physician : Deward Adjutant, MD  Service Requesting Consult : Physical Medicine and Rehabilitation [facility])  Primary Care Provider: Kandis Stefano Houston, MD  Impression:  Frank Leach is a 73 y.o. male admitted for glioblastoma s/p resection. We have been consulted at the request of Deward Adjutant, MD to evaluate Aiden for hyperglycemia.     Medical Decision Making:  Diagnoses:  1.Type 2 Diabetes. Uncontrolled With glucoses below inpatient target but without hypoglycemia.  2. Nutrition: Complicating glycemic control. Increasing risk for both hypoglycemia and hyperglycemia.  3. Steroids. Complicating glycemic control and increasing risk for hyperglycemia.  4. Transplant. Complicating glycemic control and increasing risk for hyperglycemia.  5. Chronic Kidney Disease. Complicating glycemic control and increasing risk for hypoglycemia.    Studies reviewed 07/28/2024:  Labs: POCT-BG  Interpretation: Intermittent hyperglycemia.  Notes reviewed: Primary team and nursing notes    Overall impression based on above reviews and history:  Pt with T2DM admitted to rehab s/p resection of glioblastoma.   eGFR reduced at 43 consistent with CKD    Glucose trend continues to be below goal range. Will recommend holding all scheduled basal and meal insulin  today.     Recommendations:  - Lispro 1:30>140 ACHS  - Hypoglycemia protocol.  - POCT-BG achs.  - Ensure patient is on glucose precautions if patient taking nutrition by mouth.   - May consider Linagliptin  if glucose control needed at discharge    Thank you for this consult. Discussed plan with primary team. We will continue to follow and make recommendations and place orders as appropriate.    Please page with questions or concerns: Ritchie Slater, [place]: [phone]  Endocrinology Diabetes Care Team on call from 6AM - 5PM on weekdays. Virtual assistance is available on the weekends from 6AM-1PM.    Subjective:  Interval History:  Patient doing well today. No acute events. Tolerating PO diet. No nausea or vomiting    Initial HPI:  Frank Leach is a 73 y.o. male with past medical history of ESRD s/p renal transplant 2020 with renal-cell carcinoma, DM, HTN who was admitted for glioblastoma s/p resection    Patient stable overnight. He is tolerating PO diet. No nausea or vomiting. Patient's steroid dose reduced by 50% today    Diabetes History:  Patient has a history of Type 2 diabetes diagnosed at least 15+ years ago.  Diabetes is managed by: PCP, nephrologist.  Current home diabetes regimen: metformin  XR 500mg  qAM,empagliflozin  25mg  qam.  - since admission to [facility], patient has been on insulin  inpatient while on steroid taper  Current home blood glucose monitoring:  occasionally.  Hypoglycemia awareness: yes.  Complications related to diabetes: ESRD s/p transplant in 2020      Current Nutrition:  Active Orders   Diet    Nutrition Therapy Regular/House       ROS: As per HPI.    Scheduled Medications[1]    Current Outpatient Medications   Medication Instructions    acetaminophen  (TYLENOL ) 500-1,000 mg, Oral, Every 6 hours PRN    allopurinol  (ZYLOPRIM ) 100 mg, Oral, Every evening    [Paused] amlodipine  (NORVASC ) 10 mg, Oral, Daily (standard)    atorvastatin  (LIPITOR ) 40 mg, Oral, Daily (standard)    dexAMETHasone  (DECADRON ) 2 mg, Oral, Every 12 hours    ENVARSUS  XR 7 mg, Oral, Daily, Take one tablet daily with ONE 4 mg tablet for a total daily dose of 5mg   famotidine  (PEPCID ) 20 mg, Oral, Daily (standard)    heparin  (porcine) 5,000 Units, Subcutaneous, Every 8 hours    latanoprost (XALATAN) 0.005 % ophthalmic solution 1 drop, Nightly    magnesium  oxide-Mg AA chelate (MAGNESIUM , AMINO ACID CHELATE,) 133 mg 1 tablet, Oral, 2 times a day    metFORMIN  (GLUCOPHAGE -XR) 500 mg, Oral, Daily (RT)    [Paused] olmesartan  (BENICAR ) 40 mg, Oral, Daily (standard)    ondansetron  (ZOFRAN ) 8 mg, Oral, Every 8 hours PRN, Take 1 tablet 1 hour before the chemotherapy (temozolomide ).    [Paused] predniSONE  (DELTASONE ) 5 mg, Oral, Daily (standard)    senna (SENOKOT) 8.6 mg tablet 2 tablets, Oral, Nightly    [Paused] temozolomide  (TEMODAR ) 75 mg/m2/day, Oral, Daily (standard)         Past Medical History[2]    Past Surgical History[3]    Family History[4]    Short Social History[5]    OBJECTIVE:  BP 141/72  - Pulse 50  - Temp 36.5 ??C (97.7 ??F) (Oral)  - Resp 18  - Ht 170.2 cm (5' 7)  - Wt 70.2 kg (154 lb 12.2 oz)  - SpO2 100%  - BMI 24.24 kg/m??   Wt Readings from Last 12 Encounters:   07/22/24 70.2 kg (154 lb 12.2 oz)   07/13/24 73 kg (160 lb 15 oz)   07/03/24 72.6 kg (160 lb)   01/18/24 73.2 kg (161 lb 6.4 oz)   06/08/23 68.9 kg (151 lb 12.8 oz)   03/22/23 68.3 kg (150 lb 8 oz)   02/14/23 68.4 kg (150 lb 12.8 oz)   12/21/22 68 kg (149 lb 14.4 oz)   11/27/22 67.6 kg (149 lb)   11/02/22 68.1 kg (150 lb 3.2 oz)   10/13/22 68.2 kg (150 lb 6.4 oz)   06/02/22 67 kg (147 lb 9.6 oz)     Physical Exam  Nursing note reviewed.   Constitutional:       General: He is not in acute distress.  HENT:      Head: Normocephalic and atraumatic.   Pulmonary:      Effort: Pulmonary effort is normal. No respiratory distress.   Skin:     General: Skin is warm.   Neurological:      Mental Status: He is alert and oriented to person, place, and time.           BG/insulin  reviewed per EMR.   Glucose, POC   Date Value   07/27/2024 158 mg/dL   87/78/7974 824 mg/dL   87/78/7974 875 mg/dL   87/78/7974 95 mg/dL   87/79/7974 838 mg/dL   87/79/7974 868 mg/dL   87/79/7974 819 mg/dL (H)   87/79/7974 78 mg/dL   89/76/7986 784 MG/DL (H)   90/74/7986 55 MG/DL (L)   90/74/7986 52 MG/DL (L)   93/78/7986 96 MG/DL   93/78/7986 844 MG/DL   93/78/7986 877 MG/DL   93/79/7986 768 MG/DL (H)   93/79/7986 91 MG/DL        Summary of labs:  Lab Results   Component Value Date    A1C 6.8 (H) 07/03/2024    A1C 6.2 (H) 01/07/2024    A1C 6.0 (H) 09/10/2023     Lab Results Component Value Date    GFR 5.11 (L) 05/01/2012    CREATININE 1.75 (H) 07/24/2024     Lab Results   Component Value Date    WBC 9.3 07/24/2024    HGB 12.2 (L) 07/24/2024    HCT 37.5 (L) 07/24/2024  PLT 202 07/24/2024       Lab Results   Component Value Date    NA 144 07/24/2024    K 5.1 (H) 07/24/2024    CL 109 (H) 07/24/2024    CO2 18.9 (L) 07/24/2024    BUN 32 (H) 07/24/2024    CREATININE 1.75 (H) 07/24/2024    GLU 120 07/24/2024    CALCIUM 10.0 07/24/2024    MG 2.0 07/14/2024    PHOS 2.9 07/19/2024       Lab Results   Component Value Date    BILITOT 0.2 (L) 07/03/2024    BILIDIR 0.20 12/10/2023    PROT 7.2 07/03/2024    ALBUMIN 2.9 (L) 07/19/2024    ALT 17 07/03/2024    AST 20 07/03/2024    ALKPHOS 78 07/03/2024    GGT 20 06/21/2019                       [1]    amlodipine   10 mg Oral Daily    atorvastatin   40 mg Oral Daily    dexAMETHasone   1 mg Oral Daily    famotidine   20 mg Oral Daily    heparin  (porcine) for subcutaneous use  5,000 Units Subcutaneous Q8H SCH    insulin  lispro  3 Units Subcutaneous 3xd Meals    insulin  lispro  0-20 Units Subcutaneous ACHS    insulin  NPH  5 Units Subcutaneous Daily    [START ON 07/30/2024] predniSONE   5 mg Oral Daily    senna  2 tablet Oral Nightly    tacrolimus   8 mg Oral Daily    valsartan   160 mg Oral Daily   [2]   Past Medical History:  Diagnosis Date    Anemia     Cancer    (CMS-HCC)     clear cell adenocarcinoma of left kidney    Diabetes mellitus (CMS-HCC)     Diabetic nephropathy (CMS-HCC)     ESRD (end stage renal disease) on dialysis    (CMS-HCC)     ESRD on dialysis (CMS-HCC)     Gout     Hyperlipidemia     Hypertension    [3]   Past Surgical History:  Procedure Laterality Date    AV FISTULA PLACEMENT      PR COLONOSCOPY FLX DX W/COLLJ SPEC WHEN PFRMD N/A 04/30/2018    Procedure: COLONOSCOPY, FLEXIBLE, PROXIMAL TO SPLENIC FLEXURE; DIAGNOSTIC, W/WO COLLECTION SPECIMEN BY BRUSH OR WASH;  Surgeon: Dorn Lynwood Lauth, MD;  Location: [facility]ED[facility]; Service: Gastroenterology    PR COLONOSCOPY W/BIOPSY SINGLE/MULTIPLE N/A 04/30/2018    Procedure: COLONOSCOPY, FLEXIBLE, PROXIMAL TO SPLENIC FLEXURE; WITH BIOPSY, SINGLE OR MULTIPLE;  Surgeon: Dorn Lynwood Lauth, MD;  Location: [facility];  Service: Gastroenterology    PR COLSC FLX W/RMVL OF TUMOR POLYP LESION SNARE TQ N/A 04/30/2018    Procedure: COLONOSCOPY FLEX; W/REMOV TUMOR/LES BY SNARE;  Surgeon: Dorn Lynwood Lauth, MD;  Location: [facility];  Service: Gastroenterology    PR EXCIS SUPRATENT BRAIN TUMOR Left 07/11/2024    Procedure: Left craniotomy for resection of mass;  Surgeon: Zettie Rosaria HERO, MD;  Location: OR [facility];  Service: Neurosurgery    PR EXPLORATORY OF ABDOMEN Midline 04/14/2016    Procedure: EXPLORATORY LAPAROTOMY, EXPLORATORY CELIOTOMY WITH OR WITHOUT BIOPSY(S);  Surgeon: Marsa Sam Boss, MD;  Location: MAIN OR [facility];  Service: Transplant    PR EXPLORATORY OF ABDOMEN N/A 04/16/2016  Procedure: EXPLORATORY LAPAROTOMY, EXPLORATORY CELIOTOMY WITH OR WITHOUT BIOPSY(S);  Surgeon: Marsa Sam Boss, MD;  Location: MAIN OR [facility];  Service: Transplant    PR IONM 1 ON 1 IN OR W/ATTENDANCE EACH 15 MINUTES N/A 07/11/2024    Procedure: CONTINUOUS INTRAOPERATIVE NEUROPHYSIOLOGY MONITORING IN OR;  Surgeon: Zettie Rosaria HERO, MD;  Location: OR [facility];  Service: Neurosurgery    PR LAP, RADICAL NEPHRECTOMY Right 12/27/2016    Procedure: Robotic Xi Laparoscopy; Radical Nephrectomy (Incl Remove Gerota`S Fascia, Fatty Tissue, Reg Lymph Node, Adrenalectomy);  Surgeon: Clorinda Ole Chalk, MD;  Location: MAIN OR [facility];  Service: Urology    PR LIGATN ANGIOACCESS AV FISTULA Left 11/29/2022    Procedure: LIGATION OR BANDING OF ANGIOACCESS ARTERIOVENOUS FISTULA;  Surgeon: Toledo, Marsa Sam, MD;  Location: MAIN OR [facility];  Service: Transplant    PR MICROSURG TECHNIQUES,REQ OPER MICROSCOPE N/A 07/11/2024    Procedure: MICROSURGICAL TECHNIQUES, REQUIRING USE OF OPERATING MICROSCOPE (LIST SEPARATELY IN ADDITION TO CODE FOR PRIMARY PROCEDURE);  Surgeon: Zettie Rosaria HERO, MD;  Location: OR [facility];  Service: Neurosurgery    PR NEGATIVE PRESSURE WOUND THERAPY DME >50 SQ CM N/A 04/12/2016    Procedure: NEG PRESS WOUND TX (VAC ASSIST) INCL TOPICALS, PER SESSION, TSA GREATER THAN/= 50 CM SQUARED;  Surgeon: Marsa Sam Boss, MD;  Location: MAIN OR [facility];  Service: Transplant    PR NEGATIVE PRESSURE WOUND THERAPY DME >50 SQ CM Bilateral 04/14/2016    Procedure: NEG PRESS WOUND TX (VAC ASSIST) INCL TOPICALS, PER SESSION, TSA GREATER THAN/= 50 CM SQUARED;  Surgeon: Marsa Sam Boss, MD;  Location: MAIN OR ;  Service: Transplant    PR NEGATIVE PRESSURE WOUND THERAPY DME >50 SQ CM N/A 04/16/2016    Procedure: NEG PRESS WOUND TX (VAC ASSIST) INCL TOPICALS, PER SESSION, TSA GREATER THAN/= 50 CM SQUARED;  Surgeon: Marsa Sam Boss, MD;  Location: MAIN OR [facility];  Service: Transplant    PR NEPHRECTOMY, W/PART. URETECTOMY Bilateral 04/11/2016    Procedure: LAPAROSCOPY, SURGICAL, NEPHRECTOMY WITH TOTAL URETERECTOMY;  Surgeon: Marsa Sam Boss, MD;  Location: MAIN OR [facility];  Service: Transplant    PR REMV KIDNEY,W/RIB RESECTION Bilateral 04/11/2016    Procedure: NEPHRECTOMY, INCLUDING PARTIAL URETERECTOMY, ANY OPEN APPROACH INCLUDING RIB RESECTION;  Surgeon: Marsa Sam Boss, MD;  Location: MAIN OR [facility];  Service: Transplant    PR STEREOTACTIC COMP ASSIST PROC,CRANIAL,INTRADURAL N/A 07/11/2024    Procedure: STEREOTACTIC COMPUTER-ASSISTED (NAVIGATIONAL) PROCEDURE; CRANIAL, INTRADURAL;  Surgeon: Zettie Rosaria HERO, MD;  Location: OR [facility];  Service: Neurosurgery    PR TRANSPLANT,PREP LIVING  RENAL GRAFT N/A 06/22/2019    Procedure: [facility] STD PREP LIVING DONOR RENAL ALLGRFT (OPEN/LAPROSC) PRIOR TO TRANSPLANT, INC DISSECT/REM AS NECESS;  Surgeon: Marsa Sam Boss, MD;  Location: MAIN OR [facility];  Service: Transplant    PR TRANSPLANTATION OF KIDNEY N/A 06/22/2019    Procedure: RENAL ALLOTRANSPLANTATION, IMPLANTATION OF GRAFT; WITHOUT RECIPIENT NEPHRECTOMY;  Surgeon: Marsa Sam Boss, MD;  Location: MAIN OR [facility];  Service: Transplant    SPLENECTOMY     [4]   Family History  Problem Relation Age of Onset    Kidney disease Mother     Cancer Father     Kidney disease Brother    [5]   Social History  Tobacco Use    Smoking status: Never    Smokeless tobacco: Never   Vaping Use    Vaping status: Never Used   Substance Use Topics    Alcohol use: No    Drug use: No

## 2024-07-28 NOTE — Progress Notes (Signed)
 Physical Medicine and Rehabilitation  Daily Progress Note [facility]    ASSESSMENT:     Frank Leach is a 73 y.o. male admitted to [facility] for glioblastoma status post resection.    Rehab Impairment Group Code [facility]):  (Brain Dysfunction) 02.1 Non-Traumatic   Etiology: Glioblastoma    PLAN:     This patient is admitted to the Physical Medicine and Rehabilitation - Inpatient - D service from 8am-5pm on weekdays for questions regarding this patient. After hours, weekends, and holidays please contact the 1st Call resident pager       REHAB:   - PT and OT to maximize functional status with mobility and ADLs as well as prevention of joint contracture.   - SLP for cognitive and swallow function.  - Neuropsych for higher level cognitive evaluation and coping.  - RT for community re-integration, education, and leisure support services.  - To be discussed in weekly Interdisciplinary Team Conference.     Glioblastoma   Procedure date: 07/11/24; Dr. Zettie  Presented with two weeks of gait imbalance and right sided weakness. Found to have a frontoparietal lesion now status post resection with neurosurgery. Completed post-operative Keppra  prophylaxis during acute admission. Pathology consistent with glioblastoma. Neuro-oncology and radiation oncology consulted during acute admission and plan for concurrent radiation and temozolomide  chemotherapy. Currently on a steroid taper. Removed staples per neurosurgery on 12/19 without issue.   - dexamethasone  2mg  Q12 hours (12/14 - 12/17)  Followed by 2mg  daily x 3 days (12/18 - 12/20)  Followed by 1mg  daily x 3 days (12/21 - 12/23), then stop  - famotidine  20mg  daily   - Tylenol  650mg  Q6hrs PRN  [ ]  neurosurgery follow up  [x]  radiation oncology follow up; plan for RT to start 12/30 (outpatient)  [x]  neuro-oncology follow up; plan to start temozolomide  (new start) at the time of radiation therapy; pending MAP approval for financial assistance     HTN  - amlodipine  10mg  daily   - atorvastatin  40mg  daily   - valsartan  160mg  daily      S/p DDKT  Baseline creatining 1.7-1.9; stable. Off mycophenolate  in the setting of malignancy as above. Continued on tacrolimus .  - tacrolimus  8mg  daily   - tacrolimus  trough; goal level 4-6 ng/mL  - after completion of dexamethasone  as above, start prednisone  5mg  daily on 12/24 (ordered)  [x]  nephrology consult      T2DM  - NPH 8 units Q12 hrs  - Lispro 6 units TID w/meals  - SSI    DME  - Wheelchair: The patient has mobility limitations that interfere with performing ADLs and MADLs (a wheelchair will improve this), mobility limitations that cannot be corrected with a cane or walker. Patient has expressed a willingness to use a wheelchair and has adequate space in their living environment for a wheelchair. Patient has a caregiver who is willing and available to assist patient w/ wheelchair transport.       Daily Checklist  - Diet: regular  - DVT PPX: SQ heparin   - GI PPX: famotidine      DISPO: Patient to be discussed at weekly interdisciplinary team conference.   - EDD: 07/31/24  - Follow-up: PCP, PM&R, neurosurgery, neuro oncology, radiation oncology    SUBJECTIVE and PROGRESS WITH FUNCTIONAL ACTIVITIES:     Interval Events:     NAEO. Blood glucose 76-175, will discuss with endocrinology given completion of dexamethasone  taper tomorrow and starting prednisone . Blood pressure improved with restarting home medication.     OBJECTIVE:  Vital signs (last 24 hours):  Temp:  [36.5 ??C (97.7 ??F)-36.6 ??C (97.9 ??F)] 36.5 ??C (97.7 ??F)  Pulse:  [50-57] 50  Resp:  [17-18] 18  BP: (141-152)/(71-72) 141/72  MAP (mmHg):  [93-95] 93  SpO2:  [98 %-100 %] 100 %    Intake/Output (last 3 shifts):  I/O last 3 completed shifts:  In: 260 [P.O.:260]  Out: 825 [Urine:825]    Physical Exam:   GEN: sitting in recliner, NAD, family at bedside  HEENT: sclera anicteric, conjunctiva clear, MMM, trachea midline, post surgical appearance  CV: warm extremities, well perfused, no peripheral edema  RESP:  NWOB on RA  ABD: not distended  SKIN: scalp incision clean, dry, intact, no erythema or bleeding noted  MSK: no notable contractures or joint swelling  NEURO: awake and alert, responds appropriately to questions   PSYCH: euthymic mood, affect lifted, conversationally pleasant    Medications:  Scheduled Scheduled meds with Route[1]  PRN acetaminophen , 650 mg, Q6H PRN  bisacodyl, 10 mg, Daily PRN  calcium carbonate, 200 mg elem calcium, TID PRN  dextrose  in water , 12.5 g, Q15 Min PRN  guaiFENesin, 100 mg, Q4H PRN  melatonin, 3 mg, Nightly PRN  ondansetron , 4 mg, Q6H PRN        Labs/Studies: Reviewed.    Radiology Results: Reviewed    Quality Indicators      Hearing, Speech, and Vision  Ability to Hear: Adequate  Ability to See in Adequate Light: Adequate  Expression of Ideas and Wants: Without difficulty  Understanding Verbal and Non-Verbal Content: Understands    Cognitive Pattern Assessment  Cognitive Pattern Assessment Used: BIMS  Brief Interview for Mental Status (BIMS)  Repetition of Three Words (First Attempt): 3  Temporal Orientation: Year: Correct  Temporal Orientation: Month: Accurate within 5 days  Temporal Orientation: Day: Correct  Recall: Sock: Yes, no cue required  Recall: Blue: Yes, no cue required  Recall: Bed: Yes, no cue required  BIMS Summary Score: 15       Nutritional Approaches  Nutritional Approach: None of the above    ADLs  Admission Current   Eating Assistance Needed: Set-up / clean-up    CARE Score - 5 Assistance Needed: Set-up / clean-up    CARE Score - 5   Oral Hygiene  Assistance Needed: Physical assistance 25% or less  CARE Score - 3  Assistance Needed: Physical assistance    CARE Score - 3   Bladder Continence       Bowel Continence       Toileting Hygiene Assistance Needed: Physical assistance 26%-50%  CARE Score - 3 Assistance Needed: Physical assistance    CARE Score - 3    Toilet Transfer Assistance Needed: Physical assistance 25% or less  CARE Score - 3 Assistance Needed: Physical assistance    CARE Score - 3   Shower/Bathe Self Assistance Needed: Physical assistance 25% or less  CARE Score - 3  Assistance Needed: Incidental touching    CARE Score - 4   Upper Body Dressing Assistance Needed: Physical assistance 26%-50%  CARE Score - 3  Assistance Needed: Set-up / clean-up, Supervision    CARE Score - 4   Lower Body Dressing Assistance Needed: Physical assistance 26%-50%  CARE Score - 3  Assistance Needed: Physical assistance    CARE Score - 3   On/Off Footwear Assistance Needed: Physical assistance 26%-50%  CARE Score - 3  Assistance Needed: Physical assistance    CARE Score - 3  Transfers Admission Current   Bed to Chair Assistance Needed: Incidental touching, Adaptive equipment    CARE Score - 88  Assistance Needed: Incidental touching, Adaptive equipment    CARE Score - 4     Lying to Sitting Assistance Needed: Supervision    CARE Score - 88  Assistance Needed: Supervision    CARE Score - 4   Roll Left/Right Assistance Needed: Supervision    CARE Score - 88  Assistance Needed: Supervision    CARE Score - 4   Sit to Lying      CARE Score - 88       CARE Score - 88   Sit to Stand Assistance Needed: Physical assistance 25% or less  CARE Score - 88  Assistance Needed: Physical assistance    CARE Score - 3         Mobility Admission Current   Walk 10 Feet Assistance Needed: Physical assistance 25% or less  CARE Score - 88 Assistance Needed: Physical assistance    CARE Score - 3     Walk 50 Feet 2 Turns      CARE Score - 88      CARE Score - 88   Walk 150 Feet      CARE Score - 88       CARE Score - 88   Walk 10 Feet Uneven      CARE Score - 88       CARE Score - 88   1 Step (Curb)      CARE Score - 88       CARE Score - 88   4 Steps      CARE Score - 88       CARE Score - 88   12 Steps      CARE Score - 88       CARE Score - 88   Picking Up Object      CARE Score - 88       CARE Score - 88   Wheelchair/Scooter Use        Wheel 50 Feet 2 Turns Assistance Needed: Physical assistance Total assistance    CARE Score - Wheel 50 Feet with Two Turns: 1    Type of Wheelchair/Scooter: Manual Assistance Needed: Physical assistance      CARE Score - Wheel 50 Feet with Two Turns: 1    Type of Wheelchair/Scooter: Manual   Wheel 150 Feet Assistance Needed: Physical assistance Total assistance    CARE Score - Wheel 150 Feet: 1    Type of Wheelchair/Scooter: Manual  Assistance Needed: Physical assistance      CARE Score - Wheel 150 Feet: 1    Type of Wheelchair/Scooter: Manual                        [1]  ??? amlodipine  (NORVASC ) tablet 10 mg Daily   ??? atorvastatin  (LIPITOR ) tablet 40 mg Daily   ??? dexAMETHasone  (DECADRON ) tablet 1 mg Daily   ??? famotidine  (PEPCID ) tablet 20 mg Daily   ??? heparin  (porcine) 5,000 unit/mL injection 5,000 Units Q8H SCH   ??? insulin  lispro (HumaLOG ) inj PERCENTAGE MEAL EATEN 3 Units 3xd Meals   ??? insulin  lispro (HumaLOG ) injection CORRECTIONAL 0-20 Units ACHS   ??? insulin  NPH (HumuLIN ,NovoLIN ) injection 5 Units Daily   ??? senna (SENOKOT) tablet 2 tablet Nightly   ??? tacrolimus  (ENVARSUS  XR) extended release tablet 8 mg Daily   ???  valsartan  (DIOVAN ) tablet 160 mg Daily

## 2024-07-28 NOTE — Plan of Care (Signed)
 Shift Summary  Discharge planning advanced with confirmation of home health, outpatient therapy, and home medical equipment needs.    Family was present for education and updates regarding care and discharge arrangements.    No pain was reported and comfort was maintained throughout the shift.    Fall reduction and aspiration precautions were consistently implemented.    Patient remained stable and prepared for transition to home with necessary supports in place.     Absence of New-Onset Illness or Injury: No new injuries or acute illnesses documented during the shift; fall reduction program and aspiration precautions were consistently maintained, and patient tolerated treatments well. Activity tolerance remained stable and no pain was reported throughout the day.     Optimal Comfort and Wellbeing: Patient consistently denied pain and reported comfort; pain scale remained at 0 and no interventions were required.     Home and Community Transition Plan Established: Discharge planning progressed with arrangements for home health, outpatient therapy, and home medical equipment; family transportation and specialist appointments were confirmed, and quality data was shared.     Rounds/Family Conference: Family visited multiple times during the shift and received education regarding care and discharge planning.     Absence of Infection Signs and Symptoms: Cohorting and protective precautions were maintained throughout the shift.

## 2024-07-28 NOTE — Treatment Plan (Signed)
 DME  - Bedside Com[facility]): The patient requires a bedside commode as they are confined to a single level of the home without access to a bathroom on that level.    Tandy Bound, MD  PGY-3  Physical Medicine and Rehabilitation

## 2024-07-28 NOTE — Telephone Encounter (Signed)
 Patient wife stated patient will have [employer] insurance 08/07/24

## 2024-07-28 NOTE — Plan of Care (Signed)
 Shift Summary  Melatonin was administered PRN for sleep, resulting in a relaxed state.   Correctional insulin  was calculated and administered for a blood glucose of 158 mg/dL.   Senna was refused when offered.   Fall reduction and infection prevention interventions were consistently maintained throughout the shift.   Overall, the patient required minimal assistance, maintained comfort, and had no new issues during the shift.     Absence of New-Onset Illness or Injury: No new illness or injury was documented during the shift; infection prevention measures such as hand hygiene and rest were consistently promoted. Skin and tissue health were maintained with absorbent and incontinence pads, and no changes in neurological or musculoskeletal status were noted.     Optimal Comfort and Wellbeing: Pain was consistently rated at 0 throughout the shift, and the patient appeared relaxed after PRN melatonin was administered for sleep.     Improved Ability to Complete Activities of Daily Living: Minimal assistance was required for mobility, with the patient performing 75% or more of activities, and range of motion was maintained in all extremities.     Absence of Fall and Fall-Related Injury: Fall reduction interventions were maintained, including frequent toileting, use of safety devices for transfers, bed in lowest position, and non-skid footwear; no falls or injuries were documented.     Effective Coping: Psychosocial status remained within defined limits, and the patient was relaxed after receiving PRN melatonin for sleep.

## 2024-07-29 MED ORDER — LANCETS
0 refills | 0.00000 days | Status: CP
Start: 2024-07-29 — End: ?

## 2024-07-29 MED ORDER — TACROLIMUS XR 4 MG TABLET,EXTENDED RELEASE 24 HR
ORAL_TABLET | Freq: Every day | ORAL | 0 refills | 30.00000 days | Status: CP
Start: 2024-07-29 — End: 2024-07-29

## 2024-07-29 MED ORDER — BLOOD GLUCOSE TEST STRIPS
ORAL_STRIP | ORAL | 0 refills | 0.00000 days | Status: CP
Start: 2024-07-29 — End: ?

## 2024-07-29 MED ORDER — BLOOD-GLUCOSE METER KIT WRAPPER
ORAL | 0 refills | 0.00000 days | Status: CP
Start: 2024-07-29 — End: 2025-07-29

## 2024-07-29 MED ORDER — TACROLIMUS XR 1 MG TABLET,EXTENDED RELEASE 24 HR
ORAL_TABLET | Freq: Every day | ORAL | 0 refills | 30.00000 days | Status: CP
Start: 2024-07-29 — End: 2024-07-29

## 2024-07-29 MED ADMIN — melatonin tablet 3 mg: 3 mg | ORAL | @ 03:00:00

## 2024-07-29 MED ADMIN — heparin (porcine) 5,000 unit/mL injection 5,000 Units: 5000 [IU] | SUBCUTANEOUS | @ 12:00:00

## 2024-07-29 MED ADMIN — heparin (porcine) 5,000 unit/mL injection 5,000 Units: 5000 [IU] | SUBCUTANEOUS | @ 19:00:00

## 2024-07-29 MED ADMIN — heparin (porcine) 5,000 unit/mL injection 5,000 Units: 5000 [IU] | SUBCUTANEOUS | @ 03:00:00

## 2024-07-29 MED ADMIN — atorvastatin (LIPITOR) tablet 40 mg: 40 mg | ORAL | @ 14:00:00

## 2024-07-29 MED ADMIN — dexAMETHasone (DECADRON) tablet 1 mg: 1 mg | ORAL | @ 14:00:00 | Stop: 2024-07-29

## 2024-07-29 MED ADMIN — tacrolimus (ENVARSUS XR) extended release tablet 9 mg: 9 mg | ORAL | @ 14:00:00 | Stop: 2024-07-29

## 2024-07-29 MED ADMIN — insulin lispro (HumaLOG) injection CORRECTIONAL 0-20 Units: 0-20 [IU] | SUBCUTANEOUS | @ 22:00:00

## 2024-07-29 MED ADMIN — famotidine (PEPCID) tablet 20 mg: 20 mg | ORAL | @ 14:00:00

## 2024-07-29 MED ADMIN — valsartan (DIOVAN) tablet 160 mg: 160 mg | ORAL | @ 14:00:00

## 2024-07-29 MED ADMIN — insulin lispro (HumaLOG) injection CORRECTIONAL 0-20 Units: 0-20 [IU] | SUBCUTANEOUS | @ 03:00:00

## 2024-07-29 MED ADMIN — amlodipine (NORVASC) tablet 10 mg: 10 mg | ORAL | @ 14:00:00

## 2024-07-29 NOTE — Consults (Signed)
 Endocrine Team Diabetes Follow Up Consult Note     Consult information:  Requesting Attending Physician : Deward Adjutant, MD  Service Requesting Consult : Physical Medicine and Rehabilitation [facility])  Primary Care Provider: Kandis Stefano Houston, MD  Impression:  Frank Leach is a 73 y.o. male admitted for glioblastoma s/p resection. We have been consulted at the request of Deward Adjutant, MD to evaluate Frank Leach for hyperglycemia.     Medical Decision Making:  Diagnoses:  1.Type 2 Diabetes. Uncontrolled With glucoses below inpatient target but without hypoglycemia.  2. Nutrition: Complicating glycemic control. Increasing risk for both hypoglycemia and hyperglycemia.  3. Steroids. Complicating glycemic control and increasing risk for hyperglycemia.  4. Transplant. Complicating glycemic control and increasing risk for hyperglycemia.  5. Chronic Kidney Disease. Complicating glycemic control and increasing risk for hypoglycemia.    Studies reviewed 07/29/2024:  Labs: POCT-BG  Interpretation: Intermittent hyperglycemia.  Notes reviewed: Primary team and nursing notes    Overall impression based on above reviews and history:  Pt with T2DM admitted to rehab s/p resection of glioblastoma.   eGFR reduced at 43 consistent with CKD    Glucose trend continues to be below goal range. All scheduled basal and meal insulin  discontinued.     Recommendations:  - Lispro sliding scale 1:50>140 ACHS  - Hypoglycemia protocol.  - POCT-BG achs.  - Ensure patient is on glucose precautions if patient taking nutrition by mouth.     Thank you for this consult. Discussed plan with primary team. We will continue to follow and make recommendations and place orders as appropriate.    Please page with questions or concerns: Ritchie Slater, [place]: [phone]  Endocrinology Diabetes Care Team on call from 6AM - 5PM on weekdays. Virtual assistance is available on the weekends from 6AM-1PM.    Subjective:  Interval History:  No acute concerns at this time.    Initial HPI:  Frank Leach is a 73 y.o. 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  Diabetes is managed by: PCP, nephrologist.  Current home diabetes regimen: metformin  XR 500mg  qAM,empagliflozin  25mg  qam.  - since admission to [facility], patient has been on insulin  inpatient while on steroid taper  Current home blood glucose monitoring:  occasionally.  Hypoglycemia awareness: yes.  Complications related to diabetes: ESRD s/p transplant in 2020      Current Nutrition:  Active Orders   Diet    Nutrition Therapy Regular/House       ROS: As per HPI.    Scheduled Medications[1]    Current Outpatient Medications   Medication Instructions    acetaminophen  (TYLENOL ) 500-1,000 mg, Oral, Every 6 hours PRN    allopurinol  (ZYLOPRIM ) 100 mg, Oral, Every evening    [Paused] amlodipine  (NORVASC ) 10 mg, Oral, Daily (standard)    atorvastatin  (LIPITOR ) 40 mg, Oral, Daily (standard)    dexAMETHasone  (DECADRON ) 2 mg, Oral, Every 12 hours    ENVARSUS  XR 7 mg, Oral, Daily, Take one tablet daily with ONE 4 mg tablet for a total daily dose of 5mg     famotidine  (PEPCID ) 20 mg, Oral, Daily (standard)    heparin  (porcine) 5,000 Units, Subcutaneous, Every 8 hours    latanoprost (XALATAN) 0.005 % ophthalmic solution 1 drop, Nightly    magnesium  oxide-Mg AA chelate (MAGNESIUM , AMINO ACID CHELATE,)  133 mg 1 tablet, Oral, 2 times a day    metFORMIN  (GLUCOPHAGE -XR) 500 mg, Oral, Daily (RT)    [Paused] olmesartan  (BENICAR ) 40 mg, Oral, Daily (standard)    ondansetron  (ZOFRAN ) 8 mg, Oral, Every 8 hours PRN, Take 1 tablet 1 hour before the chemotherapy (temozolomide ).    [Paused] predniSONE  (DELTASONE ) 5 mg, Oral, Daily (standard)    senna (SENOKOT) 8.6 mg tablet 2 tablets, Oral, Nightly    [Paused] temozolomide  (TEMODAR ) 75 mg/m2/day, Oral, Daily (standard)         Past Medical History[2]    Past Surgical History[3]    Family History[4]    Short Social History[5]    OBJECTIVE:  BP 141/73  - Pulse 55  - Temp 36.6 ??C (97.9 ??F) (Oral)  - Resp 18  - Ht 170.2 cm (5' 7)  - Wt 70.2 kg (154 lb 12.2 oz)  - SpO2 100%  - BMI 24.24 kg/m??   Wt Readings from Last 12 Encounters:   07/22/24 70.2 kg (154 lb 12.2 oz)   07/13/24 73 kg (160 lb 15 oz)   07/03/24 72.6 kg (160 lb)   01/18/24 73.2 kg (161 lb 6.4 oz)   06/08/23 68.9 kg (151 lb 12.8 oz)   03/22/23 68.3 kg (150 lb 8 oz)   02/14/23 68.4 kg (150 lb 12.8 oz)   12/21/22 68 kg (149 lb 14.4 oz)   11/27/22 67.6 kg (149 lb)   11/02/22 68.1 kg (150 lb 3.2 oz)   10/13/22 68.2 kg (150 lb 6.4 oz)   06/02/22 67 kg (147 lb 9.6 oz)     Physical Exam  Nursing note reviewed.   Constitutional:       General: He is not in acute distress.  HENT:      Head: Normocephalic and atraumatic.   Pulmonary:      Effort: Pulmonary effort is normal. No respiratory distress.   Skin:     General: Skin is warm.   Neurological:      Mental Status: He is alert and oriented to person, place, and time.           BG/insulin  reviewed per EMR.   Glucose, POC   Date Value   07/28/2024 223 mg/dL (H)   87/77/7974 824 mg/dL   87/77/7974 95 mg/dL   87/77/7974 76 mg/dL   87/78/7974 841 mg/dL   87/78/7974 824 mg/dL   87/78/7974 875 mg/dL   87/78/7974 95 mg/dL   89/76/7986 784 MG/DL (H)   90/74/7986 55 MG/DL (L)   90/74/7986 52 MG/DL (L)   93/78/7986 96 MG/DL   93/78/7986 844 MG/DL   93/78/7986 877 MG/DL   93/79/7986 768 MG/DL (H)   93/79/7986 91 MG/DL        Summary of labs:  Lab Results   Component Value Date    A1C 6.8 (H) 07/03/2024    A1C 6.2 (H) 01/07/2024    A1C 6.0 (H) 09/10/2023     Lab Results   Component Value Date    GFR 5.11 (L) 05/01/2012    CREATININE 1.68 (H) 07/28/2024     Lab Results   Component Value Date    WBC 6.9 07/28/2024    HGB 12.1 (L) 07/28/2024    HCT 37.6 (L) 07/28/2024    PLT 152 07/28/2024       Lab Results Component Value Date    NA 150 (H) 07/28/2024    K 4.8 07/28/2024    CL 113 (H) 07/28/2024    CO2  23.9 07/28/2024    BUN 28 (H) 07/28/2024    CREATININE 1.68 (H) 07/28/2024    GLU 89 07/28/2024    CALCIUM 10.2 07/28/2024    MG 2.0 07/14/2024    PHOS 2.9 07/19/2024       Lab Results   Component Value Date    BILITOT 0.2 (L) 07/03/2024    BILIDIR 0.20 12/10/2023    PROT 7.2 07/03/2024    ALBUMIN 2.9 (L) 07/19/2024    ALT 17 07/03/2024    AST 20 07/03/2024    ALKPHOS 78 07/03/2024    GGT 20 06/21/2019                         [1]    amlodipine   10 mg Oral Daily    atorvastatin   40 mg Oral Daily    dexAMETHasone   1 mg Oral Daily    famotidine   20 mg Oral Daily    heparin  (porcine) for subcutaneous use  5,000 Units Subcutaneous Q8H SCH    insulin  lispro  0-20 Units Subcutaneous ACHS    [START ON 07/30/2024] predniSONE   5 mg Oral Daily    senna  2 tablet Oral Nightly    tacrolimus   9 mg Oral Daily    valsartan   160 mg Oral Daily   [2]   Past Medical History:  Diagnosis Date    Anemia     Cancer    (CMS-HCC)     clear cell adenocarcinoma of left kidney    Diabetes mellitus (CMS-HCC)     Diabetic nephropathy (CMS-HCC)     ESRD (end stage renal disease) on dialysis    (CMS-HCC)     ESRD on dialysis (CMS-HCC)     Gout     Hyperlipidemia     Hypertension    [3]   Past Surgical History:  Procedure Laterality Date    AV FISTULA PLACEMENT      PR COLONOSCOPY FLX DX W/COLLJ SPEC WHEN PFRMD N/A 04/30/2018    Procedure: COLONOSCOPY, FLEXIBLE, PROXIMAL TO SPLENIC FLEXURE; DIAGNOSTIC, W/WO COLLECTION SPECIMEN BY BRUSH OR WASH;  Surgeon: Dorn Lynwood Lauth, MD;  Location: [facility];  Service: Gastroenterology    PR COLONOSCOPY W/BIOPSY SINGLE/MULTIPLE N/A 04/30/2018    Procedure: COLONOSCOPY, FLEXIBLE, PROXIMAL TO SPLENIC FLEXURE; WITH BIOPSY, SINGLE OR MULTIPLE;  Surgeon: Dorn Lynwood Lauth, MD;  Location: [facility];  Service: Gastroenterology    PR COLSC FLX W/RMVL OF TUMOR POLYP LESION SNARE TQ N/A 04/30/2018    Procedure: COLONOSCOPY FLEX; W/REMOV TUMOR/LES BY SNARE;  Surgeon: Dorn Lynwood Lauth, MD;  Location: [facility];  Service: Gastroenterology    PR EXCIS SUPRATENT BRAIN TUMOR Left 07/11/2024    Procedure: Left craniotomy for resection of mass;  Surgeon: Zettie Rosaria HERO, MD;  Location: OR [facility];  Service: Neurosurgery    PR EXPLORATORY OF ABDOMEN Midline 04/14/2016    Procedure: EXPLORATORY LAPAROTOMY, EXPLORATORY CELIOTOMY WITH OR WITHOUT BIOPSY(S);  Surgeon: Marsa Sam Boss, MD;  Location: MAIN OR [facility];  Service: Transplant    PR EXPLORATORY OF ABDOMEN N/A 04/16/2016    Procedure: EXPLORATORY LAPAROTOMY, EXPLORATORY CELIOTOMY WITH OR WITHOUT BIOPSY(S);  Surgeon: Marsa Sam Boss, MD;  Location: MAIN OR [place];  Service: Transplant    PR IONM 1 ON 1 IN OR W/ATTENDANCE EACH 15 MINUTES N/A 07/11/2024    Procedure: CONTINUOUS INTRAOPERATIVE NEUROPHYSIOLOGY MONITORING IN OR;  Surgeon: Zettie Rosaria HERO, MD;  Location: OR [facility];  Service: Neurosurgery    PR LAP, RADICAL NEPHRECTOMY Right 12/27/2016    Procedure: Robotic Xi Laparoscopy; Radical Nephrectomy (Incl Remove Gerota`S Fascia, Fatty Tissue, Reg Lymph Node, Adrenalectomy);  Surgeon: Clorinda Ole Chalk, MD;  Location: MAIN OR [facility];  Service: Urology    PR LIGATN ANGIOACCESS AV FISTULA Left 11/29/2022    Procedure: LIGATION OR BANDING OF ANGIOACCESS ARTERIOVENOUS FISTULA;  Surgeon: [place], Marsa Messier, MD;  Location: MAIN OR [facility];  Service: Transplant    PR MICROSURG TECHNIQUES,REQ OPER MICROSCOPE N/A 07/11/2024    Procedure: MICROSURGICAL TECHNIQUES, REQUIRING USE OF OPERATING MICROSCOPE (LIST SEPARATELY IN ADDITION TO CODE FOR PRIMARY PROCEDURE);  Surgeon: Zettie Rosaria HERO, MD;  Location: OR [facility];  Service: Neurosurgery    PR NEGATIVE PRESSURE WOUND THERAPY DME >50 SQ CM N/A 04/12/2016    Procedure: NEG PRESS WOUND TX (VAC ASSIST) INCL TOPICALS, PER SESSION, TSA GREATER THAN/= 50 CM SQUARED;  Surgeon: Marsa Messier Boss, MD;  Location: MAIN OR [place];  Service: Transplant    PR NEGATIVE PRESSURE WOUND THERAPY DME >50 SQ CM Bilateral 04/14/2016    Procedure: NEG PRESS WOUND TX (VAC ASSIST) INCL TOPICALS, PER SESSION, TSA GREATER THAN/= 50 CM SQUARED;  Surgeon: Marsa Messier Boss, MD;  Location: MAIN OR [place];  Service: Transplant    PR NEGATIVE PRESSURE WOUND THERAPY DME >50 SQ CM N/A 04/16/2016    Procedure: NEG PRESS WOUND TX (VAC ASSIST) INCL TOPICALS, PER SESSION, TSA GREATER THAN/= 50 CM SQUARED;  Surgeon: Marsa Messier Boss, MD;  Location: MAIN OR [facility];  Service: Transplant    PR NEPHRECTOMY, W/PART. URETECTOMY Bilateral 04/11/2016    Procedure: LAPAROSCOPY, SURGICAL, NEPHRECTOMY WITH TOTAL URETERECTOMY;  Surgeon: Marsa Messier Boss, MD;  Location: MAIN OR [facility];  Service: Transplant    PR REMV KIDNEY,W/RIB RESECTION Bilateral 04/11/2016    Procedure: NEPHRECTOMY, INCLUDING PARTIAL URETERECTOMY, ANY OPEN APPROACH INCLUDING RIB RESECTION;  Surgeon: Marsa Messier Boss, MD;  Location: [facility] OR [facility];  Service: Transplant    PR STEREOTACTIC COMP ASSIST PROC,CRANIAL,INTRADURAL N/A 07/11/2024    Procedure: STEREOTACTIC COMPUTER-ASSISTED (NAVIGATIONAL) PROCEDURE; CRANIAL, INTRADURAL;  Surgeon: Zettie Rosaria HERO, MD;  Location: OR [facility];  Service: Neurosurgery    PR TRANSPLANT,PREP LIVING  RENAL GRAFT N/A 06/22/2019    Procedure: [facility] STD PREP LIVING DONOR RENAL ALLGRFT (OPEN/LAPROSC) PRIOR TO TRANSPLANT, INC DISSECT/REM AS NECESS;  Surgeon: Marsa Messier Boss, MD;  Location: MAIN OR [facility];  Service: Transplant    PR TRANSPLANTATION OF KIDNEY N/A 06/22/2019    Procedure: RENAL ALLOTRANSPLANTATION, IMPLANTATION OF GRAFT; WITHOUT RECIPIENT NEPHRECTOMY;  Surgeon: Marsa Messier Boss, MD;  Location: MAIN OR [facility];  Service: Transplant    SPLENECTOMY     [4]   Family History  Problem Relation Age of Onset    Kidney disease Mother     Cancer Father     Kidney disease Brother    [5] Social History  Tobacco Use    Smoking status: Never    Smokeless tobacco: Never   Vaping Use    Vaping status: Never Used   Substance Use Topics    Alcohol use: No    Drug use: No

## 2024-07-29 NOTE — Plan of Care (Signed)
 Shift Summary  Correctional insulin  was administered for elevated blood glucose, and sleep was supported with melatonin.   Fall prevention strategies were maintained throughout the shift, including frequent toileting, bed in lowest position, and use of nonskid footwear.   Infection prevention and safety interventions were consistently implemented.   Aspiration protocol maintained. Takes meds with applesauce.   Pain remained well controlled and motor/sensory function was intact.   Patient remained alert, demonstrated appropriate judgement, and overall status was stable during the shift.     Absence of New-Onset Illness or Injury: No new-onset illness or injury documented during the shift; infection prevention measures such as hand hygiene were consistently promoted and safety interventions were maintained throughout the night.     Optimal Comfort and Wellbeing: Sleep was supported with minimized awakenings and melatonin administration; patient remained alert and demonstrated appropriate cognition and judgement.     Absence of Fall and Fall-Related Injury: Fall reduction program, bed in lowest position, nonskid footwear, and safety devices for transfers were consistently maintained; hourly visual checks were performed, with no falls or injuries reported.     Effective Coping: Psychosocial status remained within defined limits and patient followed commands throughout the shift.     Optimal Pain Control and Function: Pain was consistently rated at 0 and no pain interventions were required; patient demonstrated normal motor strength and full sensation in extremities.

## 2024-07-29 NOTE — Progress Notes (Signed)
 Physical Medicine and Rehabilitation  Daily Progress Note [facility]    ASSESSMENT:     Frank Leach is a 73 y.o. male admitted to [facility] for glioblastoma status post resection.    Rehab Impairment Group Code [facility]):  (Brain Dysfunction) 02.1 Non-Traumatic   Etiology: Glioblastoma    PLAN:     This patient is admitted to the Physical Medicine and Rehabilitation - Inpatient - D service from 8am-5pm on weekdays for questions regarding this patient. After hours, weekends, and holidays please contact the 1st Call resident pager       REHAB:   - PT and OT to maximize functional status with mobility and ADLs as well as prevention of joint contracture.   - SLP for cognitive and swallow function.  - Neuropsych for higher level cognitive evaluation and coping.  - RT for community re-integration, education, and leisure support services.  - To be discussed in weekly Interdisciplinary Team Conference.     Glioblastoma   Procedure date: 07/11/24; Dr. Zettie  Presented with two weeks of gait imbalance and right sided weakness. Found to have a frontoparietal lesion now status post resection with neurosurgery. Completed post-operative Keppra  prophylaxis during acute admission. Pathology consistent with glioblastoma. Neuro-oncology and radiation oncology consulted during acute admission and plan for concurrent radiation and temozolomide  chemotherapy. Currently on a steroid taper. Removed staples per neurosurgery on 12/19 without issue.   - dexamethasone  2mg  Q12 hours (12/14 - 12/17)  Followed by 2mg  daily x 3 days (12/18 - 12/20)  Followed by 1mg  daily x 3 days (12/21 - 12/23), then stop  - famotidine  20mg  daily   - Tylenol  650mg  Q6hrs PRN  [x]  neurosurgery follow up; 08/12/24 Dr. Zettie  [x]  radiation oncology follow up; plan for RT to start 12/30 (outpatient)  [x]  neuro-oncology follow up; plan to start temozolomide  (new start) at the time of radiation therapy; pending MAP approval for financial assistance     HTN  - amlodipine  10mg  daily   - atorvastatin  40mg  daily   - valsartan  160mg  daily      S/p DDKT  Baseline creatining 1.7-1.9; stable. Off mycophenolate  in the setting of malignancy as above. Continued on tacrolimus .  - tacrolimus  9mg  daily; pharmacy & nephrology assisting with dosing  - tacrolimus  trough; goal level 4-6 ng/mL  - after completion of dexamethasone  as above, start prednisone  5mg  daily on 12/24 (ordered)  [x]  nephrology consult      T2DM  Last Hgb A1c: 6.8 (07/03/24)  Required increased insulin  needs secondary to high dose steroids as above. Planning to discharge home on oral prednisone . Endocrinology consulted and assisted with management of insulin  needs. Discontinued long acting and meal time insulin  12/22.   - SSI  [x]  endocrinology consult    DME  - Wheelchair: The patient has mobility limitations that interfere with performing ADLs and MADLs (a wheelchair will improve this), mobility limitations that cannot be corrected with a cane or walker. Patient has expressed a willingness to use a wheelchair and has adequate space in their living environment for a wheelchair. Patient has a caregiver who is willing and available to assist patient w/ wheelchair transport.       Daily Checklist  - Diet: regular  - DVT PPX: SQ heparin   - GI PPX: famotidine      DISPO: Patient to be discussed at weekly interdisciplinary team conference.   - EDD: 07/31/24  - Follow-up: PCP, PM&R, neurosurgery, neuro oncology, radiation oncology    SUBJECTIVE and PROGRESS WITH FUNCTIONAL ACTIVITIES:  Interval Events:   NAEO. BG 87-223. Changed insulin  per endocrinology to only SSI. Will continue to monitor. No complaints or questions today. Planning for discharge as scheduled as above.     OBJECTIVE:     Vital signs (last 24 hours):  Temp:  [36.5 ??C (97.7 ??F)-36.6 ??C (97.9 ??F)] 36.6 ??C (97.9 ??F)  Pulse:  [55-78] 55  Resp:  [17-18] 18  BP: (115-141)/(71-78) 141/73  MAP (mmHg):  [86-94] 94  SpO2:  [100 %] 100 %    Intake/Output (last 3 shifts):  I/O last 3 completed shifts:  In: 140 [P.O.:140]  Out: 1225 [Urine:1225]    Physical Exam:   GEN: sitting comfortably in recliner, NAD  HEENT: sclera anicteric, conjunctiva clear, MMM, trachea midline, post surgical appearance  CV: warm extremities, well perfused, no peripheral edema  RESP:  NWOB on RA  ABD: not distended  SKIN: scalp incision clean, dry, intact, no erythema or bleeding noted  MSK: no notable contractures or joint swelling  NEURO: awake and alert, responds appropriately to questions   PSYCH: calm, pleasant    Medications:  Scheduled Scheduled meds with Route[1]  PRN acetaminophen , 650 mg, Q6H PRN  bisacodyl, 10 mg, Daily PRN  calcium carbonate, 200 mg elem calcium, TID PRN  dextrose  in water , 12.5 g, Q15 Min PRN  guaiFENesin, 100 mg, Q4H PRN  melatonin, 3 mg, Nightly PRN  ondansetron , 4 mg, Q6H PRN        Labs/Studies: Reviewed.    Radiology Results: Reviewed    Quality Indicators      Hearing, Speech, and Vision  Ability to Hear: Adequate  Ability to See in Adequate Light: Adequate  Expression of Ideas and Wants: Without difficulty  Understanding Verbal and Non-Verbal Content: Understands    Cognitive Pattern Assessment  Cognitive Pattern Assessment Used: BIMS  Brief Interview for Mental Status (BIMS)  Repetition of Three Words (First Attempt): 3  Temporal Orientation: Year: Correct  Temporal Orientation: Month: Accurate within 5 days  Temporal Orientation: Day: Correct  Recall: Sock: Yes, no cue required  Recall: Blue: Yes, no cue required  Recall: Bed: Yes, no cue required  BIMS Summary Score: 15       Nutritional Approaches  Nutritional Approach: None of the above    ADLs  Admission Current   Eating Assistance Needed: Set-up / clean-up    CARE Score - 5 Assistance Needed: Set-up / clean-up    CARE Score - 5   Oral Hygiene  Assistance Needed: Physical assistance 25% or less  CARE Score - 3  Assistance Needed: Physical assistance    CARE Score - 3   Bladder Continence       Bowel Continence       Toileting Hygiene Assistance Needed: Physical assistance 26%-50%  CARE Score - 3 Assistance Needed: Physical assistance    CARE Score - 3    Toilet Transfer Assistance Needed: Physical assistance 25% or less  CARE Score - 3 Assistance Needed: Physical assistance    CARE Score - 3   Shower/Bathe Self Assistance Needed: Physical assistance 25% or less  CARE Score - 3  Assistance Needed: Incidental touching    CARE Score - 4   Upper Body Dressing Assistance Needed: Physical assistance 26%-50%  CARE Score - 3  Assistance Needed: Set-up / clean-up, Supervision    CARE Score - 4   Lower Body Dressing Assistance Needed: Physical assistance 26%-50%  CARE Score - 3  Assistance Needed: Physical assistance    CARE Score -  3   On/Off Footwear Assistance Needed: Physical assistance 26%-50%  CARE Score - 3  Assistance Needed: Physical assistance    CARE Score - 3         Transfers Admission Current   Bed to Chair Assistance Needed: Incidental touching, Adaptive equipment    CARE Score - 88  Assistance Needed: Incidental touching, Adaptive equipment    CARE Score - 4     Lying to Sitting Assistance Needed: Supervision    CARE Score - 88  Assistance Needed: Supervision    CARE Score - 4   Roll Left/Right Assistance Needed: Supervision    CARE Score - 88  Assistance Needed: Supervision    CARE Score - 4   Sit to Lying      CARE Score - 88       CARE Score - 88   Sit to Stand Assistance Needed: Physical assistance 25% or less  CARE Score - 88  Assistance Needed: Physical assistance    CARE Score - 3         Mobility Admission Current   Walk 10 Feet Assistance Needed: Physical assistance 25% or less  CARE Score - 88 Assistance Needed: Physical assistance    CARE Score - 3     Walk 50 Feet 2 Turns      CARE Score - 88      CARE Score - 88   Walk 150 Feet      CARE Score - 88       CARE Score - 88   Walk 10 Feet Uneven      CARE Score - 88       CARE Score - 88   1 Step (Curb)      CARE Score - 88       CARE Score - 88 4 Steps      CARE Score - 88       CARE Score - 88   12 Steps      CARE Score - 88       CARE Score - 88   Picking Up Object      CARE Score - 88       CARE Score - 88   Wheelchair/Scooter Use        Wheel 50 Feet 2 Turns Assistance Needed: Physical assistance Total assistance    CARE Score - Wheel 50 Feet with Two Turns: 1    Type of Wheelchair/Scooter: Manual Assistance Needed: Physical assistance      CARE Score - Wheel 50 Feet with Two Turns: 1    Type of Wheelchair/Scooter: Manual   Wheel 150 Feet Assistance Needed: Physical assistance Total assistance    CARE Score - Wheel 150 Feet: 1    Type of Wheelchair/Scooter: Manual  Assistance Needed: Physical assistance      CARE Score - Wheel 150 Feet: 1    Type of Wheelchair/Scooter: Manual                          [1]    amlodipine  (NORVASC ) tablet 10 mg Daily    atorvastatin  (LIPITOR ) tablet 40 mg Daily    dexAMETHasone  (DECADRON ) tablet 1 mg Daily    famotidine  (PEPCID ) tablet 20 mg Daily    heparin  (porcine) 5,000 unit/mL injection 5,000 Units Q8H SCH    insulin  lispro (HumaLOG ) injection CORRECTIONAL 0-20 Units ACHS    senna (SENOKOT) tablet 2 tablet Nightly  tacrolimus  (ENVARSUS  XR) extended release tablet 9 mg Daily    valsartan  (DIOVAN ) tablet 160 mg Daily

## 2024-07-29 NOTE — Consults (Signed)
 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 Kidney allograft function (stable):  - Serum creatinine remains within baseline of approximately 1.7-1.9.   - Transplant patients with an open wound require wound care with sterile water  only. The patient should be counseled on this at the time of discharge if they have not already been doing this.    # Immunosuppression:  - Off mycophenolate  in setting of malignancy   - Tac level goal 4-6  - On Envarsus - increased to 8 mg on 12/19 when level 3.5.  - Level 12/22 = 3.8. Continue current dosing (home dosing is 5)  - Please obtain trough tacrolimus  trough levels prior to the morning dose of the medication and approximately 24hrs after prior dose.  - Once the patient has completed his course of dexamethasone , start prednisone  5 mg daily.    # Blood Pressure / Volume: controlled (SBP 115-140 mmHg)  - Currently on valsartan  160 mg daily    # Bladder mass  - Plan for outpatient cystoscopy with urology    # Hypernatremia  - Na 150 (12/22). Encourage intake of free water     # Frontal mass of brain s/p left craniotomy for resection of brain mass 12/5  # Glioblastoma  - Management per primary team  - Plan for temozolomide  75 mg/m2 oral daily for 3 weeks with adjuvant focal radiation Monday to Friday for 3 weeks followed by 6 cycles of adjuvant TMZ. Do not expect nephrotoxic effect with this chemo regimen.   - Planned discharge on 12/25    RECOMMENDATIONS:   - Continue envarsus  8 mg daily  - Follow up next tac level to consider further dose adjustment  - Encourage intake of free water    - We will continue to follow.     Delon Garnet, MD  07/29/2024 10:29 AM     Medical decision-making for 07/29/2024  Findings / Data     Patient has: []  acute illness w/systemic sxs  [mod]  []  two or more stable chronic illnesses [mod]  []  one chronic illness with acute exacerbation [mod]  []  acute complicated illness  [mod]  []  Undiagnosed new problem with uncertain prognosis  [mod] [x]  illness posing risk to life or bodily function (ex. AKI)  [high]  []  chronic illness with severe exacerbation/progression  [high]  []  chronic illness with severe side effects of treatment  [high] Kidney transplant recipient, brain mass Probs At least 2:  Probs, Data, Risk   I reviewed: [x]  primary team note  []  consultant note(s)  []  external records [x]  chemistry results  [x]  CBC results  []  blood gas results  []  Other []  procedure/op note(s)   []  radiology report(s)  []  micro result(s)  []  w/ independent historian(s) Cr at baseline, reviewed primary team notes, low tacrolimus  levels   HyperNa >=3 Data Review (2 of 3)    I independently interpreted: []  Urine Sediment  []  Renal US  []  CXR Images  []  CT Images  []  Other []  EKG Tracing  Any     I discussed: []  Pathology results w/ QHPs(s) from other specialties  []  Procedural findings w/ QHPs(s) from other specialties []  Imaging w/ QHP(s) from other specialties  [x]  Treatment plan w/ QHP(s)  from other specialties Plan discussed with primary team, pharmacist  Any     Mgm't requires: []  Prescription drug(s)  [mod]  []  Kidney biopsy  [mod]  []  Central line placement  [mod] [x]  High risk medication use and/or intensive toxicity monitoring [high]  []  Renal replacement therapy [high]  []  High risk kidney biopsy  [high]  []  Escalation of care  [high]  []  High risk central line placement  [high] Immunosuppression: high risk for infection Risk      _____________________________________________________________________________________    Interval:  Doing well and planning discharge later this week.   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 04/2016 and right 12/2016)  Post-Transplant Course: HD once for hyperkalemia early after transplant  Prior Transplants: none  Induction: alemtuzumab  Date of Ureteral Stent Removal: 07/29/2019  CMV and EBV Serologies: CMV D+/R+, EBV D+/R  Rejection Episodes: 01/2022 kidney biopsy showed mild focal tubulitis, patien twas treated with solumedrol 125 mg IV x3 and steroid taper  Donor Specific Antibodies: none     INPATIENT MEDICATIONS:  Current Medications[1]    Physical Exam:   Vitals:    07/28/24 0607 07/28/24 0829 07/28/24 1700 07/29/24 0559   BP: 141/72 115/71 118/78 141/73   Pulse: 50 78 75 55   Resp: 18  17 18    Temp: 36.5 ??C (97.7 ??F)  36.5 ??C (97.7 ??F) 36.6 ??C (97.9 ??F)   TempSrc: Oral  Oral Oral   SpO2: 100%  100% 100%   Weight:       Height:         No intake/output data recorded.    Intake/Output Summary (Last 24 hours) at 07/29/2024 1029  Last data filed at 07/29/2024 0600  Gross per 24 hour   Intake --   Output 625 ml   Net -625 ml     Constitutional: no acute distress  Heart: RRR  Lungs: normal WOB, on room air  Ext: no lower extremity edema    Neuro: awake, alert    Recent Labs     Units 07/24/24  0625 07/28/24  0708   NA mmol/L 144 150*   K mmol/L 5.1* 4.8   CL mmol/L 109* 113*   CO2 mmol/L 18.9* 23.9   BUN mg/dL 32* 28*   CREATININE mg/dL 8.24* 8.31*   GLU mg/dL 879 89   CALCIUM mg/dL 89.9 89.7     Recent Labs     Units 07/24/24  0625 07/28/24  0708   WBC 10*9/L 9.3 6.9   RBC 10*12/L 4.09* 4.06*   HGB g/dL 87.7* 87.8*   HCT % 62.4* 37.6*   MCV fL 91.7 92.7   MCH pg 29.8 29.9   MCHC g/dL 67.5 67.6   RDW % 83.0* 18.3*   PLT 10*9/L 202 152   MPV fL 9.0 8.4     Tac level: 3.5 (12/19); 3.8 (12/22)  [Goal 4-6]           [1]   Current Facility-Administered Medications:     acetaminophen  (TYLENOL ) tablet 650 mg, Oral, Q6H PRN    amlodipine  (NORVASC ) tablet 10 mg, Oral, Daily    atorvastatin  (LIPITOR ) tablet 40 mg, Oral, Daily    bisacodyl (DULCOLAX) suppository 10 mg, Rectal, Daily PRN    calcium carbonate (TUMS) chewable tablet 200  mg elem calcium, Oral, TID PRN    dextrose  50 % in water  (D50W) 50 % solution 12.5 g, Intravenous, Q15 Min PRN    famotidine  (PEPCID ) tablet 20 mg, Oral, Daily    guaiFENesin (ROBITUSSIN) oral syrup, Oral, Q4H PRN    heparin  (porcine) 5,000 unit/mL injection 5,000 Units, Subcutaneous, Q8H SCH    insulin  lispro (HumaLOG ) injection CORRECTIONAL 0-20 Units, Subcutaneous, ACHS    melatonin tablet 3 mg, Oral, Nightly PRN    ondansetron  (ZOFRAN -ODT) disintegrating tablet 4 mg, Oral, Q6H PRN    [START ON 07/30/2024] predniSONE  (DELTASONE ) tablet 5 mg, Oral, Daily    senna (SENOKOT) tablet 2 tablet, Oral, Nightly    tacrolimus  (ENVARSUS  XR) extended release tablet 9 mg, Oral, Daily    valsartan  (DIOVAN ) tablet 160 mg, Oral, Daily

## 2024-07-30 LAB — TACROLIMUS LEVEL, TROUGH: TACROLIMUS, TROUGH: 3.6 ng/mL — ABNORMAL LOW (ref 5.0–15.0)

## 2024-07-30 MED ADMIN — melatonin tablet 3 mg: 3 mg | ORAL | @ 03:00:00

## 2024-07-30 MED ADMIN — heparin (porcine) 5,000 unit/mL injection 5,000 Units: 5000 [IU] | SUBCUTANEOUS | @ 12:00:00

## 2024-07-30 MED ADMIN — heparin (porcine) 5,000 unit/mL injection 5,000 Units: 5000 [IU] | SUBCUTANEOUS | @ 03:00:00

## 2024-07-30 MED ADMIN — heparin (porcine) 5,000 unit/mL injection 5,000 Units: 5000 [IU] | SUBCUTANEOUS | @ 19:00:00

## 2024-07-30 MED ADMIN — atorvastatin (LIPITOR) tablet 40 mg: 40 mg | ORAL | @ 13:00:00

## 2024-07-30 MED ADMIN — predniSONE (DELTASONE) tablet 5 mg: 5 mg | ORAL | @ 13:00:00

## 2024-07-30 MED ADMIN — insulin lispro (HumaLOG) injection CORRECTIONAL 0-20 Units: 0-20 [IU] | SUBCUTANEOUS | @ 03:00:00

## 2024-07-30 MED ADMIN — insulin lispro (HumaLOG) injection CORRECTIONAL 0-20 Units: 0-20 [IU] | SUBCUTANEOUS | @ 23:00:00

## 2024-07-30 MED ADMIN — famotidine (PEPCID) tablet 20 mg: 20 mg | ORAL | @ 13:00:00

## 2024-07-30 MED ADMIN — valsartan (DIOVAN) tablet 160 mg: 160 mg | ORAL | @ 13:00:00

## 2024-07-30 MED ADMIN — tacrolimus (ENVARSUS XR) extended release tablet 1 mg: 1 mg | ORAL | @ 19:00:00 | Stop: 2024-07-30

## 2024-07-30 MED ADMIN — amlodipine (NORVASC) tablet 10 mg: 10 mg | ORAL | @ 13:00:00

## 2024-07-30 MED ADMIN — tacrolimus (ENVARSUS XR) extended release tablet 8 mg: 8 mg | ORAL | @ 13:00:00

## 2024-07-30 NOTE — Consults (Signed)
 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 Kidney allograft function (stable):  - Serum creatinine remains within baseline of approximately 1.7-1.9. No new labs.  - Transplant patients with an open wound require wound care with sterile water  only. The patient should be counseled on this at the time of discharge if they have not already been doing this.    # Immunosuppression:  - Off mycophenolate  in setting of malignancy   - Tac level goal 4-6  - On Envarsus - increased to 8 mg on 12/19 when level 3.5.  - Level 12/22 = 3.8. Continue current dosing (home dosing is 5)  - Given plan for discharge tomorrow, no need for additional level checks. Discharge on this dose, and he will follow up with Dr. [name].  - Once the patient has completed his course of dexamethasone , start prednisone  5 mg daily.    # Blood Pressure / Volume: controlled (SBP 115-147 mmHg)  - Currently on valsartan  160 mg daily    # Bladder mass  - Plan for outpatient cystoscopy with urology    # Hypernatremia  - Na 150 (12/22). Encourage intake of free water     # Frontal mass of brain s/p left craniotomy for resection of brain mass 12/5  # Glioblastoma  - Management per primary team  - Plan for temozolomide  75 mg/m2 oral daily for 3 weeks with adjuvant focal radiation Monday to Friday for 3 weeks followed by 6 cycles of adjuvant TMZ. Do not expect nephrotoxic effect with this chemo regimen.   - Planned discharge on 12/25    RECOMMENDATIONS:   - Continue envarsus  8 mg daily- discharge on this dose  - No further hospital tac levels needed  - Follow up with Dr. [name] in transplant neph post discharge  - Encourage intake of free water    - We will continue to follow.     Frank Garnet, MD  07/30/2024 9:37 AM     Medical decision-making for 07/30/2024  Findings / Data     Patient has: []  acute illness w/systemic sxs  [mod]  []  two or more stable chronic illnesses [mod]  []  one chronic illness with acute exacerbation [mod]  []  acute complicated illness  [mod]  []  Undiagnosed new problem with uncertain prognosis  [mod] [x]  illness posing risk to life or bodily function (ex. AKI)  [high]  []  chronic illness with severe exacerbation/progression  [high]  []  chronic illness with severe side effects of treatment  [high] Kidney transplant recipient, brain mass Probs At least 2:  Probs, Data, Risk   I reviewed: [x]  primary team note  []  consultant note(s)  []  external records [x]  chemistry results  [x]  CBC results  []  blood gas results  []  Other []  procedure/op note(s)   []  radiology report(s)  []  micro result(s)  []  w/ independent historian(s) Cr at baseline, reviewed primary team notes, low tacrolimus  levels   HyperNa >=3 Data Review (2 of 3)    I independently interpreted: []  Urine Sediment  []  Renal US  []  CXR Images  []  CT Images  []  Other []  EKG Tracing  Any     I discussed: []  Pathology results w/ QHPs(s) from other specialties  []  Procedural findings w/  QHPs(s) from other specialties []  Imaging w/ QHP(s) from other specialties  [x]  Treatment plan w/ QHP(s) from other specialties Plan discussed with primary team, pharmacist  Any     Mgm't requires: []  Prescription drug(s)  [mod]  []  Kidney biopsy  [mod]  []  Central line placement  [mod] [x]  High risk medication use and/or intensive toxicity monitoring [high]  []  Renal replacement therapy [high]  []  High risk kidney biopsy  [high]  []  Escalation of care  [high]  []  High risk central line placement  [high] Immunosuppression: high risk for infection Risk      _____________________________________________________________________________________    Interval:  Doing well and planning discharge tomorrow  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 04/2016 and right 12/2016)  Post-Transplant Course: HD once for hyperkalemia early after transplant  Prior Transplants: none  Induction: alemtuzumab  Date of Ureteral Stent Removal: 07/29/2019  CMV and EBV Serologies: CMV D+/R+, EBV D+/R  Rejection Episodes: 01/2022 kidney biopsy showed mild focal tubulitis, patien twas treated with solumedrol 125 mg IV x3 and steroid taper  Donor Specific Antibodies: none     INPATIENT MEDICATIONS:  Current Medications[1]    Physical Exam:   Vitals:    07/28/24 1700 07/29/24 0559 07/29/24 1602 07/30/24 0541   BP: 118/78 141/73 127/66 147/76   Pulse: 75 55 65 56   Resp: 17 18 17 16    Temp: 36.5 ??C (97.7 ??F) 36.6 ??C (97.9 ??F) 36.6 ??C (97.9 ??F) 36.4 ??C (97.5 ??F)   TempSrc: Oral Oral Oral Oral   SpO2: 100% 100% 97% 100%   Weight:       Height:         No intake/output data recorded.    Intake/Output Summary (Last 24 hours) at 07/30/2024 0937  Last data filed at 07/30/2024 0400  Gross per 24 hour   Intake --   Output 450 ml   Net -450 ml     Constitutional: no acute distress  Heart: RRR  Lungs: normal WOB, on room air  Ext: no lower extremity edema    Neuro: awake, alert    Recent Labs     Units 07/24/24  0625 07/28/24  0708   NA mmol/L 144 150*   K mmol/L 5.1* 4.8   CL mmol/L 109* 113*   CO2 mmol/L 18.9* 23.9   BUN mg/dL 32* 28*   CREATININE mg/dL 8.24* 8.31*   GLU mg/dL 879 89   CALCIUM mg/dL 89.9 89.7     Recent Labs     Units 07/24/24  0625 07/28/24  0708   WBC 10*9/L 9.3 6.9   RBC 10*12/L 4.09* 4.06*   HGB g/dL 87.7* 87.8*   HCT % 62.4* 37.6*   MCV fL 91.7 92.7   MCH pg 29.8 29.9   MCHC g/dL 67.5 67.6   RDW % 83.0* 18.3*   PLT 10*9/L 202 152   MPV fL 9.0 8.4     Tac level: 3.5 (12/19); 3.8 (12/22)  [Goal 4-6]             [1]   Current Facility-Administered Medications:     acetaminophen  (TYLENOL ) tablet 650 mg, Oral, Q6H PRN    amlodipine  (NORVASC ) tablet 10 mg, Oral, Daily    atorvastatin  (LIPITOR ) tablet 40 mg, Oral, Daily  bisacodyl (DULCOLAX) suppository 10 mg, Rectal, Daily PRN    calcium carbonate (TUMS) chewable tablet 200 mg elem calcium, Oral, TID PRN    dextrose  50 % in water  (D50W) 50 % solution 12.5 g, Intravenous, Q15 Min PRN    famotidine  (PEPCID ) tablet 20 mg, Oral, Daily    guaiFENesin (ROBITUSSIN) oral syrup, Oral, Q4H PRN    heparin  (porcine) 5,000 unit/mL injection 5,000 Units, Subcutaneous, Q8H SCH    insulin  lispro (HumaLOG ) injection CORRECTIONAL 0-20 Units, Subcutaneous, ACHS    melatonin tablet 3 mg, Oral, Nightly PRN    ondansetron  (ZOFRAN -ODT) disintegrating tablet 4 mg, Oral, Q6H PRN    predniSONE  (DELTASONE ) tablet 5 mg, Oral, Daily    senna (SENOKOT) tablet 2 tablet, Oral, Nightly    tacrolimus  (ENVARSUS  XR) extended release tablet 8 mg, Oral, Daily    valsartan  (DIOVAN ) tablet 160 mg, Oral, Daily

## 2024-07-30 NOTE — Consults (Addendum)
 Tacrolimus  Therapeutic Monitoring Pharmacy Note    Frank Leach is a 73 y.o. male continuing tacrolimus .     Indication: Kidney transplant     Date of Transplant: 06/22/2019      Prior Dosing Information: Envarsus  8 mg PO daily (inc 12/19); 9 mg PO dose on 12/23  FYI: Home regimen Envarsus  5 mg PO daily     Source(s) of information used to determine prior to admission dosing: Patient/Caregiver, Home Medication List, or Clinic Note    Goals:  Therapeutic Drug Levels  Tacrolimus  trough goal: 4-6 ng/mL    Additional Clinical Monitoring/Outcomes  Monitor renal function (SCr and urine output) and liver function (LFTs)  Monitor for signs/symptoms of adverse events (e.g., hyperglycemia, hyperkalemia, hypomagnesemia, hypertension, headache, tremor)    Previous Lab Results:  Tacrolimus , Trough   Date Value Ref Range Status   07/30/2024 3.6 (L) 5.0 - 15.0 ng/mL Final   07/28/2024 3.8 (L) 5.0 - 15.0 ng/mL Final   07/25/2024 3.5 (L) 5.0 - 15.0 ng/mL Final   07/23/2024 3.3 (L) 5.0 - 15.0 ng/mL Final   07/22/2024 2.9 (L) 5.0 - 15.0 ng/mL Final     Creatinine   Date Value Ref Range Status   07/28/2024 1.68 (H) 0.73 - 1.18 mg/dL Final   87/81/7974 8.24 (H) 0.73 - 1.18 mg/dL Final   87/86/7974 8.35 (H) 0.73 - 1.18 mg/dL Final        Result: Tacrolimus  trough was 3.3 ng/mL (drawn appropriately)    Pharmacokinetic Considerations and Significant Drug Interactions:  Concurrent CYP3A4 substrates/inhibitors: amlodipine     Assessment/Plan:  Recommendation(s)  Today's tacrolimus  level is subtherapeutic and trending down from Monday.  Increase to tacrolimus  9 mg PO for today; nephrologist preference to continue 8 mg daily upon discharge and follow up outpatient    Follow-up  MWF levels ordered at 7am.   A pharmacist will continue to monitor and recommend levels as appropriate    Please page service pharmacist with questions/clarifications.    Bradin Mcadory FORBES Loring, PharmD

## 2024-07-30 NOTE — Consults (Signed)
 Endocrine Team Diabetes Follow Up Consult Note     Consult information:  Requesting Attending Physician : Deward Adjutant, MD  Service Requesting Consult : Physical Medicine and Rehabilitation [facility])  Primary Care Provider: Kandis Stefano Houston, MD  Impression:  Frank Leach is a 73 y.o. male admitted for glioblastoma s/p resection. We have been consulted at the request of Deward Adjutant, MD to evaluate Frank Leach for hyperglycemia.     Medical Decision Making:  Diagnoses:  1.Type 2 Diabetes. Uncontrolled With glucoses below inpatient target but without hypoglycemia.  2. Nutrition: Complicating glycemic control. Increasing risk for both hypoglycemia and hyperglycemia.  3. Steroids. Complicating glycemic control and increasing risk for hyperglycemia.  4. Transplant. Complicating glycemic control and increasing risk for hyperglycemia.  5. Chronic Kidney Disease. Complicating glycemic control and increasing risk for hypoglycemia.    Studies reviewed 07/30/2024:  Labs: POCT-BG  Interpretation: Intermittent hyperglycemia.  Notes reviewed: Primary team and nursing notes    Overall impression based on above reviews and history:  Pt with T2DM admitted to rehab s/p resection of glioblastoma.   eGFR reduced at 43 consistent with CKD    Team resume home prednisone  which will be continued maintenance per transplant nephrology.      All scheduled basal and meal insulin  discontinued. Glucose mostly within parameters. Will continue to monitor on sliding scale.     Recommendations:  - Lispro sliding scale 1:50>140 ACHS  - Hypoglycemia protocol.  - POCT-BG achs.  - Ensure patient is on glucose precautions if patient taking nutrition by mouth.     Thank you for this consult. Discussed plan with primary team. We will continue to follow and make recommendations and place orders as appropriate.    Please page with questions or concerns: Ritchie Slater, [place]: [phone]  Endocrinology Diabetes Care Team on call from 6AM - 5PM on weekdays. Virtual assistance is available on the weekends from 6AM-1PM.    Subjective:  Interval History:  No acute concerns at this time.    Initial HPI:  Frank Leach is a 73 y.o. 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  Diabetes is managed by: PCP, nephrologist.  Current home diabetes regimen: metformin  XR 500mg  qAM,empagliflozin  25mg  qam.  - since admission to [facility], patient has been on insulin  inpatient while on steroid taper  Current home blood glucose monitoring:  occasionally.  Hypoglycemia awareness: yes.  Complications related to diabetes: ESRD s/p transplant in 2020      Current Nutrition:  Active Orders   Diet    Nutrition Therapy Regular/House       ROS: As per HPI.    Scheduled Medications[1]    Current Outpatient Medications   Medication Instructions    acetaminophen  (TYLENOL ) 500-1,000 mg, Oral, Every 6 hours PRN    [Paused] allopurinol  (ZYLOPRIM ) 100 mg, Oral, Every evening    amlodipine  (NORVASC ) 10 mg, Oral, Daily (standard)    atorvastatin  (LIPITOR ) 40 mg, Oral, Daily (standard)    blood sugar diagnostic (GLUCOSE BLOOD) Strp Use to check blood sugar 3 times a day and for symptoms of high or low blood sugar.    blood-glucose meter kit Use as instructed    dexAMETHasone  (DECADRON ) 2 mg, Oral, Every 12 hours    ENVARSUS  XR 8 mg, Oral, Daily    famotidine  (  PEPCID ) 20 mg, Oral, Daily (standard)    heparin  (porcine) 5,000 Units, Subcutaneous, Every 8 hours    lancets Misc Use to check blood sugar 3 times a day and for symptoms of high or low blood sugar.    latanoprost (XALATAN) 0.005 % ophthalmic solution 1 drop, Nightly    magnesium  oxide-Mg AA chelate (MAGNESIUM , AMINO ACID CHELATE,) 133 mg 1 tablet, Oral, 2 times a day    metFORMIN  (GLUCOPHAGE -XR) 500 mg, Oral, Daily (RT)    olmesartan  (BENICAR ) 40 mg, Oral, Daily (standard)    ondansetron  (ZOFRAN ) 8 mg, Oral, Every 8 hours PRN, Take 1 tablet 1 hour before the chemotherapy (temozolomide ).    predniSONE  (DELTASONE ) 5 mg, Oral, Daily (standard)    senna (SENOKOT) 8.6 mg tablet 2 tablets, Oral, Nightly    [Paused] temozolomide  (TEMODAR ) 75 mg/m2/day, Oral, Daily (standard)         Past Medical History[2]    Past Surgical History[3]    Family History[4]    Short Social History[5]    OBJECTIVE:  BP 147/76  - Pulse 56  - Temp 36.4 ??C (97.5 ??F) (Oral)  - Resp 16  - Ht 170.2 cm (5' 7)  - Wt 70.2 kg (154 lb 12.2 oz)  - SpO2 100%  - BMI 24.24 kg/m??   Wt Readings from Last 12 Encounters:   07/22/24 70.2 kg (154 lb 12.2 oz)   07/13/24 73 kg (160 lb 15 oz)   07/03/24 72.6 kg (160 lb)   01/18/24 73.2 kg (161 lb 6.4 oz)   06/08/23 68.9 kg (151 lb 12.8 oz)   03/22/23 68.3 kg (150 lb 8 oz)   02/14/23 68.4 kg (150 lb 12.8 oz)   12/21/22 68 kg (149 lb 14.4 oz)   11/27/22 67.6 kg (149 lb)   11/02/22 68.1 kg (150 lb 3.2 oz)   10/13/22 68.2 kg (150 lb 6.4 oz)   06/02/22 67 kg (147 lb 9.6 oz)     Pt working with PT/OT  Physical Exam  Nursing note reviewed.   Constitutional:       General: He is not in acute distress.  HENT:      Head: Normocephalic and atraumatic.   Pulmonary:      Effort: Pulmonary effort is normal. No respiratory distress.   Skin:     General: Skin is warm.   Neurological:      Mental Status: He is alert and oriented to person, place, and time.           BG/insulin  reviewed per EMR.   Glucose, POC   Date Value   07/29/2024 170 mg/dL   87/76/7974 776 mg/dL (H)   87/76/7974 877 mg/dL   87/76/7974 87 mg/dL   87/77/7974 776 mg/dL (H)   87/77/7974 824 mg/dL   87/77/7974 95 mg/dL   87/77/7974 76 mg/dL   89/76/7986 784 MG/DL (H)   90/74/7986 55 MG/DL (L)   90/74/7986 52 MG/DL (L)   93/78/7986 96 MG/DL   93/78/7986 844 MG/DL   93/78/7986 877 MG/DL   93/79/7986 768 MG/DL (H)   93/79/7986 91 MG/DL        Summary of labs:  Lab Results   Component Value Date A1C 6.8 (H) 07/03/2024    A1C 6.2 (H) 01/07/2024    A1C 6.0 (H) 09/10/2023     Lab Results   Component Value Date    GFR 5.11 (L) 05/01/2012    CREATININE 1.68 (H) 07/28/2024     Lab Results  Component Value Date    WBC 6.9 07/28/2024    HGB 12.1 (L) 07/28/2024    HCT 37.6 (L) 07/28/2024    PLT 152 07/28/2024       Lab Results   Component Value Date    NA 150 (H) 07/28/2024    K 4.8 07/28/2024    CL 113 (H) 07/28/2024    CO2 23.9 07/28/2024    BUN 28 (H) 07/28/2024    CREATININE 1.68 (H) 07/28/2024    GLU 89 07/28/2024    CALCIUM 10.2 07/28/2024    MG 2.0 07/14/2024    PHOS 2.9 07/19/2024       Lab Results   Component Value Date    BILITOT 0.2 (L) 07/03/2024    BILIDIR 0.20 12/10/2023    PROT 7.2 07/03/2024    ALBUMIN 2.9 (L) 07/19/2024    ALT 17 07/03/2024    AST 20 07/03/2024    ALKPHOS 78 07/03/2024    GGT 20 06/21/2019                           [1]    amlodipine   10 mg Oral Daily    atorvastatin   40 mg Oral Daily    famotidine   20 mg Oral Daily    heparin  (porcine) for subcutaneous use  5,000 Units Subcutaneous Q8H SCH    insulin  lispro  0-20 Units Subcutaneous ACHS    predniSONE   5 mg Oral Daily    senna  2 tablet Oral Nightly    tacrolimus   8 mg Oral Daily    valsartan   160 mg Oral Daily   [2]   Past Medical History:  Diagnosis Date    Anemia     Cancer    (CMS-HCC)     clear cell adenocarcinoma of left kidney    Diabetes mellitus (CMS-HCC)     Diabetic nephropathy (CMS-HCC)     ESRD (end stage renal disease) on dialysis    (CMS-HCC)     ESRD on dialysis (CMS-HCC)     Gout     Hyperlipidemia     Hypertension    [3]   Past Surgical History:  Procedure Laterality Date    AV FISTULA PLACEMENT      PR COLONOSCOPY FLX DX W/COLLJ SPEC WHEN PFRMD N/A 04/30/2018    Procedure: COLONOSCOPY, FLEXIBLE, PROXIMAL TO SPLENIC FLEXURE; DIAGNOSTIC, W/WO COLLECTION SPECIMEN BY BRUSH OR WASH;  Surgeon: Dorn Lynwood Lauth, MD;  [facility];  Service: Gastroenterology    PR COLONOSCOPY W/BIOPSY SINGLE/MULTIPLE N/A 04/30/2018    Procedure: COLONOSCOPY, FLEXIBLE, PROXIMAL TO SPLENIC FLEXURE; WITH BIOPSY, SINGLE OR MULTIPLE;  Surgeon: Dorn Lynwood Lauth, MD;  Location: [facility]ED[facility];  Service: Gastroenterology    PR COLSC FLX W/RMVL OF TUMOR POLYP LESION SNARE TQ N/A 04/30/2018    Procedure: COLONOSCOPY FLEX; W/REMOV TUMOR/LES BY SNARE;  Surgeon: Dorn Lynwood Lauth, MD;  Location: [facility] PROCED[facility];  Service: Gastroenterology    PR EXCIS SUPRATENT BRAIN TUMOR Left 07/11/2024    Procedure: Left craniotomy for resection of mass;  Surgeon: Zettie Rosaria HERO, MD;  Location: OR [facility];  Service: Neurosurgery    PR EXPLORATORY OF ABDOMEN Midline 04/14/2016    Procedure: EXPLORATORY LAPAROTOMY, EXPLORATORY CELIOTOMY WITH OR WITHOUT BIOPSY(S);  Surgeon: Marsa Sam Boss, MD;  Location: MAIN OR [facility];  Service: Transplant    PR EXPLORATORY OF ABDOMEN N/A 04/16/2016    Procedure: EXPLORATORY LAPAROTOMY,  EXPLORATORY CELIOTOMY WITH OR WITHOUT BIOPSY(S);  Surgeon: Marsa Sam Boss, MD;  Location: MAIN OR [facility];  Service: Transplant    PR IONM 1 ON 1 IN OR W/ATTENDANCE EACH 15 MINUTES N/A 07/11/2024    Procedure: CONTINUOUS INTRAOPERATIVE NEUROPHYSIOLOGY MONITORING IN OR;  Surgeon: Zettie Rosaria HERO, MD;  Location: OR [facility];  Service: Neurosurgery    PR LAP, RADICAL NEPHRECTOMY Right 12/27/2016    Procedure: Robotic Xi Laparoscopy; Radical Nephrectomy (Incl Remove Gerota`S Fascia, Fatty Tissue, Reg Lymph Node, Adrenalectomy);  Surgeon: Clorinda Ole Chalk, MD;  Location: MAIN OR [facility];  Service: Urology    PR LIGATN ANGIOACCESS AV FISTULA Left 11/29/2022    Procedure: LIGATION OR BANDING OF ANGIOACCESS ARTERIOVENOUS FISTULA;  Surgeon: [place], Marsa Sam, MD;  Location: MAIN OR [facility];  Service: Transplant    PR MICROSURG TECHNIQUES,REQ OPER MICROSCOPE N/A 07/11/2024    Procedure: MICROSURGICAL TECHNIQUES, REQUIRING USE OF OPERATING MICROSCOPE (LIST SEPARATELY IN ADDITION TO CODE FOR PRIMARY PROCEDURE);  Surgeon: Zettie Rosaria HERO, MD;  Location: OR [facility];  Service: Neurosurgery    PR NEGATIVE PRESSURE WOUND THERAPY DME >50 SQ CM N/A 04/12/2016    Procedure: NEG PRESS WOUND TX (VAC ASSIST) INCL TOPICALS, PER SESSION, TSA GREATER THAN/= 50 CM SQUARED;  Surgeon: Marsa Sam Boss, MD;  Location: MAIN OR [place];  Service: Transplant    PR NEGATIVE PRESSURE WOUND THERAPY DME >50 SQ CM Bilateral 04/14/2016    Procedure: NEG PRESS WOUND TX (VAC ASSIST) INCL TOPICALS, PER SESSION, TSA GREATER THAN/= 50 CM SQUARED;  Surgeon: Marsa Sam Boss, MD;  Location: MAIN OR [place];  Service: Transplant    PR NEGATIVE PRESSURE WOUND THERAPY DME >50 SQ CM N/A 04/16/2016    Procedure: NEG PRESS WOUND TX (VAC ASSIST) INCL TOPICALS, PER SESSION, TSA GREATER THAN/= 50 CM SQUARED;  Surgeon: Marsa Sam Boss, MD;  Location: MAIN OR [facility];  Service: Transplant    PR NEPHRECTOMY, W/PART. URETECTOMY Bilateral 04/11/2016    Procedure: LAPAROSCOPY, SURGICAL, NEPHRECTOMY WITH TOTAL URETERECTOMY;  Surgeon: Marsa Sam Boss, MD;  Location: MAIN OR [facility];  Service: Transplant    PR REMV KIDNEY,W/RIB RESECTION Bilateral 04/11/2016    Procedure: NEPHRECTOMY, INCLUDING PARTIAL URETERECTOMY, ANY OPEN APPROACH INCLUDING RIB RESECTION;  Surgeon: Marsa Sam Boss, MD;  Location: MAIN OR [facility];  Service: Transplant    PR STEREOTACTIC COMP ASSIST PROC,CRANIAL,INTRADURAL N/A 07/11/2024    Procedure: STEREOTACTIC COMPUTER-ASSISTED (NAVIGATIONAL) PROCEDURE; CRANIAL, INTRADURAL;  Surgeon: Zettie Rosaria HERO, MD;  Location: OR [facility];  Service: Neurosurgery    PR TRANSPLANT,PREP LIVING  RENAL GRAFT N/A 06/22/2019    Procedure: [place] Day STD PREP LIVING DONOR RENAL ALLGRFT (OPEN/LAPROSC) PRIOR TO TRANSPLANT, INC DISSECT/REM AS NECESS;  Surgeon: Marsa Sam Boss, MD;  Location: MAIN OR [facility];  Service: Transplant    PR TRANSPLANTATION OF KIDNEY N/A 06/22/2019    Procedure: RENAL ALLOTRANSPLANTATION, IMPLANTATION OF GRAFT; WITHOUT RECIPIENT NEPHRECTOMY;  Surgeon: Marsa Sam Boss, MD;  Location: MAIN OR [facility];  Service: Transplant    SPLENECTOMY     [4]   Family History  Problem Relation Age of Onset    Kidney disease Mother     Cancer Father     Kidney disease Brother    [5]   Social History  Tobacco Use    Smoking status: Never    Smokeless tobacco: Never   Vaping Use    Vaping status: Never Used   Substance Use Topics    Alcohol use: No    Drug use: No

## 2024-07-30 NOTE — Progress Notes (Signed)
 Physical Medicine and Rehabilitation  Daily Progress Note [facility]    ASSESSMENT:     Frank Leach is a 73 y.o. male admitted to [facility] for glioblastoma status post resection.    Rehab Impairment Group Code [facility]):  (Brain Dysfunction) 02.1 Non-Traumatic   Etiology: Glioblastoma    PLAN:     This patient is admitted to the Physical Medicine and Rehabilitation - Inpatient - D service from 8am-5pm on weekdays for questions regarding this patient. After hours, weekends, and holidays please contact the 1st Call resident pager       REHAB:   - PT and OT to maximize functional status with mobility and ADLs as well as prevention of joint contracture.   - SLP for cognitive and swallow function.  - Neuropsych for higher level cognitive evaluation and coping.  - RT for community re-integration, education, and leisure support services.  - To be discussed in weekly Interdisciplinary Team Conference.     Glioblastoma   Procedure date: 07/11/24; Dr. Zettie  Presented with two weeks of gait imbalance and right sided weakness. Found to have a frontoparietal lesion now status post resection with neurosurgery. Completed post-operative Keppra  prophylaxis during acute admission. Pathology consistent with glioblastoma. Neuro-oncology and radiation oncology consulted during acute admission and plan for concurrent radiation and temozolomide  chemotherapy. Completed steroid taper 12/23. Removed staples per neurosurgery on 12/19 without issue.   - famotidine  20mg  daily   - Tylenol  650mg  Q6hrs PRN  [x]  neurosurgery follow up; 08/12/24 Dr. Zettie  [x]  radiation oncology follow up; plan for RT to start 12/30 (outpatient)  [x]  neuro-oncology follow up; plan to start temozolomide  (new start) at the time of radiation therapy; pending MAP approval for financial assistance     HTN  - amlodipine  10mg  daily   - atorvastatin  40mg  daily   - valsartan  160mg  daily      S/p DDKT  Baseline creatining 1.7-1.9; stable. Off mycophenolate  in the setting of malignancy as above. Continued on tacrolimus .  - tacrolimus  9mg  daily; pharmacy & nephrology assisting with dosing  - tacrolimus  trough; goal level 4-6 ng/mL  - prednisone  5mg  daily; started 12/24   [x]  nephrology consult      T2DM  Last Hgb A1c: 6.8 (07/03/24)  Required increased insulin  needs secondary to high dose steroids as above. Planning to discharge home on oral prednisone . Endocrinology consulted and assisted with management of insulin  needs. Discontinued long acting and meal time insulin  12/22.   - SSI  [x]  endocrinology consult    DME  - Wheelchair: The patient has mobility limitations that interfere with performing ADLs and MADLs (a wheelchair will improve this), mobility limitations that cannot be corrected with a cane or walker. Patient has expressed a willingness to use a wheelchair and has adequate space in their living environment for a wheelchair. Patient has a caregiver who is willing and available to assist patient w/ wheelchair transport.       Daily Checklist  - Diet: regular  - DVT PPX: SQ heparin   - GI PPX: famotidine      DISPO: Patient to be discussed at weekly interdisciplinary team conference.   - EDD: 07/31/24  - Follow-up: PCP, PM&R, neurosurgery, neuro oncology, radiation oncology    SUBJECTIVE and PROGRESS WITH FUNCTIONAL ACTIVITIES:     Interval Events:   NAEO. BG [id number]. Discussed with patient that he'll be going home without insulin  as his needs will likely change next week depending on the steroid plan with his radiation treatments. Patient noted he  was receiving his tacrolimus  through [facility] shared services and didn't have a copay. Will discuss with pharmacy.     OBJECTIVE:     Vital signs (last 24 hours):  Temp:  [36.4 ??C (97.5 ??F)-36.6 ??C (97.9 ??F)] 36.4 ??C (97.5 ??F)  Pulse:  [56-65] 56  Resp:  [16-17] 16  BP: (127-147)/(66-76) 147/76  MAP (mmHg):  [84-98] 98  SpO2:  [97 %-100 %] 100 %    Intake/Output (last 3 shifts):  I/O last 3 completed shifts:  In: -   Out: 1075 [Urine:1075]    Physical Exam:   GEN: sitting in recliner having just finished therapy, NAD  HEENT: sclera anicteric, conjunctiva clear, MMM, trachea midline, post surgical appearance  CV: warm extremities, well perfused, no peripheral edema  RESP:  NWOB on RA  ABD: not distended  SKIN: scalp incision clean, dry, intact, no erythema or bleeding noted  MSK: no notable contractures or joint swelling  NEURO: awake and alert, responds appropriately to questions   PSYCH: mood lifted, affect appropriate, conversationally pleasant    Medications:  Scheduled Scheduled meds with Route[1]  PRN acetaminophen , 650 mg, Q6H PRN  bisacodyl, 10 mg, Daily PRN  calcium carbonate, 200 mg elem calcium, TID PRN  dextrose  in water , 12.5 g, Q15 Min PRN  guaiFENesin, 100 mg, Q4H PRN  melatonin, 3 mg, Nightly PRN  ondansetron , 4 mg, Q6H PRN        Labs/Studies: Reviewed.    Radiology Results: Reviewed    Quality Indicators      Hearing, Speech, and Vision  Ability to Hear: Adequate  Ability to See in Adequate Light: Adequate  Expression of Ideas and Wants: Without difficulty  Understanding Verbal and Non-Verbal Content: Understands    Cognitive Pattern Assessment  Cognitive Pattern Assessment Used: BIMS  Brief Interview for Mental Status (BIMS)  Repetition of Three Words (First Attempt): 3  Temporal Orientation: Year: Correct  Temporal Orientation: Month: Accurate within 5 days  Temporal Orientation: Day: Correct  Recall: Sock: Yes, no cue required  Recall: Blue: Yes, no cue required  Recall: Bed: Yes, no cue required  BIMS Summary Score: 15       Nutritional Approaches  Nutritional Approach: None of the above    ADLs  Admission Current   Eating Assistance Needed: Set-up / clean-up    CARE Score - 5 Assistance Needed: Set-up / clean-up    CARE Score - 5   Oral Hygiene  Assistance Needed: Physical assistance 25% or less  CARE Score - 3  Assistance Needed: Physical assistance    CARE Score - 3   Bladder Continence       Bowel Continence Toileting Hygiene Assistance Needed: Physical assistance 26%-50%  CARE Score - 3 Assistance Needed: Physical assistance    CARE Score - 3    Toilet Transfer Assistance Needed: Physical assistance 25% or less  CARE Score - 3 Assistance Needed: Physical assistance    CARE Score - 3   Shower/Bathe Self Assistance Needed: Physical assistance 25% or less  CARE Score - 3  Assistance Needed: Incidental touching    CARE Score - 4   Upper Body Dressing Assistance Needed: Physical assistance 26%-50%  CARE Score - 3  Assistance Needed: Set-up / clean-up, Supervision    CARE Score - 4   Lower Body Dressing Assistance Needed: Physical assistance 26%-50%  CARE Score - 3  Assistance Needed: Physical assistance    CARE Score - 3   On/Off Footwear Assistance Needed: Physical assistance 26%-50%  CARE Score - 3  Assistance Needed: Physical assistance    CARE Score - 3         Transfers Admission Current   Bed to Chair Assistance Needed: Incidental touching, Adaptive equipment    CARE Score - 88  Assistance Needed: Incidental touching, Adaptive equipment    CARE Score - 4     Lying to Sitting Assistance Needed: Supervision    CARE Score - 88  Assistance Needed: Supervision    CARE Score - 4   Roll Left/Right Assistance Needed: Supervision    CARE Score - 88  Assistance Needed: Supervision    CARE Score - 4   Sit to Lying      CARE Score - 88       CARE Score - 88   Sit to Stand Assistance Needed: Physical assistance 25% or less  CARE Score - 88  Assistance Needed: Physical assistance    CARE Score - 3         Mobility Admission Current   Walk 10 Feet Assistance Needed: Physical assistance 25% or less  CARE Score - 88 Assistance Needed: Physical assistance    CARE Score - 3     Walk 50 Feet 2 Turns      CARE Score - 88      CARE Score - 88   Walk 150 Feet      CARE Score - 88       CARE Score - 88   Walk 10 Feet Uneven      CARE Score - 88       CARE Score - 88   1 Step (Curb)      CARE Score - 88       CARE Score - 88   4 Steps CARE Score - 88       CARE Score - 88   12 Steps      CARE Score - 88       CARE Score - 88   Picking Up Object      CARE Score - 88       CARE Score - 88   Wheelchair/Scooter Use        Wheel 50 Feet 2 Turns Assistance Needed: Physical assistance Total assistance    CARE Score - Wheel 50 Feet with Two Turns: 1    Type of Wheelchair/Scooter: Manual Assistance Needed: Physical assistance      CARE Score - Wheel 50 Feet with Two Turns: 1    Type of Wheelchair/Scooter: Manual   Wheel 150 Feet Assistance Needed: Physical assistance Total assistance    CARE Score - Wheel 150 Feet: 1    Type of Wheelchair/Scooter: Manual  Assistance Needed: Physical assistance      CARE Score - Wheel 150 Feet: 1    Type of Wheelchair/Scooter: Manual                            [1]    amlodipine  (NORVASC ) tablet 10 mg Daily    atorvastatin  (LIPITOR ) tablet 40 mg Daily    famotidine  (PEPCID ) tablet 20 mg Daily    heparin  (porcine) 5,000 unit/mL injection 5,000 Units Q8H SCH    insulin  lispro (HumaLOG ) injection CORRECTIONAL 0-20 Units ACHS    predniSONE  (DELTASONE ) tablet 5 mg Daily    senna (SENOKOT) tablet 2 tablet Nightly    tacrolimus  (ENVARSUS  XR) extended release tablet 8 mg Daily  valsartan  (DIOVAN ) tablet 160 mg Daily

## 2024-07-30 NOTE — Plan of Care (Signed)
 Shift Summary  Blood glucose was elevated and correctional insulin  was administered as ordered.   Melatonin was given to support sleep, and sleep/rest interventions were consistently implemented.   Aspiration protocol maintained. Takes meds with applesauce.   Fall prevention strategies and safety interventions were maintained throughout the shift, with no reported falls.   Infection prevention protocols were followed, including hand hygiene and PPE use.   Overall, comfort and safety were supported, and no new illness or injury was documented.

## 2024-07-30 NOTE — Plan of Care (Signed)
 Shift Summary  Discharge planning advanced with home health and family training recommendations, and patient expressed readiness for transition home tomorrow.   Pain remained well controlled with no complaints throughout the shift, and comfort measures were maintained.   Mobility and therapeutic activities were performed with ongoing assistance, and patient demonstrated ability to turn self in bed.   Infection prevention and safety interventions were consistently implemented, including hand hygiene, PPE, and fall reduction strategies.   Overall, patient maintained stable activity tolerance and made progress toward discharge goals during the shift.    Absence of New-Onset Illness or Injury: No new injuries or illnesses were documented during the shift, and infection prevention measures such as hand hygiene and PPE use were maintained throughout the day.    Optimal Comfort and Wellbeing: Pain was consistently reported as 0 and no complaints of pain were noted; comfort interventions included use of pillows and protective footwear, and activity tolerance remained stable.    Home and Community Transition Plan Established: Discharge planning progressed with recommendations for home health and family training, and no barriers to discharge were identified; patient expressed excitement for discharge home tomorrow.    Rounds/Family Conference: Family was present at bedside during the shift and environmental modifications were made to support safety and comfort.    Improved Ability to Complete Activities of Daily Living: Therapeutic activities and mobility interventions were performed, with continued assistance required for transfers and ambulation, but patient was able to turn self and tolerated treatments well.

## 2024-07-31 LAB — BASIC METABOLIC PANEL
ANION GAP: 15 mmol/L — ABNORMAL HIGH (ref 5–14)
BLOOD UREA NITROGEN: 33 mg/dL — ABNORMAL HIGH (ref 9–23)
BUN / CREAT RATIO: 19
CALCIUM: 10.1 mg/dL (ref 8.7–10.4)
CHLORIDE: 109 mmol/L — ABNORMAL HIGH (ref 98–107)
CO2: 25.8 mmol/L (ref 20.0–31.0)
CREATININE: 1.74 mg/dL — ABNORMAL HIGH (ref 0.73–1.18)
EGFR CKD-EPI (2021) MALE: 41 mL/min/1.73m2 — ABNORMAL LOW (ref >=60)
GLUCOSE RANDOM: 89 mg/dL (ref 70–179)
POTASSIUM: 4.6 mmol/L (ref 3.5–5.1)
SODIUM: 150 mmol/L — ABNORMAL HIGH (ref 135–145)

## 2024-07-31 LAB — CBC
HEMATOCRIT: 36 % — ABNORMAL LOW (ref 39.0–48.0)
HEMOGLOBIN: 11.7 g/dL — ABNORMAL LOW (ref 12.9–16.5)
MEAN CORPUSCULAR HEMOGLOBIN CONC: 32.4 g/dL (ref 32.0–36.0)
MEAN CORPUSCULAR HEMOGLOBIN: 29.6 pg (ref 25.9–32.4)
MEAN CORPUSCULAR VOLUME: 91.6 fL (ref 77.6–95.7)
MEAN PLATELET VOLUME: 8.4 fL (ref 6.8–10.7)
PLATELET COUNT: 113 10*9/L — ABNORMAL LOW (ref 150–450)
RED BLOOD CELL COUNT: 3.93 10*12/L — ABNORMAL LOW (ref 4.26–5.60)
RED CELL DISTRIBUTION WIDTH: 17.6 % — ABNORMAL HIGH (ref 12.2–15.2)
WBC ADJUSTED: 6.2 10*9/L (ref 3.6–11.2)

## 2024-07-31 MED ADMIN — melatonin tablet 3 mg: 3 mg | ORAL | @ 03:00:00

## 2024-07-31 MED ADMIN — heparin (porcine) 5,000 unit/mL injection 5,000 Units: 5000 [IU] | SUBCUTANEOUS | @ 12:00:00 | Stop: 2024-07-31

## 2024-07-31 MED ADMIN — heparin (porcine) 5,000 unit/mL injection 5,000 Units: 5000 [IU] | SUBCUTANEOUS | @ 03:00:00

## 2024-07-31 MED ADMIN — atorvastatin (LIPITOR) tablet 40 mg: 40 mg | ORAL | @ 14:00:00 | Stop: 2024-07-31

## 2024-07-31 MED ADMIN — predniSONE (DELTASONE) tablet 5 mg: 5 mg | ORAL | @ 14:00:00 | Stop: 2024-07-31

## 2024-07-31 MED ADMIN — insulin lispro (HumaLOG) injection CORRECTIONAL 0-20 Units: 0-20 [IU] | SUBCUTANEOUS | @ 03:00:00

## 2024-07-31 MED ADMIN — famotidine (PEPCID) tablet 20 mg: 20 mg | ORAL | @ 14:00:00 | Stop: 2024-07-31

## 2024-07-31 MED ADMIN — valsartan (DIOVAN) tablet 160 mg: 160 mg | ORAL | @ 14:00:00 | Stop: 2024-07-31

## 2024-07-31 MED ADMIN — amlodipine (NORVASC) tablet 10 mg: 10 mg | ORAL | @ 14:00:00 | Stop: 2024-07-31

## 2024-07-31 MED ADMIN — tacrolimus (ENVARSUS XR) extended release tablet 8 mg: 8 mg | ORAL | @ 14:00:00 | Stop: 2024-07-31

## 2024-07-31 NOTE — Plan of Care (Signed)
 Shift Summary  Correctional insulin  was calculated for elevated blood glucose and melatonin was given PRN for sleep.   Senna was refused during the shift.   No falls, injuries, or acute changes were documented.   Overall, remained comfortable, safe, and free from new-onset illness or injury during the shift.     Absence of New-Onset Illness or Injury: Remained alert and oriented throughout the shift with no changes in neurological status, and infection prevention measures such as hand hygiene were consistently promoted. No new injuries or acute illnesses were documented during the shift.     Optimal Comfort and Wellbeing: Reported pain level remained at 0 and no pain interventions were required; melatonin was administered PRN for sleep.     Improved Ability to Complete Activities of Daily Living: Demonstrated slightly limited mobility but was able to turn independently; required safety devices for transfers.     Absence of Fall and Fall-Related Injury: Fall reduction program and safety interventions were maintained, including bed in lowest position, side rails up, call light within reach; no falls or injuries occurred.

## 2024-07-31 NOTE — Plan of Care (Signed)
 Problem: Rehabilitation (IRF) Plan of Care  Goal: Absence of New-Onset Illness or Injury  Outcome: Discharged to Home  Goal: Optimal Comfort and Wellbeing  Outcome: Discharged to Home  Goal: Home and Community Transition Plan Established  Outcome: Discharged to Home  Goal: Rounds/Family Conference  Outcome: Discharged to Home     Problem: Self-Care Deficit  Goal: Improved Ability to Complete Activities of Daily Living  Outcome: Discharged to Home     Problem: Wound  Goal: Optimal Coping  Outcome: Discharged to Home  Goal: Optimal Functional Ability  Outcome: Discharged to Home  Goal: Absence of Infection Signs and Symptoms  Outcome: Discharged to Home  Goal: Improved Oral Intake  Outcome: Discharged to Home  Goal: Optimal Pain Control and Function  Outcome: Discharged to Home  Goal: Skin Health and Integrity  Outcome: Discharged to Home  Goal: Optimal Wound Healing  Outcome: Discharged to Home

## 2024-08-01 NOTE — Telephone Encounter (Signed)
 LVM and will send MyChart message.    Annye Merles PharmD, BCOP, CPP  Neuro-Oncology Clinic Pharmacist

## 2024-08-04 DIAGNOSIS — Z940 Kidney transplant status: Principal | ICD-10-CM

## 2024-08-04 DIAGNOSIS — C719 Malignant neoplasm of brain, unspecified: Principal | ICD-10-CM

## 2024-08-04 MED ORDER — ATORVASTATIN 40 MG TABLET
ORAL_TABLET | Freq: Every day | ORAL | 3 refills | 90.00000 days
Start: 2024-08-04 — End: 2025-08-04

## 2024-08-04 NOTE — Telephone Encounter (Signed)
 LVM

## 2024-08-05 MED ORDER — ATORVASTATIN 40 MG TABLET
ORAL_TABLET | Freq: Every day | ORAL | 3 refills | 90.00000 days | Status: CP
Start: 2024-08-05 — End: 2025-08-05
  Filled 2024-08-06: qty 90, 90d supply, fill #0

## 2024-08-05 NOTE — Telephone Encounter (Signed)
 The patient is requesting a medication refill

## 2024-08-06 DIAGNOSIS — C719 Malignant neoplasm of brain, unspecified: Principal | ICD-10-CM

## 2024-08-06 LAB — COMPREHENSIVE METABOLIC PANEL
ALBUMIN: 3.6 g/dL (ref 3.4–5.0)
ALKALINE PHOSPHATASE: 77 U/L (ref 46–116)
ALT (SGPT): 48 U/L (ref 10–49)
ANION GAP: 15 mmol/L — ABNORMAL HIGH (ref 5–14)
AST (SGOT): 21 U/L (ref <=34)
BILIRUBIN TOTAL: 0.4 mg/dL (ref 0.3–1.2)
BLOOD UREA NITROGEN: 29 mg/dL — ABNORMAL HIGH (ref 9–23)
BUN / CREAT RATIO: 17
CALCIUM: 10.8 mg/dL — ABNORMAL HIGH (ref 8.7–10.4)
CHLORIDE: 109 mmol/L — ABNORMAL HIGH (ref 98–107)
CO2: 23.9 mmol/L (ref 20.0–31.0)
CREATININE: 1.72 mg/dL — ABNORMAL HIGH (ref 0.73–1.18)
EGFR CKD-EPI (2021) MALE: 42 mL/min/1.73m2 — ABNORMAL LOW (ref >=60)
GLUCOSE RANDOM: 139 mg/dL (ref 70–179)
POTASSIUM: 4.8 mmol/L (ref 3.4–4.8)
PROTEIN TOTAL: 6.8 g/dL (ref 5.7–8.2)
SODIUM: 148 mmol/L — ABNORMAL HIGH (ref 135–145)

## 2024-08-06 LAB — PHOSPHORUS: PHOSPHORUS: 2.8 mg/dL (ref 2.4–5.1)

## 2024-08-06 LAB — SLIDE REVIEW

## 2024-08-06 LAB — CBC W/ AUTO DIFF
BASOPHILS ABSOLUTE COUNT: 0 10*9/L (ref 0.0–0.1)
BASOPHILS RELATIVE PERCENT: 0.7 %
EOSINOPHILS ABSOLUTE COUNT: 0.2 10*9/L (ref 0.0–0.5)
EOSINOPHILS RELATIVE PERCENT: 3.7 %
HEMATOCRIT: 39.1 % (ref 39.0–48.0)
HEMOGLOBIN: 12.8 g/dL — ABNORMAL LOW (ref 12.9–16.5)
LYMPHOCYTES ABSOLUTE COUNT: 1.3 10*9/L (ref 1.1–3.6)
LYMPHOCYTES RELATIVE PERCENT: 20.6 %
MEAN CORPUSCULAR HEMOGLOBIN CONC: 32.7 g/dL (ref 32.0–36.0)
MEAN CORPUSCULAR HEMOGLOBIN: 30.4 pg (ref 25.9–32.4)
MEAN CORPUSCULAR VOLUME: 92.9 fL (ref 77.6–95.7)
MEAN PLATELET VOLUME: 8.1 fL (ref 6.8–10.7)
MONOCYTES ABSOLUTE COUNT: 0.7 10*9/L (ref 0.3–0.8)
MONOCYTES RELATIVE PERCENT: 11.9 %
NEUTROPHILS ABSOLUTE COUNT: 3.9 10*9/L (ref 1.8–7.8)
NEUTROPHILS RELATIVE PERCENT: 63.1 %
NUCLEATED RED BLOOD CELLS: 0 /100{WBCs} (ref <=4)
PLATELET COUNT: 131 10*9/L — ABNORMAL LOW (ref 150–450)
RED BLOOD CELL COUNT: 4.2 10*12/L — ABNORMAL LOW (ref 4.26–5.60)
RED CELL DISTRIBUTION WIDTH: 17.2 % — ABNORMAL HIGH (ref 12.2–15.2)
WBC ADJUSTED: 6.2 10*9/L (ref 3.6–11.2)

## 2024-08-06 LAB — MAGNESIUM: MAGNESIUM: 1.5 mg/dL — ABNORMAL LOW (ref 1.6–2.6)

## 2024-08-06 LAB — TACROLIMUS LEVEL, TROUGH: TACROLIMUS, TROUGH: 4.2 ng/mL — ABNORMAL LOW (ref 5.0–15.0)

## 2024-08-06 MED ORDER — ONDANSETRON HCL 8 MG TABLET
ORAL_TABLET | Freq: Three times a day (TID) | ORAL | 1 refills | 7.00000 days | Status: CP | PRN
Start: 2024-08-06 — End: 2025-08-06

## 2024-08-06 MED FILL — AMLODIPINE 10 MG TABLET: ORAL | 90 days supply | Qty: 90 | Fill #2

## 2024-08-06 MED FILL — MG-PLUS-PROTEIN 133 MG TABLET: ORAL | 50 days supply | Qty: 100 | Fill #1

## 2024-08-07 LAB — CMV DNA, QUANTITATIVE, PCR: CMV VIRAL LD: NOT DETECTED

## 2024-08-08 ENCOUNTER — Ambulatory Visit: Admit: 2024-08-08 | Payer: Medicare (Managed Care)

## 2024-08-08 ENCOUNTER — Encounter: Admit: 2024-08-08 | Payer: Medicare (Managed Care) | Attending: Radiation Oncology | Primary: Radiation Oncology

## 2024-08-08 ENCOUNTER — Encounter: Admit: 2024-08-08 | Payer: BLUE CROSS/BLUE SHIELD | Attending: Radiation Oncology | Primary: Radiation Oncology

## 2024-08-08 ENCOUNTER — Ambulatory Visit: Admit: 2024-08-08 | Payer: BLUE CROSS/BLUE SHIELD

## 2024-08-11 DIAGNOSIS — G9389 Other specified disorders of brain: Principal | ICD-10-CM

## 2024-08-11 MED ORDER — ENVARSUS XR 4 MG TABLET,EXTENDED RELEASE
ORAL_TABLET | ORAL | 0 refills | 0.00000 days | Status: CP
Start: 2024-08-11 — End: ?

## 2024-08-12 DIAGNOSIS — R29898 Other symptoms and signs involving the musculoskeletal system: Principal | ICD-10-CM

## 2024-08-13 ENCOUNTER — Ambulatory Visit: Admit: 2024-08-13 | Discharge: 2024-08-14 | Payer: BLUE CROSS/BLUE SHIELD

## 2024-08-13 DIAGNOSIS — C719 Malignant neoplasm of brain, unspecified: Principal | ICD-10-CM

## 2024-08-13 LAB — COMPREHENSIVE METABOLIC PANEL
ALBUMIN: 3.6 g/dL (ref 3.4–5.0)
ALKALINE PHOSPHATASE: 88 U/L (ref 46–116)
ALT (SGPT): 42 U/L (ref 10–49)
ANION GAP: 14 mmol/L (ref 5–14)
AST (SGOT): 28 U/L (ref <=34)
BILIRUBIN TOTAL: 0.3 mg/dL (ref 0.3–1.2)
BLOOD UREA NITROGEN: 27 mg/dL — ABNORMAL HIGH (ref 9–23)
BUN / CREAT RATIO: 16
CALCIUM: 10.5 mg/dL — ABNORMAL HIGH (ref 8.7–10.4)
CHLORIDE: 109 mmol/L — ABNORMAL HIGH (ref 98–107)
CO2: 24 mmol/L (ref 20.0–31.0)
CREATININE: 1.71 mg/dL — ABNORMAL HIGH (ref 0.73–1.18)
EGFR CKD-EPI (2021) MALE: 42 mL/min/1.73m2 — ABNORMAL LOW (ref >=60)
GLUCOSE RANDOM: 255 mg/dL — ABNORMAL HIGH (ref 70–179)
POTASSIUM: 5.1 mmol/L — ABNORMAL HIGH (ref 3.4–4.8)
PROTEIN TOTAL: 6.9 g/dL (ref 5.7–8.2)
SODIUM: 147 mmol/L — ABNORMAL HIGH (ref 135–145)

## 2024-08-13 LAB — CBC W/ AUTO DIFF
BASOPHILS ABSOLUTE COUNT: 0 10*9/L (ref 0.0–0.1)
BASOPHILS RELATIVE PERCENT: 0.3 %
EOSINOPHILS ABSOLUTE COUNT: 0.1 10*9/L (ref 0.0–0.5)
EOSINOPHILS RELATIVE PERCENT: 0.8 %
HEMATOCRIT: 38.5 % — ABNORMAL LOW (ref 39.0–48.0)
HEMOGLOBIN: 12.6 g/dL — ABNORMAL LOW (ref 12.9–16.5)
LYMPHOCYTES ABSOLUTE COUNT: 1 10*9/L — ABNORMAL LOW (ref 1.1–3.6)
LYMPHOCYTES RELATIVE PERCENT: 9.9 %
MEAN CORPUSCULAR HEMOGLOBIN CONC: 32.7 g/dL (ref 32.0–36.0)
MEAN CORPUSCULAR HEMOGLOBIN: 30.7 pg (ref 25.9–32.4)
MEAN CORPUSCULAR VOLUME: 93.9 fL (ref 77.6–95.7)
MEAN PLATELET VOLUME: 8 fL (ref 6.8–10.7)
MONOCYTES ABSOLUTE COUNT: 0.9 10*9/L — ABNORMAL HIGH (ref 0.3–0.8)
MONOCYTES RELATIVE PERCENT: 8.5 %
NEUTROPHILS ABSOLUTE COUNT: 8.3 10*9/L — ABNORMAL HIGH (ref 1.8–7.8)
NEUTROPHILS RELATIVE PERCENT: 80.5 %
NUCLEATED RED BLOOD CELLS: 0 /100{WBCs} (ref <=4)
PLATELET COUNT: 246 10*9/L (ref 150–450)
RED BLOOD CELL COUNT: 4.1 10*12/L — ABNORMAL LOW (ref 4.26–5.60)
RED CELL DISTRIBUTION WIDTH: 17.2 % — ABNORMAL HIGH (ref 12.2–15.2)
WBC ADJUSTED: 10.3 10*9/L (ref 3.6–11.2)

## 2024-08-13 NOTE — Progress Notes (Signed)
 SiteLast TxDose  Mk:Amjpw: 08/13/2024: 1,602/4,005 cGy    Headaches: None  Pain: None  Vision: Pt wears glasses  Ataxia/Balance: Unsteady - PT started yesterday, mostly likely x2 weekly  Hearing: No issues  Numbness/Tingling: No numbness/tingling; right hand shakes; swelling in lower right leg - pt wearing compression socks  Weakness: None reported  Seizures: None  Memory(Confusion, etc.)/Speech: Speech in not way, can't think as fast; no memory issues  Insomnia: Sleeping throughout the night  Nausea/Vomitting: None  Appetite/Diet: Adequate  Skin: Intact  Elimination: Urination frequency - pt drinks a lot of water ; no bowel issues

## 2024-08-13 NOTE — Progress Notes (Signed)
 Visit Date: 08/13/2024    Radiation Oncology On-treatment Visit  Frank Leach is a 74 y.o. male with a diagnosis of glioblastoma who is being treated with radiation and presents for an on-treatment visit.      Radiation Dose/Technique Summary:     Radiation Treatments       Active   Brain (Started on 08/05/2024)   Most recent fraction: 267 cGy given on 08/13/2024   Total given: 1,602 cGy / 4,005 cGy  (6 of 15 fractions)   Elapsed Days: 8   Technique: VMAT/IMRT           Historical   No historical radiation treatments to show.               Assessment and Plan:    Plan for Therapy: Continue treatment as planned.  Continue concurrent temazolamide  Post-RT follow-up plan: To be scheduled at final status check    Frank Leach is experiencing the following signs/symptoms, which do not require management at this time:  Pain: no issues  Nausea/Vomiting:  no issues  Steroid requirement:  Mild symptomatic, not requiring steroids  Fatigue: Mild fatigue- no intervention needed  Skin/Dermatitis:  no issues  Bowel Movements:  no issues  Nutrition: no issues  Labs:  Will get weekly labs while on TMZ (will check after visit today)  Neuro:  Stable right sided weakness    Case Data   Subjective: Feeling well today- better than last week.  Working with PT and feeling stronger- took a few steps without a walker yesterday which was new for him.  No headaches, no head pressure, no nausea.  Continues to take TMZ without issues    Clinical Trial:   no    Pertinent data and Focused Physical Exam   There were no vitals taken for this visit.  Last weight:    Wt Readings from Last 4 Encounters:   07/22/24 70.2 kg (154 lb 12.2 oz)   07/13/24 73 kg (160 lb 15 oz)   07/03/24 72.6 kg (160 lb)   01/18/24 73.2 kg (161 lb 6.4 oz)     General:  Alert and Oriented X 3.  No acute distress.    Neuro:  Alert and oriented x4. CN II-XII grossly intact. Sensation intact to light touch in upper and lower extremities bilaterally.  Strength stable/slightly improved from last week.  4/5 in Right upper/lower extremity.  Strength 5/5 in left sided upper/lower extremity  Skin: No issues    Pain  Pain Evaluation:                                    Pain Score (0 - 1):                                Pain Score (Wong-Baker Faces):        Pain Location:                                        Pain Frequency:                                   Karnofsky/Lansky Performance Status: 60, Requires occasional assistance, but is able to  care for most of his personal needs (ECOG equivalent 2)  _______________________________________________________________    Stage:  Cancer Staging   No matching staging information was found for the patient.      Electronically Signed by:  Jeovanny Cuadros Alexander, MD  08/13/2024   9:38 AM

## 2024-08-15 DIAGNOSIS — C719 Malignant neoplasm of brain, unspecified: Principal | ICD-10-CM

## 2024-08-20 ENCOUNTER — Ambulatory Visit: Admit: 2024-08-20 | Discharge: 2024-08-21 | Payer: BLUE CROSS/BLUE SHIELD

## 2024-08-20 LAB — CBC W/ AUTO DIFF
BASOPHILS ABSOLUTE COUNT: 0.1 10*9/L (ref 0.0–0.1)
BASOPHILS RELATIVE PERCENT: 0.8 %
EOSINOPHILS ABSOLUTE COUNT: 0.1 10*9/L (ref 0.0–0.5)
EOSINOPHILS RELATIVE PERCENT: 0.8 %
HEMATOCRIT: 38.8 % — ABNORMAL LOW (ref 39.0–48.0)
HEMOGLOBIN: 12.8 g/dL — ABNORMAL LOW (ref 12.9–16.5)
LYMPHOCYTES ABSOLUTE COUNT: 0.7 10*9/L — ABNORMAL LOW (ref 1.1–3.6)
LYMPHOCYTES RELATIVE PERCENT: 5.9 %
MEAN CORPUSCULAR HEMOGLOBIN CONC: 33 g/dL (ref 32.0–36.0)
MEAN CORPUSCULAR HEMOGLOBIN: 30.6 pg (ref 25.9–32.4)
MEAN CORPUSCULAR VOLUME: 92.8 fL (ref 77.6–95.7)
MEAN PLATELET VOLUME: 8.9 fL (ref 6.8–10.7)
MONOCYTES ABSOLUTE COUNT: 1.2 10*9/L — ABNORMAL HIGH (ref 0.3–0.8)
MONOCYTES RELATIVE PERCENT: 10.5 %
NEUTROPHILS ABSOLUTE COUNT: 9.2 10*9/L — ABNORMAL HIGH (ref 1.8–7.8)
NEUTROPHILS RELATIVE PERCENT: 82 %
NUCLEATED RED BLOOD CELLS: 0 /100{WBCs} (ref <=4)
PLATELET COUNT: 184 10*9/L (ref 150–450)
RED BLOOD CELL COUNT: 4.19 10*12/L — ABNORMAL LOW (ref 4.26–5.60)
RED CELL DISTRIBUTION WIDTH: 16.6 % — ABNORMAL HIGH (ref 12.2–15.2)
WBC ADJUSTED: 11.2 10*9/L (ref 3.6–11.2)

## 2024-08-20 LAB — COMPREHENSIVE METABOLIC PANEL
ALBUMIN: 3.7 g/dL (ref 3.4–5.0)
ALKALINE PHOSPHATASE: 83 U/L (ref 46–116)
ALT (SGPT): 30 U/L (ref 10–49)
ANION GAP: 15 mmol/L — ABNORMAL HIGH (ref 5–14)
AST (SGOT): 19 U/L (ref <=34)
BILIRUBIN TOTAL: 0.3 mg/dL (ref 0.3–1.2)
BLOOD UREA NITROGEN: 25 mg/dL — ABNORMAL HIGH (ref 9–23)
BUN / CREAT RATIO: 13
CALCIUM: 10.7 mg/dL — ABNORMAL HIGH (ref 8.7–10.4)
CHLORIDE: 106 mmol/L (ref 98–107)
CO2: 25.9 mmol/L (ref 20.0–31.0)
CREATININE: 1.88 mg/dL — ABNORMAL HIGH (ref 0.73–1.18)
EGFR CKD-EPI (2021) MALE: 38 mL/min/1.73m2 — ABNORMAL LOW (ref >=60)
GLUCOSE RANDOM: 289 mg/dL — ABNORMAL HIGH (ref 70–179)
POTASSIUM: 4.9 mmol/L — ABNORMAL HIGH (ref 3.4–4.8)
PROTEIN TOTAL: 7.1 g/dL (ref 5.7–8.2)
SODIUM: 147 mmol/L — ABNORMAL HIGH (ref 135–145)

## 2024-08-20 LAB — SLIDE REVIEW

## 2024-08-20 NOTE — Progress Notes (Signed)
 Visit Date: 08/20/2024    Radiation Oncology On-treatment Visit  [name] is a 74 y.o. male with a diagnosis of glioblastoma who is being treated with radiation and presents for an on-treatment visit.      Radiation Dose/Technique Summary:     Radiation Treatments       Active   Brain (Started on 08/05/2024)   Most recent fraction: 267 cGy given on 08/20/2024   Total given: 2,937 cGy / 4,005 cGy  (11 of 15 fractions)   Elapsed Days: 15   Technique: VMAT/IMRT           Historical   No historical radiation treatments to show.               Assessment and Plan:    Plan for Therapy: Continue treatment as planned.  Continue concurrent temazolamide.  Of note he thinks he will run out of pills before the last day of RT (1 pill short)- I'll reach out to Dr. Denis to see if she wants to send one more pill.  Post-RT follow-up plan:  Will reach out to Dr. Denis RE follow-up plans    [name] is experiencing the following signs/symptoms, which do not require management at this time:  Pain: no issues  Nausea/Vomiting:  no issues  Steroid requirement:  Mild symptomatic, not requiring steroids  Fatigue: Mild fatigue- no intervention needed  Skin/Dermatitis:  no issues  Bowel Movements:  no issues  Nutrition: no issues  Labs:  Will get weekly labs while on TMZ (will check after visit today)  Neuro:  Stable/improved right sided weakness    Case Data   Subjective: No major changes this week.  Right side is feeling stronger- walking with assistance, standing for longer periods of time, etc.  Still working with PT.  No headaches, no nausea, no new focal neuro deficits.    Clinical Trial:   no    Pertinent data and Focused Physical Exam   There were no vitals taken for this visit.  Last weight:    Wt Readings from Last 4 Encounters:   07/22/24 70.2 kg (154 lb 12.2 oz)   07/13/24 73 kg (160 lb 15 oz)   07/03/24 72.6 kg (160 lb)   01/18/24 73.2 kg (161 lb 6.4 oz)     General:  Alert and Oriented X 3.  No acute distress.    Neuro: Alert and oriented x4. CN II-XII grossly intact. Sensation intact to light touch in upper and lower extremities bilaterally.  Strength stable/slightly improved from last week.  4/5 in Right upper/lower extremity.  Strength 5/5 in left sided upper/lower extremity  Skin: No issues    Pain  Pain Evaluation:                                    Pain Score (0 - 1):                                Pain Score (Wong-Baker Faces):        Pain Location:                                        Pain Frequency:  Karnofsky/Lansky Performance Status: 60, Requires occasional assistance, but is able to care for most of his personal needs (ECOG equivalent 2)  _______________________________________________________________    Stage:  Cancer Staging   No matching staging information was found for the patient.      Electronically Signed by:  Sameer Teeple Alexander, MD  08/20/2024   9:38 AM

## 2024-08-20 NOTE — Progress Notes (Signed)
 SiteLast TxDose  Mk:Amjpw: 08/20/2024: 2,937/4,005 cGy    Headaches: None  Pain: None  Vision: Pt wears glasses  Ataxia/Balance: Unsteady - PT started yesterday, mostly likely x2 weekly  Hearing: No issues  Numbness/Tingling: No numbness/tingling; right hand shakes; swelling in lower right leg - pt wearing compression socks  Weakness: Right leg is still weak  Seizures: None  Memory(Confusion, etc.)/Speech: Speech good, can't think as fast; no memory issues  Insomnia: Sleeping throughout the night  Nausea/Vomitting: None  Appetite/Diet: Adequate  Skin: Intact  Elimination: Urination frequency - pt drinks a lot of water ; no bowel issues

## 2024-08-21 NOTE — Telephone Encounter (Signed)
 Patient Frank Leach was called in attempt number 1 to reach them regarding scheduling their upcoming appointments on 09/22/24.     The call resulted in: Message Left      Thank you,    [name]

## 2024-08-21 NOTE — Telephone Encounter (Signed)
 I spoke with patient Frank Leach to confirm appointments on the following date(s): MRI 09/22/24 at 5:15pm [facility]. Pt requested I call back to schedule lab and clinic visit.    Gabryel C Wilborn

## 2024-08-21 NOTE — Telephone Encounter (Signed)
 I spoke with patient Frank Leach to confirm appointments on the following date(s): 2/16 MRI at 5pm and 2/17 labs and Rauf visit    [name]

## 2024-08-22 ENCOUNTER — Encounter: Admit: 2024-08-22 | Discharge: 2024-08-23 | Payer: Medicare (Managed Care) | Attending: Nephrology | Primary: Nephrology

## 2024-08-22 DIAGNOSIS — C719 Malignant neoplasm of brain, unspecified: Principal | ICD-10-CM

## 2024-08-22 MED ORDER — INSULIN LISPRO (U-100) 100 UNIT/ML SUBCUTANEOUS PEN
Freq: Three times a day (TID) | SUBCUTANEOUS | 11 refills | 30.00000 days | Status: CP
Start: 2024-08-22 — End: 2025-08-22

## 2024-08-22 MED ORDER — PEN NEEDLE, DIABETIC 32 GAUGE X 5/32" (4 MM)
ORAL | 3 refills | 0.00000 days | Status: CP
Start: 2024-08-22 — End: ?

## 2024-08-22 MED ORDER — TEMOZOLOMIDE 140 MG CAPSULE
ORAL_CAPSULE | Freq: Every day | ORAL | 0 refills | 2.00000 days | Status: CP
Start: 2024-08-22 — End: 2025-08-22

## 2024-08-22 NOTE — Telephone Encounter (Signed)
 Spoke with patient on the phone.    The rad onc team reached out and asked about him not having two days of TMZ. Let him know that we will send two day supply to the [facility] outpatient pharmacy. He will pick them up on Tuesday morning.    Confirmed with the pharmacy that they do have 140 mg #35 capsules in stock and re-routed the prescription.    Annye Merles PharmD, BCOP, CPP  Neuro-Oncology Clinic Pharmacist

## 2024-08-22 NOTE — Progress Notes (Signed)
 Nephrology   Virtual Encounter     Transplant Nephrology Virtual Visit    History of Present Illness    74 y.o. male here for follow up after kidney transplantation.      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 Interval decrease in linear soft tissue within the right nephrectomy bed, potentially postsurgical or fat necrosis. Unchanged linear tissue within the left nephrectomy bed. No suspicious enhancing lesions are visualized within the surgical beds     Post-Txp 06/25/2019 (txp kidney only)  The renal transplant was located in the left lower quadrant. Normal size and echogenicity.  No solid masses or calculi. Trace perinephric fluid adjacent to the lower pole of the kidney. Mild pelviectasis  - Perfusion: Using power Doppler, normal perfusion was seen throughout the renal parenchyma.  - Resistive indices in the renal transplant are stable compared with prior examination.  - Main renal artery/iliac artery: Patent. Again noted are 3 renal arteries. Resistive indices within the renal arteries are stable to minimally increased, now at or just above normal limits.  - Main renal vein/iliac vein: Patent       Subjective/Interval:     Since last visit with me developed gait issues, was found to have frontal brain mass. S/p resection 07/11/24 with pathology showing glioblastoma. Appreciate heme/onc and radiation oncology assistance with case.    He tells me he is overall doing well today.  AM glucoses low 120s-200s  Evening time 300-400, there were a few 500 level        He overall feels well  No chest pain, no shortness of breath at rest, no lower extremity edema, no nausea, no vomiting, no diarrhea, no pain swallowing, no tremors, no fevers, no chills, no skin changes. No vision changes  No pain urinating, no trouble passing urine, no visible blood in urine.  The patient reports they are able to obtain and take their immunosuppressants without issues.          Review of Systems      Otherwise as per HPI, all other systems reviewed and are negative.    Past Medical History, Surgical History, Family History, and Social History reviewed in electronic record        Medications  Current Outpatient Medications   Medication Sig Dispense Refill    acetaminophen  (TYLENOL ) 500 MG tablet Take 1-2 tablets (500-1,000 mg total) by mouth every six (6) hours as needed for pain or fever (> 38C). 100 tablet 0    [Paused] allopurinol  (ZYLOPRIM ) 100 MG tablet Take 1 tablet (100 mg total) by mouth every evening. 90 tablet 3    amlodipine  (NORVASC ) 10 MG tablet Take 1 tablet (10 mg total) by mouth daily. 90 tablet 3    atorvastatin  (LIPITOR ) 40 MG tablet Take 1 tablet (40 mg total) by mouth daily. 90 tablet 3    blood sugar diagnostic (GLUCOSE BLOOD) Strp Use to check blood sugar  3 times a day and for symptoms of high or low blood sugar. 100 strip 0    blood-glucose meter kit Use as instructed 1 each 0    ENVARSUS  XR 4 mg Tb24 extended release tablet TAKE TWO TABLETS BY MOUTH EVERY MORNING 60 tablet 0    lancets Misc Use to check blood sugar 3 times a day and for symptoms of high or low blood sugar. 100 each 0    latanoprost (XALATAN) 0.005 % ophthalmic solution Administer 1 drop to both eyes nightly.      magnesium  oxide-Mg AA chelate (MAGNESIUM , AMINO ACID CHELATE,) 133 mg Take 1 tablet by mouth two (2) times a day. 100 tablet 11    olmesartan  (BENICAR ) 40 MG tablet Take 1 tablet (40 mg total) by mouth daily. 90 tablet 3    ondansetron  (ZOFRAN ) 8 MG tablet Take 1 tablet (8 mg total) by mouth every eight (8) hours as needed for nausea. 21 tablet 1    predniSONE  (DELTASONE ) 5 MG tablet Take 1 tablet (5 mg total) by mouth daily. 90 tablet 3    temozolomide  (TEMODAR ) 140 mg capsule Take 1 capsule (140 mg total) by mouth daily . Take 1 hour before radiation on an empty stomach (2 hours after eating). Take 1 hour after ondansetron . 2 capsule 0     No current facility-administered medications for this visit.           Physical Exam  Video visit  General: no acute distress  Neck: neck supple  Lungs: normal respiratory effort  Neurologic: awake, alert, and oriented x3      Laboratory Data and Imaging reviewed in EPIC      Assessment:    ICD-10-CM   1. Kidney transplant recipient (HHS-HCC)  Z94.0   2. Immunosuppressive management encounter following kidney transplant (HHS-HCC)  Z79.899    Z94.0   3. 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 No DSA in 06/2023, will repeat next in person visit. UPCR 0.258g in 06/2023--will repeat next in person visit. Continue jardiance  25 mg daily.    Immunosuppression [High Risk Medical Decision Making For Drug Therapy Requiring Intensive Monitoring For Toxicity]:   Tacrolimus , Trough (ng/mL)   Date Value   08/06/2024 4.2 (L)   07/30/2024 3.6 (L)   07/28/2024 3.8 (L)   Increase envarsus  to 6 mg daily  tacrolimus  trough level goal of approximately 5-7 ng/mL. Continue prednisone  5 mg daily  Holding mycophenolate  due to malignancy    Other interventions:  -Low level CMV viremia: resolved, negative in Nov and Dec 2025 will hold on checking further levels unless symptoms develop  -Essential hypertension: follow BP at next in person visit, no new interventions  -Diabetes Mellitus type 2: very high evening glucose levels, start sliding scale insulin . TNC sent to [place] [facility]. Hold off on metformin  as he may be needing some contrast studies in near future. Txp DM follow up in 2 weeks.           Counseling:  I counseled the patient on:  The need to avoid sun exposure and the use of sunblock while outdoors given the relatively higher risk of skin malignancy in an immunosuppressed state.  The need for adherence to  immunosuppression medication.  Patient verbalized understanding.     Follow-Up:  Return in about 4 weeks (around 09/19/2024).     Patient will continue to follow-up with his primary care provider for non-transplant related issues and medication refills. We have ordered transplant specific labs per the center's guidelines to monitor and assess for toxicities from immunosuppressant drug therapy          Oneil Loader, DO  Transplant Nephrology  [facility]  08/22/2024  1:13 PM                       I have identified myself to the patient and conveyed my credentials.  In case we get disconnected, patient's phone number is [phone] (home)    Is there someone else in the room? Yes. What is your relationship? spouse. Do you want this person here for the visit? yes.  Patient location: home, [place], [place]      The patient reports they are physically located in [place]  and is currently: at home. I conducted a audio/video visit. I spent  66m 30s on the video call with the patient. I spent an additional 20 minutes on pre- and post-visit activities on the date of service .

## 2024-08-22 NOTE — Progress Notes (Signed)
 Sent TMZ x 2 day supply as patient will be out of it the last two days of RT.

## 2024-08-26 NOTE — Progress Notes (Signed)
 REFERRING PROVIDER: Lamar Elsie Quale    ASSESSMENT:  Frank Leach is a 74 y.o. male with a new bladder mass.    Today we discussed the nature of bladder masses, and our next recommended step including transurethral resection of bladder tumor also known as TURBT.  This procedure can be both diagnostic and therapeutic, and will determine whether the bladder mass represents a benign or malignant lesion.  I also discussed the benefit of intravesical perioperative chemotherapy, intravesical gemcitabine--to be determined based on findings in OR.  AUA guidelines recommend instillation when possible given the low risk of side effects and benefit in reducing future recurrences should low-grade bladder cancer be discovered.    Based on his presentation, we suspect this represents presentation with new diagosis of bladder cancer.  Acknowledged the uncertainty prior to definitive diagnosis, but for informational purposes discussed the epidemiology, risk stratification, and treated natural history of bladder cancer.  Discussed the rationale for TURBT as next step, as a diagnostic and in some cases therapeutic procedure.  Discussed bladder cancer stage and grade, referring to the printed resources from the [employer] and guiding them to additional resources at https://[email or web address]-hernandez.com/.  Given the epidemiology of bladder cancer, ~25% are muscle invasive at diagnosis (stage T2 or greater) in which case the procedure would be diagnostic but additional conversations would follow regarding potential radical surgery with or without perioperative chemotherapy or chemoradiation as the standard approaches.  Approximately 25% are high grade non-muscle-invasive (NMIBC), Stage <T2.  In these cases, we will often recommend restaging TURBT and consideration of intravesical therapy, most commonly BCG.  Approximately half of all new diagnoses are low grade noninvasive (stage 0 / Ta), in which case the TURBT may be diagnostic and therapeutic, with in some cases a single dose of intravesical gemcitabine accompanying the resection.   Discussed the need for long-term surveillance given the high rates of recurrence and lower but nonzero rates of progression in cases of NMIBC.  The patient and family had the opportunity to ask a number of informed questions answered to their apparent satisfaction.     At this point recommend TURBT as next step. Discussed the risks and benefits of the procedure--we perform this typically as an outpatient under general anesthesia. In some cases based on the location of the resection a patient may require placement of a temporary ureteral stent and/or temporary bladder cancer.  Rarely, if tumor involves ureteral orifice and stent not able to be placed, may require placement of a nephrostomy tube and/or antegrade ureteral stent.  Risks include bleeding, infection, risks of anesthesia, injury to adjacent structures and the potential need for additional treatments or procedures.  The patient understands and is willing to proceed. Preop was performed today.    All of this is occurring in context of his concomitant GBM, from which he is doing well in general; will coordinate scheduling around his care for that. The patient has had an opportunity to ask questions, and these questions were answered to satisfaction.  The patient expressed understanding.    Urology Surgery Scheduling Checklist    Timing: Next available  Attending: Dr. Sander   Urology Pre-op:  No - done in clinic today  Pre Anesthesia (Precare):  Yes - Scheduler place order when surgery confirmed  Urine:  Yes - 1-4 weeks prior  Testing Plan:  No additional testing  Blood thinners:  None  Cardiac/Anticoagulation Clearance: Not indicated     PLAN:  TURBT next available  Preop urine culture  to be obtained  Cytology    REASON FOR VISIT: Bladder mass    HISTORY OF PRESENT ILLNESS:   Frank Leach is a 74 y.o. male who comes in today in consultation at the request of Lamar Elsie Quale for evaluation of a new bladder mass.     Patient was recently seen as a consultation to the urology service while admitted to [facility] in November 2025. He presented to [facility] ED for focal neurologic deficit of RLE weakness, found to have L frontal lobe mass on MRI.  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 Since then, patient reports feeling well overall. Recovering from surgery and hospitalization. No hematuria, dysuria, urgency, some frequency.     A 2.0 x 1.4 cm bladder mass was incidentally found on CT AP w IV contrast at time of that admission.      History of kidney transplant 2020, followed by nephrology here.  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 S/p renal transplantation 2020    No smoking history, no history of bladder cancer in family.    PAST MEDICAL HISTORY:   Past Medical History[1]    PAST SURGICAL HISTORY:   Past Surgical History[2]    MEDICATIONS:   Current Rx[3]    ALLERGIES:   Allergies[4]    FAMILY HISTORY:   Family History[5]    SOCIAL HISTORY:    reports that he has never smoked. He has never used smokeless tobacco. He reports that he does not drink alcohol and does not use drugs.    PHYSICAL EXAM:   GENERAL: Pleasant male in no acute distress.   VITAL SIGNS: There were no vitals taken for this visit.  HEENT: Normocephalic, atraumatic, extraocular muscles intact  PULMONARY: Normal work of breathing, no use of accessory muscles  PSYCHOLOGIC: Normal affect, normal mood    REVIEW OF SYSTEMS: Negative upon 10 system review other than what is mentioned in the HPI.    LAB RESULTS:   Results for orders placed or performed in visit on 08/20/24   Comprehensive Metabolic Panel   Result Value Ref Range    Sodium 147 (H) 135 - 145 mmol/L    Potassium 4.9 (H) 3.4 - 4.8 mmol/L    Chloride 106 98 - 107 mmol/L    CO2 25.9 20.0 - 31.0 mmol/L Anion Gap 15 (H) 5 - 14 mmol/L    BUN 25 (H) 9 - 23 mg/dL    Creatinine 8.11 (H) 0.73 - 1.18 mg/dL    BUN/Creatinine Ratio 13     eGFR CKD-EPI (2021) Male 38 (L) >=60 mL/min/1.59m2    Glucose 289 (H) 70 - 179 mg/dL    Calcium 89.2 (H) 8.7 - 10.4 mg/dL    Albumin 3.7 3.4 - 5.0 g/dL    Total Protein 7.1 5.7 - 8.2 g/dL    Total Bilirubin 0.3 0.3 - 1.2 mg/dL    AST 19 <=65 U/L    ALT 30 10 - 49 U/L    Alkaline Phosphatase 83 46 - 116 U/L   CBC w/ Differential   Result Value Ref Range    WBC 11.2 3.6 - 11.2 10*9/L    RBC 4.19 (L) 4.26 - 5.60 10*12/L    HGB 12.8 (L) 12.9 - 16.5 g/dL    HCT 61.1 (L) 60.9 - 48.0 %    MCV 92.8 77.6 - 95.7 fL    MCH 30.6 25.9 -  32.4 pg    MCHC 33.0 32.0 - 36.0 g/dL    RDW 83.3 (H) 87.7 - 15.2 %    MPV 8.9 6.8 - 10.7 fL    Platelet 184 150 - 450 10*9/L    nRBC 0 <=4 /100 WBCs    Neutrophils % 82.0 %    Lymphocytes % 5.9 %    Monocytes % 10.5 %    Eosinophils % 0.8 %    Basophils % 0.8 %    Absolute Neutrophils 9.2 (H) 1.8 - 7.8 10*9/L    Absolute Lymphocytes 0.7 (L) 1.1 - 3.6 10*9/L    Absolute Monocytes 1.2 (H) 0.3 - 0.8 10*9/L    Absolute Eosinophils 0.1 0.0 - 0.5 10*9/L    Absolute Basophils 0.1 0.0 - 0.1 10*9/L   Morphology Review   Result Value Ref Range    Smear Review Comments See Comment Undefined    Burr Cells Present (A) Not Present     *Note: Due to a large number of results and/or encounters for the requested time period, some results have not been displayed. A complete set of results can be found in Results Review.    I saw and evaluated the patient, participating in the key portions of the service.  I reviewed the resident???s note.  I agree with the resident???s findings and plan. Donnice FORBES Scrivener, MD           [1]   Past Medical History:  Diagnosis Date    Anemia     Cancer    (CMS-HCC)     clear cell adenocarcinoma of left kidney    Diabetes mellitus (CMS-HCC)     Diabetic nephropathy (CMS-HCC)     ESRD (end stage renal disease) on dialysis    (CMS-HCC)     ESRD on dialysis (CMS-HCC)     Gout     Hyperlipidemia     Hypertension    [2]   Past Surgical History:  Procedure Laterality Date    AV FISTULA PLACEMENT      PR COLONOSCOPY FLX DX W/COLLJ SPEC WHEN PFRMD N/A 04/30/2018    Procedure: COLONOSCOPY, FLEXIBLE, PROXIMAL TO SPLENIC FLEXURE; DIAGNOSTIC, W/WO COLLECTION SPECIMEN BY BRUSH OR WASH;  Surgeon: Dorn Lynwood Lauth, MD;  [facility]ED[facility];  Service: Gastroenterology    PR COLONOSCOPY W/BIOPSY SINGLE/MULTIPLE N/A 04/30/2018    Procedure: COLONOSCOPY, FLEXIBLE, PROXIMAL TO SPLENIC FLEXURE; WITH BIOPSY, SINGLE OR MULTIPLE;  Surgeon: Dorn Lynwood Lauth, MD;  Location: [facility];  Service: Gastroenterology    PR COLSC FLX W/RMVL OF TUMOR POLYP LESION SNARE TQ N/A 04/30/2018    Procedure: COLONOSCOPY FLEX; W/REMOV TUMOR/LES BY SNARE;  Surgeon: Dorn Lynwood Lauth, MD;  Location: [facility];  Service: Gastroenterology    PR EXCIS SUPRATENT BRAIN TUMOR Left 07/11/2024    Procedure: Left craniotomy for resection of mass;  Surgeon: Zettie Rosaria HERO, MD;  Location: OR [facility];  Service: Neurosurgery    PR EXPLORATORY OF ABDOMEN Midline 04/14/2016    Procedure: EXPLORATORY LAPAROTOMY, EXPLORATORY CELIOTOMY WITH OR WITHOUT BIOPSY(S);  Surgeon: Marsa Sam Boss, MD;  Location: MAIN OR [facility];  Service: Transplant    PR EXPLORATORY OF ABDOMEN N/A 04/16/2016    Procedure: EXPLORATORY LAPAROTOMY, EXPLORATORY CELIOTOMY WITH OR WITHOUT BIOPSY(S);  Surgeon: Marsa Sam Boss, MD;  Location: MAIN OR [facility];  Service: Transplant    PR IONM 1 ON 1 IN OR W/ATTENDANCE EACH 15 MINUTES N/A 07/11/2024    Procedure:  CONTINUOUS INTRAOPERATIVE NEUROPHYSIOLOGY MONITORING IN OR;  Surgeon: Zettie Rosaria HERO, MD;  Location: OR [facility];  Service: Neurosurgery    PR LAP, RADICAL NEPHRECTOMY Right 12/27/2016    Procedure: Robotic Xi Laparoscopy; Radical Nephrectomy (Incl Remove Gerota`S Fascia, Fatty Tissue, Reg Lymph Node, Adrenalectomy);  Surgeon: Clorinda Ole Chalk, MD;  Location: MAIN OR [facility];  Service: Urology    PR LIGATN ANGIOACCESS AV FISTULA Left 11/29/2022    Procedure: LIGATION OR BANDING OF ANGIOACCESS ARTERIOVENOUS FISTULA;  Surgeon: Toledo, Marsa Messier, MD;  Location: MAIN OR [facility];  Service: Transplant    PR MICROSURG TECHNIQUES,REQ OPER MICROSCOPE N/A 07/11/2024    Procedure: MICROSURGICAL TECHNIQUES, REQUIRING USE OF OPERATING MICROSCOPE (LIST SEPARATELY IN ADDITION TO CODE FOR PRIMARY PROCEDURE);  Surgeon: Zettie Rosaria HERO, MD;  Location: OR [facility];  Service: Neurosurgery    PR NEGATIVE PRESSURE WOUND THERAPY DME >50 SQ CM N/A 04/12/2016    Procedure: NEG PRESS WOUND TX (VAC ASSIST) INCL TOPICALS, PER SESSION, TSA GREATER THAN/= 50 CM SQUARED;  Surgeon: Marsa Messier Boss, MD;  Location: MAIN OR [place];  Service: Transplant    PR NEGATIVE PRESSURE WOUND THERAPY DME >50 SQ CM Bilateral 04/14/2016    Procedure: NEG PRESS WOUND TX (VAC ASSIST) INCL TOPICALS, PER SESSION, TSA GREATER THAN/= 50 CM SQUARED;  Surgeon: Marsa Messier Boss, MD;  Location: MAIN OR [place];  Service: Transplant    PR NEGATIVE PRESSURE WOUND THERAPY DME >50 SQ CM N/A 04/16/2016    Procedure: NEG PRESS WOUND TX (VAC ASSIST) INCL TOPICALS, PER SESSION, TSA GREATER THAN/= 50 CM SQUARED;  Surgeon: Marsa Messier Boss, MD;  Location: [facility] OR [facility];  Service: Transplant    PR NEPHRECTOMY, W/PART. URETECTOMY Bilateral 04/11/2016    Procedure: LAPAROSCOPY, SURGICAL, NEPHRECTOMY WITH TOTAL URETERECTOMY;  Surgeon: Marsa Messier Boss, MD;  Location: MAIN OR [facility];  Service: Transplant    PR REMV KIDNEY,W/RIB RESECTION Bilateral 04/11/2016    Procedure: NEPHRECTOMY, INCLUDING PARTIAL URETERECTOMY, ANY OPEN APPROACH INCLUDING RIB RESECTION;  Surgeon: Marsa Messier Boss, MD;  Location: MAIN OR [facility];  Service: Transplant    PR STEREOTACTIC COMP ASSIST PROC,CRANIAL,INTRADURAL N/A 07/11/2024    Procedure: STEREOTACTIC COMPUTER-ASSISTED (NAVIGATIONAL) PROCEDURE; CRANIAL, INTRADURAL;  Surgeon: Zettie Rosaria HERO, MD;  Location: OR [facility];  Service: Neurosurgery    PR TRANSPLANT,PREP LIVING  RENAL GRAFT N/A 06/22/2019    Procedure: [facility] STD PREP LIVING DONOR RENAL ALLGRFT (OPEN/LAPROSC) PRIOR TO TRANSPLANT, INC DISSECT/REM AS NECESS;  Surgeon: Marsa Messier Boss, MD;  Location: MAIN OR [facility];  Service: Transplant    PR TRANSPLANTATION OF KIDNEY N/A 06/22/2019    Procedure: RENAL ALLOTRANSPLANTATION, IMPLANTATION OF GRAFT; WITHOUT RECIPIENT NEPHRECTOMY;  Surgeon: Marsa Messier Boss, MD;  Location: MAIN OR [facility];  Service: Transplant    SPLENECTOMY     [3]   Current Outpatient Medications   Medication Sig Dispense Refill    acetaminophen  (TYLENOL ) 500 MG tablet Take 1-2 tablets (500-1,000 mg total) by mouth every six (6) hours as needed for pain or fever (> 38C). 100 tablet 0    [Paused] allopurinol  (ZYLOPRIM ) 100 MG tablet Take 1 tablet (100 mg total) by mouth every evening. 90 tablet 3    amlodipine  (NORVASC ) 10 MG tablet Take 1 tablet (10 mg total) by mouth daily. 90 tablet 3    atorvastatin  (LIPITOR ) 40 MG tablet Take 1 tablet (40 mg total) by mouth daily. 90 tablet 3    blood sugar diagnostic (GLUCOSE BLOOD) Strp Use to check blood sugar 3 times a day and for symptoms of  high or low blood sugar. 100 strip 0    blood-glucose meter kit Use as instructed 1 each 0    ENVARSUS  XR 4 mg Tb24 extended release tablet TAKE TWO TABLETS BY MOUTH EVERY MORNING 60 tablet 0    insulin  lispro (HUMALOG ) 100 unit/mL injection pen Inject 0-4 Units under the skin Three (3) times a day before meals. Blood Glucose 51-70 mg/dL Give juice/crackers 28-849 mg/dL 0 units 848-799 mg/dL 1 units 798-749 mg/dL 2 units 748-699 mg/dL 3 units 698-649 mg/dL 4 units 3.6 mL 11    lancets Misc Use to check blood sugar 3 times a day and for symptoms of high or low blood sugar. 100 each 0    latanoprost (XALATAN) 0.005 % ophthalmic solution Administer 1 drop to both eyes nightly.      magnesium  oxide-Mg AA chelate (MAGNESIUM , AMINO ACID CHELATE,) 133 mg Take 1 tablet by mouth two (2) times a day. 100 tablet 11    olmesartan  (BENICAR ) 40 MG tablet Take 1 tablet (40 mg total) by mouth daily. 90 tablet 3    ondansetron  (ZOFRAN ) 8 MG tablet Take 1 tablet (8 mg total) by mouth every eight (8) hours as needed for nausea. 21 tablet 1    pen needle, diabetic 32 gauge x 5/32 (4 mm) Ndle Use with insulin  up to 4 times/day as needed. 100 each 3    predniSONE  (DELTASONE ) 5 MG tablet Take 1 tablet (5 mg total) by mouth daily. 90 tablet 3    temozolomide  (TEMODAR ) 140 mg capsule Take 1 capsule (140 mg total) by mouth daily . Take 1 hour before radiation on an empty stomach (2 hours after eating). Take 1 hour after ondansetron . 2 capsule 0     No current facility-administered medications for this visit.   [4] No Known Allergies  [5]   Family History  Problem Relation Age of Onset    Kidney disease Mother     Cancer Father     Kidney disease Brother

## 2024-08-27 NOTE — Telephone Encounter (Signed)
 Spoke with patient and his spouse on the phone.    They had a question about continuing temozolomide  after radiation since he is finished with the radiation today. Unfortunately they were unable to pick up the two doses of TMZ at [facility] due to the pharmacy not having it in stock. Reassured them that missing two doses is okay and apologized for the misunderstanding.    Went over that patient will take 4 weeks off from everything, then decide with Dr. Denis what would be the best next step when he gets repeat scan and come back to see her in February. Most patients will continue temozolomide  on higher dose/different schedule.     They verbalized understanding and did not have any additional question.    Annye Merles PharmD, BCOP, CPP  Neuro-Oncology Clinic Pharmacist

## 2024-08-27 NOTE — Progress Notes (Signed)
 Final status check visit.    Headaches: None  Pain: None  Vision: Pt wears glasses  Ataxia/Balance: Unsteady - seeing PT 2 x weekly, feels like it's helping  Hearing: No issues  Numbness/Tingling: denies any issues  Weakness: Right leg is still weak, but improved from last visit  Seizures: None  Memory(Confusion, etc.)/Speech: feels like speech is okay, but notices that it takes longer to fully gets his thoughts out  Insomnia: Sleeping throughout the night  Nausea/Vomitting: None  Appetite/Diet: Adequate  Skin: Intact  Elimination: Urination frequency - pt drinks a lot of water ; no bowel issues

## 2024-08-27 NOTE — Progress Notes (Signed)
 Visit Date: 08/27/2024    Radiation Oncology On-treatment Visit  [name] is a 74 y.o. male with a diagnosis of glioblastoma who is being treated with radiation and presents for an on-treatment visit.      Radiation Dose/Technique Summary:     Radiation Treatments       Active   No active radiation treatments to show.     Historical   Brain (Started on 08/05/2024)   Most recent fraction: 267 cGy given on 08/27/2024   Total given: 4,005 cGy / 4,005 cGy  (15 of 15 fractions)   Elapsed Days: 22   Technique: VMAT/IMRT                     Assessment and Plan:    Plan for Therapy:  Done today.  FU here in a month.  Going for FU with Dr Denis in February 17th.  Going to [facility] on Friday this week for a bladder-related issue.  ,.Brain MRI scheduled Feb 16th.    He has run out of Temizolamide pills- [facility] message sent to Dr Denis to assess if he should be continuing on these pills.      [name] is experiencing the following signs/symptoms, which do not require management at this time:  Pain: no issues  Nausea/Vomiting:  no issues  Steroid requirement:  Is on prednisone  daily- on a stable 5 mg dose for the moment  Fatigue: Mild fatigue- no intervention needed  Skin/Dermatitis:  no issues  Bowel Movements:  no issues  Nutrition: no issues  Labs:  CBC is good last week.  Neuro:  Stable/improved right sided weakness    Case Data   Subjective: No major changes this week.  Right side is feeling stronger- walking with assistance, standing for longer periods of time, etc.  Still working with PT.  No headaches, no nausea, no new focal neuro deficits.  No new issues today; stable from last week.  If anything he is stronger every day (per the patient)    Clinical Trial:   no    Pertinent data and Focused Physical Exam   There were no vitals taken for this visit.  Last weight:    Wt Readings from Last 4 Encounters:   07/22/24 70.2 kg (154 lb 12.2 oz)   07/13/24 73 kg (160 lb 15 oz)   07/03/24 72.6 kg (160 lb)   01/18/24 73.2 kg (161 lb 6.4 oz)     General:  Alert and Oriented X 3.  No acute distress.    Neuro:  Alert and oriented  RUE with 4+ strength, 5/5 on the left UE  Dull sensation intact both sides  CNs grossly intact.    Skin: No issues    Pain  Pain Evaluation:                                    Pain Score (0 - 1):       no                         Pain Score (Wong-Baker Faces):        Pain Location:  Pain Frequency:                                   Karnofsky/Lansky Performance Status: 60, Requires occasional assistance, but is able to care for most of his personal needs (ECOG equivalent 2)  _______________________________________________________________    Stage:  Cancer Staging   No matching staging information was found for the patient.      Electronically Signed by:  Alexa Jerilynn Pin, MD  08/27/2024   9:38 AM

## 2024-08-29 ENCOUNTER — Ambulatory Visit: Admit: 2024-08-29 | Discharge: 2024-08-30 | Payer: Medicare (Managed Care) | Attending: Urology | Primary: Urology

## 2024-08-29 DIAGNOSIS — D494 Neoplasm of unspecified behavior of bladder: Principal | ICD-10-CM

## 2024-08-29 DIAGNOSIS — N3289 Other specified disorders of bladder: Principal | ICD-10-CM

## 2024-08-29 NOTE — Telephone Encounter (Signed)
 Urology Surgery Scheduling Checklist    Timing: Next available  Attending: Dr. Sander   Urology Pre-op:  No - done in clinic today  Pre Anesthesia (Precare):  Yes - Scheduler place order when surgery confirmed  Urine:  Yes - 1-4 weeks prior  Testing Plan:  No additional testing  Blood thinners:  None  Cardiac/Anticoagulation Clearance: Not indicated    Norleen Nyhan, MD  Urologic Surgery PGY1

## 2024-09-09 ENCOUNTER — Ambulatory Visit: Admit: 2024-09-09 | Discharge: 2024-09-09 | Payer: Medicare (Managed Care)

## 2024-09-09 LAB — CBC W/ AUTO DIFF
BASOPHILS ABSOLUTE COUNT: 0.1 10*9/L (ref 0.0–0.1)
BASOPHILS RELATIVE PERCENT: 0.9 %
EOSINOPHILS ABSOLUTE COUNT: 0.4 10*9/L (ref 0.0–0.5)
EOSINOPHILS RELATIVE PERCENT: 3.6 %
HEMATOCRIT: 41.5 % (ref 39.0–48.0)
HEMOGLOBIN: 13.4 g/dL (ref 12.9–16.5)
LYMPHOCYTES ABSOLUTE COUNT: 0.5 10*9/L — ABNORMAL LOW (ref 1.1–3.6)
LYMPHOCYTES RELATIVE PERCENT: 4.9 %
MEAN CORPUSCULAR HEMOGLOBIN CONC: 32.2 g/dL (ref 32.0–36.0)
MEAN CORPUSCULAR HEMOGLOBIN: 30.1 pg (ref 25.9–32.4)
MEAN CORPUSCULAR VOLUME: 93.7 fL (ref 77.6–95.7)
MEAN PLATELET VOLUME: 9.8 fL (ref 6.8–10.7)
MONOCYTES ABSOLUTE COUNT: 0.8 10*9/L (ref 0.3–0.8)
MONOCYTES RELATIVE PERCENT: 7 %
NEUTROPHILS ABSOLUTE COUNT: 9.1 10*9/L — ABNORMAL HIGH (ref 1.8–7.8)
NEUTROPHILS RELATIVE PERCENT: 83.6 %
PLATELET COUNT: 215 10*9/L (ref 150–450)
RED BLOOD CELL COUNT: 4.43 10*12/L (ref 4.26–5.60)
RED CELL DISTRIBUTION WIDTH: 16.2 % — ABNORMAL HIGH (ref 12.2–15.2)
WBC ADJUSTED: 10.8 10*9/L (ref 3.6–11.2)

## 2024-09-09 LAB — MAGNESIUM: MAGNESIUM: 1.5 mg/dL — ABNORMAL LOW (ref 1.6–2.6)

## 2024-09-09 LAB — BASIC METABOLIC PANEL
ANION GAP: 15 mmol/L — ABNORMAL HIGH (ref 5–14)
BLOOD UREA NITROGEN: 22 mg/dL (ref 9–23)
BUN / CREAT RATIO: 13
CALCIUM: 10.1 mg/dL (ref 8.7–10.4)
CHLORIDE: 107 mmol/L (ref 98–107)
CO2: 21 mmol/L (ref 20.0–31.0)
CREATININE: 1.66 mg/dL — ABNORMAL HIGH (ref 0.73–1.18)
EGFR CKD-EPI (2021) MALE: 44 mL/min/{1.73_m2} — ABNORMAL LOW (ref >=60)
GLUCOSE RANDOM: 328 mg/dL — ABNORMAL HIGH (ref 70–179)
POTASSIUM: 4.6 mmol/L (ref 3.4–4.8)
SODIUM: 143 mmol/L (ref 135–145)

## 2024-09-09 LAB — CMV DNA, QUANTITATIVE, PCR: CMV VIRAL LD: NOT DETECTED

## 2024-09-09 LAB — PHOSPHORUS: PHOSPHORUS: 2.4 mg/dL (ref 2.4–5.1)

## 2024-09-09 NOTE — Patient Instructions (Signed)
 HOW DO I PUT ON MY AFO?    Apply a long sock before donning your AFO. It is important to make sure there are no wrinkles in the sock.  Slide your foot into the brace. Make sure the heel is all the way back and the foot is seated in the brace.   Tighten the strap in the instep of the shoe first to ensure security, then tighten the shin strap. Make sure the straps are snug.  Once the straps are fastened, slide your foot into the shoe. It is important to always wear a shoe with an AFO.  Fasten your shoes. It is important to make sure the top of the shoe is snug.    Some users find that it is easier to put the AFO in the shoe first, and then slide the foot into the AFO-shoe. As long as the straps or laces are appropriately secured, experiment to determine which way may be the most convenient and effective for you.    WHAT IS THE WEAR SCHEDULE FOR MY AFO?    Just like a new pair of shoes, an AFO also has a breaking in period.    Day 1: Wear the brace for 1 hour  Day 2: Wear the brace for 2 hours  Day 3. Wear the brace for 4 hours  Day 4: Wear the brace for 8 hours    Continue this wear schedule until you reach full day use.    If your AFO is causing any redness on the skin and it does not disappear within 20 minutes, please contact your orthotist to schedule an adjustment.    Please remember to always wear your shoe with your AFO for proper support.    HOW DO I CLEAN MY AFO?  Spray rubbing alcohol on inside and outside of your AFO and wipe it down with a cloth to remove any oils or residue.  It is important to clean any foam padding about once a week to prevent build-up.  Always check the integrity of the Velcro straps and check for build-ups    WHAT IF MY AFO DOES NOT FIT INTO MY SHOE?  Check to see if the heel is all the way back into the shoe  Look to see if the back of the shoe is crushed which could cause the AFO to hang up  Remove the insole of the shoe to accommodate the AFO  Make sure you are wear proper footwear at all times on both feet.    WHAT ARE SOME PRECAUTIONS I SHOULD TAKE WITH MY AFO?  Never wear your AFO without shoes, it will increase your risk of falls  Make sure you are following the initial wear schedule of the AFO to avoid skin irritation  Be aware of volume changes of your body that could affect the fit of your AFO  Do not sleep in your AFO  Different styles of shoes can affect how your standing and walking balance      Please contact our clinic if you develop any signs of redness, discomfort, or any other concerns regarding your brace    Please call: [phone]

## 2024-09-09 NOTE — Progress Notes (Signed)
 [facility] [place]  Prosthetics & Orthotics  [phone]     Lower Extremity Orthotics Delivery      HPI   SUBJECTIVE:   Frank Leach is a 74 y.o. male who presents for delivery of right custom solid AFO. Accompanied by spouse. Arrived using clinic WC.  Had Neurosurgery appointment today in the [facility] building. Patient requested to be seen today as they were originally scheduled for 2/6. They were seen as a walk-in in [facility] today.    History     Changes since last visit:  no significant changes reported    Actions taken today     Device Data    Base Device  Quantity: 1  Side: Right  Size: Custom  Description: Solid ankle foot orthosis  Supplier: [employer]  Warranty: 90 Days  Action: Delivered      Brace Design:    [x]  Custom Solid AFO  []  Spiral AFO  []  Dorsiflexion Assist AFO    []  Custom Articulated AFO []  Floor Reaction    []  Custom Leaf Spring AFO []  Prefabricated Leaf Spring AFO    Modifications made today:   Size  []  - footplate length trimmed  - spot relieved apex of medial malleolus   Alignment []  - added 1/8 puff padding along MLA area   Other []  - heated and flared lateral trimline proximal to 5th met head     Any additions or modifications to the standard:     Any additional follow up needed:        Goals     Short Term:  Support the lower extremities to allow for standing and walking  Improve balance (reduce falls)  Wear device full time during walking hours  Improve ankle joint positioning  Long Term:  Stabilize ankle joint during standing/walking  Reduce compensatory gait patterns  Prevent skin irritation or discomfort  Patient Goals:  Participate in more therapy activities  Perform more tasks independently  Increase time with standing/walking      Assessment   Assessment: Frank Leach was seen for fitting and delivery of custom right SAFO. Pmhx of right foot drop. SAFO was fit and adjusted as described above. They did not have footwear that would fit well over AFO today. Reviewed recommended footwear with AFO and informed that AFO should be worn with socks and shoes. Patient denied any areas of discomfort while wearing AFO. Patient presents with pronated alignment at baseline. AFO was adjusted to avoid excessive pressure along medial malleolus. Moderate gapping along the calf noted but does not seem problematic. Reviewed adjustments with patient and patient's spouse and possibility for additional padding if needed to resist valgus. Reviewed a gradual break-in process, regular skin checks, and encouraged them to reach out if any adjustments are needed. Patient and spouse confirmed understanding.    Safety Check:    Brace was checked for safety / security    Checked for loose / sharp components    Patient and/or caregiver are able to safely use and operate the device     Counseled patient about brace wear and care, in particular we reviewed:     [x]  Device purpose   [x]  How to properly put on / take off  [x]  Proper wear and care   [x]  Cleaning instructions   [x]  Safety precautions and risks   [x]  How to report problems          Plan    Return Plan:  as needed  Additional fabrication / components outstanding from today's visit:  no

## 2024-09-10 LAB — TACROLIMUS LEVEL, TROUGH: TACROLIMUS, TROUGH: 9 ng/mL (ref 5.0–15.0)
